# Patient Record
Sex: Male | Born: 1935 | Race: White | Hispanic: No | Marital: Married | Smoking: Former smoker
Health system: Southern US, Community
[De-identification: ages and names within clinical notes are randomized; demographics above are authoritative.]

## PROBLEM LIST (undated history)

## (undated) DIAGNOSIS — C4490 Unspecified malignant neoplasm of skin, unspecified: Secondary | ICD-10-CM

## (undated) DIAGNOSIS — L02419 Cutaneous abscess of limb, unspecified: Secondary | ICD-10-CM

## (undated) DIAGNOSIS — Z923 Personal history of irradiation: Secondary | ICD-10-CM

## (undated) DIAGNOSIS — E119 Type 2 diabetes mellitus without complications: Secondary | ICD-10-CM

## (undated) DIAGNOSIS — M109 Gout, unspecified: Secondary | ICD-10-CM

## (undated) DIAGNOSIS — R5383 Other fatigue: Secondary | ICD-10-CM

## (undated) DIAGNOSIS — K219 Gastro-esophageal reflux disease without esophagitis: Secondary | ICD-10-CM

## (undated) DIAGNOSIS — Z86010 Personal history of colonic polyps: Secondary | ICD-10-CM

## (undated) DIAGNOSIS — I872 Venous insufficiency (chronic) (peripheral): Secondary | ICD-10-CM

## (undated) DIAGNOSIS — I495 Sick sinus syndrome: Secondary | ICD-10-CM

## (undated) DIAGNOSIS — L03119 Cellulitis of unspecified part of limb: Secondary | ICD-10-CM

## (undated) DIAGNOSIS — I8000 Phlebitis and thrombophlebitis of superficial vessels of unspecified lower extremity: Secondary | ICD-10-CM

## (undated) DIAGNOSIS — E663 Overweight: Secondary | ICD-10-CM

## (undated) DIAGNOSIS — Z7901 Long term (current) use of anticoagulants: Secondary | ICD-10-CM

## (undated) DIAGNOSIS — M549 Dorsalgia, unspecified: Secondary | ICD-10-CM

## (undated) DIAGNOSIS — I4891 Unspecified atrial fibrillation: Secondary | ICD-10-CM

## (undated) DIAGNOSIS — I4510 Unspecified right bundle-branch block: Secondary | ICD-10-CM

## (undated) DIAGNOSIS — K5790 Diverticulosis of intestine, part unspecified, without perforation or abscess without bleeding: Secondary | ICD-10-CM

## (undated) DIAGNOSIS — Z950 Presence of cardiac pacemaker: Secondary | ICD-10-CM

## (undated) DIAGNOSIS — I509 Heart failure, unspecified: Secondary | ICD-10-CM

## (undated) DIAGNOSIS — I10 Essential (primary) hypertension: Secondary | ICD-10-CM

## (undated) DIAGNOSIS — R55 Syncope and collapse: Secondary | ICD-10-CM

## (undated) DIAGNOSIS — K222 Esophageal obstruction: Secondary | ICD-10-CM

## (undated) HISTORY — PX: LUMBAR FUSION: SHX111

## (undated) HISTORY — DX: Cellulitis of unspecified part of limb: L02.419

## (undated) HISTORY — DX: Gastro-esophageal reflux disease without esophagitis: K21.9

## (undated) HISTORY — PX: OTHER SURGICAL HISTORY: SHX169

## (undated) HISTORY — DX: Long term (current) use of anticoagulants: Z79.01

## (undated) HISTORY — PX: SKIN BIOPSY: SHX1

## (undated) HISTORY — DX: Type 2 diabetes mellitus without complications: E11.9

## (undated) HISTORY — DX: Sick sinus syndrome: I49.5

## (undated) HISTORY — DX: Dorsalgia, unspecified: M54.9

## (undated) HISTORY — PX: LAMINECTOMY: SHX219

## (undated) HISTORY — DX: Essential (primary) hypertension: I10

## (undated) HISTORY — DX: Other fatigue: R53.83

## (undated) HISTORY — DX: Syncope and collapse: R55

## (undated) HISTORY — DX: Personal history of irradiation: Z92.3

## (undated) HISTORY — DX: Esophageal obstruction: K22.2

## (undated) HISTORY — PX: ROTATOR CUFF REPAIR: SHX139

## (undated) HISTORY — DX: Unspecified malignant neoplasm of skin, unspecified: C44.90

## (undated) HISTORY — DX: Overweight: E66.3

## (undated) HISTORY — DX: Unspecified right bundle-branch block: I45.10

## (undated) HISTORY — DX: Cellulitis of unspecified part of limb: L03.119

## (undated) HISTORY — DX: Gout, unspecified: M10.9

## (undated) HISTORY — DX: Diverticulosis of intestine, part unspecified, without perforation or abscess without bleeding: K57.90

## (undated) HISTORY — DX: Presence of cardiac pacemaker: Z95.0

## (undated) HISTORY — DX: Personal history of colonic polyps: Z86.010

## (undated) HISTORY — DX: Venous insufficiency (chronic) (peripheral): I87.2

## (undated) HISTORY — DX: Phlebitis and thrombophlebitis of superficial vessels of unspecified lower extremity: I80.00

## (undated) HISTORY — PX: EYE SURGERY: SHX253

## (undated) HISTORY — DX: Unspecified atrial fibrillation: I48.91

---

## 2000-04-30 ENCOUNTER — Encounter: Payer: Self-pay | Admitting: Emergency Medicine

## 2000-04-30 ENCOUNTER — Emergency Department (HOSPITAL_COMMUNITY): Admission: EM | Admit: 2000-04-30 | Discharge: 2000-04-30 | Payer: Self-pay | Admitting: Emergency Medicine

## 2000-12-02 ENCOUNTER — Encounter: Payer: Self-pay | Admitting: *Deleted

## 2000-12-02 ENCOUNTER — Emergency Department (HOSPITAL_COMMUNITY): Admission: EM | Admit: 2000-12-02 | Discharge: 2000-12-03 | Payer: Self-pay | Admitting: *Deleted

## 2002-07-14 ENCOUNTER — Encounter: Payer: Self-pay | Admitting: Emergency Medicine

## 2002-07-14 ENCOUNTER — Emergency Department (HOSPITAL_COMMUNITY): Admission: EM | Admit: 2002-07-14 | Discharge: 2002-07-14 | Payer: Self-pay | Admitting: Emergency Medicine

## 2002-07-16 ENCOUNTER — Encounter: Payer: Self-pay | Admitting: Gastroenterology

## 2002-07-16 ENCOUNTER — Ambulatory Visit (HOSPITAL_COMMUNITY): Admission: RE | Admit: 2002-07-16 | Discharge: 2002-07-16 | Payer: Self-pay | Admitting: Gastroenterology

## 2003-06-25 ENCOUNTER — Encounter: Payer: Self-pay | Admitting: Emergency Medicine

## 2003-06-25 ENCOUNTER — Emergency Department (HOSPITAL_COMMUNITY): Admission: EM | Admit: 2003-06-25 | Discharge: 2003-06-26 | Payer: Self-pay | Admitting: Emergency Medicine

## 2004-03-01 ENCOUNTER — Ambulatory Visit (HOSPITAL_COMMUNITY): Admission: RE | Admit: 2004-03-01 | Discharge: 2004-03-01 | Payer: Self-pay | Admitting: Internal Medicine

## 2004-03-30 ENCOUNTER — Observation Stay (HOSPITAL_COMMUNITY): Admission: EM | Admit: 2004-03-30 | Discharge: 2004-03-31 | Payer: Self-pay | Admitting: Emergency Medicine

## 2004-06-24 ENCOUNTER — Inpatient Hospital Stay (HOSPITAL_COMMUNITY): Admission: RE | Admit: 2004-06-24 | Discharge: 2004-06-28 | Payer: Self-pay | Admitting: Neurosurgery

## 2004-07-05 ENCOUNTER — Inpatient Hospital Stay (HOSPITAL_COMMUNITY): Admission: AD | Admit: 2004-07-05 | Discharge: 2004-07-20 | Payer: Self-pay | Admitting: Neurosurgery

## 2004-07-13 ENCOUNTER — Ambulatory Visit: Payer: Self-pay | Admitting: Infectious Diseases

## 2004-07-24 ENCOUNTER — Inpatient Hospital Stay (HOSPITAL_COMMUNITY): Admission: EM | Admit: 2004-07-24 | Discharge: 2004-08-03 | Payer: Self-pay | Admitting: Emergency Medicine

## 2004-07-27 ENCOUNTER — Encounter: Payer: Self-pay | Admitting: Cardiology

## 2004-07-29 ENCOUNTER — Ambulatory Visit: Payer: Self-pay | Admitting: Physical Medicine & Rehabilitation

## 2004-10-01 ENCOUNTER — Ambulatory Visit: Payer: Self-pay | Admitting: Internal Medicine

## 2004-10-15 ENCOUNTER — Ambulatory Visit: Payer: Self-pay | Admitting: Internal Medicine

## 2004-10-20 ENCOUNTER — Ambulatory Visit: Payer: Self-pay | Admitting: Internal Medicine

## 2004-12-07 ENCOUNTER — Ambulatory Visit: Payer: Self-pay | Admitting: Internal Medicine

## 2004-12-21 ENCOUNTER — Ambulatory Visit: Payer: Self-pay | Admitting: Internal Medicine

## 2004-12-23 ENCOUNTER — Ambulatory Visit: Payer: Self-pay | Admitting: Internal Medicine

## 2005-01-04 ENCOUNTER — Ambulatory Visit: Payer: Self-pay | Admitting: Internal Medicine

## 2005-06-10 ENCOUNTER — Ambulatory Visit: Payer: Self-pay | Admitting: Internal Medicine

## 2005-06-16 ENCOUNTER — Encounter: Admission: RE | Admit: 2005-06-16 | Discharge: 2005-07-28 | Payer: Self-pay | Admitting: Internal Medicine

## 2005-07-02 ENCOUNTER — Ambulatory Visit: Payer: Self-pay | Admitting: Internal Medicine

## 2005-07-02 ENCOUNTER — Inpatient Hospital Stay (HOSPITAL_COMMUNITY): Admission: EM | Admit: 2005-07-02 | Discharge: 2005-07-06 | Payer: Self-pay | Admitting: Family Medicine

## 2005-07-07 ENCOUNTER — Ambulatory Visit: Payer: Self-pay | Admitting: Internal Medicine

## 2005-07-12 ENCOUNTER — Ambulatory Visit: Payer: Self-pay | Admitting: Internal Medicine

## 2005-07-20 ENCOUNTER — Ambulatory Visit: Payer: Self-pay | Admitting: Internal Medicine

## 2005-07-26 ENCOUNTER — Ambulatory Visit: Payer: Self-pay | Admitting: Internal Medicine

## 2005-07-28 ENCOUNTER — Ambulatory Visit: Payer: Self-pay | Admitting: Internal Medicine

## 2005-08-09 ENCOUNTER — Ambulatory Visit: Payer: Self-pay | Admitting: Internal Medicine

## 2005-08-24 ENCOUNTER — Ambulatory Visit: Payer: Self-pay | Admitting: Internal Medicine

## 2005-09-22 ENCOUNTER — Ambulatory Visit: Payer: Self-pay | Admitting: Internal Medicine

## 2005-09-30 ENCOUNTER — Ambulatory Visit (HOSPITAL_COMMUNITY): Admission: RE | Admit: 2005-09-30 | Discharge: 2005-09-30 | Payer: Self-pay | Admitting: Internal Medicine

## 2005-09-30 ENCOUNTER — Ambulatory Visit: Payer: Self-pay | Admitting: Internal Medicine

## 2005-10-03 ENCOUNTER — Ambulatory Visit: Payer: Self-pay | Admitting: Internal Medicine

## 2005-10-07 ENCOUNTER — Ambulatory Visit: Payer: Self-pay | Admitting: Internal Medicine

## 2005-10-10 ENCOUNTER — Ambulatory Visit: Payer: Self-pay | Admitting: Internal Medicine

## 2005-10-11 ENCOUNTER — Ambulatory Visit: Payer: Self-pay | Admitting: Internal Medicine

## 2005-10-12 ENCOUNTER — Ambulatory Visit: Payer: Self-pay | Admitting: Internal Medicine

## 2005-10-18 ENCOUNTER — Ambulatory Visit: Payer: Self-pay | Admitting: Internal Medicine

## 2005-10-26 ENCOUNTER — Ambulatory Visit: Payer: Self-pay | Admitting: Internal Medicine

## 2005-12-15 ENCOUNTER — Ambulatory Visit: Payer: Self-pay | Admitting: Internal Medicine

## 2006-01-05 ENCOUNTER — Ambulatory Visit: Payer: Self-pay | Admitting: Internal Medicine

## 2006-01-27 ENCOUNTER — Ambulatory Visit: Payer: Self-pay | Admitting: Internal Medicine

## 2006-03-22 ENCOUNTER — Encounter: Payer: Self-pay | Admitting: Emergency Medicine

## 2006-04-20 ENCOUNTER — Ambulatory Visit: Payer: Self-pay | Admitting: Internal Medicine

## 2006-04-27 ENCOUNTER — Ambulatory Visit: Payer: Self-pay | Admitting: Internal Medicine

## 2006-05-12 ENCOUNTER — Ambulatory Visit: Payer: Self-pay | Admitting: Internal Medicine

## 2006-05-16 ENCOUNTER — Ambulatory Visit: Payer: Self-pay | Admitting: Internal Medicine

## 2006-05-21 DIAGNOSIS — Z860100 Personal history of colon polyps, unspecified: Secondary | ICD-10-CM

## 2006-05-21 DIAGNOSIS — K5790 Diverticulosis of intestine, part unspecified, without perforation or abscess without bleeding: Secondary | ICD-10-CM

## 2006-05-21 HISTORY — DX: Personal history of colonic polyps: Z86.010

## 2006-05-21 HISTORY — DX: Personal history of colon polyps, unspecified: Z86.0100

## 2006-05-21 HISTORY — DX: Diverticulosis of intestine, part unspecified, without perforation or abscess without bleeding: K57.90

## 2006-05-25 ENCOUNTER — Ambulatory Visit: Payer: Self-pay | Admitting: Internal Medicine

## 2006-05-26 ENCOUNTER — Ambulatory Visit: Payer: Self-pay | Admitting: Gastroenterology

## 2006-06-01 ENCOUNTER — Ambulatory Visit: Payer: Self-pay | Admitting: Gastroenterology

## 2006-06-01 ENCOUNTER — Encounter (INDEPENDENT_AMBULATORY_CARE_PROVIDER_SITE_OTHER): Payer: Self-pay | Admitting: Specialist

## 2006-06-01 ENCOUNTER — Encounter (INDEPENDENT_AMBULATORY_CARE_PROVIDER_SITE_OTHER): Payer: Self-pay | Admitting: *Deleted

## 2006-06-23 ENCOUNTER — Ambulatory Visit: Payer: Self-pay | Admitting: Internal Medicine

## 2006-07-21 ENCOUNTER — Ambulatory Visit: Payer: Self-pay | Admitting: Internal Medicine

## 2006-07-27 ENCOUNTER — Ambulatory Visit: Payer: Self-pay | Admitting: Internal Medicine

## 2006-10-26 ENCOUNTER — Ambulatory Visit: Payer: Self-pay | Admitting: Internal Medicine

## 2006-10-26 LAB — CONVERTED CEMR LAB
ALT: 26 units/L (ref 0–40)
AST: 23 units/L (ref 0–37)
Albumin: 3.6 g/dL (ref 3.5–5.2)
Alkaline Phosphatase: 97 units/L (ref 39–117)
Basophils Absolute: 0 10*3/uL (ref 0.0–0.1)
CO2: 29 meq/L (ref 19–32)
Calcium: 9.1 mg/dL (ref 8.4–10.5)
Chloride: 93 meq/L — ABNORMAL LOW (ref 96–112)
GFR calc non Af Amer: 58 mL/min
Glucose, Bld: 358 mg/dL — ABNORMAL HIGH (ref 70–99)
Lymphocytes Relative: 18.9 % (ref 12.0–46.0)
MCHC: 34.5 g/dL (ref 30.0–36.0)
MCV: 98.3 fL (ref 78.0–100.0)
Monocytes Absolute: 0.6 10*3/uL (ref 0.2–0.7)
Monocytes Relative: 6.8 % (ref 3.0–11.0)
Neutro Abs: 5.8 10*3/uL (ref 1.4–7.7)
Platelets: 170 10*3/uL (ref 150–400)
RDW: 12.7 % (ref 11.5–14.6)
Total Bilirubin: 1.9 mg/dL — ABNORMAL HIGH (ref 0.3–1.2)

## 2006-10-30 ENCOUNTER — Ambulatory Visit: Payer: Self-pay | Admitting: Internal Medicine

## 2006-12-17 ENCOUNTER — Inpatient Hospital Stay (HOSPITAL_COMMUNITY): Admission: EM | Admit: 2006-12-17 | Discharge: 2006-12-21 | Payer: Self-pay | Admitting: Emergency Medicine

## 2006-12-17 ENCOUNTER — Ambulatory Visit: Payer: Self-pay | Admitting: Cardiology

## 2006-12-18 ENCOUNTER — Ambulatory Visit: Payer: Self-pay | Admitting: Internal Medicine

## 2006-12-18 ENCOUNTER — Encounter: Payer: Self-pay | Admitting: Cardiology

## 2006-12-26 ENCOUNTER — Ambulatory Visit: Payer: Self-pay | Admitting: Internal Medicine

## 2006-12-26 LAB — CONVERTED CEMR LAB
BUN: 16 mg/dL (ref 6–23)
CO2: 28 meq/L (ref 19–32)
Calcium: 9.6 mg/dL (ref 8.4–10.5)
Chloride: 95 meq/L — ABNORMAL LOW (ref 96–112)
Creatinine, Ser: 1 mg/dL (ref 0.4–1.5)
Glucose, Bld: 298 mg/dL — ABNORMAL HIGH (ref 70–99)

## 2006-12-29 ENCOUNTER — Ambulatory Visit: Payer: Self-pay | Admitting: *Deleted

## 2007-01-01 ENCOUNTER — Ambulatory Visit: Payer: Self-pay

## 2007-01-03 ENCOUNTER — Ambulatory Visit (HOSPITAL_COMMUNITY): Admission: RE | Admit: 2007-01-03 | Discharge: 2007-01-03 | Payer: Self-pay | Admitting: Otolaryngology

## 2007-01-24 ENCOUNTER — Ambulatory Visit: Payer: Self-pay | Admitting: Internal Medicine

## 2007-02-06 ENCOUNTER — Encounter: Admission: RE | Admit: 2007-02-06 | Discharge: 2007-02-06 | Payer: Self-pay | Admitting: Otolaryngology

## 2007-02-14 ENCOUNTER — Ambulatory Visit: Payer: Self-pay | Admitting: Internal Medicine

## 2007-03-19 ENCOUNTER — Ambulatory Visit: Payer: Self-pay | Admitting: Internal Medicine

## 2007-04-06 ENCOUNTER — Ambulatory Visit: Payer: Self-pay | Admitting: Internal Medicine

## 2007-04-10 ENCOUNTER — Ambulatory Visit: Payer: Self-pay | Admitting: Internal Medicine

## 2007-04-13 ENCOUNTER — Ambulatory Visit: Payer: Self-pay | Admitting: Internal Medicine

## 2007-04-18 ENCOUNTER — Ambulatory Visit: Payer: Self-pay | Admitting: Internal Medicine

## 2007-04-18 LAB — CONVERTED CEMR LAB
BUN: 9 mg/dL (ref 6–23)
Calcium: 9 mg/dL (ref 8.4–10.5)
Chloride: 94 meq/L — ABNORMAL LOW (ref 96–112)
GFR calc Af Amer: 70 mL/min
Glucose, Bld: 284 mg/dL — ABNORMAL HIGH (ref 70–99)
Sodium: 131 meq/L — ABNORMAL LOW (ref 135–145)

## 2007-04-19 ENCOUNTER — Ambulatory Visit: Payer: Self-pay | Admitting: Internal Medicine

## 2007-05-18 ENCOUNTER — Ambulatory Visit: Payer: Self-pay | Admitting: Internal Medicine

## 2007-05-23 ENCOUNTER — Ambulatory Visit: Payer: Self-pay | Admitting: Internal Medicine

## 2007-06-11 ENCOUNTER — Telehealth: Payer: Self-pay | Admitting: *Deleted

## 2007-06-26 DIAGNOSIS — I4891 Unspecified atrial fibrillation: Secondary | ICD-10-CM | POA: Insufficient documentation

## 2007-06-26 DIAGNOSIS — I10 Essential (primary) hypertension: Secondary | ICD-10-CM

## 2007-06-26 DIAGNOSIS — M109 Gout, unspecified: Secondary | ICD-10-CM

## 2007-06-26 DIAGNOSIS — I5032 Chronic diastolic (congestive) heart failure: Secondary | ICD-10-CM

## 2007-07-02 ENCOUNTER — Ambulatory Visit: Payer: Self-pay | Admitting: Internal Medicine

## 2007-07-02 DIAGNOSIS — I872 Venous insufficiency (chronic) (peripheral): Secondary | ICD-10-CM | POA: Insufficient documentation

## 2007-07-24 ENCOUNTER — Ambulatory Visit: Payer: Self-pay | Admitting: Internal Medicine

## 2007-07-24 DIAGNOSIS — E119 Type 2 diabetes mellitus without complications: Secondary | ICD-10-CM | POA: Insufficient documentation

## 2007-07-25 LAB — CONVERTED CEMR LAB
Calcium: 8.6 mg/dL (ref 8.4–10.5)
Chloride: 96 meq/L (ref 96–112)
Potassium: 4.6 meq/L (ref 3.5–5.1)

## 2007-07-31 ENCOUNTER — Telehealth: Payer: Self-pay | Admitting: *Deleted

## 2007-08-01 ENCOUNTER — Ambulatory Visit: Payer: Self-pay | Admitting: Internal Medicine

## 2007-08-01 LAB — CONVERTED CEMR LAB
Basophils Absolute: 0 10*3/uL (ref 0.0–0.1)
Chloride: 95 meq/L — ABNORMAL LOW (ref 96–112)
Glucose, Bld: 351 mg/dL — ABNORMAL HIGH (ref 70–99)
Hemoglobin: 14.5 g/dL (ref 13.0–17.0)
MCV: 98.3 fL (ref 78.0–100.0)
Neutro Abs: 4.9 10*3/uL (ref 1.4–7.7)
Potassium: 4.3 meq/L (ref 3.5–5.1)
RBC: 4.15 M/uL — ABNORMAL LOW (ref 4.22–5.81)
RDW: 13.4 % (ref 11.5–14.6)
Sodium: 136 meq/L (ref 135–145)

## 2007-08-07 ENCOUNTER — Encounter: Payer: Self-pay | Admitting: Internal Medicine

## 2007-09-03 ENCOUNTER — Ambulatory Visit: Payer: Self-pay | Admitting: Internal Medicine

## 2007-09-06 ENCOUNTER — Telehealth: Payer: Self-pay | Admitting: Internal Medicine

## 2007-09-10 ENCOUNTER — Ambulatory Visit: Payer: Self-pay | Admitting: Internal Medicine

## 2007-09-10 ENCOUNTER — Ambulatory Visit: Payer: Self-pay

## 2007-09-10 ENCOUNTER — Encounter: Payer: Self-pay | Admitting: Internal Medicine

## 2007-09-27 ENCOUNTER — Ambulatory Visit: Payer: Self-pay | Admitting: Cardiology

## 2007-10-03 ENCOUNTER — Ambulatory Visit: Payer: Self-pay | Admitting: Internal Medicine

## 2007-10-08 ENCOUNTER — Telehealth: Payer: Self-pay | Admitting: Internal Medicine

## 2007-10-11 ENCOUNTER — Ambulatory Visit: Payer: Self-pay | Admitting: Internal Medicine

## 2007-10-11 LAB — CONVERTED CEMR LAB: INR: 1.5

## 2007-10-31 ENCOUNTER — Ambulatory Visit: Payer: Self-pay | Admitting: Internal Medicine

## 2007-10-31 DIAGNOSIS — M549 Dorsalgia, unspecified: Secondary | ICD-10-CM | POA: Insufficient documentation

## 2007-11-26 ENCOUNTER — Telehealth: Payer: Self-pay | Admitting: Internal Medicine

## 2007-11-26 ENCOUNTER — Telehealth (INDEPENDENT_AMBULATORY_CARE_PROVIDER_SITE_OTHER): Payer: Self-pay | Admitting: *Deleted

## 2007-11-27 ENCOUNTER — Encounter: Payer: Self-pay | Admitting: Internal Medicine

## 2007-11-29 ENCOUNTER — Encounter: Payer: Self-pay | Admitting: Internal Medicine

## 2007-12-06 ENCOUNTER — Ambulatory Visit: Payer: Self-pay | Admitting: Internal Medicine

## 2007-12-11 LAB — CONVERTED CEMR LAB
Calcium: 9.3 mg/dL (ref 8.4–10.5)
Chloride: 92 meq/L — ABNORMAL LOW (ref 96–112)
GFR calc Af Amer: 85 mL/min
Glucose, Bld: 525 mg/dL (ref 70–99)
Potassium: 5.2 meq/L — ABNORMAL HIGH (ref 3.5–5.1)
Sodium: 132 meq/L — ABNORMAL LOW (ref 135–145)

## 2007-12-26 ENCOUNTER — Ambulatory Visit: Payer: Self-pay | Admitting: Internal Medicine

## 2007-12-26 ENCOUNTER — Telehealth: Payer: Self-pay | Admitting: Internal Medicine

## 2007-12-27 ENCOUNTER — Ambulatory Visit: Payer: Self-pay | Admitting: Internal Medicine

## 2007-12-27 LAB — CONVERTED CEMR LAB
GFR calc non Af Amer: 64 mL/min
Glucose, Bld: 455 mg/dL — ABNORMAL HIGH (ref 70–99)
Hgb A1c MFr Bld: 12.6 % — ABNORMAL HIGH (ref 4.6–6.0)
Potassium: 4.8 meq/L (ref 3.5–5.1)
Sodium: 133 meq/L — ABNORMAL LOW (ref 135–145)

## 2007-12-31 ENCOUNTER — Telehealth: Payer: Self-pay | Admitting: Internal Medicine

## 2008-01-04 ENCOUNTER — Telehealth: Payer: Self-pay | Admitting: Internal Medicine

## 2008-01-10 ENCOUNTER — Telehealth: Payer: Self-pay | Admitting: Internal Medicine

## 2008-01-16 ENCOUNTER — Telehealth: Payer: Self-pay | Admitting: Internal Medicine

## 2008-01-18 ENCOUNTER — Inpatient Hospital Stay (HOSPITAL_COMMUNITY): Admission: EM | Admit: 2008-01-18 | Discharge: 2008-01-22 | Payer: Self-pay | Admitting: Emergency Medicine

## 2008-01-18 ENCOUNTER — Ambulatory Visit: Payer: Self-pay | Admitting: Internal Medicine

## 2008-01-25 ENCOUNTER — Ambulatory Visit: Payer: Self-pay | Admitting: Internal Medicine

## 2008-02-01 ENCOUNTER — Telehealth: Payer: Self-pay | Admitting: Internal Medicine

## 2008-02-06 ENCOUNTER — Ambulatory Visit: Payer: Self-pay | Admitting: Cardiology

## 2008-02-08 ENCOUNTER — Telehealth: Payer: Self-pay | Admitting: Internal Medicine

## 2008-02-13 ENCOUNTER — Telehealth (INDEPENDENT_AMBULATORY_CARE_PROVIDER_SITE_OTHER): Payer: Self-pay | Admitting: *Deleted

## 2008-02-18 ENCOUNTER — Ambulatory Visit: Payer: Self-pay | Admitting: Internal Medicine

## 2008-02-18 LAB — CONVERTED CEMR LAB
BUN: 12 mg/dL (ref 6–23)
Calcium: 9.1 mg/dL (ref 8.4–10.5)
Creatinine, Ser: 0.8 mg/dL (ref 0.4–1.5)
GFR calc Af Amer: 123 mL/min
GFR calc non Af Amer: 102 mL/min
Glucose, Bld: 221 mg/dL — ABNORMAL HIGH (ref 70–99)
Potassium: 4.2 meq/L (ref 3.5–5.1)

## 2008-02-20 LAB — HM DIABETES EYE EXAM

## 2008-02-27 ENCOUNTER — Telehealth: Payer: Self-pay | Admitting: Internal Medicine

## 2008-02-28 ENCOUNTER — Encounter: Payer: Self-pay | Admitting: Internal Medicine

## 2008-02-28 ENCOUNTER — Ambulatory Visit: Payer: Self-pay | Admitting: Internal Medicine

## 2008-02-28 DIAGNOSIS — I8000 Phlebitis and thrombophlebitis of superficial vessels of unspecified lower extremity: Secondary | ICD-10-CM | POA: Insufficient documentation

## 2008-02-28 DIAGNOSIS — L02419 Cutaneous abscess of limb, unspecified: Secondary | ICD-10-CM

## 2008-02-28 DIAGNOSIS — L03119 Cellulitis of unspecified part of limb: Secondary | ICD-10-CM

## 2008-02-28 LAB — CONVERTED CEMR LAB
BUN: 16 mg/dL (ref 6–23)
CO2: 33 meq/L — ABNORMAL HIGH (ref 19–32)
Calcium: 8.9 mg/dL (ref 8.4–10.5)
Chloride: 97 meq/L (ref 96–112)
Creatinine, Ser: 0.8 mg/dL (ref 0.4–1.5)
Glucose, Bld: 201 mg/dL — ABNORMAL HIGH (ref 70–99)
Hgb A1c MFr Bld: 9.1 % — ABNORMAL HIGH (ref 4.6–6.0)

## 2008-03-14 ENCOUNTER — Ambulatory Visit: Payer: Self-pay | Admitting: Internal Medicine

## 2008-04-15 ENCOUNTER — Ambulatory Visit: Payer: Self-pay | Admitting: Internal Medicine

## 2008-04-15 LAB — CONVERTED CEMR LAB
BUN: 12 mg/dL (ref 6–23)
Calcium: 9.2 mg/dL (ref 8.4–10.5)
Eosinophils Absolute: 0.1 10*3/uL (ref 0.0–0.7)
GFR calc Af Amer: 95 mL/min
GFR calc non Af Amer: 79 mL/min
Glucose, Bld: 290 mg/dL — ABNORMAL HIGH (ref 70–99)
HCT: 40.8 % (ref 39.0–52.0)
Hemoglobin: 14.1 g/dL (ref 13.0–17.0)
MCV: 95.2 fL (ref 78.0–100.0)
Monocytes Absolute: 0.5 10*3/uL (ref 0.1–1.0)
Monocytes Relative: 5.8 % (ref 3.0–12.0)
Neutro Abs: 6.1 10*3/uL (ref 1.4–7.7)
Platelets: 164 10*3/uL (ref 150–400)
RDW: 13.4 % (ref 11.5–14.6)
Sodium: 138 meq/L (ref 135–145)

## 2008-05-02 ENCOUNTER — Ambulatory Visit: Payer: Self-pay | Admitting: Internal Medicine

## 2008-05-19 ENCOUNTER — Telehealth (INDEPENDENT_AMBULATORY_CARE_PROVIDER_SITE_OTHER): Payer: Self-pay | Admitting: *Deleted

## 2008-05-21 ENCOUNTER — Telehealth: Payer: Self-pay | Admitting: Internal Medicine

## 2008-05-22 ENCOUNTER — Ambulatory Visit: Payer: Self-pay | Admitting: Internal Medicine

## 2008-05-22 LAB — CONVERTED CEMR LAB
BUN: 9 mg/dL (ref 6–23)
Creatinine, Ser: 0.9 mg/dL (ref 0.4–1.5)
GFR calc Af Amer: 107 mL/min
GFR calc non Af Amer: 89 mL/min
Potassium: 3 meq/L — ABNORMAL LOW (ref 3.5–5.1)

## 2008-06-16 ENCOUNTER — Ambulatory Visit: Payer: Self-pay | Admitting: Internal Medicine

## 2008-08-04 ENCOUNTER — Telehealth: Payer: Self-pay | Admitting: Internal Medicine

## 2008-08-13 ENCOUNTER — Ambulatory Visit: Payer: Self-pay | Admitting: Internal Medicine

## 2008-08-13 LAB — CONVERTED CEMR LAB
BUN: 10 mg/dL (ref 6–23)
Calcium: 9.1 mg/dL (ref 8.4–10.5)
GFR calc Af Amer: 107 mL/min
Glucose, Bld: 233 mg/dL — ABNORMAL HIGH (ref 70–99)
Prothrombin Time: 17.7 s

## 2008-08-22 ENCOUNTER — Ambulatory Visit: Payer: Self-pay | Admitting: Internal Medicine

## 2008-10-20 ENCOUNTER — Telehealth: Payer: Self-pay | Admitting: Internal Medicine

## 2008-10-30 ENCOUNTER — Telehealth: Payer: Self-pay | Admitting: Internal Medicine

## 2008-11-11 ENCOUNTER — Ambulatory Visit: Payer: Self-pay | Admitting: Internal Medicine

## 2009-01-20 ENCOUNTER — Ambulatory Visit: Payer: Self-pay | Admitting: Internal Medicine

## 2009-01-21 ENCOUNTER — Encounter: Payer: Self-pay | Admitting: Internal Medicine

## 2009-01-22 ENCOUNTER — Telehealth: Payer: Self-pay | Admitting: Internal Medicine

## 2009-01-27 ENCOUNTER — Encounter: Admission: RE | Admit: 2009-01-27 | Discharge: 2009-03-04 | Payer: Self-pay | Admitting: Neurology

## 2009-01-30 ENCOUNTER — Ambulatory Visit: Payer: Self-pay | Admitting: Internal Medicine

## 2009-01-30 LAB — CONVERTED CEMR LAB
BUN: 10 mg/dL (ref 6–23)
Calcium: 9.2 mg/dL (ref 8.4–10.5)
Creatinine, Ser: 0.7 mg/dL (ref 0.4–1.5)
GFR calc Af Amer: 143 mL/min
Glucose, Bld: 214 mg/dL — ABNORMAL HIGH (ref 70–99)

## 2009-02-02 ENCOUNTER — Encounter: Admission: RE | Admit: 2009-02-02 | Discharge: 2009-02-02 | Payer: Self-pay | Admitting: Neurology

## 2009-02-11 ENCOUNTER — Telehealth: Payer: Self-pay | Admitting: Internal Medicine

## 2009-03-09 ENCOUNTER — Telehealth: Payer: Self-pay | Admitting: Internal Medicine

## 2009-03-19 ENCOUNTER — Ambulatory Visit: Payer: Self-pay | Admitting: Internal Medicine

## 2009-04-07 DIAGNOSIS — I4510 Unspecified right bundle-branch block: Secondary | ICD-10-CM

## 2009-04-07 DIAGNOSIS — K219 Gastro-esophageal reflux disease without esophagitis: Secondary | ICD-10-CM | POA: Insufficient documentation

## 2009-04-07 DIAGNOSIS — R55 Syncope and collapse: Secondary | ICD-10-CM

## 2009-04-07 DIAGNOSIS — R5381 Other malaise: Secondary | ICD-10-CM | POA: Insufficient documentation

## 2009-04-07 DIAGNOSIS — E663 Overweight: Secondary | ICD-10-CM | POA: Insufficient documentation

## 2009-04-07 DIAGNOSIS — R5383 Other fatigue: Secondary | ICD-10-CM

## 2009-04-10 ENCOUNTER — Ambulatory Visit: Payer: Self-pay | Admitting: Internal Medicine

## 2009-04-17 ENCOUNTER — Ambulatory Visit: Payer: Self-pay | Admitting: Internal Medicine

## 2009-04-30 ENCOUNTER — Ambulatory Visit: Payer: Self-pay | Admitting: Internal Medicine

## 2009-05-01 ENCOUNTER — Ambulatory Visit: Payer: Self-pay | Admitting: Internal Medicine

## 2009-05-01 LAB — CONVERTED CEMR LAB: INR: 2

## 2009-05-06 ENCOUNTER — Encounter: Payer: Self-pay | Admitting: Internal Medicine

## 2009-05-06 ENCOUNTER — Telehealth (INDEPENDENT_AMBULATORY_CARE_PROVIDER_SITE_OTHER): Payer: Self-pay | Admitting: *Deleted

## 2009-05-28 ENCOUNTER — Telehealth: Payer: Self-pay | Admitting: Internal Medicine

## 2009-06-03 ENCOUNTER — Ambulatory Visit: Payer: Self-pay | Admitting: Internal Medicine

## 2009-06-03 ENCOUNTER — Ambulatory Visit (HOSPITAL_COMMUNITY): Admission: RE | Admit: 2009-06-03 | Discharge: 2009-06-03 | Payer: Self-pay | Admitting: Cardiology

## 2009-06-08 ENCOUNTER — Telehealth (INDEPENDENT_AMBULATORY_CARE_PROVIDER_SITE_OTHER): Payer: Self-pay | Admitting: *Deleted

## 2009-06-17 ENCOUNTER — Ambulatory Visit: Payer: Self-pay

## 2009-07-20 ENCOUNTER — Ambulatory Visit: Payer: Self-pay | Admitting: Internal Medicine

## 2009-08-05 ENCOUNTER — Encounter: Payer: Self-pay | Admitting: Internal Medicine

## 2009-08-17 ENCOUNTER — Telehealth: Payer: Self-pay | Admitting: Internal Medicine

## 2009-08-27 ENCOUNTER — Ambulatory Visit: Payer: Self-pay | Admitting: Internal Medicine

## 2009-08-28 ENCOUNTER — Ambulatory Visit: Payer: Self-pay | Admitting: Internal Medicine

## 2009-08-28 DIAGNOSIS — S139XXA Sprain of joints and ligaments of unspecified parts of neck, initial encounter: Secondary | ICD-10-CM

## 2009-09-03 ENCOUNTER — Ambulatory Visit: Payer: Self-pay | Admitting: Internal Medicine

## 2009-09-03 LAB — CONVERTED CEMR LAB
INR: 1.9
Prothrombin Time: 17 s

## 2009-10-21 ENCOUNTER — Ambulatory Visit: Payer: Self-pay | Admitting: Internal Medicine

## 2009-10-21 LAB — CONVERTED CEMR LAB: Prothrombin Time: 16.7 s

## 2009-11-18 ENCOUNTER — Ambulatory Visit: Payer: Self-pay | Admitting: Internal Medicine

## 2009-11-18 LAB — CONVERTED CEMR LAB: Prothrombin Time: 15.6 s

## 2009-11-23 ENCOUNTER — Telehealth: Payer: Self-pay | Admitting: Internal Medicine

## 2009-12-21 ENCOUNTER — Ambulatory Visit: Payer: Self-pay | Admitting: Internal Medicine

## 2009-12-21 DIAGNOSIS — M720 Palmar fascial fibromatosis [Dupuytren]: Secondary | ICD-10-CM | POA: Insufficient documentation

## 2009-12-21 LAB — CONVERTED CEMR LAB: CRP, High Sensitivity: 10.1 — ABNORMAL HIGH (ref 0.00–5.00)

## 2009-12-22 ENCOUNTER — Telehealth: Payer: Self-pay | Admitting: Internal Medicine

## 2010-02-02 ENCOUNTER — Telehealth: Payer: Self-pay | Admitting: Internal Medicine

## 2010-03-03 ENCOUNTER — Ambulatory Visit: Payer: Self-pay | Admitting: Internal Medicine

## 2010-03-03 DIAGNOSIS — J069 Acute upper respiratory infection, unspecified: Secondary | ICD-10-CM | POA: Insufficient documentation

## 2010-03-03 LAB — CONVERTED CEMR LAB
BUN: 8 mg/dL (ref 6–23)
Basophils Relative: 0.3 % (ref 0.0–3.0)
CO2: 31 meq/L (ref 19–32)
Chloride: 95 meq/L — ABNORMAL LOW (ref 96–112)
Creatinine, Ser: 1.1 mg/dL (ref 0.4–1.5)
Eosinophils Absolute: 0.3 10*3/uL (ref 0.0–0.7)
HCT: 37.1 % — ABNORMAL LOW (ref 39.0–52.0)
Hemoglobin: 13.2 g/dL (ref 13.0–17.0)
Lymphocytes Relative: 20.9 % (ref 12.0–46.0)
Lymphs Abs: 1.3 10*3/uL (ref 0.7–4.0)
MCHC: 35.6 g/dL (ref 30.0–36.0)
Monocytes Relative: 7.6 % (ref 3.0–12.0)
Neutro Abs: 4.2 10*3/uL (ref 1.4–7.7)
RBC: 3.98 M/uL — ABNORMAL LOW (ref 4.22–5.81)

## 2010-03-10 ENCOUNTER — Telehealth: Payer: Self-pay | Admitting: Internal Medicine

## 2010-03-10 ENCOUNTER — Encounter (INDEPENDENT_AMBULATORY_CARE_PROVIDER_SITE_OTHER): Payer: Self-pay | Admitting: *Deleted

## 2010-04-08 ENCOUNTER — Telehealth: Payer: Self-pay | Admitting: Internal Medicine

## 2010-04-08 ENCOUNTER — Ambulatory Visit: Payer: Self-pay | Admitting: Gastroenterology

## 2010-04-08 DIAGNOSIS — R1310 Dysphagia, unspecified: Secondary | ICD-10-CM | POA: Insufficient documentation

## 2010-04-09 ENCOUNTER — Telehealth: Payer: Self-pay | Admitting: Internal Medicine

## 2010-05-07 ENCOUNTER — Telehealth: Payer: Self-pay | Admitting: Internal Medicine

## 2010-05-10 ENCOUNTER — Telehealth: Payer: Self-pay | Admitting: Internal Medicine

## 2010-05-10 ENCOUNTER — Ambulatory Visit: Payer: Self-pay | Admitting: Family Medicine

## 2010-05-11 ENCOUNTER — Telehealth (INDEPENDENT_AMBULATORY_CARE_PROVIDER_SITE_OTHER): Payer: Self-pay | Admitting: *Deleted

## 2010-05-11 LAB — CONVERTED CEMR LAB
BUN: 15 mg/dL (ref 6–23)
CO2: 32 meq/L (ref 19–32)
Calcium: 8.6 mg/dL (ref 8.4–10.5)
Creatinine, Ser: 1.3 mg/dL (ref 0.4–1.5)

## 2010-05-17 ENCOUNTER — Encounter: Payer: Self-pay | Admitting: Internal Medicine

## 2010-05-17 ENCOUNTER — Ambulatory Visit (HOSPITAL_COMMUNITY): Admission: RE | Admit: 2010-05-17 | Discharge: 2010-05-17 | Payer: Self-pay | Admitting: Gastroenterology

## 2010-05-17 ENCOUNTER — Ambulatory Visit: Payer: Self-pay | Admitting: Gastroenterology

## 2010-05-19 ENCOUNTER — Encounter (INDEPENDENT_AMBULATORY_CARE_PROVIDER_SITE_OTHER): Payer: Self-pay | Admitting: *Deleted

## 2010-05-19 ENCOUNTER — Telehealth: Payer: Self-pay | Admitting: Gastroenterology

## 2010-05-19 DIAGNOSIS — K222 Esophageal obstruction: Secondary | ICD-10-CM | POA: Insufficient documentation

## 2010-05-21 ENCOUNTER — Telehealth: Payer: Self-pay | Admitting: Internal Medicine

## 2010-05-21 ENCOUNTER — Encounter: Payer: Self-pay | Admitting: Internal Medicine

## 2010-06-01 ENCOUNTER — Telehealth: Payer: Self-pay | Admitting: Gastroenterology

## 2010-06-04 ENCOUNTER — Encounter: Payer: Self-pay | Admitting: Gastroenterology

## 2010-06-04 ENCOUNTER — Encounter (INDEPENDENT_AMBULATORY_CARE_PROVIDER_SITE_OTHER): Payer: Self-pay | Admitting: *Deleted

## 2010-06-04 ENCOUNTER — Encounter: Payer: Self-pay | Admitting: Internal Medicine

## 2010-06-04 ENCOUNTER — Ambulatory Visit (HOSPITAL_COMMUNITY): Admission: RE | Admit: 2010-06-04 | Discharge: 2010-06-04 | Payer: Self-pay | Admitting: Gastroenterology

## 2010-06-23 ENCOUNTER — Telehealth: Payer: Self-pay | Admitting: Internal Medicine

## 2010-06-25 ENCOUNTER — Ambulatory Visit (HOSPITAL_COMMUNITY): Admission: RE | Admit: 2010-06-25 | Discharge: 2010-06-25 | Payer: Self-pay | Admitting: Gastroenterology

## 2010-06-25 ENCOUNTER — Ambulatory Visit: Payer: Self-pay | Admitting: Gastroenterology

## 2010-06-28 ENCOUNTER — Ambulatory Visit: Payer: Self-pay | Admitting: Internal Medicine

## 2010-07-09 ENCOUNTER — Ambulatory Visit: Payer: Self-pay | Admitting: Internal Medicine

## 2010-08-12 ENCOUNTER — Ambulatory Visit: Payer: Self-pay | Admitting: Internal Medicine

## 2010-08-13 ENCOUNTER — Telehealth: Payer: Self-pay | Admitting: Internal Medicine

## 2010-08-30 ENCOUNTER — Ambulatory Visit: Payer: Self-pay | Admitting: Internal Medicine

## 2010-08-30 DIAGNOSIS — M7500 Adhesive capsulitis of unspecified shoulder: Secondary | ICD-10-CM

## 2010-08-30 LAB — CONVERTED CEMR LAB: INR: 1.9

## 2010-10-18 ENCOUNTER — Telehealth: Payer: Self-pay | Admitting: Internal Medicine

## 2010-10-19 ENCOUNTER — Ambulatory Visit: Payer: Self-pay | Admitting: Internal Medicine

## 2010-10-19 DIAGNOSIS — S81809A Unspecified open wound, unspecified lower leg, initial encounter: Secondary | ICD-10-CM | POA: Insufficient documentation

## 2010-10-19 LAB — CONVERTED CEMR LAB: INR: 1.7

## 2010-11-29 ENCOUNTER — Ambulatory Visit
Admission: RE | Admit: 2010-11-29 | Discharge: 2010-11-29 | Payer: Self-pay | Source: Home / Self Care | Attending: Internal Medicine | Admitting: Internal Medicine

## 2010-11-29 DIAGNOSIS — L570 Actinic keratosis: Secondary | ICD-10-CM | POA: Insufficient documentation

## 2010-12-04 DIAGNOSIS — I495 Sick sinus syndrome: Secondary | ICD-10-CM | POA: Insufficient documentation

## 2010-12-04 DIAGNOSIS — I4891 Unspecified atrial fibrillation: Secondary | ICD-10-CM

## 2010-12-21 NOTE — Letter (Signed)
Summary: Diabetic Instructions  [facility]  [address] [place], [place] [address]   Phone: [phone]  Fax: [phone]    [name] 1935-01-17 MRN: [id number]      ORAL DIABETIC MEDICATION INSTRUCTIONS  The day before your procedure:   Take your diabetic pill as you do normally  The day of your procedure:   Do not take your diabetic pill    We will check your blood sugar levels during the admission process and again in Recovery before discharging you home  ________________________________________________________________________     INSULIN (LONG ACTING) MEDICATION INSTRUCTIONS (LANTUS, NPH, 70/30, Humulin, Novolin-N)   The day before your procedure:   Take  your regular evening dose    The day of your procedure:   Do not take your morning dose       INSULIN (SHORT ACTING) MEDICATION INSTRUCTIONS (Regular, Humulog, NOVOLOG)   The day before your procedure:   Do not take your evening dose   The day of your procedure:   Do not take your morning dose   [name],   Please follow above instructions for your diabetic medications. If you have any questions please call me at [phone]. Thank You!   Laureen Ochs LPN  Appended Document: Diabetic Instructions Letter mailed to patient.

## 2010-12-21 NOTE — Procedures (Signed)
Summary: Upper Endoscopy  Patient: [name] Note: All result statuses are Final unless otherwise noted.  Tests: (1) Upper Endoscopy (EGD)   EGD Upper Endoscopy       DONE     [facility]     [address] [place], [place]  [address]           ENDOSCOPY PROCEDURE REPORT           PATIENT:  [name], [name]  MR#:  [id number]     BIRTHDATE:  07/20/1935, 74 yrs. old  GENDER:  male           ENDOSCOPIST:  Barbette Hair. Arlyce Dice, MD     Referred by:           PROCEDURE DATE:  06/04/2010     PROCEDURE:  EGD with balloon dilatation     ASA CLASS:  Class II     INDICATIONS:  dilation of esophageal stricture           MEDICATIONS:   Fentanyl 75 mcg, Versed 6 mg, glycopyrrolate     (Robinal) 0.2 mg     TOPICAL ANESTHETIC:  Cetacaine Spray           DESCRIPTION OF PROCEDURE:   After the risks benefits and     alternatives of the procedure were thoroughly explained, informed     consent was obtained.  The  endoscope was introduced through the     mouth and advanced to the third portion of the duodenum, without     limitations.  The instrument was slowly withdrawn as the mucosa     was fully examined.     <<PROCEDUREIMAGES>>           A stricture was found in the distal esophagus. Stricture at 38cm     from incisors balloon dilation 12.04-03-14 (see image3). Moderate     resistance; minimal heme  other findings in the bulb of the     duodenum (see image1). Pancreatic rest  Otherwise the examination     was normal.    Retroflexed views revealed no abnormalities.    The     scope was then withdrawn from the patient and the procedure     completed.           COMPLICATIONS:  None           ENDOSCOPIC IMPRESSION:     1) Stricture in the distal esophagus - s/p balloon dilitation     2) Other findings in the bulb of duodenum - pancreatic rest     3) Otherwise normal examination     RECOMMENDATIONS:     1) repeat dilitation 3 weeks           REPEAT EXAM:        ______________________________     Barbette Hair. Arlyce Dice, MD           CC:  Stacie Glaze, MD           n.     Rosalie Doctor:   Barbette Hair. Skarleth Delmonico at 06/04/2010 11:10 AM           [name], [id number]  Note: An exclamation mark (!) indicates a result that was not dispersed into the flowsheet. Document Creation Date: 06/04/2010 11:11 AM _______________________________________________________________________  (1) Order result status: Final Collection or observation date-time: 06/04/2010 11:05 Requested date-time:  Receipt date-time:  Reported date-time:  Referring Physician:   Ordering Physician: Melvia Heaps [phone])  Specimen Source:  Source: Launa Grill Order Number: [id number] Lab site:   Appended Document: Upper Endoscopy Repeat procedure scheduled at [facility] on 06-25-10 at 12:30pm. All instructions reviewed w/Mrs.Schorr by phone and I will mail a copy to his home. Pt. instructed to call back as needed.

## 2010-12-21 NOTE — Progress Notes (Signed)
Summary: refill  Phone Note Refill Request Call back at Home Phone [phone] Message from:  Patient---live call  Refills Requested: Medication #1:  OXYCODONE HCL 5 MG CAPS one by mouth three times a day please leave message on machine when ready.  Initial call taken by: Warnell Forester,  May 21, 2010 1:52 PM    Prescriptions: OXYCODONE HCL 5 MG CAPS (OXYCODONE HCL) one by mouth three times a day  #90 x 0   Entered by:   Willy Eddy, LPN   Authorized by:   Stacie Glaze MD   Signed by:   Willy Eddy, LPN on 09/81/1914   Method used:   Print then Give to Patient   RxID:   [id number]

## 2010-12-21 NOTE — Letter (Signed)
Summary: [facility]  [facility]   Imported By: Maryln Gottron 05/31/2010 11:19:19  _____________________________________________________________________  External Attachment:    Type:   Image     Comment:   External Document

## 2010-12-21 NOTE — Assessment & Plan Note (Signed)
Summary: possible gout in ankle/bmw   Vital Signs:  Patient profile:   76 year old male Height:      70 inches (177.80 cm) Weight:      198 pounds (90.00 kg) O2 Sat:      73 % on Room air Temp:     99.1 degrees F (37.28 degrees C) oral Pulse rate:   73 / minute BP sitting:   118 / 68  (left arm) Cuff size:   regular  Vitals Entered By: Josph Macho RMA (May 10, 2010 2:13 PM)  O2 Flow:  Room air CC: Possible gout in right wrist- wrist is swollen and fevered/ pt states he is no longer taking Celebrex/ CF Is Patient Diabetic? Yes   History of Present Illness: Patient in today for evaluation of right wrist pain/warmth/swelling. He denies any known trauma/falls. He reports a roughly 5 day history of R wrist pain. Initially it was only pain/stiffness. He called the office late last week and was started on Etodolac in replacement of his Celebrex. He took one dose Sat, 2 doses Sun and one dose today. He did not note any significant warmth or redness/swelling until yesterday (Sun) so last night he took some old Colchicine he had and he reports the redness and swelling persist but are improved today. He has had gout in the past but never in the wrists. He denies any recent illness/fevers/diarrhea/change in diet or hydration. He does not drink much alcohol but has completely avoided it for the past 4 days. He denies any URI signs/chills/CP/palp/SOB.  Current Medications (verified): 1)  Coumadin 5 Mg  Tabs (Warfarin Sodium) .... 2.86ml 2 Days A Week and 5 5 Days A Week 2)  Digoxin 0.125 Mg  Tabs (Digoxin) .... Take 1 Tablet By Mouth Once A Day 3)  Cardizem Cd 240 Mg  Cp24 (Diltiazem Hcl Coated Beads) .Marland Kitchen.. 1 Once Daily 4)  Lasix 40 Mg  Tabs (Furosemide) .... Take 1 Tablet By Mouth Once A Day 5)  Oxycontin 20 Mg Xr12h-Tab (Oxycodone Hcl) .... One By Mouth Two Times A Day Prn 6)  Glucophage 500 Mg  Tabs (Metformin Hcl) .... Two Times A Day 7)  Novolog 100 Unit/ml  Soln (Insulin Aspart) .... For  Sliding Scale Insulin If Our Cbg Is 300 of Greater Take 10 Units, 200-300 Use 5 Units and If Less Than 200 Do Not Use 8)  Lantus Solostar 100 Unit/ml  Soln (Insulin Glargine) .... 25-30 Units At Bedtime 9)  Doxazosin Mesylate 4 Mg  Tabs (Doxazosin Mesylate) .Marland Kitchen.. 1 Once Daily 10)  Colchicine 0.6 Mg Tabs (Colchicine) .... One By Mouth Daily As Needed 11)  Celebrex 200 Mg Caps (Celecoxib) .... One By Mouth Daily As Needed 12)  Oxycodone Hcl 5 Mg Caps (Oxycodone Hcl) .... One By Mouth Three Times A Day 13)  Prilosec 20 Mg Cpdr (Omeprazole) .Marland Kitchen.. 1 Two Times A Day 14)  Etodolac 400 Mg Tabs (Etodolac) .... One By Mouth Bid  Allergies (verified): 1)  * Morphine  Past History:  Past medical history reviewed for relevance to current acute and chronic problems. Social history (including risk factors) reviewed for relevance to current acute and chronic problems.  Past Medical History: Reviewed history from 04/05/2010 and no changes required. FATIGUE / MALAISE (ICD-780.79) OVERWEIGHT/OBESITY (ICD-278.02) SYNCOPE AND COLLAPSE (ICD-780.2) GERD (ICD-530.81) RBBB (ICD-426.4) VENOUS INSUFFICIENCY, CHRONIC (ICD-459.81) PHLEBITIS&THROMBOPHLEB SUP VESSELS LOWER EXTREM (ICD-451.0) CELLULITIS/ABSCESS, LEG (ICD-682.6) BACK PAIN, CHRONIC (ICD-724.5) COUMADIN THERAPY (ICD-V58.61) ENCOUNTER FOR THERAPEUTIC DRUG MONITORING (ICD-V58.83) DIABETES MELLITUS,  TYPE II (ICD-250.00) INSUFFICIENCY, VENOUS NOS (ICD-459.81) HYPERTENSION (ICD-401.9) GOUT (ICD-274.9) CONGESTIVE HEART FAILURE (ICD-428.0) ATRIAL FIBRILLATION (ICD-427.31) Esophageal Stricture colon polyps Diverticulosis  Social History: Reviewed history from 04/08/2010 and no changes required. Retired Married Former Smoker Alcohol Use - yes occasional Daily Caffeine Use: 2 daily  Illicit Drug Use - no  Review of Systems      See HPI  Physical Exam  General:  Well-developed,well-nourished,in no acute distress; alert,appropriate and  cooperative throughout examination Head:  Normocephalic and atraumatic without obvious abnormality  Mouth:  Oral mucosa and oropharynx without lesions Neck:  No deformities, masses, or tenderness noted. Lungs:  Normal respiratory effort, chest expands symmetrically. Lungs are clear to auscultation, no crackles or wheezes. Heart:  normal rate and irregular rhythm.   Abdomen:  Bowel sounds positive,abdomen soft and non-tender without masses, organomegaly or hernias noted. Msk:  Decreased ROM, joint tenderness, joint swelling, joint warmth, and redness over right wrist Psych:  Cognition and judgment appear intact. Alert and cooperative with normal attention span and concentration. No apparent delusions, illusions, hallucinations   Impression & Recommendations:  Problem # 1:  GOUT (ICD-274.9)  The following medications were removed from the medication list:    Colchicine 0.6 Mg Tabs (Colchicine) ..... One by mouth daily as needed    Celebrex 200 Mg Caps (Celecoxib) ..... One by mouth daily as needed His updated medication list for this problem includes:    Etodolac 400 Mg Tabs (Etodolac) ..... One by mouth bid    Colcrys 0.6 Mg Tabs (Colchicine) .Marland Kitchen... 1 tab by mouth two times a day as needed pain x 3 days as needed  Orders: TLB-Renal Function Panel (80069-RENAL) TLB-Uric Acid, Blood (84550-URIC) Depo- Medrol 40mg  (J1030) Admin of Therapeutic Inj  intramuscular or subcutaneous ([address]) Venipuncture ([address]) Maintain increased hydration and report worsening symptoms, consider xray of wrist if no symptom resolution  Problem # 2:  HYPERTENSION (ICD-401.9)  His updated medication list for this problem includes:    Cardizem Cd 240 Mg Cp24 (Diltiazem hcl coated beads) .Marland Kitchen... 1 once daily    Lasix 40 Mg Tabs (Furosemide) .Marland Kitchen... Take 1 tablet by mouth once a day    Doxazosin Mesylate 4 Mg Tabs (Doxazosin mesylate) .Marland Kitchen... 1 once daily Well controlled on current regimen, no changes at today's  visit  Complete Medication List: 1)  Coumadin 5 Mg Tabs (Warfarin sodium) .... 2.43ml 2 days a week and 5 5 days a week 2)  Digoxin 0.125 Mg Tabs (Digoxin) .... Take 1 tablet by mouth once a day 3)  Cardizem Cd 240 Mg Cp24 (Diltiazem hcl coated beads) .Marland Kitchen.. 1 once daily 4)  Lasix 40 Mg Tabs (Furosemide) .... Take 1 tablet by mouth once a day 5)  Oxycontin 20 Mg Xr12h-tab (Oxycodone hcl) .... One by mouth two times a day prn 6)  Glucophage 500 Mg Tabs (Metformin hcl) .... Two times a day 7)  Novolog 100 Unit/ml Soln (Insulin aspart) .... For sliding scale insulin if our cbg is 300 of greater take 10 units, 200-300 use 5 units and if less than 200 do not use 8)  Lantus Solostar 100 Unit/ml Soln (Insulin glargine) .... 25-30 units at bedtime 9)  Doxazosin Mesylate 4 Mg Tabs (Doxazosin mesylate) .Marland Kitchen.. 1 once daily 10)  Oxycodone Hcl 5 Mg Caps (Oxycodone hcl) .... One by mouth three times a day 11)  Prilosec 20 Mg Cpdr (Omeprazole) .Marland Kitchen.. 1 two times a day 12)  Etodolac 400 Mg Tabs (Etodolac) .... One by mouth bid 13)  Colcrys 0.6 Mg Tabs (Colchicine) .Marland Kitchen.. 1 tab by mouth two times a day as needed pain x 3 days as needed  Other Orders: Protime (42595GL)  Patient Instructions: 1)  May use Etodolac and/or Colchicine two times a day as needed over next 3 days, if symptoms not improving please notify the office so xrays of wrist can be scheduled. Maintain adequate hydration. 2)  Please schedule a follow-up appointment as needed if worse or no improvement Prescriptions: COLCRYS 0.6 MG TABS (COLCHICINE) 1 tab by mouth two times a day as needed pain x 3 days as needed  #10 x 1   Entered and Authorized by:   Danise Edge MD   Signed by:   Danise Edge MD on 05/10/2010   Method used:   Electronically to        [facility]* (retail)       [address]       [place], [place]  [id number]       Ph: [phone]       Fax: [phone]   RxID:   [id number]    Medication  Administration  Injection # 1:    Medication: Depo- Medrol 40mg     Diagnosis: GOUT (ICD-274.9)    Route: IM    Site: R deltoid    Exp Date: 10/2010    Lot #: [id number]    Mfr: Pharm[employer]    Patient tolerated injection without complications    Given by: Josph Macho RMA (May 10, 2010 2:52 PM)  Orders Added: 1)  TLB-Renal Function Panel [80069-RENAL] 2)  TLB-Uric Acid, Blood [84550-URIC] 3)  Depo- Medrol 40mg  [J1030] 4)  Admin of Therapeutic Inj  intramuscular or subcutaneous [96372] 5)  Venipuncture [36415] 6)  Protime [85610QW] 7)  Est. Patient Level III [37628]  Appended Document: Orders Update    Clinical Lists Changes  Observations: Added new observation of ABNORM BLEED: No (05/10/2010 15:35) Added new observation of COUMADIN CHG: No (05/10/2010 15:35) Added new observation of COUM TAB MG: 2.5mg  on sat. & thu 5mg  on other days (05/10/2010 15:35) Added new observation of NEXT PT: 4 weeks (05/10/2010 15:35) Added new observation of COMMENTS2: Wynona Canes, CMA  May 10, 2010 3:36 PM  (05/10/2010 15:35) Added new observation of INR: 2.8  (05/10/2010 15:35)      Laboratory Results   Blood Tests   Date/Time Recieved: May 10, 2010 3:36 PM  Date/Time Reported: May 10, 2010 3:36 PM    INR: 2.8   (Normal Range: 0.88-1.12   Therap INR: 2.0-3.5) Comments: Wynona Canes, CMA  May 10, 2010 3:36 PM       ANTICOAGULATION RECORD PREVIOUS REGIMEN & LAB RESULTS Anticoagulation Diagnosis:  v58.83,v58.61,427.31 on  10/11/2007 Previous INR Goal Range:  2.0-3.0 on  04/15/2008 Previous INR:  1.5 on  03/03/2010 Previous Coumadin Dose(mg):  5 mg qd on  06/16/2008 Previous Regimen:  5mg . QD this week stop for 1 week prior to surgery then the next week 5mg . QD then resume on  03/03/2010  NEW REGIMEN & LAB RESULTS Current INR: 2.8 Current Coumadin Dose(mg): 2.5mg  on sat. & thu 5mg  on other days Regimen: 5mg . QD this week stop for 1 week prior to surgery then the  next week 5mg . QD then resume  (no change)       Repeat testing in: 4 weeks MEDICATIONS COUMADIN 5 MG  TABS (WARFARIN SODIUM) 2.1ml 2 days a week and 5 5 days a week DIGOXIN 0.125 MG  TABS (DIGOXIN) Take  1 tablet by mouth once a day CARDIZEM CD 240 MG  CP24 (DILTIAZEM HCL COATED BEADS) 1 once daily LASIX 40 MG  TABS (FUROSEMIDE) Take 1 tablet by mouth once a day OXYCONTIN 20 MG XR12H-TAB (OXYCODONE HCL) one by mouth two times a day prn GLUCOPHAGE 500 MG  TABS (METFORMIN HCL) two times a day NOVOLOG 100 UNIT/ML  SOLN (INSULIN ASPART) for sliding scale insulin if our CBG is 300 of greater take 10 units, 200-300 use 5 units and if less than 200 do not use LANTUS SOLOSTAR 100 UNIT/ML  SOLN (INSULIN GLARGINE) 25-30 units at bedtime DOXAZOSIN MESYLATE 4 MG  TABS (DOXAZOSIN MESYLATE) 1 once daily OXYCODONE HCL 5 MG CAPS (OXYCODONE HCL) one by mouth three times a day PRILOSEC 20 MG CPDR (OMEPRAZOLE) 1 two times a day ETODOLAC 400 MG TABS (ETODOLAC) one by mouth bid COLCRYS 0.6 MG TABS (COLCHICINE) 1 tab by mouth two times a day as needed pain x 3 days as needed   Anticoagulation Visit Questionnaire      Coumadin dose missed/changed:  No      Abnormal Bleeding Symptoms:  No   Any diet changes including alcohol intake, vegetables or greens since the last visit:  No Any illnesses or hospitalizations since the last visit:  No Any signs of clotting since the last visit (including chest discomfort, dizziness, shortness of breath, arm tingling, slurred speech, swelling or redness in leg):  No   INR therapeutic, notify no concerns   Appended Document: possible gout in ankle/bmw Message left for pt to return call.

## 2010-12-21 NOTE — Progress Notes (Signed)
Summary: Celebrex not helping as much as he thinks it should.  Phone Note Call from Patient   Caller: Patient Call For: Stacie Glaze MD Summary of Call: Pt is stating the Celebrex is not helping very much... He rarely uses his Oxycodone and Oxycontin.  Has used Icy hot at times. [phone] Initial call taken by: Lynann Beaver CMA,  May 07, 2010 2:52 PM  Follow-up for Phone Call        change to lodine 400 two times a day  ( etodolac) Follow-up by: Stacie Glaze MD,  May 07, 2010 3:30 PM    New/Updated Medications: ETODOLAC 400 MG TABS (ETODOLAC) one by mouth bid Prescriptions: ETODOLAC 400 MG TABS (ETODOLAC) one by mouth bid  #60 x 5   Entered by:   Lynann Beaver CMA   Authorized by:   Stacie Glaze MD   Signed by:   Lynann Beaver CMA on 05/07/2010   Method used:   Electronically to        [facility]* (retail)       [address]       [place], [place]  [id number]       Ph: [phone]       Fax: [phone]   RxID:   [id number]  Wife notified.
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
## 2010-12-21 NOTE — Progress Notes (Signed)
Summary: REQ FOR REFILL ON MED  Phone Note Call from Patient   Caller: Patient @ [phone] Reason for Call: Refill Medication Summary of Call: Pt called to req a refill RX for med (OXYCODONE HCL 5 MG CAPS)..... Pt can be reached at [phone] when same is ready for p/u.  Initial call taken by: Debbra Riding,  November 23, 2009 2:38 PM    Prescriptions: OXYCODONE HCL 5 MG CAPS (OXYCODONE HCL) one by mouth three times a day  #90 x 0   Entered by:   Willy Eddy, LPN   Authorized by:   Stacie Glaze MD   Signed by:   Willy Eddy, LPN on 99/24/2683   Method used:   Print then Give to Patient   RxID:   [id number]

## 2010-12-21 NOTE — Procedures (Signed)
Summary: EGD with balloon dilatation/[facility]  EGD with balloon dilatation/[facility]   Imported By: Maryln Gottron 06/10/2010 14:54:13  _____________________________________________________________________  External Attachment:    Type:   Image     Comment:   External Document

## 2010-12-21 NOTE — Progress Notes (Signed)
Summary: bonnie please call  Phone Note Call from Patient Call back at Home Phone [phone]   Caller: Patient Call For: Stacie Glaze MD Summary of Call: Pt would like bonnie to return his call concerning med for arthritis Initial call taken by: Heron Sabins,  May 07, 2010 2:20 PM    Prescriptions: CELEBREX 200 MG CAPS (CELECOXIB) one by mouth daily as needed  #30 x 2   Entered by:   Willy Eddy, LPN   Authorized by:   Stacie Glaze MD   Signed by:   Willy Eddy, LPN on 56/38/7564   Method used:   Electronically to        [facility]* (retail)       [address]       [place], [place]  [id number]       Ph: [phone]       Fax: [phone]   RxID:   [id number]

## 2010-12-21 NOTE — Progress Notes (Signed)
Summary: REQ FOR MED REFILL  Phone Note From Pharmacy   Caller: [facility]* Summary of Call: Rep from [facility] called to adv that this pt was req that a Rx for med: (ROXYCODONE / OXYCODONE HCL 5 MG CAPS) be prepared.... Pt can be reached at [phone] when same is ready for p/u.  Initial call taken by: Debbra Riding,  February 02, 2010 10:04 AM    Prescriptions: OXYCODONE HCL 5 MG CAPS (OXYCODONE HCL) one by mouth three times a day  #90 x 0   Entered by:   Willy Eddy, LPN   Authorized by:   Stacie Glaze MD   Signed by:   Willy Eddy, LPN on 88/41/6606   Method used:   Print then Give to Patient   RxID:   [id number]

## 2010-12-21 NOTE — Assessment & Plan Note (Signed)
Summary: 1 YR F/U   Visit Type:  1 year follow up Referring Provider:  Stacie Glaze, MD Primary Provider:  Stacie Glaze, MD  CC:  No complains.  History of Present Illness: [name] is seen in followup for atrial fibrillation which is permanent and history of syncope for which he had  implantable loop recorderplaced whichwas  removed last year. The patient denies SOB, chest pain, edema or palpitations   Current Medications (verified): 1)  Coumadin 5 Mg  Tabs (Warfarin Sodium) .... 2.31ml 2 Days A Week and 5 5 Days A Week 2)  Digoxin 0.125 Mg  Tabs (Digoxin) .... Take 1 Tablet By Mouth Once A Day 3)  Cardizem Cd 240 Mg  Cp24 (Diltiazem Hcl Coated Beads) .Marland Kitchen.. 1 Once Daily 4)  Lasix 40 Mg  Tabs (Furosemide) .... Take 1 Tablet By Mouth Once A Day 5)  Glucophage 500 Mg  Tabs (Metformin Hcl) .... Two Times A Day 6)  Novolog 100 Unit/ml  Soln (Insulin Aspart) .... For Sliding Scale Insulin If Our Cbg Is 300 of Greater Take 10 Units, 200-300 Use 5 Units and If Less Than 200 Do Not Use 7)  Lantus Solostar 100 Unit/ml  Soln (Insulin Glargine) .... 25-30 Units At Bedtime 8)  Doxazosin Mesylate 4 Mg  Tabs (Doxazosin Mesylate) .Marland Kitchen.. 1 Once Daily 9)  Oxycodone Hcl 5 Mg Caps (Oxycodone Hcl) .... One By Mouth Three Times A Day 10)  Prilosec 20 Mg Cpdr (Omeprazole) .Marland Kitchen.. 1 Two Times A Day 11)  Colcrys 0.6 Mg Tabs (Colchicine) .... As Needed  Allergies: 1)  * Morphine  Past History:  Past Medical History: Last updated: 04/05/2010 FATIGUE / MALAISE (ICD-780.79) OVERWEIGHT/OBESITY (ICD-278.02) SYNCOPE AND COLLAPSE (ICD-780.2) GERD (ICD-530.81) RBBB (ICD-426.4) VENOUS INSUFFICIENCY, CHRONIC (ICD-459.81) PHLEBITIS&THROMBOPHLEB SUP VESSELS LOWER EXTREM (ICD-451.0) CELLULITIS/ABSCESS, LEG (ICD-682.6) BACK PAIN, CHRONIC (ICD-724.5) COUMADIN THERAPY (ICD-V58.61) ENCOUNTER FOR THERAPEUTIC DRUG MONITORING (ICD-V58.83) DIABETES MELLITUS, TYPE II (ICD-250.00) INSUFFICIENCY, VENOUS NOS  (ICD-459.81) HYPERTENSION (ICD-401.9) GOUT (ICD-274.9) CONGESTIVE HEART FAILURE (ICD-428.0) ATRIAL FIBRILLATION (ICD-427.31) Esophageal Stricture colon polyps Diverticulosis  Vital Signs:  Patient profile:   76 year old male Height:      70 inches Weight:      198 pounds BMI:     28.51 Pulse rate:   71 / minute Pulse rhythm:   irregular Resp:     18 per minute BP sitting:   100 / 54  (left arm) Cuff size:   large  Vitals Entered By: Vikki Ports (June 28, 2010 9:49 AM)  Physical Exam  General:  The patient was alert and oriented in no acute distress.Neck veins were flat, carotids were brisk. Lungs were clear. Heart sounds were irregular without murmurs or gallops. Abdomen was soft with active bowel sounds. There is no clubbing cyanosis or edema.    EKG  Procedure date:  06/28/2010  Findings:      atrial fibrillation at 71 Intervals-/0.13/39 RBBB  Impression & Recommendations:  Problem # 1:  SYNCOPE AND COLLAPSE (ICD-780.2) no recurrent syncope His updated medication list for this problem includes:    Coumadin 5 Mg Tabs (Warfarin sodium) .Marland Kitchen... 2.80ml 2 days a week and 5 5 days a week    Cardizem Cd 240 Mg Cp24 (Diltiazem hcl coated beads) .Marland Kitchen... 1 once daily  Problem # 2:  ATRIAL FIBRILLATION (ICD-427.31) atrial flutter ablation is permanent rate control appears adequate by ECG; he is asymptomatic His updated medication list for this problem includes:    Coumadin 5 Mg Tabs (Warfarin  sodium) .Marland Kitchen... 2.60ml 2 days a week and 5 5 days a week    Digoxin 0.125 Mg Tabs (Digoxin) .Marland Kitchen... Take 1 tablet by mouth once a day  Orders: EKG w/ Interpretation (93000)  Patient Instructions: 1)  Your physician recommends that you continue on your current medications as directed. Please refer to the Current Medication list given to you today. 2)  Your physician wants you to follow-up in:   1 YEAR. You will receive a reminder letter in the mail two months in advance. If you don't  receive a letter, please call our office to schedule the follow-up appointment.

## 2010-12-21 NOTE — Progress Notes (Signed)
Summary: Leg Wound  Phone Note Call from Patient Call back at Home Phone [phone]   Caller: Patient Summary of Call: Pt is diabetic and has left left wound that has been there for about a week.  It is still bleeding and appears to be getting worse.  Pt requests to be seen today, please advise. Initial call taken by: Trixie Dredge,  October 18, 2010 12:47 PM  Follow-up for Phone Call        may come in tomorrow at 4:15 Follow-up by: Willy Eddy, LPN,  October 18, 2010 12:51 PM  Additional Follow-up for Phone Call Additional follow up Details #1::        I called pt and sch ov for tomorrow at 4pm, as noted.  Additional Follow-up by: Lucy Antigua,  October 18, 2010 2:25 PM

## 2010-12-21 NOTE — Letter (Signed)
Summary: EGD Instructions  [facility]  [address] [place], [place] [address]   Phone: [phone]  Fax: [phone]       IRL BODIE    1935/02/17    MRN: [id number]       Procedure Day Dorna Bloom:  FRIDAY JULY 15TH, 2011     Arrival Time:  9AM     Procedure Time: 10AM     Location of Procedure:                     [facility] ( Outpatient Registration)    PREPARATION FOR ENDOSCOPY/BALLOON DIL.   ON THE DAY OF THE PROCEDURE:  FRIDAY JULY 15TH, 2011  1.   No solid foods, milk or milk products are allowed after midnight the night before your procedure.  2.   Do not drink anything colored red or purple.  Avoid juices with pulp.  No orange juice.  3.  You may drink clear liquids until 6AM, which is 4 hours before your procedure.                                                                                                CLEAR LIQUIDS INCLUDE: Water Jello Ice Popsicles Tea (sugar ok, no milk/cream) Powdered fruit flavored drinks Coffee (sugar ok, no milk/cream) Gatorade Juice: apple, white grape, white cranberry  Lemonade Clear bullion, consomm, broth Carbonated beverages (any kind) Strained chicken noodle soup Hard Candy   MEDICATION INSTRUCTIONS  Unless otherwise instructed, you should take regular prescription medications with a small sip of water as early as possible the morning of your procedure.  Diabetic patients - see separate instructions.    Stop taking Coumadin on: SUNDAY JULY 10TH, 2011               OTHER INSTRUCTIONS  You will need a responsible adult at least 74 years of age to accompany you and drive you home.   This person must remain in the waiting room during your procedure.  Wear loose fitting clothing that is easily removed.  Leave jewelry and other valuables at home.  However, you may wish to bring a book to read or an iPod/MP3 player to listen to music as you wait for your procedure to  start.  Remove all body piercing jewelry and leave at home.  Total time from sign-in until discharge is approximately 2-3 hours.  You should go home directly after your procedure and rest.  You can resume normal activities the day after your procedure.  The day of your procedure you should not:   Drive   Make legal decisions   Operate machinery   Drink alcohol   Return to work  You will receive specific instructions about eating, activities and medications before you leave.    The above instructions have been reviewed and explained to Mrs.Everetts by phone and mailed to Mr.Aburto. Laureen Ochs LPN  May 19, 2010 2:11 PM      Appended Document: EGD Instructions Letter mailed to patient.

## 2010-12-21 NOTE — Procedures (Signed)
Summary: Upper Endoscopy  Patient: [name] Note: All result statuses are Final unless otherwise noted.  Tests: (1) Upper Endoscopy (EGD)   EGD Upper Endoscopy       DONE     [facility]     [address] [place], [place]  [address]           ENDOSCOPY PROCEDURE REPORT           PATIENT:  [name], [name]  MR#:  [id number]     BIRTHDATE:  07/15/1935, 74 yrs. old  GENDER:  male           ENDOSCOPIST:  Barbette Hair. Arlyce Dice, MD     Referred by:           PROCEDURE DATE:  05/17/2010     PROCEDURE:  EGD with balloon dilatation     ASA CLASS:  Class II     INDICATIONS:  dysphagia           MEDICATIONS:   Fentanyl 50 mcg, Versed 4 mg, glycopyrrolate     (Robinal) 0.2 mg IV     TOPICAL ANESTHETIC:  Cetacaine Spray           DESCRIPTION OF PROCEDURE:   After the risks benefits and     alternatives of the procedure were thoroughly explained, informed     consent was obtained.  The  endoscope was introduced through the     mouth and advanced to the third portion of the duodenum, without     limitations.  The instrument was slowly withdrawn as the mucosa     was fully examined.     <<PROCEDUREIMAGES>>           A stricture was found at the gastroesophageal junction. Moderately     severe stricture. 10mm endoscope passed with moderate resistance     (see image1 and image4). balloon dilation 11-12; moderate     resistance and heme  other findings (see image3). 5mm submucosal     nodule with overlying umbilication in duodenal bulb c/w pancreatic     rest  Otherwise the examination was normal.    Retroflexed views     revealed no abnormalities.    The scope was then withdrawn from     the patient and the procedure completed.           COMPLICATIONS:  None           ENDOSCOPIC IMPRESSION:     1) Stricture at the gastroesophageal junction - s/p balloon     dilitation     2) Pancreatic rest     3) Otherwise normal examination     RECOMMENDATIONS:Resume coumadin in am           REPEAT EXAM:  In 3 weeks  for Dilatation (hold coumadin in advance     of the procedure)           ______________________________     Barbette Hair. Arlyce Dice, MD           CC:  Stacie Glaze, MD           n.     Rosalie Doctor:   Barbette Hair. Ayshia Gramlich at 05/17/2010 12:55 PM           [name], [id number]  Note: An exclamation mark (!) indicates a result that was not dispersed into the flowsheet. Document Creation Date: 05/17/2010 12:56 PM _______________________________________________________________________  (1) Order result status: Final Collection  or observation date-time: 05/17/2010 12:49 Requested date-time:  Receipt date-time:  Reported date-time:  Referring Physician:   Ordering Physician: Melvia Heaps [phone]) Specimen Source:  Source: Launa Grill Order Number: [id number] Lab site:

## 2010-12-21 NOTE — Progress Notes (Signed)
Summary: Repeat Endo/Ballo.Dil. Scheduled  Phone Note Outgoing Call Call back at [facility] Phone [phone]   Call placed by: Laureen Ochs LPN,  May 19, 2010 8:52 AM Call placed to: Patient Summary of Call: Per Endo. report from 05-17-10, pt. needs a repeat Endo/Balloon Dil., off coumadin, in 3 weeks.  Pt's wife asked me to call back later so I can schedule procedure with the patient. Initial call taken by: Laureen Ochs LPN,  May 19, 2010 8:54 AM  Follow-up for Phone Call        Pt. is still unavailable, so pt's wife scheduled procedure at [facility] on 06-04-10 at 10am. All instructions reviewed by phone and mailed to pt. Pt. instructed to call back as needed.  Follow-up by: Laureen Ochs LPN,  May 19, 2010 2:07 PM  New Problems: ESOPHAGEAL STRICTURE (ICD-530.3)   New Problems: ESOPHAGEAL STRICTURE (ICD-530.3)

## 2010-12-21 NOTE — Procedures (Signed)
Summary: Endoscopy Report/[facility]  Endoscopy Report/ [facility]   Imported By: Maryln Gottron 06/28/2010 10:10:25  _____________________________________________________________________  External Attachment:    Type:   Image     Comment:   External Document

## 2010-12-21 NOTE — Progress Notes (Signed)
Summary: gout?  Phone Note Call from Patient   Caller: Patient Call For: Stacie Glaze MD Reason for Call: Acute Illness Summary of Call: Pt is having VERY severe pain in his wrist and thinks it is gout. Is asking for appt or RX  [phone] Initial call taken by: Lynann Beaver CMA,  May 10, 2010 11:36 AM  Follow-up for Phone Call        per dr Lovell Sheehan- may see dr blyth Follow-up by: Willy Eddy, LPN,  May 10, 2010 11:37 AM  Additional Follow-up for Phone Call Additional follow up Details #1::        appt with dr blyth Additional Follow-up by: Willy Eddy, LPN,  May 10, 2010 11:51 AM

## 2010-12-21 NOTE — Letter (Signed)
Summary: EGD Instructions  [facility]  [address] [place], [place] [address]   Phone: [phone]  Fax: [phone]       [name]    22-Apr-1935    MRN: [id number]       Procedure Day /Date:MONDAY 05/17/2010     Arrival Time:11:30AM     Procedure Time:12:30PM     Location of Procedure:                     [facility] ( Outpatient Registration)    PREPARATION FOR ENDOSCOPY/BALLOON   On 05/17/2010  THE DAY OF THE PROCEDURE:  1.   No solid foods, milk or milk products are allowed after midnight the night before your procedure.  2.   Do not drink anything colored red or purple.  Avoid juices with pulp.  No orange juice.  3.  You may drink clear liquids until8:30AM, which is 4 hours before your procedure.                                                                                                CLEAR LIQUIDS INCLUDE: Water Jello Ice Popsicles Tea (sugar ok, no milk/cream) Powdered fruit flavored drinks Coffee (sugar ok, no milk/cream) Gatorade Juice: apple, white grape, white cranberry  Lemonade Clear bullion, consomm, broth Carbonated beverages (any kind) Strained chicken noodle soup Hard Candy   MEDICATION INSTRUCTIONS  Unless otherwise instructed, you should take regular prescription medications with a small sip of water as early as possible the morning of your procedure.  Diabetic patients - see separate instructions.    Stop taking Coumadin on  _  (5 days before procedure).05/12/2010 PER DR Arlyce Dice               OTHER INSTRUCTIONS  You will need a responsible adult at least 74 years of age to accompany you and drive you home.   This person must remain in the waiting room during your procedure.  Wear loose fitting clothing that is easily removed.  Leave jewelry and other valuables at home.  However, you may wish to bring a book to read or an iPod/MP3 player to listen to music as you wait for your procedure to  start.  Remove all body piercing jewelry and leave at home.  Total time from sign-in until discharge is approximately 2-3 hours.  You should go home directly after your procedure and rest.  You can resume normal activities the day after your procedure.  The day of your procedure you should not:   Drive   Make legal decisions   Operate machinery   Drink alcohol   Return to work  You will receive specific instructions about eating, activities and medications before you leave.    The above instructions have been reviewed and explained to me by   _______________________    I fully understand and can verbalize these instructions _____________________________ Date _________

## 2010-12-21 NOTE — Procedures (Signed)
Summary: Instruction for procedure/[facility]  Instruction for procedure/[facility] [place]   Imported By: Sherian Rein 04/13/2010 14:42:31  _____________________________________________________________________  External Attachment:    Type:   Image     Comment:   External Document
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
## 2010-12-21 NOTE — Miscellaneous (Signed)
Summary: Controlled Substances Contract  Controlled Substances Contract   Imported By: Maryln Gottron 05/27/2010 14:34:59  _____________________________________________________________________  External Attachment:    Type:   Image     Comment:   External Document

## 2010-12-21 NOTE — Letter (Signed)
Summary: EGD Instructions  [facility]  [address] [place], [place] [address]   Phone: [phone]  Fax: [phone]       [name]    04/20/1935    MRN: [id number]       Procedure Day /Date:  FRIDAY AUGUST 5TH, 2011     Arrival Time:  11:30AM     Procedure Time:  12:30PM     Location of Procedure:                     [facility] ( Outpatient Registration)   PREPARATION FOR ENDOSCOPY   ON THE DAY OF THE PROCEDURE: FRIDAY AUGUST 5TH, 2011  1.   No solid foods, milk or milk products are allowed after midnight the night before your procedure.  2.   Do not drink anything colored red or purple.  Avoid juices with pulp.  No orange juice.  3.  You may drink clear liquids until 8:30AM, which is 4 hours before your procedure.                                                                                                CLEAR LIQUIDS INCLUDE: Water Jello Ice Popsicles Tea (sugar ok, no milk/cream) Powdered fruit flavored drinks Coffee (sugar ok, no milk/cream) Gatorade Juice: apple, white grape, white cranberry  Lemonade Clear bullion, consomm, broth Carbonated beverages (any kind) Strained chicken noodle soup Hard Candy   MEDICATION INSTRUCTIONS  Unless otherwise instructed, you should take regular prescription medications with a small sip of water as early as possible the morning of your procedure.  Diabetic patients - see separate instructions.  ****Stop taking Coumadin on: SUNDAY JULY 31ST, 2011  (5 days before procedure).          OTHER INSTRUCTIONS  You will need a responsible adult at least 74 years of age to accompany you and drive you home.   This person must remain in the waiting room during your procedure.  Wear loose fitting clothing that is easily removed.  Leave jewelry and other valuables at home.  However, you may wish to bring a book to read or an iPod/MP3 player to listen to music as you wait for your procedure to  start.  Remove all body piercing jewelry and leave at home.  Total time from sign-in until discharge is approximately 2-3 hours.  You should go home directly after your procedure and rest.  You can resume normal activities the day after your procedure.  The day of your procedure you should not:   Drive   Make legal decisions   Operate machinery   Drink alcohol   Return to work  You will receive specific instructions about eating, activities and medications before you leave.    The above instructions have been reviewed and explained to [name] by phone and mailed to patient. Laureen Ochs LPN  June 04, 2010 11:31 AM      Appended Document: EGD Instructions Letter mailed to patient.

## 2010-12-21 NOTE — Progress Notes (Signed)
Summary: coumadin stop call on hold 5+min 2-1  Phone Note From Other Clinic   Caller: kyle,[facility] ws on behalf of Dr. Baldwin [place] Call For: [name] Summary of Call: Need to get preop clearance so he can stop his coumadin before the surgery.  Call back to [phone].   Initial call taken by: Rudy Jew, RN,  December 22, 2009 11:54 AM  Follow-up for Phone Call        per d r Aalliyah Kilker- may hold coumadin 5 days prior to surgery Follow-up by: Willy Eddy, LPN,  December 22, 2009 12:49 PM  Additional Follow-up for Phone Call Additional follow up Details #1::        Phone call completed Left message to inform.  Could never get a live person on 2 return calls. Additional Follow-up by: Rudy Jew, RN,  December 22, 2009 3:58 PM
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
## 2010-12-21 NOTE — Progress Notes (Signed)
Summary: refill  Phone Note Refill Request Call back at Home Phone [phone] Message from:  Patient----live call  Refills Requested: Medication #1:  OXYCODONE HCL 5 MG CAPS one by mouth three times a day Initial call taken by: Warnell Forester,  Apr 09, 2010 12:45 PM    Prescriptions: OXYCODONE HCL 5 MG CAPS (OXYCODONE HCL) one by mouth three times a day  #90 x 0   Entered by:   Willy Eddy, LPN   Authorized by:   Stacie Glaze MD   Signed by:   Willy Eddy, LPN on 09/81/1914   Method used:   Print then Give to Patient   RxID:   [id number]

## 2010-12-21 NOTE — Procedures (Signed)
Summary: Upper Endoscopy  Patient: [name] Note: All result statuses are Final unless otherwise noted.  Tests: (1) Upper Endoscopy (EGD)   EGD Upper Endoscopy       DONE     [facility]     [address] [place], [place]  [address]           ENDOSCOPY PROCEDURE REPORT           PATIENT:  [name], [name]  MR#:  [id number]     BIRTHDATE:  [birth year], 75 yrs. old  GENDER:  male           ENDOSCOPIST:  Barbette Hair. Arlyce Dice, MD     Referred by:           PROCEDURE DATE:  06/25/2010     PROCEDURE:  EGD with balloon dilatation     ASA CLASS:  Class II     INDICATIONS:  dilation of esophageal stricture           MEDICATIONS:   Fentanyl 75 mcg IV, Versed 5 mg IV, glycopyrrolate     (Robinal) 0.2 mg IV     TOPICAL ANESTHETIC:  Cetacaine Spray           DESCRIPTION OF PROCEDURE:   After the risks benefits and     alternatives of the procedure were thoroughly explained, informed     consent was obtained.  The EG-[id number] ([id number]) endoscope was     introduced through the mouth and advanced to the third portion of     the duodenum, without limitations.  The instrument was slowly     withdrawn as the mucosa was fully examined.     <<PROCEDUREIMAGES>>           A stricture was found in the distal esophagus. Previously     described stricture about 2cm proximal to GE junction balloon     dilation 15-16 (see image001 and image002).5-19mm; moderate     resistance; minimal heme  Otherwise the examination was normal.     Retroflexed views revealed no abnormalities.    The scope was then     withdrawn from the patient and the procedure completed.           COMPLICATIONS:  None           ENDOSCOPIC IMPRESSION:     1) Stricture in the distal esophagus - s/p balloon dilitation     2) Otherwise normal examination     RECOMMENDATIONS:     1) dilatations PRN     2) Call office next 2-3 days to schedule an office appointment     for 6 months     3) resume coumadin in am           REPEAT EXAM:  No           ______________________________     Barbette Hair. Arlyce Dice, MD           CC:  Stacie Glaze, MD           n.     Rosalie Doctor:   Barbette Hair. Miro Balderson at 06/25/2010 12:58 PM           [name], [phone]  Note: An exclamation mark (!) indicates a result that was not dispersed into the flowsheet. Document Creation Date: 06/25/2010 12:59 PM _______________________________________________________________________  (1) Order result status: Final Collection or observation date-time: 06/25/2010 12:55 Requested date-time:  Receipt date-time:  Reported date-time:  Referring Physician:   Ordering Physician: Melvia Heaps [phone]) Specimen Source:  Source: Launa Grill Order Number: [id number] Lab site:

## 2010-12-21 NOTE — Progress Notes (Signed)
Summary: Forgot to stop Coumadin  Phone Note Call from Patient Call back at Home Phone [phone]   Call For: Dr Arlyce Dice Reason for Call: Talk to Nurse Summary of Call: Forgot to stop his coumadin for his Endo 06-04-10 Should he reschedule? Initial call taken by: Leanor Kail [facility],  June 01, 2010 11:22 AM  Follow-up for Phone Call        DR.KAPLAN--If he stops it as of today can he still have Endo. on Friday? Follow-up by: Laureen Ochs LPN,  June 01, 2010 11:36 AM  Additional Follow-up for Phone Call Additional follow up Details #1::        ok for Friday check STAT INR prior to procedure on Friday Additional Follow-up by: Louis Meckel MD,  June 01, 2010 1:18 PM    Additional Follow-up for Phone Call Additional follow up Details #2::    Above MD orders reviewed with patient. Noreene Larsson at [facility] End[facility]. Dept. has the order for lab. Pt. instructed to call back as needed.  Follow-up by: Laureen Ochs LPN,  June 01, 2010 2:03 PM

## 2010-12-21 NOTE — Assessment & Plan Note (Signed)
Summary: gerd, trouble swallowing.em   History of Present Illness Visit Type: consult  Primary GI MD: Melvia Heaps MD [facility] Primary Provider: Stacie Glaze, MD Requesting Provider: Stacie Glaze, MD Chief Complaint: GERD and dysphagia with solids and liquids  History of Present Illness:   [name] is a pleasant 76 year old white male referred at the request of Dr. Lovell Sheehan for evaluation of dysphagia.  History of an esophageal stricture which was dilated in 2003.  IAn adenomas polyp was removed in 2007.  He omplained of intermittent dysphagia solids.  Since switching from Zantac to omeprazole symptoms have improved although they remain is without pyrosis.  Patient takes Coumadin for fibrillation.  He has insulin-dependent diabetes mellitus.   GI Review of Systems    Reports acid reflux, dysphagia with liquids, dysphagia with solids, heartburn, and  loss of appetite.      Denies abdominal pain, belching, bloating, chest pain, nausea, vomiting, vomiting blood, weight loss, and  weight gain.        Denies anal fissure, black tarry stools, change in bowel habit, constipation, diarrhea, diverticulosis, fecal incontinence, heme positive stool, hemorrhoids, irritable bowel syndrome, jaundice, light color stool, liver problems, rectal bleeding, and  rectal pain.    Current Medications (verified): 1)  Coumadin 5 Mg  Tabs (Warfarin Sodium) .... 2.36ml 2 Days A Week and 5 5 Days A Week 2)  Digoxin 0.125 Mg  Tabs (Digoxin) .... Take 1 Tablet By Mouth Once A Day 3)  Cardizem Cd 240 Mg  Cp24 (Diltiazem Hcl Coated Beads) .Marland Kitchen.. 1 Once Daily 4)  Lasix 40 Mg  Tabs (Furosemide) .... Take 1 Tablet By Mouth Once A Day 5)  Oxycontin 20 Mg Xr12h-Tab (Oxycodone Hcl) .... One By Mouth Two Times A Day Prn 6)  Glucophage 500 Mg  Tabs (Metformin Hcl) .... Two Times A Day 7)  Novolog 100 Unit/ml  Soln (Insulin Aspart) .... For Sliding Scale Insulin If Our Cbg Is 300 of Greater Take 10 Units, 200-300 Use 5 Units  and If Less Than 200 Do Not Use 8)  Lantus Solostar 100 Unit/ml  Soln (Insulin Glargine) .... 25-30 Units At Bedtime 9)  Doxazosin Mesylate 4 Mg  Tabs (Doxazosin Mesylate) .Marland Kitchen.. 1 Once Daily 10)  Colchicine 0.6 Mg Tabs (Colchicine) .... One By Mouth Daily As Needed 11)  Celebrex 200 Mg Caps (Celecoxib) .... One By Mouth Daily As Needed 12)  Oxycodone Hcl 5 Mg Caps (Oxycodone Hcl) .... One By Mouth Three Times A Day 13)  Prilosec 20 Mg Cpdr (Omeprazole) .Marland Kitchen.. 1 Two Times A Day  Allergies (verified): 1)  * Morphine  Past History:  Past Medical History: Reviewed history from 04/05/2010 and no changes required. FATIGUE / MALAISE (ICD-780.79) OVERWEIGHT/OBESITY (ICD-278.02) SYNCOPE AND COLLAPSE (ICD-780.2) GERD (ICD-530.81) RBBB (ICD-426.4) VENOUS INSUFFICIENCY, CHRONIC (ICD-459.81) PHLEBITIS&THROMBOPHLEB SUP VESSELS LOWER EXTREM (ICD-451.0) CELLULITIS/ABSCESS, LEG (ICD-682.6) BACK PAIN, CHRONIC (ICD-724.5) COUMADIN THERAPY (ICD-V58.61) ENCOUNTER FOR THERAPEUTIC DRUG MONITORING (ICD-V58.83) DIABETES MELLITUS, TYPE II (ICD-250.00) INSUFFICIENCY, VENOUS NOS (ICD-459.81) HYPERTENSION (ICD-401.9) GOUT (ICD-274.9) CONGESTIVE HEART FAILURE (ICD-428.0) ATRIAL FIBRILLATION (ICD-427.31) Esophageal Stricture colon polyps Diverticulosis  Past Surgical History: Lumbar laminectomy Lumbar fusion Rotator cuff repair ruptured rt rectus muscle bilateral eye surgery   Family History: father died 10 MI mother died of pnemonia 30 No FH of Colon Cancer:  Social History: Retired Married Former Smoker Alcohol Use - yes occasional Daily Caffeine Use: 2 daily  Illicit Drug Use - no Drug Use:  no  Review of Systems  The patient complains of arthritis/joint pain and swelling of feet/legs.  The patient denies allergy/sinus, anemia, anxiety-new, back pain, blood in urine, breast changes/lumps, change in vision, confusion, cough, coughing up blood, depression-new, fainting, fatigue, fever,  headaches-new, hearing problems, heart murmur, heart rhythm changes, itching, muscle pains/cramps, night sweats, nosebleeds, shortness of breath, skin rash, sleeping problems, sore throat, swollen lymph glands, thirst - excessive, urination - excessive, urination changes/pain, urine leakage, vision changes, and voice change.         All other systems were reviewed and were negative   Vital Signs:  Patient profile:   76 year old male Height:      70 inches Weight:      194 pounds BMI:     27.94 BSA:     2.06 Pulse rate:   76 / minute Pulse rhythm:   regular BP sitting:   132 / 64  (left arm) Cuff size:   regular  Vitals Entered By: Ok Anis CMA (Apr 08, 2010 9:03 AM)  Physical Exam  Additional Exam:  on physical exam he is a well-developed alert male  skin: anicteric HEENT: normocephalic; PEERLA; no nasal or pharyngeal abnormalities neck: supple nodes: no cervical lymphadenopathy chest: clear to ausculatation and percussion heart: no murmurs, gallops, or rubs abd: soft, nontender; BS normoactive; no abdominal masses, tenderness, organomegaly rectal: deferred ext: no cynanosis, clubbing, edema skeletal: no deformities neuro: oriented x 3; no focal abnormalities    Impression & Recommendations:  Problem # 1:  DYSPHAGIA UNSPECIFIED (ICD-787.20)  This is very likely due to recurrent esophageal stricture.  Recommendations #1 upper endoscopy with dilatation as indicated.  Coumadin will be held and advance of the procedure.  Risks, alternatives, and complications of the procedure, including bleeding, perforation, and possible need for surgery, were explained to the patient.  Patient's questions were answered.  Orders: ZEGD Balloon Dil (ZEGD Balloon)  Problem # 2:  GERD (ICD-530.81)  Plan to continue omeprazole  Orders: ZEGD Balloon Dil (ZEGD Balloon)  Problem # 3:  DIABETES MELLITUS, TYPE II (ICD-250.00) Assessment: Comment Only  Problem # 4:  COUMADIN THERAPY  (ICD-V58.61) Assessment: Comment Only  Patient Instructions: 1)  Copy sent to : Peri Jefferson 2)  Your EGD is scheduled for 05/17/2010 at 12:30pm at [facility] 3)  You have been instructed on your diabetic meds and also your coumadin 4)  The medication list was reviewed and reconciled.  All changed / newly prescribed medications were explained.  A complete medication list was provided to the patient / caregiver.

## 2010-12-21 NOTE — Progress Notes (Signed)
Summary: REFILL REQUEST (Oxycodone)  Phone Note Refill Request Message from:  Patient on Apr 08, 2010 10:44 AM  Refills Requested: Medication #1:  OXYCODONE HCL 5 MG CAPS one by mouth three times a day   Notes: #90.... Pt can be reached at [phone] when Rx is ready for p/u.    Initial call taken by: Debbra Riding,  Apr 08, 2010 10:44 AM    Prescriptions: OXYCONTIN 20 MG XR12H-TAB (OXYCODONE HCL) one by mouth two times a day prn  #60 x 0   Entered by:   Willy Eddy, LPN   Authorized by:   Stacie Glaze MD   Signed by:   Willy Eddy, LPN on 09/81/1914   Method used:   Print then Give to Patient   RxID:   [id number]   Appended Document: REFILL REQUEST (Oxycodone) pt informed -ready for pick up

## 2010-12-21 NOTE — Progress Notes (Signed)
Summary: RETURN CALL  Phone Note Call from Patient Call back at Home Phone [phone]   Caller: Patient Call For: Stacie Glaze MD Summary of Call: PT IS RETURN CHRISTY CALL Initial call taken by: Heron Sabins,  May 11, 2010 12:35 PM  Follow-up for Phone Call        Pt called to speak wtih Neysa Bonito, CMA (currently with a patient).... Pt adv that he would attempt to call back later today.  Follow-up by: Debbra Riding,  May 11, 2010 2:23 PM  Additional Follow-up for Phone Call Additional follow up Details #1::        Pt is aware of lab work and said he is feeling much better. Additional Follow-up by: Kathrynn Speed CMA,  May 11, 2010 3:39 PM

## 2010-12-21 NOTE — Progress Notes (Signed)
Summary: REQ FOR RETURN CALL  Phone Note Call from Patient   Caller: Patient Reason for Call: Acute Illness Summary of Call: Pt called in to speak with Dr Lovell Sheehan  or  Rushie Goltz, LPN.... Pt adv that he needs to speak with someone in ref to medication concerns.... Pt adv it didn't have to do with refills but would not elaborate as to the reason for the call just that it was concerning medication.... Pt can be reached at [phone].  Initial call taken by: Debbra Riding,  August 13, 2010 2:00 PM  Follow-up for Phone Call        Due to financial reasons, pt needs to go back on Hydrododone, please. Follow-up by: Lynann Beaver CMA,  August 13, 2010 2:20 PM  Additional Follow-up for Phone Call Additional follow up Details #1::        Wants to speak to [facility]. Additional Follow-up by: Warnell Forester,  August 16, 2010 4:09 PM    Additional Follow-up for Phone Call Additional follow up Details #2::    Pt called back again today and said that he wants a call back from [facility] today. Pt said that if call not rcvd, pt is calling the Engineer, manufacturing.   Follow-up by: Lucy Antigua,  August 17, 2010 1:35 PM  Additional Follow-up for Phone Call Additional follow up Details #3:: Details for Additional Follow-up Action Taken: per dr Lovell Sheehan -have have vicodan 5-500= #90 1 three times a day as needed with 3 refills pt informed Additional Follow-up by: Willy Eddy, LPN,  August 17, 2010 2:22 PM

## 2010-12-21 NOTE — Letter (Signed)
Summary: New Patient letter  [facility]  [address] [place], [place] [address]   Phone: [phone]  Fax: [phone]       03/10/2010 MRN: [id number]  [name] [address] [place], [place]  [address]  Dear [name],  Welcome to the Gastroenterology Division at [facility].    You are scheduled to see Dr.  Melvia Heaps on Apr 08, 2010 at 9:00am on the 3rd floor at [facility], [address]  We ask that you try to arrive at our office 15 minutes prior to your appointment time to allow for check-in.  We would like you to complete the enclosed self-administered evaluation form prior to your visit and bring it with you on the day of your appointment.  We will review it with you.  Also, please bring a complete list of all your medications or, if you prefer, bring the medication bottles and we will list them.  Please bring your insurance card so that we may make a copy of it.  If your insurance requires a referral to see a specialist, please bring your referral form from your primary care physician.  Co-payments are due at the time of your visit and may be paid by cash, check or credit card.     Your office visit will consist of a consult with your physician (includes a physical exam), any laboratory testing he/she may order, scheduling of any necessary diagnostic testing (e.g. x-ray, ultrasound, CT-scan), and scheduling of a procedure (e.g. Endoscopy, Colonoscopy) if required.  Please allow enough time on your schedule to allow for any/all of these possibilities.    If you cannot keep your appointment, please call [phone] to cancel or reschedule prior to your appointment date.  This allows [place] the opportunity to schedule an appointment for another patient in need of care.  If you do not cancel or reschedule by 5 p.m. the business day prior to your appointment date, you will be charged a $50.00 late cancellation/no-show fee.    Thank you  for choosing [facility] for your medical needs.  We appreciate the opportunity to care for you.  Please visit [place] at our website  to learn more about our practice.                     Sincerely,                                                             The Gastroenterology Division

## 2010-12-21 NOTE — Assessment & Plan Note (Signed)
Summary: pt///ccm flu shot /njr  Nurse Visit   Allergies: 1)  * Morphine Laboratory Results   Blood Tests   Date/Time Received: August 12, 2010 4:04 PM  Date/Time Reported: August 12, 2010 4:04 PM    INR: 1.5   (Normal Range: 0.88-1.12   Therap INR: 2.0-3.5) Comments: Wynona Canes, CMA  August 12, 2010 4:05 PM     Orders Added: 1)  Est. Patient Level I [99211] 2)  Protime [[address]UE] 3)  Flu Vaccine 62yrs + MEDICARE PATIENTS [Q2039] 4)  Administration Flu vaccine - MCR [G0008] 5)  Administration Flu vaccine - MCR [G0008]  Laboratory Results   Blood Tests      INR: 1.5   (Normal Range: 0.88-1.12   Therap INR: 2.0-3.5) Comments: Wynona Canes, CMA  August 12, 2010 4:05 PM       ANTICOAGULATION RECORD PREVIOUS REGIMEN & LAB RESULTS Anticoagulation Diagnosis:  v58.83,v58.61,427.31 on  10/11/2007 Previous INR Goal Range:  2.0-3.0 on  04/15/2008 Previous INR:  1.4 on  07/09/2010 Previous Coumadin Dose(mg):  2.5mg  on sat. & thu 5mg  on other days on  05/10/2010 Previous Regimen:  same dose on  07/09/2010 Previous Coagulation Comments:  Off for 5 days twice for a procedure on  07/09/2010  NEW REGIMEN & LAB RESULTS Current INR: 1.5 Regimen: 5mg  qd       Repeat testing in: 2 weeks MEDICATIONS COUMADIN 5 MG  TABS (WARFARIN SODIUM) 2.65ml 2 days a week and 5 5 days a week DIGOXIN 0.125 MG  TABS (DIGOXIN) Take 1 tablet by mouth once a day CARDIZEM CD 240 MG  CP24 (DILTIAZEM HCL COATED BEADS) 1 once daily LASIX 40 MG  TABS (FUROSEMIDE) Take 1 tablet by mouth once a day GLUCOPHAGE 500 MG  TABS (METFORMIN HCL) two times a day NOVOLOG 100 UNIT/ML  SOLN (INSULIN ASPART) for sliding scale insulin if our CBG is 300 of greater take 10 units, 200-300 use 5 units and if less than 200 do not use LANTUS SOLOSTAR 100 UNIT/ML  SOLN (INSULIN GLARGINE) 25-30 units at bedtime DOXAZOSIN MESYLATE 4 MG  TABS (DOXAZOSIN MESYLATE) 1 once daily OXYCONTIN 20 MG XR12H-TAB  (OXYCODONE HCL) one by mouth two times a day PRILOSEC 20 MG CPDR (OMEPRAZOLE) 1 two times a day COLCRYS 0.6 MG TABS (COLCHICINE) as needed ETODOLAC 300 MG CAPS (ETODOLAC) one by mouth two times a day   Anticoagulation Visit Questionnaire      Coumadin dose missed/changed:  Yes      Coumadin Dose Comments:  one or more missed dose(s)      Abnormal Bleeding Sympto     Flu Vaccine Consent Questions     Do you have a history of severe allergic reactions to this vaccine? no    Any prior history of allergic reactions to egg and/or gelatin? no    Do you have a sensitivity to the preservative Thimersol? no    Do you have a past history of Guillan-Barre Syndrome? no    Do you currently have an acute febrile illness? no    Have you ever had a severe reaction to latex? no    Vaccine information given and explained to patient? yes    Are you currently pregnant? no    Lot Number:[id number]   Exp Date:05/21/2011   Site Given  Left Deltoid IM Any diet changes including alcohol intake, vegetables or greens since the last visit:  No Any illnesses or hospitalizations since the last visit:  No Any signs of  clotting since the last visit (including chest discomfort, dizziness, shortness oFlu Vaccine Consent Questions

## 2010-12-21 NOTE — Progress Notes (Signed)
Summary: ? about medicine  Phone Note Call from Patient Call back at Home Phone [phone]   Caller: Patient---live call Summary of Call: pt having acid reflux. Current medicine is not helping enough. He would like a call back from Dr Lovell Sheehan or Rushie Goltz. Initial call taken by: Warnell Forester,  March 10, 2010 1:42 PM  Follow-up for Phone Call        had esophagus stretched yrs ago- having problem again- difficulty swallowing and not eating well. Has been on Zantac for long time and doesn't seen to be helping now. Follow-up by: Raechel Ache, RN,  March 10, 2010 2:48 PM  Additional Follow-up for Phone Call Additional follow up Details #1::        prilosec    20 1 two times a day and referral to gi Additional Follow-up by: Willy Eddy, LPN,  March 10, 2010 2:55 PM    Additional Follow-up for Phone Call Additional follow up Details #2::    called patient. ref to Terri. Follow-up by: Raechel Ache, RN,  March 10, 2010 2:58 PM  New/Updated Medications: PRILOSEC 20 MG CPDR (OMEPRAZOLE) 1 two times a day Prescriptions: PRILOSEC 20 MG CPDR (OMEPRAZOLE) 1 two times a day  #60 x 3   Entered by:   Raechel Ache, RN   Authorized by:   Stacie Glaze MD   Signed by:   Raechel Ache, RN on 03/10/2010   Method used:   Electronically to        [facility]* (retail)       [address]       [place], [place]  [id number]       Ph: [phone]       Fax: [phone]   RxID:   [id number]

## 2010-12-21 NOTE — Procedures (Signed)
Summary: Colonoscopy Report   Colonoscopy  Procedure date:  06/01/2006  Findings:      Results: Polyp.  Results: Diverticulosis.       Location:  [facility].    Procedures Next Due Date:    Colonoscopy: 05/2011  Patient Name: [name], [name] MRN: [id number] Procedure Procedures: Colonoscopy CPT: 14782.    with Hot Biopsy(s)CPT: [id number].  Personnel: Endoscopist: Barbette Hair. Arlyce Dice, MD.  Patient Consent: Procedure, Alternatives, Risks and Benefits discussed, consent obtained, from patient.  Indications  Average Risk Screening Routine.  History  Current Medications: Patient is not currently taking Coumadin.  Pre-Exam Physical: Performed Jun 01, 2006. Cardio-pulmonary exam, HEENT exam , Abdominal exam, Mental status exam WNL.  Comments: Patient history reviewed/updated, physical performed prior to initiation of sedation? Exam Exam: Extent of exam reached: Cecum, extent intended: Cecum.  The cecum was identified by IC valve. The Cecum was reached at 11:49 AM. ended at 11:56 AM. Time for Withdrawl: 00:07. Colon retroflexion performed. ASA Classification: II. Tolerance: good.  Monitoring: Pulse and BP monitoring, Oximetry used. Supplemental O2 given. at 2 Liters.  Colon Prep Used Miralax for colon prep. Prep results: fair, adequate exam.  Sedation Meds: Patient assessed and found to be appropriate for moderate (conscious) sedation. Sedation was managed by the Endoscopist. Fentanyl 75 mcg. given IV. Versed 6 mg. given IV.  Findings POLYP: Ascending Colon, Maximum size: 3 mm. Procedure:  hot biopsy, Polyp sent to pathology. ICD9: Colon Polyps: 211.3.  POLYP: Cecum, Maximum size: 3 mm. Procedure:  hot biopsy, sent to pathology. ICD9: Colon Polyps: 211.3.  NORMAL EXAM: Cecum.  - DIVERTICULOSIS: Sigmoid Colon. ICD9: Diverticulosis: 562.10. Comments: Scattered diverticula.  NORMAL EXAM: Rectum.   Assessment Abnormal examination, see findings above.    Diagnoses: 211.3: Colon Polyps.  562.10: Diverticulosis.   Events  Unplanned Interventions: No intervention was required.  Unplanned Events: There were no complications. Plans  Post Exam Instructions: Post sedation instructions given.  Patient Education: Patient given standard instructions for: Polyps. Diverticulosis.  Disposition: After procedure patient sent to recovery. After recovery patient sent home.  Scheduling/Referral: Colonoscopy, to Barbette Hair. Arlyce Dice, MD, around Jun 02, 2011.    cc: Stacie Glaze, MD  This report was created from the original endoscopy report, which was reviewed and signed by the above listed endoscopist.

## 2010-12-21 NOTE — Letter (Signed)
Summary: Diabetic Instructions  [facility]  [address] [place], [place] [address]   Phone: [phone]  Fax: [phone]    [name] [birth year] MRN: [id number]   X    ORAL DIABETIC MEDICATION INSTRUCTIONS  The day before your procedure:   Take your diabetic pill as you do normally  The day of your procedure:   Do not take your diabetic pill    We will check your blood sugar levels during the admission process and again in Recovery before discharging you home  ________________________________________________________________________  X    INSULIN (LONG ACTING) MEDICATION INSTRUCTIONS (Lantus, NPH, 70/30, Humulin, Novolin-N)   The day before your procedure:   Take  your regular evening dose    The day of your procedure:   Do not take your morning dose    X    INSULIN (SHORT ACTING) MEDICATION INSTRUCTIONS (Regular, Humulog, Novolog)   The day before your procedure:   Do not take your evening dose   The day of your procedure:   Do not take your morning dose   _  _   INSULIN PUMP MEDICATION INSTRUCTIONS  We will contact the physician managing your diabetic care for written dosage instructions for the day before your procedure and the day of your procedure.  Once we have received the instructions, we will contact you.  Appended Document: Diabetic Instructions Copy of these instructions mailed to pt. in prep. for his repeat procedure 06-04-10.

## 2010-12-21 NOTE — Procedures (Signed)
Summary: EGd Report   EGD  Procedure date:  07/16/2002  Findings:      Findings: Esophagitis  Findings: Stricture:  Location: [facility]    Patient Name: [name], [name] MRN: [id number] Procedure Procedures: Panendoscopy (EGD) CPT: 43235.    with biopsy(s)/brushing(s). CPT: D1846139.    with esophageal dilation. CPT: G9296129.  Personnel: Endoscopist: Barbette Hair. Arlyce Dice, MD.  Indications  Abnormal Exams, Studies: UGI, abnormal.  Symptoms: Dysphagia.  History  Pre-Exam Physical: Performed Jul 16, 2002  Entire physical exam was normal.  Exam Exam Info: Maximum depth of insertion Duodenum, intended Duodenum. Vocal cords visualized. Gastric retroflexion performed. ASA Classification: II. Tolerance: good.  Sedation Meds: Robinul 0.2 given IV. Fentanyl 60 given IV. Versed 7 mg. given IV. Cetacaine Spray 2 sprays given aerosolized.  Monitoring: BP and pulse monitoring done. Oximetry used. Supplemental O2 given at 2 Liters.  Findings - ESOPHAGEAL INFLAMMATION: suspected as a result of infectious esophagitis. Brushing/Esoph Inflamtn taken. ICD9: Esophagitis: 530. 10. Comments: Multiple white plaques.  STRICTURE / STENOSIS: Stenosis in Distal Esophagus.  Constriction: almost complete. 37 cm from mouth. ICD9: Esophageal Stricture: 530.3.  - Dilation: Distal Esophagus. Procedure was performed under Fluoroscopy. Savary dilator used, Diameter: 16-17-18 mm, Moderate Resistance, Minimal Heme present on extraction. Outcome: successful.    Comments: r/o achalasia Assessment Abnormal examination, see findings above.  Diagnoses: 530.10: Esophagitis.  530.3: Esophageal Stricture.   Events  Unplanned Intervention: No unplanned interventions were required.  Unplanned Events: There were no complications. Plans Medication(s): Await pathology. Continue current medications.  Scheduling: Office Visit, to [employer]. Arlyce Dice, MD, around Jul 23, 2002.    cc:  Darryll Capers,  MD  This report was created from the original endoscopy report, which was reviewed and signed by the above listed endoscopist.

## 2010-12-21 NOTE — Progress Notes (Signed)
Summary: refills  Phone Note Refill Request Call back at Home Phone [phone] Message from:  Patient--live call  Refills Requested: Medication #1:  OXYCODONE HCL 5 MG CAPS one by mouth three times a day also need also newrx for Zolpidem tartrate 10 mg. please call pt when both rxs are ready for pickup.  Initial call taken by: Warnell Forester,  June 23, 2010 12:56 PM Caller: Patient    Prescriptions: OXYCODONE HCL 5 MG CAPS (OXYCODONE HCL) one by mouth three times a day  #90 x 0   Entered by:   Willy Eddy, LPN   Authorized by:   Stacie Glaze MD   Signed by:   Willy Eddy, LPN on 91/47/8295   Method used:   Telephoned to ...       [facility]* (retail)       [address]       [place], [place]  [address]       Ph: [phone]       Fax: [phone]   RxID:   [id number]   Appended Document: refills put in as telephoned in in error- not done
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
## 2010-12-30 ENCOUNTER — Ambulatory Visit (INDEPENDENT_AMBULATORY_CARE_PROVIDER_SITE_OTHER): Payer: MEDICARE | Admitting: Internal Medicine

## 2010-12-30 ENCOUNTER — Telehealth: Payer: Self-pay | Admitting: *Deleted

## 2010-12-30 DIAGNOSIS — R42 Dizziness and giddiness: Secondary | ICD-10-CM

## 2010-12-30 NOTE — Telephone Encounter (Signed)
He is on a lot of medications that could contribute to dizziness but it would be worrisome for dizziness on Coumadin with having a possible GI bleed and his hemoglobin being low he should present to the laboratory for a complete blood count as soon as possible either this evening or tomorrow morning. If he is having bloody stools dark stools or greasy stools he should go to the emergency room for evaluation as soon as possible

## 2010-12-30 NOTE — Telephone Encounter (Signed)
Pt will come this afternoon for CBC with diff.

## 2010-12-30 NOTE — Telephone Encounter (Signed)
Pt wants Dr. Lovell Sheehan to look over his medication, and see if there is anything he could be taking that would make him dizzy.

## 2010-12-31 LAB — CBC WITH DIFFERENTIAL/PLATELET
Basophils Absolute: 0.1 10*3/uL (ref 0.0–0.1)
Basophils Relative: 1.3 % (ref 0.0–3.0)
HCT: 36.8 % — ABNORMAL LOW (ref 39.0–52.0)
Hemoglobin: 12.8 g/dL — ABNORMAL LOW (ref 13.0–17.0)
Lymphocytes Relative: 24.1 % (ref 12.0–46.0)
Lymphs Abs: 1.9 10*3/uL (ref 0.7–4.0)
Monocytes Relative: 3.5 % (ref 3.0–12.0)
Neutro Abs: 5.3 10*3/uL (ref 1.4–7.7)
RBC: 3.85 Mil/uL — ABNORMAL LOW (ref 4.22–5.81)
RDW: 12.9 % (ref 11.5–14.6)

## 2010-12-31 NOTE — Progress Notes (Signed)
  Subjective:    Patient ID: [name], male    DOB: [birth year], 76 y.o.   MRN: [id number]  HPI opened in error    Review of Systems Opened in error    Objective:   Physical Exam    opened in error    Assessment & Plan:

## 2011-01-17 ENCOUNTER — Other Ambulatory Visit: Payer: Self-pay | Admitting: *Deleted

## 2011-01-17 DIAGNOSIS — S161XXA Strain of muscle, fascia and tendon at neck level, initial encounter: Secondary | ICD-10-CM

## 2011-01-17 MED ORDER — OXYCODONE-ACETAMINOPHEN 5-500 MG PO CAPS: 1.0000 | ORAL_CAPSULE | Freq: Three times a day (TID) | ORAL | Status: AC

## 2011-01-18 ENCOUNTER — Other Ambulatory Visit: Payer: MEDICARE | Admitting: Internal Medicine

## 2011-01-18 DIAGNOSIS — D649 Anemia, unspecified: Secondary | ICD-10-CM

## 2011-01-18 LAB — HEMOCCULT GUIAC POC 1CARD (OFFICE)
Card #2 Fecal Occult Blod, POC: NEGATIVE
Card #3 Fecal Occult Blood, POC: NEGATIVE

## 2011-01-27 ENCOUNTER — Encounter: Payer: Self-pay | Admitting: Internal Medicine

## 2011-01-28 ENCOUNTER — Ambulatory Visit: Payer: Self-pay | Admitting: Internal Medicine

## 2011-02-02 ENCOUNTER — Encounter: Payer: Self-pay | Admitting: Internal Medicine

## 2011-02-02 ENCOUNTER — Ambulatory Visit (INDEPENDENT_AMBULATORY_CARE_PROVIDER_SITE_OTHER): Payer: MEDICARE | Admitting: Internal Medicine

## 2011-02-02 DIAGNOSIS — I4891 Unspecified atrial fibrillation: Secondary | ICD-10-CM

## 2011-02-02 DIAGNOSIS — M109 Gout, unspecified: Secondary | ICD-10-CM

## 2011-02-02 DIAGNOSIS — E119 Type 2 diabetes mellitus without complications: Secondary | ICD-10-CM

## 2011-02-02 DIAGNOSIS — I10 Essential (primary) hypertension: Secondary | ICD-10-CM

## 2011-02-02 LAB — CBC WITH DIFFERENTIAL/PLATELET
Basophils Relative: 0.6 % (ref 0.0–3.0)
Hemoglobin: 13.1 g/dL (ref 13.0–17.0)
Lymphocytes Relative: 18.8 % (ref 12.0–46.0)
MCHC: 34.6 g/dL (ref 30.0–36.0)
MCV: 93.5 fl (ref 78.0–100.0)
RBC: 4.05 Mil/uL — ABNORMAL LOW (ref 4.22–5.81)
RDW: 13.3 % (ref 11.5–14.6)

## 2011-02-02 LAB — URIC ACID: Uric Acid, Serum: 6.4 mg/dL (ref 4.0–7.8)

## 2011-02-02 LAB — HEMOGLOBIN A1C: Hgb A1c MFr Bld: 9 % — ABNORMAL HIGH (ref 4.6–6.5)

## 2011-02-02 MED ORDER — OXYCODONE-ACETAMINOPHEN 5-500 MG PO CAPS: 1.0000 | ORAL_CAPSULE | Freq: Three times a day (TID) | ORAL | Status: DC

## 2011-02-02 NOTE — Progress Notes (Signed)
Addended by: Rossie Muskrat on: 02/02/2011 04:20 PM   Modules accepted: Orders

## 2011-02-02 NOTE — Progress Notes (Signed)
Subjective:    Patient ID: [name], male    DOB: Nov 23, 1934, 76 y.o.   MRN: [id number]  HPI  patient is a 76 year old white male with a history of chronic atrial fibrillation on Coumadin therapy who presented for a pro time today and we will address adjusting his Coumadin as indicated. He also has adult-onset diabetes and chronic back pain.  back pain has been moderately well controlled and he has not asked for an early refill on his pain medications.  He has been walking a regular basis and this has helped his back pain.  His blood pressure is well controlled he states his atrial fibrillation appears to be controlled with the assistance of Pop patient's shortness of breath or any acute heart failure symptoms.  His weight is stable his blood sugars have ranged in the 120 to :30 range which should interpret into an A1c between 6 and 6.5    Review of Systems  Constitutional: Negative for fever and fatigue.  HENT: Negative for hearing loss, congestion, neck pain and postnasal drip.   Eyes: Negative for discharge, redness and visual disturbance.  Respiratory: Negative for cough, shortness of breath and wheezing.   Cardiovascular: Negative for leg swelling.  Gastrointestinal: Negative for abdominal pain, constipation and abdominal distention.  Genitourinary: Negative for urgency and frequency.  Musculoskeletal: Negative for joint swelling and arthralgias.  Skin: Negative for color change and rash.  Neurological: Negative for weakness and light-headedness.  Hematological: Negative for adenopathy.  Psychiatric/Behavioral: Negative for behavioral problems.       Past Medical History  Diagnosis Date  . Fatigue   . Overweight   . Syncope and collapse   . GERD (gastroesophageal reflux disease)   . Right bundle branch block   . Unspecified venous (peripheral) insufficiency   . Phlebitis and thrombophlebitis of superficial vessels of lower extremities   . Cellulitis and abscess  of leg, except foot   . Backache, unspecified   . Encounter for long-term (current) use of anticoagulants   . Type II or unspecified type diabetes mellitus without mention of complication, not stated as uncontrolled   . Unspecified venous (peripheral) insufficiency   . Hypertension   . Gout, unspecified   . Heart failure   . Atrial fibrillation   . Esophageal stricture   . Colon polyps   . Diverticulosis    Past Surgical History  Procedure Date  . Laminectomy   . Lumbar fusion   . Rotator cuff repair   . Ruptured rt rectus muscle   . Eye surgery     reports that he has never smoked. He does not have any smokeless tobacco history on file. He reports that he drinks alcohol. He reports that he does not use illicit drugs. family history includes Heart disease in his father and Pneumonia in his mother. Allergies  Allergen Reactions  . Morphine     REACTION: rash, irritability    Objective:   Physical Exam  Constitutional: He appears well-developed and well-nourished.  HENT:  Head: Normocephalic and atraumatic.  Eyes: Conjunctivae are normal. Pupils are equal, round, and reactive to light.  Neck: Normal range of motion. Neck supple.  Cardiovascular:        Irregular rate and rhythm no carotid bruits detected 1+ edema lower extremities  Pulmonary/Chest: Effort normal and breath sounds normal.  Abdominal: Soft. Bowel sounds are normal.          Assessment & Plan:   his diabetes appears to be in moderate  Will monitor her A1c today we emphasized need to be regular diabetic meals and to exercise on regular basis.   his gout has not flared recently however uric acid should be obtained 2C tonight he needs to be on chronic uric acid lowering medications.   his blood pressure is well controlled on the current medications.  His Coumadin dose will be adjusted based on protime drawn today his atrial fibrillation appears to be stable and rate he has no signs of acute congestive  heart failure do to rate out of control his weight is stable but we encouraged weight loss we discussed his diabetes and travel he has a trip overseas which we recommended that he prepare a diabetic pack for his trip including his insulin insulin needles and glucose tabs.

## 2011-02-03 LAB — DIGOXIN LEVEL: Digoxin Level: 1.1 ng/mL (ref 0.8–2.0)

## 2011-02-06 LAB — GLUCOSE, CAPILLARY: Glucose-Capillary: 226 mg/dL — ABNORMAL HIGH (ref 70–99)

## 2011-02-09 ENCOUNTER — Telehealth: Payer: Self-pay | Admitting: *Deleted

## 2011-02-09 NOTE — Telephone Encounter (Signed)
Wants to take 3 Etodolac and 3 Oxycodone daily.  Dr. Lovell Sheehan said absolutely NOT.  Pt notified.

## 2011-02-15 ENCOUNTER — Telehealth: Payer: Self-pay | Admitting: Internal Medicine

## 2011-02-15 NOTE — Telephone Encounter (Signed)
Pt requests a call back concerning results of lab work on 02/02/2011.

## 2011-02-16 NOTE — Telephone Encounter (Signed)
Please advise 

## 2011-02-16 NOTE — Telephone Encounter (Signed)
His blood count is improved his diabetes has worsened slightly so needs to watch his diet better and avoid sweets his motto should be limiting things that are white, wheat and things that are sweet. His digoxin level was good the main issue is his A1c elevation.

## 2011-02-16 NOTE — Telephone Encounter (Signed)
PT INFORMED

## 2011-02-27 LAB — PROTIME-INR
INR: 1.9 — ABNORMAL HIGH (ref 0.00–1.49)
Prothrombin Time: 22.9 seconds — ABNORMAL HIGH (ref 11.6–15.2)

## 2011-02-27 LAB — GLUCOSE, CAPILLARY: Glucose-Capillary: 133 mg/dL — ABNORMAL HIGH (ref 70–99)

## 2011-02-27 LAB — CBC
RBC: 3.84 MIL/uL — ABNORMAL LOW (ref 4.22–5.81)
WBC: 8 10*3/uL (ref 4.0–10.5)

## 2011-03-09 ENCOUNTER — Ambulatory Visit (INDEPENDENT_AMBULATORY_CARE_PROVIDER_SITE_OTHER): Payer: MEDICARE | Admitting: Internal Medicine

## 2011-03-09 DIAGNOSIS — I4891 Unspecified atrial fibrillation: Secondary | ICD-10-CM

## 2011-03-09 NOTE — Patient Instructions (Signed)
Same dose 

## 2011-03-12 ENCOUNTER — Other Ambulatory Visit: Payer: Self-pay | Admitting: Internal Medicine

## 2011-03-23 ENCOUNTER — Telehealth: Payer: Self-pay | Admitting: Internal Medicine

## 2011-03-23 NOTE — Telephone Encounter (Signed)
Per dr Lovell Sheehan- no ov before any increased pain medication

## 2011-03-23 NOTE — Telephone Encounter (Signed)
Pt called and is req a slight increase of dosage for Oxycodone. Pt says that the 5-500mg  is not strong enough. Pt only has 3 pills remaining.

## 2011-03-23 NOTE — Telephone Encounter (Signed)
i guess he will try again?

## 2011-03-24 ENCOUNTER — Other Ambulatory Visit: Payer: Self-pay | Admitting: *Deleted

## 2011-03-24 MED ORDER — OXYCODONE-ACETAMINOPHEN 5-500 MG PO CAPS: 1.0000 | ORAL_CAPSULE | Freq: Three times a day (TID) | ORAL | Status: DC

## 2011-03-25 NOTE — Telephone Encounter (Signed)
Pt states just wants script as usual- give to pt

## 2011-04-04 ENCOUNTER — Other Ambulatory Visit: Payer: Self-pay | Admitting: Internal Medicine

## 2011-04-05 NOTE — Assessment & Plan Note (Signed)
[facility]                         ELECTROPHYSIOLOGY OFFICE NOTE   NAME:Lograsso, KAYVION ARNESON                     MRN:          [id number]  DATE:10/03/2007                            DOB:          26-Aug-1935    The patient continues to complain of orthostatic dizziness.  This has  been progressive.   We have been trying to work on getting his INR therapeutic to undertake  cardioversion to see if it ameliorates his symptoms at all; we have not  achieved very much as his INR remains only 1.5 with just a recent up-  titration of his Coumadin.  He is to follow up at [facility] in  [place] in a couple of weeks' time.   His echocardiogram done intercurrently is not particularlysanguine in  this regard with a left atrial dimension of greater than 5.5 cm.   MEDICATIONS:  1. Diltiazem.  2. Lanoxin.  3. Atenolol for rate control.  4. Glyburide.  5. Metformin for his diabetes.  6. Benazepril.  7. Furosemide.  8. Coumadin.  9. Flomax has recently been added.   PHYSICAL EXAMINATION:  VITAL SIGNS:  Blood pressure today was 122/73,  pulse was 99 and irregular.  We did not have a chance to do orthostatics  as I was running late and he needed to leave.  LUNGS:  Clear.  HEART:  Sounds were irregular.  EXTREMITIES:  Trace edema.   Electrocardiogram dated today demonstrated atrial fibrillation at a rate  of 88 beats per minute with normal axis, right bundle branch block, and  normal QT interval.   IMPRESSION:  1. Atrial fibrillation - persistent.  2. Left atrial enlargement.  3. Thromboembolic risk factors notable for diabetes and hypertension.  4. Normal left ventricular function.  5. Orthostatic intolerance aggravated by the recent addition of      Flomax, but occurring in the context of diabetes and likely      autonomic neuropathy.   The patient's orthostatic symptoms are his biggest problem.  I do not  know whether it is the Flomax or his  autonomic neuropathy.  As I noted  above, he needed to leave so I did not have a chance to explore this  very far.   The other thought was whether the atrial fibrillation is contributing;  we will plan to undertaken cardioversion once his INR has been  therapeutic for 3-4 weeks.  In anticipation, I will see him again in 8  weeks' time and hopefully he will have been therapeutic at that  juncture.  He may also need consideration for the discontinuation of his  Flomax if his orthostatic symptoms persist.     [name], MD, [facility]  Electronically Signed    SCK/MedQ  DD: 10/03/2007  DT: 10/04/2007  Job #: [id number]   cc:   Stacie Glaze, MD

## 2011-04-05 NOTE — Discharge Summary (Signed)
[name], [name]              ACCOUNT NO.:  [id number]   MEDICAL RECORD NO.:  [id number]          PATIENT TYPE:  INP   LOCATION:  3709                         FACILITY:  [facility]   PHYSICIAN:  Valerie A. Felicity Coyer, MDDATE OF BIRTH:  [birth year]   DATE OF ADMISSION:  01/18/2008  DATE OF DISCHARGE:  01/22/2008                               DISCHARGE SUMMARY   DISCHARGE DIAGNOSES:  1. Diarrhea, likely secondary to viral gastroenteritis.  2. Uncontrolled type 2 diabetes.  3. Atrial fibrillation with rapid ventricular response.   HISTORY OF PRESENT ILLNESS:  [name] is a 76 year old white male  with history of chronic systolic heart failure, atrial fibrillation and  hypertension who presented to the emergency room on day of admission  with a 4-day history of watery stools.  The patient reported 1 episode  of vomiting and up to 20 watery stools per day until given Imodium in  the ER, which relieved his symptoms.  The patient reported dizziness on  standing.  Denied any headache, blurred vision, chest pain, shortness of  breath, abdominal pain or nausea.  The patient also reported  urinary  incontinence that began on the day prior to admission but denied any  pain with urination.  The patient also admitted to bilateral lower  extremity weakness and pain for several weeks to months that is  currently being evaluated by Dr. Ethelene Hal.  He states he has not taken any  of his medications in several days due to symptoms.  Upon evaluation in  the ER, the patient was found to have a sodium of 128 with a blood sugar  of 426,  decreased to 199 after IV insulin given in the emergency room.  The patient also in atrial fibrillation with RVR.   PAST MEDICAL HISTORY:  1. Atrial fibrillation, on chronic anticoagulation.  2. Chronic right bundle branch block.  3. Type 2 diabetes.  4. Hypertension.  5. GERD.  6. Right rotator cuff tear.  7. Status post L4-L5 fusion.  8. Gout.  9. Chronic systolic  heart failure with LVEF 60% to 65% in October      2008.  10.Chronic venous insufficiency.  11.BPH.  12.Chronic low back pain.  13.Syncopal episode in January 2009, status post loop recorder per EP.   COURSE OF HOSPITALIZATION:  1. Persistent diarrhea.  The patient was admitted to telemetry floor.      Empiric antibiotics were held at time of admission.  The patient      was afebrile with normal white cell count with no abdominal pain.      Diarrhea slowly improved throughout hospitalization with stool      cultures negative for C. diff toxin.  The patient's hyponatremia      likely secondary to volume loss in the setting of diarrhea,      resolved with IV fluids.  An acute abdominal series was negative      for any acute abnormality with nonobstructive bowel gas pattern and      no free air.  2. Uncontrolled diabetes.  The patient's blood sugars controlled with  sliding scale insulin and with Lantus.  The A1c during this      admission 11.0.  He was not acidotic on admission.  The patient      discharged home on increased dose of Lantus from prior to      admission.  Close followup was recommended with his primary care      physician as it is very likely the patient will need continuous      titration of medications.  3. Atrial fibrillation with rapid ventricular response.  As mentioned      in the HPI, the patient had not taken medications x4 days.  In      addition to volume loss in setting of problem number one the      patient placed on Cardizem drip in the ER and easily transitioned      to p.o. Cardizem with rate controlled at time of discharge.  The      patient continued on Coumadin per protocol.   MEDICATIONS AT THE TIME OF DISCHARGE:  1. Lantus 50 units subcu q.h.s.  2. NovoLog sliding scale with each meal and at bedtime as prior to      admission.  3. Coumadin 5 mg p.o. daily or as instructed.  4. Protonix 40 grams p.o. daily.  5. Digoxin 0.125 mg p.o. daily.  6.  Cardizem CD 120 mg p.o. daily.  7. Lasix 40 mg p.o. daily.  8. Lotensin 20 mg p.o. daily.  The patient asked to hold this      medication until followup with primary care physician.  9. Propranolol 60 mg p.o. daily.  10.Glucophage 500 mg p.o. b.i.d.  11.Flomax 0.4 mg p.o. daily.  12.Oxycodone 5 mg q.6 h p.r.n. for pain.   DISPOSITION:  The patient instructed to follow up with Dr. Darryll Capers  on Friday,  March 6 at 2:30 p.m., at which time he will need his  Coumadin levels checked.  His discharge INR is 2.2.  The patient also  scheduled for followup with Dr. Graciela Husbands on Friday, April 24, at 3:00 p.m.  He is instructed to stop taking his glyburide.      Cordelia Pen, NP      Raenette Rover. Felicity Coyer, MD  Electronically Signed    LE/MEDQ  D:  02/08/2008  T:  02/08/2008  Job:  [id number]   cc:   Stacie Glaze, MD  [name], MD, [facility]

## 2011-04-05 NOTE — Letter (Signed)
August 01, 2007     RE:  JAYMIN, WALN  MRN:  [id number]  /  DOB:  June 22, 1935   To Whom It May Concern:   The patient has atrial fibrillation.  He comes in now with a very rapid  rate, having had adequate rate control a couple of months ago.  We are  trying to modify his drugs to accomplish adequate rate control; however,  given the very rapid rates, we worry about deterioration of left  ventricular systolic function and the development of a tachycardia  induced cardiomyopathy.  To that end, I request the approval for one or  two further Holter monitors to make sure that we can document adequate  rate control.   Thank you for your consideration.    Sincerely,      [name], MD, [facility]  Electronically Signed    SCK/MedQ  DD: 08/01/2007  DT: 08/02/2007  Job #: [id number]

## 2011-04-05 NOTE — Discharge Summary (Signed)
[name], [name]              ACCOUNT NO.:  [id number]   MEDICAL RECORD NO.:  [id number]          PATIENT TYPE:  OIB   LOCATION:  2899                         FACILITY:  [facility]   PHYSICIAN:  [name], MD, FACCDATE OF BIRTH:  September 24, 1935   DATE OF ADMISSION:  06/03/2009  DATE OF DISCHARGE:  06/03/2009                               DISCHARGE SUMMARY   PRIMARY CARE Ananiah Maciolek:  Stacie Glaze, MD   NEUROLOGIST:  Genene Churn. Love, MD   This patient has intolerance to BETA-BLOCKERS including NADOLOL,  ATENOLOL, INDERAL, and PINDOLOL.   TIME FOR THIS DICTATION AND EXAMINATION:  Greater than 35 minutes.   FINAL DIAGNOSES:  1. Implantable loop recorder interrogated at office visit on January 20, 2009.  The battery is extinct.  2. The patient was discharged after explantation of loop recorder on      June 03, 2009.  3. Loop recorder originally implanted for syncope of unclear etiology      on December 21, 2006.  4. The patient had a syncope in January 2008 with trauma.      a.     Comminuted blowout fracture of the right orbital floor.      b.     Diplopia secondary to orbital floor disruption.      c.     Repair of blowout fracture on January 03, 2007.   SECONDARY DIAGNOSES:  1. Permanent atrial fibrillation/chronic Coumadin.  2. History of cerebrovascular accident with left-sided residual      weakness.  3. Right bundle branch block.  4. Diabetes.  5. Hypertension.  6. Gastroesophageal reflux disease.  7. Right rotator cuff repair.  8. Right rectus muscle tear.  9. History of L4-L5 fusion.   PROCEDURE:  June 03, 2009, explantation of loop recorder by Dr. Sherryl Manges.   BRIEF HISTORY:  [name] is a 76 year old male.  He has a history of  syncope with significant trauma in January 2008.  He also has a history  of atrial fibrillation, but telemetry and wearable monitors were unable  to establish either tachycardia or bradycardia etiology for syncope.  The patient  had a loop recorder implanted in December 21, 2006.  He had  no further syncope and at this time, the loop recorder during office  visit interrogation in March 2010 is extinct, the battery has run out.  Currently, the patient wishes to have the loop recorder extracted.  This  will be done at the earliest convenience.   HOSPITAL COURSE:  The patient presents electively on June 03, 2009.  The  device was extracted by Dr. Graciela Husbands, and the patient discharged on the  same day.  The patient is asked to keep his incision dry for the next 5  days and to sponge bathe until Tuesday June 09, 2009.  He is asked to  remove the bulky bandage, the morning of June 04, 2009, and leave the  incision open to the air.  He has a followup with [facility] to check the incision on Wednesday, June 17, 2009, at 10  o'clock.   Medications at discharge have not changed, and they are as follows:  1. Coumadin 5 mg daily.  2. Lantus 30 units subcu injection every evening.  3. Digoxin 0.125 mg daily.  4. Zantac 300 mg daily.  5. Diltiazem 240 mg daily.  6. Doxazosin 4 mg daily.  7. Furosemide 40 mg daily.  8. Colchicine 0.6 mg daily.  9. OxyContin 20 mg twice daily.  10.Celebrex 200 mg daily.  11.Glucophage 500 mg b.i.d.  12.Probenecid 500 mg daily.  13.NovoLog per sliding scale as directed.  14.Oxycodone 5 mg as needed.   LABORATORY STUDIES:  This admission, the PTT is 42, the INR is 1.9, and  protime is 22.9.  Complete blood count:  White cells are 8, hemoglobin  12.6, hematocrit 36.0, and platelets of 146.      Maple Mirza, [place]      [name], MD, [facility]  Electronically Signed    GM/MEDQ  D:  06/03/2009  T:  06/04/2009  Job:  [id number]   cc:   Stacie Glaze, MD  Genene Churn. Love, M.D.

## 2011-04-05 NOTE — Assessment & Plan Note (Signed)
[facility]                                 ON-CALL NOTE   NAME:Tousley, JORDON KRISTIANSEN                     MRN:          [id number]  DATE:05/16/2008                            DOB:          Apr 19, 1935    Phone numbers [phone] and the caller was [facility] pharmacy.  The patient has questions about dosing.  He takes insulin, has been  given samples at the office for approximately the last 6 months and  evidently misunderstood directions because he has been using NovoLog as  his primary regular dose where it was supposed to be a sliding scale and  is taking Lantus SoloStar as his sliding scale where he was supposed to  be on 15 units subcu.  His sliding scale was no insulin from 0 to 200, 5  units from 200-300, and over 300 it was supposed to be 15 units.  Pharmacist picked up that the patient has actually been taking the  insulin wrong, instead of using Lantus as a regular dose and NovoLog as  sliding scale, he has been doing the other way round.   ASSESSMENT:  Improper use of insulin.   PLAN:  I told the pharmacist to tell the patient to start taking Lantus  at 20 units a day and use his sliding scale NovoLog as it was prescribed  and then check his glucose twice a day as he has been and get an  appointment next week to be seen by Dr. Lovell Sheehan.   PRIMARY CARE Derya Dettmann:  Dr. Lovell Sheehan.     Arta Silence, MD  Electronically Signed    RNS/MedQ  DD: 05/16/2008  DT: 05/17/2008  Job #: [id number]

## 2011-04-05 NOTE — Assessment & Plan Note (Signed)
[facility]                            CARDIOLOGY OFFICE NOTE   NAME:[name], [name]                     MRN:          [id number]  DATE:02/06/2008                            DOB:          Jan 08, 1935    PRIMARY CARE PHYSICIAN:  Stacie Glaze, MD   PRIMARY CARDIOLOGIST:  [name], MD, [facility]   HISTORY:  Mr. Keep is seen today post hospital.  The patient was  admitted with viral illness, significant diarrhea requiring  hospitalization.  He was found to be in rapid atrial fibrillation and  had low blood pressure when he was admitted.  I have an ER note but I do  not have any discharge summary.  It has not been dictated.  The patient  was admitted to telemetry.  Medications were adjusted and he had a 5 day  hospital stay.  Overall he is feeling better.  He does not notice when  his heart when in atrial fibrillation is out of control.  He is pretty  asymptomatic with this.  He does have a loop recorder.   CURRENT MEDICATIONS:  1. Lanoxin 0.125 mg daily.  2. Pantoprazole 40 mg daily.  3. Flomax 0.4 mg daily.  4. Warfarin as directed.  5. Furosemide 40 mg daily.  6. Diltiazem 120 mg daily.  7. Propranolol 120 mg daily.  8. Metformin 500 mg b.i.d.   PHYSICAL EXAM:  GENERAL:  This is a pleasant 76 year old white male in  no acute distress.  VITAL SIGNS:  Blood pressure 114/71, pulse 89, weight 195.  NECK:  Neck is without JVD, HJR, bruit or thyroid enlargement.  LUNGS:  Clear anterior and posterolateral.  HEART:  Irregular rate and rhythm at 89 beats per minute.  Normal S1 and  S2 with 1/6 systolic ejection murmur at the left sternal border.  ABDOMEN:  Soft without organomegaly, masses, lesions or abnormal  tenderness.  EXTREMITIES:  Without cyanosis, clubbing or edema.  He has good distal  pulses.   EKG atrial fibrillation at 88 beats per minute, right bundle branch  block.  No acute change.   IMPRESSION:  1. Chronic atrial  fibrillation with rapid ventricular response in the      setting of diarrhea and viral illness, resolved.  2. Normal LV function.  3. Diabetes mellitus.  4. Hypertension.  5. Coumadin therapy.  6. Loop recorder.   PLAN:  At this time, the patient is stable from a cardiac standpoint.  His atrial fibrillation is controlled today.  He does have a followup  appointment with Dr. Graciela Husbands in April which I have asked him to keep and  he can discuss further treatment of his atrial fibrillation.      Jacolyn Reedy, PA-C  Electronically Signed      Luis Abed, MD, [facility]  Electronically Signed   ML/MedQ  DD: 02/06/2008  DT: 02/06/2008  Job #: [id number]

## 2011-04-05 NOTE — Assessment & Plan Note (Signed)
[facility]                         ELECTROPHYSIOLOGY OFFICE NOTE   NAME:Cross, [name] DEBORD                     MRN:          [id number]  DATE:04/19/2007                            DOB:          25-Feb-1935    [name] comes in.  He is doing okay.  Interrogation of his loop  recorder demonstrated no auto-detected event with no symptoms and was  actually notable for undersensing in the context of rapid atrial  fibrillation.   When I had seen [name] last we had given him a prescription for a  couple of different beta blockers.  He was to call us after we tried him  on Inderal LA and the atenolol.  He failed to do that.  The Holter  monitoring was also not obtained.   He did take the digoxin.  His other medications include:  1. Metformin 500 mg b.i.d.  2. Benazepril 20 mg.  3. Glyburide.  4. Diltiazem.  5. Coumadin.   PHYSICAL EXAMINATION:  VITAL SIGNS:  On examination today his blood  pressure was 122/78, his pulse was about 110 by auscultation and  irregular.  LUNGS:  Clear.  CARDIAC:  Heart sounds were irregular as noted.  EXTREMITIES:  Without edema.   His loop recorder was as noted.   IMPRESSION:  1. Atrial fibrillation, persistent, permanent.  2. Thromboembolic risk factors notable for diabetes and hypertension.  3. History of syncope.  4. Normal left ventricular function.   [name] has atrial fibrillation with a rapid ventricular response.  I have given him a prescription today for Inderal LA 120 mg.  He will  take it for about 4 weeks.  We will get a Holter monitor and we will see  what his heart rate control looks like and I will see him in 5 weeks'  time.     [name], MD, [facility]  Electronically Signed    SCK/MedQ  DD: 04/19/2007  DT: 04/19/2007  Job #: [id number]   cc:   Stacie Glaze, MD

## 2011-04-05 NOTE — Letter (Signed)
January 20, 2009     RE:  [name], Cross  MRN:  [id number]  /  DOB:  1935/01/12   Dear [name]:   [name] comes in today in followup for syncope in the setting of  atrial fibrillation, previously normal left ventricular function and  syncope on his loop-recorder implant.   He comes in with a complaint that about 3 weeks ago he developed  weakness that has persisted in his left hand and left face and some pain  in his left leg.  His Coumadin had been managed in our office for a long  time until he got frustrated with the co-pays.  At that point, he took  his Coumadin checks to your office and when I asked him the last time it  was done, he said it was about 6 months ago.  I do not have any  validation of that.  (See below).   He is also had some headache.  He has had no recurrent syncope.   His medications include warfarin 5, furosemide 40, metformin 500 b.i.d.,  NovoLog, doxazosin 4, ranitidine 300, digoxin 0.125, Lantus and  diltiazem 240.   PHYSICAL EXAMINATION:  VITAL SIGNS:  On examination, his blood pressure  was 133/69 with a modest orthostatic drop to 119/74.  His heart rate was  recorded between 68 and 50 but I suspect this is a pulsatile measurement  as opposed to a heart rate.  On auscultation, his heart rate was 104 and  irregular.  NECK:  His neck veins were 7.  LUNGS:  His lungs were clear.  HEART:  Sounds were irregular with a 2/6 murmur.  ABDOMEN:  Soft.  EXTREMITIES:  Without edema.  NEUROLOGIC:  He had clear weakness in the opening of his hand.  He had  weakness at his wrist extender.  He had weakness of his left facial  muscles.  I could not appreciate weakness at his left elbow or his left  hip flexor.   CT scan was undertaken which demonstrated no bleeds.  Electrocardiogram  demonstrated atrial fibrillation at 104 with intervals of - /0.13/0.37  with an axis of -66 and right bundle branch block.  There is low volts  in the limb leads.   INR was  1.7.   IMPRESSION:  1. Permanent atrial fibrillation with a modestly rapid ventricular      response.  2. Intercurrent stroke with residual left-sided weakness.  3. Negative computerized tomography scan.  4. Subtherapeutic international normalized ratio.  5. Syncope with a loop recorder interrogation demonstrating that the      battery was extinct.   [name], I took the privilege of calling Dr. Melbourne Cross to get direction for  [name] and he agreed to see [name].  I increased his  Coumadin from 5 every day to 5 Sunday, Tuesday, Thursday, Saturday and  7.5 Monday, Wednesday, Friday.  I have asked that he get his INR checked  in your office in 10 days' time.  We will plan to schedule explantation  of his loop recorder more electively.  I will also plan to increase his  Diltiazem from 240 to 480 mg a day for augmented rate control.   Thank you very much for allowing [place] to participate in his care.    Sincerely,      [name], MD, [facility]  Electronically Signed    SCK/MedQ  DD: 01/20/2009  DT: 01/20/2009  Job #: [id number]   CC:  Genene Churn. Love, M.D.

## 2011-04-05 NOTE — Op Note (Signed)
[name], [name]              ACCOUNT NO.:  [id number]   MEDICAL RECORD NO.:  [id number]          PATIENT TYPE:  OIB   LOCATION:  2899                         FACILITY:  [facility]   PHYSICIAN:  [name], MD, FACCDATE OF BIRTH:  10-18-1935   DATE OF PROCEDURE:  06/03/2009  DATE OF DISCHARGE:  06/03/2009                               OPERATIVE REPORT   PREOPERATIVE DIAGNOSIS:  Previously implanted loop recorder now at end  of life.   POSTOPERATIVE DIAGNOSIS:  Previously implanted loop recorder now at end  of life.   PROCEDURE:  Explantation of loop recorder.   Following obtaining informed consent, the patient was brought to the  catheterization laboratory and placed on the table in supine position.  After routine prep and drape, lidocaine was infiltrated along the line  of previous incision, and it was carried down to the layer of the device  pocket using sharp dissection.  The pocket was opened.  The retaining  sutures were cut and carefully removed in their entirety.  The device  was explanted.  The anterior and posterior aspect of the pocket was  apposed with a 3-0 Vicryl suture.  The wound was then closed in 3 layers  in normal fashion after irrigation and hemostasis.  A benzoin dressing  was applied.  Needle counts, sponge counts, and instrument counts were  correct at the end of the procedure according to the staff.  The patient  tolerated the procedure without apparent complication.      [name], MD, [facility]  Electronically Signed     [name], MD, [facility]  Electronically Signed    SCK/MEDQ  D:  06/03/2009  T:  06/04/2009  Job:  [id number]

## 2011-04-05 NOTE — Assessment & Plan Note (Signed)
[facility]                         ELECTROPHYSIOLOGY OFFICE NOTE   NAME:[name], [name]                     MRN:          [id number]  DATE:09/03/2007                            DOB:          1935-03-24    [name] comes in with atrial fibrillation.  He was supposed to have  had a antecedent Holter monitor which unfortunately did not get ordered.  He now describes exercise incapacity and as I reviewed with him the note  from September, I had said then that I was surprised that he was as  functionally able as he was but apparently he is now recognizing that he  has limited his activity significantly.  He is short of breath walking  up from the soccer field at [facility].   He also complains of significant daytime somnolence.  He denies snoring  and early a.m. headaches.  He is aware of sleep apnea as his wife is on  a CPAP machine.   His blood pressure is a little bit low today but he is not having  significant problems with lightheadedness.   MEDICATIONS:  Include:  1. Coumadin.  2. Diltiazem 120.  3. Glyburide.  4. Benazepril.  5. Metformin.  6. Digoxin 0.125.  7. Furosemide.  8. Atenolol 100.   We had talked about putting him back on Inderal at the last visit, but  apparently that did not happen.   PHYSICAL EXAMINATION:  VITAL SIGNS:  His blood pressure was 90/48.  His  pulse was 84 and irregular.  LUNGS:  Clear.  HEART:  Sounds were irregular without significant murmurs.  EXTREMITIES:  Had trace edema.  SKIN:  Warm and dry.   His loop recorder was not interrogated.   IMPRESSION:  1. Atrial fibrillation with a previously rapid ventricular response.  2. Normal left ventricular function.  3. Question of left atrial dimensions.  4. Atrial fibrillation - persistent.  5. Question of obstructive sleep apnea.  6. Modest hypotension.  7. Diabetes.   PLAN:  1. We will need first to check [name]'s INR to see if he is  therapeutic on his Coumadin before cardioversion.  I have reviewed      with him the potential benefits of this.  2. We will also check a 2D echo to assess his left atrial dimension      and to see whether he is likely to maintain a sinus rhythm at all      in the absence of an antiarrhythmic drug and if not what drug might      be most beneficial.  3. We will plan to undertake a sleep study and I will forward the      results to Dr. Darryll Capers.     [name], MD, [facility]  Electronically Signed    SCK/MedQ  DD: 09/03/2007  DT: 09/03/2007  Job #: [id number]   cc:   Stacie Glaze, MD

## 2011-04-05 NOTE — Letter (Signed)
March 14, 2008    Stacie Glaze, M.D.  [address] [place], [place] [address]   RE:  [name], [name]  MRN:  [id number]  /  DOB:  07-Dec-1934   Dear [name]:   It was a pleasure seeing you in the office today.  [name] came in  for follow-up for his syncope and his atrial fibrillation.  He also has  orthostatic intolerance but has been relatively quiescent of late.  He  mentioned that he understood you to say that you were going to stop his  Coumadin (see below).   His main complaint is fatigue.  He wonders whether this is related some  of his medications.  These include diltiazem 120, propranolol 120,  furosemide 40, warfarin, Flomax, Lanoxin, metformin, and insulin.   On examination today, his blood pressure was 100/70, pulse was 75.  His lungs were clear.  Her heart sounds were regular.  His skin was warm and dry.  The extremities had no edema.   Interrogation of loop recorder demonstrated no specific events.   IMPRESSION:  1. Syncope.  2. Atrial fibrillation.  3. Fatigue.  4. Thromboembolic risk factors notable for diabetes and hypertension.  5. Normal left ventricular function, nonischemic Myoview.   [name], [name] atrial fibrillation is stable.  With his [place] score  of 2+, I think it is important that he be maintained on Coumadin  indefinitely in the absence of strong contraindication or proven  alternative therapy.   As relates to his fatigue, I suggest that we pursue an exclusion trial.  Initially, we will stop his Cardizem for 2 weeks and see how he does.  In the event that he is better, he let us know, and we will not resume  it.  In the event that he is no better, he will resume his Cardizem and  then wean himself off of his propranolol, taking 120 every other day for  2 weeks and then stop it altogether, and I will see him at the end of  the six-week period.   Thank you very much for allowing [place] to participate in his care.     Sincerely,      [name], MD, [facility]  Electronically Signed    SCK/MedQ  DD: 03/14/2008  DT: 03/14/2008  Job #: [id number]

## 2011-04-05 NOTE — Assessment & Plan Note (Signed)
[facility]                         ELECTROPHYSIOLOGY OFFICE NOTE   NAME:[name], [name]                     MRN:          [id number]  DATE:05/02/2008                            DOB:          November 03, 1935    [name] was seen a couple of months ago with complaints of fatigue.  We undertook a drug exclusion trial.  Cardizem discontinuation had no  impact on his symptoms.  He resumed it without any worsen of his  symptoms.  He then weaned himself off of propranolol and notes a  significant albeit modest improvement in sleep and cognition and energy.   The consequence of this is that his heart rate is running a little bit  faster with recorded heart rates now in the 100s.   His medications include:  1. Digoxin 0.125.  2. Diltiazem 120.  3. Doxazosin.  4. Ranitidine.  5. NovoLog.  6. Metformin.  7. Coumadin.  8. As well as Lasix.   On examination, his blood pressure today was 131/78 with a pulse of 101.  His weight was 202 pounds which is up about 5 pounds in the last 4  months.  His lungs were clear.  HEART:  Sounds were irregular without murmurs.  The abdomen was soft.  EXTREMITIES:  Had trace edema.   Interrogation of loop recorder demonstrated no intercurrent episodes.   Electrocardiogram demonstrated right bundle branch block and atrial  fibrillation at a rate of 96.  The intervals were 0.13/0.39.   IMPRESSION:  1. Syncope.  2. Status post loop recorder.  3. Atrial fibrillation - permanent with an excessively rapid      ventricular response.  4. Intolerance of multiple beta blockers including nadolol, atenolol,      Inderal and pindolol.  5. Obesity.  6. Diabetes.   Mr. Jupin is doing okay.  From a symptom point of view, I am bothered  by his relatively rapid rate.  Will continue on Lanoxin.  I have asked  him to double his Cardizem and have sent a prescription over to [facility] for 240 mg daily.   We will plan to see him  again in 6 months' time.   We will interrogate his loop recorder at that time.     [name], MD, [facility]  Electronically Signed    SCK/MedQ  DD: 05/02/2008  DT: 05/02/2008  Job #: [id number]   cc:   Stacie Glaze, MD

## 2011-04-05 NOTE — Assessment & Plan Note (Signed)
[facility]                               LIPID CLINIC NOTE   NAME:Jett, BRYSTON COLOCHO                     MRN:          [id number]  DATE:09/27/2007                            DOB:          [birth year]    Mr. Shenberger is a gentleman patient who has been seen in anticoagulation  clinic here on 2 occasions.  He presented for his last followup earlier  this week, at which time he verbalized his frustration that he had a  specialist co-pay when he was evaluated in our Coumadin Clinic.  After  discussing with Mr. Gopaul his options, which included continuing to  come here with his specialist co-pay versus returning to the [facility] under the care of Dr. Lovell Sheehan, the  patient elected to return to [facility].  Mr. Kibbe expressed concern  that he did not have Coumadin followup between May and his enrollment in  the Anticoagulation Clinic at [facility].  I spent approximately 30  minutes with Mr. Bradsher outlining that oral anticoagulation is a very  significant responsibility that he shares with his care providers.  I  reviewed teaching points with Mr. Savant including specifically that,  if he does not obtain an appointment or instructions on how to modify  his Coumadin regimen based on at least monthly INR values, it is his  responsibility to contact his physician Keidan Aumiller.  Mr. Marchitto expressed  continued frustration, but understanding of these recommendations.  Mr.  Delage also stated that he had been bumped off of Dr. Lovell Sheehan'  schedule.  I called and spoke with the office, and set up his followup  Coumadin appointment in later November as well as the first available  appointment with Dr. Lovell Sheehan, which is scheduled at this time for mid  December.  I asked him to contact me if I could be of additional  assistance, and have deactivated him from my rosters as his followup  will be scheduled at [place].      Shelby Dubin, PharmD, BCPS, CPP  Electronically Signed      [name], MD, [facility]  Electronically Signed   MP/MedQ  DD: 10/02/2007  DT: 10/03/2007  Job #: [id number]   cc:   Stacie Glaze, MD  [name], MD, [facility]

## 2011-04-05 NOTE — Assessment & Plan Note (Signed)
[facility]                         ELECTROPHYSIOLOGY OFFICE NOTE   NAME:[name], [name]                     MRN:          [id number]  DATE:08/01/2007                            DOB:          07/27/1935    Mr. Andal is seen today after having missed an appointment this  summer.  We had tried him on atenolol and Inderal earlier this spring  and summer to see which would be best tolerated, which would effect  adequate rate control.  A Holter monitor obtained in July, and to the  patient's best recollection he was on Inderal at that time, demonstrated  adequate rate control.  He then tried the atenolol and having missed the  intercurrent appointment just refilled the atenolol.  He comes in today  with a heart rate of 130.  Notably, he has had some recent edema, which  he treated with furosemide; overall his exercise tolerance is, he  thinks, somewhat better.   His medications currently include Coumadin, Glyburide, diltiazem,  Protonix, benazepril, metformin, digoxin and atenolol 100 mg.   On examination today, his blood pressure is 108/60 with a pulse of 138.  LUNGS:  Clear.  Heart sounds were irregular.  EXTREMITIES:  Trace edema.   IMPRESSION:  1. Atrial fibrillation with a rapid ventricular response.  2. Syncope with a previously-implanted loop recorder, still in.  3. Normal left ventricular function February 2008.   We will plan to try him back on Inderal to see if that does not effect  adequate rate control.   I am also concerned about whether there are secondary causes of his  tachycardia given the abrupt change.  To that end, we will plan to check  his thyroid as well as his CBC.  I am also surprised at his relatively  unimpaired functional capacity and I wonder whether he is just very  limited.  I am going to go ahead and check a BNP and a BMET as well.  We  will plan to arrange for a Holter monitor in about 4 weeks' time and I  will  see him in about 6 weeks.     [name], MD, [facility]  Electronically Signed    SCK/MedQ  DD: 08/01/2007  DT: 08/02/2007  Job #: [id number]

## 2011-04-05 NOTE — Assessment & Plan Note (Signed)
[facility]                                 ON-CALL NOTE   NAME:[name], [name]                       MRN:          [id number]  DATE:04/06/2007                            DOB:          27-Aug-1935    PRIMARY CARE PHYSICIAN:  Dr. Lovell Sheehan   [name] is a patient of Dr. Lovell Sheehan' who was seen by Dr. Cato Mulligan  today and was diagnosed with a staph infection of his left leg. He was  started on Avelox. The patient reports that he is on multiple  medications. He was concerned whether this medication would interact  with any of his current medications. He did state that he talked to the  pharmacist who has all of his medications on file and he was told there  was no contraindications with any of the medications he is currently  taking. His concern was more with Lasix, which he noted on the patient  information that was provided with the prescription. I advised the  patient that there is no contraindication with Avelox and Lasix,  although there may be a interaction which may require monitoring, but  not changing the medication. He is scheduled to follow up with Dr.  Lovell Sheehan on Tuesday. The patient was advised to monitor his symptoms and  if he has any concerns, he can obviously call us back.     Leanne Chang, M.D.  Electronically Signed    LA/MedQ  DD: 04/06/2007  DT: 04/07/2007  Job #: [id number]

## 2011-04-08 NOTE — Assessment & Plan Note (Signed)
[facility]                            CARDIOLOGY OFFICE NOTE   NAME:Jacobs, DEVYON KEATOR                     MRN:          [id number]  DATE:12/29/2006                            DOB:          02-Apr-1935    Primary cardiologist is Dr. Sherryl Manges.  Primary care physician is Dr.  Darryll Capers.   HISTORY OF PRESENT ILLNESS:  Mr. Gruenewald is a 76 year old male patient,  followed by Dr. Graciela Husbands, with a history of atrial fibrillation on chronic  Coumadin therapy as well as diabetes and hypertension, who was admitted  to [facility] in late January with an episode of syncope.  This  occurred shortly after urinating, and was felt to be postmicturition  syncope.  The patient had an echocardiogram that revealed diastolic  dysfunction, EF 50%.  RV function was mildly to moderately reduced,  right atrium was dilated, possible small to moderate pericardial  effusion posterior to the heart was noted, and there was also decreased  motion of the posterior mitral valve leaflet.  He eventually underwent  implantation of a loop recorder for unexplained syncope by Dr. Ladona Ridgel  January 31.  He suffered a right orbital blowout fracture when he fell  and has resultant diplopia.  He is seeing Dr. Suszanne Conners today and would like  to have the surgical repair done as soon as possible.  He needs to come  off Coumadin for this.  He was sent up to our office for further  evaluation.  He denies any chest pain or shortness of breath.  He denies  any further syncope.  This was actually his first episode of syncope  ever.  He did fall a couple of years ago shortly after having back  surgery.  He denies syncope at that time.  He denies orthopnea or  paroxysmal nocturnal dyspnea.  He denies palpitations.   CURRENT MEDICATIONS:  1. Coumadin as directed.  2. Diltiazem CD 120 mg daily.  3. Glyburide 2.5 mg twice daily.  4. Protonix daily.  5. Lasix 40 mg daily.  6. Benazepril 20 mg  daily.  7. Metformin 500 mg b.i.d.  8. Nadolol 10 mg daily.   ALLERGIES:  MORPHINE.   SOCIAL HISTORY:  Denies any tobacco abuse.  He has a previous history of  alcohol abuse.  He has not done this for quite some time.   FAMILY HISTORY:  Significant for congestive heart failure in his father.   REVIEW OF SYSTEMS:  Please see HPI.  He denies any fevers, chills,  cough, no hematochezia, hematuria or dysuria.  His other review of  systems are negative.   PHYSICAL EXAMINATION:  He is a well-developed, well-nourished male in no  distress.  Blood pressure 120/70, pulse 86, weight 223 pounds.  HEENT:  Unremarkable.  NECK:  Without JVD.  CARDIAC:  Normal S1, S2, regular rate and rhythm.  LUNGS:  Clear to auscultation bilaterally.  ABDOMEN:  Soft, nontender.  EXTREMITIES:  Without edema, calves are soft, nontender.  SKIN:  Warm and dry.  NEUROLOGIC:  Alert and oriented x3.  Cranial nerves II-XII are grossly  intact.   Electrocardiogram reveals atrial fibrillation with a ventricular  response of 78, right bundle branch block, inferior T wave inversions.   IMPRESSION:  1. Unexplained syncope.  2. Status post right orbital blowout fracture with resultant diplopia.  3. Chronic atrial fibrillation, on rate control therapy.      a.     Coumadin therapy.  4. History of congestive heart failure.  5. Right bundle branch block.  6. Diabetes mellitus.  7. Hypertension.  8. Gastroesophageal reflux disease.   PLAN:  The patient was also seen and examined by Dr. Corinda Gubler today.  He  does not have a history of stroke.  He does not have a history of a  mechanical valve.  In that case, it is acceptable for him to come off  Coumadin without any bridging heparin or Lovenox.  He is eager to get  his surgery done as quickly as possible.  He is going to be in touch  with Dr. Suszanne Conners to have it scheduled some time next week.  It is okay for  him to go ahead and start holding his Coumadin today with plans  for  tentative surgery some time next Tuesday or Wednesday.  Before we can  clear him for surgery, however, he will need to undergo adenosine  Myoview testing to rule out the possibility of ischemia.  We will get  that set up for Monday of next week, February 11.  We will be in touch  with Dr. Suszanne Conners regarding the results of that and whether or not he can be  cleared for surgery.   Of note, the patient is due to have his loop recorder wound checked next  week.  That appointment can be canceled.  His wound site is healing  well, there is no erythema or edema  and there is no discharge.  The Steri-Strips are still intact, and he  will allow those to fall off on their own.      Tereso Newcomer, PA-C  Electronically Signed      Cecil Cranker, MD, [facility]  Electronically Signed   SW/MedQ  DD: 12/29/2006  DT: 12/30/2006  Job #: [id number]   cc:   Stacie Glaze, MD

## 2011-04-08 NOTE — Op Note (Signed)
[name], [name]                        ACCOUNT NO.:  [id number]   MEDICAL RECORD NO.:  [id number]                   PATIENT TYPE:  INP   LOCATION:  2101                                 FACILITY:  [facility]   PHYSICIAN:  Hilda Lias, M.D.                DATE OF BIRTH:  1935-06-03   DATE OF PROCEDURE:  07/07/2004  DATE OF DISCHARGE:                                 OPERATIVE REPORT   PREOPERATIVE DIAGNOSIS:  Lumbar wound infection, status post L4-L5 fusion.   POSTOPERATIVE DIAGNOSIS:  Lumbar wound infection, status post L4-L5 fusion.   PROCEDURE:  I&D of lumbar wound.  Debridement.   SURGEON:  Hilda Lias, M.D.   INDICATIONS FOR PROCEDURE:  The patient was admitted by me on July 05, 2004, because of drainage from the lumbar wound.  Previously this patient,  about 10 days ago, underwent fusion by Cristi Loron, M.D.  The patient  had been seen by infectious disease plus critical care medicine.  He is  being taken to surgery for debridement and drainage of the wound.  The  patient was taken to the OR and after intubation, he was positioned in a  prone manner. The back was prepped with Betadine.  The previous incision was  opened with removal of the Vicryl stitches.  Immediately quite a large  amount of purulent material was removed.  Specimen was sent to the  laboratory for aerobic and anaerobic culture.  There was some drainage  coming only from the fascia and the fascia was opened.  More drainage was  removed.  The dural matter was intact as well as the pedicle screws.  About  three liters of saline solution was used to clean the wound.  There was an  area of necrosis especially in the adipose tissue which was removed.  At the  end, we had good drainage of the area.  Two large packings were left in the  space to continue with drainage.  The wound was left open and the patient is  going back to the intensive care unit to continue the care.                         Hilda Lias, M.D.    EB/MEDQ  D:  07/07/2004  T:  07/08/2004  Job:  [id number]   cc:   Stacie Glaze, M.D. [facility]

## 2011-04-08 NOTE — Assessment & Plan Note (Signed)
[place] [facility]                         ELECTROPHYSIOLOGY OFFICE NOTE   NAME:[name]                     MRN:          [id number]  DATE:02/14/2007                            DOB:          29-Mar-1935    [name] is seen.  He has a history of syncope in the context of  bifascicular block.  He also has atrial fibrillation, which is  permanent.  He is status post IOR implantation.   He has had progressive loss of energy over the last couple of weeks.  I  suspect this is related to his atrial fibrillation with a rapid  ventricular response.   Interrogation of his loop recorder demonstrated multiple rates in the  last few hours of over 120.   CURRENT MEDICATIONS INCLUDE:  1. Diltiazem 120.  2. Nadolol 10.  3. Benazepril.  4. Metformin.  5. Lasix.  6. Protonix.  7. Glyburide.  8. Coumadin.   EXAMINATION:  His blood pressure is 123/80, his pulse is 120.  His lungs  were clear.  Heart sounds were irregular.  Extremities were without  edema.   His loop recorder was interrogated and reprogrammed to eliminate tachy  detections, as the real issue here is brady detections.  We will plan to  increase his Nadolol from 10 to 20.  I will see him again in 2-3 weeks'  time to reassess and see if we are having evidence of bradycardia.  If  not, we will further augment his Nadolol and we may need a Holter  monitor to survey for heart rates.   I suspect we are coming to pacemaker implantation for tachy-brady in the  not-distant future.   He will also be seen down at [facility] because of the diplopia  related to his blow-out fracture.     [name], MD, [facility]  Electronically Signed    SCK/MedQ  DD: 02/14/2007  DT: 02/14/2007  Job #: [id number]   cc:   Stacie Glaze, MD

## 2011-04-08 NOTE — Discharge Summary (Signed)
NAMELEVIE, WAGES NO.:  [id number]   MEDICAL RECORD NO.:  [id number]          PATIENT TYPE:  INP   LOCATION:  3743                         FACILITY:  [facility]   PHYSICIAN:  Cristi Loron, M.D.DATE OF BIRTH:  Sep 23, 1935   DATE OF ADMISSION:  07/24/2004  DATE OF DISCHARGE:  08/03/2004                                 DISCHARGE SUMMARY   HISTORY OF PRESENT ILLNESS:  The patient is a 76 year old white male well-  known to me, whose prior history can be obtained via the previous medical  records.  Briefly, he underwent a lumbar fusion, had a subsequent infection  and incision and drainage of that with a concurrent episode of atrial  fibrillation.  The patient had a syncopal episode on the morning of  July 24, 2004, and presented to the emergency department and was noted  to be in atrial fibrillation again.   For further details of this admission, please refer to history and physical.   HOSPITAL COURSE:  I admitted the patient to [facility] with  diagnosis of syncope, atrial fibrillation, and a scalp laceration.  The  scalp laceration was minor and did not require any further intervention.  It  healed on its own.  His wound infection continued to be treated with  intravenous antibiotics and dressing changes.  His atrial fibrillation was  worked up by [name], M.D. of [facility].  He converted to a  normal sinus rhythm and he underwent further workup including echocardiogram  and they thought he was stable.  They did not think he needed any further  intervention at this point.  By August 03, 2004, the patient was back in  normal sinus rhythm, he was afebrile, and vital signs were stable.  Arrangements were made for home health care dressing changes, IV  antibiotics, etc.  He was discharged to home.  (We did have the rehab team  see him and they thought he was functioning too well to go into inpatient  rehab).   DISCHARGE  INSTRUCTIONS:  The patient was instructed to follow up with me in  a week for dressing change, Dr. Graciela Husbands for cardiac follow-up, and Stacie Glaze, M.D. [facility] for medical follow-up.   DISCHARGE DIAGNOSES:  1.  Wound infection.  2.  Syncopal episode.  3.  Atrial fibrillation, resolved.   DISCHARGE MEDICATIONS:  1.  Valium 5 mg #50 one p.o. q.8h p.r.n. for muscle spasms.  2.  Percocet 5 #50 one to two p.o. q.4h p.r.n. for pain.  3.  Protonix 40 mg #30 one p.o. daily.  4.  Visken 5 mg #30 one p.o. daily.  5.  Coumadin 2 mg #30 one p.o. daily (he is to have his PT checked at      [facility] next week).  6.  Lasix 40 mg one p.o. daily.  7.  Cardizem CD 240 mg one p.o. daily.  8.  Avapro 150 mg one p.o. b.i.d.  9.  Glucophage 500 mg #60 one p.o. b.i.d.  10. DiaBeta 2.5 mg one p.o. b.i.d. #60.  11. K-Dur 20  mEq #30 one p.o. daily.   PROCEDURE:  None.       JDJ/MEDQ  D:  08/26/2004  T:  08/27/2004  Job:  [id number]   cc:   [name], M.D.

## 2011-04-08 NOTE — Discharge Summary (Signed)
[name], Cross              ACCOUNT NO.:  [id number]   MEDICAL RECORD NO.:  [id number]          PATIENT TYPE:  INP   LOCATION:  3712                         FACILITY:  [facility]   PHYSICIAN:  Cristi Loron, M.D.DATE OF BIRTH:  [birth year]   DATE OF ADMISSION:  07/05/2004  DATE OF DISCHARGE:  07/20/2004                                 DISCHARGE SUMMARY   BRIEF HISTORY:  The patient is a 76 year old white male whom I performed a  lumbar fusion on approximately seven days prior to this admission.  He  initially did great, with wonderful resolution of his symptoms, but then  developed discharge from the wound.  Dr. Allena Katz admitted him with a wound  infection.   For further details of this admission, please refer to the typed history and  physical.   HOSPITAL COURSE:  Dr. Allena Katz performed incision and drainage of the patient's  wound.  It grew sensitive to Staph.  Dr. Allena Katz requested an infectious  disease consult.  They helped [place] with the management of this infection.  The  patient also had some confusion and was hallucinations, and Dr. Allena Katz asked  for a pulmonary critical care consult.  It was felt that he was likely going  through delirium tremens from alcohol withdrawal as a cause of his  confusion.  The wound infection was managed by Dr. Roxan Hockey and he was  initially treated with IV Rocephin and vancomycin.  The patient's wound  cultures came back with a methicillin-susceptible Staphylococcus aureus, and  the ID team changed his antibiotics to IV Ancef.   Another problem during this admission was atrial fibrillation.  We had the  cardiology team see him.  He was seen by Dr. Graciela Husbands.  He underwent some  testing.  They treated him with medications, and his atrial fibrillation  cleared.  Because of his atrial fibrillation, the patient was started on  Coumadin at the recommendation of the cardiologist.   By 07/20/04, the patient had converted back to a normal sinus rhythm.  He  was  afebrile.  His vital signs were stable.  His wound was healing, granulating  well.  We made arrangements for home IV antibiotics and home health care  dressing changes.  At this point, the patient felt comfortable with  discharge home, and he was discharged home on 07/20/04.   DISCHARGE PRESCRIPTIONS:  1.  Percocet 5 (#100) one to two p.o. q.4h. p.r.n. for pain.  2.  Valium 5 mg (#50) one p.o. q.6h. p.r.n. for muscle spasms.  3.  Coumadin 2 mg p.o. daily (#30).   DISCHARGE INSTRUCTIONS:  The patient was instructed to follow up with me in  two weeks for a wound check.  He was instructed to follow up with Dr. Graciela Husbands  as per his instructions and follow up with Dr. Roxan Hockey of ID per his  instructions.  He was instructed to resume his outpatient medical regimen.   FINAL DIAGNOSES:  1.  Wound infection.  2.  Atrial fibrillation.  3.  Delirium tremens.   PROCEDURE PERFORMED:  1.  Incision and drainage of wound.  2.  Placement of PICC line.       JDJ/MEDQ  D:  08/19/2004  T:  08/20/2004  Job:  [id number]

## 2011-04-08 NOTE — Discharge Summary (Signed)
NAMESUTTON, [name]              ACCOUNT NO.:  [id number]   MEDICAL RECORD NO.:  [id number]          PATIENT TYPE:  INP   LOCATION:  2018                         FACILITY:  [facility]   PHYSICIAN:  Bruce Rexene Edison. Swords, MD    DATE OF BIRTH:  1935-08-03   DATE OF ADMISSION:  12/17/2006  DATE OF DISCHARGE:  12/21/2006                               DISCHARGE SUMMARY   DISCHARGE DIAGNOSES:  1. Syncope.  2. Right orbital blow out fracture with orbital Core-Flex fracture.  3. Right lower lip laceration.  4. Atrial fibrillation.  5. History of congestive heart failure.  6. Right bundle branch block.  7. Previous syncope in 2005.  8. Diabetes.  9. Hypertension.  10.Gastroesophageal Reflux Disease.  11.History of right rotator cuff tear.  12.History of right rectus muscle tear.  13.History L4/L5 fusion.   CONSULTATIONS:  Dr. Suszanne Conners for evaluation of orbital fracture and repair  of transverse right lower eyelid. Cardiology for evaluation of syncope  and implantable loop recorder performed on 12/21/06 (pending).   DISCHARGE MEDICATIONS:  1. Coumadin as directed.  2. Diltiazem CD 120 mg p.o. q. day.  3. Glyburide 2.5 mg b.i.d.  4. Protonix 45 g p.o. q. day.  5. Lasix 40 mg p.o. q. day.  6. Beta 20 mEq p.o. b.i.d.  7. Lisinopril 20 mg p.o. q. day.  8. Metformin 500 mg p.o. b.i.d.  9. Condition on discharge improved.  The patient ambulating without      difficulty.   FOLLOW-UP PLAN:  1. He is to follow-up with Dr. Suszanne Conners as instructed by Dr. Suszanne Conners.  2. He is to follow-up with Dr. Graciela Husbands as per cardiology instructions.  3. Follow-up with Dr. Lovell Sheehan early next week.  4. Follow-up Coumadin early next week.   HOSPITAL PROCEDURES:  Repair right eyelid laceration, see Dr. Avel Sensor  note.  CT of the head demonstrated right orbital blow out fracture with a  opacification of right maxillary sinus.  CT of the brain, there is  atrophy and mild small vessel disease.  No intracranial abnormality.   Loop  recorder (pending).   HOSPITAL COURSE:  1. The patient admitted to the hospital service on 12/17/06 after      suffering a syncopal episode, see Dr. Raphael Gibney admission notes for      details.  2. Syncope.  The patient evaluated by cardiology, implant a loop      recorder.  The patient remained in atrial fibrillation during his      hospitalization.  Rate seems to be controlled at the time of      discharge.  3. Blow out fracture.  The patient seen by Dr. Suszanne Conners, 4 cm laceration      was repaired.  I have asked the patient to follow-up with Dr. Suszanne Conners      as per Dr. Avel Sensor instructions.  I have asked the nurses to      determine when follow-up is needed with Dr. Suszanne Conners.   Other medical problems are stable as listed above and maintain his home  medications except does take his Cardizem once a day instead of  twice a  day.  His nadolol was discontinued while in the hospital and that will  be discontinued going forward.   DISCHARGE INSTRUCTIONS:  The patient should not drive.   The patient was ruled out for myocardial infarction by serial cardiac  enzymes.   HOSPITAL LABORATORIES:  Cholesterol 159, HDL 40, LDL 94.  TSH normal at  1.827.      Bruce Rexene Edison Swords, MD  Electronically Signed     BHS/MEDQ  D:  12/21/2006  T:  12/21/2006  Job:  [id number]   cc:   Stacie Glaze, MD

## 2011-04-08 NOTE — Op Note (Signed)
NAMENAVJOT, LOERA              ACCOUNT NO.:  [id number]   MEDICAL RECORD NO.:  [id number]          PATIENT TYPE:  INP   LOCATION:  2018                         FACILITY:  [facility]   PHYSICIAN:  Newman Pies, MD            DATE OF BIRTH:  Jan 31, 1935   DATE OF PROCEDURE:  12/18/2006  DATE OF DISCHARGE:                               OPERATIVE REPORT   SURGEON:  Newman Pies, M.D.   PREOPERATIVE DIAGNOSES:  1. Right lower lid laceration.  2. Right orbital blowout fracture with orbital floor fracture.   POSTOPERATIVE DIAGNOSES:  1. Right lower lid laceration.  2. Right orbital blowout fracture with orbital floor fracture.   PROCEDURE PERFORMED:  Repair of right lower eyelid laceration.   ANESTHESIA:  Local anesthesia with 2% lidocaine.   COMPLICATIONS:  None.   ESTIMATED BLOOD LOSS:  Minimal.   INDICATIONS FOR PROCEDURE:  Mr. Leelynn Whetsel is a 76 year old white  male with an episode of syncope and loss of consciousness on December 17, 2006.  This resulted in a fall with facial trauma.  The CT scan showed  right inferior orbital blowout fracture.  In addition, he also has a 4  cm laceration of the right lower eyelid.  Based on that finding, the  decision was made for the patient to undergo surgical closure of the  eyelid laceration.  The risks, benefits, alternatives, and details of  the procedure were discussed with the patient.   DESCRIPTION OF PROCEDURE:  The patient was laid supine in his hospital  bed.  The patient was prepped and draped in a standard fashion for  laceration repair, and 2% lidocaine was injected around the laceration  site.  The soft tissues on each side of the laceration were then  approximated and closed with interrupted 5-0 Prolene sutures.  The  patient tolerated the procedure well without difficulty.   SPECIMEN REMOVED:  None.   OPERATIVE FINDINGS:  A 4 cm transverse right lower eyelid laceration.  The laceration was closed using interrupted 5-0 Prolene  sutures under  local anesthesia.      Newman Pies, MD  Electronically Signed     ST/MEDQ  D:  12/18/2006  T:  12/19/2006  Job:  [id number]

## 2011-04-08 NOTE — Assessment & Plan Note (Signed)
[facility]                                 ON-CALL NOTE   NAME:[name], [name]                       MRN:          [id number]  DATE:02/27/2007                            DOB:          [birth year]    HISTORY:  [name] is a patient followed by Dr. Graciela Husbands who has a loop  recorder in and was recently seen by Dr. Graciela Husbands in followup.  Apparently  he had some rapid atrial fibrillation, and his medications were  adjusted.  According to the notes, he was on Nadolol 10 mg a day; and he  was increased to 20 mg a day; however, the patient notes that he was on  20 mg a day when he saw Dr. Graciela Husbands and was told to double up the  medication.  Therefore, since that time, he has been taking 40 mg a day.  He ran out of his prescription and tried to refill it at the pharmacy,  but they would not, because his old prescription had not been changed.  He called, today, to get a new prescription.  Apparently he has been in  touch with the office; and there is some confusion as to what he is to  be taking.   PLAN:  I called in a renewal for his prescription of Nadolol 20 mg two  tablets daily.  He received #60 with no refills.  I have left a message  at the office for someone to contact him tomorrow to straighten out what  medicine he is supposed to be on, and at what dosage.  He will continue  as he has been doing, up until this point, until he talks to someone in  the office tomorrow.   DISPOSITION:  As noted above.      Tereso Newcomer, PA-C  Electronically Signed      [name] Salvia, MD, [facility]  Electronically Signed   SW/MedQ  DD: 02/27/2007  DT: 02/28/2007  Job #: [id number]

## 2011-04-08 NOTE — H&P (Signed)
NAME:  [name], [name]                        ACCOUNT NO.:  [id number]   MEDICAL RECORD NO.:  [id number]                   PATIENT TYPE:  INP   LOCATION:  1824                                 FACILITY:  [facility]   PHYSICIAN:  Cristi Loron, M.D.            DATE OF BIRTH:  [birth year]   DATE OF ADMISSION:  07/24/2004  DATE OF DISCHARGE:                                HISTORY & PHYSICAL   CHIEF COMPLAINT:  Blacked out.   HISTORY OF PRESENT ILLNESS:  The patient is a 76 year old, white male who  suffered from lumbar spondylolisthesis and significant spinal stenosis with  a neurogenic claudication.  He decided to proceed with surgery and I  performed an L4-L5 fusion on him with instrumentation on June 24, 2004.  The surgery went well and he was discharged a few days later.  Unfortunately, the patient was readmitted on July 05, 2004, with a wound  infection.  Dr. Jeral Fruit took him back to the OR and did incision and drainage  of the wound and left the wound open.  It grew a sensitive Staphylococcus.  We had ID see the patient and he was seen by Dr. Rockey Situ. Roxan Hockey.   The patient, upon readmission on July 05, 2004, was noted to be in atrial  fibrillation.  Dr. Graciela Husbands saw the patient and was treated with p.o. Cardizem.  He eventually converted to a normal sinus rhythm and was in this rhythm for  several days.  The patient was subsequently discharged on July 20, 2004,  after he had been in sinus rhythm for several days.  He was afebrile and his  wound discharge was decreasing.  His arrangements were made for home health  care and IV antibiotics.  He was discharged on Ancef.   The patient's wife noted that the patient was having a lot of lower  extremity edema.  He was seen at [facility] yesterday and they did an  ultrasound which showed DVT.   The patient, this morning, blacked out suffering a syncopal event striking  the back of his head with a laceration.  There was a lot of  bleeding.  The  patient's wife called EMS and he was brought to the emergency department.   Presently, the patient denies headache or neck pain.  He admits to a little  of shortness of breath, but does not have any chest pain.   PAST MEDICAL HISTORY:  1.  L4-L5 spondylolisthesis with stenosis.  2.  Diabetes mellitus diagnosed about 10 years ago.  3.  Rotator cuff injury.  4.  Rupture of his rectus muscle.   PAST SURGICAL HISTORY:  1.  Rotator cuff repair.  2.  Repair of rectus muscle.  3.  Lumbar L4-L5 instrument fusion with subsequent ID of an infected wound      as above.   FAMILY HISTORY:  Mother died at age 48 of old age.  Father died at age  74  from cardiac arrest.   SOCIAL HISTORY:  The patient is married.  He has two children.  He lives in  [place].  He is employed in [employer].  He denies tobacco and drug use.  He  has had a history of heavy alcohol abuse in the past, but his wife states  that he has not been drinking since his last discharge.   REVIEW OF SYSTEMS:  Negative except as above.   PHYSICAL EXAMINATION:  GENERAL:  A pleasant, 76 year old, white male in no  apparent distress.  HEENT:  The patient has a laceration in his high occipital scalp and  currently not bleeding.  It looks relatively minor.  Pupils equal round and  reactive to light.  Extraocular movements intact.  NECK:  Supple.  No deformities.  Good range of motion with no tenderness.  HEART:  Irregular tachycardic rhythm ranging from 130-150.  ABDOMEN:  Soft.  EXTREMITIES:  The patient does have some moderate bilateral lower extremity.  BACK:  He continues to have purulent discharge from his back.   LABORATORY DATA AND X-RAY FINDINGS:  I reviewed the patient's cranial CT  scan without contrast demonstrating a scalp laceration, but is intracranial  normal, i.e. there is no subdural hematoma, contusion, etc.   ASSESSMENT/PLAN:  1.  Atrial fibrillation.  The patient is back in atrial fibrillation.   He      did have some periods of bradycardia and I suspect this is why he passed      out/had a syncopal event.  He is currently on Coumadin and his last      protime was therapeutic.  I am going to ask Dr. Graciela Husbands to see the patient      to evaluate him.  He may need a pacemaker, but of course, that is up to      them.  2.  Wound infection.  Continue with wet-to-dry normal saline dressing      changes q.8h. and as needed, intravenous Ancef.  3.  Scalp laceration.  It does not look like it needs to be sutured at this      point as it is superficial.  4.  Diabetes mellitus.  Will keep him on his oral agents and a sliding scale      insulin.                                                Cristi Loron, M.D.    JDJ/MEDQ  D:  07/24/2004  T:  07/24/2004  Job:  [id number]

## 2011-04-08 NOTE — Discharge Summary (Signed)
NAME:  [name], [name]                        ACCOUNT NO.:  [id number]   MEDICAL RECORD NO.:  [id number]                   PATIENT TYPE:  INP   LOCATION:  2023                                 FACILITY:  [facility]   PHYSICIAN:  Rene Paci, M.D. [facility]          DATE OF BIRTH:  02-25-1935   DATE OF ADMISSION:  03/30/2004  DATE OF DISCHARGE:  03/31/2004                                 DISCHARGE SUMMARY   DISCHARGE DIAGNOSES:  1. Atypical chest pain.  2. Myocardial infarction ruled out.  3. Gastroesophageal reflux disease.  4. Uncontrolled diabetes.  5. Mild hyponatremia.   BRIEF ADMISSION HISTORY:  Mr. Mortensen is 76 year old white male who was at  his orthopedic's office today for followup of his low back pain.  While he  was sitting, he developed onset of right shoulder blade pain that radiated  to his left shoulder, axilla, and back.  This was associated with shortness  of breath and diaphoresis.  This lasted approximately 10 minutes.  When he  did see his orthopedist, he told him about his pain.  Dr. Ethelene Hal sent him to  the emergency department for further evaluation.  He had no relief in his  symptoms after nitroglycerin.  He attributes this to increased reflux and  indigestion.   PAST MEDICAL HISTORY:  1. Adult-onset diabetes mellitus.  2. Hypertension.  3. Spinal stenosis at L4-5 by MRI in April 2005 with anterior listhesis at     multiple levels, status post epidural steroid injections a few weeks     prior to this admission.   HOSPITAL COURSE:  #1.  CARDIOVASCULAR:  The patient presented with atypical chest pain.  The  patient has multiple cardiac risk factors and was admitted to rule out  myocardial infarction.  Serial cardiac enzymes were negative.  EKG was  without any evidence of acute ischemia.  The patient did rule out for  myocardial infarction, and he is scheduled for an outpatient adenosine  Cardiolite.   #2.  GASTROESOPHAGEAL REFLUX DISEASE:  The patient  describes accelerated  reflux and indigestion symptoms.  Of note, the patient is on diclofenac  daily.  The patient has been started on a proton pump inhibitor and will  have him continue as an outpatient.   #3.  ADULT-ONSET DIABETES MELLITUS:  The patient's diabetes is uncontrolled.  His hemoglobin A1C was elevated at 7.9%.  He does not check his blood sugars  at home, and he also is not currently following a diabetic diet.  We will  have the patient increase Glucovance.  We will also instruct the patient on  checking his blood sugars and given him a prescription to obtain a home  Glucometer.   #4.  MILD HYPONATREMIA:  The patient's sodium was 128 on admission. This  improved to 131 with IV fluids.  We suspect this is secondary to  hydrochlorothiazide and Lotensin HCT.   DISCHARGE LABORATORY AND X-RAY DATA:  CBC  was normal.  Sodium 131, BUN 27,  creatinine 1.2.  TSH 1.204.  Hemoglobin A1C 7.9%.   DISCHARGE MEDICATIONS:  1. Glucovance 2.5/500 b.i.d.  2. Lexapro 10 mg daily.  3. Lotensin/HCT 20/25.  4. Diclofenac ER 100 mg daily.  5. Hydrocodone 10/650 p.r.n.  6. Protonix 20 mg daily.   FOLLOW UP:  Dr. Lovell Sheehan on Wednesday, May 18, at 10:15 a.m. to review stress  test and diabetes.  Adenosine Cardiolite scheduled for today, Mar 31, 2004,  at 11:30 a.m.      Cornell Barman, P.A. [facility]                  Rene Paci, M.D. [facility]    LC/MEDQ  D:  03/31/2004  T:  03/31/2004  Job:  [id number]   cc:   Stacie Glaze, M.D. [facility]

## 2011-04-08 NOTE — Op Note (Signed)
[name], [name]              ACCOUNT NO.:  [id number]   MEDICAL RECORD NO.:  [id number]          PATIENT TYPE:  AMB   LOCATION:  SDS                          FACILITY:  [facility]   PHYSICIAN:  Newman Pies, MD            DATE OF BIRTH:  [birth year]   DATE OF PROCEDURE:  01/03/2007  DATE OF DISCHARGE:                               OPERATIVE REPORT   STAFF SURGEON:  Su Philomena Doheny, MD   PREOPERATIVE DIAGNOSES:  1. Right orbital floor blowout fracture.  2. Diplopia.   POSTOPERATIVE DIAGNOSES:  1. Right orbital floor blowout fracture.  2. Diplopia.   PROCEDURE:  Repair of right orbital floor fracture with 1.5 mm inion CPS  baby biodegradable plate.   ANESTHESIA:  General endotracheal tube anesthesia.   COMPLICATIONS:  None.   ESTIMATED BLOOD LOSS:  10 mL.   INDICATIONS FOR PROCEDURE:  [name] is a 76 year old white  male white male who sustained facial trauma during a syncope episode  December 17, 2006.  On CT scan, he was noted to have a comminuted blowout  fracture of the right orbital floor.  After observation for  approximately two weeks, he continues to have diplopia secondary to the  orbital floor fracture.  His right pupil is noted to be higher than the  left pupil.  Based on that finding, the decision was made for the  patient to undergo operative repair of the right orbital floor blowout  fracture.  The risks, benefits, alternatives, and details of the  procedure were discussed with the patient.  He wished to proceed with  the above-stated procedure.  All questions were answered and informed  consent was obtained.   DESCRIPTION OF PROCEDURE:  The patient was taken to the operating room  and placed supine on the operating table.  General endotracheal tube  anesthesia was administered by the anesthesiologist.  Preop IV  antibiotic was given.   The patient was then positioned and prepped and draped in the standard  fashion for right orbital floor fracture  repair.  1% lidocaine with  1:100,000 epinephrine were injected around the right lateral canthus.  A  standard right lateral canthotomy and inferior cantholysis was  performed.  A transconjunctival incision was then made on the right side  continuous on the right inferior lid.  Careful blunt dissection was then  carried out through the orbital fat onto the anterior rim of the orbital  floor.  The periosteum of the orbital floor was carefully elevated.  A  large blowout fracture of the right orbital floor was noted.  No  significant entrapment of the soft tissue within the comminuted fracture  site.  The infraorbital nerve was noted through the orbital floor  fracture.  The nerve was preserved.  After the soft tissue was reduced  into the orbital content the size of the orbital floor fracture was  measured.  A measured approximately 3 cm in diameter.  The biodegradable  1.5 mm inion plate was then sized and shaped to conform to the fracture  site.  It was placed  onto the orbital floor with approximately 3 mm of  margin extent beyond the fracture site.  A small tab was made at the  anterior edge of the mini plate.  The tab was used to insert into the  maxillary sinus to position and hold the plate in place.  Transconjunctival incision was then closed using six-hole plain gut  sutures in interrupted fashion.  The tenotomy tendon was then  reapproximated using the 3-0 Vicryl sutures.  The skin incision was then  closed using 5-0 Prolene sutures.  The patient tolerated entire  procedure well without complications.  The care of the patient was  turned over to the anesthesiologist.  The patient was awakened from  anesthesia without difficulty.  He was extubated and transferred to the  recovery room in good condition.   OPERATIVE FINDINGS:  A large 3 cm orbital floor fracture with entrapment  of orbital soft tissue.  A 1.5 mm inion biodegradable plate was used to  repair the orbital floor  fracture.   SPECIMENS REMOVED:  None.   FOLLOW UP CARE:  The patient will be observed in the postanesthetic care  unit.  Once he is wake alert, tolerating p.o., and with stable vitals,  he will be discharged home.  He will follow-up in my office in  approximately 1 week.      Newman Pies, MD  Electronically Signed     ST/MEDQ  D:  01/03/2007  T:  01/03/2007  Job:  [id number]

## 2011-04-08 NOTE — H&P (Signed)
[name], DREES NO.:  [id number]   MEDICAL RECORD NO.:  [id number]          PATIENT TYPE:  INP   LOCATION:  2018                         FACILITY:  [facility]   PHYSICIAN:  Corwin Levins, MD      DATE OF BIRTH:  May 31, 1935   DATE OF ADMISSION:  12/17/2006  DATE OF DISCHARGE:                              HISTORY & PHYSICAL   CHIEF COMPLAINT:  Syncope today, noticed to be in rapid atrial  fibrillation in the ER.   HISTORY OF PRESENT ILLNESS:  [name] is a 76 year old white male  here with the above.  He apparently had just gotten up from the chair,  suffered some very brief weakness and sudden loss of consciousness and  fell to his face, not able to stop himself.  He came to the ER with a  bloody face.  CT shows nondisplaced right orbital fracture and opacified  right maxillary sinus as he is on Coumadin.  He has also had a  productive cough with pink eye symptoms for the past 4-5 days but denies  the cough associated with the syncope today.  The family has also  noticed warm left lower extremity below the knee with some swelling, but  he thinks essentially this has been no change for 1 year.  He was given  IV Cardizem in the ER and now feels better.  He is just back from  getting his chest x-ray at the moment, which is pending.   PAST MEDICAL HISTORY:  Illnesses:  1. Atrial fibrillation.  2. Chronic right bundle branch block.  3. History of CHF per computer record.  4. October of 2005, syncopal episode.  5. May of 2005, Cardiolite negative.  6. Diabetes mellitus.  7. Hypertension.  8. GERD.  9. History of delirium and questionable Dts; he says that he had one      drink last yesterday.  10.History of right rotator cuff tear.  11.History of ruptured right rectus muscle.  12.He is status post L4-5 fusion with wound infection and __________      secondary to spinal stenosis and neurogenic claudication.   ALLERGIES:  NO KNOWN DRUG ALLERGIES.   CURRENT  MEDICATIONS:  1. Oxycodone 5 mg p.r.n.  2. Coumadin 5 mg as directed.  3. Cardizem LA b.i.d. but unknown dose.  4. Glyburide 2.5 mg b.i.d.  5. Protonix 40 mg p.o. daily.  6. K-Dur 20 mEq p.o. daily.  7. Lasix 40 mg p.o. daily.  8. Lisinopril 20 mg p.o. daily.  9. Metformin 500 mg p.o. b.i.d.  10.Nadolol 20 mg p.o. daily.  11.Diazepam 5 mg p.r.n.  12.Zolpidem q.h.s. p.r.n.   SOCIAL HISTORY:  Lives with his wife; no tobacco.  Some alcohol recently  but heavy in the past.  Two children, retired Medical illustrator.   FAMILY HISTORY:  Otherwise noncontributory.   REVIEW OF SYSTEMS:  Otherwise noncontributory.   PHYSICAL EXAMINATION:  VITAL SIGNS: He is afebrile.  Blood pressure  110/65.  After Cardizem in the ER, heart rate 134, down from the 160s.  Respirations 22, O2 saturation 95%.  ENT EXAM:  Left sclera with conjunctival erythema consistent with  infectious etiology.  There is marked right periorbital bruising and  swelling and hematoma consistent with trauma, on Coumadin.  Otherwise  normocephalic, atraumatic.  Mouth:  Negative.  No bitten tongue.  NECK:  Without lymphadenopathy, JVD, or thyromegaly.  CHEST:  Decreased breath sounds at the bases.  CARDIAC:  Irregularly irregular and tachy.  ABDOMEN:  Soft, nontender, positive bowel sounds.  EXTREMITIES:  Essentially negative except for the left lower extremity  below the knee which is moderately warm, mild erythema, and 1+ swelling  but essentially nontender.  He thinks, again, this is chronic.   LABORATORY:  UA negative except positive glucose.  CPK-MB 1.3, troponin  I of less than 0.05.  Electrolytes:  Sodium 132, BUN 6, creatinine 0.8,  glucose 356.  Hemoglobin 14.3, INR of 1.7 on Coumadin.  Also of note is  that computer records indicate hemoglobin A1c 11.3 in December 2007.  ECG:  Atrial fibrillation, heart rate 153.  Head CT, maxillofacial CT  negative except for right orbital blow-out fracture, nondisplaced;  otherwise no  acute problem.  Chest x-ray just done and pending.   ASSESSMENT AND PLAN:  1. Syncope.  He is to be admitted.  Rule out myocardial infarction      with serial cardiac enzymes.  Likely needs cardiology consult.  2. Atrial fibrillation with rapid ventricular response; given 25 mg      Cardizem IV push in the ER, now on Cardizem drip, which he seems to      be tolerating well.  Will check TSH as well as 2D echocardiogram.  3. Chronic Coumadin __________ manage.  4. Hyponatremia.  Mild chronic __________  to follow.  5. Right orbital fracture.  Consider ENT evaluation.  Give Dilaudid      p.r.n.  6. Diabetes mellitus, severe, uncontrolled as an outpatient.  I wonder      about compliance.  He will start home medications, apply sliding      scale insulin, may need basal insulin added.  7. For other medical problems, he will continue home medications.  8. Prophylaxis.  Continue with proton pump inhibitors and Coumadin as      is.  9. CODE status.  FULL CODE for now.  10.Upper respiratory infection/bronchitis, questionable pneumonia.      Cover with IV azithromycin/Rocephin and followup chest x-ray.      Corwin Levins, MD  Electronically Signed     JWJ/MEDQ  D:  12/17/2006  T:  12/18/2006  Job:  [id number]   cc:   Stacie Glaze, MD  [name], MD, [facility]

## 2011-04-08 NOTE — Assessment & Plan Note (Signed)
[facility]                         ELECTROPHYSIOLOGY OFFICE NOTE   NAME:Cross, [name] VANWYK                     MRN:          [id number]  DATE:03/19/2007                            DOB:          1935-04-01    [name] comes in. He has had syncope. His loop recorder was  implanted in January, he has had no recurrent problems.   He has had significant problems with sleeping. After I saw him last  time, we had increased his nadolol from 10 to 40 because of tachycardia.  He feels like he is falling asleep all the time.   PHYSICAL EXAMINATION:  VITAL SIGNS:  His blood pressure is 110/70, pulse  is 85.  LUNGS:  Clear.  HEART:  Sounds were irregular.   Will plan to discontinue nadolol. I have given him prescriptions for  Inderal LA 60 and atenolol 50. Will add Digitek to his Cardizem already  and then will plan to see him again in 4 weeks' time to reevaluate a)  his sleepiness and b) his tachycardia. I should note that his loop  recorder is turned off for tachycardia and I am not quite sure why this  is, but we may need to get a Holter monitor in 4 weeks when he returns  to get assessment of tachy arrhythmias.     [name], MD, [facility]  Electronically Signed    SCK/MedQ  DD: 03/19/2007  DT: 03/19/2007  Job #: [id number]   cc:   Stacie Glaze, MD

## 2011-04-08 NOTE — Op Note (Signed)
NAMEABHISHEK, [name]              ACCOUNT NO.:  [id number]   MEDICAL RECORD NO.:  [id number]          PATIENT TYPE:  INP   LOCATION:  2018                         FACILITY:  [facility]   PHYSICIAN:  Doylene Canning. Ladona Ridgel, MD    DATE OF BIRTH:  02/08/1935   DATE OF PROCEDURE:  12/21/2006  DATE OF DISCHARGE:                               OPERATIVE REPORT   PROCEDURE PERFORMED:  Insertion of an implantable loop recorder.   INDICATIONS:  Unexplained syncope.   1. __________ .  The patient is a 76 year old man admitted to the      hospital with facial trauma after an unexplained syncopal episode.      He has had no explanation for syncope on cardiac monitoring.  He is      now referred for insertion of an implantable loop recorder.  The      patient had his last episode of syncope over a year ago.  He has      right bundle branch block on his EKG.   1. Procedure.  After informed consent was obtained, the patient was      taken to the diagnostic imaging lab in the fasting state.  After      the usual preparation, he was given 30 mL of lidocaine over the      left pectoral region.  A 3 cm incision was carried out over this      region approximately 3 cm below the clavicle, 3 cm lateral to the      midline.  In this location, electrocautery was utilized to dissect      down to the fascial plane.  A small subcutaneous pocket was then      made with electrocautery.  Electrocautery was utilized to assure      hemostasis.  The Medtronic Reveal Plus Model implantable loop      recorder, serial number [id number] was placed in the subcutaneous      pocket, secured with a silk suture.  The incision was closed with a      layer of 2-0 Vicryl followed by a layer of 4-0 Vicryl.  Benzoin was      painted on the skin and Steri-Strips were applied and a pressure      dressing was placed, and the patient was returned to his room in      satisfactory condition.   COMPLICATIONS:  There were no immediate procedure  complications.  Therefore, results demonstrate successful implantation of a Medtronic  implantable loop recorder in a patient with unexplained syncope.      Doylene Canning. Ladona Ridgel, MD  Electronically Signed     GWT/MEDQ  D:  12/21/2006  T:  12/21/2006  Job:  [id number]

## 2011-04-08 NOTE — Consult Note (Signed)
NAMEEDIN, KON              ACCOUNT NO.:  [id number]   MEDICAL RECORD NO.:  [id number]          PATIENT TYPE:  INP   LOCATION:  2018                         FACILITY:  [facility]   PHYSICIAN:  Veverly Fells. Excell Seltzer, MD  DATE OF BIRTH:  December 29, 1934   DATE OF CONSULTATION:  12/19/2006  DATE OF DISCHARGE:                                 CONSULTATION   REASON FOR CONSULTATION:  Syncope in the setting of atrial fibrillation  with rapid ventricular response.   HISTORY OF PRESENT ILLNESS:  Mr. Eddings is a very nice 76 year old  Caucasian gentleman who was hospitalized yesterday with syncope that  occurred in the setting of micturition.  The patient states that he was  standing up and urinating at the toilet when he suddenly lost  consciousness.  He had no prodrome.  His first recollection of any event  was his wife standing over him while he was on the floor.  He sustained  a right orbital fracture at the time of his fall and apparently was  found to be in atrial fibrillation with rapid ventricular response upon  the arrival of EMS.  Of note, Mr. Leeth has a history of chronic  atrial fibrillation and says that at his recent office visits with Dr.  Lovell Sheehan, he has been in atrial fibrillation and he is treated with rate  control and chronic anticoagulation.  He denies any recent episodes of  chest pain, dyspnea, or light-headedness.  He has not had syncope since  a similar syncopal event back in 2005 when he also sustained an injury.  He has not had any recent illnesses.  This morning he complains of  feeling fatigued with activity.  He has no other acute complaints at  this time.  He specifically denies orthopnea, PND, edema, or  palpitations.   CURRENT MEDICATIONS:  Include:  1. Zithromax 250 mg IV daily.  2. Rocephin 1 g IV daily.  3. Lasix 40 mg daily.  4. Glyburide 2.5 mg twice daily.  5. NovoLog sliding scale insulin.  6. Lantus 20 units at bedtime.  7. Lisinopril 20 mg daily.  8. Glucophage 500 mg twice daily.  9. Corgard 20 mg daily.  10.Protonix 40 mg daily.  11.K-Dur 20 mEq daily.  12.Coumadin.   ALLERGIES:  NKDA.   PAST MEDICAL HISTORY:  Pertinent for the following:  1. Atrial fibrillation.  The patient has been maintained at home on      nadolol as well as Cardizem.  Apparently his heart rate has been      under reasonable control.  2. Syncope in the setting of rapid atrial fibrillation back in 2005.  3. Essential hypertension.  4. Type 2 diabetes.  5. Chronic right bundle branch block.  6. Gastroesophageal reflux disease.  7. History of alcohol abuse.  8. Spinal stenosis, status post lumbar surgery.  9. Right rotator cuff tear.  10.History of ruptured right rectus muscle.  11.History of hyponatremia.   SOCIAL HISTORY:  The patient lives in [place] with his wife.  He has  2 children.  He is a retired Oceanographer.  He is a  former heavy smoker,  but now drinks only occasionally.  He quit smoking many years ago.   FAMILY HISTORY:  His mother died of old age and pneumonia at age 71.  Father died at age 71 of a myocardial infarction.  He has 1 brother who  is alive and well.   REVIEW OF SYSTEMS:  A complete 12-point review of systems was performed.  Pertinent positives included a productive cough for the past 4-5 days,  also generalized weakness and pain in the right eye.  All other systems  were reviewed and were negative, except as described above.   EXAMINATION:  GENERAL:  The patient is alert and oriented.  He is in no  acute distress.  VITALS:  Temperature is 98.8, heart rate is 85 and irregular,  respiratory rate is 20.  Blood pressure is 113/68.  His weight is 106  kg.  Oxygen saturation 96% on room air.  HEENT:  There is right periorbital ecchymosis present.  Pupils are  equal, round, and reactive to light.  Extraocular movements intact.  The  sclerae are injected.  Oropharynx is clear.  NECK:  Normal carotid upstrokes without bruits.   Jugular venous pressure  is normal.  There is no thyromegaly or thyroid nodules.  There is no  lymphadenopathy.  LUNGS:  Clear to auscultation bilaterally.  CARDIOVASCULAR:  Irregularly irregular without murmurs or gallops.  Pulses are 2+ and equal throughout.  ABDOMEN:  Soft, nontender, no organomegaly.  No abdominal bruits.  EXTREMITIES:  There is no clubbing, cyanosis, or edema.  There is  ecchymosis in the left forearm region.  SKIN:  There is a stasis dermatitis involving the lower legs  bilaterally.  MUSCULOSKELETAL:  There are no joint deformities or effusions.  There is  no spine or CVA tenderness.  NEUROLOGICAL:  Alert and oriented to all spheres.  Cranial nerves II-XII  are grossly intact.  Strength is 5/5 and equal in the arms and legs  bilaterally.   Chest x-ray from December 17, 2006, demonstrated cardiomegaly with no  edema.  Head CT showed atrophy with mild small vessel disease and no  acute process.   EKG demonstrates atrial fibrillation with rapid ventricular response and  a right bundle branch block pattern.   LABORATORY DATA:  Shows creatinine of 1.3, potassium 5.2.  H&H is 13.7  and 39.1.  Platelet count is 161,000.  Troponin is 0.05.  CK/MB is  101/2.1.  INR was 1.6.  TSH 1.83.   ASSESSMENT:  This is a 76 year old male with chronic atrial  fibrillation, presenting with syncope.  The patient had sudden syncope  with no prodrome.  Certainly is concerning for an arrhythmic event.  Differential diagnosis would include micturition, syncope versus heart  block, versus ventricular dysrhythmia.  An echocardiogram has been  performed and shows a left ventricular ejection fraction of 50%.  I  think ventricular dysrhythmia is unlikely.  It is possible that the  patient has a tachy/brady phenomenon with his atrial fibrillation,  although we have not seen any significant bradyarrhythmias with  telemetry.  RECOMMENDATIONS:  Would recommend continuing telemetry.  We will  add  oral diltiazem for better rate control.  If his rate is well controlled  on the combination of diltiazem and Corgard, would continue these  medications and plan on an outpatient Myoview, as well as an event  recorder.  If he has significant bradyarrhythmias or any other  significant arrhythmias while monitoring him in the hospital over the  next  24-48 hours, then we will need to obtain an electrophysiology  consult and assessment.   For his other problems of hypertension, type 2 diabetes, and other  chronic medical problems, continue care as per your management.   Thank you for the opportunity to see Mr. Lukasik.  Please call at any  time with questions regarding his care.      Veverly Fells. Excell Seltzer, MD  Electronically Signed     MDC/MEDQ  D:  12/19/2006  T:  12/19/2006  Job:  [id number]   cc:   Stacie Glaze, MD  [name], MD, [facility]

## 2011-04-08 NOTE — Op Note (Signed)
NAME:  [name], [name]                        ACCOUNT NO.:  [id number]   MEDICAL RECORD NO.:  [id number]                   PATIENT TYPE:  INP   LOCATION:  3039                                 FACILITY:  [facility]   PHYSICIAN:  Cristi Loron, M.D.            DATE OF BIRTH:  1935-08-12   DATE OF PROCEDURE:  06/24/2004  DATE OF DISCHARGE:                                 OPERATIVE REPORT   PREOPERATIVE DIAGNOSES:  L4-5 grade 1 acquired spondylolisthesis, L4-5  degenerative disk disease, spinal stenosis, lumbago, lumbar radiculopathy.   POSTOPERATIVE DIAGNOSES:  L4-5 grade 1 acquired spondylolisthesis, L4-5  degenerative disk disease, spinal stenosis, lumbago, lumbar radiculopathy.   PROCEDURES:  1. L4 Gill procedure.  2. L4-5 posterior lumbar interbody fusion.  3. Insertion of bilateral L4-5 interbody prostheses (Capstone Peek cages).  4. Posterior nonsegmental instrumentation with Legacy titanium pedicle     screws and rods.  5. Posterolateral arthrodesis with local morcellized autograft bone and     Vitoss bone scaffolding.  6. Right L3 laminotomy and foraminotomy to treat the spinal stenosis at L3-4     on the right.   SURGEON:  Cristi Loron, M.D.   ASSISTANT:  Hilda Lias, M.D.   ANESTHESIA:  General endotracheal.   ESTIMATED BLOOD LOSS:  300 mL.   SPECIMENS:  None.   DRAINS:  None.   COMPLICATIONS:  None.   BRIEF HISTORY:  The patient is a 76 year old white male who has suffered  from severe back and right leg pain.  He has failed medical management and  was worked up with a lumbar MRI and lumbar spine x-rays, which demonstrated  the patient to have an L4-5 spondylolisthesis as well as severe spinal  stenosis and degenerative changes at that level.  I discussed the various  treatment options with the patient and his wife, including surgery.  The  patient has weighed the risks, benefits, and alternatives of surgery and  decided to proceed with an L4-5  decompression and fusion.   DESCRIPTION OF PROCEDURE:  The patient was brought to the operating room by  the anesthesia team.  General endotracheal anesthesia was induced.  The  patient was then turned to the prone position on the Wilson frame.  His  lumbosacral region was then prepared with Betadine scrub and Betadine  solution.  Sterile drapes were applied.  I then injected the area to be  incised with Marcaine with epinephrine solution.  I used scalpel to make a  linear midline incision over the L4-5 interspace.  I used electrocautery to  perform a bilateral subperiosteal dissection, exposing the spinous processes  and lamina of L4 and L5.  I inserted the Versatrac retractor for exposure  and then obtained the intraoperative radiograph to confirm our location.  I  then used a scalpel to incise the L3-4 and L4-5 interspinous ligament.  We  then used the Leksell rongeur to remove the spinous process at  L4 as well as  part of the L4 lamina.  This bone was saved and later cleared of soft tissue  and used in the fusion process.   We then used the high-speed drill to perform bilateral L4 laminotomies.  We  then used a Kerrison punch to remove the ligamentum flavum at L4-5.  We  removed the remainder of the L4 lamina.  There was considerable lateral  recess stenosis and particularly foraminal stenosis around the L4 nerve  root, and additional decompression was required bilaterally.  We also used  the Kerrison punch to remove the L3-4 ligamentum flavum.  We used a Kerrison  punch to remove the ligamentum flavum at L3-4.  Because there was some  stenosis on the right at L3-4, we used the high-speed drill to perform a  right L3 laminotomy and performed a foraminotomy about the L4 nerve root in  the lateral recess, removing the redundant ligamentum flavum, further  decompressing the nerve root up at L3-4.  At this point we had a good  decompression of the bilateral L4 and L5 nerve roots, and this  completed the  decompression.   We now turned our attention to posterior lumbar interbody fusion.  We  carefully retracted the thecal sac and L5 nerve root medially with the  D'Errico retractor and incised the L4-5 intervertebral disk.  We performed  an aggressive bilateral diskectomy using pituitary forceps and Epstein and  Scoville curettes.  We then distracted the contralateral L4-5 disk space and  then prepared the ipsilateral disk space using the curettes to remove all  the soft tissue from the vertebral end plates in preparation for the fusion.  We then inserted a 12 x 26 mm Capstone Peek cage, which had been pre-filled  with local morcellized autograft bone and Vitoss bone scaffolding into the  distracted L4-5 interspace, of course, after retracting the neural  structures out of harm's way.  We then removed the distractor from the  contralateral side and repeated this procedure on the contralateral side,  filling in between the cages with a combination of local morcellized  autograft bone and Vitoss bone scaffolding, completing the posterior lumbar  interbody fusion.   We then turned our attention to posterior nonsegmental instrumentation.  We  used the electrocautery to expose the bilateral transverse process at L4 and  L5.  Then under fluoroscopic guidance we cannulated the bilateral L4 and L5  pedicles with the bone probes, tapped the pedicles, probed inside the tapped  pedicles to rule out cortical breaches, and then inserted 6.5 x 50 mm  pedicle screws bilaterally at L4 and the 6.5 x 45 mm pedicle screw at L5 on  the right and then a 7.5 x 45 mm pedicle screw at L5 on the left.  We then  probed along the medial aspect of the bilateral L4 and L5 pedicles and noted  that there were no cortical breaches and that the L4 and L5 nerve roots were  not harmed in any way.  We then connected unilateral pedicle screws with a lordotic rod.  We compressed the construct and then secured  the rod in place  using the caps, which were tightened appropriately, completing the  instrumentation.   We now turned our attention to the posterolateral arthrodesis.  We used the  high-speed drill to decorticate the remainder of the L4-5 facet joint, the  L4 pars region, the bilateral transverse process of L4 and L5, and then we  laid a combination of local  morcellized autograft bone and Vitoss bone  scaffolding over these decorticated posterolateral structures, completing  the posterolateral arthrodesis.   We then inspected the thecal sac and the bilateral L4 and L5 nerve roots and  noted that the neural structures were well-decompressed.  We obtained  stringent hemostasis using bipolar electrocautery.  We copiously irrigated  the wound out with bacitracin solution and removed the solution and then  removed the Versatrac retractor.  We reapproximated the patient's  thoracolumbar fascia with interrupted #1 Vicryl suture, the subcutaneous  tissue with interrupted 2-0 Vicryl suture, and the skin with Steri-Strips  and Benzoin.  The wound was then coated with  bacitracin ointment and a sterile dressings was applied, the drapes were  removed, and the patient was subsequently returned to a supine position,  where he was extubated by the anesthesia team and transported to the  postanesthesia care unit in stable condition.  All sponge, instrument, and  needle counts were correct at the end of this case.                                               Cristi Loron, M.D.    JDJ/MEDQ  D:  06/24/2004  T:  06/25/2004  Job:  [id number]

## 2011-04-08 NOTE — H&P (Signed)
[name], [name]              ACCOUNT NO.:  [id number]   MEDICAL RECORD NO.:  [id number]          PATIENT TYPE:  INP   LOCATION:  1823                         FACILITY:  [facility]   PHYSICIAN:  Gordy Savers, M.D. LHCDATE OF BIRTH:  11-23-1934   DATE OF ADMISSION:  07/02/2005  DATE OF DISCHARGE:                                HISTORY & PHYSICAL   CHIEF COMPLAINT:  Left leg pain.   HISTORY OF PRESENT ILLNESS:  The patient is a 76 year old white gentleman  who was stable until approximately five days ago when he noted pain  involving the left medial knee area.  He initially attributed this to a  strain.  Over the past two to three days, he has had increasing pain and  swelling.  Two days prior to admission, according to his wife, he developed  some confusion and flu-like symptoms.  Because of worsening pain, he  presented to a local urgent care where he was noted to have a left leg  cellulitis.  Also, he was noted to have atrial fibrillation with a rapid  ventricular response.  He is now admitted for further evaluation and  treatment.   PAST MEDICAL HISTORY:  1.  The patient was admitted to the hospital approximately one year ago for      a L4-5 fusion.  This was later complicated by a wound infection that      required I&D.  This was subsequently determined to be methicillin-      sensitive Staphylococcus aureus species.  He was initially treated with      vancomycin and Rocephin, but after sensitivities, was treated with      prolonged Ancef therapy.  2.  In October 2005, he was admitted briefly for syncope and paroxysmal      atrial fibrillation.  During this hospital period, he had some confusion      and delirium which was felt most likely related to alcohol withdrawal.  3.  Type 2 diabetes.  4.  Hypertension.  5.  Gastroesophageal reflux disease.   PAST SURGICAL HISTORY:  1.  Remote surgery for a right rotator cuff tear.  2.  Surgery approximately 35 years ago for  a ruptured right rectus muscle.   He is followed closely by cardiology due to his paroxysmal atrial  fibrillation, and did have a Cardiolite stress test in May 2005.  This  revealed no evidence of ischemia and ejection fraction 71%.   MEDICATIONS:  1.  Valium 5 mg daily p.r.n.  2.  Avapro 150 mg b.i.d.  3.  Furosemide 40 mg daily.  4.  Potassium chloride 20 mEq daily.  5.  Protonix 40 mg daily.  6.  Coumadin 4 mg alternating with 2 mg every other day.  7.  Metformin 500 mg b.i.d.  8.  Glyburide 2.5 mg b.i.d.  9.  Diltiazem 240 mg daily.  10. Oxycodone 5 mg q.6h. p.r.n. pain.  11. Viscan 5 mg daily.   SOCIAL HISTORY:  Lives with his wife.  No history of tobacco or drug use.  Has had some heavy ethanol use in the  past.  Two children.  Retired from  [employer].   FAMILY HISTORY:  Noncontributory.  Both parents died elderly.   PHYSICAL EXAMINATION:  GENERAL:  An elderly white male, moderately  overweight, in no acute distress.  VITAL SIGNS:  Blood pressure was 140/80, pulse rate 135 to 145 and  irregular.  HEENT:  Normal fundi.  Ears, nose, and throat clear.  NECK:  No bruits or adenopathy.  CHEST:  Clear.  CARDIOVASCULAR:  Irregular rhythm without murmur.  ABDOMEN:  Soft and nontender, no organomegaly.  GENITOURINARY:  External genitalia normal.  EXTREMITIES:  Full peripheral pulses.  There is considerable erythema,  excess of warmth, and some soft tissue swelling involving his left medial  leg.  This involved both the calf area to the mid position to just proximal  to the medial knee.  NEUROLOGIC:  Negative.   IMPRESSION:  1.  Left leg cellulitis.  2.  History of methicillin-sensitive Staphylococcus aureus.  3.  Atrial fibrillation with rapid ventricular response.   ADDITIONAL DIAGNOSES:  1.  Type 2 diabetes.  2.  Hypertension.  3.  History of alcohol use.   DISPOSITION:  The patient will be admitted to the hospital.  He will be  placed on parenteral antibiotic therapy.   Blood cultures will be obtained.  There are no areas of fluctuance or pustules for culture.  CBC, prothrombin  time will be reviewed.  He will be maintained on his Coumadin.  In view of  his uncontrolled atrial fibrillation, he will placed on a Cardizem infusion.  Heart rate will be monitored.  It is hopeful he will spontaneously convert  to a normal sinus rhythm soon.  Accu-Chek blood sugars will be monitored,  and he will be covered with sliding scale regimen as Humalog.     __    PFK/MEDQ  D:  07/02/2005  T:  07/02/2005  Job:  [id number]

## 2011-04-08 NOTE — Consult Note (Signed)
NAME:  [name], [name]                        ACCOUNT NO.:  [id number]   MEDICAL RECORD NO.:  [id number]                   PATIENT TYPE:  INP   LOCATION:  3743                                 FACILITY:  [facility]   PHYSICIAN:  Jonelle Sidle, M.D. [facility]        DATE OF BIRTH:  [birth year]   DATE OF CONSULTATION:  07/24/2004  DATE OF DISCHARGE:                                   CONSULTATION   REFERRING PHYSICIAN:  Cristi Loron, M.D.   REASON FOR CONSULTATION:  Atrial fibrillation and recent episode of syncope.   HISTORY OF PRESENT ILLNESS:  [name] is a pleasant 76 year old gentleman  evaluated by [place] as an inpatient consultation on August 15 with atrial  fibrillation with rapid ventricular response.  He has a history of  hypertension, type 2 diabetes mellitus, chronic right bundle branch block,  and spinal stenosis, status post lumbar surgery back on August 4.  He was  admitted on August 15 with evidence of a wound infection and taken to the  operating room at that time for incision and drainage.  It sounds by the  patient's description as if he has been managed with home health antibiotics  and has been doing reasonably well in this respect.  He was noted during  that hospital stay to be in rapid atrial fibrillation of uncertain direction  and initially was managed with a strategy of rate control.  I do not have a  discharge summary at my disposal at this time and the old chart is not yet  available; however, it sounds as if he was seen by Dr. Graciela Husbands during that  stay and continued on Cardizem CD with the eventual addition of Coumadin.  I  am not certain about follow-up plans based on available information.   In any event, [name] denies being bothered by any rapid palpitations,  chest pain, or shortness of breath, although over the last several days he  describes problems with significant weight gain and peripheral edema of at  least 20 pounds.  He has been evaluated by  Dr. Cato Mulligan most recently and is  taking some dose of Lasix at home as well as wearing compression hose.  He  apparently also had recent lower extremity Dopplers, apparently by Dr.  __________, and the patient tells me that he did not have evidence of deep  venous thrombosis, although there is apparently some discrepancy based on  information available in the history and physical.  [name] states that  he has had some reduction in some weight, only states at about two pounds,  but is still experiencing a significant amount of edema in his lower  extremities.  He states that he has been somewhat short of breath as well.   This morning he states that he was ambulating with a cane as per usual to  his room and suddenly became aware that he was falling.  He denies tripping,  and  it is not entirely certain as to whether he lost consciousness as he  remembers falling and hitting the floor on his side after hitting his head  on a filing cabinet.  He states that with the assistance of his wife he got  up, ambulated into the bathroom to sit down, and ultimately after contacting  emergency medical services presented to the emergency department.  He  sustained a laceration on the posterior aspect of the right side of his  skull, which is being followed at this time by Dr. Lovell Sheehan.  He apparently  had significant amount of bleeding here initially, although his hemoglobin  is stable compared to prior results.  He had a head CT scan, which by verbal  report showed no acute process although I do not have the formal report for  review as yet.  The patient was noted to be in rapid atrial fibrillation up  to 160 beats per minute on presentation to the emergency department,  received intravenous Cardizem 20 mg with decrease in heart rate down into  the 110 range, and we have been asked to evaluate his cardiac status.  Mr.  Cross is not certain about the doses of his current medications at this  time,  and I do not have the records of his recent outpatient evaluation for  review.  His electrocardiogram today initially showed rapid atrial  fibrillation, 137 beats per minute, with right bundle branch block pattern  and diffuse nonspecific ST-T wave changes.  At present he is comfortable and  denying any chest pain or shortness of breath.   ALLERGIES:  No known drug allergies.   Medications on admission are not entirely clear.  The patient is taking  Coumadin and sounds to have been taking this for at least a week.  He is  also taking Cardizem CD 120 mg p.o. daily.  Other medications apparently  include Glyburide, potassium supplements, and Lasix.  I do not have any  other specifics.   Past medical history, social history, and family history were reviewed in my  prior consultation note.  Recent hospital information is provided in the  hospital as well.   PHYSICAL EXAMINATION:  VITAL SIGNS:  The present heart rate is approximately  110 beats per minute in atrial fibrillation.  Temperature is 97.7 degrees,  blood pressure 143/81, respirations 24.  GENERAL:  This is an elderly male lying nearly supine in bed, in no acute  respiratory distress.  NECK:  No elevated jugular venous pressure or carotid bruits.  CHEST:  Lungs are clear with nonlabored breathing.  CARDIAC:  An irregularly irregular rhythm without pericardial rub or obvious  S3 gallop.  S1 is split.  There is a soft systolic murmur noted at the apex.  ABDOMEN:  Obese and nontender to palpation with normoactive bowel sounds.  EXTREMITIES:  Significant 3+ pitting edema bilaterally.  He is wearing  compression hose.  The peripheral pulses are difficult to palpate due to the  amount of edema.  There is no focal pain on palpation of the lower  extremities, although he states that they are uncomfortable in general. SKIN:  No ulcerative changes.  There is a laceration with some local  swelling on the head as described previously.  This  area is dressed  currently.   LABORATORY DATA:  Hemoglobin 10.2, previously noted to be 10.6.  INR is 1.9.  Sodium is 135, potassium is 4.6, glucose 137, BUN 14, creatinine 1.0.  Initial troponin I less than  0.05, CK-MB 3.8.  Myoglobin greater than 500.   Chest x-ray from the 24th showed mild CHF versus fluid overload with PICC  tip in the right innominate vein.  Follow-up film is pending.   IMPRESSION:  1.  Atrial fibrillation with rapid ventricular response.  I suspect that      this is paroxysmal based on available information.  The patient does not      feel rapid palpitations, although he has been short of breath recently,      has evidence of fluid overload.  Previous cardiac evaluation revealed no      ischemia by Cardiolite of this year, with an ejection fraction of 71% at      that time.  He has not had a recent echocardiogram.  Initial cardiac      markers are fairly reassuring.  The patient has not had any recent chest      pain.  2.  Episode of possible syncope in association with above.  The etiology is      not entirely clear but could have been arrhythmogenic.  Given      significant peripheral edema and the questions about deep venous      thrombus, a pulmonary embolus could also be considered although the      patient is currently on Coumadin with an INR of 1.9, previously 2.0 a      few days ago.  3.  History of spinal stenosis, status post surgery, complicated by wound      infection, with incision and drainage in August, currently on      antibiotics via a peripherally-inserted central catheter line at home.      This has apparently been stable recently.  4.  History of type 2 diabetes mellitus.  5.  History of hypertension.  6.  Chronic right bundle branch block, question underlying conduction system      disease and sick sinus syndrome, although objectively this is not yet      apparent based on available information.   RECOMMENDATIONS:  1.  At this point  would increase Cardizem CD to 240 mg p.o. daily and      continue telemetry, watching for potential bradycardia or pauses.  If      Coumadin can be continued with recent laceration, would try to do so as      this may simply care of his atrial fibrillation.  2.  Agree with cycling cardiac markers.  Would also suggest an      echocardiogram to re-evaluate left ventricular and right ventricular      performance, given significant weight gain and peripheral edema.  3.  Will check a D-dimer level and lower extremity Dopplers to exclude a      deep venous thrombosis, even though the patient is on Coumadin at      present.  We might even need to consider a CT scan of the chest if      information becomes more suggestive of pulmonary embolus.  4.  Need to obtain the old hospital chart to clarify the patient's hospital      course in late August. 5.  Further recommendations to follow.                                               Jonelle Sidle, M.D. [facility]  SGM/MEDQ  D:  07/24/2004  T:  07/24/2004  Job:  [id number]   cc:   Cristi Loron, M.D.  [address]  [place]  [place] [address]  Fax: [phone]

## 2011-04-08 NOTE — H&P (Signed)
NAME:  [name], [name]                        ACCOUNT NO.:  [id number]   MEDICAL RECORD NO.:  [id number]                   PATIENT TYPE:  INP   LOCATION:  3029                                 FACILITY:  [facility]   PHYSICIAN:  Hilda Lias, M.D.                DATE OF BIRTH:  December 10, 1934   DATE OF ADMISSION:  07/05/2004  DATE OF DISCHARGE:                                HISTORY & PHYSICAL   HISTORY OF PRESENT ILLNESS:  [name] is a patient of Dr. Delma Officer,  who underwent lumbar fusion at the level of L4-L5 secondary to  spondylolisthesis.  The patient did well, had no problems with swelling, and  today I was called by his wife because he had been draining some bloody  discharge from the wound.  Since Dr. Lovell Cross is out of town, I brought him  to my office.  He came by ambulance.  According to his wife, his temperature  went up to 101 and he had been draining quite a bit bloody drainage since  last night.  I took a look at the wound and because of that we decided to  bring him immediately to the hospital for further care.   PAST MEDICAL HISTORY:  1. Diabetes.  2. Rotator cuff injury.   MEDICATIONS:  He is taking medication for his diabetes.  He also is taking  Protonix.   PAST SURGICAL HISTORY:  1. Repair of his shoulder.  2. Fusion done by Dr. Lovell Cross about 10 days ago.   PHYSICAL EXAMINATION:  HEAD, EARS, NOSE, THROAT:  Normal.  NECK:  Normal.  LUNGS:  Clear.  HEART:  Heart sounds are normal.  ABDOMEN:  Normal.  EXTREMITIES:  Normal pulses.  NEURO:  Mental status normal.  Cranial nerves normal.  His strength is  normal.  Sensation is normal.  BACK:  The lumbar wound showed that there was some drainage and there is  quite a bit of opening in the upper part with quite a bit of bloody  discharge.   CLINICAL IMPRESSION:  Wound infection.   RECOMMENDATIONS:  The patient is being admitted for further workup.  We are  going to obtain cultures of the wound, and we are  going to start him on  antibiotics.  The patient is having quite a bit of tachycardia and although  in the past he was cleared by the [facility] cardiologists, we are going to  call them to help [place].  We will make a decision if, indeed, we will need to  open the wound.  We are going to obtain a CT scan of the lumbar spine early  in the morning.  Hilda Lias, M.D.   EB/MEDQ  D:  07/05/2004  T:  07/05/2004  Job:  [id number]

## 2011-04-08 NOTE — Consult Note (Signed)
[name], [name]                        ACCOUNT NO.:  [id number]   MEDICAL RECORD NO.:  [id number]                   PATIENT TYPE:  INP   LOCATION:  2101                                 FACILITY:  [facility]   PHYSICIAN:  Jonelle Sidle, M.D. [facility]        DATE OF BIRTH:  [birth year]   DATE OF CONSULTATION:  07/05/2004  DATE OF DISCHARGE:                                   CONSULTATION   REASON FOR CONSULTATION:  Tachycardia.   HISTORY OF PRESENT ILLNESS:  [name] is a 76 year old male with a  history of type 2 diabetes mellitus, hypertension, and spinal stenosis who  is status post recent lumbar surgery on the 4th of this month. He has no  clearly documented history of obstructive coronary artery disease and in  fact, underwent an outpatient Adenosine Cardiolite on Mar 31, 2004, which  showed an ejection fraction of 71% with no clear evidence of ischemia. He  denies any previous history of cardiac dysrhythmias. He is now admitted to  the hospital with a possible wound infection. The patient's wife states that  he has been confused over the last 48 hours and today, was noted to have  bleeding around his lumbar spine site as well as a temperature of greater  than 101 degrees. He was subsequently admitted to the hospital and is now  being treated with Vancomycin. He remains febrile with the most recent  temperature being 102 degrees. He was noted to be tachycardiac with an  irregular heart rate on his initial assessment and electrocardiogram  revealed rapid atrial fibrillation with heart rates in the 160 to 170 range.  We have been asked to evaluate him further. At present, he denies having any  palpitations or chest pain. His blood pressure has been with systolics in  the 80 to 100 range.   ALLERGIES:  No known drug allergies.   ADMISSION MEDICATIONS:  Include Lotensin 20 mg p.o. q.d., Glucovance 2.5/500  mg p.o. b.i.d., Lexapro 10 mg p.o. q.d., Benicar 25/40 mg p.o. q.d.,  and  Protonix 40 mg p.o. q.d.   PAST MEDICAL HISTORY:  1. No clear history of coronary artery disease or myocardial infarction. The     patient had a normal Adenosine Cardiolite back in May of this year     showing an ejection fraction of 71% with no clear ischemia.  2. Type 2 diabetes mellitus.  3. Hypertension.  4. History of right bundle branch block on electrocardiogram just prior to     surgery.  5. History of hyponatremia.  6. History of gastroesophageal reflux disease and esophageal stricture.  7. History of spinal stenosis status post recent lumbar surgery.   SOCIAL HISTORY:  The patient lives in [place] with his wife. He has a  history of alcohol use although none in the last 3 weeks. He has no history  of tobacco use.   FAMILY HISTORY:  Noncontributory.   REVIEW OF SYSTEMS:  As described in the history of present illness.   PHYSICAL EXAMINATION:  VITAL SIGNS:  Most recent temperature 102 degrees.  Heart rate now in the 140 to 150 range in rapid atrial fibrillation. Blood  pressure 102/74. Oxygen saturation 94% on room air.  GENERAL:  This is an elderly male lying nearly supine. In no acute distress.  NECK:  Examination reveals no jugular venous pressure, carotid bruits, and  no thyromegaly is noted.  LUNGS:  Exhibit diminished breath sounds with no active rales or wheezing.  CARDIAC:  Examination reveals an irregularly irregular rhythm without  discernable S3 gallop or pericardial rub.  ABDOMEN:  Soft with diminished bowel sounds but no tenderness to palpation.  EXTREMITIES:  No edema. Peripheral pulses are diminished at 1+. There is  some ecchymosis on the right hip and thigh area.   LABORATORY DATA:  Chest x-ray is not yet available.   White blood cell count is 15.8, hemoglobin 11.1, platelets 255,000.  Erythrocyte sedimentation rate 118. Sodium 126, potassium 5.0, BUN 32,  creatinine 1.6, glucose 191.   IMPRESSION:  1. Rapid atrial fibrillation of uncertain  duration in a 76 year old male     with no clear history of coronary artery disease status post recent     lumbar surgery with apparent wound infection. He is denying any active     chest pain or shortness of breath.  2. History of type 2 diabetes mellitus.  3. History of hypertension.  4. Known right bundle branch block on electrocardiogram from preoperative     state. This persists on the present electrocardiogram.   RECOMMENDATIONS:  1. Will try to attain rate control with either beta blocker or calcium     channel block therapy, bearing in mind the patient's blood pressure.     Would like to avoid cardioversion and anticoagulation at this point,     given the fact that we are not certain about the duration of the     arrhythmia and that he has had a recent surgery with some evidence of     bleeding and infection today.  2. Transfer to step-down for better hemodynamic monitoring and rhythm     monitoring.  3. Will ultimately followup with electrocardiogram and echocardiogram once     rate is better controlled.  4. Further plans to followup.                                               Jonelle Sidle, M.D. [facility]    SGM/MEDQ  D:  07/05/2004  T:  07/05/2004  Job:  [id number]

## 2011-04-08 NOTE — Discharge Summary (Signed)
NAMEANTONIO, [name]              ACCOUNT NO.:  [id number]   MEDICAL RECORD NO.:  [id number]          PATIENT TYPE:  INP   LOCATION:  2010                         FACILITY:  [facility]   PHYSICIAN:  Rene Paci, M.D. LHCDATE OF BIRTH:  [birth year]   DATE OF ADMISSION:  07/02/2005  DATE OF DISCHARGE:  07/06/2005                                 DISCHARGE SUMMARY   DISCHARGE DIAGNOSES:  1.  Left lower extremity cellulitis.  2.  Rapid atrial fibrillation.  3.  Diabetes type 2.   HISTORY OF PRESENT ILLNESS:  The patient is a 76 year old male of Dr. Darryll Capers who was admitted for evaluation and treatment of a left lower  extremity cellulitis as well as rapid afib.   PAST MEDICAL HISTORY:  1.  Status post L4-5 fusion July, 2005, with subsequent wound infection      which grew MSSA.  2.  October, 2005, episode of syncope.  3.  History of delirium, question secondary to DTs.  4.  History of rotator cuff tear.  5.  Diabetes type 2.  6.  Hypertension.  7.  GERD.   COURSE OF HOSPITALIZATION:  1.  Left lower extremity cellulitis.  The patient was placed on Ancef and      redness and pain and swelling improved in the left lower extremity      cellulitis.  A left lower extremity Doppler was performed, to rule out      possibility of DVT, which was negative for DVT.  Patient remained      afebrile and a surgical evaluation was obtained.  Per Surgery, there are      no surgical indications at this time relative to cellulitis.  Our      concern was originally that there might be an abscess, underlying      cellulitis; however, surgery does not feel that surgical intervention is      necessary at this time.  Plan to discharge patient to home with Septra      DS 1 tab p.o. twice daily to complete a 10 day course.  Patient will      need close outpatient monitoring of cellulitis.  2.  Rapid afib.  Patient was admitted with rapid afib and initially was      placed on a Cardizem drip.  When  Cardizem drip was discontinued,      patient's heart rate increased, p.o. Cardizem was resumed and heart rate      is currently stable, remaining between 50 and 100 beats per minute.   MEDICATIONS AT DISCHARGE:  1.  Glucophage 500 mg p.o. twice daily.  2.  Glyburide 2.5 mg p.o. twice daily.  3.  Avapro 150 mg p.o. twice daily.  4.  Lasix 40 mg p.o. daily.  5.  K-Dur 20 mEq p.o. daily.  6.  Protonix 40 mg p.o. daily.  7.  Coumadin 7.5 mg p.o. q.h.s., to be adjusted by Dr. Lovell Sheehan.  8.  Pindolol 5 mg p.o. daily.  9.  Cardizem LA 240 mg p.o. daily.  10. Septra DS one tab twice daily for 5  additional days.  11. Prescription has been provided for Coumadin 2.5 mg, the patient is to      take 3 tablets daily at bedtime, a total of 30 times, to be dispensed      with 0 refills.  12. Cardizem LA 240, prescription provided for 30 tablets.  13. Septra DS, prescription provided for 10 tablets.   LABORATORIES AT DISCHARGE:  INR on the date of discharge is 1.2.  Patient  will be discharged home on 7.5 mg of Coumadin; however, this will likely  need to be decreased as patient reaches goal.  TSH was performed which was  1.770.  Hemoglobin 12.7, hematocrit 36.6, platelets 147, white blood cell  count 6.7.   FOLLOWUP:  A followup appointment has been scheduled for patient to see Dr.  Lovell Sheehan on Monday, July 11, 2005, at 9:45 a.m.  In addition, patient is to  call for an appointment in the lab to have a PT, INR drawn on Friday, July 08, 2005.      Melissa S. Peggyann Juba, NP      Rene Paci, M.D. [facility]  Electronically Signed    MSO/MEDQ  D:  07/06/2005  T:  07/06/2005  Job:  [id number]   cc:   Stacie Glaze, M.D. [facility]  [address] [place]  [place] [address]

## 2011-04-08 NOTE — Discharge Summary (Signed)
NAME:  [name], [name]                        ACCOUNT NO.:  [id number]   MEDICAL RECORD NO.:  [id number]                   PATIENT TYPE:  INP   LOCATION:  3039                                 FACILITY:  [facility]   PHYSICIAN:  Cristi Loron, M.D.            DATE OF BIRTH:  [birth year]   DATE OF ADMISSION:  06/24/2004  DATE OF DISCHARGE:  06/28/2004                                 DISCHARGE SUMMARY   BRIEF HISTORY:  The patient is a 76 year old white male who suffers from  severe back and leg pain consistent with neurogenic claudication who has  been worked up with a lumbar MRI which demonstrated L4-5 spondylolisthesis  and spinal stenosis. I discussed the various treatments with him including  surgery. The patient has weighed the risks, benefits, and alternatives to  surgery and decided to proceed with a L3-4 decompression and fusion.   HOSPITAL COURSE:  Admitted the patient to [facility] hospital on June 24, 2004 with diagnosis of L4-5 spondylolisthesis and stenosis. On the day of  admission, I performed a L4-5 fusion. The surgery went well without  complications (for full details of this operation, please refer to the typed  operative note).   POSTOPERATIVE COURSE:  The patient's postoperative course was unremarkable.  By postoperative day #4, the patient was afebrile with vital signs stable.  He was eating well and ambulating well, and his wound was healing well. He  was requesting discharge to home. He was therefore discharged to home on  June 28, 2004.   DISCHARGE PRESCRIPTIONS:  1. Percocet 5 #100 one to two p.r.n. q.4h. p.r.n. for pain.  2. Valium 5 mg #50 one p.o. q.6h. p.r.n. for muscle spasms.   He was instructed to instructed to resume his outpatient medical regimen.   DISCHARGE INSTRUCTIONS:  The patient was instructed to follow up with me in  4 weeks, to wear his lumbosacral corset when out of bed.   FINAL DIAGNOSES:  L4-5 Grade 1 acquired spondylolisthesis,  L4-5 degenerative  disease, spinal stenosis, lumbago, lumbar radiculopathy.   PROCEDURE PERFORMED:  L4 Gill procedure; L4-5 posterior lumbar interbody  fusion; insertion of bilateral L4-5 interbody prosthesis (__________ cages);  posterior nonsegment instrumentation with Legacy titanium pedicle screws and  rods; posterolateral arthrodesis with local morselized autograft bone and  __________ bone scaffolding; right L3 laminotomy foraminotomy to treat  spinal stenosis L3 on the right.                                                Cristi Loron, M.D.    JDJ/MEDQ  D:  07/15/2004  T:  07/16/2004  Job:  [id number]

## 2011-04-19 ENCOUNTER — Other Ambulatory Visit: Payer: Self-pay | Admitting: Internal Medicine

## 2011-05-04 ENCOUNTER — Telehealth: Payer: Self-pay | Admitting: *Deleted

## 2011-05-04 ENCOUNTER — Ambulatory Visit (INDEPENDENT_AMBULATORY_CARE_PROVIDER_SITE_OTHER): Payer: Medicare Other | Admitting: Internal Medicine

## 2011-05-04 ENCOUNTER — Encounter: Payer: Self-pay | Admitting: Internal Medicine

## 2011-05-04 VITALS — BP 130/80 | HR 76 | Temp 98.2°F | Resp 16 | Ht 70.0 in | Wt 202.0 lb

## 2011-05-04 DIAGNOSIS — M549 Dorsalgia, unspecified: Secondary | ICD-10-CM

## 2011-05-04 DIAGNOSIS — M109 Gout, unspecified: Secondary | ICD-10-CM

## 2011-05-04 DIAGNOSIS — I509 Heart failure, unspecified: Secondary | ICD-10-CM

## 2011-05-04 DIAGNOSIS — E119 Type 2 diabetes mellitus without complications: Secondary | ICD-10-CM

## 2011-05-04 DIAGNOSIS — I10 Essential (primary) hypertension: Secondary | ICD-10-CM

## 2011-05-04 DIAGNOSIS — I4891 Unspecified atrial fibrillation: Secondary | ICD-10-CM

## 2011-05-04 MED ORDER — OXYCODONE HCL 10 MG PO TB12: 10.0000 mg | ORAL_TABLET | Freq: Three times a day (TID) | ORAL | Status: DC

## 2011-05-04 MED ORDER — OXYCODONE-ACETAMINOPHEN 10-325 MG PO TABS: 1.0000 | ORAL_TABLET | Freq: Four times a day (QID) | ORAL | Status: DC

## 2011-05-04 NOTE — Progress Notes (Signed)
Addended by: Rita Ohara R on: 05/04/2011 02:25 PM   Modules accepted: Orders

## 2011-05-04 NOTE — Patient Instructions (Signed)
Same coumadin dose 

## 2011-05-04 NOTE — Progress Notes (Signed)
  Subjective:    Patient ID: [name], male    DOB: 1935-07-24, 76 y.o.   MRN: [id number]  HPI The pts blood sugars have been in the 120 fasting range His blood pressure has been well controlled He has not had any gout He has not noted any rate increases He needs a pro time His pain control has not been adequately with current medications    Review of Systems  Constitutional: Negative for fever and fatigue.  HENT: Negative for hearing loss, congestion, neck pain and postnasal drip.   Eyes: Negative for discharge, redness and visual disturbance.  Respiratory: Negative for cough, shortness of breath and wheezing.   Cardiovascular: Negative for leg swelling.  Gastrointestinal: Negative for abdominal pain, constipation and abdominal distention.  Genitourinary: Negative for urgency and frequency.  Musculoskeletal: Negative for joint swelling and arthralgias.  Skin: Negative for color change and rash.  Neurological: Negative for weakness and light-headedness.  Hematological: Negative for adenopathy.  Psychiatric/Behavioral: Negative for behavioral problems.   Past Medical History  Diagnosis Date  . Fatigue   . Overweight   . Syncope and collapse   . GERD (gastroesophageal reflux disease)   . Right bundle branch block   . Unspecified venous (peripheral) insufficiency   . Phlebitis and thrombophlebitis of superficial vessels of lower extremities   . Cellulitis and abscess of leg, except foot   . Backache, unspecified   . Encounter for long-term (current) use of anticoagulants   . Type II or unspecified type diabetes mellitus without mention of complication, not stated as uncontrolled   . Unspecified venous (peripheral) insufficiency   . Hypertension   . Gout, unspecified   . Heart failure   . Atrial fibrillation   . Esophageal stricture   . Colon polyps   . Diverticulosis    Past Surgical History  Procedure Date  . Laminectomy   . Lumbar fusion   . Rotator cuff  repair   . Ruptured rt rectus muscle   . Eye surgery     reports that he has never smoked. He does not have any smokeless tobacco history on file. He reports that he drinks alcohol. He reports that he does not use illicit drugs. family history includes Heart disease in his father and Pneumonia in his mother. Allergies  Allergen Reactions  . Morphine     REACTION: rash, irritability       Objective:   Physical Exam  Constitutional: He appears well-developed and well-nourished.  HENT:  Head: Normocephalic and atraumatic.  Eyes: Conjunctivae are normal. Pupils are equal, round, and reactive to light.  Neck: Normal range of motion. Neck supple.  Cardiovascular: Normal rate and regular rhythm.   Pulmonary/Chest: Effort normal and breath sounds normal.  Abdominal: Soft. Bowel sounds are normal.          Assessment & Plan:  Back pain control Is the main issue He is followed by podiatry for diabetic exams Blood pressure is stable Change to ox contin 10 TID

## 2011-05-04 NOTE — Telephone Encounter (Signed)
[facility] CALLED AND STATES MR [name] WANTS PERCOCET NOT OXYCONTIN DUE TO COST-TALKED WITH DR Lovell Sheehan AND HE SAID THAT WOULD BE FINE-0 M,AY GIVE HIM 3 SCRIPTS OF PERCOCET 10-325 #120 AND MAY HAVE 4TIMES A DAY AND 3 SCRIPTS

## 2011-05-11 ENCOUNTER — Other Ambulatory Visit: Payer: Self-pay | Admitting: Internal Medicine

## 2011-05-22 ENCOUNTER — Other Ambulatory Visit: Payer: Self-pay | Admitting: Internal Medicine

## 2011-05-23 ENCOUNTER — Other Ambulatory Visit: Payer: Self-pay | Admitting: Internal Medicine

## 2011-06-22 HISTORY — PX: PACEMAKER INSERTION: SHX728

## 2011-07-04 ENCOUNTER — Ambulatory Visit (INDEPENDENT_AMBULATORY_CARE_PROVIDER_SITE_OTHER): Payer: Medicare Other | Admitting: Internal Medicine

## 2011-07-04 ENCOUNTER — Emergency Department (HOSPITAL_COMMUNITY): Payer: Medicare Other

## 2011-07-04 ENCOUNTER — Encounter: Payer: Self-pay | Admitting: Internal Medicine

## 2011-07-04 ENCOUNTER — Ambulatory Visit: Payer: Medicare Other | Admitting: Internal Medicine

## 2011-07-04 ENCOUNTER — Inpatient Hospital Stay (HOSPITAL_COMMUNITY)
Admission: EM | Admit: 2011-07-04 | Discharge: 2011-07-12 | DRG: 243 | Disposition: A | Discharge status: Skilled Nursing Facility | Payer: Medicare Other | Attending: Family Medicine | Admitting: Family Medicine

## 2011-07-04 VITALS — BP 140/70 | HR 76 | Temp 98.1°F | Resp 16 | Ht 70.0 in | Wt 202.0 lb

## 2011-07-04 DIAGNOSIS — Z8673 Personal history of transient ischemic attack (TIA), and cerebral infarction without residual deficits: Secondary | ICD-10-CM

## 2011-07-04 DIAGNOSIS — Z8614 Personal history of Methicillin resistant Staphylococcus aureus infection: Secondary | ICD-10-CM

## 2011-07-04 DIAGNOSIS — I4510 Unspecified right bundle-branch block: Secondary | ICD-10-CM | POA: Diagnosis present

## 2011-07-04 DIAGNOSIS — IMO0001 Reserved for inherently not codable concepts without codable children: Secondary | ICD-10-CM | POA: Diagnosis present

## 2011-07-04 DIAGNOSIS — M48062 Spinal stenosis, lumbar region with neurogenic claudication: Secondary | ICD-10-CM

## 2011-07-04 DIAGNOSIS — E669 Obesity, unspecified: Secondary | ICD-10-CM | POA: Diagnosis present

## 2011-07-04 DIAGNOSIS — I4891 Unspecified atrial fibrillation: Secondary | ICD-10-CM

## 2011-07-04 DIAGNOSIS — W1809XA Striking against other object with subsequent fall, initial encounter: Secondary | ICD-10-CM | POA: Diagnosis present

## 2011-07-04 DIAGNOSIS — K219 Gastro-esophageal reflux disease without esophagitis: Secondary | ICD-10-CM | POA: Diagnosis present

## 2011-07-04 DIAGNOSIS — F1010 Alcohol abuse, uncomplicated: Secondary | ICD-10-CM | POA: Diagnosis present

## 2011-07-04 DIAGNOSIS — Y92009 Unspecified place in unspecified non-institutional (private) residence as the place of occurrence of the external cause: Secondary | ICD-10-CM

## 2011-07-04 DIAGNOSIS — I10 Essential (primary) hypertension: Secondary | ICD-10-CM

## 2011-07-04 DIAGNOSIS — M5416 Radiculopathy, lumbar region: Secondary | ICD-10-CM

## 2011-07-04 DIAGNOSIS — IMO0002 Reserved for concepts with insufficient information to code with codable children: Secondary | ICD-10-CM

## 2011-07-04 DIAGNOSIS — S62319A Displaced fracture of base of unspecified metacarpal bone, initial encounter for closed fracture: Secondary | ICD-10-CM | POA: Diagnosis present

## 2011-07-04 DIAGNOSIS — Z7901 Long term (current) use of anticoagulants: Secondary | ICD-10-CM

## 2011-07-04 DIAGNOSIS — R5381 Other malaise: Secondary | ICD-10-CM | POA: Diagnosis not present

## 2011-07-04 DIAGNOSIS — Z981 Arthrodesis status: Secondary | ICD-10-CM

## 2011-07-04 DIAGNOSIS — E871 Hypo-osmolality and hyponatremia: Secondary | ICD-10-CM | POA: Diagnosis not present

## 2011-07-04 DIAGNOSIS — Z794 Long term (current) use of insulin: Secondary | ICD-10-CM

## 2011-07-04 DIAGNOSIS — Z87891 Personal history of nicotine dependence: Secondary | ICD-10-CM

## 2011-07-04 DIAGNOSIS — K5730 Diverticulosis of large intestine without perforation or abscess without bleeding: Secondary | ICD-10-CM | POA: Diagnosis present

## 2011-07-04 DIAGNOSIS — I495 Sick sinus syndrome: Principal | ICD-10-CM | POA: Diagnosis present

## 2011-07-04 LAB — DIFFERENTIAL
Eosinophils Absolute: 0.1 10*3/uL (ref 0.0–0.7)
Eosinophils Relative: 2 % (ref 0–5)
Lymphs Abs: 1.8 10*3/uL (ref 0.7–4.0)
Monocytes Absolute: 0.5 10*3/uL (ref 0.1–1.0)

## 2011-07-04 LAB — CBC
MCHC: 35.4 g/dL (ref 30.0–36.0)
MCV: 91.4 fL (ref 78.0–100.0)
Platelets: 158 10*3/uL (ref 150–400)
RDW: 13.5 % (ref 11.5–15.5)
WBC: 7.7 10*3/uL (ref 4.0–10.5)

## 2011-07-04 LAB — POCT I-STAT, CHEM 8
Calcium, Ion: 1.03 mmol/L — ABNORMAL LOW (ref 1.12–1.32)
Calcium, Ion: 1.05 mmol/L — ABNORMAL LOW (ref 1.12–1.32)
Chloride: 93 mEq/L — ABNORMAL LOW (ref 96–112)
Creatinine, Ser: 1.1 mg/dL (ref 0.50–1.35)
Glucose, Bld: 180 mg/dL — ABNORMAL HIGH (ref 70–99)
Glucose, Bld: 215 mg/dL — ABNORMAL HIGH (ref 70–99)
HCT: 40 % (ref 39.0–52.0)
Hemoglobin: 13.6 g/dL (ref 13.0–17.0)
Hemoglobin: 13.9 g/dL (ref 13.0–17.0)
Sodium: 134 mEq/L — ABNORMAL LOW (ref 135–145)
TCO2: 28 mmol/L (ref 0–100)

## 2011-07-04 LAB — BASIC METABOLIC PANEL
BUN: 13 mg/dL (ref 6–23)
Calcium: 8.7 mg/dL (ref 8.4–10.5)
GFR: 62.29 mL/min (ref >60.00)
Glucose, Bld: 270 mg/dL — ABNORMAL HIGH (ref 70–99)

## 2011-07-04 MED ORDER — OXYCODONE-ACETAMINOPHEN 10-325 MG PO TABS: 1.0000 | ORAL_TABLET | Freq: Four times a day (QID) | ORAL | Status: DC

## 2011-07-04 NOTE — Progress Notes (Signed)
Addended by: Bonnye Fava on: 07/04/2011 11:48 AM   Modules accepted: Orders

## 2011-07-04 NOTE — Patient Instructions (Signed)
Same dose,7.5 mg on wednesdays and saturdays 5 mg on other days,check in 4 weeks

## 2011-07-04 NOTE — Progress Notes (Signed)
Subjective:    Patient ID: [name], male    DOB: [birth year], 76 y.o.   MRN: [id number]  HPI The back pain has increased. He is concerned over the percocet use. The pt has noted some gate issues and spasms near the surgical scar. The pt had a hx of infection post op. The surgery was in 2005. The surgery was done by Delma Officer  The patient had pedicle screws and cages used in surgery  Complications and had debridement of infection in the area by Dr. Jeral Fruit after surgery.  He had a very complicated healing course. Reading the operative report is apparent that he had both degenerative disc disease with pigment of the nerves as well as spinal stenosis. Since his pain is primarily with standing now I think spinal stenosis probably plays a role. I believe that we should get an imaging study and specific localized pain treatment.     Review of Systems  Unable to perform ROS Constitutional: Negative for fever and fatigue.  HENT: Negative for hearing loss, congestion, neck pain and postnasal drip.   Eyes: Negative for discharge, redness and visual disturbance.  Respiratory: Negative for cough, shortness of breath and wheezing.   Cardiovascular: Negative for leg swelling.  Gastrointestinal: Negative for abdominal pain, constipation and abdominal distention.  Genitourinary: Negative for urgency and frequency.  Musculoskeletal: Negative for joint swelling and arthralgias.  Skin: Negative for color change and rash.  Neurological: Negative for weakness and light-headedness.  Hematological: Negative for adenopathy.  Psychiatric/Behavioral: Negative for behavioral problems.   Past Medical History  Diagnosis Date  . Fatigue   . Overweight   . Syncope and collapse   . GERD (gastroesophageal reflux disease)   . Right bundle branch block   . Unspecified venous (peripheral) insufficiency   . Phlebitis and thrombophlebitis of superficial vessels of lower extremities   . Cellulitis and  abscess of leg, except foot   . Backache, unspecified   . Encounter for long-term (current) use of anticoagulants   . Type II or unspecified type diabetes mellitus without mention of complication, not stated as uncontrolled   . Unspecified venous (peripheral) insufficiency   . Hypertension   . Gout, unspecified   . Heart failure   . Atrial fibrillation   . Esophageal stricture   . Colon polyps   . Diverticulosis    Past Surgical History  Procedure Date  . Laminectomy   . Lumbar fusion   . Rotator cuff repair   . Ruptured rt rectus muscle   . Eye surgery     reports that he has never smoked. He does not have any smokeless tobacco history on file. He reports that he drinks alcohol. He reports that he does not use illicit drugs. family history includes Heart disease in his father and Pneumonia in his mother. Allergies  Allergen Reactions  . Morphine     REACTION: rash, irritability       Objective:   Physical Exam  Constitutional: He is oriented to person, place, and time. He appears well-developed and well-nourished.  HENT:  Head: Normocephalic and atraumatic.  Eyes: Conjunctivae are normal. Pupils are equal, round, and reactive to light.  Cardiovascular: Regular rhythm.   Pulmonary/Chest: Effort normal and breath sounds normal.  Abdominal: Soft. Bowel sounds are normal.  Genitourinary: Rectum normal.  Musculoskeletal: He exhibits no edema and no tenderness.  Neurological: He is alert and oriented to person, place, and time.  Skin: Skin is warm and dry.   Worsening  radicular pain no weakness or lateralization       Assessment & Plan:  The patient had a prolonged course after his back surgery now he has intermittent pain that is severe enough to require Percocet. He is concerned about recurrence or progression of the disease he did have screws placed as well as cages placed at the time of surgery 2005 I believe we need to delineate his physiology at this point and  consider pain therapy with appropriate epidural blocks.  Discussed the case with radiology. We'll obtain an MRI with and without contrast per the suggestion of the radiologist and it disease he is amenable to medical management we'll pursue epidurals done by interventional radiology.

## 2011-07-05 ENCOUNTER — Telehealth: Payer: Self-pay | Admitting: Internal Medicine

## 2011-07-05 ENCOUNTER — Inpatient Hospital Stay (HOSPITAL_COMMUNITY): Payer: Medicare Other

## 2011-07-05 DIAGNOSIS — R55 Syncope and collapse: Secondary | ICD-10-CM

## 2011-07-05 LAB — ETHANOL: Alcohol, Ethyl (B): <11 mg/dL (ref 0–11)

## 2011-07-05 LAB — DRUGS OF ABUSE SCREEN W/O ALC, ROUTINE URINE
Amphetamine Screen, Ur: NEGATIVE
Barbiturate Quant, Ur: NEGATIVE
Creatinine,U: 70.3 mg/dL
Marijuana Metabolite: NEGATIVE
Propoxyphene: NEGATIVE

## 2011-07-05 LAB — POCT I-STAT TROPONIN I

## 2011-07-05 LAB — CARDIAC PANEL(CRET KIN+CKTOT+MB+TROPI)
CK, MB: 3 ng/mL (ref 0.3–4.0)
CK, MB: 2.5 ng/mL (ref 0.3–4.0)
Relative Index: INVALID (ref 0.0–2.5)
Relative Index: INVALID (ref 0.0–2.5)
Total CK: 62 U/L (ref 7–232)
Troponin I: <0.3 ng/mL (ref <0.30)

## 2011-07-05 LAB — HEMOGLOBIN A1C
Hgb A1c MFr Bld: 8.4 % — ABNORMAL HIGH (ref <5.7)
Mean Plasma Glucose: 194 mg/dL — ABNORMAL HIGH (ref <117)

## 2011-07-05 LAB — GLUCOSE, CAPILLARY: Glucose-Capillary: 294 mg/dL — ABNORMAL HIGH (ref 70–99)

## 2011-07-05 LAB — PRO B NATRIURETIC PEPTIDE: Pro B Natriuretic peptide (BNP): 544.5 pg/mL — ABNORMAL HIGH (ref 0–125)

## 2011-07-05 LAB — LIPID PANEL
HDL: 37 mg/dL — ABNORMAL LOW (ref >39)
LDL Cholesterol: 100 mg/dL — ABNORMAL HIGH (ref 0–99)

## 2011-07-05 LAB — TSH: TSH: 3.628 u[IU]/mL (ref 0.350–4.500)

## 2011-07-05 NOTE — Telephone Encounter (Signed)
Dr jenkins is aware 

## 2011-07-05 NOTE — Telephone Encounter (Signed)
Pt was in yeasterday to see doctor. Yesterday afternoon pt fell on concrete he believes his heart stopped again.pt is pretty banged up just wanted to inform you of situation if you want to contact he is in [id number] at [facility].

## 2011-07-06 ENCOUNTER — Inpatient Hospital Stay (HOSPITAL_COMMUNITY): Payer: Medicare Other

## 2011-07-06 ENCOUNTER — Telehealth: Payer: Self-pay | Admitting: Internal Medicine

## 2011-07-06 DIAGNOSIS — R55 Syncope and collapse: Secondary | ICD-10-CM

## 2011-07-06 LAB — GLUCOSE, CAPILLARY: Glucose-Capillary: 250 mg/dL — ABNORMAL HIGH (ref 70–99)

## 2011-07-06 LAB — COMPREHENSIVE METABOLIC PANEL
ALT: 14 U/L (ref 0–53)
AST: 18 U/L (ref 0–37)
Albumin: 3.2 g/dL — ABNORMAL LOW (ref 3.5–5.2)
Alkaline Phosphatase: 75 U/L (ref 39–117)
Chloride: 98 mEq/L (ref 96–112)
Potassium: 4.1 mEq/L (ref 3.5–5.1)
Sodium: 133 mEq/L — ABNORMAL LOW (ref 135–145)
Total Bilirubin: 1.1 mg/dL (ref 0.3–1.2)

## 2011-07-06 LAB — PROTIME-INR
INR: 1.58 — ABNORMAL HIGH (ref 0.00–1.49)
Prothrombin Time: 19.2 seconds — ABNORMAL HIGH (ref 11.6–15.2)

## 2011-07-06 LAB — CBC
Hemoglobin: 12.2 g/dL — ABNORMAL LOW (ref 13.0–17.0)
RBC: 3.86 MIL/uL — ABNORMAL LOW (ref 4.22–5.81)

## 2011-07-06 NOTE — Telephone Encounter (Signed)
Pt called and said that he is in hopital and  is req a call back from Dr Lovell Sheehan today asap. Pt in hosptital re: fall he had. Pt feels that he needs a pace maker because pts heart had stopped and that's why he fell. Pt is wanting something to be done right away.

## 2011-07-06 NOTE — Telephone Encounter (Signed)
Pt informed that he needs to tell his hospital md have cardiology come in for a referral. Called pt and told him and he states he called cardiology and he has requested dr Myrtis Ser come in to see him.  I impressed upon him that the best way to make that happen is to tell his hospitalist have cardiology come in

## 2011-07-07 LAB — CBC
Hemoglobin: 12.4 g/dL — ABNORMAL LOW (ref 13.0–17.0)
MCH: 31.5 pg (ref 26.0–34.0)
Platelets: 147 10*3/uL — ABNORMAL LOW (ref 150–400)
RBC: 3.94 MIL/uL — ABNORMAL LOW (ref 4.22–5.81)
WBC: 10.3 10*3/uL (ref 4.0–10.5)

## 2011-07-07 LAB — BASIC METABOLIC PANEL
CO2: 26 mEq/L (ref 19–32)
Calcium: 9.2 mg/dL (ref 8.4–10.5)
Chloride: 95 mEq/L — ABNORMAL LOW (ref 96–112)
Potassium: 3.9 mEq/L (ref 3.5–5.1)
Sodium: 129 mEq/L — ABNORMAL LOW (ref 135–145)

## 2011-07-07 LAB — GLUCOSE, CAPILLARY
Glucose-Capillary: 222 mg/dL — ABNORMAL HIGH (ref 70–99)
Glucose-Capillary: 264 mg/dL — ABNORMAL HIGH (ref 70–99)

## 2011-07-07 LAB — MAGNESIUM: Magnesium: 1.3 mg/dL — ABNORMAL LOW (ref 1.5–2.5)

## 2011-07-07 LAB — DIGOXIN LEVEL: Digoxin Level: 0.5 ng/mL — ABNORMAL LOW (ref 0.8–2.0)

## 2011-07-08 DIAGNOSIS — I495 Sick sinus syndrome: Secondary | ICD-10-CM

## 2011-07-08 LAB — GLUCOSE, CAPILLARY
Glucose-Capillary: 187 mg/dL — ABNORMAL HIGH (ref 70–99)
Glucose-Capillary: 199 mg/dL — ABNORMAL HIGH (ref 70–99)

## 2011-07-08 LAB — PROTIME-INR: Prothrombin Time: 19.2 seconds — ABNORMAL HIGH (ref 11.6–15.2)

## 2011-07-09 ENCOUNTER — Inpatient Hospital Stay (HOSPITAL_COMMUNITY): Payer: Medicare Other

## 2011-07-09 LAB — GLUCOSE, CAPILLARY
Glucose-Capillary: 213 mg/dL — ABNORMAL HIGH (ref 70–99)
Glucose-Capillary: 237 mg/dL — ABNORMAL HIGH (ref 70–99)

## 2011-07-09 LAB — MAGNESIUM: Magnesium: 1.7 mg/dL (ref 1.5–2.5)

## 2011-07-09 LAB — PROTIME-INR: Prothrombin Time: 21.6 seconds — ABNORMAL HIGH (ref 11.6–15.2)

## 2011-07-10 LAB — PROTIME-INR
INR: 2.11 — ABNORMAL HIGH (ref 0.00–1.49)
Prothrombin Time: 24 seconds — ABNORMAL HIGH (ref 11.6–15.2)

## 2011-07-10 LAB — GLUCOSE, CAPILLARY
Glucose-Capillary: 183 mg/dL — ABNORMAL HIGH (ref 70–99)
Glucose-Capillary: 227 mg/dL — ABNORMAL HIGH (ref 70–99)

## 2011-07-11 DIAGNOSIS — S62109A Fracture of unspecified carpal bone, unspecified wrist, initial encounter for closed fracture: Secondary | ICD-10-CM

## 2011-07-11 DIAGNOSIS — S62309A Unspecified fracture of unspecified metacarpal bone, initial encounter for closed fracture: Secondary | ICD-10-CM

## 2011-07-11 DIAGNOSIS — M1710 Unilateral primary osteoarthritis, unspecified knee: Secondary | ICD-10-CM

## 2011-07-11 LAB — MAGNESIUM: Magnesium: 1.9 mg/dL (ref 1.5–2.5)

## 2011-07-11 LAB — PROTIME-INR: Prothrombin Time: 24.6 seconds — ABNORMAL HIGH (ref 11.6–15.2)

## 2011-07-11 LAB — BASIC METABOLIC PANEL
CO2: 25 mEq/L (ref 19–32)
Chloride: 96 mEq/L (ref 96–112)
Creatinine, Ser: 0.88 mg/dL (ref 0.50–1.35)
Glucose, Bld: 178 mg/dL — ABNORMAL HIGH (ref 70–99)

## 2011-07-11 LAB — GLUCOSE, CAPILLARY: Glucose-Capillary: 159 mg/dL — ABNORMAL HIGH (ref 70–99)

## 2011-07-11 NOTE — Consult Note (Signed)
[name], [name]              ACCOUNT NO.:  [id number]  MEDICAL RECORD NO.:  [id number]  LOCATION:  6531                         FACILITY:  [facility]  PHYSICIAN:  Dionne Ano. Merrilee Ancona, M.D.DATE OF BIRTH:  1935/01/17  DATE OF CONSULTATION: DATE OF DISCHARGE:                                CONSULTATION   HISTORY:  [name] is a 76 year old gentleman with known history of atrial fibrillation on chronic Coumadin as well as history of congestive heart failure who was standing at home suddenly lost balance and fell. He sustained injury to his left hand and left knee.  He is being worked up for multiple medical issues as his chart shows.  The patient I have reviewed these issues at length.  Present time, he complains of left knee pain and mild left hand pain, left hand is in a splint, left knee has an abrasion but is not to mobilize.  PAST MEDICAL HISTORY: 1. Congestive heart failure. 2. Diabetes. 3. Hypertension. 4. History of MRSA. 5. Atrial fibrillation. 6. Gastroesophageal reflux disease. 7. History of syncope. 8. Type 2 diabetes. 9. Hypertension. 10.CVA. 11.L4-5 fusion. 12.History of shoulder surgery. 13.Esophageal stricture. 14.UTI. 15.Gout.  ALLERGIES:  MORPHINE.  MEDICATIONS:  Reviewed in the chart.  SOCIAL HISTORY:  He is retired and married.  Former smoker. Occasionally enjoys an alcoholic beverage.  Does not smoke.  FAMILY HISTORY:  Notable for heart disease in his father.  PAST SURGICAL HISTORY:  Includes spinal fusion in his lumbar spine, rotator cuff surgery, bilateral eye surgery.  PHYSICAL EXAMINATION:  GENERAL:  Pleasant male, alert and oriented, in no acute distress. VITAL SIGNS:  Stable. EXTREMITIES:  The patient has lower extremity examination shows no signs of infection, DVT, or neurovascular compromise.  He has an abrasion over the left knee.  Mild swelling.  He has intact quadriceps and patella tendon function.  On exam ACL, PCL, lateral,  medial collateral ligaments appear to be intact abnormality.  I have reviewed this at length and the findings.  The patient has no significant hip or ankle pain in the left side. Right side is benign.  His right upper extremity has IV access.  No evidence of infection or dystrophy.  Left upper extremity is neurovascular intact.  Has a splint in place and is without signs of compartment syndrome, dystrophy or obvious compromise.  X-rays of the hand and wrist show a fifth CMC fracture.  This is an avulsion fracture relatively well maintained and should be suitable for closed treatment my estimation.  I do not have x-rays of the knee but we will order them.  IMPRESSION: 1. Status post fall with multiple medical problems and notable     orthopedic issues of a fifth CMC fracture minimally displaced     except for closed treatment. 2. Left knee strain rule out fracture.  X-rays will be obtained.  RECOMMENDATIONS:  We will get the x-rays of the left knee following this I will review this and make additional suggestions.  We are going to place him in a knee immobilizer.  We are going to make sure we have apply Neosporin and gauze sponge to the area each day.  He should use the knee  immobilizer, only when out of bed we will simply fit him for a 3-D hinged brace.  I have discussed this with him at length and the findings.  In regards to the hand, we are going to treat this with conservative care.  We will advance him to a removable splint in the near future.  I would recommend PT consult which was already on board to make sure that he has full stability capabilities.  These notes have been discussed. All questions encouraged and answered.     Dionne Ano. Amanda Pea, M.D.     [name]  D:  07/06/2011  T:  07/06/2011  Job:  [id number]  cc:   Lonia Blood, M.D. Stacie Glaze, MD  Electronically Signed by [name] M.D. on 07/11/2011 04:54:09 AM

## 2011-07-12 LAB — BASIC METABOLIC PANEL
CO2: 26 mEq/L (ref 19–32)
Calcium: 9.2 mg/dL (ref 8.4–10.5)
Glucose, Bld: 202 mg/dL — ABNORMAL HIGH (ref 70–99)
Potassium: 4.4 mEq/L (ref 3.5–5.1)
Sodium: 131 mEq/L — ABNORMAL LOW (ref 135–145)

## 2011-07-12 LAB — GLUCOSE, CAPILLARY: Glucose-Capillary: 185 mg/dL — ABNORMAL HIGH (ref 70–99)

## 2011-07-14 NOTE — Op Note (Signed)
[name], [name] NO.:  [id number]  MEDICAL RECORD NO.:  [id number]  LOCATION:  2035                         FACILITY:  [facility]  PHYSICIAN:  Hillis Range, MD       DATE OF BIRTH:  24-Apr-1935  DATE OF PROCEDURE: DATE OF DISCHARGE:                              OPERATIVE REPORT   SURGEON:  Hillis Range, MD  PREPROCEDURE DIAGNOSES: 1. Permanent atrial fibrillation. 2. Tachycardia bradycardia syndrome. 3. Syncope felt likely due to bradycardia. 4. Right bundle-branch block.  POSTPROCEDURE DIAGNOSES: 1. Permanent atrial fibrillation. 2. Tachycardia bradycardia syndrome. 3. Syncope felt likely due to bradycardia. 4. Right bundle-branch block.  PROCEDURE:  Pacemaker implantation.  INTRODUCTION:  [name] is a pleasant 76 year old gentleman with a history of permanent atrial fibrillation and right bundle-branch block. He has had multiple episodes of recurrent syncope of an unclear etiology.  He has had an aggressive workup by Dr. Graciela Husbands, which included previously an implantable loop recorder.  Unfortunately, he did not have syncope while wearing the monitor and once it was extracted he subsequently has had recurrent syncope.  He has difficulties with atrial fibrillation with rapid ventricular rates but has also been observed to have a right bundle-branch block as well as bradycardia on telemetry. Aggressive heart rate control has been limited due to concerns for a worsening bradycardia and possible recurrent syncope.  He therefore presents today for pacemaker implantation.  DESCRIPTION OF PROCEDURE:  Informed written consent was obtained and the patient was brought to the electrophysiology lab in the fasting state. He was adequately sedated with intravenous Versed as outlined in the nursing report.  The patient's left chest was prepped and draped in the usual sterile fashion by the EP lab staff.  The skin overlying the left deltopectoral region was  infiltrated with lidocaine for local analgesia. A 4-cm incision was made over the left deltopectoral region.  A left subcutaneous pacemaker pocket was fashioned using a combination of sharp and blunt dissection.  Electrocautery was required to assure hemostasis. With fluoroscopic visualization, the left axillary vein was cannulated. No contrast was required for this endeavor.  Through the left axillary vein, a [employer] Jude Medical IsoFlex model 1948 - 58 (serial number [id number]) right ventricular lead was advanced into the right ventricular apex position.  In this location, R-waves measured 9.6 mV with an impedance of 723 ohms and a threshold of 0.6 volts at 0.4 milliseconds. The lead was secured to the pectoralis fascia with #2 silk suture.  The lead was then connected to a Architectural technologist SR RF, model PM 1210 (serial number [id number]) pacemaker.  The pocket was irrigated with copious gentamicin solution.  The pacemaker was then placed into the pocket and secured to the pectoralis fascia with a single #2 silk suture.  The pocket was then closed in two layers with 2.0 Vicryl suture for the subcutaneous and subcuticular layers.  Steri-Strips and a sterile dressing were then applied.  There were no early apparent complications.  CONCLUSIONS: 1. Successful implantation of a Human resources officer RF     pacemaker. 2. No early apparent complications.     Hillis Range, MD  JA/MEDQ  D:  07/08/2011  T:  07/09/2011  Job:  [id number]  cc:   Stacie Glaze, MD [name], MD, [facility]  Electronically Signed by Hillis Range MD on 07/14/2011 09:45:12 AM

## 2011-07-18 ENCOUNTER — Encounter: Payer: Self-pay | Admitting: *Deleted

## 2011-07-20 ENCOUNTER — Encounter: Payer: Self-pay | Admitting: Gastroenterology

## 2011-07-20 ENCOUNTER — Ambulatory Visit: Payer: Medicare Other | Admitting: *Deleted

## 2011-07-20 NOTE — Discharge Summary (Signed)
  NAMEKAISER, BELLUOMINI              ACCOUNT NO.:  [id number]  MEDICAL RECORD NO.:  [id number]  LOCATION:  2035                         FACILITY:  [facility]  PHYSICIAN:  Mauro Kaufmann, MD         DATE OF BIRTH:  [birth year]  DATE OF ADMISSION:  07/04/2011 DATE OF DISCHARGE:                              DISCHARGE SUMMARY   ADDENDUM: Please see details of discharge summary as of August done with Dr. Sharl Ma.  FINAL DIAGNOSES: 1. Syncope, resolved status post pacemaker. 2. Atrial fibrillation with tachy-brady syndrome, now status post     pacemaker. 3. History of gastroesophageal reflux disease. 4. Type 2 diabetes mellitus. 5. Hypertension. 6. Fifth CMC fracture of the left hand, minimally displaced except for     the close treatment. 7. Left knee strain. 8. Hypomagnesemia, resolved. 9. Deconditioning, the patient to go to rehab.  MEDICATIONS ON DISCHARGE: 1. Cephalexin 500 mg p.o. four times a day for next 3 days. 2. Neosporin ointment with dressing to the left hand daily for 10     days. 3. Furosemide 40 mg p.o. daily. 4. Digoxin 0.125 mg p.o. daily. 5. Cardizem CD 240 mg 1 capsule daily. 6. Doxazosin 4 mg 1 tablet daily at bedtime. 7. Etodolac 300 mg 1 tablet p.o. b.i.d. 8. Lantus 25-30 units subcutaneously daily at bedtime. 9. Metformin 500 mg p.o. twice a day. 10.NovoLog 5-10 units subcutaneously three times a day with meals as     needed. 11.Omeprazole 20 mg 1 capsule twice a day. 12.Percocet 1 tablet p.o. four times daily as needed. 13.Warfarin 5 mg p.o. daily.     Mauro Kaufmann, MD     GL/MEDQ  D:  07/12/2011  T:  07/12/2011  Job:  [id number]  cc:   Stacie Glaze, MD  Electronically Signed by Mauro Kaufmann  on 07/20/2011 07:52:12 AM

## 2011-07-20 NOTE — Discharge Summary (Signed)
[name], [name]              ACCOUNT NO.:  [id number]  MEDICAL RECORD NO.:  [id number]  LOCATION:                                 FACILITY:  PHYSICIAN:  Mauro Kaufmann, MD         DATE OF BIRTH:  1935/01/31  DATE OF ADMISSION:  07/05/2011 DATE OF DISCHARGE:  07/09/2011                              DISCHARGE SUMMARY   ADMISSION DIAGNOSES: 1. Syncope. 2. Atrial fibrillation. 3. Gastroesophageal reflux disease. 4. Type 2 diabetes mellitus. 5. Hypertension. 6. Alcohol abuse.  DISCHARGE DIAGNOSES: 1. Syncope, resolved, status post pacemaker. 2. Atrial fibrillation with tachy-brady syndrome, now status post     pacemaker. 3. History of gastroesophageal reflux disease. 4. Diabetes mellitus. 5. Hypertension. 6. Fifth CMC fracture, minimally displaced except for close treatment. 7. Left knee strain. 8. Hypomagnesemia, resolved.  TESTS PERFORMED DURING THE HOSPITAL STAY: 1. CT head showed small remote lacunar infarct, chronic microvascular     white matter disease without acute intracranial findings. 2. CT of the cervical spine showed cervical spondylosis and     degenerative disk disease without discrete fracture identified. 3. Chest x-ray on August 14 showed borderline cardiomegaly, otherwise     no significant abnormality identified. 4. Hand x-ray on August 14 showed mildly displaced fracture along the     radial base of the fifth metacarpal. 5. Wrist Chest x-ray on August 14 showed mildly displaced fracture     along the radial base of the fifth metacarpal with degenerative     changes. 6. Knee x-ray showed no acute fracture or subluxation, mild     chondrocalcinosis, prepatellar soft tissue swelling. 7. Chest x-ray on August 18 showed dual pacemaker placement without     demonstrated complication.  PERTINENT LABS:  The patient had low magnesium, which was replaced.  The patient's INR on the day of discharge is 1.84.  CONSULTS OBTAINED DURING THE HOSPITAL STAY: 1.  Cardiology consultation. 2. Orthopedist consultation. 3. Intervention Cardiology consultation.  BRIEF HISTORY AND PHYSICAL:  This is a 76 year old male with a history of atrial fibrillation, on chronic Coumadin, as well as CHF, came to the hospital after the patient lost balance and fell.  The patient fell 5 years ago, at that time, he had a syncopal episode in 2008.  At that time, he had the loop recorder was placed, but did not show any abnormality.  At this time, the patient was admitted with a diagnosis of syncope and for further evaluation.  BRIEF HOSPITAL COURSE: 1. Syncope.  The patient's syncope was thought to be secondary to the     tachy-brady syndrome, Cardiology saw the patient.  The patient did     have atrial fibrillation with right bundle-branch block and had     episodes of bradycardia on telemetry, and there was concern for     worsening bradycardia and possible recurrent syncope, so he had a     pacemaker implantation done by Cardiology.  At this time, the     patient is stable and follow up with Dr. Graciela Husbands as an outpatient. 2. Status post fall with fifth CMC fracture.  The patient had carpal-     metacarpal fracture  of the fifth finger, left finger, and had that     closed.  The patient was seen by orthopedist, Dr. Amanda Pea and he     will follow the patient as an outpatient.  At this time, I was     recommended to put Neosporin ointment on the left hand with daily     dressing change and continue the patient on Keflex for 5 more days. 3. Hypomagnesemia.  The patient's magnesium was replaced.  On the day     of discharge, magnesium is 1.7. 4. Chronic AFib.  The patient takes Coumadin and his INR is 1.84.  I     have recommended the patient to continue taking his Coumadin at     home, which he checks daily.  To check his INR levels at home which     he checks daily and then let Dr. Lovell Sheehan know about the levels.  If     the levels are still low, then he needs to  increase the dose of     Coumadin as per Dr. Lovell Sheehan.  The patient will call Dr. Lovell Sheehan to     make an appointment.  Dr. Lovell Sheehan checks the INR level once a     month.  MEDICATIONS ON DISCHARGE: 1. Cephalexin 500 mg p.o. four times a day for five days. 2. Neosporin ointment one application topically daily as needed for 10     days. 3. Digoxin 0.125 mg p.o. daily. 4. Diltiazem 240 mg p.o. daily. 5. Doxazosin 4 mg tablet daily at bedtime. 6. Etodolac one tablet p.o. twice a day. 7. Furosemide 40 mg one tablet p.o. daily. 8. Lantus 25-30 units subcutaneously daily at bedtime. 9. Metformin 500 mg one tablet p.o. b.i.d. 10.NovoLog 5-10 units subcutaneously three times a day with meals as     needed. 11.Omeprazole 20 mg one capsule twice a day. 12.Percocet 10/25 one tablet p.o. four times daily as needed. 13.Warfarin 5 mg one tablet p.o. daily.     Mauro Kaufmann, MD     GL/MEDQ  D:  07/09/2011  T:  07/09/2011  Job:  [id number]  cc:   [name], MD, [facility] Dionne Ano. Amanda Pea, M.D. Dr. Lovell Sheehan  Electronically Signed by Sibyl Parr LAMA  on 07/20/2011 07:52:04 AM

## 2011-07-22 NOTE — Consult Note (Signed)
[name], [name] NO.:  [id number]  MEDICAL RECORD NO.:  [id number]  LOCATION:  6531                         FACILITY:  [facility]  PHYSICIAN:  Luis Abed, MD, FACCDATE OF BIRTH:  10/20/1935  DATE OF CONSULTATION:  07/06/2011 DATE OF DISCHARGE:                                CONSULTATION   PRIMARY CARDIOLOGIST:  [name], MD, [facility]  PRIMARY CARE Yolinda Duerr:  Stacie Glaze, MD  PATIENT PROFILE:  This is a 76 year old male with history of chronic atrial fibrillation as well as prior history of syncope in 2008, status post loop recorder which was apparently unrevealing who presented with presyncope and fall.  We have been asked to evaluate.  PROBLEM LIST: 1. Presyncope/syncope.     a.     Status post syncopal episode in 2008 resulting in right      orbital blowout fracture with resultant diplopia. 2. Chronic atrial fibrillation, on Coumadin therapy.     a.     2-D echocardiogram performed on July 05, 2011, ejection      fraction 65-70% with no wall motion.  Suggestion of significantly      elevated left ventricular filling pressures and likely restrictive      diastolic filling parameters.  Trivial aortic insufficiency, mild      mitral regurgitation.  Severely dilated left atrium.  Mild      pulmonic regurgitation. 3. Right bundle-branch block. 4. Obesity. 5. Gastroesophageal reflux disease. 6. Type 2 diabetes mellitus. 7. Hypertension. 8. Gout. 9. Esophageal stricture. 10.History of colon polyps. 11.Diverticulosis. 12.History of thrombophlebitis and cellulitis. 13.Status post laminectomy. 14.Status post lumbar fusion. 15.Status post right eye/orbit surgery. 16.History of ruptured right rectus muscle surgical repair.  ALLERGIES:  MORPHINE causes rash.  HISTORY OF PRESENT ILLNESS:  This is a 76 year old male with the above complex problem list.  The patient was in his usual state of health until few days ago when he began to experience  intermittent dizziness. On July 04, 2011, the patient was standing and felt dizzy and lost his balance.  He reached to stop himself from falling and did not lose consciousness.  He did fall and struck the ground.  He did strike the left side of his face and also noted left knee and wrist pain.  He presented to the [facility] ED where x-rays of his hand and wrist showed mildly displaced fracture along the radial base of the fifth metacarpal. Knee films showed no acute fracture or subluxation.  The patient was admitted by Internal Medicine and also seen by Orthopedics who recommended supportive care.  The patient's orthostatic vital signs were within normal limits and a 2-D echocardiogram was undertaken showing normal LV function.  On the monitor, he has remained in atrial fibrillation with rates in the 70s-80s and no significant pauses.  He does occasionally have PVCs and couplets which are apparently asymptomatic.  At he and his family's request, we have been asked to evaluate for evaluation of presyncope.  The patient would like to speak with Dr. Graciela Husbands and is worried that he needs a pacemaker.  There is no evidence of bradycardia while admitted.  CURRENT MEDICATIONS: 1. Diltiazem 240 mg  daily. 2. Folic acid 1 mg daily. 3. NovoLog sliding scale insulin. 4. Lantus 25 units at bedtime. 5. Glucophage 500 mg b.i.d. 6. Protonix 40 mg daily. 7. Vitamin B1 100 mg daily. 8. Coumadin per Pharmacy.  FAMILY HISTORY:  Father died at 23 from an MI.  Mother died of pneumonia at 60.  SOCIAL HISTORY:  The patient lives in [place] with his wife.  He has a history of remote tobacco abuse, quitting in January 2012.  He does remain active at home.  REVIEW OF SYSTEMS:  Positive for dizziness and presyncope prior to admission.  He does have some left wrist pain and knee pain.  Otherwise, all systems reviewed are negative.  PHYSICAL EXAMINATION:  VITAL SIGNS:  Temperature 98.3, heart rate  78, respirations 18, blood pressure 146/64, and pulse ox 97% on room air. ORTHOSTATIC VITAL SIGNS:  Blood pressure lying 157/73 with heart rate of 82, sitting 164/75 with heart rate of 80, standing 165/81 with heart rate of 88, weight is 91.66 kg. GENERAL:  Pleasant white male in no acute distress, awake, alert, and oriented x3.  He has a normal affect. HEENT:  Normal. NEURO:  Grossly intact, nonfocal. SKIN:  Warm, dry with abrasions under the left chain, left orbit.  He has a brace on the left knee and his left wrist is wrapped. NECK:  Supple without bruits or JVD. LUNGS:  Respirations are unlabored.  Clear to auscultation. CARDIAC:  Regular S1-S2.  No S3, S4, or murmurs. ABDOMEN:  Round, soft, nontender, and nondistended.  Bowel sounds present x4. EXTREMITIES:  Warm, dry, and pink.  No clubbing, cyanosis, or edema. Distal pulses are 1+ and equal bilaterally.  LABORATORY DATA:  Hemoglobin 12.2, hematocrit 35.6, WBC 6.8, and platelets 140.  INR 1.58.  Sodium 132, potassium 4.1, chloride 98, CO2 27, BUN 11, creatinine 0.88, and glucose 234.  Total bilirubin 1.1, alkaline phosphatase 75, AST 18, ALT 14, total protein 6.2, albumin 3.2, and calcium 8.9.  Hemoglobin A1c 8.4.  CK 53, MB 2.5, and troponin-I less than 0.30.  BNP 544.5.  Total cholesterol 164, triglycerides 134, HDL 37, and LDL 100.  TSH 3.628.  Urine drug screen was negative.  EtOH less than 11.  MRSA screen negative.  ASSESSMENT AND PLAN:  Presyncope.  The patient had dizziness for several days prior to admission which culminated in a fall on the day of admission.  He did not have any frank syncope.  We have been asked to evaluate secondary to concerns over need for a pacemaker.  The patient has not had any pauses while here.  He has had occasional premature ventricular contractions and couplets but does have normal left ventricular function.  The patient would like to speak with Dr. Graciela Husbands.  We will arrange to have  electrophysiology round on the patient in the morning.  No specific recommendations at this time.     Nicolasa Ducking, ANP   ______________________________ Luis Abed, MD, [facility]    CB/MEDQ  D:  07/06/2011  T:  07/07/2011  Job:  [id number]  Electronically Signed by Nicolasa Ducking ANP on 07/14/2011 03:03:36 PM Electronically Signed by Willa Rough MD FACC on 07/22/2011 11:35:49 AM

## 2011-07-27 ENCOUNTER — Ambulatory Visit (INDEPENDENT_AMBULATORY_CARE_PROVIDER_SITE_OTHER): Payer: Medicare Other | Admitting: *Deleted

## 2011-07-27 ENCOUNTER — Encounter: Payer: Self-pay | Admitting: Internal Medicine

## 2011-07-27 DIAGNOSIS — I495 Sick sinus syndrome: Secondary | ICD-10-CM

## 2011-07-27 LAB — PACEMAKER DEVICE OBSERVATION
BRDY-0002RV: 60 {beats}/min
DEVICE MODEL PM: 7244744

## 2011-07-27 NOTE — Progress Notes (Signed)
Wound check-PPM 

## 2011-07-28 ENCOUNTER — Other Ambulatory Visit: Payer: Self-pay | Admitting: Internal Medicine

## 2011-07-29 ENCOUNTER — Other Ambulatory Visit: Payer: Self-pay | Admitting: Internal Medicine

## 2011-08-01 ENCOUNTER — Telehealth: Payer: Self-pay | Admitting: *Deleted

## 2011-08-01 NOTE — Telephone Encounter (Signed)
Pt had pacemaker last week, and now has severe heartburn.  Does he need to call Cardiology or get advice from Dr. Lovell Sheehan?

## 2011-08-01 NOTE — Telephone Encounter (Signed)
Per. Dr. Lovell Sheehan...try Mylanta II and if this does not relieve the heartburn, let us know and also so on his double dose of Prilosec also.  May need to call Cardiologist .  Pt notified.

## 2011-08-08 ENCOUNTER — Telehealth: Payer: Self-pay | Admitting: Internal Medicine

## 2011-08-08 NOTE — Telephone Encounter (Signed)
Pt requesting a phone call refused to give a reason for the call. Please contact

## 2011-08-08 NOTE — Telephone Encounter (Signed)
Pt called back again req call back from [place]. Pt refused to give reason for the call. Pt req call back today asap.

## 2011-08-09 ENCOUNTER — Telehealth: Payer: Self-pay | Admitting: Internal Medicine

## 2011-08-09 NOTE — Telephone Encounter (Signed)
Pt wants a call back from [place] today, please.

## 2011-08-09 NOTE — Telephone Encounter (Signed)
Pt;s father very  inappropriate to staff,very demanding-per dr Lovell Sheehan- script for seroquel up front for pt and father will obtain. Per dr Lovell Sheehan have # 30 with 0 refill until she has ov with an md in facitility.

## 2011-08-09 NOTE — Telephone Encounter (Signed)
close

## 2011-08-12 ENCOUNTER — Other Ambulatory Visit: Payer: Self-pay | Admitting: Dermatology

## 2011-08-12 LAB — CBC
MCV: 95.8
Platelets: 134 — ABNORMAL LOW
Platelets: 146 — ABNORMAL LOW
RBC: 4.7
WBC: 5.1
WBC: 6.2
WBC: 8.6

## 2011-08-12 LAB — POCT CARDIAC MARKERS
Myoglobin, poc: 83.8
Troponin i, poc: <0.05

## 2011-08-12 LAB — HEMOGLOBIN A1C: Hgb A1c MFr Bld: 11.1 — ABNORMAL HIGH

## 2011-08-12 LAB — STOOL CULTURE

## 2011-08-12 LAB — I-STAT 8, (EC8 V) (CONVERTED LAB)
BUN: 30 — ABNORMAL HIGH
Bicarbonate: 17.1 — ABNORMAL LOW
HCT: 48
Operator id: 294501
pCO2, Ven: 26.1 — ABNORMAL LOW
pH, Ven: 7.425 — ABNORMAL HIGH

## 2011-08-12 LAB — BASIC METABOLIC PANEL
BUN: 10
Chloride: 103
Creatinine, Ser: 0.78
GFR calc non Af Amer: >60

## 2011-08-12 LAB — APTT: aPTT: 28

## 2011-08-12 LAB — PROTIME-INR
INR: 1.2
INR: 1.5

## 2011-08-12 LAB — CLOSTRIDIUM DIFFICILE EIA: C difficile Toxins A+B, EIA: NEGATIVE

## 2011-08-12 LAB — DIFFERENTIAL
Lymphs Abs: 1.8
Monocytes Relative: 8
Neutro Abs: 6
Neutrophils Relative %: 70

## 2011-08-12 LAB — POCT I-STAT CREATININE: Creatinine, Ser: 1.2

## 2011-08-12 LAB — GIARDIA/CRYPTOSPORIDIUM SCREEN(EIA)
Cryptosporidium Screen (EIA): NEGATIVE
Giardia Screen - EIA: NEGATIVE

## 2011-08-15 LAB — CBC
Hemoglobin: 13.2
MCHC: 35
RBC: 3.98 — ABNORMAL LOW

## 2011-08-15 LAB — PROTIME-INR
INR: 2.2 — ABNORMAL HIGH
Prothrombin Time: 19.4 — ABNORMAL HIGH

## 2011-08-22 ENCOUNTER — Ambulatory Visit: Payer: Medicare Other | Admitting: Internal Medicine

## 2011-08-22 ENCOUNTER — Ambulatory Visit (INDEPENDENT_AMBULATORY_CARE_PROVIDER_SITE_OTHER): Payer: Medicare Other | Admitting: Internal Medicine

## 2011-08-22 VITALS — BP 132/80 | HR 80 | Temp 98.2°F | Resp 18 | Ht 70.0 in | Wt 198.0 lb

## 2011-08-22 DIAGNOSIS — Z23 Encounter for immunization: Secondary | ICD-10-CM

## 2011-08-22 DIAGNOSIS — Z7901 Long term (current) use of anticoagulants: Secondary | ICD-10-CM

## 2011-08-22 DIAGNOSIS — M549 Dorsalgia, unspecified: Secondary | ICD-10-CM

## 2011-08-22 DIAGNOSIS — I4891 Unspecified atrial fibrillation: Secondary | ICD-10-CM

## 2011-08-22 LAB — POCT INR: INR: 2

## 2011-08-22 MED ORDER — OXYCODONE-ACETAMINOPHEN 10-325 MG PO TABS: 1.0000 | ORAL_TABLET | Freq: Four times a day (QID) | ORAL | Status: DC

## 2011-08-22 NOTE — Patient Instructions (Signed)
  Latest dosing instructions   Total Sun Mon Tue Wed Thu Fri Sat   35 5 mg 5 mg 5 mg 5 mg 5 mg 5 mg 5 mg    (5 mg1) (5 mg1) (5 mg1) (5 mg1) (5 mg1) (5 mg1) (5 mg1)        

## 2011-08-22 NOTE — Progress Notes (Signed)
  Subjective:    Patient ID: [name], male    DOB: [birth year], 76 y.o.   MRN: [id number]  HPI  Visit is for followup of atrial fibrillation and for chronic back pain.  We've refilled his back pain medicines per the protocol for 90 days he had a protime today of 2.5 on Coumadin 5 mg by mouth daily and will continue the Coumadin as per his current dosing regimen. AF better with the pacer Back pain intermittent and controlled with medications  Review of Systems  Constitutional: Negative for fever and fatigue.  HENT: Negative for hearing loss, congestion, neck pain and postnasal drip.   Eyes: Negative for discharge, redness and visual disturbance.  Respiratory: Negative for cough, shortness of breath and wheezing.   Cardiovascular: Negative for leg swelling.  Gastrointestinal: Negative for abdominal pain, constipation and abdominal distention.  Genitourinary: Negative for urgency and frequency.  Musculoskeletal: Negative for joint swelling and arthralgias.  Skin: Negative for color change and rash.  Neurological: Negative for weakness and light-headedness.  Hematological: Negative for adenopathy.  Psychiatric/Behavioral: Negative for behavioral problems.   Past Medical History  Diagnosis Date  . Fatigue   . Overweight   . Syncope and collapse   . GERD (gastroesophageal reflux disease)   . Right bundle branch block   . Unspecified venous (peripheral) insufficiency   . Phlebitis and thrombophlebitis of superficial vessels of lower extremities   . Cellulitis and abscess of leg, except foot   . Backache, unspecified   . Encounter for long-term (current) use of anticoagulants   . Type II or unspecified type diabetes mellitus without mention of complication, not stated as uncontrolled   . Unspecified venous (peripheral) insufficiency   . Hypertension   . Gout, unspecified   . Heart failure   . Atrial fibrillation   . Esophageal stricture   . Colon polyps   . Diverticulosis     Past Surgical History  Procedure Date  . Laminectomy   . Lumbar fusion   . Rotator cuff repair   . Ruptured rt rectus muscle   . Eye surgery     reports that he has never smoked. He does not have any smokeless tobacco history on file. He reports that he drinks alcohol. He reports that he does not use illicit drugs. family history includes Heart disease in his father and Pneumonia in his mother. Allergies  Allergen Reactions  . Morphine     REACTION: rash, irritability       Objective:   Physical Exam  Nursing note and vitals reviewed. Constitutional: He appears well-developed and well-nourished.  HENT:  Head: Normocephalic and atraumatic.  Eyes: Conjunctivae are normal. Pupils are equal, round, and reactive to light.  Neck: Normal range of motion. Neck supple.  Cardiovascular: Normal rate and regular rhythm.   Pulmonary/Chest: Effort normal and breath sounds normal.  Abdominal: Soft. Bowel sounds are normal.          Assessment & Plan:  Chronic pack pain The pt has chronic back pain and cannot have an MRI He has a pacer for AF He has an appointment with cardiology in Dec Had skin cancer removed by dermatology ( basal cell)  Will research need for CT myelogram?

## 2011-08-29 ENCOUNTER — Telehealth: Payer: Self-pay | Admitting: *Deleted

## 2011-08-29 DIAGNOSIS — M5410 Radiculopathy, site unspecified: Secondary | ICD-10-CM

## 2011-08-29 NOTE — Telephone Encounter (Signed)
Called [facility] and they suggest at ct without contrast of the ls spine--per dr Lovell Sheehan order this

## 2011-08-29 NOTE — Telephone Encounter (Signed)
Pt is calling back to see if Dr Lovell Sheehan did the research on pt's back pain and imaging ????

## 2011-09-01 ENCOUNTER — Ambulatory Visit (INDEPENDENT_AMBULATORY_CARE_PROVIDER_SITE_OTHER)
Admission: RE | Admit: 2011-09-01 | Discharge: 2011-09-01 | Disposition: A | Discharge status: Home / Self Care | Payer: Medicare Other | Source: Ambulatory Visit | Attending: Internal Medicine | Admitting: Internal Medicine

## 2011-09-01 DIAGNOSIS — IMO0002 Reserved for concepts with insufficient information to code with codable children: Secondary | ICD-10-CM

## 2011-09-01 DIAGNOSIS — M5410 Radiculopathy, site unspecified: Secondary | ICD-10-CM

## 2011-09-01 NOTE — Consult Note (Signed)
[name], [name]              ACCOUNT NO.:  [id number]  MEDICAL RECORD NO.:  [id number]  LOCATION:  6531                         FACILITY:  [facility]  PHYSICIAN:  Doylene Canning. Ladona Ridgel, MD    DATE OF BIRTH:  03/16/1935  DATE OF CONSULTATION:  07/07/2011 DATE OF DISCHARGE:                                CONSULTATION   CONSULTATION REQUESTED BY:  Luis Abed, MD, [facility]  INDICATION FOR CONSULTATION:  Evaluation of syncope.  HISTORY OF PRESENT ILLNESS:  The patient is a 76 year old man with a history of recurrent syncope in 2008.  At that time, the etiology was unclear and he fractured his eye.  He underwent insertion of implantable loop recorder.  It was removed after 2 years with no diagnosis.  The patient was in his usual state of health when he presented several days ago after having a sudden syncopal episode where he fractured his hand. He has had no recurrent spells.  He has been on cardiac monitoring, has chronic right bundle-branch block, but no slow ventricular rates and no particular fast ventricular rates.  The patient does not feel palpitations.  He did not have any warning prior to the current episode. He denies anginal symptoms.  PAST MEDICAL HISTORY:  Notable for hypertension, remote stroke, diabetes, history of MRSA x2, also history of shoulder surgery and history of gout.  SOCIAL HISTORY:  The patient is married and retired.  He is a former tobacco user.  He drinks alcohol rarely.  FAMILY HISTORY:  He has no premature coronary disease, though his father did have heart disease later in life.  PAST SURGICAL HISTORY:  Notable for spinal fusion, rotator cuff surgery, and eye surgery.  REVIEW OF SYSTEMS:  All systems reviewed and negative except as noted in the HPI.  Of note, the patient did fracture his hand with his current syncopal episode.  PHYSICAL EXAMINATION:  GENERAL:  He is a pleasant, well-appearing 76- year-old man in no acute distress. VITAL SIGNS:   Blood pressure was 126/74, the pulse was 80 and irregular, respirations 18, temperature 98. HEENT:  Normocephalic and atraumatic.  Pupils equal and round. Oropharynx moist.  Sclerae anicteric. NECK:  Revealed no jugular venous distention.  There is no thyromegaly. LUNGS:  Clear bilaterally to auscultation.  No wheezes, rales, or rhonchi are present. CARDIOVASCULAR:  Irregularly irregular rhythm, normal S1 and S2. ABDOMINAL:  Soft, nontender, nondistended.  There is no organomegaly. EXTREMITIES:  Demonstrated cast in his left arm with a bandage present. NEUROLOGIC:  Alert and oriented x3.  His cranial nerves intact. Strength is 5/5 and symmetric.  The EKG demonstrates atrial fibrillation with a controlled ventricular response and right bundle-branch block QRS duration 130 milliseconds.  IMPRESSION: 1. Unexplained recurrent syncope. 2. Chronic atrial fibrillation. 3. Right bundle-branch block. 4. Hypertension discussion.  There multiple unexplained issues with     [name].  I discussed treatment options with the patient.  One     option will be to proceed with pacemaker insertion.  Second option     would be period of watchful waiting.  Third option would be to     repeat his loop recorder.  At this point, both  watchful waiting and     insertion of the pacemaker were discussed in detail.  He is     considering his options.  My inclination would be that watchful     waiting would be certainly a very reasonable that he is gone 4     years between syncopal episodes.  He would like to discuss all of     these issues with his primary cardiologist, Dr. Graciela Husbands as well.     Doylene Canning. Ladona Ridgel, MD     GWT/MEDQ  D:  07/07/2011  T:  07/07/2011  Job:  [id number]  cc:   Stacie Glaze, MD  Electronically Signed by Lewayne Bunting MD on 09/01/2011 06:39:21 PM

## 2011-09-02 ENCOUNTER — Telehealth: Payer: Self-pay | Admitting: Internal Medicine

## 2011-09-02 DIAGNOSIS — M48061 Spinal stenosis, lumbar region without neurogenic claudication: Secondary | ICD-10-CM

## 2011-09-02 NOTE — Telephone Encounter (Signed)
Referral sent to terri 

## 2011-09-02 NOTE — Telephone Encounter (Signed)
Stenosis and worsening disc disease- to neurosurgeon and pt has been to dr Tinnie Gens and wants a different nuerosurgeon.will send referral

## 2011-09-02 NOTE — Telephone Encounter (Signed)
Requesting ct scan results from yesterday. Thanks.
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
## 2011-09-14 ENCOUNTER — Telehealth: Payer: Self-pay | Admitting: *Deleted

## 2011-09-14 NOTE — Telephone Encounter (Signed)
Pt needs back injections. Will have to discontinue coumadin before. Please advise

## 2011-09-15 ENCOUNTER — Other Ambulatory Visit: Payer: Self-pay | Admitting: *Deleted

## 2011-09-15 NOTE — Telephone Encounter (Signed)
Per dr Kalman Shan coumadin 5 days prior to injection and 2 days after- pt to take lovenox 80 on those days without coumadin. Pt is having 2 injection 1 week apart.  Therefor pt will need to stay on lovenox during that week also and 2 days after 2nd injection.  Will need #15 injections.,5 prior, 1 the day of, 7  during the week and 2 day after

## 2011-09-28 ENCOUNTER — Other Ambulatory Visit: Payer: Self-pay | Admitting: *Deleted

## 2011-09-28 MED ORDER — ENOXAPARIN SODIUM 80 MG/0.8ML ~~LOC~~ SOLN: SUBCUTANEOUS | Status: DC

## 2011-09-29 ENCOUNTER — Telehealth: Payer: Self-pay | Admitting: Internal Medicine

## 2011-09-29 NOTE — Telephone Encounter (Signed)
Pt would like bonnie to return his call concerning med

## 2011-09-29 NOTE — Telephone Encounter (Signed)
Pt called- states lovenox would cost $117.oo and his back has felt better for several weeks now and he has decided to cancel everything and not have it done. He states he will call md an d cancel.Marland KitchenFYI

## 2011-09-30 NOTE — Telephone Encounter (Signed)
Dr Stephanie Coup is aware

## 2011-10-04 ENCOUNTER — Telehealth: Payer: Self-pay | Admitting: Internal Medicine

## 2011-10-04 NOTE — Telephone Encounter (Signed)
Pt would like bonnie to return his call concerning back brace.

## 2011-10-04 NOTE — Telephone Encounter (Signed)
Armed forces technical officer for back brae- pt informed if he needs back brace dr Gerlene Fee will order

## 2011-10-14 ENCOUNTER — Other Ambulatory Visit: Payer: Self-pay | Admitting: Internal Medicine

## 2011-10-31 ENCOUNTER — Ambulatory Visit (INDEPENDENT_AMBULATORY_CARE_PROVIDER_SITE_OTHER): Payer: Medicare Other | Admitting: Internal Medicine

## 2011-10-31 ENCOUNTER — Encounter: Payer: Self-pay | Admitting: Internal Medicine

## 2011-10-31 DIAGNOSIS — I4891 Unspecified atrial fibrillation: Secondary | ICD-10-CM

## 2011-10-31 DIAGNOSIS — I495 Sick sinus syndrome: Secondary | ICD-10-CM

## 2011-10-31 LAB — PACEMAKER DEVICE OBSERVATION
BRDY-0002RV: 60 {beats}/min
BRDY-0005RV: 50 {beats}/min
DEVICE MODEL PM: 7244744

## 2011-10-31 NOTE — Progress Notes (Signed)
PCP:  Carrie Mew, MD  The patient presents today for routine electrophysiology followup.  Since recently having his pacemaker implanted, the patient reports doing very well.  He remains active despite his age.  His PPM has healed nicely.  Today, he denies symptoms of palpitations, chest pain, shortness of breath, orthopnea, PND, lower extremity edema, dizziness, presyncope, syncope, or neurologic sequela.  The patient feels that he is tolerating medications without difficulties and is otherwise without complaint today.   Past Medical History  Diagnosis Date  . Fatigue   . Overweight   . Syncope and collapse   . GERD (gastroesophageal reflux disease)   . Right bundle branch block   . Unspecified venous (peripheral) insufficiency   . Phlebitis and thrombophlebitis of superficial vessels of lower extremities   . Cellulitis and abscess of leg, except foot   . Backache, unspecified   . Encounter for long-term (current) use of anticoagulants   . Type II or unspecified type diabetes mellitus without mention of complication, not stated as uncontrolled   . Unspecified venous (peripheral) insufficiency   . Hypertension   . Gout, unspecified   . Heart failure   . Atrial fibrillation     permanent  . Esophageal stricture   . Colon polyps   . Diverticulosis   . Tachycardia-bradycardia     s/p PPM   Past Surgical History  Procedure Date  . Laminectomy   . Lumbar fusion   . Rotator cuff repair   . Ruptured rt rectus muscle   . Eye surgery   . Pacemaker insertion 8/12    SJM by Dr Johney Frame for tachy/brady syndrome    Current Outpatient Prescriptions  Medication Sig Dispense Refill  . CARDIZEM CD 240 MG 24 hr capsule TAKE (1) CAPSULE DAILY.  30 each  6  . colchicine 0.6 MG tablet Take 0.6 mg by mouth as needed.        . enoxaparin (LOVENOX) 80 MG/0.8ML SOLN injection Give as instructed  7 Syringe  0  . GLUCOPHAGE 500 MG tablet TAKE 1 TABLET TWICE DAILY.  180 each  0  . Insulin  Syringe-Needle U-100 (BD INSULIN SYRINGE ULTRAFINE) 31G X 5/16" 0.3 ML MISC by Does not apply route. As directed       . Lancets MISC As directed      . LANOXIN 0.125 MG tablet TAKE 1 TABLET ONCE DAILY.  90 each  3  . LANTUS 100 UNIT/ML injection INJECT 25-30 UNITS AT    BEDTIME AS DIRECTED.  10 mL  11  . LASIX 40 MG tablet TAKE 1 OR 2 TABLETS      DAILY.  60 each  6  . LODINE 300 MG capsule TAKE (1) CAPSULE TWICE DAILY.  180 each  0  . NOVOLOG 100 UNIT/ML injection SLIDING SCALE: >300:10U, 200-300: 5U, <200:0U.  10 mL  5  . omeprazole (PRILOSEC) 20 MG capsule TAKE (1) CAPSULE TWICE   DAILY.  60 capsule  11  . oxyCODONE-acetaminophen (PERCOCET) 10-325 MG per tablet Take 1 tablet by mouth 4 (four) times daily.  120 tablet  0  . warfarin (COUMADIN) 5 MG tablet Take 5 mg by mouth daily.        Marland Kitchen DISCONTD: Insulin Glargine (LANTUS SOLOSTAR Megargel) Inject 25-30 Units into the skin at bedtime.          Allergies  Allergen Reactions  . Morphine     REACTION: rash, irritability    History   Social History  .  Marital Status: Married    Spouse Name: [name]    Number of Children: [name]  . Years of Education: [name]   Occupational History  . retired    Social History Main Topics  . Smoking status: Never Smoker   . Smokeless tobacco: Not on file  . Alcohol Use: Yes  . Drug Use: No  . Sexually Active: Yes   Other Topics Concern  . Not on file   Social History Narrative  . No narrative on file    Family History  Problem Relation Age of Onset  . Pneumonia Mother   . Heart disease Father     Physical Exam: Filed Vitals:   10/31/11 1242  BP: 128/62  Pulse: 88  Height: 5\' 10"  (1.778 m)  Weight: 191 lb (86.637 kg)    GEN- The patient is well appearing, alert and oriented x 3 today.   Head- normocephalic, atraumatic Eyes-  Sclera clear, conjunctiva pink Ears- hearing intact Oropharynx- clear Neck- supple, no JVP Lymph- no cervical lymphadenopathy Lungs- Clear to ausculation  bilaterally, normal work of breathing Chest- pacemaker pocket is well healed Heart- irregular rate and rhythm,   GI- soft, NT, ND, + BS Extremities- no clubbing, cyanosis, trace edema   Pacemaker interrogation- reviewed in detail today,  See PACEART report  Assessment and Plan:

## 2011-10-31 NOTE — Assessment & Plan Note (Signed)
Stable No change required today  

## 2011-10-31 NOTE — Patient Instructions (Signed)
Your physician wants you to follow-up in: August, 2013 with Dr. Graciela Husbands.   You will receive a reminder letter in the mail two months in advance. If you don't receive a letter, please call our office to schedule the follow-up appointment.   Your physician recommends that you continue on your current medications as directed. Please refer to the Current Medication list given to you today.

## 2011-10-31 NOTE — Assessment & Plan Note (Signed)
Normal pacemaker function See Arita Miss Art report No changes today  Return to see Dr Graciela Husbands 8/13

## 2011-11-21 ENCOUNTER — Encounter: Payer: Self-pay | Admitting: Internal Medicine

## 2011-11-23 ENCOUNTER — Ambulatory Visit: Payer: Medicare Other | Admitting: Internal Medicine

## 2011-12-01 ENCOUNTER — Telehealth: Payer: Self-pay | Admitting: Internal Medicine

## 2011-12-01 NOTE — Telephone Encounter (Signed)
Pt has an absess and would need some dental work done soon. Was told that he would need to go off coumadin. Please advise and return his call. He sees Dr Lovell Sheehan on 12-09-2011. Thanks.

## 2011-12-02 NOTE — Telephone Encounter (Signed)
Per dr Lovell Sheehan- may stop coumadin 3 days prior to dental work

## 2011-12-02 NOTE — Telephone Encounter (Signed)
Left detailed message on pt's voice mail

## 2011-12-09 ENCOUNTER — Ambulatory Visit: Payer: Medicare Other | Admitting: Internal Medicine

## 2011-12-13 ENCOUNTER — Telehealth: Payer: Self-pay | Admitting: Family Medicine

## 2011-12-13 NOTE — Telephone Encounter (Signed)
Called pt and Left message on machine

## 2011-12-13 NOTE — Telephone Encounter (Signed)
Office Message from Date: 12/12/2011 12:00:00 AM Time of Call: [phone] Faxed To: [facility] CallerElric Tirado Number: [phone] Facility: home Patient: [name], [name] DOB: 1935-01-23 Phone: [phone] Provider: Darryll Capers Message: Pt would like Kendal Hymen to please call him in the morning. Regarding Appointment: Yes Appt Date: Appt Time: Unknown Provider: Reason: Details: Outcome: Message Taken by: Catheryn Bacon, CSR [facility]  [address] [place], [place] [address]  P: [phone]  F: [phone]-

## 2011-12-28 ENCOUNTER — Telehealth: Payer: Self-pay | Admitting: Internal Medicine

## 2011-12-28 NOTE — Telephone Encounter (Signed)
Can you call him please

## 2011-12-28 NOTE — Telephone Encounter (Signed)
Per dr Lovell Sheehan- the only thins cheaper is vicodan 10 4 times a day and he has tried that in the past and didn't like it. what does he want to do?

## 2011-12-28 NOTE — Telephone Encounter (Signed)
Patient requesting a call back

## 2011-12-28 NOTE — Telephone Encounter (Addendum)
Pt said he called [facility] to have Dr. Lovell Sheehan change his Oxycodone to something less expensive.  He is going to be giving out of meds in a day or two, and has been trying to get in touch with [place] for a while.

## 2011-12-28 NOTE — Telephone Encounter (Signed)
Pt states he will continue with the Oxycodone.

## 2012-01-06 ENCOUNTER — Other Ambulatory Visit: Payer: Self-pay | Admitting: Internal Medicine

## 2012-01-13 ENCOUNTER — Ambulatory Visit: Payer: Medicare Other | Admitting: Internal Medicine

## 2012-01-20 ENCOUNTER — Ambulatory Visit (INDEPENDENT_AMBULATORY_CARE_PROVIDER_SITE_OTHER): Payer: Medicare Other | Admitting: Internal Medicine

## 2012-01-20 VITALS — BP 146/84 | HR 76 | Temp 98.2°F | Resp 16 | Ht 70.0 in | Wt 194.0 lb

## 2012-01-20 DIAGNOSIS — G894 Chronic pain syndrome: Secondary | ICD-10-CM

## 2012-01-20 DIAGNOSIS — E785 Hyperlipidemia, unspecified: Secondary | ICD-10-CM

## 2012-01-20 DIAGNOSIS — I4891 Unspecified atrial fibrillation: Secondary | ICD-10-CM

## 2012-01-20 DIAGNOSIS — E669 Obesity, unspecified: Secondary | ICD-10-CM

## 2012-01-20 DIAGNOSIS — T887XXA Unspecified adverse effect of drug or medicament, initial encounter: Secondary | ICD-10-CM

## 2012-01-20 LAB — HEPATIC FUNCTION PANEL
ALT: 19 U/L (ref 0–53)
Albumin: 3.8 g/dL (ref 3.5–5.2)
Alkaline Phosphatase: 79 U/L (ref 39–117)
Total Protein: 6.8 g/dL (ref 6.0–8.3)

## 2012-01-20 LAB — LIPID PANEL
Cholesterol: 197 mg/dL (ref 0–200)
Total CHOL/HDL Ratio: 5
Triglycerides: 251 mg/dL — ABNORMAL HIGH (ref 0.0–149.0)
VLDL: 50.2 mg/dL — ABNORMAL HIGH (ref 0.0–40.0)

## 2012-01-20 LAB — CBC WITH DIFFERENTIAL/PLATELET
Basophils Absolute: 0 10*3/uL (ref 0.0–0.1)
Eosinophils Relative: 1.5 % (ref 0.0–5.0)
Monocytes Relative: 5.7 % (ref 3.0–12.0)
Neutrophils Relative %: 67.3 % (ref 43.0–77.0)
Platelets: 161 10*3/uL (ref 150.0–400.0)
WBC: 7.8 10*3/uL (ref 4.5–10.5)

## 2012-01-20 LAB — HEMOGLOBIN A1C: Hgb A1c MFr Bld: 8.7 % — ABNORMAL HIGH (ref 4.6–6.5)

## 2012-01-20 NOTE — Progress Notes (Signed)
Subjective:    Patient ID: [name], male    DOB: September 07, 1935, 77 y.o.   MRN: [id number]  HPI  The patient is a 77 year old white male who presents for followup of April fibrillation hypertension diabetes and chronic pain.  We first addressed the atrial fibrillation, his rates have been stable he denies any chest pain palpitations or syncope.  His blood pressure is well controlled on his current medications. He states that his blood sugars have been within range but he is not a good historian. Last hemoglobin A1c was in August  He has chronic pain complicated by a history of alcohol abuse and a danger of addictive behavior  Review of Systems  Constitutional: Negative for fever and fatigue.  HENT: Negative for hearing loss, congestion, neck pain and postnasal drip.   Eyes: Negative for discharge, redness and visual disturbance.  Respiratory: Negative for cough, shortness of breath and wheezing.   Cardiovascular: Negative for leg swelling.  Gastrointestinal: Negative for abdominal pain, constipation and abdominal distention.  Genitourinary: Negative for urgency and frequency.  Musculoskeletal: Negative for joint swelling and arthralgias.  Skin: Negative for color change and rash.  Neurological: Negative for weakness and light-headedness.  Hematological: Negative for adenopathy.  Psychiatric/Behavioral: Negative for behavioral problems.   Past Medical History  Diagnosis Date  . Fatigue   . Overweight   . Syncope and collapse   . GERD (gastroesophageal reflux disease)   . Right bundle branch block   . Unspecified venous (peripheral) insufficiency   . Phlebitis and thrombophlebitis of superficial vessels of lower extremities   . Cellulitis and abscess of leg, except foot   . Backache, unspecified   . Encounter for long-term (current) use of anticoagulants   . Type II or unspecified type diabetes mellitus without mention of complication, not stated as uncontrolled   .  Unspecified venous (peripheral) insufficiency   . Hypertension   . Gout, unspecified   . Heart failure   . Atrial fibrillation     permanent  . Esophageal stricture   . Colon polyps   . Diverticulosis   . Tachycardia-bradycardia     s/p PPM    History   Social History  . Marital Status: Married    Spouse Name: [name]    Number of Children: [name]  . Years of Education: [name]   Occupational History  . retired    Social History Main Topics  . Smoking status: Never Smoker   . Smokeless tobacco: Not on file  . Alcohol Use: Yes  . Drug Use: No  . Sexually Active: Yes   Other Topics Concern  . Not on file   Social History Narrative  . No narrative on file    Past Surgical History  Procedure Date  . Laminectomy   . Lumbar fusion   . Rotator cuff repair   . Ruptured rt rectus muscle   . Eye surgery   . Pacemaker insertion 8/12    SJM by Dr Johney Frame for tachy/brady syndrome    Family History  Problem Relation Age of Onset  . Pneumonia Mother   . Heart disease Father     Allergies  Allergen Reactions  . Morphine     REACTION: rash, irritability    Current Outpatient Prescriptions on File Prior to Visit  Medication Sig Dispense Refill  . CARDIZEM CD 240 MG 24 hr capsule TAKE (1) CAPSULE DAILY.  30 each  5  . colchicine 0.6 MG tablet Take 0.6 mg by mouth as  needed.        . enoxaparin (LOVENOX) 80 MG/0.8ML SOLN injection Give as instructed  7 Syringe  0  . GLUCOPHAGE 500 MG tablet TAKE 1 TABLET TWICE DAILY.  180 each  0  . Insulin Syringe-Needle U-100 (BD INSULIN SYRINGE ULTRAFINE) 31G X 5/16" 0.3 ML MISC by Does not apply route. As directed       . Lancets MISC As directed      . LANOXIN 0.125 MG tablet TAKE 1 TABLET ONCE DAILY.  90 each  3  . LANTUS 100 UNIT/ML injection INJECT 25-30 UNITS AT    BEDTIME AS DIRECTED.  10 mL  11  . LASIX 40 MG tablet TAKE 1 OR 2 TABLETS      DAILY.  60 each  6  . LODINE 300 MG capsule TAKE (1) CAPSULE TWICE DAILY.  180 each  0  .  NOVOLOG 100 UNIT/ML injection SLIDING SCALE: >300:10U, 200-300: 5U, <200:0U.  10 mL  5  . omeprazole (PRILOSEC) 20 MG capsule TAKE (1) CAPSULE TWICE   DAILY.  60 capsule  11  . COUMADIN 5 MG tablet TAKE 1 TABLET ONCE DAILY.  90 each  3    BP 146/84  Pulse 76  Temp 98.2 F (36.8 C)  Resp 16  Ht 5\' 10"  (1.778 m)  Wt 194 lb (87.998 kg)  BMI 27.84 kg/m2       Objective:   Physical Exam  Nursing note and vitals reviewed. Constitutional: He appears well-developed.       Obese white male in no apparent distress  HENT:  Head: Normocephalic and atraumatic.  Eyes: Conjunctivae are normal. Pupils are equal, round, and reactive to light.  Neck: Normal range of motion. Neck supple.  Cardiovascular:  Murmur heard.      Atrial fibrillation with controlled rate  Pulmonary/Chest: Effort normal and breath sounds normal.  Abdominal: Soft. Bowel sounds are normal.          Assessment & Plan:  Chronic pain: We have discussed his pain medications and have agreed that we will keep them exactly where they are without adjustment. He is using Percocet because states that he cannot afford long-acting preparations we have tried in the past. We have agreed to continue the Percocet at the current level without titration and have warned him of the possibility of dependence and developing toleration to the medication. If this occurs she will need to be referred to a pain center for more complete monitoring of his medication use.  His diabetes is in unknown state of control and an A1c will be drawn today the patient states that his CBGs are normal but he is a poor historian.  He is currently compliant with his Coumadin therapy and full monitor a pro time today however he has been intermittently noncompliant with his pro time checks and we urged him to take a more active role in monitoring his Coumadin.  Blood pressure and atrial fib appear stable

## 2012-01-20 NOTE — Patient Instructions (Addendum)
Foot soaks with  One cup of white vinegar and one gallon of warm water    Latest dosing instructions   Total Sun Mon Tue Wed Thu Fri Sat   35 5 mg 5 mg 5 mg 5 mg 5 mg 5 mg 5 mg    (5 mg1) (5 mg1) (5 mg1) (5 mg1) (5 mg1) (5 mg1) (5 mg1)

## 2012-01-26 ENCOUNTER — Other Ambulatory Visit: Payer: Self-pay | Admitting: Internal Medicine

## 2012-01-31 ENCOUNTER — Telehealth: Payer: Self-pay | Admitting: Internal Medicine

## 2012-01-31 NOTE — Telephone Encounter (Signed)
Pt called and is requesting to get lab results. Pt would also like to get a full copy of the labs.  Also pt would like to discuss Aricept with Dr Lovell Sheehan and get his opinion on this med.

## 2012-01-31 NOTE — Telephone Encounter (Signed)
Pt informed and labs out front

## 2012-02-06 ENCOUNTER — Telehealth: Payer: Self-pay | Admitting: Internal Medicine

## 2012-02-06 MED ORDER — OXYCODONE-ACETAMINOPHEN 10-325 MG PO TABS: 1.0000 | ORAL_TABLET | Freq: Four times a day (QID) | ORAL | Status: DC

## 2012-02-06 NOTE — Telephone Encounter (Signed)
PT REQUESTING REFILL  oxyCODONE-acetaminophen (PERCOCET) 10-325 MG per tablet    PT REQUESTING PHONE CALL HE HAS QUESTIONS REGARDING MEDICATIONS ALSO REQUESTING TO BE CONTACTED WHEN SCRIPT IS READY

## 2012-02-06 NOTE — Telephone Encounter (Signed)
Will be ready for pick up at 10am tom orrow-pt informed

## 2012-02-15 ENCOUNTER — Encounter: Payer: Self-pay | Admitting: Internal Medicine

## 2012-03-11 ENCOUNTER — Other Ambulatory Visit: Payer: Self-pay | Admitting: Internal Medicine

## 2012-03-13 ENCOUNTER — Other Ambulatory Visit: Payer: Self-pay | Admitting: Internal Medicine

## 2012-03-15 ENCOUNTER — Other Ambulatory Visit: Payer: Self-pay | Admitting: Internal Medicine

## 2012-03-21 ENCOUNTER — Encounter: Payer: Medicare Other | Admitting: Internal Medicine

## 2012-03-22 ENCOUNTER — Other Ambulatory Visit: Payer: Self-pay | Admitting: Dermatology

## 2012-04-02 ENCOUNTER — Encounter: Payer: Self-pay | Admitting: Internal Medicine

## 2012-04-02 ENCOUNTER — Ambulatory Visit (INDEPENDENT_AMBULATORY_CARE_PROVIDER_SITE_OTHER): Payer: Medicare Other | Admitting: Internal Medicine

## 2012-04-02 VITALS — BP 140/80 | HR 72 | Temp 98.2°F | Resp 16 | Ht 70.5 in | Wt 195.0 lb

## 2012-04-02 DIAGNOSIS — T50901A Poisoning by unspecified drugs, medicaments and biological substances, accidental (unintentional), initial encounter: Secondary | ICD-10-CM

## 2012-04-02 DIAGNOSIS — G8929 Other chronic pain: Secondary | ICD-10-CM

## 2012-04-02 DIAGNOSIS — I4891 Unspecified atrial fibrillation: Secondary | ICD-10-CM

## 2012-04-02 DIAGNOSIS — T50904A Poisoning by unspecified drugs, medicaments and biological substances, undetermined, initial encounter: Secondary | ICD-10-CM

## 2012-04-02 DIAGNOSIS — F1010 Alcohol abuse, uncomplicated: Secondary | ICD-10-CM | POA: Insufficient documentation

## 2012-04-02 DIAGNOSIS — E119 Type 2 diabetes mellitus without complications: Secondary | ICD-10-CM

## 2012-04-02 DIAGNOSIS — I10 Essential (primary) hypertension: Secondary | ICD-10-CM

## 2012-04-02 LAB — POCT INR: INR: 1.5

## 2012-04-02 MED ORDER — OXYCODONE-ACETAMINOPHEN 10-325 MG PO TABS: 1.0000 | ORAL_TABLET | Freq: Four times a day (QID) | ORAL | Status: DC

## 2012-04-02 NOTE — Patient Instructions (Addendum)
The use of the alcohol has endangered the coumadin control and the cholesterol As well as prevented good diabetic control   "look up a "paleo" diet  "raw milk  Cheeses"  Grass feed beef Avoid gluten     Latest dosing instructions   Total Sun Mon Tue Wed Thu Fri Sat   35 5 mg 5 mg 5 mg 5 mg 5 mg 5 mg 5 mg    (5 mg1) (5 mg1) (5 mg1) (5 mg1) (5 mg1) (5 mg1) (5 mg1)

## 2012-04-23 ENCOUNTER — Other Ambulatory Visit: Payer: Self-pay | Admitting: *Deleted

## 2012-04-23 ENCOUNTER — Other Ambulatory Visit: Payer: Self-pay | Admitting: Internal Medicine

## 2012-04-23 MED ORDER — ETODOLAC 300 MG PO CAPS: 300.0000 mg | ORAL_CAPSULE | Freq: Two times a day (BID) | ORAL | Status: DC

## 2012-05-16 ENCOUNTER — Other Ambulatory Visit: Payer: Self-pay | Admitting: Dermatology

## 2012-05-17 ENCOUNTER — Other Ambulatory Visit: Payer: Self-pay | Admitting: Internal Medicine

## 2012-05-17 ENCOUNTER — Telehealth: Payer: Self-pay | Admitting: Family

## 2012-05-17 NOTE — Telephone Encounter (Signed)
Patient needs to have INR checked asap. Overdue.

## 2012-05-21 NOTE — Telephone Encounter (Signed)
Please schedule patient. Thanks.

## 2012-05-31 ENCOUNTER — Emergency Department (HOSPITAL_COMMUNITY)
Admission: EM | Admit: 2012-05-31 | Discharge: 2012-06-01 | Disposition: A | Discharge status: Home / Self Care | Payer: Medicare Other | Attending: Emergency Medicine | Admitting: Emergency Medicine

## 2012-05-31 ENCOUNTER — Encounter (HOSPITAL_COMMUNITY): Payer: Self-pay | Admitting: Emergency Medicine

## 2012-05-31 DIAGNOSIS — Z789 Other specified health status: Secondary | ICD-10-CM

## 2012-05-31 DIAGNOSIS — K219 Gastro-esophageal reflux disease without esophagitis: Secondary | ICD-10-CM | POA: Insufficient documentation

## 2012-05-31 DIAGNOSIS — Z794 Long term (current) use of insulin: Secondary | ICD-10-CM | POA: Insufficient documentation

## 2012-05-31 DIAGNOSIS — R739 Hyperglycemia, unspecified: Secondary | ICD-10-CM

## 2012-05-31 DIAGNOSIS — Z79899 Other long term (current) drug therapy: Secondary | ICD-10-CM | POA: Insufficient documentation

## 2012-05-31 DIAGNOSIS — E119 Type 2 diabetes mellitus without complications: Secondary | ICD-10-CM | POA: Insufficient documentation

## 2012-05-31 DIAGNOSIS — I10 Essential (primary) hypertension: Secondary | ICD-10-CM | POA: Insufficient documentation

## 2012-05-31 LAB — GLUCOSE, CAPILLARY

## 2012-05-31 MED ORDER — INSULIN ASPART 100 UNIT/ML ~~LOC~~ SOLN
10.0000 [IU] | Freq: Once | SUBCUTANEOUS | Status: AC
  Administered 2012-06-01: 10 [IU] via INTRAVENOUS

## 2012-05-31 MED ORDER — INSULIN ASPART 100 UNIT/ML ~~LOC~~ SOLN
15.0000 [IU] | Freq: Once | SUBCUTANEOUS | Status: DC
  Filled 2012-05-31: qty 1

## 2012-05-31 MED ORDER — INSULIN GLARGINE 100 UNIT/ML ~~LOC~~ SOLN
30.0000 [IU] | Freq: Once | SUBCUTANEOUS | Status: AC
  Administered 2012-06-01: 30 [IU] via SUBCUTANEOUS
  Filled 2012-05-31: qty 1

## 2012-05-31 NOTE — ED Notes (Signed)
Pt denies any complaints states he needs his insulin. Lantus 30u and Novolog 15u

## 2012-05-31 NOTE — ED Notes (Signed)
Patient here for insulin for his hyperglycemia.  Patient states he had company this evening and didn't get to the drug store in time.

## 2012-06-01 ENCOUNTER — Other Ambulatory Visit: Payer: Self-pay | Admitting: Dermatology

## 2012-06-01 ENCOUNTER — Other Ambulatory Visit: Payer: Self-pay | Admitting: Internal Medicine

## 2012-06-01 NOTE — ED Provider Notes (Signed)
Medical screening examination/treatment/procedure(s) were performed by non-physician practitioner and as supervising physician I was immediately available for consultation/collaboration.   Doninique Lwin, MD 06/01/12 0654 

## 2012-06-01 NOTE — ED Provider Notes (Signed)
History     CSN: [id number]  Arrival date & time 05/31/12  2253   First MD Initiated Contact with Patient 05/31/12 2332      Chief Complaint  Patient presents with  . Hyperglycemia    (Consider location/radiation/quality/duration/timing/severity/associated sxs/prior treatment) HPI Presents emergency department because he ran out of insulin and his company stably tonight and was unable to get his medication filled.  Patient, states that he normally takes NovoLog and Lantus in the evening.  Patient, states he is having no difficulties, but does need insulin and that is what brought him to the hospital.  Patient denies, weakness, nausea, vomiting, blurred vision or gait disturbance Past Medical History  Diagnosis Date  . Fatigue   . Overweight   . Syncope and collapse   . GERD (gastroesophageal reflux disease)   . Right bundle branch block   . Unspecified venous (peripheral) insufficiency   . Phlebitis and thrombophlebitis of superficial vessels of lower extremities   . Cellulitis and abscess of leg, except foot   . Backache, unspecified   . Encounter for long-term (current) use of anticoagulants   . Type II or unspecified type diabetes mellitus without mention of complication, not stated as uncontrolled   . Unspecified venous (peripheral) insufficiency   . Hypertension   . Gout, unspecified   . Heart failure   . Atrial fibrillation     permanent  . Esophageal stricture   . Colon polyps   . Diverticulosis   . Tachycardia-bradycardia     s/p PPM    Past Surgical History  Procedure Date  . Laminectomy   . Lumbar fusion   . Rotator cuff repair   . Ruptured rt rectus muscle   . Eye surgery   . Pacemaker insertion 8/12    SJM by Dr Johney Frame for tachy/brady syndrome    Family History  Problem Relation Age of Onset  . Pneumonia Mother   . Heart disease Father     History  Substance Use Topics  . Smoking status: Never Smoker   . Smokeless tobacco: Not on file  .  Alcohol Use: Yes      Review of Systems All other systems negative except as documented in the HPI. All pertinent positives and negatives as reviewed in the HPI.  Allergies  Morphine  Home Medications   Current Outpatient Rx  Name Route Sig Dispense Refill  . BD INSULIN SYRINGE ULTRAFINE 31G X 5/16" 0.3 ML MISC  USE AS DIRECTED. 100 each PRN  . DIGOXIN 0.125 MG PO TABS Oral Take 125 mcg by mouth daily.    Marland Kitchen DILTIAZEM HCL ER COATED BEADS 240 MG PO CP24 Oral Take 240 mg by mouth daily.    . ETODOLAC 300 MG PO CAPS Oral Take 300 mg by mouth 2 (two) times daily.    . INSULIN ASPART 100 UNIT/ML [place] SOLN Subcutaneous Inject 0-10 Units into the skin 3 (three) times daily before meals.    . INSULIN GLARGINE 100 UNIT/ML [place] SOLN Subcutaneous Inject 25-30 Units into the skin at bedtime. Based on blood sugar    . LANCETS MISC  As directed    . METFORMIN HCL 500 MG PO TABS Oral Take 500 mg by mouth 2 (two) times daily with a meal.    . OMEPRAZOLE 20 MG PO CPDR Oral Take 20 mg by mouth 2 (two) times daily.    . OXYCODONE-ACETAMINOPHEN 10-325 MG PO TABS Oral Take 1 tablet by mouth every 4 (four) hours as needed.  For pain    . WARFARIN SODIUM 5 MG PO TABS Oral Take 5 mg by mouth daily.    Marland Kitchen LASIX 40 MG PO TABS  TAKE 1 OR 2 TABLETS      DAILY. 60 each 6    BP 150/69  Temp 98.3 F (36.8 C) (Oral)  Resp 18  SpO2 98%  Physical Exam  Constitutional: He is oriented to person, place, and time. He appears well-developed and well-nourished. No distress.  Eyes: Pupils are equal, round, and reactive to light.  Cardiovascular: Normal rate and regular rhythm.   Pulmonary/Chest: Effort normal and breath sounds normal.  Neurological: He is alert and oriented to person, place, and time.  Skin: Skin is warm and dry.    ED Course  Procedures (including critical care time)  Labs Reviewed  GLUCOSE, CAPILLARY - Abnormal; Notable for the following:    Glucose-Capillary 395 (*)     All other components  within normal limits     Patient is, advised to get his insulin filled tomorrow and to return here as needed MDM         Carlyle Dolly, PA-C 06/01/12 0011

## 2012-06-07 ENCOUNTER — Ambulatory Visit: Payer: Medicare Other | Admitting: Internal Medicine

## 2012-06-11 ENCOUNTER — Other Ambulatory Visit: Payer: Self-pay | Admitting: *Deleted

## 2012-06-11 MED ORDER — FUROSEMIDE 40 MG PO TABS: 40.0000 mg | ORAL_TABLET | Freq: Every day | ORAL | Status: DC

## 2012-06-11 NOTE — Progress Notes (Signed)
  Subjective:    Patient ID: [name], male    DOB: 06/06/1935, 77 y.o.   MRN: [id number]  HPI  error  Review of Systems     Objective:   Physical Exam        Assessment & Plan:

## 2012-06-26 ENCOUNTER — Other Ambulatory Visit: Payer: Self-pay | Admitting: Dermatology

## 2012-06-29 ENCOUNTER — Other Ambulatory Visit: Payer: Self-pay | Admitting: *Deleted

## 2012-06-29 MED ORDER — OXYCODONE-ACETAMINOPHEN 10-325 MG PO TABS: 1.0000 | ORAL_TABLET | ORAL | Status: DC | PRN

## 2012-07-01 IMAGING — CR DG CHEST 2V
1 series · 1 of 1 positions shown · non-contrast
Comparison: 07/04/2011.

CLINICAL DATA: Follow-up implantable device placement.

CHEST - 2 VIEW

[view not recorded]
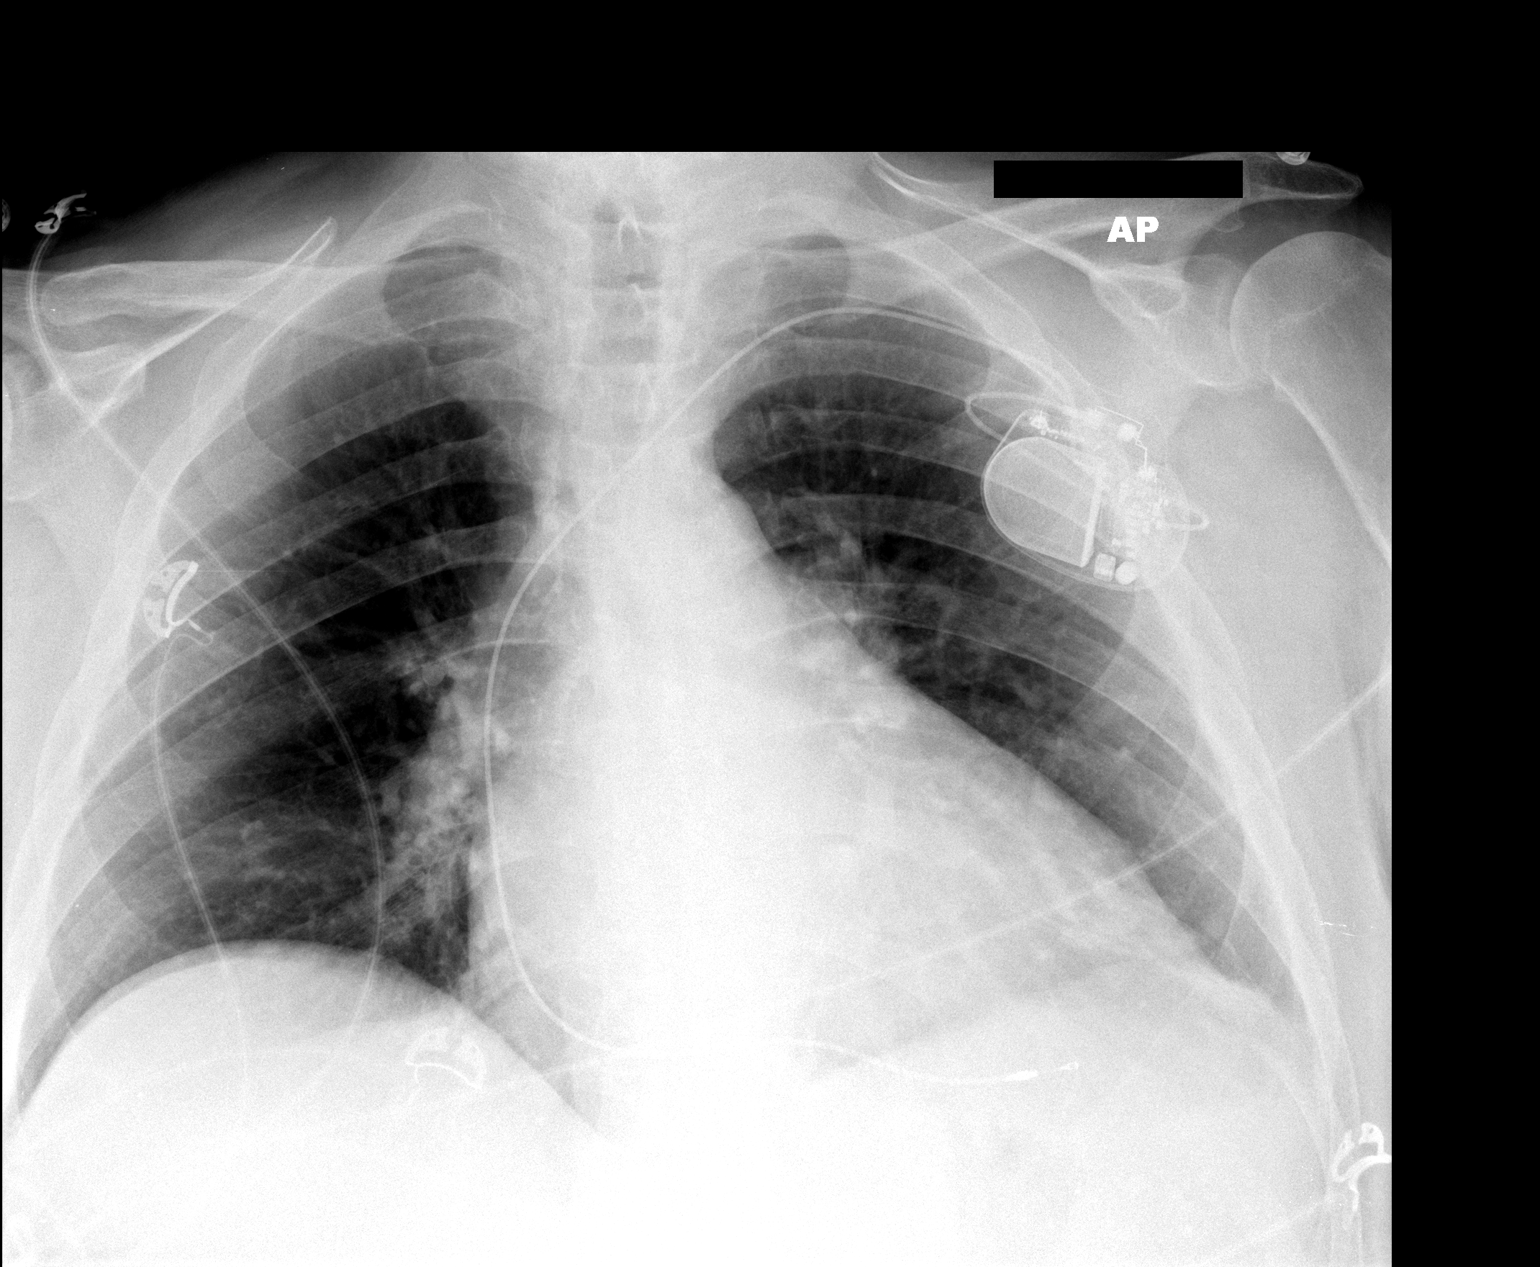

[1 of 1 positions shown; findings below may reference images not displayed]

FINDINGS: The patient was unable to raise his left arm for the
lateral views due to recent procedure.  There is a left subclavian
pacemaker with its lead at the right ventricular apex.  Heart size
and mediastinal contours are stable.  There is no pneumothorax or
edema.  There is no significant pleural effusion.
IMPRESSION: Interval pacemaker placement without demonstrated complication.

## 2012-07-03 ENCOUNTER — Telehealth: Payer: Self-pay | Admitting: Internal Medicine

## 2012-07-03 MED ORDER — OXYCODONE-ACETAMINOPHEN 10-325 MG PO TABS: 1.0000 | ORAL_TABLET | ORAL | Status: DC | PRN

## 2012-07-03 NOTE — Telephone Encounter (Signed)
Printed and will call pt to pick up after dr jenkins signs 

## 2012-07-03 NOTE — Telephone Encounter (Signed)
Patient called stating that his oxycodone was written for 30 pills and he usually gets 120 and he will run out as he only has 15 left. Please assist.

## 2012-07-10 ENCOUNTER — Encounter: Payer: Self-pay | Admitting: Internal Medicine

## 2012-07-10 ENCOUNTER — Ambulatory Visit (INDEPENDENT_AMBULATORY_CARE_PROVIDER_SITE_OTHER): Payer: Medicare Other | Admitting: Internal Medicine

## 2012-07-10 VITALS — BP 130/64 | HR 72 | Temp 98.3°F | Resp 16 | Ht 70.5 in | Wt 192.0 lb

## 2012-07-10 DIAGNOSIS — Z7901 Long term (current) use of anticoagulants: Secondary | ICD-10-CM

## 2012-07-10 DIAGNOSIS — E1169 Type 2 diabetes mellitus with other specified complication: Secondary | ICD-10-CM

## 2012-07-10 DIAGNOSIS — I4891 Unspecified atrial fibrillation: Secondary | ICD-10-CM

## 2012-07-10 DIAGNOSIS — E1165 Type 2 diabetes mellitus with hyperglycemia: Secondary | ICD-10-CM

## 2012-07-10 DIAGNOSIS — IMO0002 Reserved for concepts with insufficient information to code with codable children: Secondary | ICD-10-CM

## 2012-07-10 DIAGNOSIS — E785 Hyperlipidemia, unspecified: Secondary | ICD-10-CM

## 2012-07-10 DIAGNOSIS — I495 Sick sinus syndrome: Secondary | ICD-10-CM

## 2012-07-10 LAB — BASIC METABOLIC PANEL
CO2: 29 mEq/L (ref 19–32)
Chloride: 95 mEq/L — ABNORMAL LOW (ref 96–112)
Potassium: 4.2 mEq/L (ref 3.5–5.1)

## 2012-07-10 LAB — CBC WITH DIFFERENTIAL/PLATELET
Basophils Absolute: 0 10*3/uL (ref 0.0–0.1)
Eosinophils Relative: 3 % (ref 0.0–5.0)
HCT: 37.9 % — ABNORMAL LOW (ref 39.0–52.0)
Hemoglobin: 12.6 g/dL — ABNORMAL LOW (ref 13.0–17.0)
Lymphocytes Relative: 23.6 % (ref 12.0–46.0)
Lymphs Abs: 1.9 10*3/uL (ref 0.7–4.0)
Monocytes Relative: 6.4 % (ref 3.0–12.0)
Neutro Abs: 5.4 10*3/uL (ref 1.4–7.7)
Platelets: 179 10*3/uL (ref 150.0–400.0)
RDW: 13.8 % (ref 11.5–14.6)
WBC: 8.1 10*3/uL (ref 4.5–10.5)

## 2012-07-10 LAB — TSH: TSH: 3.43 u[IU]/mL (ref 0.35–5.50)

## 2012-07-10 LAB — POCT INR: INR: 3.8

## 2012-07-10 LAB — HEMOGLOBIN A1C: Hgb A1c MFr Bld: 8.4 % — ABNORMAL HIGH (ref 4.6–6.5)

## 2012-07-10 NOTE — Progress Notes (Signed)
Subjective:    Patient ID: [name], male    DOB: [birth year], 77 y.o.   MRN: [id number]  HPI Patient salt hypertension diabetes gastroesophageal reflux and chronic atrial fibrillation.  He is treated with Coumadin for anticoagulation for his atrial fibrillation he is on Cardizem as well as controls his rate today he appears to be in sinus rhythm   Review of Systems  Constitutional: Negative for fever and fatigue.  HENT: Negative for hearing loss, congestion, neck pain and postnasal drip.   Eyes: Negative for discharge, redness and visual disturbance.  Respiratory: Negative for cough, shortness of breath and wheezing.   Cardiovascular: Negative for leg swelling.  Gastrointestinal: Negative for abdominal pain, constipation and abdominal distention.  Genitourinary: Negative for urgency and frequency.  Musculoskeletal: Negative for joint swelling and arthralgias.  Skin: Negative for color change and rash.  Neurological: Negative for weakness and light-headedness.  Hematological: Negative for adenopathy.  Psychiatric/Behavioral: Negative for behavioral problems.   Past Medical History  Diagnosis Date  . Fatigue   . Overweight   . Syncope and collapse   . GERD (gastroesophageal reflux disease)   . Right bundle branch block   . Unspecified venous (peripheral) insufficiency   . Phlebitis and thrombophlebitis of superficial vessels of lower extremities   . Cellulitis and abscess of leg, except foot   . Backache, unspecified   . Encounter for long-term (current) use of anticoagulants   . Type II or unspecified type diabetes mellitus without mention of complication, not stated as uncontrolled   . Unspecified venous (peripheral) insufficiency   . Hypertension   . Gout, unspecified   . Heart failure   . Atrial fibrillation     permanent  . Esophageal stricture   . Colon polyps   . Diverticulosis   . Tachycardia-bradycardia     s/p PPM    History   Social History  .  Marital Status: Married    Spouse Name: [name]    Number of Children: [name]  . Years of Education: [name]   Occupational History  . retired    Social History Main Topics  . Smoking status: Never Smoker   . Smokeless tobacco: Not on file  . Alcohol Use: Yes  . Drug Use: No  . Sexually Active: Yes   Other Topics Concern  . Not on file   Social History Narrative  . No narrative on file    Past Surgical History  Procedure Date  . Laminectomy   . Lumbar fusion   . Rotator cuff repair   . Ruptured rt rectus muscle   . Eye surgery   . Pacemaker insertion 8/12    SJM by Dr Johney Frame for tachy/brady syndrome    Family History  Problem Relation Age of Onset  . Pneumonia Mother   . Heart disease Father     Allergies  Allergen Reactions  . Morphine     REACTION: rash, irritability    Current Outpatient Prescriptions on File Prior to Visit  Medication Sig Dispense Refill  . BD INSULIN SYRINGE ULTRAFINE 31G X 5/16" 0.3 ML MISC USE AS DIRECTED.  100 each  PRN  . digoxin (LANOXIN) 0.125 MG tablet Take 125 mcg by mouth daily.      Marland Kitchen diltiazem (CARDIZEM CD) 240 MG 24 hr capsule Take 240 mg by mouth daily.      Marland Kitchen etodolac (LODINE) 300 MG capsule Take 300 mg by mouth 2 (two) times daily.      . furosemide (LASIX)  40 MG tablet Take 1 tablet (40 mg total) by mouth daily.  60 tablet  6  . Lancets MISC As directed      . LANTUS 100 UNIT/ML injection INJECT 25-30 UNITS AT BEDTIME AS DIRECTED.  10 mL  6  . metFORMIN (GLUCOPHAGE) 500 MG tablet Take 500 mg by mouth 2 (two) times daily with a meal.      . NOVOLOG 100 UNIT/ML injection SLIDING SCALE: >300:10U, 200-300: 5U, <200:0U.  10 mL  6  . omeprazole (PRILOSEC) 20 MG capsule Take 20 mg by mouth 2 (two) times daily.      Marland Kitchen oxyCODONE-acetaminophen (PERCOCET) 10-325 MG per tablet Take 1 tablet by mouth every 4 (four) hours as needed. For pain  120 tablet  0  . warfarin (COUMADIN) 5 MG tablet Take 5 mg by mouth daily.        BP 130/64  Pulse 72   Temp 98.3 F (36.8 C)  Resp 16  Ht 5' 10.5" (1.791 m)  Wt 192 lb (87.091 kg)  BMI 27.16 kg/m2       Objective:   Physical Exam  Constitutional: He appears well-developed and well-nourished.  HENT:  Head: Normocephalic and atraumatic.  Eyes: Conjunctivae are normal. Pupils are equal, round, and reactive to light.  Neck: Normal range of motion. Neck supple.  Cardiovascular: Normal rate and regular rhythm.   Pulmonary/Chest: Effort normal and breath sounds normal.  Abdominal: Soft. Bowel sounds are normal.          Assessment & Plan:  Today we will do a pro time we will measure a digoxin level and we will look at a hemoglobin A1c we will also get a basic metabolic look at his potassium on Lasix  Stable HTN Stable lipids Urged to stop [address]

## 2012-07-10 NOTE — Patient Instructions (Addendum)
The patient is instructed to continue all medications as prescribed. Schedule followup with check out clerk upon leaving the clinic    Latest dosing instructions   Total Sun Mon Tue Wed Thu Fri Sat   35 5 mg 5 mg 5 mg 5 mg 5 mg 5 mg 5 mg    (5 mg1) (5 mg1) (5 mg1) (5 mg1) (5 mg1) (5 mg1) (5 mg1)       Called [name] and these are her instructions for coumadin dose.

## 2012-07-20 ENCOUNTER — Telehealth: Payer: Self-pay | Admitting: Internal Medicine

## 2012-07-20 MED ORDER — OXYCODONE HCL 10 MG PO TABS: 10.0000 mg | ORAL_TABLET | ORAL | Status: DC | PRN

## 2012-07-20 NOTE — Telephone Encounter (Signed)
Patient is calling for a change in his Oxycodone.  The current prescription costs $45 a month.  If it is written for Oxycodone 10 the cost would only be $6.  Okay to fill?

## 2012-07-20 NOTE — Telephone Encounter (Signed)
Pt requesting to talk to [facility] or Dr. Lovell Sheehan about his oxyCODONE-acetaminophen (PERCOCET) 10-325 MG per tablet

## 2012-07-20 NOTE — Telephone Encounter (Signed)
rx ready for pick up and patient is aware  

## 2012-07-27 ENCOUNTER — Ambulatory Visit (INDEPENDENT_AMBULATORY_CARE_PROVIDER_SITE_OTHER): Payer: Medicare Other | Admitting: Family

## 2012-07-27 DIAGNOSIS — I495 Sick sinus syndrome: Secondary | ICD-10-CM

## 2012-07-27 NOTE — Patient Instructions (Signed)
Hold Coumadin Saturday. Sunday take 1/2 tablet. Then 1 tab (5mg ) a day except 1/2 tab (2.5mg ) on Tuesday and Thursday    Latest dosing instructions   Total Sun Mon Tue Wed Thu Fri Sat   30 5 mg 5 mg 2.5 mg 5 mg 2.5 mg 5 mg 5 mg    (5 mg1) (5 mg1) (5 mg0.5) (5 mg1) (5 mg0.5) (5 mg1) (5 mg1)

## 2012-08-01 ENCOUNTER — Other Ambulatory Visit: Payer: Self-pay

## 2012-08-01 MED ORDER — DILTIAZEM HCL ER COATED BEADS 240 MG PO CP24: 240.0000 mg | ORAL_CAPSULE | Freq: Every day | ORAL | Status: DC

## 2012-08-09 ENCOUNTER — Encounter: Payer: Self-pay | Admitting: Gastroenterology

## 2012-08-10 ENCOUNTER — Encounter: Payer: Medicare Other | Admitting: Family

## 2012-08-10 ENCOUNTER — Telehealth: Payer: Self-pay | Admitting: Family Medicine

## 2012-08-10 NOTE — Telephone Encounter (Signed)
Pt canceled pt/INR visit:  Caro Laroche Facility: not collected Patient: [name], [name] DOB: 26-Sep-1935 Phone: [phone] Reason for Call: See info below Regarding Appointment: Yes Appt Date: 08/10/2012 Appt Time: 11:00:00 AM Provider: Darryll Capers (Adults only) Reason: Cancel Appointment Details: Pt can't go out due to recent procedure and needs to cancel this appt. Please call pt so appt can be rescheduled. Instructed pt to call back in AM to verify message was received. Outcome: Instructed patient to call back on the next business day.

## 2012-08-14 ENCOUNTER — Telehealth: Payer: Self-pay | Admitting: Family

## 2012-08-14 NOTE — Telephone Encounter (Signed)
Appointment scheduled.

## 2012-08-14 NOTE — Telephone Encounter (Signed)
Left detailed message to  Notify pt of the importance of rescheduling his pt/inr appointment asap

## 2012-08-14 NOTE — Telephone Encounter (Signed)
Please schedule INR recheck. Patient was due back in 2 weeks because he was high.

## 2012-08-15 ENCOUNTER — Encounter: Payer: Medicare Other | Admitting: Family

## 2012-08-15 NOTE — Progress Notes (Signed)
  Subjective:    Patient ID: [name], male    DOB: 1935/06/21, 77 y.o.   MRN: [id number]  HPI  Patient is a 77 year old male who presents for followup of hypertension atrial fibrillation and adult-onset diabetes. He also requires an INR for measurement of anticoagulation for atrial fibrillation.  Review of Systems  Constitutional: Positive for fatigue. Negative for activity change and appetite change.  Respiratory: Positive for shortness of breath. Negative for chest tightness.   Cardiovascular: Positive for palpitations and leg swelling.  Gastrointestinal: Negative for abdominal pain and abdominal distention.  Musculoskeletal: Positive for myalgias, back pain and arthralgias.  Neurological: Negative for weakness.       Objective:   Physical Exam  Nursing note and vitals reviewed. Constitutional: He is oriented to person, place, and time. He appears well-developed and well-nourished.  HENT:  Head: Normocephalic and atraumatic.  Cardiovascular:  Murmur heard.      Regular rate and rhythm  Pulmonary/Chest: Effort normal and breath sounds normal.  Abdominal: Soft. Bowel sounds are normal.  Neurological: He is alert and oriented to person, place, and time.          Assessment & Plan:  Monitor Coumadin therapy with an INR today adjust INR as necessary.  Discussed diabetic control patient states CBGs have been within normal range.  Hypertension has been stable  discussed pain management refill pain medicines

## 2012-08-22 ENCOUNTER — Encounter: Payer: Self-pay | Admitting: Physician Assistant

## 2012-08-24 ENCOUNTER — Encounter: Payer: Self-pay | Admitting: *Deleted

## 2012-08-27 ENCOUNTER — Ambulatory Visit (INDEPENDENT_AMBULATORY_CARE_PROVIDER_SITE_OTHER): Payer: Medicare Other | Admitting: Physician Assistant

## 2012-08-27 ENCOUNTER — Encounter: Payer: Self-pay | Admitting: Physician Assistant

## 2012-08-27 ENCOUNTER — Telehealth: Payer: Self-pay | Admitting: *Deleted

## 2012-08-27 VITALS — BP 132/60 | HR 80 | Ht 70.5 in | Wt 189.0 lb

## 2012-08-27 DIAGNOSIS — K222 Esophageal obstruction: Secondary | ICD-10-CM

## 2012-08-27 DIAGNOSIS — R1310 Dysphagia, unspecified: Secondary | ICD-10-CM

## 2012-08-27 DIAGNOSIS — Z860100 Personal history of colon polyps, unspecified: Secondary | ICD-10-CM

## 2012-08-27 MED ORDER — NA SULFATE-K SULFATE-MG SULF 17.5-3.13-1.6 GM/177ML PO SOLN: ORAL | Status: DC

## 2012-08-27 NOTE — Patient Instructions (Addendum)
You have been scheduled for an endoscopy and colonoscopy. Please follow the written instructions given to you at your visit today. Please pick up your prep at the pharmacy within the next 1-3 days. If you use inhalers (even only as needed), please bring them with you on the day of your procedure.  We have sent the following medications to your pharmacy for you to pick up at your convenience: Suprep

## 2012-08-27 NOTE — Progress Notes (Signed)
Subjective:    Patient ID: [name], male    DOB: 1935-11-18, 77 y.o.   MRN: [id number]  HPI [name] is a very nice 77 year old white male known to [name] who has history of adenomatous colon polyps, diverticulosis, and an esophageal stricture. He is on chronic Coumadin for atrial for ablation and also is status post pacemaker placement for tachybradycardia syndrome. Other medical problems include hypertension diabetes and congestive heart failure. He comes in today regarding followup colonoscopy, and also has complaints of recurrent dysphagia . His last colonoscopy was done in 2007 and he did have 2 adenomatous polyps removed. Last EGD was done in 2011 with balloon dilation of an esophageal stricture centimeters proximal to the GE junction . Patient has no complaints regarding changes in bowel habits abdominal pain melena or hematochezia. He does say that he started having recurrent dysphagia over the past month or so. He has had a couple of episodes where he has  gone 2-3 days in a row with  inability to eat. He is unaware of definite food impaction but it sounds like he is having transient impactions and when the past he's able to eat again until it recurs. He does admit these had to make himself vomit on occasion. He denies any heartburn or indigestion. He has been  off of his Coumadin for multiple different procedures and has not had any problems in the past. Seen by [name] for cardiology.    Review of Systems  Constitutional: Negative.   HENT: Positive for trouble swallowing.   Eyes: Negative.   Respiratory: Negative.   Gastrointestinal: Negative.   Genitourinary: Negative.   Musculoskeletal: Negative.   Skin: Negative.   Neurological: Negative.   Hematological: Negative.   Psychiatric/Behavioral: Negative.    Outpatient Prescriptions Prior to Visit  Medication Sig Dispense Refill  . BD INSULIN SYRINGE ULTRAFINE 31G X 5/16" 0.3 ML MISC USE AS DIRECTED.  100 each  PRN    . digoxin (LANOXIN) 0.125 MG tablet Take 125 mcg by mouth daily.      Marland Kitchen diltiazem (CARDIZEM CD) 240 MG 24 hr capsule Take 1 capsule (240 mg total) by mouth daily.  30 capsule  6  . etodolac (LODINE) 300 MG capsule Take 300 mg by mouth 2 (two) times daily.      . furosemide (LASIX) 40 MG tablet Take 1 tablet (40 mg total) by mouth daily.  60 tablet  6  . Lancets MISC As directed      . LANTUS 100 UNIT/ML injection INJECT 25-30 UNITS AT BEDTIME AS DIRECTED.  10 mL  6  . metFORMIN (GLUCOPHAGE) 500 MG tablet Take 500 mg by mouth 2 (two) times daily with a meal.      . NOVOLOG 100 UNIT/ML injection SLIDING SCALE: >300:10U, 200-300: 5U, <200:0U.  10 mL  6  . omeprazole (PRILOSEC) 20 MG capsule Take 20 mg by mouth 2 (two) times daily.      . Oxycodone HCl 10 MG TABS Take 1 tablet (10 mg total) by mouth every 4 (four) hours as needed.  120 tablet  0  . oxyCODONE-acetaminophen (PERCOCET) 10-325 MG per tablet Take 1 tablet by mouth every 4 (four) hours as needed. For pain  120 tablet  0  . warfarin (COUMADIN) 5 MG tablet Take 5 mg by mouth daily.       Allergies  Allergen Reactions  . Morphine     REACTION: rash, irritability   Patient Active Problem List  Diagnosis  .  DIABETES MELLITUS, TYPE II  . GOUT  . OVERWEIGHT/OBESITY  . HYPERTENSION  . RBBB  . ATRIAL FIBRILLATION  . CONGESTIVE HEART FAILURE  . PHLEBITIS&THROMBOPHLEB SUP VESSELS LOWER EXTREM  . Unspecified Venous (Peripheral) Insufficiency  . URI  . ESOPHAGEAL STRICTURE  . GERD  . CELLULITIS/ABSCESS, LEG  . BACK PAIN, CHRONIC  . ADHESIVE CAPSULITIS OF SHOULDER  . DUPUYTREN'S CONTRACTURE, LEFT  . SYNCOPE AND COLLAPSE  . FATIGUE / MALAISE  . DYSPHAGIA UNSPECIFIED  . CERVICAL STRAIN, ACUTE  . MULTIPLE&UNSPEC OPEN WOUND LOWER LIMB COMP  . Tachycardia-bradycardia syndrome  . ACTINIC KERATOSIS  . ETOH abuse   History   Social History  . Marital Status: Married    Spouse Name: [name]    Number of Children: 2  . Years of  Education: [name]   Occupational History  . Retired     Social History Main Topics  . Smoking status: Never Smoker   . Smokeless tobacco: Never Used  . Alcohol Use: Yes     two drinks daily   . Drug Use: No  . Sexually Active: Yes   Other Topics Concern  . Not on file   Social History Narrative   2 caffeine drinks daily        Objective:   Physical Exam well-developed elderly white male in no acute distress, pleasant blood pressure 132/60 pulse 80 height 5 foot 10 weight 189. HEENT; nontraumatic normocephalic EOMI PERRLA,neck; Supple no JVD, Cardiovascular; regular rate and rhythm with S1-S2 occasional ectopic no significant murmur rub or gallop, pacemaker in the chest wall, Pulmonary; clear bilaterally, Abdomen; soft nontender nondistended bowel sounds are active there is no palpable mass or hepatosplenomegaly, Rectal; exam not done        Assessment & Plan:  #4 77 year old male on chronic Coumadin for atrial fibrillation due for followup colonoscopy with history of adenomatous colon polyps. He is an appropriate candidate for followup colonoscopy #2 history of esophageal stricture status post dilation 2011, now with recurrent dysphagia x1 month. #3 diabetes mellitus #4 that is post pacemaker tachybradycardia syndrome #5 hypertension #6 CHF  Plan; Will schedule for upper endoscopy with balloon dilation  And colonoscopy with [name] . Procedures discussed in detail with the patient and he is agreeable to proceed  He will come off of his Coumadin for 5 days prior to the procedures , and we will send a consent letter to his cardiologist  For " okay" to hold his anticoagulation .

## 2012-08-27 NOTE — Progress Notes (Signed)
Reviewed and agree with management. Tasheika Kitzmiller D. Jedd Schulenburg, M.D., FACG  

## 2012-08-27 NOTE — Telephone Encounter (Signed)
08/27/2012    RE: JW VELASCO DOB: 06/16/1935 MRN: [id number]   Dear Dr. Hillery Jacks,   We have scheduled the above patient for an endoscopic procedure. Our records show that he is on anticoagulation therapy.   Please advise as to how long the patient may come off his therapy of Coumadin prior to the procedure, which is scheduled for 09-10-2012.  Please fax back/ or route the completed form to Ok Anis, CMA at [phone].   Sincerely,  Ok Anis

## 2012-08-27 NOTE — Telephone Encounter (Signed)
Stop coumadin for 5 days and restart immediately following the procedure

## 2012-08-28 NOTE — Telephone Encounter (Signed)
Called patient and advised him that per Dr. Johney Frame he can stop his Coumadin 5 days before his procedure.  Patient verbalized his understanding to stop his Coumadin 5 days before procedure.

## 2012-09-05 ENCOUNTER — Telehealth: Payer: Self-pay | Admitting: Gastroenterology

## 2012-09-05 NOTE — Telephone Encounter (Signed)
Pt states he has been doing some reading and does not think he needs an EGD right now. Appt cancelled and pt aware. Appt was scheduled for 09/10/12 @[facility] .

## 2012-09-05 NOTE — Telephone Encounter (Signed)
This patient still scheduled for colonoscopy? Is he having dysphagia? If he is having dysphagia then I think he should undergo upper endoscopy. Keep in mind that he has to hold Coumadin so I probably would do both procedures at the same setting. He can have endoscopy done at the [facility] instead of the hospital.

## 2012-09-10 ENCOUNTER — Telehealth: Payer: Self-pay | Admitting: Family Medicine

## 2012-09-10 ENCOUNTER — Ambulatory Visit (HOSPITAL_COMMUNITY): Admit: 2012-09-10 | Payer: Medicare Other | Admitting: Gastroenterology

## 2012-09-10 ENCOUNTER — Encounter (HOSPITAL_COMMUNITY): Payer: Self-pay

## 2012-09-10 SURGERY — EGD (ESOPHAGOGASTRODUODENOSCOPY)
Anesthesia: Moderate Sedation

## 2012-09-10 MED ORDER — OXYCODONE HCL 10 MG PO TABS: 10.0000 mg | ORAL_TABLET | ORAL | Status: DC | PRN

## 2012-09-10 NOTE — Telephone Encounter (Signed)
Pt requesting refill on his Oxycodone HCl 10 MG TABS. Please call when ready for pick up.

## 2012-09-10 NOTE — Telephone Encounter (Signed)
Printed - will call pt to pick up when dr Lovell Sheehan

## 2012-10-11 ENCOUNTER — Telehealth: Payer: Self-pay | Admitting: Internal Medicine

## 2012-10-11 ENCOUNTER — Emergency Department (HOSPITAL_COMMUNITY)
Admission: EM | Admit: 2012-10-11 | Discharge: 2012-10-11 | Disposition: A | Discharge status: Home / Self Care | Payer: Medicare Other | Attending: Emergency Medicine | Admitting: Emergency Medicine

## 2012-10-11 ENCOUNTER — Encounter (HOSPITAL_COMMUNITY): Payer: Self-pay | Admitting: Emergency Medicine

## 2012-10-11 DIAGNOSIS — K219 Gastro-esophageal reflux disease without esophagitis: Secondary | ICD-10-CM | POA: Insufficient documentation

## 2012-10-11 DIAGNOSIS — I4510 Unspecified right bundle-branch block: Secondary | ICD-10-CM | POA: Insufficient documentation

## 2012-10-11 DIAGNOSIS — Z79899 Other long term (current) drug therapy: Secondary | ICD-10-CM | POA: Insufficient documentation

## 2012-10-11 DIAGNOSIS — I872 Venous insufficiency (chronic) (peripheral): Secondary | ICD-10-CM | POA: Insufficient documentation

## 2012-10-11 DIAGNOSIS — Z794 Long term (current) use of insulin: Secondary | ICD-10-CM | POA: Insufficient documentation

## 2012-10-11 DIAGNOSIS — I4891 Unspecified atrial fibrillation: Secondary | ICD-10-CM | POA: Insufficient documentation

## 2012-10-11 DIAGNOSIS — Z860100 Personal history of colon polyps, unspecified: Secondary | ICD-10-CM | POA: Insufficient documentation

## 2012-10-11 DIAGNOSIS — I8000 Phlebitis and thrombophlebitis of superficial vessels of unspecified lower extremity: Secondary | ICD-10-CM | POA: Insufficient documentation

## 2012-10-11 DIAGNOSIS — M7989 Other specified soft tissue disorders: Secondary | ICD-10-CM | POA: Insufficient documentation

## 2012-10-11 DIAGNOSIS — Z8719 Personal history of other diseases of the digestive system: Secondary | ICD-10-CM | POA: Insufficient documentation

## 2012-10-11 DIAGNOSIS — Z7901 Long term (current) use of anticoagulants: Secondary | ICD-10-CM | POA: Insufficient documentation

## 2012-10-11 DIAGNOSIS — I509 Heart failure, unspecified: Secondary | ICD-10-CM | POA: Insufficient documentation

## 2012-10-11 DIAGNOSIS — E119 Type 2 diabetes mellitus without complications: Secondary | ICD-10-CM | POA: Insufficient documentation

## 2012-10-11 DIAGNOSIS — I10 Essential (primary) hypertension: Secondary | ICD-10-CM | POA: Insufficient documentation

## 2012-10-11 DIAGNOSIS — Z8679 Personal history of other diseases of the circulatory system: Secondary | ICD-10-CM | POA: Insufficient documentation

## 2012-10-11 LAB — CBC WITH DIFFERENTIAL/PLATELET
Hemoglobin: 12.4 g/dL — ABNORMAL LOW (ref 13.0–17.0)
Lymphocytes Relative: 28 % (ref 12–46)
Lymphs Abs: 2.1 10*3/uL (ref 0.7–4.0)
Monocytes Relative: 5 % (ref 3–12)
Neutro Abs: 4.8 10*3/uL (ref 1.7–7.7)
Neutrophils Relative %: 64 % (ref 43–77)
Platelets: 229 10*3/uL (ref 150–400)
RBC: 4.04 MIL/uL — ABNORMAL LOW (ref 4.22–5.81)
WBC: 7.5 10*3/uL (ref 4.0–10.5)

## 2012-10-11 LAB — BASIC METABOLIC PANEL
CO2: 25 mEq/L (ref 19–32)
Chloride: 92 mEq/L — ABNORMAL LOW (ref 96–112)
GFR calc non Af Amer: 71 mL/min — ABNORMAL LOW (ref >90)
Glucose, Bld: 237 mg/dL — ABNORMAL HIGH (ref 70–99)
Potassium: 3.7 mEq/L (ref 3.5–5.1)
Sodium: 132 mEq/L — ABNORMAL LOW (ref 135–145)

## 2012-10-11 LAB — PROTIME-INR: INR: 2.99 — ABNORMAL HIGH (ref 0.00–1.49)

## 2012-10-11 NOTE — ED Notes (Signed)
Pt c/o left leg pain and swelling x 3 days with redness and swelling with some weeping

## 2012-10-11 NOTE — ED Notes (Signed)
Pt states triage nurse at [facility] was concerned about possible blot clot since swelling in one leg and not the other. Pt states he has been dizzy the past few weeks--states one of his "pills" says may cause dizziness. Pt is mentating appropriately.

## 2012-10-11 NOTE — Telephone Encounter (Signed)
Pt has spot on his leg, about 4 inches from his ankle. It is swollen and is "weeping". Pt has appt on Mon. 10/15/12, but would like to come in sooner. Offered another provider, pt refused.  Connected pt to triage nurse, but he still wants to try to get in.

## 2012-10-11 NOTE — ED Notes (Signed)
Pt aware of follow up appointment at 8am for vascular study. Pt has been given instructions and directions for appointment. Pt does not have any other questions upon d/c. Pt does not appear to be in any acute distress upon d/c.

## 2012-10-11 NOTE — ED Provider Notes (Signed)
History     CSN: [id number]  Arrival date & time 10/11/12  1718   First MD Initiated Contact with Patient 10/11/12 1935      Chief Complaint  Patient presents with  . Leg Swelling     The history is provided by the patient.   the patient reports worsening swelling of his lower extremities bilaterally over the past 3 days.  He does report a small amount of redness and weeping from his left leg.  He denies fevers or chills.  He reports no tenderness to palpation.  He is on Coumadin for history of A. fib.  He denies recent surgery or trauma.  He denies any recent long travel.  He has no prior history of DVT or pulmonary embolism.  He reports he called his primary care office today and I recommended he come to the ER for evaluation for possible DVT.  He has no chest pain or shortness of breath.  He has no new orthopnea.  He has no dyspnea on exertion.  He has no other complaints.  Past Medical History  Diagnosis Date  . Fatigue   . Overweight   . Syncope and collapse   . GERD (gastroesophageal reflux disease)   . Right bundle branch block   . Unspecified venous (peripheral) insufficiency   . Phlebitis and thrombophlebitis of superficial vessels of lower extremities   . Cellulitis and abscess of leg, except foot   . Backache, unspecified   . Long term (current) use of anticoagulants   . Type II or unspecified type diabetes mellitus without mention of complication, not stated as uncontrolled   . Unspecified venous (peripheral) insufficiency   . Hypertension   . Gout, unspecified   . Heart failure   . Atrial fibrillation     permanent  . Esophageal stricture   . Hx of colonic polyps 05-2006    (Adenomatous)Dr. Arlyce Dice  . Diverticulosis 05-2006    Dr. Arlyce Dice   . Tachycardia-bradycardia     s/p PPM    Past Surgical History  Procedure Date  . Laminectomy   . Lumbar fusion   . Rotator cuff repair   . Ruptured rt rectus muscle   . Eye surgery   . Pacemaker insertion 8/12   SJM by Dr Johney Frame for tachy/brady syndrome    Family History  Problem Relation Age of Onset  . Pneumonia Mother   . Heart disease Father   . Diabetes Father   . Colon cancer Neg Hx     History  Substance Use Topics  . Smoking status: Never Smoker   . Smokeless tobacco: Never Used  . Alcohol Use: Yes     Comment: two drinks daily       Review of Systems  All other systems reviewed and are negative.    Allergies  Morphine  Home Medications   Current Outpatient Rx  Name  Route  Sig  Dispense  Refill  . DIGOXIN 0.125 MG PO TABS   Oral   Take 125 mcg by mouth daily.         Marland Kitchen DILTIAZEM HCL ER COATED BEADS 240 MG PO CP24   Oral   Take 240 mg by mouth daily.         . ETODOLAC 300 MG PO CAPS   Oral   Take 300 mg by mouth 2 (two) times daily.         . FUROSEMIDE 40 MG PO TABS   Oral   Take 1  tablet (40 mg total) by mouth daily.   60 tablet   6   . INSULIN ASPART 100 UNIT/ML [place] SOLN   Subcutaneous   Inject 5-10 Units into the skin 3 (three) times daily before meals. SLIDING SCALE: >300:10U, 200-300: 5U, <200:no insulin         . INSULIN GLARGINE 100 UNIT/ML [place] SOLN   Subcutaneous   Inject 25 Units into the skin at bedtime.         Marland Kitchen LANCETS MISC      As directed         . METFORMIN HCL 500 MG PO TABS   Oral   Take 500 mg by mouth 2 (two) times daily with a meal.         . OMEPRAZOLE 20 MG PO CPDR   Oral   Take 20 mg by mouth 2 (two) times daily.         . OXYCODONE HCL 10 MG PO TABS   Oral   Take 10 mg by mouth every 4 (four) hours as needed. For pain         . WARFARIN SODIUM 5 MG PO TABS   Oral   Take 5 mg by mouth daily.           BP 112/98  Pulse 96  Temp 98.3 F (36.8 C) (Oral)  Resp 18  SpO2 98%  Physical Exam  Nursing note and vitals reviewed. Constitutional: He is oriented to person, place, and time. He appears well-developed and well-nourished.  HENT:  Head: Normocephalic and atraumatic.  Eyes: EOM are  normal.  Neck: Normal range of motion.  Cardiovascular: Normal rate, regular rhythm, normal heart sounds and intact distal pulses.   Pulmonary/Chest: Effort normal and breath sounds normal. No respiratory distress.  Abdominal: Soft. He exhibits no distension. There is no tenderness.  Musculoskeletal: Normal range of motion.       1+ edema of his bilateral lower extremities without erythema.  Normal pulses in his bilateral PT and DP arteries.  Mild drainage from the left lateral aspect of his mid lower leg.  No tenderness.  No warmth  Neurological: He is alert and oriented to person, place, and time.  Skin: Skin is warm and dry.  Psychiatric: He has a normal mood and affect. Judgment normal.    ED Course  Procedures (including critical care time)  Labs Reviewed  CBC WITH DIFFERENTIAL - Abnormal; Notable for the following:    RBC 4.04 (*)     Hemoglobin 12.4 (*)     HCT 36.1 (*)     All other components within normal limits  BASIC METABOLIC PANEL - Abnormal; Notable for the following:    Sodium 132 (*)     Chloride 92 (*)     Glucose, Bld 237 (*)     GFR calc non Af Amer 71 (*)     GFR calc Af Amer 82 (*)     All other components within normal limits  PROTIME-INR - Abnormal; Notable for the following:    Prothrombin Time 29.5 (*)     INR 2.99 (*)     All other components within normal limits   No results found.   No diagnosis found.    MDM  The patient appears to have venous insufficiency.  He was sent to the ER for evaluation for possible DVT.  He is therapeutic on his Coumadin with an INR of 2.99.  He will return in the morning to  have bilateral lower [place] duplexes done.  My suspicion is low but because this is a noninvasive procedure I think this is reasonable.  I don't get any sense that this is cellulitis.  I recommended elevation and compression stockings.  He has no chest pain or shortness of breath.  Has no dyspnea on exertion orthopnea.  This is not a presentation  of congestive heart failure.        Lyanne Co, MD 10/11/12 2042

## 2012-10-11 NOTE — Telephone Encounter (Signed)
Patient Information:  Caller Name: [name]  Phone: [phone]  Patient: [name]  Gender: Male  DOB: 04-13-1935  Age: 77 Years  PCP: Darryll Capers (Adults only)   Symptoms  Reason For Call & Symptoms: Swollen weeping area above left ankle  Reviewed Health History In EMR: Yes  Reviewed Medications In EMR: Yes  Reviewed Allergies In EMR: Yes  Date of Onset of Symptoms: 10/09/2012  Treatments Tried: Washed with peroxide and covered with sterile bandage  Treatments Tried Worked: No  Guideline(s) Used:  Leg Swelling and Edema  Disposition Per Guideline:   Go to ED Now (or to Office with PCP Approval)  Reason For Disposition Reached:   Thigh, calf, or ankle swelling in only one leg  Advice Given:  Call Back If:  Swelling becomes worse  Swelling becomes red or painful to the touch  You become worse.  Office Follow Up:  Does the office need to follow up with this patient?: No  Instructions For The Office: [name]  RN Note:  Patient has new onset swollen, tender, weeping area above left ankle that is approximately 5 inches in diameter.  Patient has appointment for evaluation on 10/15/12 and would like to get in sooner.   Due to disposition of see in Emergency Department patient and office is now closed.  Patient sent to Emergency Department for evaluation.

## 2012-10-11 NOTE — ED Notes (Signed)
Dr. Campos at bedside   

## 2012-10-12 ENCOUNTER — Ambulatory Visit (HOSPITAL_COMMUNITY)
Admission: RE | Admit: 2012-10-12 | Discharge: 2012-10-12 | Disposition: A | Discharge status: Home / Self Care | Payer: Medicare Other | Source: Ambulatory Visit | Attending: Emergency Medicine | Admitting: Emergency Medicine

## 2012-10-12 DIAGNOSIS — E663 Overweight: Secondary | ICD-10-CM | POA: Insufficient documentation

## 2012-10-12 DIAGNOSIS — I872 Venous insufficiency (chronic) (peripheral): Secondary | ICD-10-CM | POA: Insufficient documentation

## 2012-10-12 DIAGNOSIS — M7989 Other specified soft tissue disorders: Secondary | ICD-10-CM | POA: Insufficient documentation

## 2012-10-12 DIAGNOSIS — I4891 Unspecified atrial fibrillation: Secondary | ICD-10-CM | POA: Insufficient documentation

## 2012-10-12 DIAGNOSIS — M79609 Pain in unspecified limb: Secondary | ICD-10-CM | POA: Insufficient documentation

## 2012-10-12 DIAGNOSIS — I509 Heart failure, unspecified: Secondary | ICD-10-CM | POA: Insufficient documentation

## 2012-10-12 NOTE — Progress Notes (Signed)
Bilateral:  No evidence of DVT, superficial thrombosis, or Baker's Cyst.   

## 2012-10-12 NOTE — Telephone Encounter (Signed)
Called pt to discuss call and wife states pt is at ed to have leg checked

## 2012-10-12 NOTE — Telephone Encounter (Signed)
Called pt and wife states he has gone to ed to have leg looked at

## 2012-10-12 NOTE — Telephone Encounter (Signed)
[facility] sent pt to ED. Encounter closed.  

## 2012-10-15 ENCOUNTER — Ambulatory Visit (INDEPENDENT_AMBULATORY_CARE_PROVIDER_SITE_OTHER): Payer: Medicare Other | Admitting: Internal Medicine

## 2012-10-15 ENCOUNTER — Encounter: Payer: Self-pay | Admitting: Internal Medicine

## 2012-10-15 VITALS — BP 146/72 | HR 76 | Temp 98.2°F | Resp 16 | Ht 70.5 in | Wt 186.0 lb

## 2012-10-15 DIAGNOSIS — M549 Dorsalgia, unspecified: Secondary | ICD-10-CM

## 2012-10-15 DIAGNOSIS — I4891 Unspecified atrial fibrillation: Secondary | ICD-10-CM

## 2012-10-15 DIAGNOSIS — M7989 Other specified soft tissue disorders: Secondary | ICD-10-CM

## 2012-10-15 DIAGNOSIS — E1169 Type 2 diabetes mellitus with other specified complication: Secondary | ICD-10-CM

## 2012-10-15 LAB — BASIC METABOLIC PANEL
CO2: 30 mEq/L (ref 19–32)
Chloride: 94 mEq/L — ABNORMAL LOW (ref 96–112)
GFR: 62.08 mL/min (ref >60.00)
Glucose, Bld: 229 mg/dL — ABNORMAL HIGH (ref 70–99)
Potassium: 4.3 mEq/L (ref 3.5–5.1)
Sodium: 133 mEq/L — ABNORMAL LOW (ref 135–145)

## 2012-10-15 MED ORDER — OXYCODONE HCL 15 MG PO TABS: 15.0000 mg | ORAL_TABLET | Freq: Four times a day (QID) | ORAL | Status: DC | PRN

## 2012-10-15 NOTE — Patient Instructions (Addendum)
The patient is instructed to continue all medications as prescribed. Schedule followup with check out clerk upon leaving the clinic schedule protime in 4 weeks

## 2012-10-15 NOTE — Progress Notes (Signed)
Subjective:    Patient ID: [name], male    DOB: 1935-01-28, 77 y.o.   MRN: [id number]  HPI Swollen and weeping leg ultrasound was done to rule out DVT no DVT was seen patient is on anticoagulation for chronic atrial fibrillation.  Prior to this incident the patient had a fall with a bruise and left elbow and left shin area.  I suspect that the weeping and erythema that he felt at that site was due to blood collection underneath the skin resolving.  Increased pain and breakthrough pain noted     Review of Systems  Constitutional: Positive for fatigue. Negative for fever.  HENT: Positive for congestion and rhinorrhea. Negative for hearing loss, neck pain and postnasal drip.   Eyes: Negative for discharge, redness and visual disturbance.  Respiratory: Negative for cough, shortness of breath and wheezing.   Cardiovascular: Positive for palpitations and leg swelling.  Gastrointestinal: Negative for abdominal pain, constipation and abdominal distention.  Genitourinary: Negative for urgency and frequency.  Musculoskeletal: Negative for joint swelling and arthralgias.  Skin: Negative for color change and rash.  Neurological: Negative for weakness and light-headedness.  Hematological: Negative for adenopathy.  Psychiatric/Behavioral: Negative for behavioral problems.   Past Medical History  Diagnosis Date  . Fatigue   . Overweight   . Syncope and collapse   . GERD (gastroesophageal reflux disease)   . Right bundle branch block   . Unspecified venous (peripheral) insufficiency   . Phlebitis and thrombophlebitis of superficial vessels of lower extremities   . Cellulitis and abscess of leg, except foot   . Backache, unspecified   . Long term (current) use of anticoagulants   . Type II or unspecified type diabetes mellitus without mention of complication, not stated as uncontrolled   . Unspecified venous (peripheral) insufficiency   . Hypertension   . Gout, unspecified   .  Heart failure   . Atrial fibrillation     permanent  . Esophageal stricture   . Hx of colonic polyps 05-2006    (Adenomatous)Dr. Arlyce Dice  . Diverticulosis 05-2006    Dr. Arlyce Dice   . Tachycardia-bradycardia     s/p PPM    History   Social History  . Marital Status: Married    Spouse Name: [name]    Number of Children: 2  . Years of Education: [name]   Occupational History  . Retired     Social History Main Topics  . Smoking status: Never Smoker   . Smokeless tobacco: Never Used  . Alcohol Use: Yes     Comment: two drinks daily   . Drug Use: No  . Sexually Active: Yes   Other Topics Concern  . Not on file   Social History Narrative   2 caffeine drinks daily     Past Surgical History  Procedure Date  . Laminectomy   . Lumbar fusion   . Rotator cuff repair   . Ruptured rt rectus muscle   . Eye surgery   . Pacemaker insertion 8/12    SJM by Dr Johney Frame for tachy/brady syndrome    Family History  Problem Relation Age of Onset  . Pneumonia Mother   . Heart disease Father   . Diabetes Father   . Colon cancer Neg Hx     Allergies  Allergen Reactions  . Morphine Rash    REACTION:irritability    Current Outpatient Prescriptions on File Prior to Visit  Medication Sig Dispense Refill  . digoxin (LANOXIN) 0.125 MG tablet  Take 125 mcg by mouth daily.      Marland Kitchen diltiazem (CARDIZEM CD) 240 MG 24 hr capsule Take 240 mg by mouth daily.      Marland Kitchen etodolac (LODINE) 300 MG capsule Take 300 mg by mouth 2 (two) times daily.      . furosemide (LASIX) 40 MG tablet Take 1 tablet (40 mg total) by mouth daily.  60 tablet  6  . insulin aspart (NOVOLOG) 100 UNIT/ML injection Inject 5-10 Units into the skin 3 (three) times daily before meals. SLIDING SCALE: >300:10U, 200-300: 5U, <200:no insulin      . insulin glargine (LANTUS) 100 UNIT/ML injection Inject 25 Units into the skin at bedtime.      . Lancets MISC As directed      . metFORMIN (GLUCOPHAGE) 500 MG tablet Take 500 mg by mouth 2  (two) times daily with a meal.      . omeprazole (PRILOSEC) 20 MG capsule Take 20 mg by mouth 2 (two) times daily.      . Oxycodone HCl 10 MG TABS Take 10 mg by mouth every 4 (four) hours as needed. For pain      . warfarin (COUMADIN) 5 MG tablet Take 5 mg by mouth daily.        BP 146/72  Pulse 76  Temp 98.2 F (36.8 C)  Resp 16  Ht 5' 10.5" (1.791 m)  Wt 186 lb (84.369 kg)  BMI 26.31 kg/m2        Objective:   Physical Exam  Nursing note and vitals reviewed. Constitutional: He appears well-developed and well-nourished.  HENT:  Head: Normocephalic and atraumatic.  Eyes: Conjunctivae normal are normal. Pupils are equal, round, and reactive to light.  Neck: Normal range of motion. Neck supple.  Cardiovascular: Normal rate and regular rhythm.   Pulmonary/Chest: Effort normal and breath sounds normal.  Abdominal: Soft. Bowel sounds are normal.          Assessment & Plan:  Patient's leg appears to be from a fall bruising and excoriation no evidence of DVT per ultrasound no evidence of cellulitis.  I agree that conservative therapy is what is warranted at this point there is no significant edema present at this time blood pressure recheck showed a normal blood pressure patient is in chronic atrial fibrillation.  Monitoring for pain control for back with break through will increased the oxycodone to 15 monitoring A1c

## 2012-10-24 ENCOUNTER — Other Ambulatory Visit: Payer: Self-pay | Admitting: Internal Medicine

## 2012-10-31 ENCOUNTER — Telehealth: Payer: Self-pay | Admitting: Internal Medicine

## 2012-10-31 ENCOUNTER — Ambulatory Visit: Payer: Self-pay | Admitting: Family Medicine

## 2012-10-31 NOTE — Telephone Encounter (Signed)
Bonnye, please advise.

## 2012-10-31 NOTE — Telephone Encounter (Signed)
Patient calling about bilateral edema and "angry red rash" on both legs.  Onset 10/31/12 PM.  States has been travelling and has had flights and has been on his feet most of the day for several days in a row.  States ran out of his furosemide but restarted it 10/31/12.  Per protocol, advised appt within 4 hours; refuses offered appt.  States has commitment at 1630 10/31/12 which he cannot miss.  Patient insists on earlier workin than offered appt at 1600 10/31/12 krs/can

## 2012-10-31 NOTE — Telephone Encounter (Signed)
Talked with pt and he had not taken lasix for 2 days and been continually on feet for 2 days.  C/o bilasteral leg swelling and dermatitis on knee that is itching. Pt offered an appointment this pm and tomorrow am,but refused stating he has a church obligation he could not miss, but pt promised if conditions worsened he would go to urgent care or ed, pt states he would and would keep off legs and keep elevated.  Talked with padonda and she states to take 1 extra lasix today and in am. Pt states he would. Office visit with dr Amador Cunas tomorrow at 2:30

## 2012-11-01 ENCOUNTER — Ambulatory Visit (INDEPENDENT_AMBULATORY_CARE_PROVIDER_SITE_OTHER): Payer: Medicare Other | Admitting: Internal Medicine

## 2012-11-01 ENCOUNTER — Encounter: Payer: Self-pay | Admitting: Internal Medicine

## 2012-11-01 VITALS — BP 130/76 | HR 76 | Temp 98.2°F | Resp 20 | Wt 190.0 lb

## 2012-11-01 DIAGNOSIS — I509 Heart failure, unspecified: Secondary | ICD-10-CM

## 2012-11-01 DIAGNOSIS — I872 Venous insufficiency (chronic) (peripheral): Secondary | ICD-10-CM

## 2012-11-01 DIAGNOSIS — I4891 Unspecified atrial fibrillation: Secondary | ICD-10-CM

## 2012-11-01 DIAGNOSIS — I8000 Phlebitis and thrombophlebitis of superficial vessels of unspecified lower extremity: Secondary | ICD-10-CM

## 2012-11-01 NOTE — Patient Instructions (Signed)
Limit your sodium (Salt) intake  Keep legs elevated as much as possible until the swelling has improved  Increase Lasix to twice a day until swelling and improved been resumed to once daily dosing  Call if you develop any worsening swelling weight gain or shortness of breath

## 2012-11-01 NOTE — Progress Notes (Signed)
Subjective:    Patient ID: [name], male    DOB: 1935-01-21, 77 y.o.   MRN: [id number]  HPI  77 year old patient who has a history of permanent atrial for ablation. He is on chronic Coumadin anticoagulation. He has a history of heart failure peripheral edema and venous insufficiency. He also has remote history of DVT. He presents today with a chief complaint of worsening lower extremity edema over the past 2 days. He had been adequate his furosemide prior to the onset of the edema. Also one day prior to the acute swelling he was at airport throughout the day and spent much of the day standing with his legs in a dependent position. He denies any shortness of breath or other symptoms of heart failure. No chest pain.  Past Medical History  Diagnosis Date  . Fatigue   . Overweight   . Syncope and collapse   . GERD (gastroesophageal reflux disease)   . Right bundle branch block   . Unspecified venous (peripheral) insufficiency   . Phlebitis and thrombophlebitis of superficial vessels of lower extremities   . Cellulitis and abscess of leg, except foot   . Backache, unspecified   . Long term (current) use of anticoagulants   . Type II or unspecified type diabetes mellitus without mention of complication, not stated as uncontrolled   . Unspecified venous (peripheral) insufficiency   . Hypertension   . Gout, unspecified   . Heart failure   . Atrial fibrillation     permanent  . Esophageal stricture   . Hx of colonic polyps 05-2006    (Adenomatous)Dr. Arlyce Dice  . Diverticulosis 05-2006    Dr. Arlyce Dice   . Tachycardia-bradycardia     s/p PPM    History   Social History  . Marital Status: Married    Spouse Name: [name]    Number of Children: 2  . Years of Education: [name]   Occupational History  . Retired     Social History Main Topics  . Smoking status: Never Smoker   . Smokeless tobacco: Never Used  . Alcohol Use: Yes     Comment: two drinks daily   . Drug Use: No  . Sexually  Active: Yes   Other Topics Concern  . Not on file   Social History Narrative   2 caffeine drinks daily     Past Surgical History  Procedure Date  . Laminectomy   . Lumbar fusion   . Rotator cuff repair   . Ruptured rt rectus muscle   . Eye surgery   . Pacemaker insertion 8/12    SJM by Dr Johney Frame for tachy/brady syndrome    Family History  Problem Relation Age of Onset  . Pneumonia Mother   . Heart disease Father   . Diabetes Father   . Colon cancer Neg Hx     Allergies  Allergen Reactions  . Morphine Rash    REACTION:irritability    Current Outpatient Prescriptions on File Prior to Visit  Medication Sig Dispense Refill  . digoxin (LANOXIN) 0.125 MG tablet Take 125 mcg by mouth daily.      Marland Kitchen diltiazem (CARDIZEM CD) 240 MG 24 hr capsule Take 240 mg by mouth daily.      Marland Kitchen etodolac (LODINE) 300 MG capsule Take 300 mg by mouth 2 (two) times daily.      . furosemide (LASIX) 40 MG tablet Take 1 tablet (40 mg total) by mouth daily.  60 tablet  6  . insulin  aspart (NOVOLOG) 100 UNIT/ML injection Inject 5-10 Units into the skin 3 (three) times daily before meals. SLIDING SCALE: >300:10U, 200-300: 5U, <200:no insulin      . insulin glargine (LANTUS) 100 UNIT/ML injection Inject 25 Units into the skin at bedtime.      . Lancets MISC As directed      . metFORMIN (GLUCOPHAGE) 500 MG tablet Take 500 mg by mouth 2 (two) times daily with a meal.      . omeprazole (PRILOSEC) 20 MG capsule Take 20 mg by mouth 2 (two) times daily.      Marland Kitchen oxyCODONE (ROXICODONE) 15 MG immediate release tablet Take 1 tablet (15 mg total) by mouth every 6 (six) hours as needed for pain.  120 tablet  0  . warfarin (COUMADIN) 5 MG tablet Take 5 mg by mouth daily.        BP 130/76  Pulse 76  Temp 98.2 F (36.8 C) (Oral)  Resp 20  Wt 190 lb (86.183 kg)  SpO2 97%       Review of Systems  Constitutional: Negative for fever, chills, appetite change and fatigue.  HENT: Negative for hearing loss, ear  pain, congestion, sore throat, trouble swallowing, neck stiffness, dental problem, voice change and tinnitus.   Eyes: Negative for pain, discharge and visual disturbance.  Respiratory: Negative for cough, chest tightness, wheezing and stridor.   Cardiovascular: Positive for leg swelling. Negative for chest pain and palpitations.  Gastrointestinal: Negative for nausea, vomiting, abdominal pain, diarrhea, constipation, blood in stool and abdominal distention.  Genitourinary: Negative for urgency, hematuria, flank pain, discharge, difficulty urinating and genital sores.  Musculoskeletal: Negative for myalgias, back pain, joint swelling, arthralgias and gait problem.  Skin: Positive for rash and wound.  Neurological: Negative for dizziness, syncope, speech difficulty, weakness, numbness and headaches.  Hematological: Negative for adenopathy. Does not bruise/bleed easily.  Psychiatric/Behavioral: Negative for behavioral problems and dysphoric mood. The patient is not nervous/anxious.        Objective:   Physical Exam  Constitutional: He is oriented to person, place, and time. He appears well-developed.  HENT:  Head: Normocephalic.  Right Ear: External ear normal.  Left Ear: External ear normal.  Eyes: Conjunctivae normal and EOM are normal.  Neck: Normal range of motion.  Cardiovascular: Normal rate and normal heart sounds.        Irregular with a controlled ventricular response  Pulmonary/Chest: Breath sounds normal.       O2 saturation 97  Abdominal: Bowel sounds are normal.  Musculoskeletal: Normal range of motion. He exhibits edema. He exhibits no tenderness.       Bilateral lower extremity edema distal to the knees patient had a bilateral petechial rash involving the upper inner calf areas A small wound involving the left posterior calf noted that was slightly oozing  Neurological: He is alert and oriented to person, place, and time.  Psychiatric: He has a normal mood and affect. His  behavior is normal.          Assessment & Plan:   Lower extremity edema-  multifactorial. The patient has resumed the Lasix will place on a twice a day regimen until the edema has improved and continue once a day dosing low-salt diet recommended elevation of the lower extremities encouraged Chronic atrial fibrillation Venous insufficiency Hypertension stable

## 2012-11-05 ENCOUNTER — Ambulatory Visit: Payer: Medicare Other | Admitting: Internal Medicine

## 2012-11-05 ENCOUNTER — Other Ambulatory Visit: Payer: Self-pay | Admitting: Internal Medicine

## 2012-11-05 DIAGNOSIS — M549 Dorsalgia, unspecified: Secondary | ICD-10-CM

## 2012-11-05 MED ORDER — OXYCODONE HCL 15 MG PO TABS: 15.0000 mg | ORAL_TABLET | Freq: Four times a day (QID) | ORAL | Status: DC | PRN

## 2012-11-05 NOTE — Telephone Encounter (Signed)
Script is ready for pick up on 12-23

## 2012-11-05 NOTE — Telephone Encounter (Signed)
Pt needs new rx oxycodone. Pt will be here on 11-12-12

## 2012-11-12 ENCOUNTER — Ambulatory Visit (INDEPENDENT_AMBULATORY_CARE_PROVIDER_SITE_OTHER): Payer: Medicare Other | Admitting: Family

## 2012-11-12 DIAGNOSIS — I495 Sick sinus syndrome: Secondary | ICD-10-CM

## 2012-11-12 NOTE — Patient Instructions (Addendum)
1 tab (5mg ) a day except 1/2 tab (2.5mg ) on Tuesday and Thursday. Recheck in 3 weeks.    Latest dosing instructions   Total Sun Mon Tue Wed Thu Fri Sat   35 5 mg 5 mg 5 mg 5 mg 5 mg 5 mg 5 mg    (5 mg1) (5 mg1) (5 mg1) (5 mg1) (5 mg1) (5 mg1) (5 mg1)

## 2012-12-03 ENCOUNTER — Ambulatory Visit (INDEPENDENT_AMBULATORY_CARE_PROVIDER_SITE_OTHER): Payer: Medicare Other | Admitting: Family

## 2012-12-03 DIAGNOSIS — I495 Sick sinus syndrome: Secondary | ICD-10-CM

## 2012-12-03 LAB — POCT INR: INR: 2.8

## 2012-12-03 NOTE — Patient Instructions (Addendum)
1 tab (5mg ) a day. Recheck in 4 weeks.    Latest dosing instructions   Total Sun Mon Tue Wed Thu Fri Sat   35 5 mg 5 mg 5 mg 5 mg 5 mg 5 mg 5 mg    (5 mg1) (5 mg1) (5 mg1) (5 mg1) (5 mg1) (5 mg1) (5 mg1)

## 2012-12-11 ENCOUNTER — Emergency Department (HOSPITAL_COMMUNITY): Payer: Medicare Other

## 2012-12-11 ENCOUNTER — Observation Stay (HOSPITAL_COMMUNITY)
Admission: EM | Admit: 2012-12-11 | Discharge: 2012-12-12 | Disposition: A | Discharge status: Home / Self Care | Payer: Medicare Other | Attending: Internal Medicine | Admitting: Internal Medicine

## 2012-12-11 ENCOUNTER — Encounter (HOSPITAL_COMMUNITY): Payer: Self-pay | Admitting: Nurse Practitioner

## 2012-12-11 DIAGNOSIS — L03119 Cellulitis of unspecified part of limb: Secondary | ICD-10-CM

## 2012-12-11 DIAGNOSIS — R1310 Dysphagia, unspecified: Secondary | ICD-10-CM

## 2012-12-11 DIAGNOSIS — K222 Esophageal obstruction: Secondary | ICD-10-CM

## 2012-12-11 DIAGNOSIS — G459 Transient cerebral ischemic attack, unspecified: Principal | ICD-10-CM | POA: Insufficient documentation

## 2012-12-11 DIAGNOSIS — E663 Overweight: Secondary | ICD-10-CM

## 2012-12-11 DIAGNOSIS — I509 Heart failure, unspecified: Secondary | ICD-10-CM

## 2012-12-11 DIAGNOSIS — M109 Gout, unspecified: Secondary | ICD-10-CM

## 2012-12-11 DIAGNOSIS — I4510 Unspecified right bundle-branch block: Secondary | ICD-10-CM

## 2012-12-11 DIAGNOSIS — E785 Hyperlipidemia, unspecified: Secondary | ICD-10-CM | POA: Insufficient documentation

## 2012-12-11 DIAGNOSIS — I495 Sick sinus syndrome: Secondary | ICD-10-CM | POA: Insufficient documentation

## 2012-12-11 DIAGNOSIS — M720 Palmar fascial fibromatosis [Dupuytren]: Secondary | ICD-10-CM

## 2012-12-11 DIAGNOSIS — I4891 Unspecified atrial fibrillation: Secondary | ICD-10-CM | POA: Insufficient documentation

## 2012-12-11 DIAGNOSIS — L570 Actinic keratosis: Secondary | ICD-10-CM

## 2012-12-11 DIAGNOSIS — S139XXA Sprain of joints and ligaments of unspecified parts of neck, initial encounter: Secondary | ICD-10-CM

## 2012-12-11 DIAGNOSIS — F1020 Alcohol dependence, uncomplicated: Secondary | ICD-10-CM | POA: Insufficient documentation

## 2012-12-11 DIAGNOSIS — I739 Peripheral vascular disease, unspecified: Secondary | ICD-10-CM | POA: Insufficient documentation

## 2012-12-11 DIAGNOSIS — E119 Type 2 diabetes mellitus without complications: Secondary | ICD-10-CM | POA: Insufficient documentation

## 2012-12-11 DIAGNOSIS — R4701 Aphasia: Secondary | ICD-10-CM | POA: Insufficient documentation

## 2012-12-11 DIAGNOSIS — R5383 Other fatigue: Secondary | ICD-10-CM

## 2012-12-11 DIAGNOSIS — R55 Syncope and collapse: Secondary | ICD-10-CM

## 2012-12-11 DIAGNOSIS — Z23 Encounter for immunization: Secondary | ICD-10-CM | POA: Insufficient documentation

## 2012-12-11 DIAGNOSIS — M549 Dorsalgia, unspecified: Secondary | ICD-10-CM

## 2012-12-11 DIAGNOSIS — S81809A Unspecified open wound, unspecified lower leg, initial encounter: Secondary | ICD-10-CM

## 2012-12-11 DIAGNOSIS — Z7901 Long term (current) use of anticoagulants: Secondary | ICD-10-CM | POA: Insufficient documentation

## 2012-12-11 DIAGNOSIS — Z950 Presence of cardiac pacemaker: Secondary | ICD-10-CM | POA: Insufficient documentation

## 2012-12-11 DIAGNOSIS — K219 Gastro-esophageal reflux disease without esophagitis: Secondary | ICD-10-CM | POA: Insufficient documentation

## 2012-12-11 DIAGNOSIS — I10 Essential (primary) hypertension: Secondary | ICD-10-CM | POA: Insufficient documentation

## 2012-12-11 DIAGNOSIS — I872 Venous insufficiency (chronic) (peripheral): Secondary | ICD-10-CM | POA: Insufficient documentation

## 2012-12-11 DIAGNOSIS — I679 Cerebrovascular disease, unspecified: Secondary | ICD-10-CM | POA: Insufficient documentation

## 2012-12-11 DIAGNOSIS — F1010 Alcohol abuse, uncomplicated: Secondary | ICD-10-CM

## 2012-12-11 DIAGNOSIS — I8000 Phlebitis and thrombophlebitis of superficial vessels of unspecified lower extremity: Secondary | ICD-10-CM

## 2012-12-11 DIAGNOSIS — Z794 Long term (current) use of insulin: Secondary | ICD-10-CM | POA: Insufficient documentation

## 2012-12-11 DIAGNOSIS — M7500 Adhesive capsulitis of unspecified shoulder: Secondary | ICD-10-CM

## 2012-12-11 LAB — COMPREHENSIVE METABOLIC PANEL
ALT: 12 U/L (ref 0–53)
AST: 16 U/L (ref 0–37)
CO2: 28 mEq/L (ref 19–32)
Calcium: 9.4 mg/dL (ref 8.4–10.5)
Sodium: 132 mEq/L — ABNORMAL LOW (ref 135–145)
Total Protein: 7 g/dL (ref 6.0–8.3)

## 2012-12-11 LAB — APTT: aPTT: 38 seconds — ABNORMAL HIGH (ref 24–37)

## 2012-12-11 LAB — DIFFERENTIAL
Basophils Absolute: 0 10*3/uL (ref 0.0–0.1)
Eosinophils Absolute: 0.3 10*3/uL (ref 0.0–0.7)
Eosinophils Relative: 4 % (ref 0–5)
Lymphocytes Relative: 25 % (ref 12–46)

## 2012-12-11 LAB — CBC
MCV: 88.2 fL (ref 78.0–100.0)
Platelets: 183 10*3/uL (ref 150–400)
RDW: 13.7 % (ref 11.5–15.5)
WBC: 7.6 10*3/uL (ref 4.0–10.5)

## 2012-12-11 LAB — POCT I-STAT, CHEM 8
BUN: 14 mg/dL (ref 6–23)
Chloride: 95 mEq/L — ABNORMAL LOW (ref 96–112)
Creatinine, Ser: 1.1 mg/dL (ref 0.50–1.35)
Potassium: 4.6 mEq/L (ref 3.5–5.1)
Sodium: 134 mEq/L — ABNORMAL LOW (ref 135–145)

## 2012-12-11 LAB — PROTIME-INR
INR: 1.61 — ABNORMAL HIGH (ref 0.00–1.49)
Prothrombin Time: 18.6 seconds — ABNORMAL HIGH (ref 11.6–15.2)

## 2012-12-11 MED ORDER — WARFARIN - PHARMACIST DOSING INPATIENT: Freq: Every day | Status: DC

## 2012-12-11 MED ORDER — WARFARIN SODIUM 2.5 MG PO TABS
2.5000 mg | ORAL_TABLET | ORAL | Status: AC
  Administered 2012-12-11: 2.5 mg via ORAL
  Filled 2012-12-11: qty 1

## 2012-12-11 MED ORDER — INSULIN ASPART 100 UNIT/ML ~~LOC~~ SOLN
0.0000 [IU] | Freq: Three times a day (TID) | SUBCUTANEOUS | Status: DC
  Administered 2012-12-12: 2 [IU] via SUBCUTANEOUS
  Administered 2012-12-12: 5 [IU] via SUBCUTANEOUS

## 2012-12-11 MED ORDER — ADULT MULTIVITAMIN W/MINERALS CH
1.0000 | ORAL_TABLET | Freq: Every day | ORAL | Status: DC
  Administered 2012-12-11 – 2012-12-12 (×2): 1 via ORAL
  Filled 2012-12-11 (×2): qty 1

## 2012-12-11 MED ORDER — LORAZEPAM 2 MG/ML IJ SOLN: 1.0000 mg | Freq: Four times a day (QID) | INTRAMUSCULAR | Status: DC | PRN

## 2012-12-11 MED ORDER — LORAZEPAM 1 MG PO TABS
1.0000 mg | ORAL_TABLET | Freq: Four times a day (QID) | ORAL | Status: DC | PRN
  Administered 2012-12-11: 1 mg via ORAL
  Filled 2012-12-11: qty 1

## 2012-12-11 MED ORDER — OXYCODONE HCL 5 MG PO TABS
15.0000 mg | ORAL_TABLET | Freq: Four times a day (QID) | ORAL | Status: DC | PRN
  Administered 2012-12-11: 15 mg via ORAL
  Filled 2012-12-11: qty 3

## 2012-12-11 MED ORDER — DILTIAZEM HCL ER COATED BEADS 240 MG PO CP24
240.0000 mg | ORAL_CAPSULE | Freq: Every day | ORAL | Status: DC
  Administered 2012-12-11 – 2012-12-12 (×2): 240 mg via ORAL
  Filled 2012-12-11 (×2): qty 1

## 2012-12-11 MED ORDER — THIAMINE HCL 100 MG/ML IJ SOLN
100.0000 mg | Freq: Every day | INTRAMUSCULAR | Status: DC
  Filled 2012-12-11 (×2): qty 1

## 2012-12-11 MED ORDER — FOLIC ACID 1 MG PO TABS
1.0000 mg | ORAL_TABLET | Freq: Every day | ORAL | Status: DC
  Administered 2012-12-11 – 2012-12-12 (×2): 1 mg via ORAL
  Filled 2012-12-11 (×2): qty 1

## 2012-12-11 MED ORDER — PNEUMOCOCCAL VAC POLYVALENT 25 MCG/0.5ML IJ INJ
0.5000 mL | INJECTION | INTRAMUSCULAR | Status: AC
  Administered 2012-12-12: 0.5 mL via INTRAMUSCULAR
  Filled 2012-12-11: qty 0.5

## 2012-12-11 MED ORDER — DIGOXIN 125 MCG PO TABS
125.0000 ug | ORAL_TABLET | Freq: Every day | ORAL | Status: DC
  Administered 2012-12-11 – 2012-12-12 (×2): 125 ug via ORAL
  Filled 2012-12-11 (×2): qty 1

## 2012-12-11 MED ORDER — VITAMIN B-1 100 MG PO TABS
100.0000 mg | ORAL_TABLET | Freq: Every day | ORAL | Status: DC
  Administered 2012-12-11 – 2012-12-12 (×2): 100 mg via ORAL
  Filled 2012-12-11 (×2): qty 1

## 2012-12-11 MED ORDER — ASPIRIN 325 MG PO TABS
325.0000 mg | ORAL_TABLET | Freq: Every day | ORAL | Status: DC
  Administered 2012-12-11 – 2012-12-12 (×2): 325 mg via ORAL
  Filled 2012-12-11 (×2): qty 1

## 2012-12-11 MED ORDER — PANTOPRAZOLE SODIUM 40 MG PO TBEC
40.0000 mg | DELAYED_RELEASE_TABLET | Freq: Every day | ORAL | Status: DC
  Administered 2012-12-11 – 2012-12-12 (×2): 40 mg via ORAL
  Filled 2012-12-11 (×2): qty 1

## 2012-12-11 MED ORDER — INSULIN GLARGINE 100 UNIT/ML ~~LOC~~ SOLN
20.0000 [IU] | Freq: Every day | SUBCUTANEOUS | Status: DC
  Administered 2012-12-11: 20 [IU] via SUBCUTANEOUS

## 2012-12-11 MED ORDER — SODIUM CHLORIDE 0.9 % IV SOLN
INTRAVENOUS | Status: DC
  Administered 2012-12-11: 20 mL/h via INTRAVENOUS

## 2012-12-11 NOTE — Consult Note (Signed)
Referring Physician: Toniann Fail    Chief Complaint: Episode of difficulty with speech  HPI: [name] is an 78 y.o. male with a history of atrial fibrillation on Coumadin who reports that today while having brunch with his wife he experienced an episode of difficulty with speech.  He reports that he acutely became unable to get his words out.  His wife reports what was coming out was jiberish.  After about 30 minutes he was able to get some words out but was unable to remember things like his daughters' names, friends' names, etc.  Patient feels he was back to baseline after about an hour. Patient reports that he is compliant with his Coumadin but may have missed taking it yesterday.    LSN: [id number] tPA Given: No: Resolution of symptoms  Past Medical History  Diagnosis Date  . Fatigue   . Overweight   . Syncope and collapse   . GERD (gastroesophageal reflux disease)   . Right bundle branch block   . Unspecified venous (peripheral) insufficiency   . Phlebitis and thrombophlebitis of superficial vessels of lower extremities   . Cellulitis and abscess of leg, except foot   . Backache, unspecified   . Long term (current) use of anticoagulants   . Type II or unspecified type diabetes mellitus without mention of complication, not stated as uncontrolled   . Unspecified venous (peripheral) insufficiency   . Hypertension   . Gout, unspecified   . Heart failure   . Atrial fibrillation     permanent  . Esophageal stricture   . Hx of colonic polyps 05-2006    (Adenomatous)Dr. Arlyce Dice  . Diverticulosis 05-2006    Dr. Arlyce Dice   . Tachycardia-bradycardia     s/p PPM    Past Surgical History  Procedure Date  . Laminectomy   . Lumbar fusion   . Rotator cuff repair   . Ruptured rt rectus muscle   . Eye surgery   . Pacemaker insertion 8/12    SJM by Dr Johney Frame for tachy/brady syndrome    Family History  Problem Relation Age of Onset  . Pneumonia Mother   . Heart disease Father   .  Diabetes Father   . Colon cancer Neg Hx    Social History:  reports that he has never smoked. He has never used smokeless tobacco. He reports that he drinks alcohol. He reports that he does not use illicit drugs.  Allergies:  Allergies  Allergen Reactions  . Morphine Rash    REACTION:irritability    Medications:  I have reviewed the patient's current medications. Prior to Admission:  Prescriptions prior to admission  Medication Sig Dispense Refill  . digoxin (LANOXIN) 0.125 MG tablet Take 125 mcg by mouth daily.      Marland Kitchen diltiazem (CARDIZEM CD) 240 MG 24 hr capsule Take 240 mg by mouth daily.      Marland Kitchen etodolac (LODINE) 300 MG capsule Take 300 mg by mouth 2 (two) times daily.      . furosemide (LASIX) 40 MG tablet Take 1 tablet (40 mg total) by mouth daily.  60 tablet  6  . insulin aspart (NOVOLOG) 100 UNIT/ML injection Inject 5-10 Units into the skin 3 (three) times daily before meals. SLIDING SCALE: >300:10U, 200-300: 5U, <200:no insulin      . insulin glargine (LANTUS) 100 UNIT/ML injection Inject 20 Units into the skin at bedtime.       . metFORMIN (GLUCOPHAGE) 500 MG tablet Take 500 mg by mouth 2 (  two) times daily with a meal.      . omeprazole (PRILOSEC) 20 MG capsule Take 20 mg by mouth 2 (two) times daily.      Marland Kitchen oxyCODONE (ROXICODONE) 15 MG immediate release tablet Take 1 tablet (15 mg total) by mouth every 6 (six) hours as needed for pain.  120 tablet  0  . warfarin (COUMADIN) 5 MG tablet Take 5 mg by mouth daily.       Scheduled:   . aspirin  325 mg Oral Daily  . digoxin  125 mcg Oral Daily  . diltiazem  240 mg Oral Daily  . folic acid  1 mg Oral Daily  . insulin aspart  0-9 Units Subcutaneous TID WC  . insulin glargine  20 Units Subcutaneous QHS  . multivitamin with minerals  1 tablet Oral Daily  . pantoprazole  40 mg Oral Daily  . thiamine  100 mg Oral Daily   Or  . thiamine  100 mg Intravenous Daily    ROS: History obtained from the patient  General ROS:  negative for - chills, fatigue, fever, night sweats, weight gain or weight loss Psychological ROS: negative for - behavioral disorder, hallucinations, memory difficulties, mood swings or suicidal ideation Ophthalmic ROS: negative for - blurry vision, double vision, eye pain or loss of vision ENT ROS: negative for - epistaxis, nasal discharge, oral lesions, sore throat, tinnitus or vertigo Allergy and Immunology ROS: negative for - hives or itchy/watery eyes Hematological and Lymphatic ROS: negative for - bleeding problems, bruising or swollen lymph nodes Endocrine ROS: negative for - galactorrhea, hair pattern changes, polydipsia/polyuria or temperature intolerance Respiratory ROS: negative for - cough, hemoptysis, shortness of breath or wheezing Cardiovascular ROS: negative for - chest pain, dyspnea on exertion, edema or irregular heartbeat Gastrointestinal ROS: negative for - abdominal pain, diarrhea, hematemesis, nausea/vomiting or stool incontinence Genito-Urinary ROS: negative for - dysuria, hematuria, incontinence or urinary frequency/urgency Musculoskeletal ROS: shoulder pain Neurological ROS: as noted in HPI Dermatological ROS: negative for rash and skin lesion changes  Physical Examination: Blood pressure 158/71, pulse 83, temperature 98.5 F (36.9 C), temperature source Oral, resp. rate 20, SpO2 100.00%.  Neurologic Examination: Mental Status: Alert, oriented, thought content appropriate.  Speech fluent without evidence of aphasia.  Able to follow 3 step commands without difficulty. Cranial Nerves: II: Discs flat bilaterally; Visual fields grossly normal, pupils equal, round, reactive to light and accommodation III,IV, VI: ptosis not present, extra-ocular motions intact bilaterally V,VII: smile symmetric, facial light touch sensation normal bilaterally VIII: hearing normal bilaterally IX,X: gag reflex present XI: bilateral shoulder shrug XII: midline tongue  extension Motor: Right : Upper extremity   5/5 distally with 4-/5 weakness at the shoulder 2/2 pain  Left:     Upper extremity   5/5  Lower extremity   5/5          Lower extremity   5/5 Tone and bulk:normal tone throughout; no atrophy noted Sensory: Pinprick and light touch intact throughout, bilaterally Deep Tendon Reflexes: 2+ in the upper extremities, trace at the knees and absent at the ankles Plantars: Right: mute   Left: mute Cerebellar: normal finger-to-nose and normal heel-to-shin test Gait: not tested CV: pulses palpable throughout   Laboratory Studies:  Basic Metabolic Panel:  Lab 12/11/12 1610 12/11/12 1648  NA 134* 132*  K 4.6 4.5  CL 95* 92*  CO2 -- 28  GLUCOSE 287* 305*  BUN 14 14  CREATININE 1.10 0.99  CALCIUM -- 9.4  MG -- --  PHOS -- --    Liver Function Tests:  Lab 12/11/12 1648  AST 16  ALT 12  ALKPHOS 104  BILITOT 0.8  PROT 7.0  ALBUMIN 3.4*   No results found for this basename: LIPASE:5,AMYLASE:5 in the last 168 hours No results found for this basename: AMMONIA:3 in the last 168 hours  CBC:  Lab 12/11/12 1721 12/11/12 1648  WBC -- 7.6  NEUTROABS -- 5.0  HGB 12.9* 12.5*  HCT 38.0* 36.6*  MCV -- 88.2  PLT -- 183    Cardiac Enzymes:  Lab 12/11/12 1649  CKTOTAL --  CKMB --  CKMBINDEX --  TROPONINI <0.30    BNP: No components found with this basename: POCBNP:5  CBG:  Lab 12/11/12 1651  GLUCAP 301*    Microbiology: Results for orders placed during the hospital encounter of 07/04/11  MRSA PCR SCREENING     Status: Normal   Collection Time   07/05/11  4:35 AM      Component Value Range Status Comment   MRSA by PCR NEGATIVE  NEGATIVE Final     Coagulation Studies:  Basename 12/11/12 1648  LABPROT 18.6*  INR 1.61*    Urinalysis: No results found for this basename: COLORURINE:2,APPERANCEUR:2,LABSPEC:2,PHURINE:2,GLUCOSEU:2,HGBUR:2,BILIRUBINUR:2,KETONESUR:2,PROTEINUR:2,UROBILINOGEN:2,NITRITE:2,LEUKOCYTESUR:2 in the last  168 hours  Lipid Panel:    Component Value Date/Time   CHOL 197 01/20/2012 1310   TRIG 251.0* 01/20/2012 1310   HDL 38.80* 01/20/2012 1310   CHOLHDL 5 01/20/2012 1310   VLDL 50.2* 01/20/2012 1310   LDLCALC 100* 07/05/2011 0725    HgbA1C:  Lab Results  Component Value Date   HGBA1C 8.0* 10/15/2012    Urine Drug Screen:     Component Value Date/Time   LABOPIA NEGATIVE 07/05/2011 1009   COCAINSCRNUR NEGATIVE 07/05/2011 1009   LABBENZ NEGATIVE 07/05/2011 1009   AMPHETMU NEGATIVE 07/05/2011 1009    Alcohol Level: No results found for this basename: ETH:2 in the last 168 hours  Imaging: Ct Head (brain) Wo Contrast  12/11/2012  *RADIOLOGY REPORT*  Clinical Data: 78 year old male with slurred speech.  CT HEAD WITHOUT CONTRAST  Technique:  Contiguous axial images were obtained from the base of the skull through the vertex without contrast.  Comparison: 07/04/2011 and prior head CTs  Findings: Atrophy and chronic small vessel white matter ischemic changes are again noted.  No acute intracranial abnormalities are identified, including mass lesion or mass effect, hydrocephalus, extra-axial fluid collection, midline shift, hemorrhage, or acute infarction.  The visualized bony calvarium is unremarkable.  IMPRESSION: No evidence of acute intracranial abnormality.  Atrophy and chronic small vessel white matter ischemic changes.   Original Report Authenticated By: Harmon Pier, M.D.     Assessment: 78 y.o. male presenting with an episode of expressive aphasia that has resolved.  Patient with multiple stroke risk factors.  On Coumadin for atrial fibrillation.  INR subtherapeutic.  Embolic cause likely.    Stroke Risk Factors - atrial fibrillation, diabetes mellitus and hypertension  Plan: 1. HgbA1c, fasting lipid panel 2. MRI, MRA  of the brain unable to be performed secondary to pacemaker.  Would repeat head CT in 48 hours 3. Echocardiogram 4. Carotid dopplers 5. Prophylactic therapy- Continue Coumadin with  target INR between 2 and 3 6. Risk factor modification 7. Telemetry monitoring 8. Frequent neuro checks  Thana Farr, MD [facility] [phone] 12/11/2012, 9:30 PM

## 2012-12-11 NOTE — Progress Notes (Signed)
ANTICOAGULATION CONSULT NOTE - Initial Consult  Pharmacy Consult for Coumadin Indication: atrial fibrillation  Allergies  Allergen Reactions  . Morphine Rash    REACTION:irritability   Vital Signs: Temp: 98.5 F (36.9 C) (01/21 1744) Temp src: Oral (01/21 1620) BP: 158/71 mmHg (01/21 2045) Pulse Rate: 83  (01/21 2045)  Labs:  Basename 12/11/12 1721 12/11/12 1649 12/11/12 1648  HGB 12.9* -- 12.5*  HCT 38.0* -- 36.6*  PLT -- -- 183  APTT -- -- 38*  LABPROT -- -- 18.6*  INR -- -- 1.61*  HEPARINUNFRC -- -- --  CREATININE 1.10 -- 0.99  CKTOTAL -- -- --  CKMB -- -- --  TROPONINI -- <0.30 --    The CrCl is unknown because both a height and weight (above a minimum accepted value) are required for this calculation.   Medical History: Past Medical History  Diagnosis Date  . Fatigue   . Overweight   . Syncope and collapse   . GERD (gastroesophageal reflux disease)   . Right bundle branch block   . Unspecified venous (peripheral) insufficiency   . Phlebitis and thrombophlebitis of superficial vessels of lower extremities   . Cellulitis and abscess of leg, except foot   . Backache, unspecified   . Long term (current) use of anticoagulants   . Type II or unspecified type diabetes mellitus without mention of complication, not stated as uncontrolled   . Unspecified venous (peripheral) insufficiency   . Hypertension   . Gout, unspecified   . Heart failure   . Atrial fibrillation     permanent  . Esophageal stricture   . Hx of colonic polyps 05-2006    (Adenomatous)Dr. Arlyce Dice  . Diverticulosis 05-2006    Dr. Arlyce Dice   . Tachycardia-bradycardia     s/p PPM    Medications:  Prescriptions prior to admission  Medication Sig Dispense Refill  . digoxin (LANOXIN) 0.125 MG tablet Take 125 mcg by mouth daily.      Marland Kitchen diltiazem (CARDIZEM CD) 240 MG 24 hr capsule Take 240 mg by mouth daily.      Marland Kitchen etodolac (LODINE) 300 MG capsule Take 300 mg by mouth 2 (two) times daily.        . furosemide (LASIX) 40 MG tablet Take 1 tablet (40 mg total) by mouth daily.  60 tablet  6  . insulin aspart (NOVOLOG) 100 UNIT/ML injection Inject 5-10 Units into the skin 3 (three) times daily before meals. SLIDING SCALE: >300:10U, 200-300: 5U, <200:no insulin      . insulin glargine (LANTUS) 100 UNIT/ML injection Inject 20 Units into the skin at bedtime.       . metFORMIN (GLUCOPHAGE) 500 MG tablet Take 500 mg by mouth 2 (two) times daily with a meal.      . omeprazole (PRILOSEC) 20 MG capsule Take 20 mg by mouth 2 (two) times daily.      Marland Kitchen oxyCODONE (ROXICODONE) 15 MG immediate release tablet Take 1 tablet (15 mg total) by mouth every 6 (six) hours as needed for pain.  120 tablet  0  . warfarin (COUMADIN) 5 MG tablet Take 5 mg by mouth daily.        Assessment: 78 y/o male who presented to the ED with confusion and garbled speech admitted with a TIA. He is on Coumadin at home for Afib. INR is subtherapeutic at 1.61.   Home dose is 5 mg daily. He has already taken dose for today per PTA med list.  Goal of Therapy:  INR 2-3 Monitor platelets by anticoagulation protocol: Yes   Plan:  -Coumadin 2.5 mg po extra tonight (7.5 mg total today) -INR daily -Monitor for s/sx of bleeding  [facility], [address].D., BCPS Clinical Pharmacist Pager: [phone] 12/11/2012 9:27 PM

## 2012-12-11 NOTE — ED Notes (Signed)
Patient transported to CT 

## 2012-12-11 NOTE — ED Provider Notes (Signed)
History     CSN: [id number]  Arrival date & time 12/11/12  1615   First MD Initiated Contact with Patient 12/11/12 1719      Chief Complaint  Patient presents with  . Aphasia    (Consider location/radiation/quality/duration/timing/severity/associated sxs/prior treatment) HPI.... confusion and garbled speech from 2:30 to 3:30 PM this afternoon. Symptoms have now cleared.  This has never happened before.  Patient has multiple vascular risk factors including diabetes, hypertension, atrial fibrillation.  Nothing makes symptoms better or worse. Severity is moderate. Wife reports normal behavior now. No arm or leg weakness, drooping face.    Past Medical History  Diagnosis Date  . Fatigue   . Overweight   . Syncope and collapse   . GERD (gastroesophageal reflux disease)   . Right bundle branch block   . Unspecified venous (peripheral) insufficiency   . Phlebitis and thrombophlebitis of superficial vessels of lower extremities   . Cellulitis and abscess of leg, except foot   . Backache, unspecified   . Long term (current) use of anticoagulants   . Type II or unspecified type diabetes mellitus without mention of complication, not stated as uncontrolled   . Unspecified venous (peripheral) insufficiency   . Hypertension   . Gout, unspecified   . Heart failure   . Atrial fibrillation     permanent  . Esophageal stricture   . Hx of colonic polyps 05-2006    (Adenomatous)Dr. Arlyce Dice  . Diverticulosis 05-2006    Dr. Arlyce Dice   . Tachycardia-bradycardia     s/p PPM    Past Surgical History  Procedure Date  . Laminectomy   . Lumbar fusion   . Rotator cuff repair   . Ruptured rt rectus muscle   . Eye surgery   . Pacemaker insertion 8/12    SJM by Dr Johney Frame for tachy/brady syndrome    Family History  Problem Relation Age of Onset  . Pneumonia Mother   . Heart disease Father   . Diabetes Father   . Colon cancer Neg Hx     History  Substance Use Topics  . Smoking status:  Never Smoker   . Smokeless tobacco: Never Used  . Alcohol Use: Yes     Comment: two drinks daily       Review of Systems  All other systems reviewed and are negative.    Allergies  Morphine  Home Medications   Current Outpatient Rx  Name  Route  Sig  Dispense  Refill  . DIGOXIN 0.125 MG PO TABS   Oral   Take 125 mcg by mouth daily.         Marland Kitchen DILTIAZEM HCL ER COATED BEADS 240 MG PO CP24   Oral   Take 240 mg by mouth daily.         . ETODOLAC 300 MG PO CAPS   Oral   Take 300 mg by mouth 2 (two) times daily.         . FUROSEMIDE 40 MG PO TABS   Oral   Take 1 tablet (40 mg total) by mouth daily.   60 tablet   6   . INSULIN ASPART 100 UNIT/ML [place] SOLN   Subcutaneous   Inject 5-10 Units into the skin 3 (three) times daily before meals. SLIDING SCALE: >300:10U, 200-300: 5U, <200:no insulin         . INSULIN GLARGINE 100 UNIT/ML [place] SOLN   Subcutaneous   Inject 20 Units into the skin at bedtime.          Marland Kitchen  METFORMIN HCL 500 MG PO TABS   Oral   Take 500 mg by mouth 2 (two) times daily with a meal.         . OMEPRAZOLE 20 MG PO CPDR   Oral   Take 20 mg by mouth 2 (two) times daily.         . OXYCODONE HCL 15 MG PO TABS   Oral   Take 1 tablet (15 mg total) by mouth every 6 (six) hours as needed for pain.   120 tablet   0   . WARFARIN SODIUM 5 MG PO TABS   Oral   Take 5 mg by mouth daily.           BP 167/66  Pulse 74  Temp 98.5 F (36.9 C) (Oral)  Resp 17  SpO2 99%  Physical Exam  Nursing note and vitals reviewed. Constitutional: He is oriented to person, place, and time. He appears well-developed and well-nourished.  HENT:  Head: Normocephalic and atraumatic.  Eyes: Conjunctivae normal and EOM are normal. Pupils are equal, round, and reactive to light.  Neck: Normal range of motion. Neck supple.  Cardiovascular: Normal rate and normal heart sounds.        Irregularly irregular  Pulmonary/Chest: Effort normal and breath sounds  normal.  Abdominal: Soft. Bowel sounds are normal.  Musculoskeletal: Normal range of motion.  Neurological: He is alert and oriented to person, place, and time.  Skin: Skin is warm and dry.  Psychiatric: He has a normal mood and affect.    ED Course  Procedures (including critical care time)  Labs Reviewed  PROTIME-INR - Abnormal; Notable for the following:    Prothrombin Time 18.6 (*)     INR 1.61 (*)     All other components within normal limits  APTT - Abnormal; Notable for the following:    aPTT 38 (*)     All other components within normal limits  CBC - Abnormal; Notable for the following:    RBC 4.15 (*)     Hemoglobin 12.5 (*)     HCT 36.6 (*)     All other components within normal limits  COMPREHENSIVE METABOLIC PANEL - Abnormal; Notable for the following:    Sodium 132 (*)     Chloride 92 (*)     Glucose, Bld 305 (*)     Albumin 3.4 (*)     GFR calc non Af Amer 78 (*)     All other components within normal limits  GLUCOSE, CAPILLARY - Abnormal; Notable for the following:    Glucose-Capillary 301 (*)     All other components within normal limits  POCT I-STAT, CHEM 8 - Abnormal; Notable for the following:    Sodium 134 (*)     Chloride 95 (*)     Glucose, Bld 287 (*)     Hemoglobin 12.9 (*)     HCT 38.0 (*)     All other components within normal limits  DIFFERENTIAL  TROPONIN I  POCT I-STAT TROPONIN I   Ct Head (brain) Wo Contrast  12/11/2012  *RADIOLOGY REPORT*  Clinical Data: 78 year old male with slurred speech.  CT HEAD WITHOUT CONTRAST  Technique:  Contiguous axial images were obtained from the base of the skull through the vertex without contrast.  Comparison: 07/04/2011 and prior head CTs  Findings: Atrophy and chronic small vessel white matter ischemic changes are again noted.  No acute intracranial abnormalities are identified, including mass lesion or mass effect, hydrocephalus,  extra-axial fluid collection, midline shift, hemorrhage, or acute  infarction.  The visualized bony calvarium is unremarkable.  IMPRESSION: No evidence of acute intracranial abnormality.  Atrophy and chronic small vessel white matter ischemic changes.   Original Report Authenticated By: Harmon Pier, M.D.      No diagnosis found.   Date: 12/11/2012  Rate: 93  Rhythm: atrial fibrillation  QRS Axis: normal  Intervals: normal  ST/T Wave abnormalities: inverted T wave laterally  Conduction Disutrbances:right bundle branch block  Narrative Interpretation:   Old EKG Reviewed: changes noted   MDM  History and physical consistent with TIA.   INR pending.   discussed with neurologist and hospitalist. Admit to observation        Donnetta Hutching, MD 12/11/12 2004

## 2012-12-11 NOTE — ED Notes (Addendum)
At 1530 pt reports not being able to make sense of words. Per wife he was making sounds that were nonsense. He has resolved since. Denies weakness, left arm shaky. No facial droop, perrla, no drift. Answers questions appropriately, no slurred speech. Pt currently takes Warfarin

## 2012-12-11 NOTE — H&P (Signed)
[name] is an 78 y.o. male.  Patient was seen and examined on December 11, 2012. PCP - Dr. Darryll Capers. Chief Complaint: Difficulty speaking. HPI: 78 year old male with history of atrial fibrillation/tachybradycardia syndrome status post pacemaker placement on Coumadin, hypertension and diabetes mellitus type 2 started experiencing difficulty speaking around 3 PM today. Patient was not able to bring out words. Denies any associated headache visual changes focal deficits loss of consciousness. Episode lasted for one hour and resolved spontaneously. By the time patient reached ER he did not have any focal deficits. CT head was negative for any acute. Patient has been admitted for further management.  Past Medical History  Diagnosis Date  . Fatigue   . Overweight   . Syncope and collapse   . GERD (gastroesophageal reflux disease)   . Right bundle branch block   . Unspecified venous (peripheral) insufficiency   . Phlebitis and thrombophlebitis of superficial vessels of lower extremities   . Cellulitis and abscess of leg, except foot   . Backache, unspecified   . Long term (current) use of anticoagulants   . Type II or unspecified type diabetes mellitus without mention of complication, not stated as uncontrolled   . Unspecified venous (peripheral) insufficiency   . Hypertension   . Gout, unspecified   . Heart failure   . Atrial fibrillation     permanent  . Esophageal stricture   . Hx of colonic polyps 05-2006    (Adenomatous)Dr. Arlyce Dice  . Diverticulosis 05-2006    Dr. Arlyce Dice   . Tachycardia-bradycardia     s/p PPM    Past Surgical History  Procedure Date  . Laminectomy   . Lumbar fusion   . Rotator cuff repair   . Ruptured rt rectus muscle   . Eye surgery   . Pacemaker insertion 8/12    SJM by Dr Johney Frame for tachy/brady syndrome    Family History  Problem Relation Age of Onset  . Pneumonia Mother   . Heart disease Father   . Diabetes Father   . Colon cancer Neg  Hx    Social History:  reports that he has never smoked. He has never used smokeless tobacco. He reports that he drinks alcohol. He reports that he does not use illicit drugs.  Allergies:  Allergies  Allergen Reactions  . Morphine Rash    REACTION:irritability     (Not in a hospital admission)  Results for orders placed during the hospital encounter of 12/11/12 (from the past 48 hour(s))  PROTIME-INR     Status: Abnormal   Collection Time   12/11/12  4:48 PM      Component Value Range Comment   Prothrombin Time 18.6 (*) 11.6 - 15.2 seconds    INR 1.61 (*) 0.00 - 1.49   APTT     Status: Abnormal   Collection Time   12/11/12  4:48 PM      Component Value Range Comment   aPTT 38 (*) 24 - 37 seconds   CBC     Status: Abnormal   Collection Time   12/11/12  4:48 PM      Component Value Range Comment   WBC 7.6  4.0 - 10.5 K/uL    RBC 4.15 (*) 4.22 - 5.81 MIL/uL    Hemoglobin 12.5 (*) 13.0 - 17.0 g/dL    HCT 40.9 (*) 81.1 - 52.0 %    MCV 88.2  78.0 - 100.0 fL    MCH 30.1  26.0 - 34.0 pg  MCHC 34.2  30.0 - 36.0 g/dL    RDW 98.1  19.1 - 47.8 %    Platelets 183  150 - 400 K/uL   DIFFERENTIAL     Status: Normal   Collection Time   12/11/12  4:48 PM      Component Value Range Comment   Neutrophils Relative 66  43 - 77 %    Neutro Abs 5.0  1.7 - 7.7 K/uL    Lymphocytes Relative 25  12 - 46 %    Lymphs Abs 1.9  0.7 - 4.0 K/uL    Monocytes Relative 5  3 - 12 %    Monocytes Absolute 0.4  0.1 - 1.0 K/uL    Eosinophils Relative 4  0 - 5 %    Eosinophils Absolute 0.3  0.0 - 0.7 K/uL    Basophils Relative 1  0 - 1 %    Basophils Absolute 0.0  0.0 - 0.1 K/uL   COMPREHENSIVE METABOLIC PANEL     Status: Abnormal   Collection Time   12/11/12  4:48 PM      Component Value Range Comment   Sodium 132 (*) 135 - 145 mEq/L    Potassium 4.5  3.5 - 5.1 mEq/L    Chloride 92 (*) 96 - 112 mEq/L    CO2 28  19 - 32 mEq/L    Glucose, Bld 305 (*) 70 - 99 mg/dL    BUN 14  6 - 23 mg/dL     Creatinine, Ser 2.95  0.50 - 1.35 mg/dL    Calcium 9.4  8.4 - 62.1 mg/dL    Total Protein 7.0  6.0 - 8.3 g/dL    Albumin 3.4 (*) 3.5 - 5.2 g/dL    AST 16  0 - 37 U/L    ALT 12  0 - 53 U/L    Alkaline Phosphatase 104  39 - 117 U/L    Total Bilirubin 0.8  0.3 - 1.2 mg/dL    GFR calc non Af Amer 78 (*) >90 mL/min    GFR calc Af Amer >90  >90 mL/min   TROPONIN I     Status: Normal   Collection Time   12/11/12  4:49 PM      Component Value Range Comment   Troponin I <0.30  <0.30 ng/mL   GLUCOSE, CAPILLARY     Status: Abnormal   Collection Time   12/11/12  4:51 PM      Component Value Range Comment   Glucose-Capillary 301 (*) 70 - 99 mg/dL   POCT I-STAT, CHEM 8     Status: Abnormal   Collection Time   12/11/12  5:21 PM      Component Value Range Comment   Sodium 134 (*) 135 - 145 mEq/L    Potassium 4.6  3.5 - 5.1 mEq/L    Chloride 95 (*) 96 - 112 mEq/L    BUN 14  6 - 23 mg/dL    Creatinine, Ser 3.08  0.50 - 1.35 mg/dL    Glucose, Bld 657 (*) 70 - 99 mg/dL    Calcium, Ion 8.46  9.62 - 1.30 mmol/L    TCO2 29  0 - 100 mmol/L    Hemoglobin 12.9 (*) 13.0 - 17.0 g/dL    HCT 95.2 (*) 84.1 - 52.0 %   POCT I-STAT TROPONIN I     Status: Normal   Collection Time   12/11/12  5:22 PM      Component Value Range  Comment   Troponin i, poc 0.01  0.00 - 0.08 ng/mL    Comment 3             Ct Head (brain) Wo Contrast  12/11/2012  *RADIOLOGY REPORT*  Clinical Data: 78 year old male with slurred speech.  CT HEAD WITHOUT CONTRAST  Technique:  Contiguous axial images were obtained from the base of the skull through the vertex without contrast.  Comparison: 07/04/2011 and prior head CTs  Findings: Atrophy and chronic small vessel white matter ischemic changes are again noted.  No acute intracranial abnormalities are identified, including mass lesion or mass effect, hydrocephalus, extra-axial fluid collection, midline shift, hemorrhage, or acute infarction.  The visualized bony calvarium is unremarkable.   IMPRESSION: No evidence of acute intracranial abnormality.  Atrophy and chronic small vessel white matter ischemic changes.   Original Report Authenticated By: Harmon Pier, M.D.     Review of Systems  Constitutional: Negative.   HENT: Negative.   Eyes: Negative.   Respiratory: Negative.   Cardiovascular: Negative.   Gastrointestinal: Negative.   Genitourinary: Negative.   Musculoskeletal: Negative.   Skin: Negative.   Neurological: Positive for speech change.  Endo/Heme/Allergies: Negative.   Psychiatric/Behavioral: Negative.     Blood pressure 167/66, pulse 74, temperature 98.5 F (36.9 C), temperature source Oral, resp. rate 17, SpO2 99.00%. Physical Exam  Constitutional: He is oriented to person, place, and time. He appears well-developed and well-nourished. No distress.  HENT:  Head: Normocephalic and atraumatic.  Right Ear: External ear normal.  Left Ear: External ear normal.  Nose: Nose normal.  Mouth/Throat: Oropharynx is clear and moist. No oropharyngeal exudate.  Eyes: Conjunctivae normal are normal. Pupils are equal, round, and reactive to light. Right eye exhibits no discharge. Left eye exhibits no discharge. No scleral icterus.  Neck: Normal range of motion. Neck supple.  Cardiovascular: Normal rate and regular rhythm.   Respiratory: Effort normal and breath sounds normal. No respiratory distress. He has no wheezes. He has no rales.  GI: Soft. Bowel sounds are normal. He exhibits no distension. There is no tenderness. There is no rebound.  Musculoskeletal: He exhibits no edema and no tenderness.  Neurological: He is alert and oriented to person, place, and time.       Moves all extremities 5/5. No facial asymmetry. Tongue is midline.  Skin: Skin is warm and dry. He is not diaphoretic.  Psychiatric: His behavior is normal.     Assessment/Plan #1. TIA - patient will be placed on neurochecks. Carotid Doppler and 2-D echo. Swallow evaluation. Since patient has  pacemaker MRI cannot be done. Further recommendations per neurologist. #2. Atrial fibrillation and tachybradycardia syndrome status post pacemaker placement on Coumadin - Coumadin per pharmacy. #3. Hypertension - continue present medications. #4. Diabetes mellitus type 2 - sliding scale coverage. #5. Alcoholism - patient drinks vodka every day. Patient will be placed on when necessary Ativan.  CODE STATUS - full code.  Threasa Kinch N. 12/11/2012, 8:25 PM

## 2012-12-12 DIAGNOSIS — I059 Rheumatic mitral valve disease, unspecified: Secondary | ICD-10-CM

## 2012-12-12 DIAGNOSIS — E785 Hyperlipidemia, unspecified: Secondary | ICD-10-CM | POA: Diagnosis present

## 2012-12-12 DIAGNOSIS — I10 Essential (primary) hypertension: Secondary | ICD-10-CM

## 2012-12-12 LAB — LIPID PANEL
HDL: 39 mg/dL — ABNORMAL LOW (ref >39)
LDL Cholesterol: 113 mg/dL — ABNORMAL HIGH (ref 0–99)
Triglycerides: 171 mg/dL — ABNORMAL HIGH (ref <150)
VLDL: 34 mg/dL (ref 0–40)

## 2012-12-12 LAB — GLUCOSE, CAPILLARY: Glucose-Capillary: 284 mg/dL — ABNORMAL HIGH (ref 70–99)

## 2012-12-12 LAB — PROTIME-INR
INR: 1.75 — ABNORMAL HIGH (ref 0.00–1.49)
Prothrombin Time: 19.8 seconds — ABNORMAL HIGH (ref 11.6–15.2)

## 2012-12-12 MED ORDER — DABIGATRAN ETEXILATE MESYLATE 150 MG PO CAPS
150.0000 mg | ORAL_CAPSULE | Freq: Two times a day (BID) | ORAL | Status: DC
  Administered 2012-12-12: 150 mg via ORAL
  Filled 2012-12-12 (×2): qty 1

## 2012-12-12 MED ORDER — ATORVASTATIN CALCIUM 10 MG PO TABS
10.0000 mg | ORAL_TABLET | Freq: Every day | ORAL | Status: DC
  Filled 2012-12-12: qty 1

## 2012-12-12 MED ORDER — UNABLE TO FIND: Status: DC

## 2012-12-12 MED ORDER — FOLIC ACID 1 MG PO TABS: 1.0000 mg | ORAL_TABLET | Freq: Every day | ORAL | Status: DC

## 2012-12-12 MED ORDER — ATORVASTATIN CALCIUM 10 MG PO TABS: 10.0000 mg | ORAL_TABLET | Freq: Every day | ORAL | Status: DC

## 2012-12-12 MED ORDER — THIAMINE HCL 100 MG PO TABS: 100.0000 mg | ORAL_TABLET | Freq: Every day | ORAL | Status: DC

## 2012-12-12 MED ORDER — DABIGATRAN ETEXILATE MESYLATE 150 MG PO CAPS: 150.0000 mg | ORAL_CAPSULE | Freq: Two times a day (BID) | ORAL | Status: DC

## 2012-12-12 NOTE — Progress Notes (Signed)
Inpatient Diabetes Program Recommendations  AACE/ADA: New Consensus Statement on Inpatient Glycemic Control (2013)  Target Ranges:  Prepandial:   less than 140 mg/dL      Peak postprandial:   less than 180 mg/dL (1-2 hours)      Critically ill patients:  140 - 180 mg/dL   Reason for Visit: Hyperglycemia and Elevated HGBA53C  78 year old male with history of atrial fibrillation/tachybradycardia syndrome status post pacemaker placement on Coumadin, hypertension and diabetes mellitus type 2 started experiencing difficulty speaking yesterday.    Results for MAGIC, MOHLER (MRN [id number]) as of 12/12/2012 09:55  Ref. Range 12/11/2012 16:51 12/11/2012 21:20 12/12/2012 07:40  Glucose-Capillary Latest Range: 70-99 mg/dL 956 (H) 213 (H) 086 (H)  Results for KAYNEN, MINNER (MRN [id number]) as of 12/12/2012 09:55  Ref. Range 12/11/2012 17:21  TCO2 Latest Range: 0-100 mmol/L 29  Sodium Latest Range: 135-145 mEq/L 134 (L)  Potassium Latest Range: 3.5-5.1 mEq/L 4.6  Chloride Latest Range: 96-112 mEq/L 95 (L)  BUN Latest Range: 6-23 mg/dL 14  Creatinine Latest Range: 0.50-1.35 mg/dL 5.28  Glucose Latest Range: 70-99 mg/dL 413 (H)  Calcium Ionized Latest Range: 1.13-1.30 mmol/L 1.16  Results for JAROLD, MACOMBER (MRN [id number]) as of 12/12/2012 09:55  Ref. Range 12/11/2012 21:57  Hemoglobin A1C Latest Range: <5.7 % 8.5 (H)   Inpatient Diabetes Program Recommendations Insulin - Meal Coverage: Add meal coverage insulin - Novolog 3 units [id number]  Note: Will follow.  Thank you. Ailene Ards, RD, LDN, CDE Inpatient Diabetes Coordinator [phone]

## 2012-12-12 NOTE — Progress Notes (Signed)
[facility] PROGRESS NOTE  [name] JXB:[id number] DOB: 1935/04/08 DOA: 12/11/2012 PCP: Carrie Mew, MD  Assessment/Plan: TIA Patient presented with difficulty speaking, symptoms resolved. Head CT did not show any evidence of acute intracranial abnormality.  Carotid Dopplers done with results as indicated above.  2-D echo done with results pending. PT/OT pending. Since patient has pacemaker MRI cannot be done. Discussed with Dr. Pearlean Brownie, changed warfarin to dabigatran 150 mg po BID as patient was subtherapeutic while complaint at home. Aspirin discontinue as patient is no chronic anticoagulation.  Atrial fibrillation and tachybradycardia syndrome status post pacemaker placement Rate controlled. Coumadin changed to dibigatran.   Hypertension  Stable. Continue present medications.   Diabetes mellitus type 2 Hemoglobin A1C of 8.5. Further titration of diabetic medications as outpatient. SSI.  Hyperlipidemia Started the patient on statin. LDL 113. Initially was reluctant about statin but was agreeable after discussion.  Alcoholism  Patient drinks vodka every day. On CIWA protocol, no signs of withdrawal. Continue thiamine and folic acid.   Code Status: Full code. Family Communication: Discussed with wife on the telephone.  Disposition Plan: DC today after ECHO results.   Consultants:  Dr. Pearlean Brownie, Neurology  Procedures: Carotid duplex on 12/12/2012 No evidence of hemodynamically significant internal carotid artery stenosis. Vertebral artery flow is antegrade.   2D Echo on 12/12/2012 Results pending.  Antibiotics:  None.  HPI/Subjective: No specific concerns. No problems with speech.  Objective: Filed Vitals:   12/11/12 2303 12/12/12 0345 12/12/12 0638 12/12/12 1000  BP: 175/72 135/58 152/62 141/56  Pulse:  74 72 64  Temp:  98.2 F (36.8 C) 98 F (36.7 C) 98.4 F (36.9 C)  TempSrc:  Oral Oral Oral  Resp:  18 20 18   Height:      Weight:      SpO2:   97% 96% 97%    Intake/Output Summary (Last 24 hours) at 12/12/12 1051 Last data filed at 12/12/12 0700  Gross per 24 hour  Intake 1070.67 ml  Output   1400 ml  Net -329.33 ml   Filed Weights   12/11/12 2116  Weight: 80.2 kg (176 lb 12.9 oz)    Exam: Physical Exam: General: Awake, Oriented, No acute distress. HEENT: EOMI. Neck: Supple CV: S1 and S2 Lungs: Clear to ascultation bilaterally Abdomen: Soft, Nontender, Nondistended, +bowel sounds. Ext: Good pulses. Trace edema, with chronic venous stasis changes.  Data Reviewed: Basic Metabolic Panel:  Lab 12/11/12 1308 12/11/12 1648  NA 134* 132*  K 4.6 4.5  CL 95* 92*  CO2 -- 28  GLUCOSE 287* 305*  BUN 14 14  CREATININE 1.10 0.99  CALCIUM -- 9.4  MG -- --  PHOS -- --   Liver Function Tests:  Lab 12/11/12 1648  AST 16  ALT 12  ALKPHOS 104  BILITOT 0.8  PROT 7.0  ALBUMIN 3.4*   No results found for this basename: LIPASE:5,AMYLASE:5 in the last 168 hours No results found for this basename: AMMONIA:5 in the last 168 hours CBC:  Lab 12/11/12 1721 12/11/12 1648  WBC -- 7.6  NEUTROABS -- 5.0  HGB 12.9* 12.5*  HCT 38.0* 36.6*  MCV -- 88.2  PLT -- 183   Cardiac Enzymes:  Lab 12/11/12 1649  CKTOTAL --  CKMB --  CKMBINDEX --  TROPONINI <0.30   BNP (last 3 results) No results found for this basename: PROBNP:3 in the last 8760 hours CBG:  Lab 12/12/12 0740 12/11/12 2120 12/11/12 1651  GLUCAP 199* 214* 301*    No  results found for this or any previous visit (from the past 240 hour(s)).   Studies: Ct Head (brain) Wo Contrast  12/11/2012  *RADIOLOGY REPORT*  Clinical Data: 78 year old male with slurred speech.  CT HEAD WITHOUT CONTRAST  Technique:  Contiguous axial images were obtained from the base of the skull through the vertex without contrast.  Comparison: 07/04/2011 and prior head CTs  Findings: Atrophy and chronic small vessel white matter ischemic changes are again noted.  No acute intracranial  abnormalities are identified, including mass lesion or mass effect, hydrocephalus, extra-axial fluid collection, midline shift, hemorrhage, or acute infarction.  The visualized bony calvarium is unremarkable.  IMPRESSION: No evidence of acute intracranial abnormality.  Atrophy and chronic small vessel white matter ischemic changes.   Original Report Authenticated By: Harmon Pier, M.D.     Scheduled Meds:   . atorvastatin  10 mg Oral q1800  . dabigatran  150 mg Oral Q12H  . digoxin  125 mcg Oral Daily  . diltiazem  240 mg Oral Daily  . folic acid  1 mg Oral Daily  . insulin aspart  0-9 Units Subcutaneous TID WC  . insulin glargine  20 Units Subcutaneous QHS  . multivitamin with minerals  1 tablet Oral Daily  . pantoprazole  40 mg Oral Daily  . thiamine  100 mg Oral Daily   Continuous Infusions:   . sodium chloride 20 mL/hr (12/11/12 2339)    Principal Problem:  *TIA (transient ischemic attack) Active Problems:  DIABETES MELLITUS, TYPE II  HYPERTENSION  Atrial fibrillation    Joselito Fieldhouse A, MD  [facility] Pager [phone]. If 7PM-7AM, please contact night-coverage at [email or web address], password [facility] 12/12/2012, 10:51 AM  LOS: 1 day

## 2012-12-12 NOTE — Progress Notes (Signed)
VASCULAR LAB PRELIMINARY  PRELIMINARY  PRELIMINARY  PRELIMINARY  Carotid duplex  completed.    Preliminary report:  Bilateral:  No evidence of hemodynamically significant internal carotid artery stenosis.   Vertebral artery flow is antegrade.      Jennika Ringgold, RVT 12/12/2012, 9:20 AM

## 2012-12-12 NOTE — Progress Notes (Signed)
Echocardiogram 2D Echocardiogram has been performed.  [name] 12/12/2012, 9:29 AM

## 2012-12-12 NOTE — Evaluation (Signed)
Physical Therapy Evaluation Patient Details Name: [name] MRN: [id number] DOB: 12-06-1934 Today's Date: 12/12/2012 Time: 4098-1191 PT Time Calculation (min): 34 min  PT Assessment / Plan / Recommendation Clinical Impression  Pt 78 yo male with h/o 3 falls in month of decemember and episodes of dizziness and stroke like symptoms. Pt can not have MRI due to having a pacemaker. Pt functioning currently at baseline and admitting symptoms have resolved. Recommend outpt PT to address balance impairments and possible vestibular issues.    PT Assessment  Patient needs continued PT services    Follow Up Recommendations  Outpatient PT;Supervision/Assistance - 24 hour    Does the patient have the potential to tolerate intense rehabilitation      Barriers to Discharge        Equipment Recommendations  None recommended by PT    Recommendations for Other Services     Frequency Min 4X/week    Precautions / Restrictions Precautions Precautions: Fall Restrictions Weight Bearing Restrictions: No   Pertinent Vitals/Pain Denies pain at this time      Mobility  Bed Mobility Bed Mobility: Supine to Sit Supine to Sit: 7: Independent;HOB flat Transfers Transfers: Sit to Stand;Stand to Sit Sit to Stand: 6: Modified independent (Device/Increase time);From bed;From chair/3-in-1;With upper extremity assist Stand to Sit: 6: Modified independent (Device/Increase time);To bed;To chair/3-in-1;With upper extremity assist Details for Transfer Assistance: Pt with uncontrolled descent when moving stand to sit Ambulation/Gait Ambulation/Gait Assistance: 4: Min guard Ambulation Distance (Feet): 200 Feet Assistive device: Rolling walker;None;Straight cane Ambulation/Gait Assistance Details: pt with mild unsteadiness with straight cane and occ episodes of dizziness when amb with straight cane. Trialed RW however pt became very frustrated with RW and was more unsteady and unsafe with RW than cane  or no AD. Pt with only minor swaying L/R but not obvious episodes of LOB. Gait Pattern: Step-to pattern Gait velocity: WFL for age Stairs: No    Shoulder Instructions     Exercises     PT Diagnosis: Difficulty walking  PT Problem List: Decreased balance;Decreased mobility;Decreased coordination;Decreased knowledge of use of DME;Decreased safety awareness PT Treatment Interventions: Gait training;Stair training;Functional mobility training;Therapeutic activities;Therapeutic exercise   PT Goals Acute Rehab PT Goals PT Goal Formulation: With patient Time For Goal Achievement: 12/19/12 Potential to Achieve Goals: Good Pt will go Sit to Stand: with modified independence;with upper extremity assist PT Goal: Sit to Stand - Progress: Goal set today Pt will Ambulate: >150 feet;with modified independence;with least restrictive assistive device PT Goal: Ambulate - Progress: Goal set today Additional Goals Additional Goal #1: Pt to score > 19 on DGI to indicate decreased fall risk. PT Goal: Additional Goal #1 - Progress: Goal set today  Visit Information  Last PT Received On: 12/12/12 Assistance Needed: +1    Subjective Data  Subjective: Pt received sitting EOB with OT Patient Stated Goal: home   Prior Functioning  Home Living Lives With: Spouse Available Help at Discharge: Family;Available PRN/intermittently Type of Home: Apartment Home Access: Elevator Home Layout: One level Bathroom Shower/Tub: Health visitor: Standard Prior Function Level of Independence: Independent with assistive device(s) Able to Take Stairs?: Yes Driving: Yes Vocation: Retired Comments: Pt reports fall 3x in December.  Pt vague re: occurance of falls, but seems to occur soon after standing or with head turns.  Unsure if EOTH involved Communication Communication: No difficulties Dominant Hand: Right    Cognition  Overall Cognitive Status: Appears within functional limits for tasks  assessed/performed Arousal/Alertness: Awake/alert Orientation Level:  Appears intact for tasks assessed Behavior During Session: [facility] for tasks performed Cognition - Other Comments: Pt very particular about things, and will self distract.  Pt very vague with h/o of falls and onset of dizziness    Extremity/Trunk Assessment Right Upper Extremity Assessment RUE ROM/Strength/Tone: Deficits RUE ROM/Strength/Tone Deficits: 4-/5 shoulder; grip 4/5.  Pt reports weakness Rt. shoulder has been progressive over 6 mos or greater, and that he participates in exericise program  RUE Sensation: WFL - Light Touch RUE Coordination: WFL - gross/fine motor Left Upper Extremity Assessment LUE ROM/Strength/Tone: Within functional levels LUE Sensation: WFL - Light Touch LUE Coordination: WFL - gross/fine motor Right Lower Extremity Assessment RLE ROM/Strength/Tone: WFL for tasks assessed (but grossly 4-/5) RLE Sensation: WFL - Light Touch Left Lower Extremity Assessment LLE ROM/Strength/Tone: WFL for tasks assessed LLE Sensation: WFL - Light Touch Trunk Assessment Trunk Assessment: Normal   Balance    End of Session PT - End of Session Equipment Utilized During Treatment: Gait belt Activity Tolerance: Patient tolerated treatment well Patient left: in chair;with call bell/phone within reach;with family/visitor present Nurse Communication: Mobility status  GP Functional Assessment Tool Used: clinical judgement Functional Limitation: Mobility: Walking and moving around Mobility: Walking and Moving Around Current Status ([id number]): At least 1 percent but less than 20 percent impaired, limited or restricted Mobility: Walking and Moving Around Goal Status [phone]): 0 percent impaired, limited or restricted Mobility: Walking and Moving Around Discharge Status [phone]): At least 1 percent but less than 20 percent impaired, limited or restricted   Marcene Brawn 12/12/2012, 3:49 PM   Lewis Shock, PT,  DPT Pager #: [phone] Office #: [phone]

## 2012-12-12 NOTE — Progress Notes (Signed)
NCM spoke to pt and he necessarily did not want therapy at [facility]. Wanted information about other location. NCM placed additional info on dc instructions about other facilities. Pt will take outpt PT Rx with him to his appt. Isidoro Donning RN CCM Case Mgmt phone [phone]

## 2012-12-12 NOTE — Evaluation (Signed)
Occupational Therapy Evaluation Patient Details Name: LEOPOLDO MAZZIE MRN: [id number] DOB: 21-Sep-1935 Today's Date: 12/12/2012 Time: 4098-1191 OT Time Calculation (min): 54 min  OT Assessment / Plan / Recommendation Clinical Impression  This 78 y.o. male admitted with garbled speech.  Work up for CVA completed.  CT negative, unable to undergo MRI due to pacemaker.  Pt with mild visual disturbances that appear to be possibly vestibularly related as he reports associated dizziness.  PT in at end of OT session to asses.  No further OT needs identified; however, recommend OPPT for balance/vestibular issues    OT Assessment  Patient does not need any further OT services    Follow Up Recommendations  No OT follow up;Other (comment) (OPPT)    Barriers to Discharge      Equipment Recommendations  None recommended by OT    Recommendations for Other Services    Frequency       Precautions / Restrictions Precautions Precautions: Fall Restrictions Weight Bearing Restrictions: No       ADL  Eating/Feeding: Independent Where Assessed - Eating/Feeding: Edge of bed Grooming: Wash/dry hands;Wash/dry face;Teeth care;Supervision/safety Where Assessed - Grooming: Supported standing Upper Body Bathing: Set up Where Assessed - Upper Body Bathing: Unsupported sitting Lower Body Bathing: Supervision/safety Where Assessed - Lower Body Bathing: Supported sit to stand Upper Body Dressing: Set up Where Assessed - Upper Body Dressing: Unsupported sitting Lower Body Dressing: Supervision/safety Where Assessed - Lower Body Dressing: Unsupported sit to stand Toilet Transfer: Supervision/safety Toilet Transfer Method: Sit to Barista: Comfort height toilet Toileting - Architect and Hygiene: Modified independent Where Assessed - Engineer, mining and Hygiene: Standing Tub/Shower Transfer: Supervision/safety Tub/Shower Transfer Method:  Science writer: Walk in Scientist, research (physical sciences) Used: Cane Transfers/Ambulation Related to ADLs: Pt ambulates with supervision with SPC. Pt with c/o "dizziness" when ambulating in hallway and reading room numbers ADL Comments: Pt with difficulty donning Rt. shoe, but is able to do so without assistance.   (Pt doesn't feel he needs a shower seat)    OT Diagnosis:    OT Problem List:   OT Treatment Interventions:     OT Goals    Visit Information  Last OT Received On: 12/12/12 Assistance Needed: +1    Subjective Data  Subjective: "I fell three times in December" Patient Stated Goal: To go home   Prior Functioning     Home Living Lives With: Spouse Available Help at Discharge: Family;Available PRN/intermittently Type of Home: Apartment Home Access: Elevator Home Layout: One level Bathroom Shower/Tub: Health visitor: Standard Prior Function Level of Independence: Independent with assistive device(s) Able to Take Stairs?: Yes Driving: Yes Vocation: Retired Comments: Pt reports fall 3x in December.  Pt vague re: occurance of falls, but seems to occur soon after standing or with head turns.  Unsure if EOTH involved Communication Communication: No difficulties Dominant Hand: Right         Vision/Perception Vision - Assessment Eye Alignment: Within Functional Limits Vision Assessment: Vision tested Ocular Range of Motion: Within Functional Limits Tracking/Visual Pursuits: Able to track stimulus in all quads without difficulty Saccades: Undershoots (more obvious on Lt) Additional Comments: Pt with c/o dizziness during pursuits when moving from superior to inferior quadrants on Lt.; pt undershoots object in Lt inferior quadrant with moving saccades and c/o dizziness Perception Perception: Within Functional Limits Praxis Praxis: Intact   Cognition  Overall Cognitive Status: Appears within functional limits for tasks  assessed/performed Arousal/Alertness: Awake/alert Orientation Level: Appears  intact for tasks assessed Behavior During Session: [facility] for tasks performed Cognition - Other Comments: Pt very particular about things, and will self distract.  Pt very vague with h/o of falls and onset of dizziness    Extremity/Trunk Assessment Right Upper Extremity Assessment RUE ROM/Strength/Tone: Deficits RUE ROM/Strength/Tone Deficits: 4-/5 shoulder; grip 4/5.  Pt reports weakness Rt. shoulder has been progressive over 6 mos or greater, and that he participates in exericise program  RUE Sensation: WFL - Light Touch RUE Coordination: WFL - gross/fine motor Left Upper Extremity Assessment LUE ROM/Strength/Tone: Within functional levels LUE Sensation: WFL - Light Touch LUE Coordination: WFL - gross/fine motor     Mobility Bed Mobility Bed Mobility: Supine to Sit Supine to Sit: 7: Independent;HOB flat Transfers Transfers: Sit to Stand;Stand to Sit Sit to Stand: 6: Modified independent (Device/Increase time);From bed;From chair/3-in-1;With upper extremity assist Stand to Sit: 6: Modified independent (Device/Increase time);To bed;To chair/3-in-1;With upper extremity assist Details for Transfer Assistance: Pt with uncontrolled descent when moving stand to sit     Shoulder Instructions     Exercise     Balance     End of Session OT - End of Session Activity Tolerance: Patient tolerated treatment well Patient left: with call bell/phone within reach;in bed Nurse Communication: Other (comment) (Need for OP f/u)  GO     Lache Dagher, Ursula Alert M 12/12/2012, 2:22 PM

## 2012-12-12 NOTE — Discharge Summary (Addendum)
Physician Discharge Summary  [name] ZOX:[id number] DOB: 1935-07-21 DOA: 12/11/2012  PCP: Carrie Mew, MD  Admit date: 12/11/2012 Discharge date: 12/12/2012  Time spent: 25 minutes  Recommendations for Outpatient Follow-up:  Please followup with Carrie Mew, MD (PCP) in 1 week.  Please followup with Dr. Pearlean Brownie (neurology) in 2 months.  Discharge Diagnoses:  Principal Problem:  *TIA (transient ischemic attack) Active Problems:  DIABETES MELLITUS, TYPE II  HYPERTENSION  Atrial fibrillation   Discharge Condition: Stable  Diet recommendation: Diabetic diet  Filed Weights   12/11/12 2116  Weight: 80.2 kg (176 lb 12.9 oz)    History of present illness:  On admission: "78 year old male with history of atrial fibrillation/tachybradycardia syndrome status post pacemaker placement on Coumadin, hypertension and diabetes mellitus type 2 started experiencing difficulty speaking around 3 PM today. Patient was not able to bring out words. Denies any associated headache visual changes focal deficits loss of consciousness. Episode lasted for one hour and resolved spontaneously. By the time patient reached ER he did not have any focal deficits."  Hospital Course:  TIA  Patient presented with difficulty speaking, symptoms resolved. Head CT did not show any evidence of acute intracranial abnormality. Carotid Dopplers done with results as indicated above. 2-D echo done with results pending. PT/OT pending. Since patient has pacemaker MRI cannot be done. Discussed with Dr. Pearlean Brownie, changed warfarin to dabigatran 150 mg po BID as patient was subtherapeutic while complaint at home. Aspirin discontinued as patient is on chronic anticoagulation. Patient was evaluated by PT who recommended outpatient PT, arranged prior discharge.  Atrial fibrillation and tachybradycardia syndrome status post pacemaker placement  Rate controlled. Coumadin changed to dibigatran.   Hypertension  Stable.  Continue present medications.   Diabetes mellitus type 2  Hemoglobin A1C of 8.5. Further titration of diabetic medications as outpatient. SSI.   Hyperlipidemia  Started the patient on statin. LDL 113. Initially was reluctant about statin but was agreeable after discussion, if patient has further concerns about statin, asked him to discuss with his primary care physician.   Alcoholism  Patient drinks vodka every day. On CIWA protocol, no signs of withdrawal. Continue thiamine and folic acid.   Consultants:  Dr. Pearlean Brownie, Neurology  Procedures:  Carotid duplex on 12/12/2012  No evidence of hemodynamically significant internal carotid artery stenosis. Vertebral artery flow is antegrade.   2D Echo on 12/12/2012  Study Conclusions - Left ventricle: The cavity size was normal. Wall thickness was increased in a pattern of mild LVH. Systolic function was normal. The estimated ejection fraction was in the range of 55% to 60%. Wall motion was normal; there were no regional wall motion abnormalities. - Mitral valve: Posterior leaflet is restricted with subvalvular calcification Mild regurgitation. Valve area by continuity equation (using LVOT flow): 2.12cm^2. - Left atrium: The atrium was moderately dilated. - Atrial septum: No defect or patent foramen ovale was identified. - Pulmonary arteries: PA peak pressure: 37mm Hg (S).  Antibiotics:  None.  Discharge Exam: Filed Vitals:   12/11/12 2303 12/12/12 0345 12/12/12 0638 12/12/12 1000  BP: 175/72 135/58 152/62 141/56  Pulse:  74 72 64  Temp:  98.2 F (36.8 C) 98 F (36.7 C) 98.4 F (36.9 C)  TempSrc:  Oral Oral Oral  Resp:  18 20 18   Height:      Weight:      SpO2:  97% 96% 97%   Discharge Instructions  Discharge Orders    Future Appointments: Provider: Department: Dept Phone: Center:   12/31/2012  1:30 PM Baker Pierini, FNP [facility] [place] [phone] [facility]     Future Orders Please Complete By Expires    Diet Carb Modified      Increase activity slowly      Discharge instructions      Comments:   Please followup with Carrie Mew, MD (PCP) in 1 week.  Please followup with Dr. Pearlean Brownie (neurology) in 2 months.       Medication List     As of 12/12/2012 12:49 PM    STOP taking these medications         insulin aspart 100 UNIT/ML injection   Commonly known as: novoLOG      warfarin 5 MG tablet   Commonly known as: COUMADIN      TAKE these medications         atorvastatin 10 MG tablet   Commonly known as: LIPITOR   Take 1 tablet (10 mg total) by mouth daily at 6 PM.      dabigatran 150 MG Caps   Commonly known as: PRADAXA   Take 1 capsule (150 mg total) by mouth every 12 (twelve) hours.      digoxin 0.125 MG tablet   Commonly known as: LANOXIN   Take 125 mcg by mouth daily.      diltiazem 240 MG 24 hr capsule   Commonly known as: CARDIZEM CD   Take 240 mg by mouth daily.      etodolac 300 MG capsule   Commonly known as: LODINE   Take 300 mg by mouth 2 (two) times daily.      folic acid 1 MG tablet   Commonly known as: FOLVITE   Take 1 tablet (1 mg total) by mouth daily.      furosemide 40 MG tablet   Commonly known as: LASIX   Take 1 tablet (40 mg total) by mouth daily.      insulin glargine 100 UNIT/ML injection   Commonly known as: LANTUS   Inject 20 Units into the skin at bedtime.      metFORMIN 500 MG tablet   Commonly known as: GLUCOPHAGE   Take 500 mg by mouth 2 (two) times daily with a meal.      omeprazole 20 MG capsule   Commonly known as: PRILOSEC   Take 20 mg by mouth 2 (two) times daily.      oxyCODONE 15 MG immediate release tablet   Commonly known as: ROXICODONE   Take 1 tablet (15 mg total) by mouth every 6 (six) hours as needed for pain.      thiamine 100 MG tablet   Take 1 tablet (100 mg total) by mouth daily.           Follow-up Information    Follow up with Gates Rigg, MD. Schedule an appointment as soon as  possible for a visit in 2 months. (stroke clinic)    Contact information:   [address] [facility] [place] [address] [phone]           The results of significant diagnostics from this hospitalization (including imaging, microbiology, ancillary and laboratory) are listed below for reference.    Significant Diagnostic Studies: Ct Head (brain) Wo Contrast  12/11/2012  *RADIOLOGY REPORT*  Clinical Data: 78 year old male with slurred speech.  CT HEAD WITHOUT CONTRAST  Technique:  Contiguous axial images were obtained from the base of the skull through the vertex without contrast.  Comparison: 07/04/2011 and prior  head CTs  Findings: Atrophy and chronic small vessel white matter ischemic changes are again noted.  No acute intracranial abnormalities are identified, including mass lesion or mass effect, hydrocephalus, extra-axial fluid collection, midline shift, hemorrhage, or acute infarction.  The visualized bony calvarium is unremarkable.  IMPRESSION: No evidence of acute intracranial abnormality.  Atrophy and chronic small vessel white matter ischemic changes.   Original Report Authenticated By: Harmon Pier, M.D.     Microbiology: No results found for this or any previous visit (from the past 240 hour(s)).   Labs: Basic Metabolic Panel:  Lab 12/11/12 4098 12/11/12 1648  NA 134* 132*  K 4.6 4.5  CL 95* 92*  CO2 -- 28  GLUCOSE 287* 305*  BUN 14 14  CREATININE 1.10 0.99  CALCIUM -- 9.4  MG -- --  PHOS -- --   Liver Function Tests:  Lab 12/11/12 1648  AST 16  ALT 12  ALKPHOS 104  BILITOT 0.8  PROT 7.0  ALBUMIN 3.4*   No results found for this basename: LIPASE:5,AMYLASE:5 in the last 168 hours No results found for this basename: AMMONIA:5 in the last 168 hours CBC:  Lab 12/11/12 1721 12/11/12 1648  WBC -- 7.6  NEUTROABS -- 5.0  HGB 12.9* 12.5*  HCT 38.0* 36.6*  MCV -- 88.2  PLT -- 183   Cardiac Enzymes:  Lab 12/11/12 1649    CKTOTAL --  CKMB --  CKMBINDEX --  TROPONINI <0.30   BNP: BNP (last 3 results) No results found for this basename: PROBNP:3 in the last 8760 hours CBG:  Lab 12/12/12 0740 12/11/12 2120 12/11/12 1651  GLUCAP 199* 214* 301*       Signed:  Angelique Chevalier A  [facility] 12/12/2012, 12:49 PM

## 2012-12-12 NOTE — Progress Notes (Addendum)
Stroke Team Progress Note  HISTORY [name] is an 78 y.o. male with a history of atrial fibrillation on Coumadin who reports that today 12/11/2012 while having brunch with his wife he experienced an episode of difficulty with speech. He reports that he acutely became unable to get his words out. His wife reports what was coming out was jiberish. After about 30 minutes he was able to get some words out but was unable to remember things like his daughters' names, friends' names, etc. Patient feels he was back to baseline after about an hour. Patient reports that he is compliant with his Coumadin but may have missed taking it yesterday. Patient was not a TPA candidate secondary to resolution of symptoms. He was admitted for further evaluation and treatment.  SUBJECTIVE No family is at the bedside.  Overall he feels his condition is completely resolved. Symptoms yesterday. He wanted to complement his wife on the cakes she made him, but he could not say it. He knew what he wanted to say. He could understand his wife. He reports his coumadin has been stable in the past.  OBJECTIVE Most recent Vital Signs: Filed Vitals:   12/11/12 2116 12/11/12 2303 12/12/12 0345 12/12/12 0638  BP: 189/91 175/72 135/58 152/62  Pulse: 85  74 72  Temp: 98.3 F (36.8 C)  98.2 F (36.8 C) 98 F (36.7 C)  TempSrc: Oral  Oral Oral  Resp: 20  18 20   Height: 5\' 10"  (1.778 m)     Weight: 80.2 kg (176 lb 12.9 oz)     SpO2: 100%  97% 96%   CBG (last 3)   Basename 12/12/12 0740 12/11/12 2120 12/11/12 1651  GLUCAP 199* 214* 301*    IV Fluid Intake:     . sodium chloride 20 mL/hr (12/11/12 2339)    MEDICATIONS    . aspirin  325 mg Oral Daily  . digoxin  125 mcg Oral Daily  . diltiazem  240 mg Oral Daily  . folic acid  1 mg Oral Daily  . insulin aspart  0-9 Units Subcutaneous TID WC  . insulin glargine  20 Units Subcutaneous QHS  . multivitamin with minerals  1 tablet Oral Daily  . pantoprazole  40 mg  Oral Daily  . pneumococcal 23 valent vaccine  0.5 mL Intramuscular Tomorrow-1000  . thiamine  100 mg Oral Daily   Or  . thiamine  100 mg Intravenous Daily  . Warfarin - Pharmacist Dosing Inpatient   Does not apply q1800   PRN:  LORazepam, LORazepam, oxyCODONE  Diet:  Carb Control thin liquids Activity:   Bathroom privileges with assistance DVT Prophylaxis:  coumadin  CLINICALLY SIGNIFICANT STUDIES Basic Metabolic Panel:  Lab 12/11/12 2130 12/11/12 1648  NA 134* 132*  K 4.6 4.5  CL 95* 92*  CO2 -- 28  GLUCOSE 287* 305*  BUN 14 14  CREATININE 1.10 0.99  CALCIUM -- 9.4  MG -- --  PHOS -- --   Liver Function Tests:  Lab 12/11/12 1648  AST 16  ALT 12  ALKPHOS 104  BILITOT 0.8  PROT 7.0  ALBUMIN 3.4*   CBC:  Lab 12/11/12 1721 12/11/12 1648  WBC -- 7.6  NEUTROABS -- 5.0  HGB 12.9* 12.5*  HCT 38.0* 36.6*  MCV -- 88.2  PLT -- 183   Coagulation:  Lab 12/12/12 0644 12/11/12 1648  LABPROT 19.8* 18.6*  INR 1.75* 1.61*   Cardiac Enzymes:  Lab 12/11/12 1649  CKTOTAL --  CKMB --  CKMBINDEX --  TROPONINI <0.30   Urinalysis: No results found for this basename: COLORURINE:2,APPERANCEUR:2,LABSPEC:2,PHURINE:2,GLUCOSEU:2,HGBUR:2,BILIRUBINUR:2,KETONESUR:2,PROTEINUR:2,UROBILINOGEN:2,NITRITE:2,LEUKOCYTESUR:2 in the last 168 hours Lipid Panel    Component Value Date/Time   CHOL 186 12/12/2012 0644   TRIG 171* 12/12/2012 0644   HDL 39* 12/12/2012 0644   CHOLHDL 4.8 12/12/2012 0644   VLDL 34 12/12/2012 0644   LDLCALC 113* 12/12/2012 0644   HgbA1C  Lab Results  Component Value Date   HGBA1C 8.5* 12/11/2012    Urine Drug Screen:     Component Value Date/Time   LABOPIA NONE DETECTED 12/11/2012 2350   LABOPIA NEGATIVE 07/05/2011 1009   COCAINSCRNUR NONE DETECTED 12/11/2012 2350   COCAINSCRNUR NEGATIVE 07/05/2011 1009   LABBENZ NONE DETECTED 12/11/2012 2350   LABBENZ NEGATIVE 07/05/2011 1009   AMPHETMU NONE DETECTED 12/11/2012 2350   AMPHETMU NEGATIVE 07/05/2011 1009   THCU NONE  DETECTED 12/11/2012 2350   LABBARB NONE DETECTED 12/11/2012 2350    Alcohol Level: No results found for this basename: ETH:2 in the last 168 hours  CT of the brain  12/11/2012  No evidence of acute intracranial abnormality.  Atrophy and chronic small vessel white matter ischemic changes.  MRI of the brain  pacemaker  MRA of the brain  pacemaker  2D Echocardiogram    Carotid Doppler  No evidence of hemodynamically significant internal carotid artery stenosis. Vertebral artery flow is antegrade.   CXR    EKG  atrial fibrillation, rate 93.   Therapy Recommendations   Physical Exam  Pleasant middle-aged Caucasian male currently not in distress.Awake alert. Afebrile. Head is nontraumatic. Neck is supple without bruit. Hearing is normal. Cardiac exam no murmur or gallop. Lungs are clear to auscultation. Distal pulses are well felt.  Neurological Exam ; Awake  Alert oriented x 3. Normal speech and language.eye movements full without nystagmus. fundi were not visualized. Vision acuity and fields appear normal.. Face symmetric. Tongue midline. Normal strength, tone, reflexes and coordination. Normal sensation. Gait deferred.  ASSESSMENT [name] is a 79 y.o. male presenting with expressive aphasia. Initial CT unrevealing. Work up underway. On warfarin prior to admission. INR subtherapeutic at 1.61 on arrival. Now on aspirin 325 mg orally every day and warfarin for secondary stroke prevention. INR remains subtherapeutic at 1.75. Patient with no resultant stroke symptoms.  atrial fibrillation. Followed by Dr. Graciela Husbands Diabetes, uncontrolled, HgbA1c 8.5 Hyperlipidemia, LDL 113, not on statin PTA, refuses to be on statin as his wife has had problems with statin, goal LDL < 100 Hypertension pacemaker  Hospital day # 1  TREATMENT/PLAN  Stop aspirin. Continue warfarin for secondary stroke prevention for now. Recommend change to one of the new oral anticoagulants for consistent therapeutic  levels - pradaxa 150 mg bid  Add statin. Patient agreeable after discussion  Improved diabetes control. Discussed with pt.  No need to repeat CT to confirm stroke. It will not change management  F/u 2D results  Therapy evals Stroke Service will sign off. Follow up with Dr. Pearlean Brownie, Stroke Clinic, in 2 months.  Annie Main, MSN, RN, ANVP-BC, ANP-BC, [facility] Pager: [phone] 12/12/2012 9:59 AM  I have personally obtained a history, examined the patient, evaluated imaging results, and formulated the assessment and plan of care. I agree with the above.  Delia Heady, MD Medical Director [facility] Pager: [phone] 12/12/2012 5:50 PM

## 2012-12-12 NOTE — Progress Notes (Signed)
Dc instructions given to pt at this time re: f/u appts, diet, activity, new meds, meds to continue, s/s of problems to report to physician, and community resources.  Pt verbalized understanding of all instructions.  No s/s of any acute distress at DC.  NIH score is 0.

## 2012-12-13 NOTE — Progress Notes (Signed)
Late Entry for 12-31-2012  Occupational Therapy Evaluation Addendum  12-31-2012 1400  OT G-codes **NOT FOR INPATIENT CLASS**  Functional Limitation Self care  Self Care Current Status [phone]) CI  Self Care Goal Status ([id number]) CI  Self Care Discharge Status [phone]) CI    Jeani Hawking, OTR/L [phone]

## 2012-12-14 ENCOUNTER — Telehealth: Payer: Self-pay | Admitting: Internal Medicine

## 2012-12-14 NOTE — Telephone Encounter (Signed)
Pt states he was d/c from hospital this week after having a TIA.  He is addiment he must be seen on Monday for post hosp f/u with Dr. Lovell Sheehan.  I notified pt that Dr. Lovell Sheehan was currently out of the office and did not have availability on Monday.  Pt requesting to be worked in.  Please advise.

## 2012-12-19 ENCOUNTER — Ambulatory Visit: Payer: Medicare Other | Admitting: Internal Medicine

## 2012-12-20 ENCOUNTER — Telehealth: Payer: Self-pay | Admitting: Internal Medicine

## 2012-12-20 NOTE — Telephone Encounter (Signed)
Dr Lovell Sheehan will address in the morning

## 2012-12-20 NOTE — Telephone Encounter (Signed)
Pt call back and requesting appt time between 10 am and 12 noon

## 2012-12-20 NOTE — Telephone Encounter (Signed)
Pt needs post hospital follow up dx Tia. Pt decline to see another provider. Pt decline appt in April.

## 2012-12-21 NOTE — Telephone Encounter (Signed)
Pt sch for 12-26-12 830am

## 2012-12-21 NOTE — Telephone Encounter (Signed)
Ask him to come Wednesday at 830

## 2012-12-26 ENCOUNTER — Ambulatory Visit (INDEPENDENT_AMBULATORY_CARE_PROVIDER_SITE_OTHER): Payer: Medicare Other | Admitting: Internal Medicine

## 2012-12-26 ENCOUNTER — Telehealth: Payer: Self-pay | Admitting: Internal Medicine

## 2012-12-26 ENCOUNTER — Encounter: Payer: Self-pay | Admitting: Internal Medicine

## 2012-12-26 VITALS — BP 140/72 | HR 72 | Temp 98.2°F | Resp 16 | Ht 70.0 in | Wt 175.0 lb

## 2012-12-26 DIAGNOSIS — I4891 Unspecified atrial fibrillation: Secondary | ICD-10-CM

## 2012-12-26 DIAGNOSIS — M549 Dorsalgia, unspecified: Secondary | ICD-10-CM

## 2012-12-26 DIAGNOSIS — N419 Inflammatory disease of prostate, unspecified: Secondary | ICD-10-CM

## 2012-12-26 DIAGNOSIS — E785 Hyperlipidemia, unspecified: Secondary | ICD-10-CM

## 2012-12-26 DIAGNOSIS — G459 Transient cerebral ischemic attack, unspecified: Secondary | ICD-10-CM

## 2012-12-26 MED ORDER — DOXYCYCLINE HYCLATE 100 MG PO TABS: 100.0000 mg | ORAL_TABLET | Freq: Two times a day (BID) | ORAL | Status: DC

## 2012-12-26 MED ORDER — ATORVASTATIN CALCIUM 10 MG PO TABS: 10.0000 mg | ORAL_TABLET | ORAL | Status: DC

## 2012-12-26 MED ORDER — TRAMADOL HCL 50 MG PO TABS: 50.0000 mg | ORAL_TABLET | Freq: Three times a day (TID) | ORAL | Status: DC | PRN

## 2012-12-26 MED ORDER — OXYCODONE HCL 10 MG PO TABS: 10.0000 mg | ORAL_TABLET | Freq: Four times a day (QID) | ORAL | Status: DC | PRN

## 2012-12-26 MED ORDER — OXYCODONE HCL 15 MG PO TABS: 15.0000 mg | ORAL_TABLET | Freq: Four times a day (QID) | ORAL | Status: DC | PRN

## 2012-12-26 NOTE — Telephone Encounter (Signed)
Ok to change to [employer] and is ready for dr Lovell Sheehan to sign,  Will call pt when completed

## 2012-12-26 NOTE — Telephone Encounter (Signed)
Pt states that he was here this morning. States he needs to change a prescription. Would not elaborate. Asked that you call him.

## 2012-12-26 NOTE — Patient Instructions (Addendum)
Dabigatran oral capsules What is this medicine? DABIGATRAN is an anticoagulant (blood thinner). It is used to lower the chance of stroke in people with a medical condition called atrial fibrillation. This medicine may be used for other purposes; ask your health care provider or pharmacist if you have questions. What should I tell my health care provider before I take this medicine? They need to know if you have any of these conditions: -bleeding disorders -history of stomach bleeding -mechanical heart valve -kidney disease -an allergic reaction to dabigatran, other medicines, foods, dyes, or preservatives -pregnant or trying to get pregnant -breast-feeding How should I use this medicine? Take this medicine by mouth with a glass of water. Follow the directions on the prescription label. Swallow the capsules whole. Do not break, chew, or empty the contents from the capsule. You can take it with or without food. If it upsets your stomach, take it with food. Take your medicine at regular intervals. Do not take it more often than directed. Do not stop taking except on your doctor's advice. A special MedGuide will be given to you by the pharmacist with each prescription and refill. Be sure to read this information carefully each time. Talk to your pediatrician regarding the use of this medicine in children. Special care may be needed. Overdosage: If you think you've taken too much of this medicine contact a poison control center or emergency room at once. Overdosage: If you think you have taken too much of this medicine contact a poison control center or emergency room at once. NOTE: This medicine is only for you. Do not share this medicine with others. What if I miss a dose? If you miss a dose, take it as soon as you can. If your next dose is less than 6 hours away, skip the missed dose. Do not take double or extra doses. What may interact with this medicine? Do not take this medicine with any of  the following medications: -certain medicines that treat or prevent blood clots like warfarin, enoxaparin, and dalteparin -mifepristone, RU-486This medicine may also interact with the following: -carbamazepine -certain medicines for depression -dronedarone -ketoconazole -NSAIDs, medicines for pain and inflammation, like ibuprofen or naproxen -quinidine -rifampin -tipranavir This list may not describe all possible interactions. Give your health care provider a list of all the medicines, herbs, non-prescription drugs, or dietary supplements you use. Also tell them if you smoke, drink alcohol, or use illegal drugs. Some items may interact with your medicine. What should I watch for while using this medicine? Visit your doctor or health care professional for regular check ups. Do not stop taking your medicine unless your doctor tells you to. If you are going to have surgery, tell your doctor or health care professional that you are taking this medicine. What side effects may I notice from receiving this medicine? Side effects that you should report to your doctor or health care professional as soon as possible: -allergic reactions like skin rash, itching or hives, swelling of the face, lips, or tongue -breathing problems -chest pain or chest tightness -feeling faint or lightheaded, falls -headache -signs and symptoms of bleeding such as bloody or black, tarry stools, red or dark-brown urine, spitting up blood or brown material that looks like coffee grounds, red spots on the skin, unusual bruising or bleeding from the eye, gums, or nose  Side effects that usually do not require medical attention (Report these to your doctor or health care professional if they continue or are bothersome.): -stomach  pain -upset stomach This list may not describe all possible side effects. Call your doctor for medical advice about side effects. You may report side effects to FDA at 1-800-FDA-1088. Where should I  keep my medicine? Keep out of the reach of children. Store at room temperature between 15 and 30 degrees C (59 and 86 degrees F). Keep capsules in the original container. Do not store or place capsules in any other container, such as pill boxes or pill organizers. After opening the bottle, use within 4 months. Throw away any unused medicine after 4 months. NOTE: This sheet is a summary. It may not cover all possible information. If you have questions about this medicine, talk to your doctor, pharmacist, or health care provider.  2013, [employer]/Gold Standard. (11/10/2011 10:11:45 AM)    Stop the etodolac

## 2012-12-26 NOTE — Telephone Encounter (Signed)
The antibiotic doxycycline Bid for 14 days  May change to 10 qid

## 2012-12-26 NOTE — Progress Notes (Signed)
Subjective:    Patient ID: [name], male    DOB: December 27, 1934, 78 y.o.   MRN: [id number]  HPI  He had a TIA vs small CVA with dysarthria  ( expressive) with recognition. Code CVA at hospital and discharged with new anticoagulation The patient accepted the risks of a novel agent at his age after explanation of risk He was discharged on 21st on etodolac. Increased episodes of urgency frequency, nocturia ( 1) and leakage No reported dysuria  Review of Systems  Constitutional: Negative for fever and fatigue.  HENT: Negative for hearing loss, congestion, neck pain and postnasal drip.   Eyes: Negative for discharge, redness and visual disturbance.  Respiratory: Negative for cough, shortness of breath and wheezing.   Cardiovascular: Positive for palpitations. Negative for leg swelling.  Gastrointestinal: Negative for abdominal pain, constipation and abdominal distention.  Genitourinary: Positive for urgency, frequency and enuresis. Negative for dysuria and decreased urine volume.  Musculoskeletal: Positive for myalgias, joint swelling and gait problem. Negative for arthralgias.  Skin: Negative for color change and rash.  Neurological: Negative for weakness and light-headedness.  Hematological: Negative for adenopathy.  Psychiatric/Behavioral: Negative for behavioral problems.       Past Medical History  Diagnosis Date  . Fatigue   . Overweight   . Syncope and collapse   . GERD (gastroesophageal reflux disease)   . Right bundle branch block   . Unspecified venous (peripheral) insufficiency   . Phlebitis and thrombophlebitis of superficial vessels of lower extremities   . Cellulitis and abscess of leg, except foot   . Backache, unspecified   . Long term (current) use of anticoagulants   . Type II or unspecified type diabetes mellitus without mention of complication, not stated as uncontrolled   . Unspecified venous (peripheral) insufficiency   . Hypertension   . Gout,  unspecified   . Heart failure   . Atrial fibrillation     permanent  . Esophageal stricture   . Hx of colonic polyps 05-2006    (Adenomatous)Dr. Arlyce Dice  . Diverticulosis 05-2006    Dr. Arlyce Dice   . Tachycardia-bradycardia     s/p PPM    History   Social History  . Marital Status: Married    Spouse Name: [name]    Number of Children: 2  . Years of Education: [name]   Occupational History  . Retired     Social History Main Topics  . Smoking status: Never Smoker   . Smokeless tobacco: Never Used  . Alcohol Use: Yes     Comment: two drinks daily   . Drug Use: No  . Sexually Active: Yes   Other Topics Concern  . Not on file   Social History Narrative   2 caffeine drinks daily     Past Surgical History  Procedure Date  . Laminectomy   . Lumbar fusion   . Rotator cuff repair   . Ruptured rt rectus muscle   . Eye surgery   . Pacemaker insertion 8/12    SJM by Dr Johney Frame for tachy/brady syndrome    Family History  Problem Relation Age of Onset  . Pneumonia Mother   . Heart disease Father   . Diabetes Father   . Colon cancer Neg Hx     Allergies  Allergen Reactions  . Morphine Rash    REACTION:irritability    Current Outpatient Prescriptions on File Prior to Visit  Medication Sig Dispense Refill  . atorvastatin (LIPITOR) 10 MG tablet Take 1 tablet (  10 mg total) by mouth daily at 6 PM.  30 tablet  0  . dabigatran (PRADAXA) 150 MG CAPS Take 1 capsule (150 mg total) by mouth every 12 (twelve) hours.  60 capsule  0  . digoxin (LANOXIN) 0.125 MG tablet Take 125 mcg by mouth daily.      Marland Kitchen diltiazem (CARDIZEM CD) 240 MG 24 hr capsule Take 240 mg by mouth daily.      . folic acid (FOLVITE) 1 MG tablet Take 1 tablet (1 mg total) by mouth daily.  30 tablet  0  . furosemide (LASIX) 40 MG tablet Take 1 tablet (40 mg total) by mouth daily.  60 tablet  6  . insulin glargine (LANTUS) 100 UNIT/ML injection Inject 20 Units into the skin at bedtime.       . metFORMIN (GLUCOPHAGE)  500 MG tablet Take 500 mg by mouth 2 (two) times daily with a meal.      . omeprazole (PRILOSEC) 20 MG capsule Take 20 mg by mouth 2 (two) times daily.      Marland Kitchen oxyCODONE (ROXICODONE) 15 MG immediate release tablet Take 1 tablet (15 mg total) by mouth every 6 (six) hours as needed for pain.  120 tablet  0  . thiamine 100 MG tablet Take 1 tablet (100 mg total) by mouth daily.  30 tablet  0  . UNABLE TO FIND Outpatient physical therapy for TIA.  1 Units  0    BP 140/72  Pulse 72  Temp 98.2 F (36.8 C)  Resp 16  Ht 5\' 10"  (1.778 m)  Wt 175 lb (79.379 kg)  BMI 25.11 kg/m2    Objective:   Physical Exam  Nursing note and vitals reviewed. Constitutional: He appears well-developed and well-nourished.  HENT:  Head: Normocephalic and atraumatic.  Eyes: Conjunctivae normal are normal. Pupils are equal, round, and reactive to light.  Neck: Normal range of motion. Neck supple.  Cardiovascular: Normal rate.   Murmur heard. Pulmonary/Chest: Effort normal and breath sounds normal.  Abdominal: Soft. Bowel sounds are normal.  Musculoskeletal: He exhibits edema and tenderness.          Assessment & Plan:  Because of the use of a novel anticoagulant we will have to discontinue the nonsteroidal and replace that with generic Ultram with continued use of the oxycodone as needed.  The patient has self discontinued alcohol as a safety factor along with ultram which we believe is prudent at this time.  The patient's diabetes in the hospital appeared with A1c of 8.5. We will increase the Lantus to 28 units

## 2012-12-26 NOTE — Telephone Encounter (Signed)
Pt states the oxycodone 15 mg you sent in is 45.00,but if you will send in 10 mg or 20 mg itis on ly 8.00--can you change and he also asked what med you sent to [facility] that was free?

## 2012-12-27 ENCOUNTER — Encounter: Payer: Self-pay | Admitting: *Deleted

## 2012-12-31 ENCOUNTER — Encounter: Payer: Medicare Other | Admitting: Family

## 2013-01-01 ENCOUNTER — Telehealth: Payer: Self-pay | Admitting: Internal Medicine

## 2013-01-02 ENCOUNTER — Other Ambulatory Visit: Payer: Self-pay | Admitting: Internal Medicine

## 2013-01-02 NOTE — Telephone Encounter (Signed)
PATIENT NEEDS APPT BEFORE REFILLS CAN BE GIVEN.  PATIENT WOULD LIKE SOONER APPOINTMENT BEFORE 02/19/13

## 2013-01-08 ENCOUNTER — Other Ambulatory Visit: Payer: Self-pay | Admitting: Internal Medicine

## 2013-01-08 ENCOUNTER — Ambulatory Visit: Payer: Self-pay | Admitting: Family

## 2013-01-08 ENCOUNTER — Telehealth: Payer: Self-pay | Admitting: Family

## 2013-01-08 DIAGNOSIS — I495 Sick sinus syndrome: Secondary | ICD-10-CM

## 2013-01-08 NOTE — Telephone Encounter (Signed)
Needs an INR visit asap!! 

## 2013-01-08 NOTE — Telephone Encounter (Signed)
Pt states coumadin was d/c by Dr. Lovell Sheehan

## 2013-01-16 ENCOUNTER — Other Ambulatory Visit: Payer: Self-pay | Admitting: Internal Medicine

## 2013-01-16 ENCOUNTER — Encounter: Payer: Self-pay | Admitting: Internal Medicine

## 2013-01-16 ENCOUNTER — Ambulatory Visit (INDEPENDENT_AMBULATORY_CARE_PROVIDER_SITE_OTHER): Payer: Medicare Other | Admitting: *Deleted

## 2013-01-16 DIAGNOSIS — I4891 Unspecified atrial fibrillation: Secondary | ICD-10-CM

## 2013-01-16 LAB — PACEMAKER DEVICE OBSERVATION
BRDY-0002RV: 60 {beats}/min
BRDY-0005RV: 50 {beats}/min
RV LEAD AMPLITUDE: 8.3 mv
RV LEAD IMPEDENCE PM: 637.5 Ohm

## 2013-01-16 NOTE — Progress Notes (Signed)
PPM check 

## 2013-01-25 ENCOUNTER — Other Ambulatory Visit: Payer: Self-pay | Admitting: Internal Medicine

## 2013-01-28 ENCOUNTER — Other Ambulatory Visit: Payer: Self-pay | Admitting: Internal Medicine

## 2013-02-08 ENCOUNTER — Other Ambulatory Visit: Payer: Self-pay | Admitting: *Deleted

## 2013-02-08 DIAGNOSIS — E785 Hyperlipidemia, unspecified: Secondary | ICD-10-CM

## 2013-02-08 MED ORDER — OXYCODONE HCL 10 MG PO TABS: 10.0000 mg | ORAL_TABLET | Freq: Four times a day (QID) | ORAL | Status: DC | PRN

## 2013-02-08 MED ORDER — ATORVASTATIN CALCIUM 10 MG PO TABS: 10.0000 mg | ORAL_TABLET | ORAL | Status: DC

## 2013-02-18 ENCOUNTER — Other Ambulatory Visit: Payer: Self-pay | Admitting: Internal Medicine

## 2013-02-20 ENCOUNTER — Encounter: Payer: Self-pay | Admitting: Internal Medicine

## 2013-02-20 ENCOUNTER — Ambulatory Visit (INDEPENDENT_AMBULATORY_CARE_PROVIDER_SITE_OTHER): Payer: Medicare Other | Admitting: Internal Medicine

## 2013-02-20 VITALS — BP 140/56 | HR 75 | Ht 70.5 in | Wt 177.8 lb

## 2013-02-20 DIAGNOSIS — Z950 Presence of cardiac pacemaker: Secondary | ICD-10-CM

## 2013-02-20 DIAGNOSIS — I4891 Unspecified atrial fibrillation: Secondary | ICD-10-CM

## 2013-02-20 HISTORY — DX: Presence of cardiac pacemaker: Z95.0

## 2013-02-20 LAB — PACEMAKER DEVICE OBSERVATION
AL IMPEDENCE PM: 468 Ohm
AL THRESHOLD: 1.6 V
DEVICE MODEL PM: 7244744
RV LEAD IMPEDENCE PM: 448 Ohm
RV LEAD THRESHOLD: 0.8 V
VENTRICULAR PACING PM: 100

## 2013-02-20 NOTE — Assessment & Plan Note (Signed)
Stable on coumadinI

## 2013-02-20 NOTE — Progress Notes (Signed)
Patient Care Team: Stacie Glaze, MD as PCP - General   HPI  [name] is a 78 y.o. male Seen in followup for pacemaker implanted 2012   He has atrial fibrillation and had a stroke/TIA 22,014  He is doing relatively well without complaints of significant SOB  Or chest pain    Past Medical History  Diagnosis Date  . Fatigue   . Overweight   . Syncope and collapse   . GERD (gastroesophageal reflux disease)   . Right bundle branch block   . Unspecified venous (peripheral) insufficiency   . Phlebitis and thrombophlebitis of superficial vessels of lower extremities   . Cellulitis and abscess of leg, except foot   . Backache, unspecified   . Long term (current) use of anticoagulants   . Type II or unspecified type diabetes mellitus without mention of complication, not stated as uncontrolled   . Unspecified venous (peripheral) insufficiency   . Hypertension   . Gout, unspecified   . Heart failure   . Atrial fibrillation     permanent  . Esophageal stricture   . Hx of colonic polyps 05-2006    (Adenomatous)Dr. Arlyce Dice  . Diverticulosis 05-2006    Dr. Arlyce Dice   . Tachycardia-bradycardia     s/p PPM    Past Surgical History  Procedure Laterality Date  . Laminectomy    . Lumbar fusion    . Rotator cuff repair    . Ruptured rt rectus muscle    . Eye surgery    . Pacemaker insertion  8/12    SJM by Dr Johney Frame for tachy/brady syndrome    Current Outpatient Prescriptions  Medication Sig Dispense Refill  . atorvastatin (LIPITOR) 10 MG tablet Take 1 tablet (10 mg total) by mouth every other day.  30 tablet  11  . B-D ULTRAFINE III SHORT PEN 31G X 8 MM MISC USE AS DIRECTED.  100 each  6  . DIGOX 0.125 MG tablet TAKE 1 TABLET ONCE DAILY.  60 tablet  1  . diltiazem (CARDIZEM CD) 240 MG 24 hr capsule Take 240 mg by mouth daily.      Marland Kitchen FREESTYLE LITE test strip CHECK BLOOD SUGAR 1 TO 2 TIMES A DAY.  100 each  11  . furosemide (LASIX) 40 MG tablet Take 1 tablet (40 mg  total) by mouth daily.  60 tablet  6  . insulin aspart (NOVOLOG) 100 UNIT/ML injection Inject into the skin 3 (three) times daily before meals. Sliding scale before meals-- if cbg is > 300 take 10 units:if 200-300 use 5 units- if < than 200 no insulin      . insulin glargine (LANTUS) 100 UNIT/ML injection Inject 25 Units into the skin at bedtime.       . metFORMIN (GLUCOPHAGE) 500 MG tablet Take 500 mg by mouth 2 (two) times daily with a meal.      . omeprazole (PRILOSEC) 20 MG capsule Take 20 mg by mouth 2 (two) times daily.      . Oxycodone HCl 10 MG TABS Take 1 tablet (10 mg total) by mouth every 6 (six) hours as needed.  120 tablet  0  . PRADAXA 150 MG CAPS TAKE 1 CAPSULE EVERY TWELVE HOURS.  60 capsule  11  . thiamine 100 MG tablet Take 1 tablet (100 mg total) by mouth daily.  30 tablet  0  . traMADol (ULTRAM) 50 MG tablet Take 1 tablet (50 mg total) by mouth every 8 (eight) hours  as needed for pain.  90 tablet  3  . UNABLE TO FIND Outpatient physical therapy for TIA.  1 Units  0   No current facility-administered medications for this visit.    Allergies  Allergen Reactions  . Morphine Rash    REACTION:irritability    Review of Systems negative except from HPI and PMH  Physical Exam BP 140/56  Pulse 75  Ht 5' 10.5" (1.791 m)  Wt 177 lb 12.8 oz (80.65 kg)  BMI 25.14 kg/m2 Well developed and nourished in no acute distress HENT normal Neck supple with JVP-flat Clear Regular rate and rhythm, no murmurs or gallops Abd-soft with active BS No Clubbing cyanosis edema Skin-warm and dry A & Oriented  Grossly normal sensory and motor function    Assessment and  Plan

## 2013-02-20 NOTE — Patient Instructions (Addendum)
Your physician wants you to follow-up in: October.   You will receive a reminder letter in the mail two months in advance. If you don't receive a letter, please call our office to schedule the follow-up appointment.

## 2013-02-21 ENCOUNTER — Other Ambulatory Visit: Payer: Self-pay | Admitting: Internal Medicine

## 2013-02-22 ENCOUNTER — Ambulatory Visit: Payer: Medicare Other | Admitting: Internal Medicine

## 2013-03-01 ENCOUNTER — Ambulatory Visit (INDEPENDENT_AMBULATORY_CARE_PROVIDER_SITE_OTHER): Payer: Medicare Other | Admitting: Family Medicine

## 2013-03-01 ENCOUNTER — Encounter: Payer: Self-pay | Admitting: Family Medicine

## 2013-03-01 VITALS — BP 150/80 | Temp 98.8°F | Wt 184.0 lb

## 2013-03-01 DIAGNOSIS — R600 Localized edema: Secondary | ICD-10-CM

## 2013-03-01 DIAGNOSIS — I872 Venous insufficiency (chronic) (peripheral): Secondary | ICD-10-CM

## 2013-03-01 DIAGNOSIS — R609 Edema, unspecified: Secondary | ICD-10-CM

## 2013-03-01 LAB — HEPATIC FUNCTION PANEL
Albumin: 3.5 g/dL (ref 3.5–5.2)
Total Protein: 6.6 g/dL (ref 6.0–8.3)

## 2013-03-01 LAB — BASIC METABOLIC PANEL
BUN: 13 mg/dL (ref 6–23)
CO2: 25 mEq/L (ref 19–32)
Calcium: 8.5 mg/dL (ref 8.4–10.5)
Creatinine, Ser: 1.1 mg/dL (ref 0.4–1.5)
GFR: 69.23 mL/min (ref >60.00)
Glucose, Bld: 282 mg/dL — ABNORMAL HIGH (ref 70–99)
Sodium: 128 mEq/L — ABNORMAL LOW (ref 135–145)

## 2013-03-01 LAB — POCT URINALYSIS DIPSTICK
Bilirubin, UA: NEGATIVE
Leukocytes, UA: NEGATIVE
Nitrite, UA: NEGATIVE
Urobilinogen, UA: 0.2

## 2013-03-01 MED ORDER — FUROSEMIDE 40 MG PO TABS: 40.0000 mg | ORAL_TABLET | Freq: Every day | ORAL | Status: DC

## 2013-03-01 NOTE — Progress Notes (Signed)
Subjective:    Patient ID: [name], male    DOB: [birth year], 78 y.o.   MRN: [id number]  HPI Acute visit. Patient has multiple chronic problems including history of type 2 diabetes, hypertension, atrial fibrillation, venous insufficiency, GERD, chronic back pain, history of TIAs. He is seen with increased bilateral leg edema over the past couple of weeks.  His weight is up 9 pounds since last office visit 12/26/2012.  He states his edema has especially worsened past couple days. He denies any dyspnea. No recent nonsteroidals. History of normal renal function. No recent dietary changes. No new medications. Albumin 3.4 back in January. Normal TSH last August. He tried doubling up his Lasix yesterday without much improvement  Echocardiogram in January of this year no systolic dysfunction  Past Medical History  Diagnosis Date  . Fatigue   . Overweight   . Syncope and collapse   . GERD (gastroesophageal reflux disease)   . Right bundle branch block   . Unspecified venous (peripheral) insufficiency   . Phlebitis and thrombophlebitis of superficial vessels of lower extremities   . Cellulitis and abscess of leg, except foot   . Backache, unspecified   . Long term (current) use of anticoagulants   . Type II or unspecified type diabetes mellitus without mention of complication, not stated as uncontrolled   . Unspecified venous (peripheral) insufficiency   . Hypertension   . Gout, unspecified   . Heart failure   . Atrial fibrillation     permanent  . Esophageal stricture   . Hx of colonic polyps 05-2006    (Adenomatous)Dr. Arlyce Dice  . Diverticulosis 05-2006    Dr. Arlyce Dice   . Tachycardia-bradycardia     s/p PPM  . Pacemaker- [name] 02/20/2013   Past Surgical History  Procedure Laterality Date  . Laminectomy    . Lumbar fusion    . Rotator cuff repair    . Ruptured rt rectus muscle    . Eye surgery    . Pacemaker insertion  8/12    SJM by Dr Johney Frame for tachy/brady syndrome     reports that he has never smoked. He has never used smokeless tobacco. He reports that  drinks alcohol. He reports that he does not use illicit drugs. family history includes Diabetes in his father; Heart disease in his father; and Pneumonia in his mother.  There is no history of Colon cancer. Allergies  Allergen Reactions  . Morphine Rash    REACTION:irritability      Review of Systems  Constitutional: Negative for fever, chills, appetite change and unexpected weight change.  Respiratory: Negative for cough, shortness of breath and wheezing.   Cardiovascular: Positive for leg swelling. Negative for chest pain and palpitations.  Gastrointestinal: Negative for abdominal pain.  Endocrine: Negative for polyuria.  Genitourinary: Negative for dysuria.  Neurological: Negative for dizziness and syncope.  Psychiatric/Behavioral: Negative for confusion.       Objective:   Physical Exam  Constitutional: He appears well-developed and well-nourished.  Neck: Neck supple.  Cardiovascular:  Irregular rhythm rate controlled-known history of atrial fibrillation  Pulmonary/Chest: Effort normal and breath sounds normal. No respiratory distress. He has no wheezes. He has no rales.  Musculoskeletal: He exhibits edema.  Patient has 1+ pitting edema legs and ankles bilaterally. No ulcerations.          Assessment & Plan:  Bilateral leg edema and weight gain. No history of systolic dysfunction. Reported history of venous stasis. Check labs with basic metabolic  panel, BNP, hepatic panel, urine dipstick. Increase furosemide to 40 mg twice daily. Reassess one week. Elevate legs frequently

## 2013-03-01 NOTE — Patient Instructions (Signed)
Increase furosemide to one twice daily. Elevate legs frequently.

## 2013-03-04 ENCOUNTER — Ambulatory Visit (INDEPENDENT_AMBULATORY_CARE_PROVIDER_SITE_OTHER): Payer: Medicare Other | Admitting: Family Medicine

## 2013-03-04 ENCOUNTER — Encounter: Payer: Self-pay | Admitting: Family Medicine

## 2013-03-04 VITALS — BP 130/60 | Temp 98.8°F | Wt 180.0 lb

## 2013-03-04 DIAGNOSIS — R600 Localized edema: Secondary | ICD-10-CM

## 2013-03-04 DIAGNOSIS — R609 Edema, unspecified: Secondary | ICD-10-CM

## 2013-03-04 NOTE — Progress Notes (Signed)
  Subjective:    Patient ID: [name], male    DOB: 03/19/1935, 78 y.o.   MRN: [id number]  HPI Patient seen for followup peripheral edema. Refer to last note BNP levels normal. Sodium 128 with potassium 3.5. We increased his furosemide to 40 mg twice daily. Weight down 4 pounds since then and ear his baseline. No orthostasis. Generally watches his sodium intake very carefully. Overall feels better  Past Medical History  Diagnosis Date  . Fatigue   . Overweight   . Syncope and collapse   . GERD (gastroesophageal reflux disease)   . Right bundle branch block   . Unspecified venous (peripheral) insufficiency   . Phlebitis and thrombophlebitis of superficial vessels of lower extremities   . Cellulitis and abscess of leg, except foot   . Backache, unspecified   . Long term (current) use of anticoagulants   . Type II or unspecified type diabetes mellitus without mention of complication, not stated as uncontrolled   . Unspecified venous (peripheral) insufficiency   . Hypertension   . Gout, unspecified   . Heart failure   . Atrial fibrillation     permanent  . Esophageal stricture   . Hx of colonic polyps 05-2006    (Adenomatous)Dr. Arlyce Dice  . Diverticulosis 05-2006    Dr. Arlyce Dice   . Tachycardia-bradycardia     s/p PPM  . Pacemaker- [name] 02/20/2013   Past Surgical History  Procedure Laterality Date  . Laminectomy    . Lumbar fusion    . Rotator cuff repair    . Ruptured rt rectus muscle    . Eye surgery    . Pacemaker insertion  8/12    SJM by Dr Johney Frame for tachy/brady syndrome    reports that he has never smoked. He has never used smokeless tobacco. He reports that  drinks alcohol. He reports that he does not use illicit drugs. family history includes Diabetes in his father; Heart disease in his father; and Pneumonia in his mother.  There is no history of Colon cancer. Allergies  Allergen Reactions  . Morphine Rash    REACTION:irritability       Review of  Systems  Respiratory: Negative for cough and shortness of breath.   Cardiovascular: Positive for leg swelling. Negative for chest pain and palpitations.       Objective:   Physical Exam  Constitutional: He appears well-developed and well-nourished.  Cardiovascular: Normal rate and regular rhythm.   Pulmonary/Chest: Effort normal and breath sounds normal. No respiratory distress. He has no wheezes. He has no rales.  Musculoskeletal: He exhibits edema.  Patient has trace edema lower legs bilaterally. He has mild asymmetry left leg greater than right this has been present in the past. Venous Dopplers were negative last November          Assessment & Plan:     Bilateral leg edema. No evidence for CHF. Suspect largely venous stasis related. Has been reluctant to use compression hose. Drop furosemide back to 40 mg once daily. Reassess one week. Check basic metabolic panel then. Elevate legs frequently

## 2013-03-04 NOTE — Patient Instructions (Signed)
Venous Stasis and Chronic Venous Insufficiency As people age, the veins located in their legs may weaken and stretch. When veins weaken and lose the ability to pump blood effectively, the condition is called chronic venous insufficiency (CVI) or venous stasis. Almost all veins return blood back to the heart. This happens by:  The force of the heart pumping fresh blood pushes blood back to the heart.  Blood flowing to the heart from the force of gravity. In the deep veins of the legs, blood has to fight gravity and flow upstream back to the heart. Here, the leg muscles contract to pump blood back toward the heart. Vein walls are elastic, and many veins have small valves that only allow blood to flow in one direction. When leg muscles contract, they push inward against the elastic vein walls. This squeezes blood upward, opens the valves, and moves blood toward the heart. When leg muscles relax, the vein wall also relaxes and the valves inside the vein close to prevent blood from flowing backward. This method of pumping blood out of the legs is called the venous pump. CAUSES  The venous pump works best while walking and leg muscles are contracting. But when a person sits or stands, blood pressure in leg veins can build. Deep veins are usually able to withstand short periods of inactivity, but long periods of inactivity (and increased pressure) can stretch, weaken, and damage vein walls. High blood pressure can also stretch and damage vein walls. The veins may no longer be able to pump blood back to the heart. Venous hypertension (high blood pressure inside veins) that lasts over time is a primary cause of CVI. CVI can also be caused by:   Deep vein thrombosis, a condition where a thrombus (blood clot) blocks blood flow in a vein.  Phlebitis, an inflammation of a superficial vein that causes a blood clot to form. Other risk factors for CVI may include:   Heredity.  Obesity.  Pregnancy.  Sedentary  lifestyle.  Smoking.  Jobs requiring long periods of standing or sitting in one place.  Age and gender:  Women in their 40's and 50's and men in their 70's are more prone to developing CVI. SYMPTOMS  Symptoms of CVI may include:   Varicose veins.  Ulceration or skin breakdown.  Lipodermatosclerosis, a condition that affects the skin just above the ankle, usually on the inside surface. Over time the skin becomes brown, smooth, tight and often painful. Those with this condition have a high risk of developing skin ulcers.  Reddened or discolored skin on the leg.  Swelling. DIAGNOSIS  Your caregiver can diagnose CVI after performing a careful medical history and physical examination. To confirm the diagnosis, the following tests may also be ordered:   Duplex ultrasound.  Plethysmography (tests blood flow).  Venograms (x-ray using a special dye). TREATMENT The goals of treatment for CVI are to restore a person to an active life and to minimize pain or disability. Typically, CVI does not pose a serious threat to life or limb, and with proper treatment most people with this condition can continue to lead active lives. In most cases, mild CVI can be treated on an outpatient basis with simple procedures. Treatment methods include:   Elastic compression socks.  Sclerotherapy, a procedure involving an injection of a material that "dissolves" the damaged veins. Other veins in the network of blood vessels take over the function of the damaged veins.  Vein stripping (an older procedure less commonly used).    Laser Ablation surgery.  Valve repair. HOME CARE INSTRUCTIONS   Elastic compression socks must be worn every day. They can help with symptoms and lower the chances of the problem getting worse, but they do not cure the problem.  Only take over-the-counter or prescription medicines for pain, discomfort, or fever as directed by your caregiver.  Your caregiver will review your other  medications with you. SEEK MEDICAL CARE IF:   You are confused about how to take your medications.  There is redness, swelling, or increasing pain in the affected area.  There is a red streak or line that extends up or down from the affected area.  There is a breakdown or loss of skin in the affected area, even if the breakdown is small.  You develop an unexplained oral temperature above 102 F (38.9 C).  There is an injury to the affected area. SEEK IMMEDIATE MEDICAL CARE IF:   There is an injury and open wound to the affected area.  Pain is not adequately relieved with pain medication prescribed or becomes severe.  An oral temperature above 102 F (38.9 C) develops.  The foot/ankle below the affected area becomes suddenly numb or the area feels weak and hard to move. MAKE SURE YOU:   Understand these instructions.  Will watch your condition.  Will get help right away if you are not doing well or get worse. Document Released: 03/13/2007 Document Revised: 01/30/2012 Document Reviewed: 05/21/2007 [employer] Patient Information 2013 [employer].  

## 2013-03-11 ENCOUNTER — Encounter: Payer: Self-pay | Admitting: Family Medicine

## 2013-03-11 ENCOUNTER — Ambulatory Visit (INDEPENDENT_AMBULATORY_CARE_PROVIDER_SITE_OTHER): Payer: Medicare Other | Admitting: Family Medicine

## 2013-03-11 VITALS — BP 110/70 | Temp 98.8°F | Wt 184.0 lb

## 2013-03-11 DIAGNOSIS — R609 Edema, unspecified: Secondary | ICD-10-CM

## 2013-03-11 DIAGNOSIS — R600 Localized edema: Secondary | ICD-10-CM

## 2013-03-11 DIAGNOSIS — E871 Hypo-osmolality and hyponatremia: Secondary | ICD-10-CM

## 2013-03-11 LAB — BASIC METABOLIC PANEL
BUN: 15 mg/dL (ref 6–23)
Calcium: 9.1 mg/dL (ref 8.4–10.5)
GFR: 59.73 mL/min — ABNORMAL LOW (ref >60.00)
Glucose, Bld: 312 mg/dL — ABNORMAL HIGH (ref 70–99)
Potassium: 4.2 mEq/L (ref 3.5–5.1)
Sodium: 133 mEq/L — ABNORMAL LOW (ref 135–145)

## 2013-03-11 NOTE — Progress Notes (Signed)
  Subjective:    Patient ID: [name], male    DOB: 11-Dec-1934, 78 y.o.   MRN: [id number]  HPI Patient seen for followup peripheral edema issues. Refer to prior note. We increased his furosemide to 40 mg twice daily and he promptly diuresed about 3-4 pounds and felt symptomatically better. Since resuming furosemide 40 mg once daily his weight has gone back up. No dyspnea. Recent BNP level 100.  Overall feels well. Recent sodium 128 with potassium 3.5. He's not taking a thiazide diuretic.  Past Medical History  Diagnosis Date  . Fatigue   . Overweight   . Syncope and collapse   . GERD (gastroesophageal reflux disease)   . Right bundle branch block   . Unspecified venous (peripheral) insufficiency   . Phlebitis and thrombophlebitis of superficial vessels of lower extremities   . Cellulitis and abscess of leg, except foot   . Backache, unspecified   . Long term (current) use of anticoagulants   . Type II or unspecified type diabetes mellitus without mention of complication, not stated as uncontrolled   . Unspecified venous (peripheral) insufficiency   . Hypertension   . Gout, unspecified   . Heart failure   . Atrial fibrillation     permanent  . Esophageal stricture   . Hx of colonic polyps 05-2006    (Adenomatous)Dr. Arlyce Dice  . Diverticulosis 05-2006    Dr. Arlyce Dice   . Tachycardia-bradycardia     s/p PPM  . Pacemaker- [name] 02/20/2013   Past Surgical History  Procedure Laterality Date  . Laminectomy    . Lumbar fusion    . Rotator cuff repair    . Ruptured rt rectus muscle    . Eye surgery    . Pacemaker insertion  8/12    SJM by Dr Johney Frame for tachy/brady syndrome    reports that he has never smoked. He has never used smokeless tobacco. He reports that  drinks alcohol. He reports that he does not use illicit drugs. family history includes Diabetes in his father; Heart disease in his father; and Pneumonia in his mother.  There is no history of Colon  cancer. Allergies  Allergen Reactions  . Morphine Rash    REACTION:irritability      Review of Systems  Constitutional: Negative for fatigue.  Eyes: Negative for visual disturbance.  Respiratory: Negative for cough, chest tightness and shortness of breath.   Cardiovascular: Positive for leg swelling. Negative for chest pain and palpitations.  Neurological: Negative for dizziness, syncope, weakness, light-headedness and headaches.       Objective:   Physical Exam  Constitutional: He appears well-developed and well-nourished.  Neck: Neck supple.  Cardiovascular: Normal rate and regular rhythm.   Pulmonary/Chest: Effort normal and breath sounds normal. No respiratory distress. He has no wheezes. He has no rales.  Musculoskeletal: He exhibits edema.          Assessment & Plan:  Peripheral edema. Hyponatremia. Recheck basic metabolic panel. Increased furosemide back to 40 mg morning and 40 mg afternoon. He is encouraged to gets close followup with primary but is having difficulty getting in at this time

## 2013-03-12 ENCOUNTER — Telehealth: Payer: Self-pay | Admitting: Internal Medicine

## 2013-03-12 NOTE — Telephone Encounter (Signed)
Nancy,dr burchette's nurse called with results

## 2013-03-12 NOTE — Telephone Encounter (Signed)
Pt would like the results of his labs from 03/11/13. Please assist.

## 2013-03-12 NOTE — Progress Notes (Signed)
Quick Note:  Pt informed and he voiced understanding ______

## 2013-03-12 NOTE — Telephone Encounter (Signed)
PT stated that he needed to fu with Dr. Lovell Sheehan within 2 weeks, please assist.

## 2013-03-13 NOTE — Telephone Encounter (Signed)
Left message on machine Appointment for 03/21/13/at 12;30

## 2013-03-21 ENCOUNTER — Encounter: Payer: Self-pay | Admitting: Internal Medicine

## 2013-03-21 ENCOUNTER — Ambulatory Visit (INDEPENDENT_AMBULATORY_CARE_PROVIDER_SITE_OTHER): Payer: Medicare Other | Admitting: Internal Medicine

## 2013-03-21 VITALS — BP 140/80 | HR 76 | Temp 98.6°F | Resp 16 | Ht 70.5 in | Wt 182.0 lb

## 2013-03-21 DIAGNOSIS — I8310 Varicose veins of unspecified lower extremity with inflammation: Secondary | ICD-10-CM

## 2013-03-21 DIAGNOSIS — M159 Polyosteoarthritis, unspecified: Secondary | ICD-10-CM

## 2013-03-21 DIAGNOSIS — I872 Venous insufficiency (chronic) (peripheral): Secondary | ICD-10-CM

## 2013-03-21 DIAGNOSIS — I509 Heart failure, unspecified: Secondary | ICD-10-CM

## 2013-03-21 DIAGNOSIS — I4891 Unspecified atrial fibrillation: Secondary | ICD-10-CM

## 2013-03-21 MED ORDER — OXYCODONE HCL 10 MG PO TABS: 10.0000 mg | ORAL_TABLET | Freq: Four times a day (QID) | ORAL | Status: DC | PRN

## 2013-03-21 MED ORDER — APIXABAN 5 MG PO TABS: 5.0000 mg | ORAL_TABLET | Freq: Two times a day (BID) | ORAL | Status: DC

## 2013-03-21 MED ORDER — DICLOFENAC SODIUM 1 % TD GEL: 2.0000 g | Freq: Four times a day (QID) | TRANSDERMAL | Status: DC

## 2013-03-21 NOTE — Patient Instructions (Signed)
Change to a new oral drug

## 2013-03-21 NOTE — Progress Notes (Signed)
Subjective:    Patient ID: [name], male    DOB: 31-Dec-1934, 78 y.o.   MRN: [id number]  HPI Significant improvement in leg edema with increased furosemide Change in anticoagulation from coumadin to pradaxa Cost is prohibitive    Review of Systems  Constitutional: Negative for fever and fatigue.  HENT: Negative for hearing loss, congestion, neck pain and postnasal drip.   Eyes: Negative for discharge, redness and visual disturbance.  Respiratory: Negative for cough, shortness of breath and wheezing.   Cardiovascular: Negative for leg swelling.  Gastrointestinal: Negative for abdominal pain, constipation and abdominal distention.  Genitourinary: Negative for urgency and frequency.  Musculoskeletal: Negative for joint swelling and arthralgias.  Skin: Negative for color change and rash.  Neurological: Negative for weakness and light-headedness.  Hematological: Negative for adenopathy.  Psychiatric/Behavioral: Negative for behavioral problems.   Past Medical History  Diagnosis Date  . Fatigue   . Overweight   . Syncope and collapse   . GERD (gastroesophageal reflux disease)   . Right bundle branch block   . Unspecified venous (peripheral) insufficiency   . Phlebitis and thrombophlebitis of superficial vessels of lower extremities   . Cellulitis and abscess of leg, except foot   . Backache, unspecified   . Long term (current) use of anticoagulants   . Type II or unspecified type diabetes mellitus without mention of complication, not stated as uncontrolled   . Unspecified venous (peripheral) insufficiency   . Hypertension   . Gout, unspecified   . Heart failure   . Atrial fibrillation     permanent  . Esophageal stricture   . Hx of colonic polyps 05-2006    (Adenomatous)Dr. Arlyce Dice  . Diverticulosis 05-2006    Dr. Arlyce Dice   . Tachycardia-bradycardia     s/p PPM  . Pacemaker- [name] 02/20/2013    History   Social History  . Marital Status: Married    Spouse  Name: [name]    Number of Children: 2  . Years of Education: [name]   Occupational History  . Retired     Social History Main Topics  . Smoking status: Never Smoker   . Smokeless tobacco: Never Used  . Alcohol Use: Yes     Comment: two drinks daily   . Drug Use: No  . Sexually Active: Yes   Other Topics Concern  . Not on file   Social History Narrative   2 caffeine drinks daily     Past Surgical History  Procedure Laterality Date  . Laminectomy    . Lumbar fusion    . Rotator cuff repair    . Ruptured rt rectus muscle    . Eye surgery    . Pacemaker insertion  8/12    SJM by Dr Johney Frame for tachy/brady syndrome    Family History  Problem Relation Age of Onset  . Pneumonia Mother   . Heart disease Father   . Diabetes Father   . Colon cancer Neg Hx     Allergies  Allergen Reactions  . Morphine Rash    REACTION:irritability    Current Outpatient Prescriptions on File Prior to Visit  Medication Sig Dispense Refill  . atorvastatin (LIPITOR) 10 MG tablet Take 1 tablet (10 mg total) by mouth every other day.  30 tablet  11  . B-D ULTRAFINE III SHORT PEN 31G X 8 MM MISC USE AS DIRECTED.  100 each  6  . DIGOX 0.125 MG tablet TAKE 1 TABLET ONCE DAILY.  60 tablet  1  . diltiazem (CARDIZEM CD) 240 MG 24 hr capsule Take 240 mg by mouth daily.      Marland Kitchen FREESTYLE LITE test strip CHECK BLOOD SUGAR 1 TO 2 TIMES A DAY.  100 each  11  . furosemide (LASIX) 40 MG tablet Take 1 tablet (40 mg total) by mouth daily.  60 tablet  6  . insulin aspart (NOVOLOG) 100 UNIT/ML injection Inject into the skin 3 (three) times daily before meals. Sliding scale before meals-- if cbg is > 300 take 10 units:if 200-300 use 5 units- if < than 200 no insulin      . insulin glargine (LANTUS) 100 UNIT/ML injection Inject 25 Units into the skin at bedtime.       . metFORMIN (GLUCOPHAGE) 500 MG tablet Take 500 mg by mouth 2 (two) times daily with a meal.      . omeprazole (PRILOSEC) 20 MG capsule TAKE (1) CAPSULE  TWICE DAILY.  60 capsule  3  . thiamine 100 MG tablet Take 1 tablet (100 mg total) by mouth daily.  30 tablet  0  . traMADol (ULTRAM) 50 MG tablet Take 1 tablet (50 mg total) by mouth every 8 (eight) hours as needed for pain.  90 tablet  3  . UNABLE TO FIND Outpatient physical therapy for TIA.  1 Units  0   No current facility-administered medications on file prior to visit.    BP 140/80  Pulse 76  Temp(Src) 98.6 F (37 C)  Resp 16  Ht 5' 10.5" (1.791 m)  Wt 182 lb (82.555 kg)  BMI 25.74 kg/m2        Objective:   Physical Exam  Nursing note and vitals reviewed. Constitutional: He appears well-developed and well-nourished.  HENT:  Head: Normocephalic and atraumatic.  Eyes: Conjunctivae are normal. Pupils are equal, round, and reactive to light.  Neck: Normal range of motion. Neck supple.  Cardiovascular: Normal rate and regular rhythm.   Pulmonary/Chest: Effort normal and breath sounds normal.  Abdominal: Soft. Bowel sounds are normal.  Musculoskeletal: He exhibits edema and tenderness.  Extremity edema much improved with venous stasis dermatitis resolving.    Skin: Skin is warm and dry. Rash noted. There is erythema.          Assessment & Plan:  Resolving venous stasis dermatitis with increased furosemide.  Etiology of lower extremity edema venous incompetence in atrial fibrillation with ejection fraction  Patient has a history of atrial fibrillation and was on Coumadin for anticoagulation was changed to an oral anticoagulant pradaxa the cost is prohibitive

## 2013-03-27 ENCOUNTER — Other Ambulatory Visit: Payer: Self-pay | Admitting: *Deleted

## 2013-03-27 MED ORDER — DILTIAZEM HCL ER COATED BEADS 240 MG PO CP24: 240.0000 mg | ORAL_CAPSULE | Freq: Every day | ORAL | Status: DC

## 2013-04-04 ENCOUNTER — Other Ambulatory Visit: Payer: Self-pay | Admitting: *Deleted

## 2013-04-04 DIAGNOSIS — I4891 Unspecified atrial fibrillation: Secondary | ICD-10-CM

## 2013-04-04 MED ORDER — APIXABAN 5 MG PO TABS: 5.0000 mg | ORAL_TABLET | Freq: Two times a day (BID) | ORAL | Status: DC

## 2013-04-05 ENCOUNTER — Other Ambulatory Visit: Payer: Self-pay | Admitting: *Deleted

## 2013-04-05 MED ORDER — FUROSEMIDE 40 MG PO TABS: 40.0000 mg | ORAL_TABLET | Freq: Two times a day (BID) | ORAL | Status: DC

## 2013-04-09 ENCOUNTER — Encounter: Payer: Self-pay | Admitting: Internal Medicine

## 2013-04-09 ENCOUNTER — Other Ambulatory Visit: Payer: Self-pay | Admitting: *Deleted

## 2013-04-09 ENCOUNTER — Ambulatory Visit (INDEPENDENT_AMBULATORY_CARE_PROVIDER_SITE_OTHER): Payer: Medicare Other | Admitting: Internal Medicine

## 2013-04-09 VITALS — BP 120/60 | HR 75 | Temp 98.6°F | Resp 20 | Wt 187.0 lb

## 2013-04-09 DIAGNOSIS — I4891 Unspecified atrial fibrillation: Secondary | ICD-10-CM

## 2013-04-09 DIAGNOSIS — I872 Venous insufficiency (chronic) (peripheral): Secondary | ICD-10-CM

## 2013-04-09 MED ORDER — FUROSEMIDE 40 MG PO TABS: ORAL_TABLET | ORAL | Status: DC

## 2013-04-09 MED ORDER — POTASSIUM CHLORIDE CRYS ER 20 MEQ PO TBCR
20.0000 meq | EXTENDED_RELEASE_TABLET | Freq: Every day | ORAL | Status: DC
Start: 2013-04-09 — End: 2014-01-16

## 2013-04-09 NOTE — Patient Instructions (Addendum)
Limit your sodium (Salt) intake  Keep her legs elevated as much as possible  Consider compression hose if unimprovedVenous Stasis and Chronic Venous Insufficiency As people age, the veins located in their legs may weaken and stretch. When veins weaken and lose the ability to pump blood effectively, the condition is called chronic venous insufficiency (CVI) or venous stasis. Almost all veins return blood back to the heart. This happens by:  The force of the heart pumping fresh blood pushes blood back to the heart.  Blood flowing to the heart from the force of gravity. In the deep veins of the legs, blood has to fight gravity and flow upstream back to the heart. Here, the leg muscles contract to pump blood back toward the heart. Vein walls are elastic, and many veins have small valves that only allow blood to flow in one direction. When leg muscles contract, they push inward against the elastic vein walls. This squeezes blood upward, opens the valves, and moves blood toward the heart. When leg muscles relax, the vein wall also relaxes and the valves inside the vein close to prevent blood from flowing backward. This method of pumping blood out of the legs is called the venous pump. CAUSES  The venous pump works best while walking and leg muscles are contracting. But when a person sits or stands, blood pressure in leg veins can build. Deep veins are usually able to withstand short periods of inactivity, but long periods of inactivity (and increased pressure) can stretch, weaken, and damage vein walls. High blood pressure can also stretch and damage vein walls. The veins may no longer be able to pump blood back to the heart. Venous hypertension (high blood pressure inside veins) that lasts over time is a primary cause of CVI. CVI can also be caused by:   Deep vein thrombosis, a condition where a thrombus (blood clot) blocks blood flow in a vein.  Phlebitis, an inflammation of a superficial vein that  causes a blood clot to form. Other risk factors for CVI may include:   Heredity.  Obesity.  Pregnancy.  Sedentary lifestyle.  Smoking.  Jobs requiring long periods of standing or sitting in one place.  Age and gender:  Women in their 67's and 33's and men in their 31's are more prone to developing CVI. SYMPTOMS  Symptoms of CVI may include:   Varicose veins.  Ulceration or skin breakdown.  Lipodermatosclerosis, a condition that affects the skin just above the ankle, usually on the inside surface. Over time the skin becomes brown, smooth, tight and often painful. Those with this condition have a high risk of developing skin ulcers.  Reddened or discolored skin on the leg.  Swelling. DIAGNOSIS  Your caregiver can diagnose CVI after performing a careful medical history and physical examination. To confirm the diagnosis, the following tests may also be ordered:   Duplex ultrasound.  Plethysmography (tests blood flow).  Venograms (x-ray using a special dye). TREATMENT The goals of treatment for CVI are to restore a person to an active life and to minimize pain or disability. Typically, CVI does not pose a serious threat to life or limb, and with proper treatment most people with this condition can continue to lead active lives. In most cases, mild CVI can be treated on an outpatient basis with simple procedures. Treatment methods include:   Elastic compression socks.  Sclerotherapy, a procedure involving an injection of a material that "dissolves" the damaged veins. Other veins in the network of blood  vessels take over the function of the damaged veins.  Vein stripping (an older procedure less commonly used).  Laser Ablation surgery.  Valve repair. HOME CARE INSTRUCTIONS   Elastic compression socks must be worn every day. They can help with symptoms and lower the chances of the problem getting worse, but they do not cure the problem.  Only take over-the-counter or  prescription medicines for pain, discomfort, or fever as directed by your caregiver.  Your caregiver will review your other medications with you. SEEK MEDICAL CARE IF:   You are confused about how to take your medications.  There is redness, swelling, or increasing pain in the affected area.  There is a red streak or line that extends up or down from the affected area.  There is a breakdown or loss of skin in the affected area, even if the breakdown is small.  You develop an unexplained oral temperature above 102 F (38.9 C).  There is an injury to the affected area. SEEK IMMEDIATE MEDICAL CARE IF:   There is an injury and open wound to the affected area.  Pain is not adequately relieved with pain medication prescribed or becomes severe.  An oral temperature above 102 F (38.9 C) develops.  The foot/ankle below the affected area becomes suddenly numb or the area feels weak and hard to move. MAKE SURE YOU:   Understand these instructions.  Will watch your condition.  Will get help right away if you are not doing well or get worse. Document Released: 03/13/2007 Document Revised: 01/30/2012 Document Reviewed: 05/21/2007 [facility] Patient Information 2013 [place], [place].

## 2013-04-09 NOTE — Progress Notes (Signed)
Subjective:    Patient ID: [name], male    DOB: 1934-12-27, 78 y.o.   MRN: [id number]  HPI   78 year old patient who has a history of chronic atrial fibrillation on chronic anticoagulation. He presently is on pradaxa but will be switching to Eliquis in a few days. He has a history of heart failure as well as chronic venous insufficiency. He has had worsening lower extremity edema for several weeks and presently his furosemide dose has been up titrated to 40 mg twice a day. He has been on this dose for 3 months. When last seen here 3 weeks ago his peripheral edema had improved. He complains some increasing swelling and some weeping from his lower extremities mainly on the right. He did have laboratory studies done 3 weeks ago that revealed a normal potassium and a slightly low serum sodium level. Denies any symptoms of heart failure other than the peripheral edema  Past Medical History  Diagnosis Date  . Fatigue   . Overweight   . Syncope and collapse   . GERD (gastroesophageal reflux disease)   . Right bundle branch block   . Unspecified venous (peripheral) insufficiency   . Phlebitis and thrombophlebitis of superficial vessels of lower extremities   . Cellulitis and abscess of leg, except foot   . Backache, unspecified   . Long term (current) use of anticoagulants   . Type II or unspecified type diabetes mellitus without mention of complication, not stated as uncontrolled   . Unspecified venous (peripheral) insufficiency   . Hypertension   . Gout, unspecified   . Heart failure   . Atrial fibrillation     permanent  . Esophageal stricture   . Hx of colonic polyps 05-2006    (Adenomatous)Dr. Arlyce Dice  . Diverticulosis 05-2006    Dr. Arlyce Dice   . Tachycardia-bradycardia     s/p PPM  . Pacemaker- [name] 02/20/2013    History   Social History  . Marital Status: Married    Spouse Name: [name]    Number of Children: 2  . Years of Education: [name]   Occupational History  .  Retired     Social History Main Topics  . Smoking status: Never Smoker   . Smokeless tobacco: Never Used  . Alcohol Use: Yes     Comment: two drinks daily   . Drug Use: No  . Sexually Active: Yes   Other Topics Concern  . Not on file   Social History Narrative   2 caffeine drinks daily     Past Surgical History  Procedure Laterality Date  . Laminectomy    . Lumbar fusion    . Rotator cuff repair    . Ruptured rt rectus muscle    . Eye surgery    . Pacemaker insertion  8/12    SJM by Dr Johney Frame for tachy/brady syndrome    Family History  Problem Relation Age of Onset  . Pneumonia Mother   . Heart disease Father   . Diabetes Father   . Colon cancer Neg Hx     Allergies  Allergen Reactions  . Morphine Rash    REACTION:irritability    Current Outpatient Prescriptions on File Prior to Visit  Medication Sig Dispense Refill  . apixaban (ELIQUIS) 5 MG TABS tablet Take 1 tablet (5 mg total) by mouth 2 (two) times daily.  60 tablet  11  . atorvastatin (LIPITOR) 10 MG tablet Take 1 tablet (10 mg total) by mouth every other  day.  30 tablet  11  . B-D ULTRAFINE III SHORT PEN 31G X 8 MM MISC USE AS DIRECTED.  100 each  6  . DIGOX 0.125 MG tablet TAKE 1 TABLET ONCE DAILY.  60 tablet  1  . diltiazem (CARDIZEM CD) 240 MG 24 hr capsule Take 1 capsule (240 mg total) by mouth daily.  30 capsule  5  . FREESTYLE LITE test strip CHECK BLOOD SUGAR 1 TO 2 TIMES A DAY.  100 each  11  . furosemide (LASIX) 40 MG tablet Take 1 tablet (40 mg total) by mouth 2 (two) times daily.  60 tablet  6  . insulin aspart (NOVOLOG) 100 UNIT/ML injection Inject into the skin 3 (three) times daily before meals. Sliding scale before meals-- if cbg is > 300 take 10 units:if 200-300 use 5 units- if < than 200 no insulin      . insulin glargine (LANTUS) 100 UNIT/ML injection Inject 25 Units into the skin at bedtime.       . metFORMIN (GLUCOPHAGE) 500 MG tablet Take 500 mg by mouth 2 (two) times daily with a meal.       . omeprazole (PRILOSEC) 20 MG capsule TAKE (1) CAPSULE TWICE DAILY.  60 capsule  3  . Oxycodone HCl 10 MG TABS Take 1 tablet (10 mg total) by mouth every 6 (six) hours as needed.  120 tablet  0  . traMADol (ULTRAM) 50 MG tablet Take 1 tablet (50 mg total) by mouth every 8 (eight) hours as needed for pain.  90 tablet  3  . UNABLE TO FIND Outpatient physical therapy for TIA.  1 Units  0   No current facility-administered medications on file prior to visit.    BP 120/60  Pulse 75  Temp(Src) 98.6 F (37 C) (Oral)  Resp 20  Wt 187 lb (84.823 kg)  BMI 26.44 kg/m2  SpO2 98%       Review of Systems  Constitutional: Negative for fever, chills, appetite change and fatigue.  HENT: Negative for hearing loss, ear pain, congestion, sore throat, trouble swallowing, neck stiffness, dental problem, voice change and tinnitus.   Eyes: Negative for pain, discharge and visual disturbance.  Respiratory: Negative for cough, chest tightness, wheezing and stridor.   Cardiovascular: Positive for leg swelling. Negative for chest pain and palpitations.  Gastrointestinal: Negative for nausea, vomiting, abdominal pain, diarrhea, constipation, blood in stool and abdominal distention.  Genitourinary: Negative for urgency, hematuria, flank pain, discharge, difficulty urinating and genital sores.  Musculoskeletal: Negative for myalgias, back pain, joint swelling, arthralgias and gait problem.  Skin: Negative for rash.  Neurological: Negative for dizziness, syncope, speech difficulty, weakness, numbness and headaches.  Hematological: Negative for adenopathy. Does not bruise/bleed easily.  Psychiatric/Behavioral: Negative for behavioral problems and dysphoric mood. The patient is not nervous/anxious.        Objective:   Physical Exam  Constitutional: He is oriented to person, place, and time. He appears well-developed.  HENT:  Head: Normocephalic.  Right Ear: External ear normal.  Left Ear: External  ear normal.  Eyes: Conjunctivae and EOM are normal.  Neck: Normal range of motion.  Cardiovascular: Normal rate and normal heart sounds.   Controlled ventricular response  Pulmonary/Chest: Breath sounds normal.  No rales O2 saturation 98  Abdominal: Bowel sounds are normal.  Musculoskeletal: Normal range of motion. He exhibits edema. He exhibits no tenderness.  Prominent lower extremity edema with both calves  tight and tense. Slight weeping from the anterior distal  lower leg but no active ulceration. No active cellulitis  Neurological: He is alert and oriented to person, place, and time.  Psychiatric: He has a normal mood and affect. His behavior is normal.          Assessment & Plan:   Chronic venous insufficiency. We'll increase diuretics to 120 twice a day short-term. We'll reassess in 3 weeks. Will place on potassium supplementation and check electrolytes in 3 weeks. Compression hose discussed which  the patient does not wish to consider at this time

## 2013-04-30 ENCOUNTER — Encounter: Payer: Self-pay | Admitting: Internal Medicine

## 2013-04-30 ENCOUNTER — Ambulatory Visit (INDEPENDENT_AMBULATORY_CARE_PROVIDER_SITE_OTHER): Payer: Medicare Other | Admitting: Internal Medicine

## 2013-04-30 VITALS — BP 150/70 | HR 78 | Temp 98.4°F | Resp 20 | Wt 182.0 lb

## 2013-04-30 DIAGNOSIS — I509 Heart failure, unspecified: Secondary | ICD-10-CM

## 2013-04-30 DIAGNOSIS — I872 Venous insufficiency (chronic) (peripheral): Secondary | ICD-10-CM

## 2013-04-30 DIAGNOSIS — I10 Essential (primary) hypertension: Secondary | ICD-10-CM

## 2013-04-30 DIAGNOSIS — I4891 Unspecified atrial fibrillation: Secondary | ICD-10-CM

## 2013-04-30 DIAGNOSIS — E785 Hyperlipidemia, unspecified: Secondary | ICD-10-CM

## 2013-04-30 MED ORDER — INSULIN ASPART 100 UNIT/ML ~~LOC~~ SOLN: SUBCUTANEOUS | Status: DC

## 2013-04-30 MED ORDER — FREESTYLE LANCETS MISC: 1.0000 | Freq: Every day | Status: DC | PRN

## 2013-04-30 MED ORDER — OXYCODONE HCL 10 MG PO TABS: 10.0000 mg | ORAL_TABLET | Freq: Four times a day (QID) | ORAL | Status: DC | PRN

## 2013-04-30 NOTE — Progress Notes (Signed)
Subjective:    Patient ID: [name], male    DOB: [birth year], 78 y.o.   MRN: [id number]  HPI  78 year old patient who is seen today in followup.  He was seen a few weeks ago and his furosemide dose was increased short-term do to worsening lower extremity edema. This was associated with the weekend from a superficial wound in the right anterolateral lower leg. For the past few weeks his weight is down 5 pounds in his lower extremity edema has improved. No other symptoms of heart failure. He does have a history of chronic venous insufficiency. He is now on Eliquis for chronic atrial fibrillation. He has diabetes mellitus which has been well-controlled He requests a refill of oxycodone which was provided today  Past Medical History  Diagnosis Date  . Fatigue   . Overweight(278.02)   . Syncope and collapse   . GERD (gastroesophageal reflux disease)   . Right bundle branch block   . Unspecified venous (peripheral) insufficiency   . Phlebitis and thrombophlebitis of superficial vessels of lower extremities   . Cellulitis and abscess of leg, except foot   . Backache, unspecified   . Long term (current) use of anticoagulants   . Type II or unspecified type diabetes mellitus without mention of complication, not stated as uncontrolled   . Unspecified venous (peripheral) insufficiency   . Hypertension   . Gout, unspecified   . Heart failure   . Atrial fibrillation     permanent  . Esophageal stricture   . Hx of colonic polyps 05-2006    (Adenomatous)Dr. Arlyce Dice  . Diverticulosis 05-2006    Dr. Arlyce Dice   . Tachycardia-bradycardia     s/p PPM  . Pacemaker- [name] 02/20/2013    History   Social History  . Marital Status: Married    Spouse Name: [name]    Number of Children: 2  . Years of Education: [name]   Occupational History  . Retired     Social History Main Topics  . Smoking status: Never Smoker   . Smokeless tobacco: Never Used  . Alcohol Use: Yes     Comment: two drinks  daily   . Drug Use: No  . Sexually Active: Yes   Other Topics Concern  . Not on file   Social History Narrative   2 caffeine drinks daily     Past Surgical History  Procedure Laterality Date  . Laminectomy    . Lumbar fusion    . Rotator cuff repair    . Ruptured rt rectus muscle    . Eye surgery    . Pacemaker insertion  8/12    SJM by Dr Johney Frame for tachy/brady syndrome    Family History  Problem Relation Age of Onset  . Pneumonia Mother   . Heart disease Father   . Diabetes Father   . Colon cancer Neg Hx     Allergies  Allergen Reactions  . Morphine Rash    REACTION:irritability    Current Outpatient Prescriptions on File Prior to Visit  Medication Sig Dispense Refill  . apixaban (ELIQUIS) 5 MG TABS tablet Take 1 tablet (5 mg total) by mouth 2 (two) times daily.  60 tablet  11  . atorvastatin (LIPITOR) 10 MG tablet Take 1 tablet (10 mg total) by mouth every other day.  30 tablet  11  . B-D ULTRAFINE III SHORT PEN 31G X 8 MM MISC USE AS DIRECTED.  100 each  6  . DIGOX 0.125 MG  tablet TAKE 1 TABLET ONCE DAILY.  60 tablet  1  . diltiazem (CARDIZEM CD) 240 MG 24 hr capsule Take 1 capsule (240 mg total) by mouth daily.  30 capsule  5  . FREESTYLE LITE test strip CHECK BLOOD SUGAR 1 TO 2 TIMES A DAY.  100 each  11  . furosemide (LASIX) 40 MG tablet 80 mg in the morning 40 mg early afternoon  60 tablet  6  . insulin glargine (LANTUS) 100 UNIT/ML injection Inject 25 Units into the skin at bedtime.       . metFORMIN (GLUCOPHAGE) 500 MG tablet Take 500 mg by mouth 2 (two) times daily with a meal.      . omeprazole (PRILOSEC) 20 MG capsule TAKE (1) CAPSULE TWICE DAILY.  60 capsule  3  . potassium chloride SA (K-DUR,KLOR-CON) 20 MEQ tablet Take 1 tablet (20 mEq total) by mouth daily.  30 tablet  3  . traMADol (ULTRAM) 50 MG tablet Take 1 tablet (50 mg total) by mouth every 8 (eight) hours as needed for pain.  90 tablet  3  . UNABLE TO FIND Outpatient physical therapy for TIA.   1 Units  0   No current facility-administered medications on file prior to visit.    BP 150/70  Pulse 78  Temp(Src) 98.4 F (36.9 C) (Oral)  Resp 20  Wt 182 lb (82.555 kg)  BMI 25.74 kg/m2  SpO2 97%        Review of Systems  Constitutional: Negative for fever, chills, appetite change and fatigue.  HENT: Negative for hearing loss, ear pain, congestion, sore throat, trouble swallowing, neck stiffness, dental problem, voice change and tinnitus.   Eyes: Negative for pain, discharge and visual disturbance.  Respiratory: Negative for cough, chest tightness, wheezing and stridor.   Cardiovascular: Positive for leg swelling. Negative for chest pain and palpitations.  Gastrointestinal: Negative for nausea, vomiting, abdominal pain, diarrhea, constipation, blood in stool and abdominal distention.  Genitourinary: Negative for urgency, hematuria, flank pain, discharge, difficulty urinating and genital sores.  Musculoskeletal: Positive for back pain and arthralgias. Negative for myalgias, joint swelling and gait problem.  Skin: Negative for rash.  Neurological: Negative for dizziness, syncope, speech difficulty, weakness, numbness and headaches.  Hematological: Negative for adenopathy. Does not bruise/bleed easily.  Psychiatric/Behavioral: Negative for behavioral problems and dysphoric mood. The patient is not nervous/anxious.        Objective:   Physical Exam  Constitutional: He is oriented to person, place, and time. He appears well-developed.  HENT:  Head: Normocephalic.  Right Ear: External ear normal.  Left Ear: External ear normal.  Eyes: Conjunctivae and EOM are normal.  Neck: Normal range of motion.  Cardiovascular: Normal rate and normal heart sounds.   Pulmonary/Chest: Breath sounds normal.  Abdominal: Bowel sounds are normal.  Musculoskeletal: Normal range of motion. He exhibits edema. He exhibits no tenderness.  Prominent lower extremity edema the left greater than the  right; a very superficial erosion noted involving his right anterolateral lower leg just proximal to the ankle;  it was weeping slightly. No signs of active cellulitis  Neurological: He is alert and oriented to person, place, and time.  Psychiatric: He has a normal mood and affect. His behavior is normal.          Assessment & Plan:    Lower extremity edema improved. We'll continue off furosemide at a dose of 80 mg every morning. He was asked to watch his salt intake and keep his legs  elevated as much as possible. Daily weights were recommended with an afternoon dose of Lasix if swelling worsens or there is any significant weight gain. Compression hose again discussed Diabetes mellitus. Samples of insulin dispensed Chronic pain. Oxycodone refilled  Followup PCP next month as scheduled

## 2013-04-30 NOTE — Progress Notes (Signed)
  Subjective:    Patient ID: [name], male    DOB: May 04, 1935, 78 y.o.   MRN: [id number]  HPI    Wt Readings from Last 3 Encounters:  04/30/13 182 lb (82.555 kg)  04/09/13 187 lb (84.823 kg)  03/21/13 182 lb (82.555 kg)    Review of Systems     Objective:   Physical Exam        Assessment & Plan:

## 2013-04-30 NOTE — Patient Instructions (Signed)
Limit your sodium (Salt) intake  Keep her legs elevated as much as possible  Take an additional furosemide in the afternoon if there is any significant weight gain or worsening of lower extremity edema  Return in 3 months for follow-up

## 2013-05-22 ENCOUNTER — Other Ambulatory Visit: Payer: Self-pay | Admitting: *Deleted

## 2013-05-22 MED ORDER — METFORMIN HCL 500 MG PO TABS: 500.0000 mg | ORAL_TABLET | Freq: Two times a day (BID) | ORAL | Status: DC

## 2013-05-25 ENCOUNTER — Other Ambulatory Visit: Payer: Self-pay | Admitting: Internal Medicine

## 2013-06-05 ENCOUNTER — Telehealth: Payer: Self-pay | Admitting: Internal Medicine

## 2013-06-05 MED ORDER — OXYCODONE HCL 10 MG PO TABS: 10.0000 mg | ORAL_TABLET | Freq: Four times a day (QID) | ORAL | Status: DC | PRN

## 2013-06-05 NOTE — Telephone Encounter (Signed)
Pt would like a refill of Oxycodone HCl 10 MG TABS  Dr Amador Cunas  filled this the last time, pt has his fup w/ Dr Lovell Sheehan on 7/28.  Pt states he takes 4/day.

## 2013-06-05 NOTE — Telephone Encounter (Signed)
Printed and will call pt to pick up when dr Lovell Sheehan signs

## 2013-06-14 ENCOUNTER — Other Ambulatory Visit: Payer: Self-pay | Admitting: Internal Medicine

## 2013-06-17 ENCOUNTER — Ambulatory Visit (INDEPENDENT_AMBULATORY_CARE_PROVIDER_SITE_OTHER): Payer: Medicare Other | Admitting: Internal Medicine

## 2013-06-17 ENCOUNTER — Encounter: Payer: Self-pay | Admitting: Internal Medicine

## 2013-06-17 VITALS — BP 144/80 | HR 72 | Temp 98.2°F | Resp 16 | Ht 70.5 in | Wt 177.0 lb

## 2013-06-17 DIAGNOSIS — I10 Essential (primary) hypertension: Secondary | ICD-10-CM

## 2013-06-17 DIAGNOSIS — I4891 Unspecified atrial fibrillation: Secondary | ICD-10-CM

## 2013-06-17 DIAGNOSIS — I8000 Phlebitis and thrombophlebitis of superficial vessels of unspecified lower extremity: Secondary | ICD-10-CM

## 2013-06-17 DIAGNOSIS — I872 Venous insufficiency (chronic) (peripheral): Secondary | ICD-10-CM

## 2013-06-17 DIAGNOSIS — I8003 Phlebitis and thrombophlebitis of superficial vessels of lower extremities, bilateral: Secondary | ICD-10-CM

## 2013-06-17 LAB — BASIC METABOLIC PANEL
CO2: 31 mEq/L (ref 19–32)
Calcium: 9 mg/dL (ref 8.4–10.5)
Chloride: 95 mEq/L — ABNORMAL LOW (ref 96–112)
Creatinine, Ser: 1.1 mg/dL (ref 0.4–1.5)
Sodium: 134 mEq/L — ABNORMAL LOW (ref 135–145)

## 2013-06-17 LAB — CBC WITH DIFFERENTIAL/PLATELET
Basophils Relative: 0.3 % (ref 0.0–3.0)
Eosinophils Relative: 1.1 % (ref 0.0–5.0)
Hemoglobin: 11.1 g/dL — ABNORMAL LOW (ref 13.0–17.0)
Lymphocytes Relative: 15.6 % (ref 12.0–46.0)
MCHC: 33.4 g/dL (ref 30.0–36.0)
Neutro Abs: 6.8 10*3/uL (ref 1.4–7.7)
Neutrophils Relative %: 76.7 % (ref 43.0–77.0)
RBC: 3.78 Mil/uL — ABNORMAL LOW (ref 4.22–5.81)
WBC: 8.8 10*3/uL (ref 4.5–10.5)

## 2013-06-17 MED ORDER — FUROSEMIDE 80 MG PO TABS: 80.0000 mg | ORAL_TABLET | Freq: Every day | ORAL | Status: DC

## 2013-06-17 NOTE — Progress Notes (Signed)
Subjective:    Patient ID: [name], male    DOB: 12/17/1934, 78 y.o.   MRN: [id number]  HPI One 80 of lasix in the AM and has noted presyncope and significant urination. Wife concern is for falls but also for the incontinence of travel and resturaunts HTn stable Poor DM control  Review of Systems  Constitutional: Negative for fever and fatigue.  HENT: Negative for hearing loss, congestion, neck pain and postnasal drip.   Eyes: Negative for discharge, redness and visual disturbance.  Respiratory: Negative for cough, shortness of breath and wheezing.   Cardiovascular: Negative for leg swelling.  Gastrointestinal: Negative for abdominal pain, constipation and abdominal distention.  Genitourinary: Negative for urgency and frequency.  Musculoskeletal: Negative for joint swelling and arthralgias.  Skin: Negative for color change and rash.  Neurological: Negative for weakness and light-headedness.  Hematological: Negative for adenopathy.  Psychiatric/Behavioral: Negative for behavioral problems.   Past Medical History  Diagnosis Date  . Fatigue   . Overweight(278.02)   . Syncope and collapse   . GERD (gastroesophageal reflux disease)   . Right bundle branch block   . Unspecified venous (peripheral) insufficiency   . Phlebitis and thrombophlebitis of superficial vessels of lower extremities   . Cellulitis and abscess of leg, except foot   . Backache, unspecified   . Long term (current) use of anticoagulants   . Type II or unspecified type diabetes mellitus without mention of complication, not stated as uncontrolled   . Unspecified venous (peripheral) insufficiency   . Hypertension   . Gout, unspecified   . Heart failure   . Atrial fibrillation     permanent  . Esophageal stricture   . Hx of colonic polyps 05-2006    (Adenomatous)Dr. Arlyce Dice  . Diverticulosis 05-2006    Dr. Arlyce Dice   . Tachycardia-bradycardia     s/p PPM  . Pacemaker- [name] 02/20/2013    History    Social History  . Marital Status: Married    Spouse Name: [name]    Number of Children: 2  . Years of Education: [name]   Occupational History  . Retired     Social History Main Topics  . Smoking status: Never Smoker   . Smokeless tobacco: Never Used  . Alcohol Use: Yes     Comment: two drinks daily   . Drug Use: No  . Sexually Active: Yes   Other Topics Concern  . Not on file   Social History Narrative   2 caffeine drinks daily     Past Surgical History  Procedure Laterality Date  . Laminectomy    . Lumbar fusion    . Rotator cuff repair    . Ruptured rt rectus muscle    . Eye surgery    . Pacemaker insertion  8/12    SJM by Dr Johney Frame for tachy/brady syndrome    Family History  Problem Relation Age of Onset  . Pneumonia Mother   . Heart disease Father   . Diabetes Father   . Colon cancer Neg Hx     Allergies  Allergen Reactions  . Morphine Rash    REACTION:irritability    Current Outpatient Prescriptions on File Prior to Visit  Medication Sig Dispense Refill  . apixaban (ELIQUIS) 5 MG TABS tablet Take 1 tablet (5 mg total) by mouth 2 (two) times daily.  60 tablet  11  . atorvastatin (LIPITOR) 10 MG tablet Take 1 tablet (10 mg total) by mouth every other day.  30 tablet  11  . B-D ULTRAFINE III SHORT PEN 31G X 8 MM MISC USE AS DIRECTED.  100 each  6  . DIGOX 0.125 MG tablet TAKE 1 TABLET ONCE DAILY.  60 tablet  1  . diltiazem (CARDIZEM CD) 240 MG 24 hr capsule Take 1 capsule (240 mg total) by mouth daily.  30 capsule  5  . FREESTYLE LITE test strip CHECK BLOOD SUGAR 1 TO 2 TIMES A DAY.  100 each  11  . furosemide (LASIX) 40 MG tablet 80 mg in the morning 40 mg early afternoon  60 tablet  6  . insulin aspart (NOVOLOG) 100 UNIT/ML injection Sliding scale before meals-- if cbg is > 300 take 10 units:if 200-300 use 5 units- if < than 200 no insulin  1 vial  5  . insulin glargine (LANTUS) 100 UNIT/ML injection Inject 25 Units into the skin at bedtime.       .  Lancets (FREESTYLE) lancets CHECK BLOOD SUGAR TWICE A DAY OR AS DIRECTED  100 each  PRN  . metFORMIN (GLUCOPHAGE) 500 MG tablet Take 1 tablet (500 mg total) by mouth 2 (two) times daily with a meal.  60 tablet  5  . methocarbamol (ROBAXIN) 500 MG tablet TAKE 1 TABLET TWICE DAILY.  30 tablet  3  . omeprazole (PRILOSEC) 20 MG capsule TAKE (1) CAPSULE TWICE DAILY.  60 capsule  3  . Oxycodone HCl 10 MG TABS Take 1 tablet (10 mg total) by mouth every 6 (six) hours as needed.  120 tablet  0  . potassium chloride SA (K-DUR,KLOR-CON) 20 MEQ tablet Take 1 tablet (20 mEq total) by mouth daily.  30 tablet  3  . UNABLE TO FIND Outpatient physical therapy for TIA.  1 Units  0   No current facility-administered medications on file prior to visit.    BP 144/80  Pulse 72  Temp(Src) 98.2 F (36.8 C)  Resp 16  Ht 5' 10.5" (1.791 m)  Wt 177 lb (80.287 kg)  BMI 25.03 kg/m2       Objective:   Physical Exam  Nursing note and vitals reviewed. Constitutional: He appears well-developed and well-nourished.  HENT:  Head: Normocephalic and atraumatic.  Eyes: Conjunctivae are normal. Pupils are equal, round, and reactive to light.  Neck: Normal range of motion. Neck supple.  Cardiovascular: Normal rate and regular rhythm.   Pulmonary/Chest: Effort normal and breath sounds normal.  Abdominal: Soft. Bowel sounds are normal.          Assessment & Plan:  DM control poor Redoubling diet and stop sweets stop alcohol Reviewed pain reviewed edema and need to use  Hose Memory drugs? Memory loss?

## 2013-06-17 NOTE — Patient Instructions (Signed)
Thiamin 100mg   Twice a day ginsing

## 2013-06-25 ENCOUNTER — Other Ambulatory Visit: Payer: Self-pay | Admitting: Dermatology

## 2013-07-01 ENCOUNTER — Telehealth: Payer: Self-pay | Admitting: Internal Medicine

## 2013-07-01 MED ORDER — OXYCODONE HCL 10 MG PO TABS
10.0000 mg | ORAL_TABLET | Freq: Four times a day (QID) | ORAL | Status: DC | PRN
Start: 2013-07-01 — End: 2013-08-05

## 2013-07-01 NOTE — Telephone Encounter (Signed)
Printed and will call pt to pick up whne signed

## 2013-07-01 NOTE — Telephone Encounter (Signed)
PT is calling to request a refill of his Oxycodone HCl 10 MG TABS. Please assist.

## 2013-07-03 ENCOUNTER — Other Ambulatory Visit: Payer: Self-pay | Admitting: Internal Medicine

## 2013-07-12 ENCOUNTER — Telehealth: Payer: Self-pay | Admitting: Internal Medicine

## 2013-07-12 NOTE — Telephone Encounter (Signed)
Per dr Perry Mount hold for 3 days prior-pt informed

## 2013-07-12 NOTE — Telephone Encounter (Signed)
Pt is calling to ask a medication question.  Pt states that he is having skin surgery on 07/30/13.  Pt wants to know if he needs to hold his Eliquis that he takes BID.  Office please follow up with the patient regarding medication

## 2013-07-17 ENCOUNTER — Telehealth: Payer: Self-pay | Admitting: Internal Medicine

## 2013-07-17 NOTE — Telephone Encounter (Signed)
No samples available and patient is aware

## 2013-07-17 NOTE — Telephone Encounter (Signed)
Pt is out of insulin glargine (LANTUS) 100 UNIT/ML injection And would like to know if we have samples. Pt has no money for refill.

## 2013-07-30 ENCOUNTER — Other Ambulatory Visit: Payer: Self-pay | Admitting: Internal Medicine

## 2013-08-05 ENCOUNTER — Telehealth: Payer: Self-pay | Admitting: Internal Medicine

## 2013-08-05 MED ORDER — OXYCODONE HCL 10 MG PO TABS: 10.0000 mg | ORAL_TABLET | Freq: Four times a day (QID) | ORAL | Status: DC | PRN

## 2013-08-05 NOTE — Telephone Encounter (Signed)
Pt needs new rx oxycodone °

## 2013-08-05 NOTE — Telephone Encounter (Signed)
Printed and will call pt to pick up after dr Lovell Sheehan signs on wednesday

## 2013-08-26 ENCOUNTER — Telehealth: Payer: Self-pay | Admitting: Internal Medicine

## 2013-08-26 ENCOUNTER — Ambulatory Visit (INDEPENDENT_AMBULATORY_CARE_PROVIDER_SITE_OTHER): Payer: Medicare Other | Admitting: Family Medicine

## 2013-08-26 ENCOUNTER — Encounter: Payer: Self-pay | Admitting: Family Medicine

## 2013-08-26 VITALS — BP 142/72 | HR 96 | Temp 98.7°F | Wt 182.0 lb

## 2013-08-26 DIAGNOSIS — S91301A Unspecified open wound, right foot, initial encounter: Secondary | ICD-10-CM

## 2013-08-26 DIAGNOSIS — S81009A Unspecified open wound, unspecified knee, initial encounter: Secondary | ICD-10-CM

## 2013-08-26 DIAGNOSIS — S91309A Unspecified open wound, unspecified foot, initial encounter: Secondary | ICD-10-CM

## 2013-08-26 DIAGNOSIS — S81801A Unspecified open wound, right lower leg, initial encounter: Secondary | ICD-10-CM

## 2013-08-26 MED ORDER — CEPHALEXIN 500 MG PO CAPS: 500.0000 mg | ORAL_CAPSULE | Freq: Three times a day (TID) | ORAL | Status: DC

## 2013-08-26 NOTE — Patient Instructions (Signed)
Cellulitis Cellulitis is an infection of the skin and the tissue beneath it. The infected area is usually red and tender. Cellulitis occurs most often in the arms and lower legs.  CAUSES  Cellulitis is caused by bacteria that enter the skin through cracks or cuts in the skin. The most common types of bacteria that cause cellulitis are Staphylococcus and Streptococcus. SYMPTOMS   Redness and warmth.  Swelling.  Tenderness or pain.  Fever. DIAGNOSIS  Your caregiver can usually determine what is wrong based on a physical exam. Blood tests may also be done. TREATMENT  Treatment usually involves taking an antibiotic medicine. HOME CARE INSTRUCTIONS   Take your antibiotics as directed. Finish them even if you start to feel better.  Keep the infected arm or leg elevated to reduce swelling.  Apply a warm cloth to the affected area up to 4 times per day to relieve pain.  Only take over-the-counter or prescription medicines for pain, discomfort, or fever as directed by your caregiver.  Keep all follow-up appointments as directed by your caregiver. SEEK MEDICAL CARE IF:   You notice red streaks coming from the infected area.  Your red area gets larger or turns dark in color.  Your bone or joint underneath the infected area becomes painful after the skin has healed.  Your infection returns in the same area or another area.  You notice a swollen bump in the infected area.  You develop new symptoms. SEEK IMMEDIATE MEDICAL CARE IF:   You have a fever.  You feel very sleepy.  You develop vomiting or diarrhea.  You have a general ill feeling (malaise) with muscle aches and pains. MAKE SURE YOU:   Understand these instructions.  Will watch your condition.  Will get help right away if you are not doing well or get worse. Document Released: 08/17/2005 Document Revised: 05/08/2012 Document Reviewed: 01/23/2012 [employer] Patient Information 2014 [employer].  

## 2013-08-26 NOTE — Progress Notes (Signed)
  Subjective:    Patient ID: [name], male    DOB: 12-Jul-1935, 78 y.o.   MRN: [id number]  HPI Patient seen with "sores" right anterior leg and right lateral foot He has type 2 diabetes and does have history of peripheral neuropathy. He recently noticed some weeping of the skin from his leg and superficial ulceration right lateral heel. Denies any injury. He had some type of occlusive bandage over his right leg and noticed some open areas following that. He has some mild tenderness. No fevers or chills.  Diabetes has been reasonably well controlled. Patient's been using some topical Vaseline right lateral foot.  Past Medical History  Diagnosis Date  . Fatigue   . Overweight(278.02)   . Syncope and collapse   . GERD (gastroesophageal reflux disease)   . Right bundle branch block   . Unspecified venous (peripheral) insufficiency   . Phlebitis and thrombophlebitis of superficial vessels of lower extremities   . Cellulitis and abscess of leg, except foot   . Backache, unspecified   . Long term (current) use of anticoagulants   . Type II or unspecified type diabetes mellitus without mention of complication, not stated as uncontrolled   . Unspecified venous (peripheral) insufficiency   . Hypertension   . Gout, unspecified   . Heart failure   . Atrial fibrillation     permanent  . Esophageal stricture   . Hx of colonic polyps 05-2006    (Adenomatous)Dr. Arlyce Dice  . Diverticulosis 05-2006    Dr. Arlyce Dice   . Tachycardia-bradycardia     s/p PPM  . Pacemaker- [name] 02/20/2013   Past Surgical History  Procedure Laterality Date  . Laminectomy    . Lumbar fusion    . Rotator cuff repair    . Ruptured rt rectus muscle    . Eye surgery    . Pacemaker insertion  8/12    SJM by Dr Johney Frame for tachy/brady syndrome    reports that he has never smoked. He has never used smokeless tobacco. He reports that  drinks alcohol. He reports that he does not use illicit drugs. family history  includes Diabetes in his father; Heart disease in his father; Pneumonia in his mother. There is no history of Colon cancer. Allergies  Allergen Reactions  . Morphine Rash    REACTION:irritability      Review of Systems  Constitutional: Negative for fever and chills.       Objective:   Physical Exam  Constitutional: He appears well-developed and well-nourished.  Cardiovascular: Normal rate.   Pulmonary/Chest: Effort normal and breath sounds normal. No respiratory distress. He has no wheezes. He has no rales.  Skin:  Right lateral foot and heel reveals 12 x 15 mm superficial ulcer. No drainage. No surrounding erythema.  right anterior leg reveals 5 separate superficial open areas somewhat in shape of a rectangle -?from recent bandage. He has some surrounding erythema and mild tenderness to palpation.          Assessment & Plan:  Open wounds involving right lateral heel and right anterior leg in Type 2 diabetic with hx of neuropathy. Wounds are all very superficial.  Possible early cellulitis changes involving the leg. Keflex 500 mg 3 times a day for 10 days. Elevation. Topical wound care discussed. Reassess one week

## 2013-08-26 NOTE — Telephone Encounter (Signed)
Patient Information:  Caller Name: Tab  Phone: [phone]  Patient: [name] A  Gender: Male  DOB: 1934-12-18  Age: 78 Years  PCP: [name]  Office Follow Up:  Does the office need to follow up with this patient?: No  Instructions For The Office: [name]  RN Note:  Sore w/ drainage, pain and swelling on side of Right Foot. x1 week.  Wound 1/2 inch in diameter w/ yellow discharge,  Pt has Hx DM. Pt denies injury.   Symptoms  Reason For Call & Symptoms: Sore w/ drainage, pain and swelling on side of Right Foot.  Wound 1/2 inch in diameter w/ yellow discharge,  Pt has Hx DM.  Reviewed Health History In EMR: Yes  Reviewed Medications In EMR: Yes  Reviewed Allergies In EMR: Yes  Reviewed Surgeries / Procedures: Yes  Date of Onset of Symptoms: 08/19/2013  Guideline(s) Used:  Wound Infection  Disposition Per Guideline:   See Today or Tomorrow in Office  Reason For Disposition Reached:   Wound becomes more painful or swollen, 3 or more days after injury  Advice Given:  [name]  Patient Will Follow Care Advice:  YES  Appointment Scheduled:  08/26/2013 14:45:00 Appointment Scheduled Provider:  Evelena Peat (Family Practice)

## 2013-08-29 ENCOUNTER — Other Ambulatory Visit: Payer: Self-pay | Admitting: Internal Medicine

## 2013-09-02 ENCOUNTER — Encounter: Payer: Self-pay | Admitting: Family Medicine

## 2013-09-02 ENCOUNTER — Ambulatory Visit (INDEPENDENT_AMBULATORY_CARE_PROVIDER_SITE_OTHER): Payer: Medicare Other | Admitting: Family Medicine

## 2013-09-02 VITALS — BP 140/70 | HR 76 | Temp 98.0°F | Wt 185.0 lb

## 2013-09-02 DIAGNOSIS — Z5189 Encounter for other specified aftercare: Secondary | ICD-10-CM

## 2013-09-02 DIAGNOSIS — Z23 Encounter for immunization: Secondary | ICD-10-CM

## 2013-09-02 DIAGNOSIS — S91301D Unspecified open wound, right foot, subsequent encounter: Secondary | ICD-10-CM

## 2013-09-02 NOTE — Progress Notes (Signed)
  Subjective:    Patient ID: [name], male    DOB: December 06, 1934, 78 y.o.   MRN: [id number]  HPI Followup regarding right leg wounds with early cellulitis and right lateral foot wound. We placed him on Keflex anything less redness. No fevers or chills. 5 superficial wounds on the right anterior leg have fully healed. Ambulating without difficulty. He is using some Vaseline right lateral foot to avoid frictional this region.  Requesting samples of Lantus. He is currently in the doughnut hole. Requesting flu vaccine.   Past Medical History  Diagnosis Date  . Fatigue   . Overweight(278.02)   . Syncope and collapse   . GERD (gastroesophageal reflux disease)   . Right bundle branch block   . Unspecified venous (peripheral) insufficiency   . Phlebitis and thrombophlebitis of superficial vessels of lower extremities   . Cellulitis and abscess of leg, except foot   . Backache, unspecified   . Long term (current) use of anticoagulants   . Type II or unspecified type diabetes mellitus without mention of complication, not stated as uncontrolled   . Unspecified venous (peripheral) insufficiency   . Hypertension   . Gout, unspecified   . Heart failure   . Atrial fibrillation     permanent  . Esophageal stricture   . Hx of colonic polyps 05-2006    (Adenomatous)Dr. Arlyce Dice  . Diverticulosis 05-2006    Dr. Arlyce Dice   . Tachycardia-bradycardia     s/p PPM  . Pacemaker- [name] 02/20/2013   Past Surgical History  Procedure Laterality Date  . Laminectomy    . Lumbar fusion    . Rotator cuff repair    . Ruptured rt rectus muscle    . Eye surgery    . Pacemaker insertion  8/12    SJM by Dr Johney Frame for tachy/brady syndrome    reports that he has never smoked. He has never used smokeless tobacco. He reports that he drinks alcohol. He reports that he does not use illicit drugs. family history includes Diabetes in his father; Heart disease in his father; Pneumonia in his mother. There is  no history of Colon cancer. Allergies  Allergen Reactions  . Morphine Rash    REACTION:irritability      Review of Systems  Constitutional: Negative for fever and chills.       Objective:   Physical Exam  Constitutional: He appears well-developed and well-nourished.  Cardiovascular: Normal rate.   Pulmonary/Chest: Effort normal and breath sounds normal. No respiratory distress. He has no wheezes. He has no rales.  Skin:  Right anterior leg reveals that previous wounds have fully healed. He has some diffuse scaling. Minimal erythema Right lateral foot one is about 12 x 14 mm and very superficial and no signs of secondary infection          Assessment & Plan:  Wounds involving right leg have healed. Right lateral foot wound appears more superficial and healing without signs of secondary infection. Continue good local wound care. Influenza vaccine given. Followup with his primary in 2 weeks if right foot wound is not fully healing

## 2013-09-02 NOTE — Addendum Note (Signed)
Addended by: Shelby Dubin E on: 09/02/2013 01:52 PM   Modules accepted: Orders

## 2013-09-02 NOTE — Patient Instructions (Signed)
Follow up with Dr Lovell Sheehan in 2 weeks if right foot ulcer not healing Continue with daily cleaning with soap and water and continue with vaseline to avoid friction to area.

## 2013-09-10 ENCOUNTER — Other Ambulatory Visit: Payer: Self-pay | Admitting: *Deleted

## 2013-09-10 MED ORDER — OXYCODONE HCL 10 MG PO TABS: 10.0000 mg | ORAL_TABLET | Freq: Four times a day (QID) | ORAL | Status: DC | PRN

## 2013-09-12 ENCOUNTER — Telehealth: Payer: Self-pay | Admitting: Internal Medicine

## 2013-09-12 NOTE — Telephone Encounter (Signed)
Pt would like samples of lantus. Pt in donut hole

## 2013-09-13 NOTE — Telephone Encounter (Signed)
Given

## 2013-09-25 ENCOUNTER — Telehealth: Payer: Self-pay | Admitting: Internal Medicine

## 2013-09-25 NOTE — Telephone Encounter (Signed)
New problem     Pt has been getting dizziness since taking DILTIAZEM 24 hr cd 240 for the pass 6 months.  Pt would like to know if there is an alternate med he can take, it is also time for him to refill.

## 2013-09-25 NOTE — Telephone Encounter (Signed)
lmtcb

## 2013-09-26 NOTE — Telephone Encounter (Signed)
Patient is returning your call. Please call  °

## 2013-09-26 NOTE — Telephone Encounter (Signed)
Left message with patient's wife to call back.

## 2013-09-27 NOTE — Telephone Encounter (Signed)
lmtcb

## 2013-09-30 ENCOUNTER — Encounter: Payer: Self-pay | Admitting: Internal Medicine

## 2013-09-30 ENCOUNTER — Ambulatory Visit (INDEPENDENT_AMBULATORY_CARE_PROVIDER_SITE_OTHER): Payer: Medicare Other | Admitting: Internal Medicine

## 2013-09-30 ENCOUNTER — Other Ambulatory Visit: Payer: Self-pay | Admitting: *Deleted

## 2013-09-30 VITALS — BP 160/82 | HR 88 | Temp 98.2°F | Resp 16 | Ht 70.5 in | Wt 187.0 lb

## 2013-09-30 DIAGNOSIS — IMO0002 Reserved for concepts with insufficient information to code with codable children: Secondary | ICD-10-CM

## 2013-09-30 DIAGNOSIS — I83013 Varicose veins of right lower extremity with ulcer of ankle: Secondary | ICD-10-CM

## 2013-09-30 DIAGNOSIS — Z91199 Patient's noncompliance with other medical treatment and regimen due to unspecified reason: Secondary | ICD-10-CM

## 2013-09-30 DIAGNOSIS — L97309 Non-pressure chronic ulcer of unspecified ankle with unspecified severity: Secondary | ICD-10-CM

## 2013-09-30 DIAGNOSIS — H5460 Unqualified visual loss, one eye, unspecified: Secondary | ICD-10-CM

## 2013-09-30 DIAGNOSIS — Z9119 Patient's noncompliance with other medical treatment and regimen: Secondary | ICD-10-CM

## 2013-09-30 DIAGNOSIS — H5462 Unqualified visual loss, left eye, normal vision right eye: Secondary | ICD-10-CM

## 2013-09-30 DIAGNOSIS — E1165 Type 2 diabetes mellitus with hyperglycemia: Secondary | ICD-10-CM

## 2013-09-30 DIAGNOSIS — I872 Venous insufficiency (chronic) (peripheral): Secondary | ICD-10-CM

## 2013-09-30 LAB — COMPREHENSIVE METABOLIC PANEL
Alkaline Phosphatase: 79 U/L (ref 39–117)
BUN: 12 mg/dL (ref 6–23)
CO2: 31 mEq/L (ref 19–32)
Creatinine, Ser: 1.2 mg/dL (ref 0.4–1.5)
GFR: 63.74 mL/min (ref >60.00)
Glucose, Bld: 140 mg/dL — ABNORMAL HIGH (ref 70–99)
Sodium: 133 mEq/L — ABNORMAL LOW (ref 135–145)
Total Bilirubin: 0.8 mg/dL (ref 0.3–1.2)
Total Protein: 7.1 g/dL (ref 6.0–8.3)

## 2013-09-30 MED ORDER — DILTIAZEM HCL ER COATED BEADS 240 MG PO CP24: 240.0000 mg | ORAL_CAPSULE | Freq: Every day | ORAL | Status: DC

## 2013-09-30 NOTE — Telephone Encounter (Signed)
Follow UP:  Pt is returning Sherri's call.

## 2013-09-30 NOTE — Progress Notes (Signed)
Pre-visit discussion using our clinic review tool. No additional management support is needed unless otherwise documented below in the visit note.  

## 2013-09-30 NOTE — Progress Notes (Signed)
  Subjective:    Patient ID: [name], male    DOB: Aug 25, 1935, 78 y.o.   MRN: [id number]  HPI Not adherent to diet. Still drinks to much and eats two much sugar Elevated blood pressure but did not take medications this AM Increased sores in legs due to venous stasis and needs to  Blurred vision in left eye with risk of macular degenration   Review of Systems  Constitutional: Negative for fever and fatigue.  HENT: Negative for congestion, hearing loss and postnasal drip.   Eyes: Negative for discharge, redness and visual disturbance.  Respiratory: Negative for cough, shortness of breath and wheezing.   Cardiovascular: Negative for leg swelling.  Gastrointestinal: Negative for abdominal pain, constipation and abdominal distention.  Genitourinary: Negative for urgency and frequency.  Musculoskeletal: Negative for arthralgias, joint swelling and neck pain.  Skin: Negative for color change and rash.       bbasis dermatitis venous stasis ulceration and significant ulceration on right heel but could be a diabetic ulcer versus a stasis ulcer  Neurological: Negative for weakness and light-headedness.  Hematological: Negative for adenopathy.  Psychiatric/Behavioral: Negative for behavioral problems.       Objective:   Physical Exam  Constitutional: He appears well-developed and well-nourished.  HENT:  Head: Normocephalic and atraumatic.  Eyes: Conjunctivae are normal. Pupils are equal, round, and reactive to light.  Neck: Normal range of motion. Neck supple.  Cardiovascular: Normal rate and regular rhythm.   Murmur heard. Pulmonary/Chest: Effort normal and breath sounds normal.  Abdominal: Soft. Bowel sounds are normal.  Skin:  Stasis ulceration on right heel and stasis dermatitison both legs          Assessment & Plan:  Benefit application today and have home health care applied and no bruits twice weekly for 3 weeks Followup with Jennefer Bravo in one month to make  sure that stasis ulcers are healed then resume wearing compression hose Hemoglobin A1c today basic metabolic panel and liver panel today  Bilateral Unna boots were applied to the patient from his mid foot to hisupper calf. And coban 5 inch dressing was placed over the Unna boot Bilateral application today

## 2013-09-30 NOTE — Telephone Encounter (Signed)
After discussing with Dr. Graciela Husbands. explained to patient that he has been on this medication for a long time, several years, and that this would not be the cause of new onset of dizziness. Patient tells me there has been no new medication changes. He will follow up with his PCP about dizziness problems.

## 2013-09-30 NOTE — Patient Instructions (Signed)
We will place an Unna boots today and health to come out to change the Unna boot twice a week for the next 3 weeks

## 2013-10-01 ENCOUNTER — Other Ambulatory Visit: Payer: Self-pay | Admitting: *Deleted

## 2013-10-01 MED ORDER — DILTIAZEM HCL ER COATED BEADS 180 MG PO CP24: 180.0000 mg | ORAL_CAPSULE | Freq: Every day | ORAL | Status: DC

## 2013-10-14 ENCOUNTER — Telehealth: Payer: Self-pay | Admitting: Internal Medicine

## 2013-10-14 ENCOUNTER — Other Ambulatory Visit: Payer: Self-pay | Admitting: *Deleted

## 2013-10-14 MED ORDER — OXYCODONE HCL 10 MG PO TABS: 10.0000 mg | ORAL_TABLET | Freq: Four times a day (QID) | ORAL | Status: DC | PRN

## 2013-10-14 NOTE — Telephone Encounter (Signed)
Printed and will call pt to pick up asfter dr Lovell Sheehan signs

## 2013-10-14 NOTE — Telephone Encounter (Signed)
Pt needs new rx oxycodone °

## 2013-10-21 ENCOUNTER — Telehealth: Payer: Self-pay | Admitting: Internal Medicine

## 2013-10-21 MED ORDER — TRIAMCINOLONE ACETONIDE 0.025 % EX OINT: 1.0000 "application " | TOPICAL_OINTMENT | Freq: Two times a day (BID) | CUTANEOUS | Status: DC

## 2013-10-21 NOTE — Telephone Encounter (Signed)
May apply steroid cream requested

## 2013-10-21 NOTE — Telephone Encounter (Signed)
Rx sent to pharmacy and Arline Asp is aware

## 2013-10-21 NOTE — Telephone Encounter (Signed)
Arline Asp, nurse with [facility] has noticed a rash on pt's right calf area while applying uniwraps. Nurse would like to know how Dr. Lovell Sheehan would feel if she applied triamcinolone ointment NOT cream to the rash, nurse states that she has known it to work well with clearing rashes.

## 2013-10-23 ENCOUNTER — Encounter: Payer: Self-pay | Admitting: Internal Medicine

## 2013-10-30 ENCOUNTER — Encounter: Payer: Self-pay | Admitting: Family

## 2013-10-30 ENCOUNTER — Ambulatory Visit (INDEPENDENT_AMBULATORY_CARE_PROVIDER_SITE_OTHER): Payer: Medicare Other | Admitting: Family

## 2013-10-30 VITALS — BP 140/70 | HR 98 | Temp 98.6°F | Wt 180.0 lb

## 2013-10-30 DIAGNOSIS — L0390 Cellulitis, unspecified: Secondary | ICD-10-CM

## 2013-10-30 DIAGNOSIS — L0291 Cutaneous abscess, unspecified: Secondary | ICD-10-CM

## 2013-10-30 DIAGNOSIS — L97509 Non-pressure chronic ulcer of other part of unspecified foot with unspecified severity: Secondary | ICD-10-CM

## 2013-10-30 DIAGNOSIS — E119 Type 2 diabetes mellitus without complications: Secondary | ICD-10-CM

## 2013-10-30 DIAGNOSIS — L97519 Non-pressure chronic ulcer of other part of right foot with unspecified severity: Secondary | ICD-10-CM

## 2013-10-30 NOTE — Progress Notes (Signed)
Pre visit review using our clinic review tool, if applicable. No additional management support is needed unless otherwise documented below in the visit note. 

## 2013-10-30 NOTE — Patient Instructions (Signed)
Skin Ulcer  A skin ulcer is an open sore that can be shallow or deep. Skin ulcers sometimes become infected and are difficult to treat. It may be 1 month or longer before real healing progress is made.  CAUSES    Injury.   Problems with the veins or arteries.   Diabetes.   Insect bites.   Bedsores.   Inflammatory conditions.  SYMPTOMS    Pain, redness, swelling, and tenderness around the ulcer.   Fever.   Bleeding from the ulcer.   Yellow or clear fluid coming from the ulcer.  DIAGNOSIS   There are many types of skin ulcers. Any open sores will be examined. Certain tests will be done to determine the kind of ulcer you have. The right treatment depends on the type of ulcer you have.  TREATMENT   Treatment is a long-term challenge. It may include:   Wearing an elastic wrap, compression stockings, or gel cast over the ulcer area.   Taking antibiotic medicines or putting antibiotic creams on the affected area if there is an infection.  HOME CARE INSTRUCTIONS   Put on your bandages (dressings), wraps, or casts over the ulcer as directed by your caregiver.   Change all dressings as directed by your caregiver.   Take all medicines as directed by your caregiver.   Keep the affected area clean and dry.   Avoid injuries to the affected area.   Eat a well-balanced, healthy diet that includes plenty of fruit and vegetables.   If you smoke, consider quitting or decreasing the amount of cigarettes you smoke.   Once the ulcer heals, get regular exercise as directed by your caregiver.   Work with your caregiver to make sure your blood pressure, cholesterol, and diabetes are well-controlled.   Keep your skin moisturized. Dry skin can crack and lead to skin ulcers.  SEEK IMMEDIATE MEDICAL CARE IF:    Your pain gets worse.   You have swelling, redness, or fluids around the ulcer.   You have chills.   You have a fever.  MAKE SURE YOU:    Understand these instructions.   Will watch your condition.   Will get  help right away if you are not doing well or get worse.  Document Released: 12/15/2004 Document Revised: 01/30/2012 Document Reviewed: 06/24/2011  [employer] Patient Information 2014 [employer]

## 2013-10-30 NOTE — Progress Notes (Signed)
Subjective:    Patient ID: [name], male    DOB: 03-Feb-1935, 78 y.o.   MRN: [id number]  HPI  78 year old caucasian male, nonsmoker, presenting for follow-up of cellulitis and ulcer of the right heel. He was seen by Dr. Lovell Sheehan last month who has prescribed home health care and wraps that he is currently getting done twice weekly. He feels like he is progressing well. Denies pain. The ulceration to his right heel appears to be healing and the vesicles are nearly resolved.  He is scheduled to see a foot specialist on 11/07/2013. Denies any other concerns today.   Review of Systems  Constitutional: Negative.   HENT: Negative.   Eyes: Negative.   Respiratory: Negative.   Cardiovascular: Negative.   Gastrointestinal: Negative.   Endocrine: Negative.   Genitourinary: Negative.   Musculoskeletal: Negative.   Skin: Positive for wound.       Patient states, " My legs are getting better. Ulcer right foot. Cellulitis improving  Allergic/Immunologic: Negative.   Neurological: Negative.   Hematological: Negative.   Psychiatric/Behavioral: Negative.    Past Medical History  Diagnosis Date  . Fatigue   . Overweight(278.02)   . Syncope and collapse   . GERD (gastroesophageal reflux disease)   . Right bundle branch block   . Unspecified venous (peripheral) insufficiency   . Phlebitis and thrombophlebitis of superficial vessels of lower extremities   . Cellulitis and abscess of leg, except foot   . Backache, unspecified   . Long term (current) use of anticoagulants   . Type II or unspecified type diabetes mellitus without mention of complication, not stated as uncontrolled   . Unspecified venous (peripheral) insufficiency   . Hypertension   . Gout, unspecified   . Heart failure   . Atrial fibrillation     permanent  . Esophageal stricture   . Hx of colonic polyps 05-2006    (Adenomatous)Dr. Arlyce Dice  . Diverticulosis 05-2006    Dr. Arlyce Dice   . Tachycardia-bradycardia     s/p  PPM  . Pacemaker- [name] 02/20/2013    History   Social History  . Marital Status: Married    Spouse Name: [name]    Number of Children: 2  . Years of Education: [name]   Occupational History  . Retired     Social History Main Topics  . Smoking status: Never Smoker   . Smokeless tobacco: Never Used  . Alcohol Use: Yes     Comment: two drinks daily   . Drug Use: No  . Sexual Activity: Yes   Other Topics Concern  . Not on file   Social History Narrative   2 caffeine drinks daily     Past Surgical History  Procedure Laterality Date  . Laminectomy    . Lumbar fusion    . Rotator cuff repair    . Ruptured rt rectus muscle    . Eye surgery    . Pacemaker insertion  8/12    SJM by Dr Johney Frame for tachy/brady syndrome    Family History  Problem Relation Age of Onset  . Pneumonia Mother   . Heart disease Father   . Diabetes Father   . Colon cancer Neg Hx     Allergies  Allergen Reactions  . Morphine Rash    REACTION:irritability    Current Outpatient Prescriptions on File Prior to Visit  Medication Sig Dispense Refill  . apixaban (ELIQUIS) 5 MG TABS tablet Take 1 tablet (5 mg  total) by mouth 2 (two) times daily.  60 tablet  11  . B-D ULTRAFINE III SHORT PEN 31G X 8 MM MISC USE AS DIRECTED.  100 each  6  . DIGOX 125 MCG tablet TAKE 1 TABLET ONCE DAILY.  60 tablet  0  . diltiazem (CARDIZEM CD) 180 MG 24 hr capsule Take 1 capsule (180 mg total) by mouth daily.  30 capsule  6  . FREESTYLE LITE test strip CHECK BLOOD SUGAR 1 TO 2 TIMES A DAY.  100 each  11  . furosemide (LASIX) 80 MG tablet Take 1 tablet (80 mg total) by mouth daily.  30 tablet  3  . insulin aspart (NOVOLOG) 100 UNIT/ML injection Sliding scale before meals-- if cbg is > 300 take 10 units:if 200-300 use 5 units- if < than 200 no insulin  1 vial  5  . insulin glargine (LANTUS) 100 UNIT/ML injection Inject 25 Units into the skin at bedtime.       . Lancets (FREESTYLE) lancets CHECK BLOOD SUGAR TWICE A DAY OR  AS DIRECTED  100 each  PRN  . metFORMIN (GLUCOPHAGE) 500 MG tablet Take 1 tablet (500 mg total) by mouth 2 (two) times daily with a meal.  60 tablet  5  . methocarbamol (ROBAXIN) 500 MG tablet TAKE 1 TABLET TWICE DAILY.  30 tablet  3  . omeprazole (PRILOSEC) 20 MG capsule TAKE (1) CAPSULE TWICE DAILY.  60 capsule  6  . Oxycodone HCl 10 MG TABS Take 1 tablet (10 mg total) by mouth every 6 (six) hours as needed.  120 tablet  0  . triamcinolone (KENALOG) 0.025 % ointment Apply 1 application topically 2 (two) times daily.  30 g  2  . UNABLE TO FIND Outpatient physical therapy for TIA.  1 Units  0  . potassium chloride SA (K-DUR,KLOR-CON) 20 MEQ tablet Take 1 tablet (20 mEq total) by mouth daily.  30 tablet  3   No current facility-administered medications on file prior to visit.    BP 140/70  Pulse 98  Temp(Src) 98.6 F (37 C) (Oral)  Wt 180 lb (81.647 kg)  SpO2 98%chart     Objective:   Physical Exam  Constitutional: He is oriented to person, place, and time. He appears well-developed and well-nourished.  Neck: Normal range of motion.  Cardiovascular: Normal rate, regular rhythm and normal heart sounds.   Pulmonary/Chest: Effort normal and breath sounds normal.  Neurological: He is alert and oriented to person, place, and time.  Skin: Skin is warm and dry.  Unna boots to both legs. Unna boot removed bilaterally. Right leg has small heal superficial ulceration  on  Right shin.  siz 0.5 cm healing ulcer noted to the right heel.  Serosanguineous fluid noted. No pustular drainage. No signs of infection. Left leg: Healing superficial ulcerations noted to the anterior aspect of the left lower leg. No signs or symptoms of infection. No redness or pustular discharge. Normal skin temperature.  Psychiatric: He has a normal mood and affect.   Unna boot replaced on right side from shin to ankle.  Left leg open to air.       Assessment & Plan:  Assessment 1. Ulcer right heel 2. Healing  cellulitis 3. Healing vesicles   Plan Continue with [facility]. Continue current medications.  Call office with any concerns or questions. Followup with foot specialist as scheduled.

## 2013-11-04 ENCOUNTER — Other Ambulatory Visit: Payer: Self-pay

## 2013-11-04 MED ORDER — DIGOXIN 125 MCG PO TABS: ORAL_TABLET | ORAL | Status: DC

## 2013-11-05 ENCOUNTER — Encounter: Payer: Self-pay | Admitting: Gastroenterology

## 2013-11-07 ENCOUNTER — Ambulatory Visit (INDEPENDENT_AMBULATORY_CARE_PROVIDER_SITE_OTHER): Payer: Medicare Other | Admitting: Podiatry

## 2013-11-07 ENCOUNTER — Encounter: Payer: Self-pay | Admitting: Podiatry

## 2013-11-07 VITALS — BP 181/87 | HR 82 | Resp 19 | Ht 70.0 in | Wt 170.0 lb

## 2013-11-07 DIAGNOSIS — M79609 Pain in unspecified limb: Secondary | ICD-10-CM

## 2013-11-07 DIAGNOSIS — L97409 Non-pressure chronic ulcer of unspecified heel and midfoot with unspecified severity: Secondary | ICD-10-CM

## 2013-11-07 DIAGNOSIS — L97411 Non-pressure chronic ulcer of right heel and midfoot limited to breakdown of skin: Secondary | ICD-10-CM

## 2013-11-07 DIAGNOSIS — B351 Tinea unguium: Secondary | ICD-10-CM

## 2013-11-07 NOTE — Progress Notes (Signed)
Subjective:     Patient ID: [name], male   DOB: 04/08/1935, 78 y.o.   MRN: [id number]  HPI patient presents with nail disease 1-5 both feet and a superficial ulceration right lateral posterior heel been treated with Unna boots   Review of Systems     Objective:   Physical Exam Neurovascular status unchanged with mild edema in the right foot secondary to venous disease and breakdown tissue right posterior heel along with thick nailbeds 1-5 both feet    Assessment:     Explained continued usage of Unna boots and elevation as appropriate but healing appears to be occurring right lesser and discussed thick toenails both feet    Plan:     Debridement nailbeds and continued treatment for ulcer as previous

## 2013-11-07 NOTE — Progress Notes (Signed)
   Subjective:    Patient ID: [name], male    DOB: Jun 05, 1935, 78 y.o.   MRN: [id number]  HPI N heel sore         L Right lateral heel        C hard skin        A diabetes, enclosed shoes        T Dr Lovell Sheehan prescribed unna boot    Review of Systems     Objective:   Physical Exam        Assessment & Plan:

## 2013-11-07 NOTE — Progress Notes (Signed)
Pt presents for debridement of B/L 1 - 5 toenails. 

## 2013-11-08 ENCOUNTER — Other Ambulatory Visit: Payer: Self-pay | Admitting: Internal Medicine

## 2013-11-08 ENCOUNTER — Encounter: Payer: Self-pay | Admitting: Internal Medicine

## 2013-11-11 ENCOUNTER — Telehealth: Payer: Self-pay | Admitting: Internal Medicine

## 2013-11-11 ENCOUNTER — Other Ambulatory Visit: Payer: Self-pay | Admitting: *Deleted

## 2013-11-11 NOTE — Telephone Encounter (Signed)
[facility] reports patients states preferred brand for testing supplies will now be Accucheck, They are requesting a new script for test strips, lancets and meter.

## 2013-11-11 NOTE — Telephone Encounter (Signed)
Wrote out script for accu check and faxed to [facility]- I then called [facility] and asked which accu check it was and they didn't know-

## 2013-11-18 ENCOUNTER — Telehealth: Payer: Self-pay | Admitting: Internal Medicine

## 2013-11-18 ENCOUNTER — Other Ambulatory Visit: Payer: Self-pay | Admitting: *Deleted

## 2013-11-18 MED ORDER — OXYCODONE HCL 10 MG PO TABS: 10.0000 mg | ORAL_TABLET | Freq: Four times a day (QID) | ORAL | Status: DC | PRN

## 2013-11-18 NOTE — Telephone Encounter (Signed)
Pt is requesting a refill of his Oxycodone HCl 10 MG TABS. Please advise.

## 2013-12-02 ENCOUNTER — Telehealth: Payer: Self-pay | Admitting: Internal Medicine

## 2013-12-02 NOTE — Telephone Encounter (Signed)
eliquis samples up front but no lantus is  Available-- continue to m onitor bp and if it goes up much more,let dr Arnoldo Morale know

## 2013-12-02 NOTE — Telephone Encounter (Signed)
Pt requesting Eliquist and Lantus samples if available.  Also, pt needs to know if diltiazem (CARDIZEM CD) 180 MG 24 hr capsule should be increased back to 220 since his bp has increased to 140/80.  The dosage was previously lowered because pt complained of dizziness.

## 2013-12-06 ENCOUNTER — Ambulatory Visit (INDEPENDENT_AMBULATORY_CARE_PROVIDER_SITE_OTHER): Payer: Medicare Other | Admitting: Internal Medicine

## 2013-12-06 ENCOUNTER — Encounter: Payer: Self-pay | Admitting: Internal Medicine

## 2013-12-06 VITALS — BP 140/60 | HR 76 | Temp 98.0°F | Resp 18 | Ht 70.0 in | Wt 176.0 lb

## 2013-12-06 DIAGNOSIS — L97909 Non-pressure chronic ulcer of unspecified part of unspecified lower leg with unspecified severity: Secondary | ICD-10-CM

## 2013-12-06 DIAGNOSIS — I83009 Varicose veins of unspecified lower extremity with ulcer of unspecified site: Secondary | ICD-10-CM

## 2013-12-06 DIAGNOSIS — E663 Overweight: Secondary | ICD-10-CM

## 2013-12-06 DIAGNOSIS — I83013 Varicose veins of right lower extremity with ulcer of ankle: Secondary | ICD-10-CM | POA: Insufficient documentation

## 2013-12-06 DIAGNOSIS — I872 Venous insufficiency (chronic) (peripheral): Secondary | ICD-10-CM

## 2013-12-06 DIAGNOSIS — L97319 Non-pressure chronic ulcer of right ankle with unspecified severity: Principal | ICD-10-CM

## 2013-12-06 NOTE — Progress Notes (Signed)
   Subjective:    Patient ID: [name], male    DOB: [birth year], 79 y.o.   MRN: [id number]  HPIsignificant peripheral edema and venous stasis dermatitis treated with Unna boots begin therapy for approximately 3 months with significant improvement in edema of legs and stasis dermatitis however ulceration on the lateral aspects of the right heel has persisted and has eschar formation  Is no evidence of superficial infection at the site however there is poor healing it is a stage III ulcer.  We'll refer him to the wound center he may require debridement and I would most probably recommend gel treatment to encourage regrowth of tissue.      Review of Systems  Constitutional: Negative for fever and fatigue.  HENT: Negative for congestion, hearing loss and postnasal drip.   Eyes: Negative for discharge, redness and visual disturbance.  Respiratory: Negative for cough, shortness of breath and wheezing.   Cardiovascular: Negative for leg swelling.  Gastrointestinal: Negative for abdominal pain, constipation and abdominal distention.  Genitourinary: Negative for urgency and frequency.  Musculoskeletal: Negative for arthralgias, joint swelling and neck pain.  Skin: Positive for color change. Negative for rash.  Neurological: Negative for weakness and light-headedness.  Hematological: Negative for adenopathy.  Psychiatric/Behavioral: Negative for behavioral problems.       Objective:   Physical Exam  Nursing note and vitals reviewed. Constitutional: He appears well-developed and well-nourished.  HENT:  Head: Normocephalic and atraumatic.  Eyes: Conjunctivae are normal. Pupils are equal, round, and reactive to light.  Neck: Normal range of motion. Neck supple.  Cardiovascular: Normal rate and regular rhythm.   Pulmonary/Chest: Effort normal and breath sounds normal.  Abdominal: Soft. Bowel sounds are normal.   There is a greenish color to the discharge from the stage III  ulceration on the lateral aspect of the right foot.         Assessment & Plan:  Continue the application of gel to the wound while awaiting referral to the wound center for further investigation and treatment plan.  There is no current evidence of cellulitis however if the redness starts to increase up to heal he'll contact our office and we would initiate antibiotic therapy prior to the wound center visit I recommend he go ahead and get the boot placed today as planned since a referral to the wound clinic we'll probably be

## 2013-12-06 NOTE — Patient Instructions (Signed)
The patient is instructed to continue all medications as prescribed. Schedule followup with check out clerk upon leaving the clinic  

## 2013-12-06 NOTE — Progress Notes (Signed)
Pre visit review using our clinic review tool, if applicable. No additional management support is needed unless otherwise documented below in the visit note. 

## 2013-12-12 ENCOUNTER — Ambulatory Visit (HOSPITAL_COMMUNITY)
Admission: RE | Admit: 2013-12-12 | Discharge: 2013-12-12 | Disposition: A | Discharge status: Home / Self Care | Payer: Medicare Other | Source: Ambulatory Visit | Attending: Internal Medicine | Admitting: Internal Medicine

## 2013-12-12 ENCOUNTER — Encounter (HOSPITAL_BASED_OUTPATIENT_CLINIC_OR_DEPARTMENT_OTHER): Payer: Medicare Other | Attending: Internal Medicine

## 2013-12-12 ENCOUNTER — Other Ambulatory Visit (HOSPITAL_BASED_OUTPATIENT_CLINIC_OR_DEPARTMENT_OTHER): Payer: Self-pay | Admitting: Internal Medicine

## 2013-12-12 DIAGNOSIS — I739 Peripheral vascular disease, unspecified: Secondary | ICD-10-CM | POA: Insufficient documentation

## 2013-12-12 DIAGNOSIS — X58XXXA Exposure to other specified factors, initial encounter: Secondary | ICD-10-CM | POA: Insufficient documentation

## 2013-12-12 DIAGNOSIS — I87319 Chronic venous hypertension (idiopathic) with ulcer of unspecified lower extremity: Secondary | ICD-10-CM | POA: Insufficient documentation

## 2013-12-12 DIAGNOSIS — E119 Type 2 diabetes mellitus without complications: Secondary | ICD-10-CM | POA: Insufficient documentation

## 2013-12-12 DIAGNOSIS — Z950 Presence of cardiac pacemaker: Secondary | ICD-10-CM | POA: Insufficient documentation

## 2013-12-12 DIAGNOSIS — E1169 Type 2 diabetes mellitus with other specified complication: Secondary | ICD-10-CM | POA: Insufficient documentation

## 2013-12-12 DIAGNOSIS — I10 Essential (primary) hypertension: Secondary | ICD-10-CM | POA: Insufficient documentation

## 2013-12-12 DIAGNOSIS — Z79899 Other long term (current) drug therapy: Secondary | ICD-10-CM | POA: Insufficient documentation

## 2013-12-12 DIAGNOSIS — L97909 Non-pressure chronic ulcer of unspecified part of unspecified lower leg with unspecified severity: Secondary | ICD-10-CM | POA: Insufficient documentation

## 2013-12-12 DIAGNOSIS — S91309A Unspecified open wound, unspecified foot, initial encounter: Secondary | ICD-10-CM | POA: Insufficient documentation

## 2013-12-12 DIAGNOSIS — Z794 Long term (current) use of insulin: Secondary | ICD-10-CM | POA: Insufficient documentation

## 2013-12-12 DIAGNOSIS — M949 Disorder of cartilage, unspecified: Secondary | ICD-10-CM

## 2013-12-12 DIAGNOSIS — M899 Disorder of bone, unspecified: Secondary | ICD-10-CM | POA: Insufficient documentation

## 2013-12-12 DIAGNOSIS — R609 Edema, unspecified: Secondary | ICD-10-CM | POA: Insufficient documentation

## 2013-12-12 DIAGNOSIS — I872 Venous insufficiency (chronic) (peripheral): Secondary | ICD-10-CM | POA: Insufficient documentation

## 2013-12-12 DIAGNOSIS — I517 Cardiomegaly: Secondary | ICD-10-CM | POA: Insufficient documentation

## 2013-12-12 DIAGNOSIS — L97409 Non-pressure chronic ulcer of unspecified heel and midfoot with unspecified severity: Secondary | ICD-10-CM | POA: Insufficient documentation

## 2013-12-13 NOTE — Progress Notes (Signed)
[facility]  NAME:  [name], [name]              ACCOUNT NO.:  [id number]  MEDICAL RECORD NO.:  [id number]      DATE OF BIRTH:  02-15-1935  PHYSICIAN:  Ricard Dillon, M.D. VISIT DATE:  12/12/2013                                  OFFICE VISIT   HISTORY OF PRESENT ILLNESS:  This is a 79 year old man with a history of type 2 diabetes, significant venous stasis and peripheral edema.  He has been treated for wounds on his legs which I think are mostly venous stasis ulceration with Unna boots for 3 months.  He also had home health working on a wound on his right lateral heel.  He is here for our review of the right lateral heel wound, was seen by Dr. Arnoldo Morale who was his primary physician earlier in the week.  The patient reports that since then the pain in the heel has increased.  I am not completely certain what he has been using on the wound topically at this point.  Home health has finished with her care of his wound and he has been discharged from home health services  MEDICAL HISTORY:  Includes diabetes, atrial fibrillation, permanent pacemaker, gout, osteoarthritis, peripheral vascular disease, peripheral arterial disease, hypertension, neuropathy, cataract surgery, remote history of staph infection in his back with question endocarditis 20 years ago.  CURRENT MEDICATIONS: 1. Eliquis 5 mg twice a day. 2. Digoxin 0.125 daily. 3. Cardizem 180 daily. 4. Furosemide 80 mg daily. 5. Lantus 25 units subcu at bedtime. 6. NovoLog sliding scale. 7. Glucophage 500 b.i.d. 8. Robaxin 500 b.i.d. p.r.n. 9. Prilosec 20 b.i.d. 10.Oxycodone 10 mg q.6 p.r.n.  PHYSICAL EXAMINATION:  VITAL SIGNS:  Temperature is 99.3, pulse 88, respirations 16, blood pressure 156/75, blood glucose was 194.  Vascular, his ABIs on the right were calculated in this clinic at 0.97. He has bilateral venous stasis with superficial changes in the right leg.  The major wound is he was  actually on the lateral aspect of his right heel.  The area in question measured 2.5 x 3 x 0.7.  This required a extensive surgical debridement of necrotic tissue.  Hemostasis required silver nitrate and direct pressure.  The degree of necrotic tissue removed was substantial and even after this, the base of this does not look particularly viable.  Also of concern surrounding the wound was a considerable degree of erythema and tenderness.  This was raised the possibility of cellulitis.  The patient also has venous hypertension with changes of venous stasis. Several superficial lesions on predominantly his right anterior leg. His legs are edematous Rt. > left  IMPRESSION: 1. Wegener's grade 3 diabetic foot ulcer as described.  This was     debrided as much as the patient could tolerate.  The wound will be     dressed with Santyl, hydrogel with a Kerlix Coban wrap.  I did a     empiric culture post debridement.  Thus, I provided him with empiric doxycycline and ciprofloxacin.  A plain x-ray of the heel was ordered as well as     arterial and venous Dopplers. 2. Significant chronic venous hypertension with venous stasis     ulceration.  At this point, I thought the Kerlix Coban wrap would  probably be satisfactory here.  The patient had questions about the     advanced treatment options he had read about online.  However, this wound      is not yet ready     for anything, but further debridement both enzymatically and     mechanically.  Control of the infection and excluding underlying     deep tissue or osteomyelitis is also going to be necessary.          ______________________________ Ricard Dillon, M.D.     MGR/MEDQ  D:  12/12/2013  T:  12/13/2013  Job:  [id number]

## 2013-12-16 ENCOUNTER — Ambulatory Visit (HOSPITAL_COMMUNITY)
Admission: RE | Admit: 2013-12-16 | Discharge: 2013-12-16 | Disposition: A | Discharge status: Home / Self Care | Payer: Medicare Other | Source: Ambulatory Visit | Attending: Surgery | Admitting: Surgery

## 2013-12-16 ENCOUNTER — Other Ambulatory Visit: Payer: Self-pay | Admitting: *Deleted

## 2013-12-16 ENCOUNTER — Other Ambulatory Visit (HOSPITAL_BASED_OUTPATIENT_CLINIC_OR_DEPARTMENT_OTHER): Payer: Self-pay | Admitting: Internal Medicine

## 2013-12-16 ENCOUNTER — Telehealth: Payer: Self-pay | Admitting: Internal Medicine

## 2013-12-16 ENCOUNTER — Ambulatory Visit (INDEPENDENT_AMBULATORY_CARE_PROVIDER_SITE_OTHER)
Admission: RE | Admit: 2013-12-16 | Discharge: 2013-12-16 | Disposition: A | Discharge status: Home / Self Care | Payer: Medicare Other | Source: Ambulatory Visit | Attending: Surgery | Admitting: Surgery

## 2013-12-16 DIAGNOSIS — R609 Edema, unspecified: Secondary | ICD-10-CM

## 2013-12-16 DIAGNOSIS — L97409 Non-pressure chronic ulcer of unspecified heel and midfoot with unspecified severity: Principal | ICD-10-CM

## 2013-12-16 DIAGNOSIS — E119 Type 2 diabetes mellitus without complications: Secondary | ICD-10-CM | POA: Insufficient documentation

## 2013-12-16 DIAGNOSIS — E1169 Type 2 diabetes mellitus with other specified complication: Secondary | ICD-10-CM

## 2013-12-16 DIAGNOSIS — R600 Localized edema: Secondary | ICD-10-CM

## 2013-12-16 DIAGNOSIS — E785 Hyperlipidemia, unspecified: Secondary | ICD-10-CM | POA: Insufficient documentation

## 2013-12-16 DIAGNOSIS — E11621 Type 2 diabetes mellitus with foot ulcer: Secondary | ICD-10-CM

## 2013-12-16 DIAGNOSIS — I10 Essential (primary) hypertension: Secondary | ICD-10-CM | POA: Insufficient documentation

## 2013-12-16 DIAGNOSIS — Z87891 Personal history of nicotine dependence: Secondary | ICD-10-CM | POA: Insufficient documentation

## 2013-12-16 MED ORDER — OXYCODONE HCL 10 MG PO TABS: 10.0000 mg | ORAL_TABLET | Freq: Four times a day (QID) | ORAL | Status: DC | PRN

## 2013-12-16 NOTE — Telephone Encounter (Signed)
Pt is requesting a refill of his Oxycodone HCl 10 MG TABS. Please call when ready for pick up. Pt also wants to update you on what progress is being made at the wound center.

## 2013-12-16 NOTE — Telephone Encounter (Signed)
presription is ready for pick up

## 2013-12-20 ENCOUNTER — Other Ambulatory Visit (HOSPITAL_BASED_OUTPATIENT_CLINIC_OR_DEPARTMENT_OTHER): Payer: Self-pay | Admitting: Internal Medicine

## 2013-12-20 DIAGNOSIS — L97509 Non-pressure chronic ulcer of other part of unspecified foot with unspecified severity: Principal | ICD-10-CM

## 2013-12-20 DIAGNOSIS — E11621 Type 2 diabetes mellitus with foot ulcer: Secondary | ICD-10-CM

## 2013-12-23 ENCOUNTER — Encounter (HOSPITAL_BASED_OUTPATIENT_CLINIC_OR_DEPARTMENT_OTHER): Payer: Medicare Other | Attending: Internal Medicine

## 2013-12-23 DIAGNOSIS — E1169 Type 2 diabetes mellitus with other specified complication: Secondary | ICD-10-CM | POA: Insufficient documentation

## 2013-12-23 DIAGNOSIS — B965 Pseudomonas (aeruginosa) (mallei) (pseudomallei) as the cause of diseases classified elsewhere: Secondary | ICD-10-CM | POA: Insufficient documentation

## 2013-12-23 DIAGNOSIS — L97409 Non-pressure chronic ulcer of unspecified heel and midfoot with unspecified severity: Secondary | ICD-10-CM | POA: Insufficient documentation

## 2013-12-23 DIAGNOSIS — M86679 Other chronic osteomyelitis, unspecified ankle and foot: Secondary | ICD-10-CM | POA: Insufficient documentation

## 2013-12-24 ENCOUNTER — Ambulatory Visit (HOSPITAL_COMMUNITY): Payer: Medicare Other

## 2013-12-25 ENCOUNTER — Ambulatory Visit (HOSPITAL_COMMUNITY)
Admission: RE | Admit: 2013-12-25 | Discharge: 2013-12-25 | Disposition: A | Discharge status: Home / Self Care | Payer: Medicare Other | Source: Ambulatory Visit | Attending: Internal Medicine | Admitting: Internal Medicine

## 2013-12-25 ENCOUNTER — Encounter (HOSPITAL_COMMUNITY): Payer: Self-pay

## 2013-12-25 DIAGNOSIS — M19079 Primary osteoarthritis, unspecified ankle and foot: Secondary | ICD-10-CM | POA: Insufficient documentation

## 2013-12-25 DIAGNOSIS — L97509 Non-pressure chronic ulcer of other part of unspecified foot with unspecified severity: Secondary | ICD-10-CM | POA: Insufficient documentation

## 2013-12-25 DIAGNOSIS — M869 Osteomyelitis, unspecified: Secondary | ICD-10-CM | POA: Insufficient documentation

## 2013-12-25 DIAGNOSIS — E11621 Type 2 diabetes mellitus with foot ulcer: Secondary | ICD-10-CM

## 2013-12-25 DIAGNOSIS — M109 Gout, unspecified: Secondary | ICD-10-CM | POA: Insufficient documentation

## 2013-12-25 DIAGNOSIS — M2420 Disorder of ligament, unspecified site: Secondary | ICD-10-CM | POA: Insufficient documentation

## 2013-12-25 DIAGNOSIS — M629 Disorder of muscle, unspecified: Secondary | ICD-10-CM | POA: Insufficient documentation

## 2013-12-25 MED ORDER — IOHEXOL 300 MG/ML  SOLN
100.0000 mL | Freq: Once | INTRAMUSCULAR | Status: AC | PRN
  Administered 2013-12-25: 100 mL via INTRAVENOUS

## 2014-01-01 LAB — GLUCOSE, CAPILLARY
Glucose-Capillary: 159 mg/dL — ABNORMAL HIGH (ref 70–99)
Glucose-Capillary: 172 mg/dL — ABNORMAL HIGH (ref 70–99)
Glucose-Capillary: 165 mg/dL — ABNORMAL HIGH (ref 70–99)

## 2014-01-02 LAB — GLUCOSE, CAPILLARY
GLUCOSE-CAPILLARY: 169 mg/dL — AB (ref 70–99)
GLUCOSE-CAPILLARY: 160 mg/dL — AB (ref 70–99)

## 2014-01-03 LAB — GLUCOSE, CAPILLARY
GLUCOSE-CAPILLARY: 190 mg/dL — AB (ref 70–99)
Glucose-Capillary: 162 mg/dL — ABNORMAL HIGH (ref 70–99)

## 2014-01-06 LAB — GLUCOSE, CAPILLARY
GLUCOSE-CAPILLARY: 238 mg/dL — AB (ref 70–99)
Glucose-Capillary: 205 mg/dL — ABNORMAL HIGH (ref 70–99)

## 2014-01-08 LAB — GLUCOSE, CAPILLARY
Glucose-Capillary: 172 mg/dL — ABNORMAL HIGH (ref 70–99)
Glucose-Capillary: 177 mg/dL — ABNORMAL HIGH (ref 70–99)

## 2014-01-09 ENCOUNTER — Other Ambulatory Visit: Payer: Self-pay | Admitting: Internal Medicine

## 2014-01-09 ENCOUNTER — Ambulatory Visit (INDEPENDENT_AMBULATORY_CARE_PROVIDER_SITE_OTHER): Payer: Medicare Other | Admitting: Infectious Disease

## 2014-01-09 ENCOUNTER — Encounter: Payer: Self-pay | Admitting: Infectious Disease

## 2014-01-09 VITALS — BP 151/77 | HR 125 | Temp 98.6°F | Wt 169.0 lb

## 2014-01-09 DIAGNOSIS — I739 Peripheral vascular disease, unspecified: Secondary | ICD-10-CM

## 2014-01-09 DIAGNOSIS — M9080 Osteopathy in diseases classified elsewhere, unspecified site: Secondary | ICD-10-CM

## 2014-01-09 DIAGNOSIS — I495 Sick sinus syndrome: Secondary | ICD-10-CM

## 2014-01-09 DIAGNOSIS — E1169 Type 2 diabetes mellitus with other specified complication: Secondary | ICD-10-CM | POA: Insufficient documentation

## 2014-01-09 DIAGNOSIS — A498 Other bacterial infections of unspecified site: Secondary | ICD-10-CM

## 2014-01-09 DIAGNOSIS — Z950 Presence of cardiac pacemaker: Secondary | ICD-10-CM

## 2014-01-09 DIAGNOSIS — M869 Osteomyelitis, unspecified: Secondary | ICD-10-CM

## 2014-01-09 DIAGNOSIS — B965 Pseudomonas (aeruginosa) (mallei) (pseudomallei) as the cause of diseases classified elsewhere: Secondary | ICD-10-CM

## 2014-01-09 DIAGNOSIS — J151 Pneumonia due to Pseudomonas: Secondary | ICD-10-CM

## 2014-01-09 LAB — GLUCOSE, CAPILLARY
Glucose-Capillary: 207 mg/dL — ABNORMAL HIGH (ref 70–99)
Glucose-Capillary: 154 mg/dL — ABNORMAL HIGH (ref 70–99)

## 2014-01-09 MED ORDER — CIPROFLOXACIN HCL 750 MG PO TABS: 750.0000 mg | ORAL_TABLET | Freq: Two times a day (BID) | ORAL | Status: DC

## 2014-01-09 NOTE — Progress Notes (Signed)
Subjective:    Patient ID: [name], male    DOB: Jan 12, 1935, 79 y.o.   MRN: [id number]  HPI  56 six-year-old patient with past medical history significant for diabetes mellitus, CHF, atrial fibrillation on chronic anticoagulation, syncope with tachybradycardia syndrome  status post pacemaker placement who is referred to [place] by Dr. Arnoldo Morale and Dr. 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  He states he's had a wound on his heel for approximately half a year but is worsened in the last 6-8 weeks. He's been followed closely by Dr. Dellia Nims in the wound care clinic his prescribed him doxycycline and more recently ciprofloxacin after recovering Pseudomonas from a deep tissue culture.  Pseudomonas was sensitive to all antibiotics tested including ciprofloxacin and levofloxacin with MIC the left less than equal 0.25 and 0.5 also sensitive to Zosyn imipenem Ceftaz and cefepime gentamicin and tobramycin.  Patient had undergone a CT scan of the heel which showed:  Osteolysis and active osteomyelitis of the calcaneus.  He states that ciprofloxacin is improved his wound but the wound is now getting bigger after debridement from wound care and treatment with the hyperbaric chamber.  He is without fevers chills nausea or other systemic symptoms at present.  He was under the impression that I was going to need to place him on intravenous antibiotics but I told him I saw no need to urgently place a monitor intravenous antibiotics. We had an exhaustive conversation with regards the nature of osteomyelitis of the foot in diabetics in particular calcaneal osteomyelitis and methods of treatment and cure versus suppressive therapy.  Review of Systems  Constitutional: Negative for fever, chills, diaphoresis, appetite change and fatigue.  Eyes: Negative for photophobia and visual disturbance.  Respiratory: Negative for cough, chest tightness, shortness of breath and wheezing.    Cardiovascular: Negative for chest pain and palpitations.  Gastrointestinal: Negative for nausea, vomiting and diarrhea.  Musculoskeletal: Positive for arthralgias and gait problem. Negative for back pain and myalgias.  Skin: Positive for wound. Negative for color change, pallor and rash.  Neurological: Negative for dizziness, tremors, weakness and light-headedness.  Hematological: Bruises/bleeds easily.  Psychiatric/Behavioral: Negative for behavioral problems, confusion, sleep disturbance, dysphoric mood, decreased concentration and agitation.       Objective:   Physical Exam  Constitutional: He is oriented to person, place, and time. He appears well-developed and well-nourished. No distress.  HENT:  Head: Normocephalic and atraumatic.  Mouth/Throat: Oropharynx is clear and moist. No oropharyngeal exudate.  Eyes: Conjunctivae and EOM are normal. No scleral icterus.  Neck: Normal range of motion. Neck supple.  Cardiovascular: Normal rate and normal heart sounds.  An irregularly irregular rhythm present. Exam reveals no friction rub.   No murmur heard. Pulmonary/Chest: Effort normal and breath sounds normal. No respiratory distress. He has no wheezes. He has no rales. He exhibits no tenderness.    Abdominal: Soft. Bowel sounds are normal. He exhibits no distension. There is no tenderness. There is no rebound.  Musculoskeletal: He exhibits no edema and no tenderness.       Arms:      Feet:  Neurological: He is alert and oriented to person, place, and time. He exhibits normal muscle tone. Coordination normal.  Skin: Skin is warm and dry. He is not diaphoretic. There is erythema. No pallor.  Psychiatric: He has a normal mood and affect. His behavior is normal. Judgment and thought content normal.   Right open wound of heel with surrounding erythema and venous  stasis changes of the right lower extremity. There is dark exudate that is foul smelling coming from the wound.            Assessment & Plan:   Diabetic osteomyelitis of the calcaneus: Pseudomonas aeruginosa was isolated from a tissue culture and certainly as a possible culprit. There certainly also other possible culprits as well and the patient had been on doxycycline recently. He also describes a history of past staph aureus infection involving his spine that required lumbar fusion so he could certainly be colonized with MSSA or MRSA. The wound also has an "anaerobic smell" to it as well.  I told him that in my opinion the only way to effect cure here would be for him to have an aggressive surgery with a below the knee amputation.  The patient himself was very much against the idea of amputation and wished "at all costs" to avoid an amputation.  I did tell him that we could also pursue a deep bone biopsy followed by targeted intravenous antibiotics (if we can isolate an organism) followed by oral antibiotics in the hopes of improving his foot and attenuating this infection with long term suppressive oral antibiotics.  I also informed him that I was concerned about the risk of this infection spreading to his bloodstream and potentially infecting his pacemaker which would require removal of his pacemaker. The latter seems like a potentially dangerous undertaking given the fact patient previously was suffering from recurrent episodes of syncope with his tachybradycardia syndrome.  Regardless though I consider he understands his hesitancy to pursue curative surgery given his age and multiple comorbidities such surgery will not be without any risk.; Undesired he has to preserve his foot.  I've recommended that he be referred to orthopedic foot surgeon for consideration of deep biopsy of the calcaneal bone off of antibiotics.  I contacted Dr. Wylene Simmer regarding this patient and will try to arrange that appointment with him hopefully next week.  In the interim I asked the patient to continue his ciprofloxacin until we  have a clear-cut plan for possible surgery for deep biopsy. If such surgery is pursued would like him to stop his antibiotics for least a week to 10 days  prior to obtaining the deep bone cultures.  Of fluid have been obtained and a culprit organism to target with intravenous antibiotics. Should we fail to isolate a specific organism or should deep biopsy not be pursued we would need to treat him broadly with anti-pseudomonal antibiotics, antibiotics that are highly effective against streptococcal species anaerobes and also MRSA and MSSA.  I would plan on giving him a course of therapy between 6 and 8 weeks followed by long-term oral suppressive therapy.  I also explained to him that his current therapy with ciprofloxacin was actually similar to an IV antibiotic and that ciprofloxacin is highly bioavailable and he is on a very aggressive dose at 750 mg twice daily.  Pacemaker with tachy brady syndrome and atrial fibrillation: I certainly worry about the risk of his pacemaker becoming infected from a bloodstream infection with this large portal of entry in his heel and a known osteomyelitis.  I spent greater than 45 minutes with the patient including greater than 50% of time in face to face counsel of the patient and in coordination of their care.   Atrial fibrillation on chronic anticoagulation arrangements will need to be made to have him taken off and coagulation should we have surgery pursued.  Prior Staph Aureus  lumbar infection: Will be interested in what organism grew him how he was treated is not available in the records in Epic but perhaps in each chart.

## 2014-01-10 ENCOUNTER — Telehealth: Payer: Self-pay

## 2014-01-10 NOTE — Telephone Encounter (Signed)
[employer] mailed to pt  

## 2014-01-13 ENCOUNTER — Telehealth: Payer: Self-pay | Admitting: Internal Medicine

## 2014-01-13 ENCOUNTER — Other Ambulatory Visit: Payer: Self-pay | Admitting: *Deleted

## 2014-01-13 LAB — GLUCOSE, CAPILLARY
GLUCOSE-CAPILLARY: 237 mg/dL — AB (ref 70–99)
GLUCOSE-CAPILLARY: 183 mg/dL — AB (ref 70–99)
Glucose-Capillary: 275 mg/dL — ABNORMAL HIGH (ref 70–99)
Glucose-Capillary: 204 mg/dL — ABNORMAL HIGH (ref 70–99)

## 2014-01-13 NOTE — Telephone Encounter (Signed)
News problem   Pt would like to speak to nurse concerning serious diabetic ulcer on rt foot he was told about amputation it. Please call pt.

## 2014-01-13 NOTE — Telephone Encounter (Signed)
Pt is having upcoming surgery and Shawn from Dr. Leafy Ro office needs to talk to you about pt stopping coumadin.

## 2014-01-13 NOTE — Telephone Encounter (Signed)
Per dr Arnoldo Morale- hold for 5 days prior and start back the day after surgery- they were informed

## 2014-01-13 NOTE — Telephone Encounter (Signed)
Pt stating right heel wound 1"x1"x1", infection to bone. Pt currently seeing Dr. Drucilla Schmidt and prescribed Ciprofloxacin 750 mg BID. Concern for infection possibly spreading to PPM and would like to know risks. Will review with device clinic and Dr Caryl Comes to determine recommendations, if any.  Pt agreeable to plan.

## 2014-01-14 NOTE — Telephone Encounter (Signed)
Advised pt that nothing to worry with as long as bone infection stays localized and doesn't become systemic/blood stream. Pt relieved to hear news, and thankful for calling back so quickly.

## 2014-01-16 ENCOUNTER — Encounter (HOSPITAL_COMMUNITY): Payer: Self-pay | Admitting: Emergency Medicine

## 2014-01-16 ENCOUNTER — Telehealth: Payer: Self-pay | Admitting: Geriatric Medicine

## 2014-01-16 ENCOUNTER — Inpatient Hospital Stay (HOSPITAL_COMMUNITY)
Admission: EM | Admit: 2014-01-16 | Discharge: 2014-01-24 | DRG: 617 | Disposition: A | Discharge status: Skilled Nursing Facility | Payer: Medicare Other | Attending: Internal Medicine | Admitting: Internal Medicine

## 2014-01-16 ENCOUNTER — Emergency Department (HOSPITAL_COMMUNITY): Payer: Medicare Other

## 2014-01-16 DIAGNOSIS — E1159 Type 2 diabetes mellitus with other circulatory complications: Secondary | ICD-10-CM | POA: Diagnosis present

## 2014-01-16 DIAGNOSIS — M9080 Osteopathy in diseases classified elsewhere, unspecified site: Secondary | ICD-10-CM

## 2014-01-16 DIAGNOSIS — I5032 Chronic diastolic (congestive) heart failure: Secondary | ICD-10-CM | POA: Diagnosis present

## 2014-01-16 DIAGNOSIS — I10 Essential (primary) hypertension: Secondary | ICD-10-CM | POA: Diagnosis present

## 2014-01-16 DIAGNOSIS — I509 Heart failure, unspecified: Secondary | ICD-10-CM

## 2014-01-16 DIAGNOSIS — R000 Tachycardia, unspecified: Secondary | ICD-10-CM | POA: Diagnosis present

## 2014-01-16 DIAGNOSIS — E1169 Type 2 diabetes mellitus with other specified complication: Principal | ICD-10-CM | POA: Diagnosis present

## 2014-01-16 DIAGNOSIS — I4891 Unspecified atrial fibrillation: Secondary | ICD-10-CM | POA: Diagnosis present

## 2014-01-16 DIAGNOSIS — A491 Streptococcal infection, unspecified site: Secondary | ICD-10-CM

## 2014-01-16 DIAGNOSIS — L02619 Cutaneous abscess of unspecified foot: Secondary | ICD-10-CM | POA: Diagnosis present

## 2014-01-16 DIAGNOSIS — Z7901 Long term (current) use of anticoagulants: Secondary | ICD-10-CM

## 2014-01-16 DIAGNOSIS — M7500 Adhesive capsulitis of unspecified shoulder: Secondary | ICD-10-CM

## 2014-01-16 DIAGNOSIS — M549 Dorsalgia, unspecified: Secondary | ICD-10-CM

## 2014-01-16 DIAGNOSIS — Z981 Arthrodesis status: Secondary | ICD-10-CM

## 2014-01-16 DIAGNOSIS — E1142 Type 2 diabetes mellitus with diabetic polyneuropathy: Secondary | ICD-10-CM | POA: Diagnosis present

## 2014-01-16 DIAGNOSIS — Z794 Long term (current) use of insulin: Secondary | ICD-10-CM

## 2014-01-16 DIAGNOSIS — B951 Streptococcus, group B, as the cause of diseases classified elsewhere: Secondary | ICD-10-CM | POA: Diagnosis present

## 2014-01-16 DIAGNOSIS — E1149 Type 2 diabetes mellitus with other diabetic neurological complication: Secondary | ICD-10-CM | POA: Diagnosis present

## 2014-01-16 DIAGNOSIS — Z8249 Family history of ischemic heart disease and other diseases of the circulatory system: Secondary | ICD-10-CM

## 2014-01-16 DIAGNOSIS — K219 Gastro-esophageal reflux disease without esophagitis: Secondary | ICD-10-CM | POA: Diagnosis present

## 2014-01-16 DIAGNOSIS — D638 Anemia in other chronic diseases classified elsewhere: Secondary | ICD-10-CM | POA: Diagnosis present

## 2014-01-16 DIAGNOSIS — L570 Actinic keratosis: Secondary | ICD-10-CM

## 2014-01-16 DIAGNOSIS — E119 Type 2 diabetes mellitus without complications: Secondary | ICD-10-CM

## 2014-01-16 DIAGNOSIS — L02419 Cutaneous abscess of limb, unspecified: Secondary | ICD-10-CM

## 2014-01-16 DIAGNOSIS — L03119 Cellulitis of unspecified part of limb: Secondary | ICD-10-CM

## 2014-01-16 DIAGNOSIS — L98499 Non-pressure chronic ulcer of skin of other sites with unspecified severity: Secondary | ICD-10-CM | POA: Diagnosis present

## 2014-01-16 DIAGNOSIS — I739 Peripheral vascular disease, unspecified: Secondary | ICD-10-CM | POA: Diagnosis present

## 2014-01-16 DIAGNOSIS — Z885 Allergy status to narcotic agent status: Secondary | ICD-10-CM

## 2014-01-16 DIAGNOSIS — Z950 Presence of cardiac pacemaker: Secondary | ICD-10-CM

## 2014-01-16 DIAGNOSIS — M869 Osteomyelitis, unspecified: Secondary | ICD-10-CM | POA: Diagnosis present

## 2014-01-16 DIAGNOSIS — L0390 Cellulitis, unspecified: Secondary | ICD-10-CM

## 2014-01-16 DIAGNOSIS — R1310 Dysphagia, unspecified: Secondary | ICD-10-CM

## 2014-01-16 DIAGNOSIS — Z833 Family history of diabetes mellitus: Secondary | ICD-10-CM

## 2014-01-16 DIAGNOSIS — L97409 Non-pressure chronic ulcer of unspecified heel and midfoot with unspecified severity: Secondary | ICD-10-CM | POA: Diagnosis present

## 2014-01-16 LAB — BASIC METABOLIC PANEL
BUN: 17 mg/dL (ref 6–23)
CO2: 26 mEq/L (ref 19–32)
CREATININE: 1.19 mg/dL (ref 0.50–1.35)
Calcium: 9.4 mg/dL (ref 8.4–10.5)
Chloride: 93 mEq/L — ABNORMAL LOW (ref 96–112)
GFR, EST AFRICAN AMERICAN: 67 mL/min — AB (ref >90)
GFR, EST NON AFRICAN AMERICAN: 58 mL/min — AB (ref >90)
Glucose, Bld: 191 mg/dL — ABNORMAL HIGH (ref 70–99)
POTASSIUM: 4.7 meq/L (ref 3.7–5.3)
Sodium: 131 mEq/L — ABNORMAL LOW (ref 137–147)

## 2014-01-16 LAB — CBC WITH DIFFERENTIAL/PLATELET
Basophils Absolute: 0 10*3/uL (ref 0.0–0.1)
Basophils Relative: 0 % (ref 0–1)
Eosinophils Absolute: 0.1 10*3/uL (ref 0.0–0.7)
Eosinophils Relative: 1 % (ref 0–5)
HCT: 27.4 % — ABNORMAL LOW (ref 39.0–52.0)
Hemoglobin: 9.2 g/dL — ABNORMAL LOW (ref 13.0–17.0)
LYMPHS PCT: 13 % (ref 12–46)
Lymphs Abs: 1.2 10*3/uL (ref 0.7–4.0)
MCH: 27.3 pg (ref 26.0–34.0)
MCHC: 33.6 g/dL (ref 30.0–36.0)
MCV: 81.3 fL (ref 78.0–100.0)
Monocytes Absolute: 0.6 10*3/uL (ref 0.1–1.0)
Monocytes Relative: 7 % (ref 3–12)
NEUTROS ABS: 7.1 10*3/uL (ref 1.7–7.7)
Neutrophils Relative %: 79 % — ABNORMAL HIGH (ref 43–77)
PLATELETS: 239 10*3/uL (ref 150–400)
RBC: 3.37 MIL/uL — ABNORMAL LOW (ref 4.22–5.81)
RDW: 13.8 % (ref 11.5–15.5)
WBC: 9 10*3/uL (ref 4.0–10.5)

## 2014-01-16 LAB — GLUCOSE, CAPILLARY
GLUCOSE-CAPILLARY: 224 mg/dL — AB (ref 70–99)
Glucose-Capillary: 186 mg/dL — ABNORMAL HIGH (ref 70–99)
Glucose-Capillary: 158 mg/dL — ABNORMAL HIGH (ref 70–99)

## 2014-01-16 LAB — I-STAT CG4 LACTIC ACID, ED: LACTIC ACID, VENOUS: 1.62 mmol/L (ref 0.5–2.2)

## 2014-01-16 LAB — URINALYSIS, ROUTINE W REFLEX MICROSCOPIC
Bilirubin Urine: NEGATIVE
GLUCOSE, UA: 100 mg/dL — AB
KETONES UR: NEGATIVE mg/dL
Leukocytes, UA: NEGATIVE
NITRITE: NEGATIVE
Specific Gravity, Urine: 1.018 (ref 1.005–1.030)
Urobilinogen, UA: 0.2 mg/dL (ref 0.0–1.0)
pH: 7 (ref 5.0–8.0)

## 2014-01-16 LAB — CBC
HCT: 30 % — ABNORMAL LOW (ref 39.0–52.0)
Hemoglobin: 10.2 g/dL — ABNORMAL LOW (ref 13.0–17.0)
MCH: 27.6 pg (ref 26.0–34.0)
MCHC: 34 g/dL (ref 30.0–36.0)
MCV: 81.3 fL (ref 78.0–100.0)
PLATELETS: 296 10*3/uL (ref 150–400)
RBC: 3.69 MIL/uL — ABNORMAL LOW (ref 4.22–5.81)
RDW: 13.7 % (ref 11.5–15.5)
WBC: 11.1 10*3/uL — ABNORMAL HIGH (ref 4.0–10.5)

## 2014-01-16 LAB — URINE MICROSCOPIC-ADD ON

## 2014-01-16 LAB — PRO B NATRIURETIC PEPTIDE: PRO B NATRI PEPTIDE: 3148 pg/mL — AB (ref 0–450)

## 2014-01-16 MED ORDER — ZOLPIDEM TARTRATE 5 MG PO TABS
5.0000 mg | ORAL_TABLET | Freq: Once | ORAL | Status: AC
  Administered 2014-01-16: 5 mg via ORAL
  Filled 2014-01-16: qty 1

## 2014-01-16 MED ORDER — DILTIAZEM HCL ER COATED BEADS 180 MG PO CP24
180.0000 mg | ORAL_CAPSULE | Freq: Every day | ORAL | Status: DC
  Administered 2014-01-16 – 2014-01-24 (×9): 180 mg via ORAL
  Filled 2014-01-16 (×9): qty 1

## 2014-01-16 MED ORDER — HYDROMORPHONE HCL PF 1 MG/ML IJ SOLN
0.5000 mg | INTRAMUSCULAR | Status: DC | PRN
  Administered 2014-01-17 – 2014-01-23 (×7): 0.5 mg via INTRAVENOUS
  Filled 2014-01-16 (×6): qty 1

## 2014-01-16 MED ORDER — SODIUM CHLORIDE 0.9 % IV SOLN
INTRAVENOUS | Status: AC
  Administered 2014-01-16: 21:00:00 via INTRAVENOUS

## 2014-01-16 MED ORDER — OXYCODONE HCL 5 MG PO TABS
5.0000 mg | ORAL_TABLET | ORAL | Status: AC
  Administered 2014-01-16: 5 mg via ORAL
  Filled 2014-01-16: qty 1

## 2014-01-16 MED ORDER — VANCOMYCIN HCL IN DEXTROSE 750-5 MG/150ML-% IV SOLN
750.0000 mg | Freq: Two times a day (BID) | INTRAVENOUS | Status: DC
  Filled 2014-01-16 (×2): qty 150

## 2014-01-16 MED ORDER — PIPERACILLIN-TAZOBACTAM 3.375 G IVPB 30 MIN
3.3750 g | Freq: Once | INTRAVENOUS | Status: AC
  Administered 2014-01-16: 3.375 g via INTRAVENOUS
  Filled 2014-01-16: qty 50

## 2014-01-16 MED ORDER — ONDANSETRON HCL 4 MG PO TABS: 4.0000 mg | ORAL_TABLET | Freq: Four times a day (QID) | ORAL | Status: DC | PRN

## 2014-01-16 MED ORDER — SODIUM CHLORIDE 0.9 % IV SOLN
1000.0000 mL | INTRAVENOUS | Status: DC
  Administered 2014-01-16: 1000 mL via INTRAVENOUS

## 2014-01-16 MED ORDER — INSULIN ASPART 100 UNIT/ML ~~LOC~~ SOLN: 0.0000 [IU] | Freq: Every day | SUBCUTANEOUS | Status: DC

## 2014-01-16 MED ORDER — SODIUM CHLORIDE 0.9 % IV SOLN
1000.0000 mL | Freq: Once | INTRAVENOUS | Status: AC
  Administered 2014-01-16: 1000 mL via INTRAVENOUS

## 2014-01-16 MED ORDER — DIGOXIN 125 MCG PO TABS
0.1250 mg | ORAL_TABLET | Freq: Every day | ORAL | Status: DC
  Administered 2014-01-16 – 2014-01-24 (×9): 0.125 mg via ORAL
  Filled 2014-01-16 (×9): qty 1

## 2014-01-16 MED ORDER — OXYCODONE HCL 5 MG PO TABS
10.0000 mg | ORAL_TABLET | Freq: Four times a day (QID) | ORAL | Status: DC | PRN
  Administered 2014-01-16 – 2014-01-24 (×21): 10 mg via ORAL
  Filled 2014-01-16 (×21): qty 2

## 2014-01-16 MED ORDER — ONDANSETRON HCL 4 MG/2ML IJ SOLN: 4.0000 mg | Freq: Four times a day (QID) | INTRAMUSCULAR | Status: DC | PRN

## 2014-01-16 MED ORDER — INSULIN ASPART 100 UNIT/ML ~~LOC~~ SOLN
0.0000 [IU] | Freq: Three times a day (TID) | SUBCUTANEOUS | Status: DC
  Administered 2014-01-17: 2 [IU] via SUBCUTANEOUS

## 2014-01-16 MED ORDER — INSULIN GLARGINE 100 UNIT/ML ~~LOC~~ SOLN
10.0000 [IU] | Freq: Every day | SUBCUTANEOUS | Status: DC
  Administered 2014-01-16 – 2014-01-23 (×8): 10 [IU] via SUBCUTANEOUS
  Filled 2014-01-16 (×10): qty 0.1

## 2014-01-16 MED ORDER — PANTOPRAZOLE SODIUM 40 MG PO TBEC
40.0000 mg | DELAYED_RELEASE_TABLET | Freq: Every day | ORAL | Status: DC
  Administered 2014-01-16 – 2014-01-24 (×9): 40 mg via ORAL
  Filled 2014-01-16 (×9): qty 1

## 2014-01-16 MED ORDER — PIPERACILLIN-TAZOBACTAM 3.375 G IVPB
3.3750 g | Freq: Three times a day (TID) | INTRAVENOUS | Status: DC
  Administered 2014-01-16: 3.375 g via INTRAVENOUS
  Filled 2014-01-16 (×3): qty 50

## 2014-01-16 MED ORDER — VANCOMYCIN HCL IN DEXTROSE 1-5 GM/200ML-% IV SOLN
1000.0000 mg | Freq: Once | INTRAVENOUS | Status: AC
  Administered 2014-01-16: 1000 mg via INTRAVENOUS
  Filled 2014-01-16: qty 200

## 2014-01-16 MED ORDER — APIXABAN 5 MG PO TABS
5.0000 mg | ORAL_TABLET | Freq: Two times a day (BID) | ORAL | Status: DC
  Administered 2014-01-16: 5 mg via ORAL
  Filled 2014-01-16 (×3): qty 1

## 2014-01-16 NOTE — Telephone Encounter (Signed)
Phone call from patient today. He had an appointment scheduled with Dr.Robson at the [facility] today re: wound on his right foot. Due to inclement weather the wound center is closed. He reports this foot is much more painful and now has a very bad odor. The patient tells me he is scheduled for surgery next week with Dr. Migdalia Dk  "to save my foot". Recommend evaluation today in the ED as it sounds like the infection may be getting worse.  Mardene Celeste, NP-C [facility] [phone]

## 2014-01-16 NOTE — ED Provider Notes (Signed)
CSN: [id number]     Arrival date & time 01/16/14  1613 History   First MD Initiated Contact with Patient 01/16/14 1624     Chief Complaint  Patient presents with  . Wound Infection     HPI  79 six-year-old patient with past medical history significant for diabetes mellitus, CHF, atrial fibrillation on chronic anticoagulation, syncope with tachybradycardia syndrome status post pacemaker placement who is followed by Dr. Tommy Medal from (infectious disease) and Dr. Dellia Nims (wound care)   Patient has been dealing with a wound on his heel for approximately a year.  He's been followed closely by Dr. Dellia Nims in the wound care clinic who prescribed ciprofloxacin after recovering Pseudomonas from a deep tissue culture. Pseudomonas was sensitive to all antibiotics tested including ciprofloxacin and levofloxacin with MIC the left less than equal 0.25 and 0.5 also sensitive to Zosyn imipenem Ceftaz and cefepime gentamicin and tobramycin.   Patient had undergone a CT scan of the heel which showed:  Osteolysis and active osteomyelitis of the calcaneus.   He was seen earlier this week for wound check at clinic but over the last 24 hours has developed a foul smell and worsening pain to his RLE. He denies any fevers, chills, nausea or emesis. He called wound clinic today who asked him to present to the ED. On arrival the patient reports 6/10 pain to his RLE which worsens with movement and palpation.    Past Medical History  Diagnosis Date  . Fatigue   . Overweight   . Syncope and collapse   . GERD (gastroesophageal reflux disease)   . Right bundle branch block   . Unspecified venous (peripheral) insufficiency   . Phlebitis and thrombophlebitis of superficial vessels of lower extremities   . Cellulitis and abscess of leg, except foot   . Backache, unspecified   . Long term (current) use of anticoagulants   . Type II or unspecified type diabetes mellitus without mention of complication, not stated as  uncontrolled   . Unspecified venous (peripheral) insufficiency   . Hypertension   . Gout, unspecified   . Heart failure   . Atrial fibrillation     permanent  . Esophageal stricture   . Hx of colonic polyps 05-2006    (Adenomatous)Dr. [name]  . Diverticulosis 05-2006    Dr. [name]   . Tachycardia-bradycardia     s/p PPM  . Pacemaker- [name] 02/20/2013   Past Surgical History  Procedure Laterality Date  . Laminectomy    . Lumbar fusion    . Rotator cuff repair    . Ruptured rt rectus muscle    . Eye surgery    . Pacemaker insertion  8/12    SJM by [name] for tachy/brady syndrome   Family History  Problem Relation Age of Onset  . Pneumonia Mother   . Heart disease Father   . Diabetes Father   . Colon cancer Neg Hx    History  Substance Use Topics  . Smoking status: Never Smoker   . Smokeless tobacco: Never Used  . Alcohol Use: Yes     Comment: two drinks daily     Review of Systems  Constitutional: Negative for fever, activity change and appetite change.  HENT: Negative for congestion, ear pain, rhinorrhea, sinus pressure and sore throat.   Eyes: Negative for pain and redness.  Respiratory: Negative for cough, chest tightness and shortness of breath.   Cardiovascular: Negative for chest pain and palpitations.  Gastrointestinal: Negative for  nausea, vomiting, abdominal pain, diarrhea and abdominal distention.  Genitourinary: Negative for dysuria, flank pain and difficulty urinating.  Musculoskeletal: Negative for back pain, neck pain and neck stiffness.  Skin: Positive for rash and wound.  Neurological: Negative for dizziness, light-headedness, numbness and headaches.  Hematological: Negative for adenopathy.  Psychiatric/Behavioral: Negative for behavioral problems, confusion and agitation.      Allergies  Morphine  Home Medications   Current Outpatient Rx  Name  Route  Sig  Dispense  Refill  . apixaban (ELIQUIS) 5 MG TABS tablet   Oral   Take 1  tablet (5 mg total) by mouth 2 (two) times daily.   60 tablet   11   . ciprofloxacin (CIPRO) 750 MG tablet   Oral   Take 1 tablet (750 mg total) by mouth 2 (two) times daily.   60 tablet   4   . digoxin (LANOXIN) 0.125 MG tablet   Oral   Take 0.125 mg by mouth daily.         Marland Kitchen diltiazem (CARDIZEM CD) 180 MG 24 hr capsule   Oral   Take 1 capsule (180 mg total) by mouth daily.   30 capsule   6   . furosemide (LASIX) 80 MG tablet   Oral   Take 1 tablet (80 mg total) by mouth daily.   30 tablet   3   . insulin aspart (NOVOLOG) 100 UNIT/ML injection      Sliding scale before meals-- if cbg is > 300 take 10 units:if 200-300 use 5 units- if < than 200 no insulin   1 vial   5     Dx: 250.00   . insulin glargine (LANTUS) 100 UNIT/ML injection   Subcutaneous   Inject 25 Units into the skin at bedtime.          . metFORMIN (GLUCOPHAGE) 500 MG tablet   Oral   Take 500 mg by mouth 2 (two) times daily with a meal.         . omeprazole (PRILOSEC) 20 MG capsule   Oral   Take 20 mg by mouth 2 (two) times daily before a meal.         . Oxycodone HCl 10 MG TABS   Oral   Take 1 tablet (10 mg total) by mouth every 6 (six) hours as needed.   120 tablet   0   . Zinc 50 MG TABS   Oral   Take 1 tablet by mouth 2 (two) times daily.          BP 155/75  Pulse 102  Temp(Src) 98.3 F (36.8 C) (Oral)  Resp 18  SpO2 99% Physical Exam  Constitutional: He is oriented to person, place, and time. He appears well-developed and well-nourished. No distress.  HENT:  Head: Normocephalic and atraumatic.  Nose: Nose normal.  Mouth/Throat: Oropharynx is clear and moist.  Eyes: Conjunctivae and EOM are normal. Pupils are equal, round, and reactive to light.  Neck: Normal range of motion. Neck supple. No tracheal deviation present.  Cardiovascular: Regular rhythm, normal heart sounds and intact distal pulses.   Tachycardia   Pulmonary/Chest: Effort normal and breath sounds  normal. No respiratory distress. He has no rales.  Abdominal: Soft. Bowel sounds are normal. He exhibits no distension. There is no tenderness. There is no rebound and no guarding.  Musculoskeletal: Normal range of motion. He exhibits no edema and no tenderness.  Neurological: He is alert and oriented to person, place, and time.  Skin: Skin is warm and dry.  See pictures below   Psychiatric: He has a normal mood and affect. His behavior is normal.    ED Course  Procedures (including critical care time) Labs Review Labs Reviewed  CBC - Abnormal; Notable for the following:    WBC 11.1 (*)    RBC 3.69 (*)    Hemoglobin 10.2 (*)    HCT 30.0 (*)    All other components within normal limits  BASIC METABOLIC PANEL - Abnormal; Notable for the following:    Sodium 131 (*)    Chloride 93 (*)    Glucose, Bld 191 (*)    GFR calc non Af Amer 58 (*)    GFR calc Af Amer 67 (*)    All other components within normal limits  CBC WITH DIFFERENTIAL - Abnormal; Notable for the following:    RBC 3.37 (*)    Hemoglobin 9.2 (*)    HCT 27.4 (*)    Neutrophils Relative % 79 (*)    All other components within normal limits  CULTURE, BLOOD (ROUTINE X 2)  CULTURE, BLOOD (ROUTINE X 2)  URINE CULTURE  URINALYSIS, ROUTINE W REFLEX MICROSCOPIC  I-STAT CG4 LACTIC ACID, ED   Imaging Review Dg Os Calcis Right  01/16/2014   CLINICAL DATA:  Large open wound over the calcaneus.  EXAM: RIGHT OS CALCIS - 2+ VIEW  COMPARISON:  None.  FINDINGS: There is a large soft tissue wound along the lateral margin calcaneus. Underlying bony destructive change in the posterior and lateral calcaneus is consistent with osteomyelitis. No radiopaque foreign body is identified. Vascular calcifications are noted.  IMPRESSION: Large skin ulceration over the lateral calcaneus with underlying osteomyelitis.   Electronically Signed   By: Inge Rise M.D.   On: 01/16/2014 17:42        MDM   Final diagnoses:  Cellulitis    Osteomyelitis    79 yo M with osteomyelitis with concern for superimposed cellulitis. Patient started on vanc/zosyn after blood cultures obtained. NS bolus given for mild tachycardia.   6:23 PM Spoke with Dr. Baxter Flattery (infectious disease) who agreed with current management and plans to consult on patient tomorrow morning. Recommended in patient admission to hospitalist team.   6:58 PM Spoke with Dr. Percell Miller (orthopeadics) who will consult on patient.   Case co managed with my attending Dr. Aline Brochure.   Thorough discussion with patient on plan and findings. Patient admitted to [facility] team for further care.     Ruthell Rummage, MD 01/16/14 [phone]

## 2014-01-16 NOTE — H&P (Signed)
[facility] History and Physical  [name] AVW:[id number] DOB: 04/22/1935 DOA: 01/16/2014  Referring physician: ED physician PCP: Georgetta Haber, MD   Chief Complaint: right foot pain   HPI:  79 yo male with diabetes, CHF, atrial fibrillation on chronic anticoagulation, s/p pacemaker placement, who now presented to [facility] ED with main concern of progressively worsening right foot wound (followed by Dr. Tommy Medal - ID, Dr. Dellia Nims - wound care). Recently treated with Ciprofloxacin after found to be growing Pseudomonas in the wound. He explains that right lower extremity has been more painful over the past week, throbbing and constant, 5/10 in severity, non radiating, no specific alleviating factors, worse with movement and palpation. Pt denies fevers, chills, no specific abdominal or urinary concerns. Pt also denies chest pain or shortness of breath.   In ED, pt is hemodynamically stable. XRAY of the right foot with large skin ulceration over the lateral calcaneus with underlying osteomyelitis. Pt started on Vancomycin and Zosyn as per ID (Dr. Baxter Flattery) recommendations. Ortho team consulted for further recommendations.   Assessment and Plan: Active Problems: Osteomyelitis - appreciate ortho assistance - will follow up on recommendations - for now will admit to medical bed, continue Vancomycin and Zosyn as already started in ED  - provide supportive care with analgesia, antiemetics as needed Anemia of chronic disease  - Hg at baseline 10 -11 since 2014 - no signs of active bleeding - CBC in AM Diabetes - check A1C, continue home Lantus but will lower the dose from 25U to 10U for now  - readjust the dose as indicated - will also place on SSI  - hold metformin  Atrial fibrillation  continue Cardizem, Eliquis, Digoxin - no current cardiac concerns but close monitoring as pt may need to be monitored on telemetry if he becomes symptomatic  Chronic diastolic CHF - based on last 2  D ECHO 11/2012 - hold Lasix temporarily, place on IVF gentle hydration with NS 50 cc/hr for 8 hours - monitor daily weights and strict I's and O's - weight on admission 169 lbs  Radiological Exams on Admission: Dg Os Calcis Right   01/16/2014   Large skin ulceration over the lateral calcaneus with underlying osteomyelitis.   Code Status: Full Family Communication: Pt at bedside Disposition Plan: PT evaluation    Review of Systems:  Constitutional: Negative for fever, chills and malaise/fatigue. Negative for diaphoresis.  HENT: Negative for hearing loss, ear pain, nosebleeds, congestion, sore throat, neck pain, tinnitus and ear discharge.   Eyes: Negative for blurred vision, double vision, photophobia, pain, discharge and redness.  Respiratory: Negative for cough, hemoptysis, sputum production, shortness of breath, wheezing and stridor.   Cardiovascular: Negative for chest pain, palpitations, orthopnea, claudication and leg swelling.  Gastrointestinal: Negative for nausea, vomiting and abdominal pain. Negative for heartburn, constipation, blood in stool and melena.  Genitourinary: Negative for dysuria, urgency, frequency, hematuria and flank pain.  Musculoskeletal: Negative for myalgias, back pain, joint pain and falls.  Skin: Negative for itching and rash.  Neurological: Negative for dizziness and weakness. Negative for tingling, tremors, sensory change, speech change, focal weakness, loss of consciousness and headaches.  Endo/Heme/Allergies: Negative for environmental allergies and polydipsia. Does not bruise/bleed easily.  Psychiatric/Behavioral: Negative for suicidal ideas. The patient is not nervous/anxious.      Past Medical History  Diagnosis Date  . Fatigue   . Overweight   . Syncope and collapse   . GERD (gastroesophageal reflux disease)   . Right bundle branch block   .  Unspecified venous (peripheral) insufficiency   . Phlebitis and thrombophlebitis of superficial vessels  of lower extremities   . Cellulitis and abscess of leg, except foot   . Backache, unspecified   . Long term (current) use of anticoagulants   . Type II or unspecified type diabetes mellitus without mention of complication, not stated as uncontrolled   . Unspecified venous (peripheral) insufficiency   . Hypertension   . Gout, unspecified   . Heart failure   . Atrial fibrillation     permanent  . Esophageal stricture   . Hx of colonic polyps 05-2006    (Adenomatous)Dr. [name]  . Diverticulosis 05-2006    Dr. [name]   . Tachycardia-bradycardia     s/p PPM  . Pacemaker- [name] 02/20/2013    Past Surgical History  Procedure Laterality Date  . Laminectomy    . Lumbar fusion    . Rotator cuff repair    . Ruptured rt rectus muscle    . Eye surgery    . Pacemaker insertion  8/12    SJM by [name] for tachy/brady syndrome    Social History:  reports that he has never smoked. He has never used smokeless tobacco. He reports that he drinks alcohol. He reports that he does not use illicit drugs.  Allergies  Allergen Reactions  . Morphine Rash and Other (See Comments)    Irritability also    Family History  Problem Relation Age of Onset  . Pneumonia Mother   . Heart disease Father   . Diabetes Father   . Colon cancer Neg Hx     Prior to Admission medications   Medication Sig Start Date End Date Taking? Authorizing Provider  apixaban (ELIQUIS) 5 MG TABS tablet Take 1 tablet (5 mg total) by mouth 2 (two) times daily. 04/04/13  Yes Ricard Dillon, MD  ciprofloxacin (CIPRO) 750 MG tablet Take 1 tablet (750 mg total) by mouth 2 (two) times daily. 01/09/14  Yes Truman Hayward, MD  digoxin (LANOXIN) 0.125 MG tablet Take 0.125 mg by mouth daily.   Yes Historical Provider, MD  diltiazem (CARDIZEM CD) 180 MG 24 hr capsule Take 1 capsule (180 mg total) by mouth daily. 10/01/13  Yes Ricard Dillon, MD  furosemide (LASIX) 80 MG tablet Take 1 tablet (80 mg total) by mouth daily. 06/17/13   Yes Ricard Dillon, MD  insulin aspart (NOVOLOG) 100 UNIT/ML injection Sliding scale before meals-- if cbg is > 300 take 10 units:if 200-300 use 5 units- if < than 200 no insulin 04/30/13  Yes Marletta Lor, MD  insulin glargine (LANTUS) 100 UNIT/ML injection Inject 25 Units into the skin at bedtime.    Yes Historical Provider, MD  metFORMIN (GLUCOPHAGE) 500 MG tablet Take 500 mg by mouth 2 (two) times daily with a meal.   Yes Historical Provider, MD  omeprazole (PRILOSEC) 20 MG capsule Take 20 mg by mouth 2 (two) times daily before a meal.   Yes Historical Provider, MD  Oxycodone HCl 10 MG TABS Take 1 tablet (10 mg total) by mouth every 6 (six) hours as needed. 12/16/13  Yes Ricard Dillon, MD  Zinc 50 MG TABS Take 1 tablet by mouth 2 (two) times daily.   Yes Historical Provider, MD    Physical Exam: Filed Vitals:   01/16/14 1624 01/16/14 1630 01/16/14 1700  BP: 142/75 140/78 155/75  Pulse: 117 117 102  Temp: 98.3 F (36.8 C)    TempSrc: Oral  Resp: 18    SpO2: 98% 100% 99%    Physical Exam  Constitutional: Appears well-developed and well-nourished. No distress.  HENT: Normocephalic. External right and left ear normal. Oropharynx is clear and moist.  Eyes: Conjunctivae and EOM are normal. PERRLA, no scleral icterus.  Neck: Normal ROM. Neck supple. No JVD. No tracheal deviation. No thyromegaly.  CVS: Irregular rate and rhythm,  S1/S2 +, no murmurs, no gallops, no carotid bruit.  Pulmonary: Effort and breath sounds normal, no stridor, rhonchi, wheezes, rales.  Abdominal: Soft. BS +,  no distension, tenderness, rebound or guarding.  Musculoskeletal: Normal range of motion. Right heal wound with drainage and with associated erythema from ankle to mid shin area, TTP, slightly warm to touch Lymphadenopathy: No lymphadenopathy noted, cervical, inguinal. Neuro: Alert. Normal reflexes, muscle tone coordination. No cranial nerve deficit. Skin: Skin is warm and dry. No rash noted. Not  diaphoretic. No erythema. No pallor.  Psychiatric: Normal mood and affect. Behavior, judgment, thought content normal.   Labs on Admission:  Basic Metabolic Panel:  Recent Labs Lab 01/16/14 1623  NA 131*  K 4.7  CL 93*  CO2 26  GLUCOSE 191*  BUN 17  CREATININE 1.19  CALCIUM 9.4   CBC:  Recent Labs Lab 01/16/14 1623 01/16/14 1818  WBC 11.1* 9.0  NEUTROABS  --  7.1  HGB 10.2* 9.2*  HCT 30.0* 27.4*  MCV 81.3 81.3  PLT 296 239   CBG:  Recent Labs Lab 01/10/14 1714 01/13/14 1303 01/13/14 1541 01/15/14 1559 01/15/14 1745  GLUCAP 204* 237* 183* 224* 186*      EKG: Normal sinus rhythm, no ST/T wave changes  Faye Ramsay, MD  [facility] Pager [phone]  If 7PM-7AM, please contact night-coverage [email or web address] Password [facility] 01/16/2014, 6:56 PM

## 2014-01-16 NOTE — ED Notes (Signed)
NOTIFIED DR. HARRISON OF PATIENTS LAB RESULTS OF CG4+ LACTIC ACID  ,01/16/2014.

## 2014-01-16 NOTE — Progress Notes (Signed)
ANTIBIOTIC CONSULT NOTE - INITIAL  Pharmacy Consult for vanc/zosyn Indication: Osteo  Allergies  Allergen Reactions  . Morphine Rash    REACTION:irritability    Patient Measurements: Wt  = 77kg Adjusted Body Weight:   Vital Signs: Temp: 98.3 F (36.8 C) (02/26 1624) Temp src: Oral (02/26 1624) BP: 142/75 mmHg (02/26 1624) Pulse Rate: 117 (02/26 1624) Intake/Output from previous day:   Intake/Output from this shift:    Labs: No results found for this basename: WBC, HGB, PLT, LABCREA, CREATININE,  in the last 72 hours The CrCl is unknown because both a height and weight (above a minimum accepted value) are required for this calculation. No results found for this basename: VANCOTROUGH, VANCOPEAK, VANCORANDOM, GENTTROUGH, GENTPEAK, GENTRANDOM, TOBRATROUGH, TOBRAPEAK, TOBRARND, AMIKACINPEAK, AMIKACINTROU, AMIKACIN,  in the last 72 hours   Microbiology: No results found for this or any previous visit (from the past 720 hour(s)).  Medical History: Past Medical History  Diagnosis Date  . Fatigue   . Overweight   . Syncope and collapse   . GERD (gastroesophageal reflux disease)   . Right bundle branch block   . Unspecified venous (peripheral) insufficiency   . Phlebitis and thrombophlebitis of superficial vessels of lower extremities   . Cellulitis and abscess of leg, except foot   . Backache, unspecified   . Long term (current) use of anticoagulants   . Type II or unspecified type diabetes mellitus without mention of complication, not stated as uncontrolled   . Unspecified venous (peripheral) insufficiency   . Hypertension   . Gout, unspecified   . Heart failure   . Atrial fibrillation     permanent  . Esophageal stricture   . Hx of colonic polyps 05-2006    (Adenomatous)Dr. [name]  . Diverticulosis 05-2006    Dr. [name]   . Tachycardia-bradycardia     s/p PPM  . Pacemaker- [name] 02/20/2013    Medications:   (Not in a hospital admission) Scheduled:    Infusions:  . sodium chloride     Followed by  . sodium chloride    . piperacillin-tazobactam    . 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 Looks like he had pseudomonas in his wound culture and it was pan sens according to ID's note. Pt presented to ED with same wound but more severe. Empiric vanc and zosyn have been ordered for his osteo  Goal of Therapy:  Vancomycin trough level 15-20 mcg/ml  Plan:   Vanc 750mg  IV q12 Level at steady state Zosyn 3.375g IV q8

## 2014-01-16 NOTE — Progress Notes (Signed)
Pt refused to be inserted with foley catheter. Explained pt the importance of putting foley for strict I&Os, pt able to tell RN and tech when to use the bathroom or use the urinal to measure every output. Pt denies incontinence. Urinal placed within reached. Informed MD floor cover. Will continue to monitor.

## 2014-01-16 NOTE — Progress Notes (Signed)
[name] is an 79 y.o. male.   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  Past Medical History  Diagnosis Date  . Fatigue   . Overweight   . Syncope and collapse   . GERD (gastroesophageal reflux disease)   . Right bundle branch block   . Unspecified venous (peripheral) insufficiency   . Phlebitis and thrombophlebitis of superficial vessels of lower extremities   . Cellulitis and abscess of leg, except foot   . Backache, unspecified   . Long term (current) use of anticoagulants   . Type II or unspecified type diabetes mellitus without mention of complication, not stated as uncontrolled   . Unspecified venous (peripheral) insufficiency   . Hypertension   . Gout, unspecified   . Heart failure   . Atrial fibrillation     permanent  . Esophageal stricture   . Hx of colonic polyps 05-2006    (Adenomatous)Dr. [name]  . Diverticulosis 05-2006    Dr. [name]   . Tachycardia-bradycardia     s/p PPM  . Pacemaker- [name] 02/20/2013    Past Surgical History  Procedure Laterality Date  . Laminectomy    . Lumbar fusion    . Rotator cuff repair    . Ruptured rt rectus muscle    . Eye surgery    . Pacemaker insertion  8/12    SJM by [name] for tachy/brady syndrome    Family History  Problem Relation Age of Onset  . Pneumonia Mother   . Heart disease Father   . Diabetes Father   . Colon cancer Neg Hx    Social History:  reports that he has never smoked. He has never used smokeless tobacco. He reports that he drinks alcohol. He reports that he does not use illicit drugs.  Allergies:  Allergies  Allergen Reactions  . Morphine Rash and Other (See Comments)    Irritability also    Medications Prior to Admission  Medication Sig Dispense Refill  . apixaban (ELIQUIS) 5 MG TABS tablet Take  1 tablet (5 mg total) by mouth 2 (two) times daily.  60 tablet  11  . ciprofloxacin (CIPRO) 750 MG tablet Take 1 tablet (750 mg total) by mouth 2 (two) times daily.  60 tablet  4  . digoxin (LANOXIN) 0.125 MG tablet Take 0.125 mg by mouth daily.      Marland Kitchen diltiazem (CARDIZEM CD) 180 MG 24 hr capsule Take 1 capsule (180 mg total) by mouth daily.  30 capsule  6  . furosemide (LASIX) 80 MG tablet Take 1 tablet (80 mg total) by mouth daily.  30 tablet  3  . insulin aspart (NOVOLOG) 100 UNIT/ML injection Sliding scale before meals-- if cbg is > 300 take 10 units:if 200-300 use 5 units- if < than 200 no insulin  1 vial  5  . insulin glargine (LANTUS) 100 UNIT/ML injection Inject 25 Units into the skin at bedtime.       . metFORMIN (GLUCOPHAGE) 500 MG tablet Take 500 mg by mouth 2 (two) times daily with a meal.      . omeprazole (PRILOSEC) 20 MG capsule Take 20 mg by mouth 2 (two) times daily before a meal.      . Oxycodone HCl 10 MG TABS Take 1 tablet (10 mg total) by mouth every 6 (six) hours as  needed.  120 tablet  0  . Zinc 50 MG TABS Take 1 tablet by mouth 2 (two) times daily.        Results for orders placed during the hospital encounter of 01/16/14 (from the past 48 hour(s))  CBC     Status: Abnormal   Collection Time    01/16/14  4:23 PM      Result Value Ref Range   WBC 11.1 (*) 4.0 - 10.5 K/uL   RBC 3.69 (*) 4.22 - 5.81 MIL/uL   Hemoglobin 10.2 (*) 13.0 - 17.0 g/dL   HCT 30.0 (*) 39.0 - 52.0 %   MCV 81.3  78.0 - 100.0 fL   MCH 27.6  26.0 - 34.0 pg   MCHC 34.0  30.0 - 36.0 g/dL   RDW 13.7  11.5 - 15.5 %   Platelets 296  150 - 400 K/uL  BASIC METABOLIC PANEL     Status: Abnormal   Collection Time    01/16/14  4:23 PM      Result Value Ref Range   Sodium 131 (*) 137 - 147 mEq/L   Potassium 4.7  3.7 - 5.3 mEq/L   Chloride 93 (*) 96 - 112 mEq/L   CO2 26  19 - 32 mEq/L   Glucose, Bld 191 (*) 70 - 99 mg/dL   BUN 17  6 - 23 mg/dL   Creatinine, Ser 1.19  0.50 - 1.35 mg/dL   Calcium 9.4   8.4 - 10.5 mg/dL   GFR calc non Af Amer 58 (*) >90 mL/min   GFR calc Af Amer 67 (*) >90 mL/min   Comment: (NOTE)     The eGFR has been calculated using the CKD EPI equation.     This calculation has not been validated in all clinical situations.     eGFR's persistently <90 mL/min signify possible Chronic Kidney     Disease.  I-STAT CG4 LACTIC ACID, ED     Status: None   Collection Time    01/16/14  5:07 PM      Result Value Ref Range   Lactic Acid, Venous 1.62  0.5 - 2.2 mmol/L  CBC WITH DIFFERENTIAL     Status: Abnormal   Collection Time    01/16/14  6:18 PM      Result Value Ref Range   WBC 9.0  4.0 - 10.5 K/uL   RBC 3.37 (*) 4.22 - 5.81 MIL/uL   Hemoglobin 9.2 (*) 13.0 - 17.0 g/dL   HCT 27.4 (*) 39.0 - 52.0 %   MCV 81.3  78.0 - 100.0 fL   MCH 27.3  26.0 - 34.0 pg   MCHC 33.6  30.0 - 36.0 g/dL   RDW 13.8  11.5 - 15.5 %   Platelets 239  150 - 400 K/uL   Neutrophils Relative % 79 (*) 43 - 77 %   Neutro Abs 7.1  1.7 - 7.7 K/uL   Lymphocytes Relative 13  12 - 46 %   Lymphs Abs 1.2  0.7 - 4.0 K/uL   Monocytes Relative 7  3 - 12 %   Monocytes Absolute 0.6  0.1 - 1.0 K/uL   Eosinophils Relative 1  0 - 5 %   Eosinophils Absolute 0.1  0.0 - 0.7 K/uL   Basophils Relative 0  0 - 1 %   Basophils Absolute 0.0  0.0 - 0.1 K/uL  GLUCOSE, CAPILLARY     Status: Abnormal   Collection Time    01/16/14  8:16 PM      Result Value Ref Range   Glucose-Capillary 158 (*) 70 - 99 mg/dL  URINALYSIS, ROUTINE W REFLEX MICROSCOPIC     Status: Abnormal   Collection Time    01/16/14  8:42 PM      Result Value Ref Range   Color, Urine YELLOW  YELLOW   APPearance CLEAR  CLEAR   Specific Gravity, Urine 1.018  1.005 - 1.030   pH 7.0  5.0 - 8.0   Glucose, UA 100 (*) NEGATIVE mg/dL   Hgb urine dipstick SMALL (*) NEGATIVE   Bilirubin Urine NEGATIVE  NEGATIVE   Ketones, ur NEGATIVE  NEGATIVE mg/dL   Protein, ur >300 (*) NEGATIVE mg/dL   Urobilinogen, UA 0.2  0.0 - 1.0 mg/dL   Nitrite NEGATIVE   NEGATIVE   Leukocytes, UA NEGATIVE  NEGATIVE  URINE MICROSCOPIC-ADD ON     Status: None   Collection Time    01/16/14  8:42 PM      Result Value Ref Range   WBC, UA 0-2  <3 WBC/hpf   RBC / HPF 3-6  <3 RBC/hpf   Bacteria, UA RARE  RARE  PRO B NATRIURETIC PEPTIDE     Status: Abnormal   Collection Time    01/16/14  8:43 PM      Result Value Ref Range   Pro B Natriuretic peptide (BNP) 3148.0 (*) 0 - 450 pg/mL   Dg Os Calcis Right  01/16/2014   CLINICAL DATA:  Large open wound over the calcaneus.  EXAM: RIGHT OS CALCIS - 2+ VIEW  COMPARISON:  None.  FINDINGS: There is a large soft tissue wound along the lateral margin calcaneus. Underlying bony destructive change in the posterior and lateral calcaneus is consistent with osteomyelitis. No radiopaque foreign body is identified. Vascular calcifications are noted.  IMPRESSION: Large skin ulceration over the lateral calcaneus with underlying osteomyelitis.   Electronically Signed   By: Inge Rise M.D.   On: 01/16/2014 17:42    ROS  Blood pressure 188/97, pulse 117, temperature 98.9 F (37.2 C), temperature source Oral, resp. rate 20, weight 79.6 kg (175 lb 7.8 oz), SpO2 99.00%. Physical Exam   Assessment/Plan Pt arrived on floor. Alert and oriented. Able to make needs known. Wife at bedside. Oriented to room and staff. Call light place within reached. Will continue to monitor.   Mina Marble 01/16/2014, 10:16 PM

## 2014-01-16 NOTE — Progress Notes (Signed)
Pt report given by Jacqlyn Larsen, RN. Awaiting pt arrival to floor.

## 2014-01-16 NOTE — ED Notes (Signed)
PCP has been treating a wound to patients R outer ankle for past several months. He states this am the pain in the wound was more severe and he had a foul smell to the wound which he had not noticed before today. His pcp recommended him to come here today for further management of the wound.

## 2014-01-17 ENCOUNTER — Encounter (HOSPITAL_COMMUNITY): Payer: Self-pay | Admitting: *Deleted

## 2014-01-17 ENCOUNTER — Encounter (HOSPITAL_COMMUNITY)
Admission: EM | Disposition: A | Discharge status: Skilled Nursing Facility | Payer: Self-pay | Source: Home / Self Care | Attending: Internal Medicine

## 2014-01-17 ENCOUNTER — Encounter (HOSPITAL_COMMUNITY): Payer: Medicare Other | Admitting: Anesthesiology

## 2014-01-17 ENCOUNTER — Inpatient Hospital Stay (HOSPITAL_COMMUNITY): Payer: Medicare Other | Admitting: Anesthesiology

## 2014-01-17 DIAGNOSIS — L97409 Non-pressure chronic ulcer of unspecified heel and midfoot with unspecified severity: Secondary | ICD-10-CM | POA: Diagnosis present

## 2014-01-17 DIAGNOSIS — I4891 Unspecified atrial fibrillation: Secondary | ICD-10-CM

## 2014-01-17 DIAGNOSIS — I509 Heart failure, unspecified: Secondary | ICD-10-CM

## 2014-01-17 DIAGNOSIS — E119 Type 2 diabetes mellitus without complications: Secondary | ICD-10-CM

## 2014-01-17 HISTORY — PX: I&D EXTREMITY: SHX5045

## 2014-01-17 LAB — BASIC METABOLIC PANEL
BUN: 16 mg/dL (ref 6–23)
CO2: 23 mEq/L (ref 19–32)
CREATININE: 1.12 mg/dL (ref 0.50–1.35)
Calcium: 8.6 mg/dL (ref 8.4–10.5)
Chloride: 97 mEq/L (ref 96–112)
GFR calc non Af Amer: 62 mL/min — ABNORMAL LOW (ref >90)
GFR, EST AFRICAN AMERICAN: 72 mL/min — AB (ref >90)
Glucose, Bld: 142 mg/dL — ABNORMAL HIGH (ref 70–99)
Potassium: 4.2 mEq/L (ref 3.7–5.3)
Sodium: 132 mEq/L — ABNORMAL LOW (ref 137–147)

## 2014-01-17 LAB — GLUCOSE, CAPILLARY
GLUCOSE-CAPILLARY: 109 mg/dL — AB (ref 70–99)
GLUCOSE-CAPILLARY: 293 mg/dL — AB (ref 70–99)
Glucose-Capillary: 119 mg/dL — ABNORMAL HIGH (ref 70–99)
Glucose-Capillary: 102 mg/dL — ABNORMAL HIGH (ref 70–99)
Glucose-Capillary: 187 mg/dL — ABNORMAL HIGH (ref 70–99)

## 2014-01-17 LAB — SURGICAL PCR SCREEN
MRSA, PCR: NEGATIVE
STAPHYLOCOCCUS AUREUS: NEGATIVE

## 2014-01-17 LAB — URINE CULTURE: Colony Count: 65000

## 2014-01-17 LAB — CBC
HCT: 25.5 % — ABNORMAL LOW (ref 39.0–52.0)
Hemoglobin: 8.6 g/dL — ABNORMAL LOW (ref 13.0–17.0)
MCH: 27.4 pg (ref 26.0–34.0)
MCHC: 33.7 g/dL (ref 30.0–36.0)
MCV: 81.2 fL (ref 78.0–100.0)
PLATELETS: 218 10*3/uL (ref 150–400)
RBC: 3.14 MIL/uL — ABNORMAL LOW (ref 4.22–5.81)
RDW: 13.9 % (ref 11.5–15.5)
WBC: 7.9 10*3/uL (ref 4.0–10.5)

## 2014-01-17 LAB — HEMOGLOBIN A1C
HEMOGLOBIN A1C: 7.9 % — AB (ref <5.7)
Mean Plasma Glucose: 180 mg/dL — ABNORMAL HIGH (ref <117)

## 2014-01-17 LAB — MRSA PCR SCREENING: MRSA by PCR: NEGATIVE

## 2014-01-17 SURGERY — IRRIGATION AND DEBRIDEMENT EXTREMITY
Anesthesia: General | Site: Foot | Laterality: Right

## 2014-01-17 MED ORDER — OXYCODONE HCL 5 MG PO TABS
5.0000 mg | ORAL_TABLET | Freq: Once | ORAL | Status: AC | PRN
  Administered 2014-01-17: 5 mg via ORAL

## 2014-01-17 MED ORDER — VANCOMYCIN HCL IN DEXTROSE 750-5 MG/150ML-% IV SOLN
750.0000 mg | Freq: Two times a day (BID) | INTRAVENOUS | Status: DC
  Administered 2014-01-17 – 2014-01-19 (×4): 750 mg via INTRAVENOUS
  Filled 2014-01-17 (×5): qty 150

## 2014-01-17 MED ORDER — LACTATED RINGERS IV SOLN
INTRAVENOUS | Status: DC | PRN
  Administered 2014-01-17: 11:00:00 via INTRAVENOUS

## 2014-01-17 MED ORDER — FUROSEMIDE 40 MG PO TABS
40.0000 mg | ORAL_TABLET | Freq: Every day | ORAL | Status: DC
  Administered 2014-01-20 – 2014-01-24 (×5): 40 mg via ORAL
  Filled 2014-01-17 (×8): qty 1

## 2014-01-17 MED ORDER — ONDANSETRON HCL 4 MG/2ML IJ SOLN
INTRAMUSCULAR | Status: AC
  Filled 2014-01-17: qty 2

## 2014-01-17 MED ORDER — ONDANSETRON HCL 4 MG/2ML IJ SOLN
INTRAMUSCULAR | Status: DC | PRN
  Administered 2014-01-17: 4 mg via INTRAVENOUS

## 2014-01-17 MED ORDER — FENTANYL CITRATE 0.05 MG/ML IJ SOLN
INTRAMUSCULAR | Status: DC | PRN
  Administered 2014-01-17: 25 ug via INTRAVENOUS
  Administered 2014-01-17: 50 ug via INTRAVENOUS

## 2014-01-17 MED ORDER — PROPOFOL 10 MG/ML IV BOLUS
INTRAVENOUS | Status: DC | PRN
  Administered 2014-01-17: 130 mg via INTRAVENOUS

## 2014-01-17 MED ORDER — PIPERACILLIN-TAZOBACTAM 3.375 G IVPB
3.3750 g | Freq: Three times a day (TID) | INTRAVENOUS | Status: DC
Start: 2014-01-17 — End: 2014-01-20
  Administered 2014-01-17 – 2014-01-20 (×9): 3.375 g via INTRAVENOUS
  Filled 2014-01-17 (×12): qty 50

## 2014-01-17 MED ORDER — SODIUM CHLORIDE 0.9 % IR SOLN
Status: DC | PRN
  Administered 2014-01-17: 12:00:00

## 2014-01-17 MED ORDER — PIPERACILLIN-TAZOBACTAM 3.375 G IVPB 30 MIN
3.3750 g | Freq: Once | INTRAVENOUS | Status: DC
  Filled 2014-01-17: qty 50

## 2014-01-17 MED ORDER — PIPERACILLIN-TAZOBACTAM 3.375 G IVPB 30 MIN
INTRAVENOUS | Status: DC | PRN
  Administered 2014-01-17: 3.375 g via INTRAVENOUS

## 2014-01-17 MED ORDER — LIDOCAINE HCL (CARDIAC) 20 MG/ML IV SOLN
INTRAVENOUS | Status: AC
  Filled 2014-01-17: qty 5

## 2014-01-17 MED ORDER — FENTANYL CITRATE 0.05 MG/ML IJ SOLN
25.0000 ug | INTRAMUSCULAR | Status: DC | PRN
  Administered 2014-01-17 (×2): 50 ug via INTRAVENOUS

## 2014-01-17 MED ORDER — FENTANYL CITRATE 0.05 MG/ML IJ SOLN
INTRAMUSCULAR | Status: AC
  Filled 2014-01-17: qty 5

## 2014-01-17 MED ORDER — ARTIFICIAL TEARS OP OINT
TOPICAL_OINTMENT | OPHTHALMIC | Status: DC | PRN
  Administered 2014-01-17: 1 via OPHTHALMIC

## 2014-01-17 MED ORDER — APIXABAN 5 MG PO TABS
5.0000 mg | ORAL_TABLET | Freq: Two times a day (BID) | ORAL | Status: DC
  Administered 2014-01-17 – 2014-01-18 (×3): 5 mg via ORAL
  Filled 2014-01-17 (×5): qty 1

## 2014-01-17 MED ORDER — INSULIN ASPART 100 UNIT/ML ~~LOC~~ SOLN
0.0000 [IU] | Freq: Every day | SUBCUTANEOUS | Status: DC
  Administered 2014-01-17: 3 [IU] via SUBCUTANEOUS
  Administered 2014-01-19 – 2014-01-22 (×2): 2 [IU] via SUBCUTANEOUS

## 2014-01-17 MED ORDER — OXYCODONE HCL 5 MG/5ML PO SOLN: 5.0000 mg | Freq: Once | ORAL | Status: AC | PRN

## 2014-01-17 MED ORDER — SODIUM CHLORIDE 0.9 % IR SOLN
Status: DC | PRN
  Administered 2014-01-17: 1000 mL

## 2014-01-17 MED ORDER — SODIUM CHLORIDE 0.9 % IV SOLN
INTRAVENOUS | Status: AC
  Administered 2014-01-17: 22:00:00 via INTRAVENOUS

## 2014-01-17 MED ORDER — OXYCODONE HCL 5 MG PO TABS
ORAL_TABLET | ORAL | Status: AC
  Filled 2014-01-17: qty 1

## 2014-01-17 MED ORDER — ONDANSETRON HCL 4 MG/2ML IJ SOLN: 4.0000 mg | Freq: Four times a day (QID) | INTRAMUSCULAR | Status: DC | PRN

## 2014-01-17 MED ORDER — LACTATED RINGERS IV SOLN: INTRAVENOUS | Status: DC

## 2014-01-17 MED ORDER — LIDOCAINE HCL (CARDIAC) 20 MG/ML IV SOLN
INTRAVENOUS | Status: DC | PRN
  Administered 2014-01-17: 50 mg via INTRAVENOUS

## 2014-01-17 MED ORDER — FENTANYL CITRATE 0.05 MG/ML IJ SOLN
INTRAMUSCULAR | Status: AC
  Filled 2014-01-17: qty 2

## 2014-01-17 MED ORDER — ROCURONIUM BROMIDE 50 MG/5ML IV SOLN
INTRAVENOUS | Status: AC
  Filled 2014-01-17: qty 1

## 2014-01-17 MED ORDER — ZOLPIDEM TARTRATE 5 MG PO TABS
5.0000 mg | ORAL_TABLET | Freq: Once | ORAL | Status: AC
  Administered 2014-01-17: 5 mg via ORAL
  Filled 2014-01-17: qty 1

## 2014-01-17 MED ORDER — INSULIN ASPART 100 UNIT/ML ~~LOC~~ SOLN
0.0000 [IU] | Freq: Three times a day (TID) | SUBCUTANEOUS | Status: DC
  Administered 2014-01-18: 3 [IU] via SUBCUTANEOUS
  Administered 2014-01-18: 2 [IU] via SUBCUTANEOUS
  Administered 2014-01-18 – 2014-01-19 (×2): 3 [IU] via SUBCUTANEOUS
  Administered 2014-01-19 – 2014-01-20 (×4): 2 [IU] via SUBCUTANEOUS
  Administered 2014-01-21 (×2): 3 [IU] via SUBCUTANEOUS
  Administered 2014-01-22: 1 [IU] via SUBCUTANEOUS
  Administered 2014-01-22 – 2014-01-23 (×4): 3 [IU] via SUBCUTANEOUS
  Administered 2014-01-23: 1 [IU] via SUBCUTANEOUS
  Administered 2014-01-24 (×2): 2 [IU] via SUBCUTANEOUS

## 2014-01-17 MED ORDER — PROPOFOL 10 MG/ML IV BOLUS
INTRAVENOUS | Status: AC
  Filled 2014-01-17: qty 20

## 2014-01-17 SURGICAL SUPPLY — 47 items
BAG DECANTER FOR FLEXI CONT (MISCELLANEOUS) ×3 IMPLANT
BANDAGE ELASTIC 4 VELCRO ST LF (GAUZE/BANDAGES/DRESSINGS) IMPLANT
BANDAGE GAUZE ELAST BULKY 4 IN (GAUZE/BANDAGES/DRESSINGS) ×3 IMPLANT
BLADE SURG ROTATE 9660 (MISCELLANEOUS) IMPLANT
CANISTER SUCTION 2500CC (MISCELLANEOUS) IMPLANT
CANISTER WOUND CARE 500ML ATS (WOUND CARE) ×3 IMPLANT
CHLORAPREP W/TINT 26ML (MISCELLANEOUS) IMPLANT
CLOTH BEACON ORANGE TIMEOUT ST (SAFETY) IMPLANT
CONT SPEC STER OR (MISCELLANEOUS) ×3 IMPLANT
COVER SURGICAL LIGHT HANDLE (MISCELLANEOUS) ×3 IMPLANT
DRAPE EXTREMITY T 121X128X90 (DRAPE) IMPLANT
DRAPE INCISE IOBAN 66X45 STRL (DRAPES) ×3 IMPLANT
DRAPE ORTHO SPLIT 77X108 STRL (DRAPES)
DRAPE SURG ORHT 6 SPLT 77X108 (DRAPES) IMPLANT
DRSG ADAPTIC 3X8 NADH LF (GAUZE/BANDAGES/DRESSINGS) ×3 IMPLANT
DRSG PAD ABDOMINAL 8X10 ST (GAUZE/BANDAGES/DRESSINGS) ×3 IMPLANT
DRSG VAC ATS SM SENSATRAC (GAUZE/BANDAGES/DRESSINGS) ×3 IMPLANT
ELECT REM PT RETURN 9FT ADLT (ELECTROSURGICAL) ×3
ELECTRODE REM PT RTRN 9FT ADLT (ELECTROSURGICAL) ×1 IMPLANT
GLOVE BIO SURGEON STRL SZ 6.5 (GLOVE) ×2 IMPLANT
GLOVE BIO SURGEONS STRL SZ 6.5 (GLOVE) ×1
GLOVE BIOGEL PI IND STRL 7.0 (GLOVE) ×1 IMPLANT
GLOVE BIOGEL PI INDICATOR 7.0 (GLOVE) ×2
GLOVE ORTHOPEDIC STR SZ6.5 (GLOVE) ×3 IMPLANT
GLOVE SURG ORTHO 8.0 STRL STRW (GLOVE) ×3 IMPLANT
GLOVE SURG SS PI 6.5 STRL IVOR (GLOVE) ×3 IMPLANT
GOWN STRL NON-REIN LRG LVL3 (GOWN DISPOSABLE) ×6 IMPLANT
HANDPIECE INTERPULSE COAX TIP (DISPOSABLE)
KIT BASIN OR (CUSTOM PROCEDURE TRAY) ×3 IMPLANT
KIT ROOM TURNOVER OR (KITS) ×3 IMPLANT
MANIFOLD NEPTUNE II (INSTRUMENTS) ×3 IMPLANT
MATRIX SURGICAL PSM 7X10CM (Tissue) ×3 IMPLANT
MICROMATRIX 1000MG (Tissue) ×3 IMPLANT
NS IRRIG 1000ML POUR BTL (IV SOLUTION) ×3 IMPLANT
PACK GENERAL/GYN (CUSTOM PROCEDURE TRAY) IMPLANT
PACK ORTHO EXTREMITY (CUSTOM PROCEDURE TRAY) ×3 IMPLANT
PAD ARMBOARD 7.5X6 YLW CONV (MISCELLANEOUS) ×3 IMPLANT
SET HNDPC FAN SPRY TIP SCT (DISPOSABLE) IMPLANT
SOLUTION PARTIC MCRMTRX 1000MG (Tissue) ×1 IMPLANT
SPONGE GAUZE 4X4 12PLY (GAUZE/BANDAGES/DRESSINGS) IMPLANT
TOWEL OR 17X24 6PK STRL BLUE (TOWEL DISPOSABLE) ×3 IMPLANT
TOWEL OR 17X26 10 PK STRL BLUE (TOWEL DISPOSABLE) ×3 IMPLANT
TUBE CONNECTING 12'X1/4 (SUCTIONS) ×1
TUBE CONNECTING 12X1/4 (SUCTIONS) ×2 IMPLANT
UNDERPAD 30X30 INCONTINENT (UNDERPADS AND DIAPERS) ×3 IMPLANT
WATER STERILE IRR 1000ML POUR (IV SOLUTION) IMPLANT
YANKAUER SUCT BULB TIP NO VENT (SUCTIONS) ×3 IMPLANT

## 2014-01-17 NOTE — Progress Notes (Signed)
Utilization review completed. Lariah Fleer, RN, BSN. 

## 2014-01-17 NOTE — ED Provider Notes (Signed)
Medical screening examination/treatment/procedure(s) were conducted as a shared visit with resident physician and myself.  I personally evaluated the patient during the encounter.  I interviewed and examined the patient. Lungs are CTAB. Cardiac exam wnl. Abdomen soft.  Pt being treated as outpt for osteomyelitis here w/ tachycardia as well as worsening pain, redness, and new foul odor. Plan for admissions. Resident discussed case w/ ID, Ortho, and triad hospitalist.   Blanchard Kelch, MD 01/17/14 [phone]

## 2014-01-17 NOTE — Care Management Note (Signed)
    Page 1 of 2   01/24/2014     3:30:29 PM   CARE MANAGEMENT NOTE 01/24/2014  Patient:  [name], [name]   Account Number:  [id number]  Date Initiated:  01/17/2014  Documentation initiated by:  Tomi Bamberger  Subjective/Objective Assessment:   dx osteomyuelitis r ankle  admit- lives with spouse     Action/Plan:   3/2- Amputation on Monday   Anticipated DC Date:  01/24/2014   Anticipated DC Plan:  SKILLED NURSING FACILITY  In-house referral  Clinical Social Worker      DC Planning Services  CM consult      Choice offered to / List presented to:  C-1 Patient           Status of service:  Completed, signed off Medicare Important Message given?   (If response is "NO", the following Medicare IM given date fields will be blank) Date Medicare IM given:   Date Additional Medicare IM given:    Discharge Disposition:  [place]  Per UR Regulation:  Reviewed for med. necessity/level of care/duration of stay  If discussed at [place] of Stay Meetings, dates discussed:   01/21/2014    Comments:  01/22/14 [place], BSN [phone] patient is having episodes of bleeding from site, was transfused one unit prbc and eliquis dc'd.  Plan is for snf 3/5 to make sure bleeding is under control.  01/21/14 [place], BSN [phone] patient s/p R BKA, patient dressing with large amt of blood, MD notified by RN, eliques dc'd.  Plan is for SNF when medically stable.  01/17/14 [place], BSN [phone] patient lives with spouse, patient will need Cross wound vac per MD, NCM contacted Rickie with [employer], faxed information over to [place] , op note , demographics, h and p , confirmed she recieved it.  NCM called Dr. Migdalia Dk, faxed script over for her to sign and date and fax back to me, have not received yet.  When I receive the script, I will fax this to [place] as well.  Patient chose [facility] for [facility], referral made to [facility], Butch Penny notified.  Soc will begin 24-48 hrs  post discharge.  Patient will need to receive the vac before he is discharged, it will have to go through process of getting approved which [employer] is working on.  1630 patient was speaking with MD and has just decided to go with amputation on Monday with Dr. Sharol Given, NCM was speaking with Dr. Migdalia Dk and informed her of this information and left message for Rickie with [employer].  Canceled referral for [facility] with [place].

## 2014-01-17 NOTE — Op Note (Signed)
Operative Note   DATE OF OPERATION: 01/17/2014  LOCATION: [facility]  SURGICAL DIVISION: Plastic Surgery  PREOPERATIVE DIAGNOSES: Right heel ulcer  POSTOPERATIVE DIAGNOSES:  same  PROCEDURE:  Preparation of right heel ulcer with irrigation and debridement for placement of Acell (powder 1 gm and sheet 5 x 7 cm) and the VAC (4 x 4 x 3 cm)  SURGEON: Claire Sanger, DO  ASSISTANT: none  ANESTHESIA:  General.   COMPLICATIONS: None.   INDICATIONS FOR PROCEDURE:  The patient, [name], is a 79 y.o. male born on 1935/10/24, is here for treatment of a right heel ulcer.   CONSENT:  Informed consent was obtained directly from the patient. Risks, benefits and alternatives were fully discussed. Specific risks including but not limited to bleeding, infection, hematoma, seroma, scarring, pain, infection, asymmetry, wound healing problems, and need for further surgery were all discussed. The patient did have an ample opportunity to have questions answered to satisfaction.   DESCRIPTION OF PROCEDURE:  The patient was taken to the operating room. General anesthesia was administered. A SCD was placed.  The operative site was prepped and draped in a sterile fashion.  A time out was performed and all information was confirmed to be correct.  The right heel was debrided sharply with a #10 blade and tenotomies.  The area was then irrigated with antibiotic solution.  The bovie was used to obtain hemostasis.  The bone was loose and removed from the calceneous.  This was sent for cultures. The Acell powder was placed in the wound followed by a sheet.  The VAC was then cut to size and placed in the wound.  [name] was applied and an excellent seal was obtained at 125 mmHg pressure.  The patient tolerated the procedure well.  There were no complications.   The patient was allowed to wake from anesthesia and taken to the recovery room in satisfactory condition.

## 2014-01-17 NOTE — Progress Notes (Signed)
Report given to robin roberts rn as caregiver 

## 2014-01-17 NOTE — Anesthesia Procedure Notes (Signed)
Procedure Name: LMA Insertion Date/Time: 01/17/2014 12:00 PM Performed by: Erik Obey Pre-anesthesia Checklist: Patient identified, Patient being monitored, Emergency Drugs available, Timeout performed and Suction available Patient Re-evaluated:Patient Re-evaluated prior to inductionOxygen Delivery Method: Circle system utilized Preoxygenation: Pre-oxygenation with 100% oxygen Intubation Type: IV induction LMA: LMA inserted LMA Size: 5.0 Number of attempts: 1 Placement Confirmation: breath sounds checked- equal and bilateral and positive ETCO2 Tube secured with: Tape Dental Injury: Teeth and Oropharynx as per pre-operative assessment

## 2014-01-17 NOTE — Anesthesia Postprocedure Evaluation (Signed)
Anesthesia Post Note  Patient: [name]  Procedure(s) Performed: Procedure(s) (LRB): IRRIGATION AND DEBRIDEMENT RIGHT HEEL  WITH CULTURES AND BONE BIOPSY, placement of wound vac (Right)  Anesthesia type: General  Patient location: PACU  Post pain: Pain level controlled and Adequate analgesia  Post assessment: Post-op Vital signs reviewed, Patient's Cardiovascular Status Stable, Respiratory Function Stable, Patent Airway and Pain level controlled  Last Vitals:  Filed Vitals:   01/17/14 1300  BP: 122/58  Pulse: 70  Temp:   Resp: 16    Post vital signs: Reviewed and stable  Level of consciousness: awake, alert  and oriented  Complications: No apparent anesthesia complications

## 2014-01-17 NOTE — H&P (Signed)
[name] is an 79 y.o. male.   Chief Complaint: right heel ulcer HPI: The patient is a 79 yrs old wm here for treatment of a right heel ulcer.  He has been seen by several physicians who have recommended that he undergo an amputation.  This is the safest course of action considering his other medical issues and heart issues.  He has not wanted an amputation and has requested that one more try be made at salvaging his foot.  The majority of the heel is involved and it tracks to the bone.  This has been going on for months and not getting better.  Films have indicated osteo as well.   Past Medical History  Diagnosis Date  . Fatigue   . Overweight   . Syncope and collapse   . GERD (gastroesophageal reflux disease)   . Right bundle branch block   . Unspecified venous (peripheral) insufficiency   . Phlebitis and thrombophlebitis of superficial vessels of lower extremities   . Cellulitis and abscess of leg, except foot   . Backache, unspecified   . Long term (current) use of anticoagulants   . Type II or unspecified type diabetes mellitus without mention of complication, not stated as uncontrolled   . Unspecified venous (peripheral) insufficiency   . Hypertension   . Gout, unspecified   . Heart failure   . Atrial fibrillation     permanent  . Esophageal stricture   . Hx of colonic polyps 05-2006    (Adenomatous)Dr. [name]  . Diverticulosis 05-2006    Dr. [name]   . Tachycardia-bradycardia     s/p PPM  . Pacemaker- [name] 02/20/2013    Past Surgical History  Procedure Laterality Date  . Laminectomy    . Lumbar fusion    . Rotator cuff repair    . Ruptured rt rectus muscle    . Eye surgery    . Pacemaker insertion  8/12    SJM by [name] for tachy/brady syndrome    Family History  Problem Relation Age of Onset  . Pneumonia Mother   . Heart disease Father   . Diabetes Father   . Colon cancer Neg Hx    Social History:  reports that he has never smoked. He has never  used smokeless tobacco. He reports that he drinks alcohol. He reports that he does not use illicit drugs.  Allergies:  Allergies  Allergen Reactions  . Morphine Rash and Other (See Comments)    Irritability also    Medications Prior to Admission  Medication Sig Dispense Refill  . apixaban (ELIQUIS) 5 MG TABS tablet Take 1 tablet (5 mg total) by mouth 2 (two) times daily.  60 tablet  11  . ciprofloxacin (CIPRO) 750 MG tablet Take 1 tablet (750 mg total) by mouth 2 (two) times daily.  60 tablet  4  . digoxin (LANOXIN) 0.125 MG tablet Take 0.125 mg by mouth daily.      Marland Kitchen diltiazem (CARDIZEM CD) 180 MG 24 hr capsule Take 1 capsule (180 mg total) by mouth daily.  30 capsule  6  . furosemide (LASIX) 80 MG tablet Take 1 tablet (80 mg total) by mouth daily.  30 tablet  3  . insulin aspart (NOVOLOG) 100 UNIT/ML injection Sliding scale before meals-- if cbg is > 300 take 10 units:if 200-300 use 5 units- if < than 200 no insulin  1 vial  5  . insulin glargine (LANTUS) 100 UNIT/ML injection Inject 25 Units  into the skin at bedtime.       . metFORMIN (GLUCOPHAGE) 500 MG tablet Take 500 mg by mouth 2 (two) times daily with a meal.      . omeprazole (PRILOSEC) 20 MG capsule Take 20 mg by mouth 2 (two) times daily before a meal.      . Oxycodone HCl 10 MG TABS Take 1 tablet (10 mg total) by mouth every 6 (six) hours as needed.  120 tablet  0  . Zinc 50 MG TABS Take 1 tablet by mouth 2 (two) times daily.        Results for orders placed during the hospital encounter of 01/16/14 (from the past 48 hour(s))  CBC     Status: Abnormal   Collection Time    01/16/14  4:23 PM      Result Value Ref Range   WBC 11.1 (*) 4.0 - 10.5 K/uL   RBC 3.69 (*) 4.22 - 5.81 MIL/uL   Hemoglobin 10.2 (*) 13.0 - 17.0 g/dL   HCT 30.0 (*) 39.0 - 52.0 %   MCV 81.3  78.0 - 100.0 fL   MCH 27.6  26.0 - 34.0 pg   MCHC 34.0  30.0 - 36.0 g/dL   RDW 13.7  11.5 - 15.5 %   Platelets 296  150 - 400 K/uL  BASIC METABOLIC PANEL      Status: Abnormal   Collection Time    01/16/14  4:23 PM      Result Value Ref Range   Sodium 131 (*) 137 - 147 mEq/L   Potassium 4.7  3.7 - 5.3 mEq/L   Chloride 93 (*) 96 - 112 mEq/L   CO2 26  19 - 32 mEq/L   Glucose, Bld 191 (*) 70 - 99 mg/dL   BUN 17  6 - 23 mg/dL   Creatinine, Ser 1.19  0.50 - 1.35 mg/dL   Calcium 9.4  8.4 - 10.5 mg/dL   GFR calc non Af Amer 58 (*) >90 mL/min   GFR calc Af Amer 67 (*) >90 mL/min   Comment: (NOTE)     The eGFR has been calculated using the CKD EPI equation.     This calculation has not been validated in all clinical situations.     eGFR's persistently <90 mL/min signify possible Chronic Kidney     Disease.  CULTURE, BLOOD (ROUTINE X 2)     Status: None   Collection Time    01/16/14  4:44 PM      Result Value Ref Range   Specimen Description BLOOD ARM LEFT     Special Requests BOTTLES DRAWN AEROBIC AND ANAEROBIC 5CC     Culture  Setup Time       Value: 01/16/2014 22:48     Performed at [facility]   Culture       Value:        BLOOD CULTURE RECEIVED NO GROWTH TO DATE CULTURE WILL BE HELD FOR 5 DAYS BEFORE ISSUING A FINAL NEGATIVE REPORT     Performed at [facility]   Report Status PENDING    CULTURE, BLOOD (ROUTINE X 2)     Status: None   Collection Time    01/16/14  4:45 PM      Result Value Ref Range   Specimen Description BLOOD ARM RIGHT     Special Requests BOTTLES DRAWN AEROBIC AND ANAEROBIC 10CC     Culture  Setup Time       Value: 01/16/2014  22:48     Performed at [facility]   Culture       Value:        BLOOD CULTURE RECEIVED NO GROWTH TO DATE CULTURE WILL BE HELD FOR 5 DAYS BEFORE ISSUING A FINAL NEGATIVE REPORT     Performed at [facility]   Report Status PENDING    I-STAT CG4 LACTIC ACID, ED     Status: None   Collection Time    01/16/14  5:07 PM      Result Value Ref Range   Lactic Acid, Venous 1.62  0.5 - 2.2 mmol/L  CBC WITH DIFFERENTIAL     Status: Abnormal   Collection Time     01/16/14  6:18 PM      Result Value Ref Range   WBC 9.0  4.0 - 10.5 K/uL   RBC 3.37 (*) 4.22 - 5.81 MIL/uL   Hemoglobin 9.2 (*) 13.0 - 17.0 g/dL   HCT 27.4 (*) 39.0 - 52.0 %   MCV 81.3  78.0 - 100.0 fL   MCH 27.3  26.0 - 34.0 pg   MCHC 33.6  30.0 - 36.0 g/dL   RDW 13.8  11.5 - 15.5 %   Platelets 239  150 - 400 K/uL   Neutrophils Relative % 79 (*) 43 - 77 %   Neutro Abs 7.1  1.7 - 7.7 K/uL   Lymphocytes Relative 13  12 - 46 %   Lymphs Abs 1.2  0.7 - 4.0 K/uL   Monocytes Relative 7  3 - 12 %   Monocytes Absolute 0.6  0.1 - 1.0 K/uL   Eosinophils Relative 1  0 - 5 %   Eosinophils Absolute 0.1  0.0 - 0.7 K/uL   Basophils Relative 0  0 - 1 %   Basophils Absolute 0.0  0.0 - 0.1 K/uL  GLUCOSE, CAPILLARY     Status: Abnormal   Collection Time    01/16/14  8:16 PM      Result Value Ref Range   Glucose-Capillary 158 (*) 70 - 99 mg/dL  URINALYSIS, ROUTINE W REFLEX MICROSCOPIC     Status: Abnormal   Collection Time    01/16/14  8:42 PM      Result Value Ref Range   Color, Urine YELLOW  YELLOW   APPearance CLEAR  CLEAR   Specific Gravity, Urine 1.018  1.005 - 1.030   pH 7.0  5.0 - 8.0   Glucose, UA 100 (*) NEGATIVE mg/dL   Hgb urine dipstick SMALL (*) NEGATIVE   Bilirubin Urine NEGATIVE  NEGATIVE   Ketones, ur NEGATIVE  NEGATIVE mg/dL   Protein, ur >300 (*) NEGATIVE mg/dL   Urobilinogen, UA 0.2  0.0 - 1.0 mg/dL   Nitrite NEGATIVE  NEGATIVE   Leukocytes, UA NEGATIVE  NEGATIVE  URINE MICROSCOPIC-ADD ON     Status: None   Collection Time    01/16/14  8:42 PM      Result Value Ref Range   WBC, UA 0-2  <3 WBC/hpf   RBC / HPF 3-6  <3 RBC/hpf   Bacteria, UA RARE  RARE  PRO B NATRIURETIC PEPTIDE     Status: Abnormal   Collection Time    01/16/14  8:43 PM      Result Value Ref Range   Pro B Natriuretic peptide (BNP) 3148.0 (*) 0 - 450 pg/mL  MRSA PCR SCREENING     Status: None   Collection Time    01/16/14 10:26 PM  Result Value Ref Range   MRSA by PCR NEGATIVE  NEGATIVE    Comment:            The GeneXpert MRSA Assay (FDA     approved for NASAL specimens     only), is one component of a     comprehensive MRSA colonization     surveillance program. It is not     intended to diagnose MRSA     infection nor to guide or     monitor treatment for     MRSA infections.  BASIC METABOLIC PANEL     Status: Abnormal   Collection Time    01/17/14  2:53 AM      Result Value Ref Range   Sodium 132 (*) 137 - 147 mEq/L   Potassium 4.2  3.7 - 5.3 mEq/L   Chloride 97  96 - 112 mEq/L   CO2 23  19 - 32 mEq/L   Glucose, Bld 142 (*) 70 - 99 mg/dL   BUN 16  6 - 23 mg/dL   Creatinine, Ser 1.12  0.50 - 1.35 mg/dL   Calcium 8.6  8.4 - 10.5 mg/dL   GFR calc non Af Amer 62 (*) >90 mL/min   GFR calc Af Amer 72 (*) >90 mL/min   Comment: (NOTE)     The eGFR has been calculated using the CKD EPI equation.     This calculation has not been validated in all clinical situations.     eGFR's persistently <90 mL/min signify possible Chronic Kidney     Disease.  CBC     Status: Abnormal   Collection Time    01/17/14  2:53 AM      Result Value Ref Range   WBC 7.9  4.0 - 10.5 K/uL   RBC 3.14 (*) 4.22 - 5.81 MIL/uL   Hemoglobin 8.6 (*) 13.0 - 17.0 g/dL   HCT 25.5 (*) 39.0 - 52.0 %   MCV 81.2  78.0 - 100.0 fL   MCH 27.4  26.0 - 34.0 pg   MCHC 33.7  30.0 - 36.0 g/dL   RDW 13.9  11.5 - 15.5 %   Platelets 218  150 - 400 K/uL  GLUCOSE, CAPILLARY     Status: Abnormal   Collection Time    01/17/14  8:14 AM      Result Value Ref Range   Glucose-Capillary 119 (*) 70 - 99 mg/dL  SURGICAL PCR SCREEN     Status: None   Collection Time    01/17/14  9:40 AM      Result Value Ref Range   MRSA, PCR NEGATIVE  NEGATIVE   Staphylococcus aureus NEGATIVE  NEGATIVE   Comment:            The Xpert SA Assay (FDA     approved for NASAL specimens     in patients over 39 years of age),     is one component of     a comprehensive surveillance     program.  Test performance has     been  validated by [employer] for patients greater     than or equal to 79 year old.     It is not intended     to diagnose infection nor to     guide or monitor treatment.  GLUCOSE, CAPILLARY     Status: Abnormal   Collection Time    01/17/14  9:54 AM  Result Value Ref Range   Glucose-Capillary 109 (*) 70 - 99 mg/dL   Dg Os Calcis Right  01/16/2014   CLINICAL DATA:  Large open wound over the calcaneus.  EXAM: RIGHT OS CALCIS - 2+ VIEW  COMPARISON:  None.  FINDINGS: There is a large soft tissue wound along the lateral margin calcaneus. Underlying bony destructive change in the posterior and lateral calcaneus is consistent with osteomyelitis. No radiopaque foreign body is identified. Vascular calcifications are noted.  IMPRESSION: Large skin ulceration over the lateral calcaneus with underlying osteomyelitis.   Electronically Signed   By: Inge Rise M.D.   On: 01/16/2014 17:42    Review of Systems  Constitutional: Positive for malaise/fatigue.  HENT: Negative.   Eyes: Negative.   Respiratory: Negative.   Cardiovascular: Negative.   Genitourinary: Negative.   Musculoskeletal: Negative.   Skin: Negative.   Psychiatric/Behavioral: Negative.     Blood pressure 136/62, pulse 79, temperature 99.2 F (37.3 C), temperature source Oral, resp. rate 18, height _0  (1.778 m), weight 78.5 kg (173 lb 1 oz), SpO2 97.00%. Physical Exam  Constitutional: He appears well-developed and well-nourished.  HENT:  Head: Normocephalic and atraumatic.  Eyes: Conjunctivae and EOM are normal. Pupils are equal, round, and reactive to light.  Respiratory: Effort normal.  GI: Soft.  Musculoskeletal:       Feet:  Neurological: He is alert.  Skin: Skin is warm.  Psychiatric: He has a normal mood and affect. His behavior is normal.     Assessment/Plan Irrigation and debridement of the heel with possible Acell and VAC placement.  [place] 01/17/2014, 11:41 AM

## 2014-01-17 NOTE — Anesthesia Preprocedure Evaluation (Signed)
Anesthesia Evaluation  Patient identified by MRN, date of birth, ID band Patient awake    Reviewed: Allergy & Precautions, H&P , NPO status , Patient's Chart, lab work & pertinent test results  Airway Mallampati: II  Neck ROM: full    Dental   Pulmonary neg pulmonary ROS,          Cardiovascular hypertension, + Peripheral Vascular Disease and +CHF + dysrhythmias Atrial Fibrillation + pacemaker     Neuro/Psych    GI/Hepatic GERD-  ,H/o esophageal stricture.   Endo/Other  diabetes, Type 2  Renal/GU      Musculoskeletal   Abdominal   Peds  Hematology   Anesthesia Other Findings   Reproductive/Obstetrics                           Anesthesia Physical Anesthesia Plan  ASA: III  Anesthesia Plan: General   Post-op Pain Management:    Induction: Intravenous  Airway Management Planned: LMA  Additional Equipment:   Intra-op Plan:   Post-operative Plan:   Informed Consent: I have reviewed the patients History and Physical, chart, labs and discussed the procedure including the risks, benefits and alternatives for the proposed anesthesia with the patient or authorized representative who has indicated his/her understanding and acceptance.     Plan Discussed with: CRNA, Anesthesiologist and Surgeon  Anesthesia Plan Comments:         Anesthesia Quick Evaluation

## 2014-01-17 NOTE — Progress Notes (Signed)
Patient back from surgery. Alert and oriented; no complaints. New orders acknowledged

## 2014-01-17 NOTE — Preoperative (Signed)
Beta Blockers   Reason not to administer Beta Blockers:Not Applicable 

## 2014-01-17 NOTE — Transfer of Care (Signed)
Immediate Anesthesia Transfer of Care Note  Patient: [name]  Procedure(s) Performed: Procedure(s): IRRIGATION AND DEBRIDEMENT RIGHT HEEL  WITH CULTURES AND BONE BIOPSY, placement of wound vac (Right)  Patient Location: PACU  Anesthesia Type:General  Level of Consciousness: awake, alert  and oriented  Airway & Oxygen Therapy: Patient Spontanous Breathing and Patient connected to nasal cannula oxygen  Post-op Assessment: Report given to PACU RN and Post -op Vital signs reviewed and stable  Post vital signs: Reviewed and stable  Complications: No apparent anesthesia complications

## 2014-01-17 NOTE — Progress Notes (Addendum)
Patient ID: [name], male   DOB: [birth year], 79 y.o.   MRN: [id number]         [facility]    Date of Admission:  01/16/2014   Total days of antibiotics 36         Active Problems:   Osteomyelitis   . [facility] HOLD] digoxin  0.125 mg Oral Daily  . [facility] HOLD] diltiazem  180 mg Oral Daily  . [MAR HOLD] insulin aspart  0-5 Units Subcutaneous QHS  . [MAR HOLD] insulin aspart  0-9 Units Subcutaneous TID WC  . [MAR HOLD] insulin glargine  10 Units Subcutaneous QHS  . [facility] HOLD] pantoprazole  40 mg Oral Daily    Subjective: [name] is a 79 year old gentleman with diabetes, diabetic neuropathy and a chronic open wound of his right heel. He has calcaneal osteomyelitis and has been followed at the wound or sooner. A wound culture obtained on January 22 showed abundant gram-negative rods and gram-positive cocci in pairs on Gram stain. Pseudomonas was isolated and culture and was sensitive to all antibiotics tested. He was started on ciprofloxacin 5 weeks ago. He saw my partner, Dr. Tommy Medal on February 19. Dr. Tommy Medal felt like cure of the infection and salvage of his foot would be extremely difficult and recommended below the knee amputation. He says he is not ready to accept that recommendation yet. He was admitted yesterday because of severe, increasing pain in his right lower leg associated with increasingly foul odor coming from the wound. He was seen by Dr. Meridee Score who also recommended below the knee amputation. He states that he would like to explore all other options before agreeing with amputation. He will be going to the operating room today and Dr. Lyndee Leo Sanger little debrid the wound. He denies any recent fever, chills or sweats.  Review of Systems: Pertinent items are noted in HPI.  Past Medical History  Diagnosis Date  . Fatigue   . Overweight   . Syncope and collapse   . GERD (gastroesophageal reflux disease)   . Right bundle branch block     . Unspecified venous (peripheral) insufficiency   . Phlebitis and thrombophlebitis of superficial vessels of lower extremities   . Cellulitis and abscess of leg, except foot   . Backache, unspecified   . Long term (current) use of anticoagulants   . Type II or unspecified type diabetes mellitus without mention of complication, not stated as uncontrolled   . Unspecified venous (peripheral) insufficiency   . Hypertension   . Gout, unspecified   . Heart failure   . Atrial fibrillation     permanent  . Esophageal stricture   . Hx of colonic polyps 05-2006    (Adenomatous)Dr. [name]  . Diverticulosis 05-2006    Dr. [name]   . Tachycardia-bradycardia     s/p PPM  . Pacemaker- [name] 02/20/2013    History  Substance Use Topics  . Smoking status: Never Smoker   . Smokeless tobacco: Never Used  . Alcohol Use: Yes     Comment: two drinks daily     Family History  Problem Relation Age of Onset  . Pneumonia Mother   . Heart disease Father   . Diabetes Father   . Colon cancer Neg Hx     Allergies  Allergen Reactions  . Morphine Rash and Other (See Comments)    Irritability also    Objective: Temp:  [98.3 F (36.8 C)-99.2 F (37.3  C)] 99.2 F (37.3 C) (02/27 1004) Pulse Rate:  [79-117] 79 (02/27 1004) Resp:  [18-20] 18 (02/27 1004) BP: (136-188)/(62-97) 136/62 mmHg (02/27 1004) SpO2:  [97 %-100 %] 97 % (02/27 1004) Weight:  [78.5 kg (173 lb 1 oz)-79.6 kg (175 lb 7.8 oz)] 78.5 kg (173 lb 1 oz) (02/27 0500)  General: He is in good spirits, joking with his nurses Skin: No acute rash Lungs: Clear Cor: Irregularly irregular S1 and S2 with no murmurs. Left anterior chest pacemaker site is normal Abdomen: Soft and nontender Joints and extremities: He has a deep ulcer on his right lateral heel with exposed bone.  Lab Results Lab Results  Component Value Date   WBC 7.9 01/17/2014   HGB 8.6* 01/17/2014   HCT 25.5* 01/17/2014   MCV 81.2 01/17/2014   PLT 218 01/17/2014     Lab Results  Component Value Date   CREATININE 1.12 01/17/2014   BUN 16 01/17/2014   NA 132* 01/17/2014   K 4.2 01/17/2014   CL 97 01/17/2014   CO2 23 01/17/2014    Lab Results  Component Value Date   ALT 12 09/30/2013   AST 20 09/30/2013   ALKPHOS 79 09/30/2013   BILITOT 0.8 09/30/2013      Microbiology: Recent Results (from the past 240 hour(s))  MRSA PCR SCREENING     Status: None   Collection Time    01/16/14 10:26 PM      Result Value Ref Range Status   MRSA by PCR NEGATIVE  NEGATIVE Final   Comment:            The GeneXpert MRSA Assay (FDA     approved for NASAL specimens     only), is one component of a     comprehensive MRSA colonization     surveillance program. It is not     intended to diagnose MRSA     infection nor to guide or     monitor treatment for     MRSA infections.    Studies/Results: Dg Os Calcis Right  01/16/2014   CLINICAL DATA:  Large open wound over the calcaneus.  EXAM: RIGHT OS CALCIS - 2+ VIEW  COMPARISON:  None.  FINDINGS: There is a large soft tissue wound along the lateral margin calcaneus. Underlying bony destructive change in the posterior and lateral calcaneus is consistent with osteomyelitis. No radiopaque foreign body is identified. Vascular calcifications are noted.  IMPRESSION: Large skin ulceration over the lateral calcaneus with underlying osteomyelitis.   Electronically Signed   By: Inge Rise M.D.   On: 01/16/2014 17:42   CT OF THE RIGHT FOOT WITH CONTRAST 12/25/2013   IMPRESSION:  Lateral hindfoot ulceration with osteomyelitis of the lateral  calcaneal tuberosity (image 28 series 3). No soft tissue abscess.    By: Dereck Ligas M.D.  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 Urine limb salvage will be difficult and will require a lengthy, comprehensive approach. His lower extremity arterial Dopplers were  within normal limits last month. It would be ideal to have deep bone and tissue submitted for Gram stain, aerobic and anaerobic cultures and then restart empiric vancomycin and piperacillin tazobactam. He says that he would like to hear Dr. Leafy Ro report before making any decision about amputation.  Plan: 1. Deep tissue for Gram stain, aerobic and anaerobic cultures 2. Restart vancomycin and piperacillin tazobactam postop  Michel Bickers, MD [facility] [phone] pager   [phone] cell 01/17/2014, 10:27 AM

## 2014-01-17 NOTE — Progress Notes (Signed)
Pt blood pressure is still elevated. Notified K. Schoor and no further orders given. Pt appears to be in no distress, no c/o headache. Will continue to monitor.

## 2014-01-17 NOTE — Progress Notes (Signed)
Spoke at length with patient and family,they now want to pursue a right BKA. Have, notified Dr Duda-tentatively scheduled for Monday am.

## 2014-01-17 NOTE — Progress Notes (Signed)
PATIENT DETAILS Name: [name] Age: 79 y.o. Sex: male Date of Birth: 01-17-1935 Admit Date: 01/16/2014 Admitting Physician Theodis Blaze, MD VC:[id number] EDWARD, MD  Subjective: Admitted for worsening drainage and ulceration in his right heel.  Assessment/Plan: Osteomyelitis of the right calcaneus - Difficult and complicated situation. I have spoken with Dr. Sharol Given and Dr. Migdalia Dk, who both feel that the best option here is to proceed with a amputation. However the patient is very reluctant, he had already seen Dr. Migdalia Dk in the outpatient setting and was scheduled for a wound debridement and bone biopsy. Patient does realize, that there is potential for him to have bacteremia that could potentially seed his pacemaker. Have spoken with Dr. Migdalia Dk, who asked that the patient be kept n.p.o., hold Eliquis, she will try and get him on the schedule for a debridement and bone biopsy today. Since at this time we are going to be pursuing a bone biopsy, I will stop antibiotics to see if we can get better culture results so that we can appropriately target with antibiotics. - Have asked infectious disease-Dr. Michel Bickers to see the patient and advise as well. If patient continues to insist on limb salvage and wound debridement with bone biopsy, he will likely need intravenous antibiotics reinitiated at some point.  Atrial fibrillation - Hold Eliquis for pending wound debridement. - Continue digoxin, Cardizem  Diabetes - CBGs stable - Continue with SSI and Lantus - Hold metformin while inpatient - Watch closely while n.p.o.  Chronic diastolic failure - Clinically compensated - Lasix currently on hold, would resume.  Disposition: Remain inpatient  DVT Prophylaxis: Not needed as an Eliquis  Code Status: Full code   Family Communication None at bedside  Procedures:  None  CONSULTS:  ID, orthopedic surgery and Plastics  Time spent 40 minutes-which includes 50%  of the time with face-to-face with patient/ family and coordinating care related to the above assessment and plan.    MEDICATIONS: Scheduled Meds: . digoxin  0.125 mg Oral Daily  . diltiazem  180 mg Oral Daily  . insulin aspart  0-5 Units Subcutaneous QHS  . insulin aspart  0-9 Units Subcutaneous TID WC  . insulin glargine  10 Units Subcutaneous QHS  . pantoprazole  40 mg Oral Daily   Continuous Infusions:  PRN Meds:.HYDROmorphone (DILAUDID) injection, ondansetron (ZOFRAN) IV, ondansetron, oxyCODONE  Antibiotics: Anti-infectives   Start     Dose/Rate Route Frequency Ordered Stop   01/17/14 0800  vancomycin (VANCOCIN) IVPB 750 mg/150 ml premix  Status:  Discontinued     750 mg 150 mL/hr over 60 Minutes Intravenous Every 12 hours 01/16/14 1657 01/17/14 0820   01/17/14 0000  piperacillin-tazobactam (ZOSYN) IVPB 3.375 g  Status:  Discontinued     3.375 g 12.5 mL/hr over 240 Minutes Intravenous Every 8 hours 01/16/14 1657 01/17/14 0820   01/16/14 1645  piperacillin-tazobactam (ZOSYN) IVPB 3.375 g     3.375 g 100 mL/hr over 30 Minutes Intravenous  Once 01/16/14 1642 01/16/14 1926   01/16/14 1645  vancomycin (VANCOCIN) IVPB 1000 mg/200 mL premix     1,000 mg 200 mL/hr over 60 Minutes Intravenous  Once 01/16/14 1642 01/16/14 1926       PHYSICAL EXAM: Vital signs in last 24 hours: Filed Vitals:   01/16/14 2311 01/17/14 0500 01/17/14 0522 01/17/14 1004  BP: 170/79  153/75 136/62  Pulse: 108  91 79  Temp:   98.6 F (37 C) 99.2 F (  37.3 C)  TempSrc:   Oral Oral  Resp:   18 18  Height:      Weight:  78.5 kg (173 lb 1 oz)    SpO2:   98% 97%    Weight change:  Filed Weights   01/16/14 1958 01/17/14 0500  Weight: 79.6 kg (175 lb 7.8 oz) 78.5 kg (173 lb 1 oz)   Body mass index is 24.83 kg/(m^2).   Gen Exam: Awake and alert with clear speech.   Neck: Supple, No JVD.   Chest: B/L Clear.   CVS: S1 S2 Regular Abdomen: soft, BS +, non tender, non distended.  Extremities: no  edema, lower extremities warm to touch. Approximately 2 cm diameter necrotic ulcers seen on the lateral aspect of the right calcaneus with purulent material. Neurologic: Non Focal.   Skin: No Rash.   Wounds: [name].   Intake/Output from previous day:  Intake/Output Summary (Last 24 hours) at 01/17/14 1013 Last data filed at 01/17/14 0900  Gross per 24 hour  Intake      0 ml  Output   1100 ml  Net  -1100 ml     LAB RESULTS: CBC  Recent Labs Lab 01/16/14 1623 01/16/14 1818 01/17/14 0253  WBC 11.1* 9.0 7.9  HGB 10.2* 9.2* 8.6*  HCT 30.0* 27.4* 25.5*  PLT 296 239 218  MCV 81.3 81.3 81.2  MCH 27.6 27.3 27.4  MCHC 34.0 33.6 33.7  RDW 13.7 13.8 13.9  LYMPHSABS  --  1.2  --   MONOABS  --  0.6  --   EOSABS  --  0.1  --   BASOSABS  --  0.0  --     Chemistries   Recent Labs Lab 01/16/14 1623 01/17/14 0253  NA 131* 132*  K 4.7 4.2  CL 93* 97  CO2 26 23  GLUCOSE 191* 142*  BUN 17 16  CREATININE 1.19 1.12  CALCIUM 9.4 8.6    CBG:  Recent Labs Lab 01/15/14 1559 01/15/14 1745 01/16/14 2016 01/17/14 0814 01/17/14 0954  GLUCAP 224* 186* 158* 119* 109*    GFR Estimated Creatinine Clearance: 57.9 ml/min (by C-G formula based on Cr of 1.12).  Coagulation profile No results found for this basename: INR, PROTIME,  in the last 168 hours  Cardiac Enzymes No results found for this basename: CK, CKMB, TROPONINI, MYOGLOBIN,  in the last 168 hours  No components found with this basename: POCBNP,  No results found for this basename: DDIMER,  in the last 72 hours No results found for this basename: HGBA1C,  in the last 72 hours No results found for this basename: CHOL, HDL, LDLCALC, TRIG, CHOLHDL, LDLDIRECT,  in the last 72 hours No results found for this basename: TSH, T4TOTAL, FREET3, T3FREE, THYROIDAB,  in the last 72 hours No results found for this basename: VITAMINB12, FOLATE, FERRITIN, TIBC, IRON, RETICCTPCT,  in the last 72 hours No results found for this  basename: LIPASE, AMYLASE,  in the last 72 hours  Urine Studies No results found for this basename: UACOL, UAPR, USPG, UPH, UTP, UGL, UKET, UBIL, UHGB, UNIT, UROB, ULEU, UEPI, UWBC, URBC, UBAC, CAST, CRYS, UCOM, BILUA,  in the last 72 hours  MICROBIOLOGY: Recent Results (from the past 240 hour(s))  MRSA PCR SCREENING     Status: None   Collection Time    01/16/14 10:26 PM      Result Value Ref Range Status   MRSA by PCR NEGATIVE  NEGATIVE Final   Comment:  The GeneXpert MRSA Assay (FDA     approved for NASAL specimens     only), is one component of a     comprehensive MRSA colonization     surveillance program. It is not     intended to diagnose MRSA     infection nor to guide or     monitor treatment for     MRSA infections.    RADIOLOGY STUDIES/RESULTS: Dg Os Calcis Right  01/16/2014   CLINICAL DATA:  Large open wound over the calcaneus.  EXAM: RIGHT OS CALCIS - 2+ VIEW  COMPARISON:  None.  FINDINGS: There is a large soft tissue wound along the lateral margin calcaneus. Underlying bony destructive change in the posterior and lateral calcaneus is consistent with osteomyelitis. No radiopaque foreign body is identified. Vascular calcifications are noted.  IMPRESSION: Large skin ulceration over the lateral calcaneus with underlying osteomyelitis.   Electronically Signed   By: Inge Rise M.D.   On: 01/16/2014 17:42   Ct Foot Right W Contrast  12/25/2013   CLINICAL DATA:  Osteomyelitis.  EXAM: CT OF THE RIGHT FOOT WITH CONTRAST  TECHNIQUE: Multidetector CT imaging was performed following the standard protocol during bolus administration of intravenous contrast.  CONTRAST:  138mL OMNIPAQUE IOHEXOL 300 MG/ML  SOLN  COMPARISON:  DG OS CALCIS*R* dated 12/12/2013  FINDINGS: There is an ulcer along the lateral aspect of the calcaneus, extending to the cortical surface. There is rare fraction of bone and cortical osteolysis compatible with active osteomyelitis in a lateral calcaneus  deep to the ulcer. Gas extends through the ulcer tract to the bone surface. There midfoot erosions in the medial cuneiform bone which may be associated with the patient's history of gout arthritis or ordinary degenerative disease. First MTP joint osteoarthritis is mild. Old avulsion fragments are present adjacent to the medial malleolus in the deltoid ligament. Calcification of the posterior talofibular ligament is incidentally noted. No soft tissue abscess is identified. Grossly, the Achilles tendon and plantar fascia appear within normal limits.  IMPRESSION: Lateral hindfoot ulceration with osteomyelitis of the lateral calcaneal tuberosity (image 28 series 3). No soft tissue abscess.   Electronically Signed   By: Dereck Ligas M.D.   On: 12/25/2013 15:46    Oren Binet, MD  [facility] Pager:[phone]  If 7PM-7AM, please contact night-coverage [email or web address] Password [email or web address] 01/17/2014, 10:13 AM   LOS: 1 day

## 2014-01-17 NOTE — Brief Op Note (Signed)
01/16/2014 - 01/17/2014  12:32 PM  PATIENT:  [name]  79 y.o. male  PRE-OPERATIVE DIAGNOSIS:  Right  HEEL INFECTION  POST-OPERATIVE DIAGNOSIS:  Right  HEEL INFECTION  PROCEDURE:  Procedure(s): IRRIGATION AND DEBRIDEMENT RIGHT HEEL  WITH CULTURES AND BONE BIOPSY, placement of wound vac (Right) ACELL and VAC placement  SURGEON:  Surgeon(s) and Role:    * Claire Sanger, DO - Primary  PHYSICIAN ASSISTANT: None  ASSISTANTS: none   ANESTHESIA:   general  EBL:  Total I/O In: 800 [I.V.:800] Out: -   BLOOD ADMINISTERED:none  DRAINS: none   LOCAL MEDICATIONS USED:  NONE  SPECIMEN:  Source of Specimen:  right calcaneous bone  DISPOSITION OF SPECIMEN:  micro  COUNTS:  YES  TOURNIQUET:  * No tourniquets in log *  DICTATION: .Dragon Dictation  PLAN OF CARE: Admit to inpatient   PATIENT DISPOSITION:  PACU - hemodynamically stable.   Delay start of Pharmacological VTE agent (>24hrs) due to surgical blood loss or risk of bleeding: no

## 2014-01-17 NOTE — Consult Note (Signed)
Reason for Consult: Osteomyelitis abscess right calcaneus. Referring Physician: Dr. Tommy Medal is an 79 y.o. male.  HPI: Patient is a 79 year old gentleman with multiple medical problems including diabetes peripheral vascular disease cardiac pacemaker with a history of a spinal infection status post spinal instrumentation. Patient presents with a draining ulcer lateral border right heel. Patient has been treated at the wound center with hyperbaric oxygen treatments x10. Patient states that he is scheduled for surgical intervention with Dr. Migdalia Dk on Wednesday.  Past Medical History  Diagnosis Date  . Fatigue   . Overweight   . Syncope and collapse   . GERD (gastroesophageal reflux disease)   . Right bundle branch block   . Unspecified venous (peripheral) insufficiency   . Phlebitis and thrombophlebitis of superficial vessels of lower extremities   . Cellulitis and abscess of leg, except foot   . Backache, unspecified   . Long term (current) use of anticoagulants   . Type II or unspecified type diabetes mellitus without mention of complication, not stated as uncontrolled   . Unspecified venous (peripheral) insufficiency   . Hypertension   . Gout, unspecified   . Heart failure   . Atrial fibrillation     permanent  . Esophageal stricture   . Hx of colonic polyps 05-2006    (Adenomatous)Dr. [name]  . Diverticulosis 05-2006    Dr. [name]   . Tachycardia-bradycardia     s/p PPM  . Pacemaker- [name] 02/20/2013    Past Surgical History  Procedure Laterality Date  . Laminectomy    . Lumbar fusion    . Rotator cuff repair    . Ruptured rt rectus muscle    . Eye surgery    . Pacemaker insertion  8/12    SJM by [name] for tachy/brady syndrome    Family History  Problem Relation Age of Onset  . Pneumonia Mother   . Heart disease Father   . Diabetes Father   . Colon cancer Neg Hx     Social History:  reports that he has never smoked. He has never used  smokeless tobacco. He reports that he drinks alcohol. He reports that he does not use illicit drugs.  Allergies:  Allergies  Allergen Reactions  . Morphine Rash and Other (See Comments)    Irritability also    Medications: I have reviewed the patient's current medications.  Results for orders placed during the hospital encounter of 01/16/14 (from the past 48 hour(s))  CBC     Status: Abnormal   Collection Time    01/16/14  4:23 PM      Result Value Ref Range   WBC 11.1 (*) 4.0 - 10.5 K/uL   RBC 3.69 (*) 4.22 - 5.81 MIL/uL   Hemoglobin 10.2 (*) 13.0 - 17.0 g/dL   HCT 30.0 (*) 39.0 - 52.0 %   MCV 81.3  78.0 - 100.0 fL   MCH 27.6  26.0 - 34.0 pg   MCHC 34.0  30.0 - 36.0 g/dL   RDW 13.7  11.5 - 15.5 %   Platelets 296  150 - 400 K/uL  BASIC METABOLIC PANEL     Status: Abnormal   Collection Time    01/16/14  4:23 PM      Result Value Ref Range   Sodium 131 (*) 137 - 147 mEq/L   Potassium 4.7  3.7 - 5.3 mEq/L   Chloride 93 (*) 96 - 112 mEq/L   CO2 26  19 -  32 mEq/L   Glucose, Bld 191 (*) 70 - 99 mg/dL   BUN 17  6 - 23 mg/dL   Creatinine, Ser 1.19  0.50 - 1.35 mg/dL   Calcium 9.4  8.4 - 10.5 mg/dL   GFR calc non Af Amer 58 (*) >90 mL/min   GFR calc Af Amer 67 (*) >90 mL/min   Comment: (NOTE)     The eGFR has been calculated using the CKD EPI equation.     This calculation has not been validated in all clinical situations.     eGFR's persistently <90 mL/min signify possible Chronic Kidney     Disease.  I-STAT CG4 LACTIC ACID, ED     Status: None   Collection Time    01/16/14  5:07 PM      Result Value Ref Range   Lactic Acid, Venous 1.62  0.5 - 2.2 mmol/L  CBC WITH DIFFERENTIAL     Status: Abnormal   Collection Time    01/16/14  6:18 PM      Result Value Ref Range   WBC 9.0  4.0 - 10.5 K/uL   RBC 3.37 (*) 4.22 - 5.81 MIL/uL   Hemoglobin 9.2 (*) 13.0 - 17.0 g/dL   HCT 27.4 (*) 39.0 - 52.0 %   MCV 81.3  78.0 - 100.0 fL   MCH 27.3  26.0 - 34.0 pg   MCHC 33.6  30.0 -  36.0 g/dL   RDW 13.8  11.5 - 15.5 %   Platelets 239  150 - 400 K/uL   Neutrophils Relative % 79 (*) 43 - 77 %   Neutro Abs 7.1  1.7 - 7.7 K/uL   Lymphocytes Relative 13  12 - 46 %   Lymphs Abs 1.2  0.7 - 4.0 K/uL   Monocytes Relative 7  3 - 12 %   Monocytes Absolute 0.6  0.1 - 1.0 K/uL   Eosinophils Relative 1  0 - 5 %   Eosinophils Absolute 0.1  0.0 - 0.7 K/uL   Basophils Relative 0  0 - 1 %   Basophils Absolute 0.0  0.0 - 0.1 K/uL  GLUCOSE, CAPILLARY     Status: Abnormal   Collection Time    01/16/14  8:16 PM      Result Value Ref Range   Glucose-Capillary 158 (*) 70 - 99 mg/dL  URINALYSIS, ROUTINE W REFLEX MICROSCOPIC     Status: Abnormal   Collection Time    01/16/14  8:42 PM      Result Value Ref Range   Color, Urine YELLOW  YELLOW   APPearance CLEAR  CLEAR   Specific Gravity, Urine 1.018  1.005 - 1.030   pH 7.0  5.0 - 8.0   Glucose, UA 100 (*) NEGATIVE mg/dL   Hgb urine dipstick SMALL (*) NEGATIVE   Bilirubin Urine NEGATIVE  NEGATIVE   Ketones, ur NEGATIVE  NEGATIVE mg/dL   Protein, ur >300 (*) NEGATIVE mg/dL   Urobilinogen, UA 0.2  0.0 - 1.0 mg/dL   Nitrite NEGATIVE  NEGATIVE   Leukocytes, UA NEGATIVE  NEGATIVE  URINE MICROSCOPIC-ADD ON     Status: None   Collection Time    01/16/14  8:42 PM      Result Value Ref Range   WBC, UA 0-2  <3 WBC/hpf   RBC / HPF 3-6  <3 RBC/hpf   Bacteria, UA RARE  RARE  PRO B NATRIURETIC PEPTIDE     Status: Abnormal   Collection Time  01/16/14  8:43 PM      Result Value Ref Range   Pro B Natriuretic peptide (BNP) 3148.0 (*) 0 - 450 pg/mL  MRSA PCR SCREENING     Status: None   Collection Time    01/16/14 10:26 PM      Result Value Ref Range   MRSA by PCR NEGATIVE  NEGATIVE   Comment:            The GeneXpert MRSA Assay (FDA     approved for NASAL specimens     only), is one component of a     comprehensive MRSA colonization     surveillance program. It is not     intended to diagnose MRSA     infection nor to guide or      monitor treatment for     MRSA infections.  BASIC METABOLIC PANEL     Status: Abnormal   Collection Time    01/17/14  2:53 AM      Result Value Ref Range   Sodium 132 (*) 137 - 147 mEq/L   Potassium 4.2  3.7 - 5.3 mEq/L   Chloride 97  96 - 112 mEq/L   CO2 23  19 - 32 mEq/L   Glucose, Bld 142 (*) 70 - 99 mg/dL   BUN 16  6 - 23 mg/dL   Creatinine, Ser 1.12  0.50 - 1.35 mg/dL   Calcium 8.6  8.4 - 10.5 mg/dL   GFR calc non Af Amer 62 (*) >90 mL/min   GFR calc Af Amer 72 (*) >90 mL/min   Comment: (NOTE)     The eGFR has been calculated using the CKD EPI equation.     This calculation has not been validated in all clinical situations.     eGFR's persistently <90 mL/min signify possible Chronic Kidney     Disease.  CBC     Status: Abnormal   Collection Time    01/17/14  2:53 AM      Result Value Ref Range   WBC 7.9  4.0 - 10.5 K/uL   RBC 3.14 (*) 4.22 - 5.81 MIL/uL   Hemoglobin 8.6 (*) 13.0 - 17.0 g/dL   HCT 25.5 (*) 39.0 - 52.0 %   MCV 81.2  78.0 - 100.0 fL   MCH 27.4  26.0 - 34.0 pg   MCHC 33.7  30.0 - 36.0 g/dL   RDW 13.9  11.5 - 15.5 %   Platelets 218  150 - 400 K/uL  GLUCOSE, CAPILLARY     Status: Abnormal   Collection Time    01/17/14  8:14 AM      Result Value Ref Range   Glucose-Capillary 119 (*) 70 - 99 mg/dL    Dg Os Calcis Right  01/16/2014   CLINICAL DATA:  Large open wound over the calcaneus.  EXAM: RIGHT OS CALCIS - 2+ VIEW  COMPARISON:  None.  FINDINGS: There is a large soft tissue wound along the lateral margin calcaneus. Underlying bony destructive change in the posterior and lateral calcaneus is consistent with osteomyelitis. No radiopaque foreign body is identified. Vascular calcifications are noted.  IMPRESSION: Large skin ulceration over the lateral calcaneus with underlying osteomyelitis.   Electronically Signed   By: Inge Rise M.D.   On: 01/16/2014 17:42    Review of Systems  All other systems reviewed and are negative.   Blood pressure 153/75,  pulse 91, temperature 98.6 F (37 C), temperature source Oral, resp. rate 18, height  _0  (1.778 m), weight 78.5 kg (173 lb 1 oz), SpO2 98.00%. Physical Exam On examination I cannot palpate a dorsalis pedis pulse his ankle brachial indices obtained in January showed falsely elevated ankle-brachial indices did show triphasic flow but his clinical vascular status does not reflect these numbers. On examination he has a 2 cm in diameter 1 cm deep necrotic ulcer over the lateral aspect of the calcaneus. This probes all the way to bone with exposed calcaneus. Review of the radiographs of the calcaneus shows significant destructive changes consistent with chronic osteomyelitis of the calcaneus with extensive destructive changes. Assessment/Plan: Assessment: Diabetic insensate neuropathy peripheral vascular disease with a large necrotic ulcer over the lateral calcaneus on the right with extensive destructive changes of the calcaneus with osteomyelitis.  Plan: I feel with the patient's pacemaker and multiple medical problems his safest option would be to proceed with a transtibial amputation. I feel that limb salvage is an option but I feel like his chance of limb salvage success is probably less than 50%. For attempted limb salvage patient would require a large calcaneal ostectomy with placement of antibiotic beads wound closure and placement of a wound VAC. In my opinion I do not feel that continued hyperbaric therapy with wound debridement and tissue grafting would be beneficial. I had a long discussion with the patient at bedside. Discussed that if he has any further questions to call me on my phone. I would be available for surgical intervention on Monday. With the patient's chronic infection and his cardiac pacemaker he is at risk of infection of the pacemaker.  Liborio Saccente V 01/17/2014, 8:19 AM

## 2014-01-18 ENCOUNTER — Inpatient Hospital Stay (HOSPITAL_COMMUNITY): Payer: Medicare Other

## 2014-01-18 DIAGNOSIS — M869 Osteomyelitis, unspecified: Secondary | ICD-10-CM

## 2014-01-18 LAB — CBC
HEMATOCRIT: 25.8 % — AB (ref 39.0–52.0)
Hemoglobin: 8.4 g/dL — ABNORMAL LOW (ref 13.0–17.0)
MCH: 26.7 pg (ref 26.0–34.0)
MCHC: 32.6 g/dL (ref 30.0–36.0)
MCV: 81.9 fL (ref 78.0–100.0)
PLATELETS: 244 10*3/uL (ref 150–400)
RBC: 3.15 MIL/uL — ABNORMAL LOW (ref 4.22–5.81)
RDW: 14.1 % (ref 11.5–15.5)
WBC: 8 10*3/uL (ref 4.0–10.5)

## 2014-01-18 LAB — BASIC METABOLIC PANEL
BUN: 17 mg/dL (ref 6–23)
CHLORIDE: 96 meq/L (ref 96–112)
CO2: 25 mEq/L (ref 19–32)
CREATININE: 1.27 mg/dL (ref 0.50–1.35)
Calcium: 8.3 mg/dL — ABNORMAL LOW (ref 8.4–10.5)
GFR calc Af Amer: 62 mL/min — ABNORMAL LOW (ref >90)
GFR calc non Af Amer: 53 mL/min — ABNORMAL LOW (ref >90)
Glucose, Bld: 192 mg/dL — ABNORMAL HIGH (ref 70–99)
Potassium: 4.2 mEq/L (ref 3.7–5.3)
Sodium: 132 mEq/L — ABNORMAL LOW (ref 137–147)

## 2014-01-18 LAB — GLUCOSE, CAPILLARY
GLUCOSE-CAPILLARY: 163 mg/dL — AB (ref 70–99)
Glucose-Capillary: 179 mg/dL — ABNORMAL HIGH (ref 70–99)
Glucose-Capillary: 220 mg/dL — ABNORMAL HIGH (ref 70–99)
Glucose-Capillary: 135 mg/dL — ABNORMAL HIGH (ref 70–99)

## 2014-01-18 MED ORDER — IBUPROFEN 600 MG PO TABS
600.0000 mg | ORAL_TABLET | Freq: Four times a day (QID) | ORAL | Status: DC | PRN
  Administered 2014-01-18 – 2014-01-19 (×3): 600 mg via ORAL
  Filled 2014-01-18 (×3): qty 1

## 2014-01-18 MED ORDER — ZOLPIDEM TARTRATE 5 MG PO TABS
5.0000 mg | ORAL_TABLET | Freq: Every evening | ORAL | Status: DC | PRN
  Administered 2014-01-18 – 2014-01-23 (×5): 5 mg via ORAL
  Filled 2014-01-18 (×5): qty 1

## 2014-01-18 NOTE — Progress Notes (Signed)
Patient ID: [name], male   DOB: 01/12/1935, 79 y.o.   MRN: [id number]         [facility]    Date of Admission:  01/16/2014   Total days of antibiotics 37        Day 3 vanco        Day 3 piptazo         Active Problems:   Osteomyelitis   Ulcer of heel   . apixaban  5 mg Oral BID  . digoxin  0.125 mg Oral Daily  . diltiazem  180 mg Oral Daily  . furosemide  40 mg Oral Daily  . insulin aspart  0-5 Units Subcutaneous QHS  . insulin aspart  0-9 Units Subcutaneous TID WC  . insulin glargine  10 Units Subcutaneous QHS  . pantoprazole  40 mg Oral Daily  . piperacillin-tazobactam (ZOSYN)  IV  3.375 g Intravenous Q8H  . vancomycin  750 mg Intravenous Q12H    Subjective: [name] is a 79 year old gentleman with diabetes, diabetic neuropathy and a chronic open wound of his right heel. He has calcaneal osteomyelitis and has been followed at the wound or sooner. A wound culture obtained on January 22 showed  Pseudomonas was isolated and  He was started on ciprofloxacin 5 weeks ago.  He was admitted yesterday because of severe, increasing pain in his right lower leg associated with increasingly foul odor coming from the wound.POD#1 from debridement of right heel  Patient now open to undergoing R BKA   Objective: Temp:  [97.5 F (36.4 C)-99.4 F (37.4 C)] 98.7 F (37.1 C) (02/28 0923) Pulse Rate:  [69-93] 93 (02/28 0923) Resp:  [13-18] 18 (02/28 0923) BP: (111-148)/(51-72) 144/72 mmHg (02/28 0923) SpO2:  [96 %-100 %] 96 % (02/28 0923)  General: a x o by 3 in NAD HEENT: NCAT, PERRLA, EOMI, no scleral icterus. No thrush Lungs: Clear Cor: Irregularly irregular S1 and S2 with no murmurs. Left anterior chest pacemaker site is normal Abdomen: Soft and nontender Joints and extremities: right ankle is wrapped with wound vac in place  Lab Results Lab Results  Component Value Date   WBC 8.0 01/18/2014   HGB 8.4* 01/18/2014   HCT 25.8* 01/18/2014   MCV 81.9 01/18/2014   PLT 244 01/18/2014    Lab Results  Component Value Date   CREATININE 1.27 01/18/2014   BUN 17 01/18/2014   NA 132* 01/18/2014   K 4.2 01/18/2014   CL 96 01/18/2014   CO2 25 01/18/2014    Lab Results  Component Value Date   ALT 12 09/30/2013   AST 20 09/30/2013   ALKPHOS 79 09/30/2013   BILITOT 0.8 09/30/2013      Microbiology: Recent Results (from the past 240 hour(s))  CULTURE, BLOOD (ROUTINE X 2)     Status: None   Collection Time    01/16/14  4:44 PM      Result Value Ref Range Status   Specimen Description BLOOD ARM LEFT   Final   Special Requests BOTTLES DRAWN AEROBIC AND ANAEROBIC 5CC   Final   Culture  Setup Time     Final   Value: 01/16/2014 22:48     Performed at [facility]   Culture     Final   Value:        BLOOD CULTURE RECEIVED NO GROWTH TO DATE CULTURE WILL BE HELD FOR 5 DAYS BEFORE ISSUING A FINAL NEGATIVE REPORT  Performed at [facility]   Report Status PENDING   Incomplete  CULTURE, BLOOD (ROUTINE X 2)     Status: None   Collection Time    01/16/14  4:45 PM      Result Value Ref Range Status   Specimen Description BLOOD ARM RIGHT   Final   Special Requests BOTTLES DRAWN AEROBIC AND ANAEROBIC 10CC   Final   Culture  Setup Time     Final   Value: 01/16/2014 22:48     Performed at [facility]   Culture     Final   Value:        BLOOD CULTURE RECEIVED NO GROWTH TO DATE CULTURE WILL BE HELD FOR 5 DAYS BEFORE ISSUING A FINAL NEGATIVE REPORT     Performed at [facility]   Report Status PENDING   Incomplete  URINE CULTURE     Status: None   Collection Time    01/16/14  8:42 PM      Result Value Ref Range Status   Specimen Description URINE, RANDOM   Final   Special Requests NONE   Final   Culture  Setup Time     Final   Value: 01/17/2014 01:19     Performed at [facility]     Final   Value: 65,000 COLONIES/ML     Performed at [facility]   Culture     Final    Value: Multiple bacterial morphotypes present, none predominant. Suggest appropriate recollection if clinically indicated.     Performed at [facility]   Report Status 01/17/2014 FINAL   Final  MRSA PCR SCREENING     Status: None   Collection Time    01/16/14 10:26 PM      Result Value Ref Range Status   MRSA by PCR NEGATIVE  NEGATIVE Final   Comment:            The GeneXpert MRSA Assay (FDA     approved for NASAL specimens     only), is one component of a     comprehensive MRSA colonization     surveillance program. It is not     intended to diagnose MRSA     infection nor to guide or     monitor treatment for     MRSA infections.  SURGICAL PCR SCREEN     Status: None   Collection Time    01/17/14  9:40 AM      Result Value Ref Range Status   MRSA, PCR NEGATIVE  NEGATIVE Final   Staphylococcus aureus NEGATIVE  NEGATIVE Final   Comment:            The Xpert SA Assay (FDA     approved for NASAL specimens     in patients over 20 years of age),     is one component of     a comprehensive surveillance     program.  Test performance has     been validated by [employer] for patients greater     than or equal to 82 year old.     It is not intended     to diagnose infection nor to     guide or monitor treatment.    Studies/Results: Dg Os Calcis Right  01/16/2014   CLINICAL DATA:  Large open wound over the calcaneus.  EXAM: RIGHT OS CALCIS - 2+ VIEW  COMPARISON:  None.  FINDINGS: There is a large soft tissue wound along the lateral margin calcaneus. Underlying bony destructive change in the posterior and lateral calcaneus is consistent with osteomyelitis. No radiopaque foreign body is identified. Vascular calcifications are noted.  IMPRESSION: Large skin ulceration over the lateral calcaneus with underlying osteomyelitis.   Electronically Signed   By: Inge Rise M.D.   On: 01/16/2014 17:42   CT OF THE RIGHT FOOT WITH CONTRAST 12/25/2013   IMPRESSION:  Lateral  hindfoot ulceration with osteomyelitis of the lateral  calcaneal tuberosity (image 28 series 3). No soft tissue abscess.    By: Dereck Ligas M.D.  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 Await Deep tissue culture to see if need to adjust antibiotics 2. continue vancomycin and piperacillin tazobactam for now 3. After BKA can likely due mop up with oral antibiotics x 5-7 days  Carlyle Basques, [place] for [place] [phone]  [phone] cell 01/18/2014, 11:34 AM

## 2014-01-18 NOTE — Progress Notes (Signed)
PATIENT DETAILS Name: [name] Age: 79 y.o. Sex: male Date of Birth: 12-28-1934 Admit Date: 01/16/2014 Admitting Physician Theodis Blaze, MD GGY:IRSWNIO,EVOJ EDWARD, MD  Subjective: No major events overnight.  Assessment/Plan: Osteomyelitis of the right calcaneus - Patient was admitted, initially refused to undergo a BKA, plastics took the patient to the OR on 2/27 for wound debridement and bone biopsy. Postoperatively, patient changed his mind and now was agreeable to undergo a right BKA. Dr. Sharol Given has been contacted, current plans are for BKA on Monday. In the interim, continue with vancomycin and Zosyn. Per Dr. Sharol Given, okay to continue with Eliquis.   Atrial fibrillation - Continue with Eliquis - per Dr. Sharol Given, okay to continue with Eliquis for BKA - Continue digoxin, Cardizem  Diabetes - CBGs stable - Continue with SSI and Lantus - Hold metformin while inpatient  Chronic diastolic failure - Clinically compensated - Continue Lasix at half of home dose  Disposition: Remain inpatient  DVT Prophylaxis: Not needed as an Eliquis  Code Status: Full code   Family Communication None at bedside  Procedures:  None  CONSULTS:  ID, orthopedic surgery and Plastics   MEDICATIONS: Scheduled Meds: . apixaban  5 mg Oral BID  . digoxin  0.125 mg Oral Daily  . diltiazem  180 mg Oral Daily  . furosemide  40 mg Oral Daily  . insulin aspart  0-5 Units Subcutaneous QHS  . insulin aspart  0-9 Units Subcutaneous TID WC  . insulin glargine  10 Units Subcutaneous QHS  . pantoprazole  40 mg Oral Daily  . piperacillin-tazobactam (ZOSYN)  IV  3.375 g Intravenous Q8H  . vancomycin  750 mg Intravenous Q12H   Continuous Infusions:  PRN Meds:.HYDROmorphone (DILAUDID) injection, ondansetron (ZOFRAN) IV, ondansetron, oxyCODONE  Antibiotics: Anti-infectives   Start     Dose/Rate Route Frequency Ordered Stop   01/17/14 1600  piperacillin-tazobactam (ZOSYN) IVPB 3.375 g       3.375 g 12.5 mL/hr over 240 Minutes Intravenous Every 8 hours 01/17/14 1335     01/17/14 1500  vancomycin (VANCOCIN) IVPB 750 mg/150 ml premix     750 mg 150 mL/hr over 60 Minutes Intravenous Every 12 hours 01/17/14 1335     01/17/14 1218  polymyxin B 500,000 Units, bacitracin 50,000 Units in sodium chloride irrigation 0.9 % 500 mL irrigation  Status:  Discontinued       As needed 01/17/14 1218 01/17/14 1237   01/17/14 1100  piperacillin-tazobactam (ZOSYN) IVPB 3.375 g  Status:  Discontinued     3.375 g 100 mL/hr over 30 Minutes Intravenous  Once 01/17/14 1055 01/17/14 1405   01/17/14 0800  vancomycin (VANCOCIN) IVPB 750 mg/150 ml premix  Status:  Discontinued     750 mg 150 mL/hr over 60 Minutes Intravenous Every 12 hours 01/16/14 1657 01/17/14 0820   01/17/14 0000  piperacillin-tazobactam (ZOSYN) IVPB 3.375 g  Status:  Discontinued     3.375 g 12.5 mL/hr over 240 Minutes Intravenous Every 8 hours 01/16/14 1657 01/17/14 0820   01/16/14 1645  piperacillin-tazobactam (ZOSYN) IVPB 3.375 g     3.375 g 100 mL/hr over 30 Minutes Intravenous  Once 01/16/14 1642 01/16/14 1926   01/16/14 1645  vancomycin (VANCOCIN) IVPB 1000 mg/200 mL premix     1,000 mg 200 mL/hr over 60 Minutes Intravenous  Once 01/16/14 1642 01/16/14 1926       PHYSICAL EXAM: Vital signs in last 24 hours: Filed Vitals:   01/17/14  2021 01/18/14 0012 01/18/14 0402 01/18/14 0923  BP: 145/66 135/63 148/66 144/72  Pulse: 72 75 83 93  Temp: 98.2 F (36.8 C) 99.4 F (37.4 C) 99.2 F (37.3 C) 98.7 F (37.1 C)  TempSrc: Oral Oral Oral Oral  Resp: 18 18 18 18   Height:      Weight:      SpO2: 99% 98% 97% 96%    Weight change:  Filed Weights   01/16/14 1958 01/17/14 0500  Weight: 79.6 kg (175 lb 7.8 oz) 78.5 kg (173 lb 1 oz)   Body mass index is 24.83 kg/(m^2).   Gen Exam: Awake and alert with clear speech.   Neck: Supple, No JVD.   Chest: B/L Clear.   CVS: S1 S2 Regular Abdomen: soft, BS +, non tender, non  distended.  Extremities: no edema, lower extremities warm to touch. Right leg bandaged with a VAC in place did not open. Neurologic: Non Focal.   Skin: No Rash.   Wounds: [name].   Intake/Output from previous day:  Intake/Output Summary (Last 24 hours) at 01/18/14 1136 Last data filed at 01/18/14 1109  Gross per 24 hour  Intake 1491.17 ml  Output    400 ml  Net 1091.17 ml     LAB RESULTS: CBC  Recent Labs Lab 01/16/14 1623 01/16/14 1818 01/17/14 0253 01/18/14 0548  WBC 11.1* 9.0 7.9 8.0  HGB 10.2* 9.2* 8.6* 8.4*  HCT 30.0* 27.4* 25.5* 25.8*  PLT 296 239 218 244  MCV 81.3 81.3 81.2 81.9  MCH 27.6 27.3 27.4 26.7  MCHC 34.0 33.6 33.7 32.6  RDW 13.7 13.8 13.9 14.1  LYMPHSABS  --  1.2  --   --   MONOABS  --  0.6  --   --   EOSABS  --  0.1  --   --   BASOSABS  --  0.0  --   --     Chemistries   Recent Labs Lab 01/16/14 1623 01/17/14 0253 01/18/14 0548  NA 131* 132* 132*  K 4.7 4.2 4.2  CL 93* 97 96  CO2 26 23 25   GLUCOSE 191* 142* 192*  BUN 17 16 17   CREATININE 1.19 1.12 1.27  CALCIUM 9.4 8.6 8.3*    CBG:  Recent Labs Lab 01/17/14 0954 01/17/14 1244 01/17/14 1721 01/17/14 2002 01/18/14 0747  GLUCAP 109* 102* 187* 293* 163*    GFR Estimated Creatinine Clearance: 51.1 ml/min (by C-G formula based on Cr of 1.27).  Coagulation profile No results found for this basename: INR, PROTIME,  in the last 168 hours  Cardiac Enzymes No results found for this basename: CK, CKMB, TROPONINI, MYOGLOBIN,  in the last 168 hours  No components found with this basename: POCBNP,  No results found for this basename: DDIMER,  in the last 72 hours  Recent Labs  01/16/14 2043  HGBA1C 7.9*   No results found for this basename: CHOL, HDL, LDLCALC, TRIG, CHOLHDL, LDLDIRECT,  in the last 72 hours No results found for this basename: TSH, T4TOTAL, FREET3, T3FREE, THYROIDAB,  in the last 72 hours No results found for this basename: VITAMINB12, FOLATE, FERRITIN, TIBC,  IRON, RETICCTPCT,  in the last 72 hours No results found for this basename: LIPASE, AMYLASE,  in the last 72 hours  Urine Studies No results found for this basename: UACOL, UAPR, USPG, UPH, UTP, UGL, UKET, UBIL, UHGB, UNIT, UROB, ULEU, UEPI, UWBC, URBC, UBAC, CAST, CRYS, UCOM, BILUA,  in the last 72 hours  MICROBIOLOGY: Recent Results (  from the past 240 hour(s))  CULTURE, BLOOD (ROUTINE X 2)     Status: None   Collection Time    01/16/14  4:44 PM      Result Value Ref Range Status   Specimen Description BLOOD ARM LEFT   Final   Special Requests BOTTLES DRAWN AEROBIC AND ANAEROBIC 5CC   Final   Culture  Setup Time     Final   Value: 01/16/2014 22:48     Performed at [facility]   Culture     Final   Value:        BLOOD CULTURE RECEIVED NO GROWTH TO DATE CULTURE WILL BE HELD FOR 5 DAYS BEFORE ISSUING A FINAL NEGATIVE REPORT     Performed at [facility]   Report Status PENDING   Incomplete  CULTURE, BLOOD (ROUTINE X 2)     Status: None   Collection Time    01/16/14  4:45 PM      Result Value Ref Range Status   Specimen Description BLOOD ARM RIGHT   Final   Special Requests BOTTLES DRAWN AEROBIC AND ANAEROBIC 10CC   Final   Culture  Setup Time     Final   Value: 01/16/2014 22:48     Performed at [facility]   Culture     Final   Value:        BLOOD CULTURE RECEIVED NO GROWTH TO DATE CULTURE WILL BE HELD FOR 5 DAYS BEFORE ISSUING A FINAL NEGATIVE REPORT     Performed at [facility]   Report Status PENDING   Incomplete  URINE CULTURE     Status: None   Collection Time    01/16/14  8:42 PM      Result Value Ref Range Status   Specimen Description URINE, RANDOM   Final   Special Requests NONE   Final   Culture  Setup Time     Final   Value: 01/17/2014 01:19     Performed at [facility]     Final   Value: 65,000 COLONIES/ML     Performed at [facility]   Culture     Final   Value: Multiple bacterial morphotypes  present, none predominant. Suggest appropriate recollection if clinically indicated.     Performed at [facility]   Report Status 01/17/2014 FINAL   Final  MRSA PCR SCREENING     Status: None   Collection Time    01/16/14 10:26 PM      Result Value Ref Range Status   MRSA by PCR NEGATIVE  NEGATIVE Final   Comment:            The GeneXpert MRSA Assay (FDA     approved for NASAL specimens     only), is one component of a     comprehensive MRSA colonization     surveillance program. It is not     intended to diagnose MRSA     infection nor to guide or     monitor treatment for     MRSA infections.  SURGICAL PCR SCREEN     Status: None   Collection Time    01/17/14  9:40 AM      Result Value Ref Range Status   MRSA, PCR NEGATIVE  NEGATIVE Final   Staphylococcus aureus NEGATIVE  NEGATIVE Final   Comment:            The Xpert SA  Assay (FDA     approved for NASAL specimens     in patients over 7 years of age),     is one component of     a comprehensive surveillance     program.  Test performance has     been validated by [employer] for patients greater     than or equal to 67 year old.     It is not intended     to diagnose infection nor to     guide or monitor treatment.    RADIOLOGY STUDIES/RESULTS: Dg Os Calcis Right  01/16/2014   CLINICAL DATA:  Large open wound over the calcaneus.  EXAM: RIGHT OS CALCIS - 2+ VIEW  COMPARISON:  None.  FINDINGS: There is a large soft tissue wound along the lateral margin calcaneus. Underlying bony destructive change in the posterior and lateral calcaneus is consistent with osteomyelitis. No radiopaque foreign body is identified. Vascular calcifications are noted.  IMPRESSION: Large skin ulceration over the lateral calcaneus with underlying osteomyelitis.   Electronically Signed   By: Inge Rise M.D.   On: 01/16/2014 17:42   Ct Foot Right W Contrast  12/25/2013   CLINICAL DATA:  Osteomyelitis.  EXAM: CT OF THE RIGHT FOOT  WITH CONTRAST  TECHNIQUE: Multidetector CT imaging was performed following the standard protocol during bolus administration of intravenous contrast.  CONTRAST:  149mL OMNIPAQUE IOHEXOL 300 MG/ML  SOLN  COMPARISON:  DG OS CALCIS*R* dated 12/12/2013  FINDINGS: There is an ulcer along the lateral aspect of the calcaneus, extending to the cortical surface. There is rare fraction of bone and cortical osteolysis compatible with active osteomyelitis in a lateral calcaneus deep to the ulcer. Gas extends through the ulcer tract to the bone surface. There midfoot erosions in the medial cuneiform bone which may be associated with the patient's history of gout arthritis or ordinary degenerative disease. First MTP joint osteoarthritis is mild. Old avulsion fragments are present adjacent to the medial malleolus in the deltoid ligament. Calcification of the posterior talofibular ligament is incidentally noted. No soft tissue abscess is identified. Grossly, the Achilles tendon and plantar fascia appear within normal limits.  IMPRESSION: Lateral hindfoot ulceration with osteomyelitis of the lateral calcaneal tuberosity (image 28 series 3). No soft tissue abscess.   Electronically Signed   By: Dereck Ligas M.D.   On: 12/25/2013 15:46    Oren Binet, MD  [facility] Pager:[phone]  If 7PM-7AM, please contact night-coverage [email or web address] Password [facility] 01/18/2014, 11:36 AM   LOS: 2 days

## 2014-01-18 NOTE — Evaluation (Signed)
Physical Therapy Evaluation Patient Details Name: [name] MRN: [id number] DOB: [birth year] Today's Date: 01/18/2014 Time: 8250-5397 PT Time Calculation (min): 39 min  PT Assessment / Plan / Recommendation History of Present Illness  Patient was admitted, initially refused to undergo a BKA, plastics took the patient to the OR on 2/27 for wound debridement and bone biopsy. Postoperatively, patient changed his mind and now was agreeable to undergo a right BKA. Dr. Sharol Given has been contacted, current plans are for BKA on Monday. In the interim, continue with vancomycin and Zosyn. Per Dr. Sharol Given, okay to continue with Eliquis.   Clinical Impression  Pt presents after I and D right foot in preparation for BKA. Pt with LLE grossly 4/5 and LUE WFL but R wrist, elbow, and shoulder painful and will make transfers and ambulation very difficult after BKA. Practiced sit to stand and pivot transfer relying only on UE's and LLE and educated pt on amputee precautions and exercises. Will follow after surgery to improve balance and strength to maximize independence and safety. Recommend ST-SNF for rehab after amputation, pt requests [facility] as he has been there before.    PT Assessment  Patient needs continued PT services    Follow Up Recommendations  SNF;Supervision/Assistance - 24 hour    Does the patient have the potential to tolerate intense rehabilitation      Barriers to Discharge Decreased caregiver support      Equipment Recommendations  Rolling walker with 5" wheels    Recommendations for Other Services     Frequency Min 3X/week    Precautions / Restrictions Precautions Precautions: Fall Restrictions Weight Bearing Restrictions: Yes RLE Weight Bearing: Non weight bearing   Pertinent Vitals/Pain VSS      Mobility  Bed Mobility Overal bed mobility: Needs Assistance Bed Mobility: Supine to Sit Supine to sit: Supervision General bed mobility comments: supervision and mgmt of  wound vac line Transfers Overall transfer level: Needs assistance Equipment used: Rolling walker (2 wheeled) Transfers: Sit to/from [facility] Sit to Stand: Min assist Stand pivot transfers: Min assist General transfer comment: practiced standing 3x to work on sit to stand relying only on LLE and UE's. Pt limited by RUE pain and inability to take wt fully through this UE. Therapist positioned foot under pt's right foot to prevent [place] and pt was able to achieve standing with min A.  Ambulation/Gait Ambulation/Gait assistance: Min assist Ambulation Distance (Feet): 2 Feet Assistive device: Rolling walker (2 wheeled) Gait Pattern/deviations: Step-to pattern General Gait Details: pivot steps to chair, pt having difficulty maintaining NWB LLE, expect hopping will be hard after surgery.     Exercises Other Exercises Other Exercises: discussed amputee exercises and main goals in preparing for prosthesis as far as maintaining full ROM and preserving strength   PT Diagnosis: Difficulty walking  PT Problem List: Decreased strength;Decreased balance;Decreased mobility;Decreased knowledge of use of DME;Decreased knowledge of precautions PT Treatment Interventions: DME instruction;Gait training;Functional mobility training;Therapeutic activities;Therapeutic exercise;Balance training;Patient/family education;Neuromuscular re-education     PT Goals(Current goals can be found in the care plan section) Acute Rehab PT Goals Patient Stated Goal: rehab and then home PT Goal Formulation: With patient Time For Goal Achievement: 02/01/14 Potential to Achieve Goals: Good  Visit Information  Last PT Received On: 01/18/14 Assistance Needed: +2 (after BKA) History of Present Illness: Patient was admitted, initially refused to undergo a BKA, plastics took the patient to the OR on 2/27 for wound debridement and bone biopsy. Postoperatively, patient changed his mind  and now was agreeable to  undergo a right BKA. Dr. Sharol Given has been contacted, current plans are for BKA on Monday. In the interim, continue with vancomycin and Zosyn. Per Dr. Sharol Given, okay to continue with Eliquis.        Prior [place] expects to be discharged to:: Private residence Living Arrangements: Spouse/significant other Available Help at Discharge: Family;Available PRN/intermittently Type of Home: Apartment Home Access: Level entry;Elevator Home Layout: One level Home Equipment: None Prior Function Level of Independence: Independent Comments: h/o falls Communication Communication: No difficulties Dominant Hand: Right    Cognition  Cognition Arousal/Alertness: Awake/alert Behavior During Therapy: WFL for tasks assessed/performed Overall Cognitive Status: Within Functional Limits for tasks assessed    Extremity/Trunk Assessment Upper Extremity Assessment Upper Extremity Assessment: Defer to OT evaluation;RUE deficits/detail RUE Deficits / Details: pt with swelling and redness of right wrist, swelling right elbow and c/o right shoulder pain. PROM right shoulder flexoin above 90 degress, unable to achieve this actively RUE: Unable to fully assess due to pain Lower Extremity Assessment Lower Extremity Assessment: RLE deficits/detail;LLE deficits/detail RLE Deficits / Details: hip and knee WFL, ankle not assessed due to dressing and wound vac on foot LLE Deficits / Details: grossly 4/5 though question whether it will be sufficient for unilateral hopping Cervical / Trunk Assessment Cervical / Trunk Assessment: Normal   Balance Balance Overall balance assessment: Needs assistance Sitting-balance support: Single extremity supported;Feet supported Sitting balance-Leahy Scale: Good Standing balance support: Bilateral upper extremity supported;During functional activity Standing balance-Leahy Scale: Poor Standing balance comment: worked on balancing on LLE only with use of RW   End of Session PT - End of Session Equipment Utilized During Treatment: Gait belt Activity Tolerance: Patient tolerated treatment well Patient left: in chair;with call bell/phone within reach Nurse Communication: Mobility status  GP   Leighton Roach, [place]  [place], [place] 01/18/2014, 2:34 PM

## 2014-01-18 NOTE — Progress Notes (Signed)
5701 pt requested medication "lotion" for arm pain. MD notified and aware. Warm pack and prn pain medication given.

## 2014-01-19 DIAGNOSIS — B951 Streptococcus, group B, as the cause of diseases classified elsewhere: Secondary | ICD-10-CM

## 2014-01-19 DIAGNOSIS — I10 Essential (primary) hypertension: Secondary | ICD-10-CM

## 2014-01-19 LAB — GLUCOSE, CAPILLARY
GLUCOSE-CAPILLARY: 217 mg/dL — AB (ref 70–99)
Glucose-Capillary: 172 mg/dL — ABNORMAL HIGH (ref 70–99)
Glucose-Capillary: 196 mg/dL — ABNORMAL HIGH (ref 70–99)
Glucose-Capillary: 229 mg/dL — ABNORMAL HIGH (ref 70–99)
Glucose-Capillary: 215 mg/dL — ABNORMAL HIGH (ref 70–99)

## 2014-01-19 LAB — TISSUE CULTURE

## 2014-01-19 MED ORDER — WHITE PETROLATUM GEL
Status: AC
  Administered 2014-01-19: 0.2 via NASAL
  Filled 2014-01-19: qty 5

## 2014-01-19 NOTE — Progress Notes (Signed)
RN spoke with PA-C Brain Petrarca who gave verbal order to obtain consent authorizing Dr. Sharol Given to perform a right BKA for the reason of osteomyelitis.

## 2014-01-19 NOTE — Progress Notes (Signed)
Vancomycin and zosyn per pharmacy  Infectious Disease: Osteo. WBC 8. Missed one dose of vancomycin am of 02/27. ID said to continue ABX  02/26: Vanc >> 02/26: Zosyn >>  02/26: Blood cx >> NGTD 02/26: Ucx >> 60,000 multiple species 02/27: tissue cx >> group b strep  Nephrology: SCr 1.27 (CrCl ~51); other lytes good; UOP 0.4 ml/kg/hr   Plan:   Cont Vanc 750mg  IV q12  Level at steady state if cont on Monday.  Can probably de-escalate antibiotic  Zosyn 3.375g IV q8 (4hr infusion)

## 2014-01-19 NOTE — Progress Notes (Signed)
Clinical Social Work Department CLINICAL SOCIAL WORK PLACEMENT NOTE 01/19/2014  Patient:  [name], [name]  Account Number:  [id number] Admit date:  01/16/2014  Clinical Social Worker:  Berton Mount, Latanya Presser  Date/time:  01/19/2014 04:00 PM  Clinical Social Work is seeking post-discharge placement for this patient at the following level of care:   [name]   (*CSW will update this form in Epic as items are completed)   01/19/2014  Patient/family provided with [facility] Department of Clinical Social Work's list of facilities offering this level of care within the geographic area requested by the patient (or if unable, by the patient's family).  01/19/2014  Patient/family informed of their freedom to choose among providers that offer the needed level of care, that participate in Medicare, Medicaid or managed care program needed by the patient, have an available bed and are willing to accept the patient.  01/19/2014  Patient/family informed of [facility]' ownership interest in [facility], as well as of the fact that they are under no obligation to receive care at this facility.  PASARR submitted to EDS on Existing  PASARR number received from EDS on   FL2 transmitted to all facilities in geographic area requested by pt/family on  01/19/2014 FL2 transmitted to all facilities within larger geographic area on   Patient informed that his/her managed care company has contracts with or will negotiate with  certain facilities, including the following:     Patient/family informed of bed offers received:   Patient chooses bed at  Physician recommends and patient chooses bed at    Patient to be transferred to  on   Patient to be transferred to facility by   The following physician request were entered in Epic:   Additional CommentsBerton Mount, Johnson

## 2014-01-19 NOTE — Progress Notes (Signed)
PATIENT DETAILS Name: [name] Age: 79 y.o. Sex: male Date of Birth: 29-Jan-1935 Admit Date: 01/16/2014 Admitting Physician Theodis Blaze, MD VC:[id number] EDWARD, MD  Subjective: No major events overnight.  Assessment/Plan: Osteomyelitis of the right calcaneus - Patient was admitted, initially refused to undergo a BKA, plastics took the patient to the OR on 2/27 for wound debridement and bone biopsy. Postoperatively, patient changed his mind and now was agreeable to undergo a right BKA. Dr. Sharol Given has been contacted, current plans are for BKA on Monday. In the interim, continue with vancomycin and Zosyn. Per Dr. Sharol Given, okay to continue with Eliquis-however will hold for today and resume post operatively  Atrial fibrillation - Continue with Eliquis - per Dr. Sharol Given, okay to continue with Eliquis for BKA-however will hold today's dose and then continue post operatively - Continue digoxin, Cardizem  Diabetes - CBGs stable - Continue with SSI and Lantus - Hold metformin while inpatient  Chronic diastolic failure - Clinically compensated - Continue Lasix at half of home dose  Disposition: Remain inpatient  DVT Prophylaxis: Not needed as an Eliquis  Code Status: Full code   Family Communication None at bedside  Procedures:  None  CONSULTS:  ID, orthopedic surgery and Plastics   MEDICATIONS: Scheduled Meds: . digoxin  0.125 mg Oral Daily  . diltiazem  180 mg Oral Daily  . furosemide  40 mg Oral Daily  . insulin aspart  0-5 Units Subcutaneous QHS  . insulin aspart  0-9 Units Subcutaneous TID WC  . insulin glargine  10 Units Subcutaneous QHS  . pantoprazole  40 mg Oral Daily  . piperacillin-tazobactam (ZOSYN)  IV  3.375 g Intravenous Q8H  . vancomycin  750 mg Intravenous Q12H   Continuous Infusions:  PRN Meds:.HYDROmorphone (DILAUDID) injection, ibuprofen, ondansetron (ZOFRAN) IV, ondansetron, oxyCODONE, zolpidem  Antibiotics: Anti-infectives   Start     Dose/Rate Route Frequency Ordered Stop   01/17/14 1600  piperacillin-tazobactam (ZOSYN) IVPB 3.375 g     3.375 g 12.5 mL/hr over 240 Minutes Intravenous Every 8 hours 01/17/14 1335     01/17/14 1500  vancomycin (VANCOCIN) IVPB 750 mg/150 ml premix     750 mg 150 mL/hr over 60 Minutes Intravenous Every 12 hours 01/17/14 1335     01/17/14 1218  polymyxin B 500,000 Units, bacitracin 50,000 Units in sodium chloride irrigation 0.9 % 500 mL irrigation  Status:  Discontinued       As needed 01/17/14 1218 01/17/14 1237   01/17/14 1100  piperacillin-tazobactam (ZOSYN) IVPB 3.375 g  Status:  Discontinued     3.375 g 100 mL/hr over 30 Minutes Intravenous  Once 01/17/14 1055 01/17/14 1405   01/17/14 0800  vancomycin (VANCOCIN) IVPB 750 mg/150 ml premix  Status:  Discontinued     750 mg 150 mL/hr over 60 Minutes Intravenous Every 12 hours 01/16/14 1657 01/17/14 0820   01/17/14 0000  piperacillin-tazobactam (ZOSYN) IVPB 3.375 g  Status:  Discontinued     3.375 g 12.5 mL/hr over 240 Minutes Intravenous Every 8 hours 01/16/14 1657 01/17/14 0820   01/16/14 1645  piperacillin-tazobactam (ZOSYN) IVPB 3.375 g     3.375 g 100 mL/hr over 30 Minutes Intravenous  Once 01/16/14 1642 01/16/14 1926   01/16/14 1645  vancomycin (VANCOCIN) IVPB 1000 mg/200 mL premix     1,000 mg 200 mL/hr over 60 Minutes Intravenous  Once 01/16/14 1642 01/16/14 1926       PHYSICAL EXAM: Vital signs in  last 24 hours: Filed Vitals:   01/18/14 1417 01/18/14 2044 01/19/14 0500 01/19/14 0935  BP: 136/55 151/73 145/72 146/64  Pulse: 90 79 72 78  Temp: 98.2 F (36.8 C) 98.2 F (36.8 C) 98.2 F (36.8 C)   TempSrc: Oral Oral Oral   Resp: 20 20 20    Height:      Weight:   81.3 kg (179 lb 3.7 oz)   SpO2: 99% 97% 95%     Weight change:  Filed Weights   01/16/14 1958 01/17/14 0500 01/19/14 0500  Weight: 79.6 kg (175 lb 7.8 oz) 78.5 kg (173 lb 1 oz) 81.3 kg (179 lb 3.7 oz)   Body mass index is 25.72 kg/(m^2).   Gen  Exam: Awake and alert with clear speech.   Neck: Supple, No JVD.   Chest: B/L Clear.  No rales or rhonchi CVS: S1 S2 Regular Abdomen: soft, BS +, non tender, non distended.  Extremities: no edema, lower extremities warm to touch. Right leg bandaged with a VAC in place did not open. Neurologic: Non Focal.   Skin: No Rash.   Wounds: [name].   Intake/Output from previous day:  Intake/Output Summary (Last 24 hours) at 01/19/14 1014 Last data filed at 01/19/14 0852  Gross per 24 hour  Intake   1466 ml  Output    965 ml  Net    501 ml     LAB RESULTS: CBC  Recent Labs Lab 01/16/14 1623 01/16/14 1818 01/17/14 0253 01/18/14 0548  WBC 11.1* 9.0 7.9 8.0  HGB 10.2* 9.2* 8.6* 8.4*  HCT 30.0* 27.4* 25.5* 25.8*  PLT 296 239 218 244  MCV 81.3 81.3 81.2 81.9  MCH 27.6 27.3 27.4 26.7  MCHC 34.0 33.6 33.7 32.6  RDW 13.7 13.8 13.9 14.1  LYMPHSABS  --  1.2  --   --   MONOABS  --  0.6  --   --   EOSABS  --  0.1  --   --   BASOSABS  --  0.0  --   --     Chemistries   Recent Labs Lab 01/16/14 1623 01/17/14 0253 01/18/14 0548  NA 131* 132* 132*  K 4.7 4.2 4.2  CL 93* 97 96  CO2 26 23 25   GLUCOSE 191* 142* 192*  BUN 17 16 17   CREATININE 1.19 1.12 1.27  CALCIUM 9.4 8.6 8.3*    CBG:  Recent Labs Lab 01/18/14 0747 01/18/14 1129 01/18/14 1629 01/18/14 2137 01/19/14 0749  GLUCAP 163* 179* 220* 135* 172*    GFR Estimated Creatinine Clearance: 51.1 ml/min (by C-G formula based on Cr of 1.27).  Coagulation profile No results found for this basename: INR, PROTIME,  in the last 168 hours  Cardiac Enzymes No results found for this basename: CK, CKMB, TROPONINI, MYOGLOBIN,  in the last 168 hours  No components found with this basename: POCBNP,  No results found for this basename: DDIMER,  in the last 72 hours  Recent Labs  01/16/14 2043  HGBA1C 7.9*   No results found for this basename: CHOL, HDL, LDLCALC, TRIG, CHOLHDL, LDLDIRECT,  in the last 72 hours No results  found for this basename: TSH, T4TOTAL, FREET3, T3FREE, THYROIDAB,  in the last 72 hours No results found for this basename: VITAMINB12, FOLATE, FERRITIN, TIBC, IRON, RETICCTPCT,  in the last 72 hours No results found for this basename: LIPASE, AMYLASE,  in the last 72 hours  Urine Studies No results found for this basename: UACOL, UAPR, USPG, UPH,  UTP, UGL, UKET, UBIL, UHGB, UNIT, UROB, ULEU, UEPI, UWBC, URBC, UBAC, CAST, CRYS, UCOM, BILUA,  in the last 72 hours  MICROBIOLOGY: Recent Results (from the past 240 hour(s))  CULTURE, BLOOD (ROUTINE X 2)     Status: None   Collection Time    01/16/14  4:44 PM      Result Value Ref Range Status   Specimen Description BLOOD ARM LEFT   Final   Special Requests BOTTLES DRAWN AEROBIC AND ANAEROBIC 5CC   Final   Culture  Setup Time     Final   Value: 01/16/2014 22:48     Performed at [facility]   Culture     Final   Value:        BLOOD CULTURE RECEIVED NO GROWTH TO DATE CULTURE WILL BE HELD FOR 5 DAYS BEFORE ISSUING A FINAL NEGATIVE REPORT     Performed at [facility]   Report Status PENDING   Incomplete  CULTURE, BLOOD (ROUTINE X 2)     Status: None   Collection Time    01/16/14  4:45 PM      Result Value Ref Range Status   Specimen Description BLOOD ARM RIGHT   Final   Special Requests BOTTLES DRAWN AEROBIC AND ANAEROBIC 10CC   Final   Culture  Setup Time     Final   Value: 01/16/2014 22:48     Performed at [facility]   Culture     Final   Value:        BLOOD CULTURE RECEIVED NO GROWTH TO DATE CULTURE WILL BE HELD FOR 5 DAYS BEFORE ISSUING A FINAL NEGATIVE REPORT     Performed at [facility]   Report Status PENDING   Incomplete  URINE CULTURE     Status: None   Collection Time    01/16/14  8:42 PM      Result Value Ref Range Status   Specimen Description URINE, RANDOM   Final   Special Requests NONE   Final   Culture  Setup Time     Final   Value: 01/17/2014 01:19     Performed at [place]     Final   Value: 65,000 COLONIES/ML     Performed at [facility]   Culture     Final   Value: Multiple bacterial morphotypes present, none predominant. Suggest appropriate recollection if clinically indicated.     Performed at [facility]   Report Status 01/17/2014 FINAL   Final  MRSA PCR SCREENING     Status: None   Collection Time    01/16/14 10:26 PM      Result Value Ref Range Status   MRSA by PCR NEGATIVE  NEGATIVE Final   Comment:            The GeneXpert MRSA Assay (FDA     approved for NASAL specimens     only), is one component of a     comprehensive MRSA colonization     surveillance program. It is not     intended to diagnose MRSA     infection nor to guide or     monitor treatment for     MRSA infections.  SURGICAL PCR SCREEN     Status: None   Collection Time    01/17/14  9:40 AM      Result Value Ref Range Status   MRSA, PCR NEGATIVE  NEGATIVE  Final   Staphylococcus aureus NEGATIVE  NEGATIVE Final   Comment:            The Xpert SA Assay (FDA     approved for NASAL specimens     in patients over 47 years of age),     is one component of     a comprehensive surveillance     program.  Test performance has     been validated by [employer] for patients greater     than or equal to 39 year old.     It is not intended     to diagnose infection nor to     guide or monitor treatment.  ANAEROBIC CULTURE     Status: None   Collection Time    01/17/14 12:09 PM      Result Value Ref Range Status   Specimen Description TISSUE BONE RIGHT   Final   Special Requests RIGHT CALCANEOUS BONE PT ON ZOSYN   Final   Gram Stain PENDING   Incomplete   Culture     Final   Value: NO ANAEROBES ISOLATED; CULTURE IN PROGRESS FOR 5 DAYS     Performed at [facility]   Report Status PENDING   Incomplete  TISSUE CULTURE     Status: None   Collection Time    01/17/14 12:09 PM      Result Value Ref Range Status   Specimen  Description TISSUE BONE RIGHT   Final   Special Requests RIGHT CALCANEOUS BONE PT ON ZOSYN   Final   Gram Stain     Final   Value: RARE WBC PRESENT,BOTH PMN AND MONONUCLEAR     FEW GRAM POSITIVE COCCI IN PAIRS     Performed at [facility]   Culture     Final   Value: MODERATE GROUP B STREP(S.AGALACTIAE)ISOLATED     Note: TESTING AGAINST S. AGALACTIAE NOT ROUTINELY PERFORMED DUE TO PREDICTABILITY OF AMP/PEN/VAN SUSCEPTIBILITY.     Performed at [facility]   Report Status 01/19/2014 FINAL   Final    RADIOLOGY STUDIES/RESULTS: Dg Os Calcis Right  01/16/2014   CLINICAL DATA:  Large open wound over the calcaneus.  EXAM: RIGHT OS CALCIS - 2+ VIEW  COMPARISON:  None.  FINDINGS: There is a large soft tissue wound along the lateral margin calcaneus. Underlying bony destructive change in the posterior and lateral calcaneus is consistent with osteomyelitis. No radiopaque foreign body is identified. Vascular calcifications are noted.  IMPRESSION: Large skin ulceration over the lateral calcaneus with underlying osteomyelitis.   Electronically Signed   By: Inge Rise M.D.   On: 01/16/2014 17:42   Ct Foot Right W Contrast  12/25/2013   CLINICAL DATA:  Osteomyelitis.  EXAM: CT OF THE RIGHT FOOT WITH CONTRAST  TECHNIQUE: Multidetector CT imaging was performed following the standard protocol during bolus administration of intravenous contrast.  CONTRAST:  142mL OMNIPAQUE IOHEXOL 300 MG/ML  SOLN  COMPARISON:  DG OS CALCIS*R* dated 12/12/2013  FINDINGS: There is an ulcer along the lateral aspect of the calcaneus, extending to the cortical surface. There is rare fraction of bone and cortical osteolysis compatible with active osteomyelitis in a lateral calcaneus deep to the ulcer. Gas extends through the ulcer tract to the bone surface. There midfoot erosions in the medial cuneiform bone which may be associated with the patient's history of gout arthritis or ordinary degenerative disease. First MTP  joint osteoarthritis is mild. Old  avulsion fragments are present adjacent to the medial malleolus in the deltoid ligament. Calcification of the posterior talofibular ligament is incidentally noted. No soft tissue abscess is identified. Grossly, the Achilles tendon and plantar fascia appear within normal limits.  IMPRESSION: Lateral hindfoot ulceration with osteomyelitis of the lateral calcaneal tuberosity (image 28 series 3). No soft tissue abscess.   Electronically Signed   By: Dereck Ligas M.D.   On: 12/25/2013 15:46    Oren Binet, MD  [facility] Pager:[phone]  If 7PM-7AM, please contact night-coverage [email or web address] Password [email or web address] 01/19/2014, 10:14 AM   LOS: 3 days

## 2014-01-19 NOTE — Progress Notes (Signed)
Patient ID: [name], male   DOB: 22-Dec-1934, 79 y.o.   MRN: [id number]         [facility]    Date of Admission:  01/16/2014   Total days of antibiotics 38        Day 4 vanco        Day 4 piptazo         Active Problems:   Osteomyelitis   Ulcer of heel   . digoxin  0.125 mg Oral Daily  . diltiazem  180 mg Oral Daily  . furosemide  40 mg Oral Daily  . insulin aspart  0-5 Units Subcutaneous QHS  . insulin aspart  0-9 Units Subcutaneous TID WC  . insulin glargine  10 Units Subcutaneous QHS  . pantoprazole  40 mg Oral Daily  . piperacillin-tazobactam (ZOSYN)  IV  3.375 g Intravenous Q8H   ID: [name] is a 79 year old gentleman with diabetes, diabetic neuropathy and a chronic open wound of his right heel. He has calcaneal osteomyelitis with PsA noted on wound culture  on January 22 showed.  He was started on ciprofloxacin 5 weeks ago.  He was admitted yesterday because of severe, increasing pain in his right lower leg associated with increasingly foul odor coming from the wound.POD#2 from debridement of right heel  Subjective:  Afebrile.little discomfort in his right foot. Planned to have R BKA on Monday  Objective: Temp:  [98.2 F (36.8 C)] 98.2 F (36.8 C) (03/01 0500) Pulse Rate:  [72-90] 78 (03/01 0935) Resp:  [20] 20 (03/01 0500) BP: (136-151)/(55-73) 146/64 mmHg (03/01 0935) SpO2:  [95 %-99 %] 95 % (03/01 0500) Weight:  [179 lb 3.7 oz (81.3 kg)] 179 lb 3.7 oz (81.3 kg) (03/01 0500)  General: a x o by 3 in NAD HEENT: NCAT, PERRLA, EOMI, no scleral icterus. No thrush Lungs: Clear Cor: Irregularly irregular S1 and S2 with no murmurs. Left anterior chest pacemaker site is normal Abdomen: Soft and nontender Joints and extremities: right ankle is wrapped with wound vac in place  Lab Results Lab Results  Component Value Date   WBC 8.0 01/18/2014   HGB 8.4* 01/18/2014   HCT 25.8* 01/18/2014   MCV 81.9 01/18/2014   PLT 244 01/18/2014    Lab Results  Component Value Date   CREATININE 1.27 01/18/2014   BUN 17 01/18/2014   NA 132* 01/18/2014   K 4.2 01/18/2014   CL 96 01/18/2014   CO2 25 01/18/2014    Lab Results  Component Value Date   ALT 12 09/30/2013   AST 20 09/30/2013   ALKPHOS 79 09/30/2013   BILITOT 0.8 09/30/2013      Microbiology: Recent Results (from the past 240 hour(s))  CULTURE, BLOOD (ROUTINE X 2)     Status: None   Collection Time    01/16/14  4:44 PM      Result Value Ref Range Status   Specimen Description BLOOD ARM LEFT   Final   Special Requests BOTTLES DRAWN AEROBIC AND ANAEROBIC 5CC   Final   Culture  Setup Time     Final   Value: 01/16/2014 22:48     Performed at [facility]   Culture     Final   Value:        BLOOD CULTURE RECEIVED NO GROWTH TO DATE CULTURE WILL BE HELD FOR 5 DAYS BEFORE ISSUING A FINAL NEGATIVE REPORT     Performed at [facility]  Report Status PENDING   Incomplete  CULTURE, BLOOD (ROUTINE X 2)     Status: None   Collection Time    01/16/14  4:45 PM      Result Value Ref Range Status   Specimen Description BLOOD ARM RIGHT   Final   Special Requests BOTTLES DRAWN AEROBIC AND ANAEROBIC 10CC   Final   Culture  Setup Time     Final   Value: 01/16/2014 22:48     Performed at [facility]   Culture     Final   Value:        BLOOD CULTURE RECEIVED NO GROWTH TO DATE CULTURE WILL BE HELD FOR 5 DAYS BEFORE ISSUING A FINAL NEGATIVE REPORT     Performed at [facility]   Report Status PENDING   Incomplete  URINE CULTURE     Status: None   Collection Time    01/16/14  8:42 PM      Result Value Ref Range Status   Specimen Description URINE, RANDOM   Final   Special Requests NONE   Final   Culture  Setup Time     Final   Value: 01/17/2014 01:19     Performed at [place]     Final   Value: 65,000 COLONIES/ML     Performed at [facility]   Culture     Final   Value: Multiple bacterial morphotypes  present, none predominant. Suggest appropriate recollection if clinically indicated.     Performed at [facility]   Report Status 01/17/2014 FINAL   Final  MRSA PCR SCREENING     Status: None   Collection Time    01/16/14 10:26 PM      Result Value Ref Range Status   MRSA by PCR NEGATIVE  NEGATIVE Final   Comment:            The GeneXpert MRSA Assay (FDA     approved for NASAL specimens     only), is one component of a     comprehensive MRSA colonization     surveillance program. It is not     intended to diagnose MRSA     infection nor to guide or     monitor treatment for     MRSA infections.  SURGICAL PCR SCREEN     Status: None   Collection Time    01/17/14  9:40 AM      Result Value Ref Range Status   MRSA, PCR NEGATIVE  NEGATIVE Final   Staphylococcus aureus NEGATIVE  NEGATIVE Final   Comment:            The Xpert SA Assay (FDA     approved for NASAL specimens     in patients over 68 years of age),     is one component of     a comprehensive surveillance     program.  Test performance has     been validated by [employer] for patients greater     than or equal to 26 year old.     It is not intended     to diagnose infection nor to     guide or monitor treatment.  ANAEROBIC CULTURE     Status: None   Collection Time    01/17/14 12:09 PM      Result Value Ref Range Status   Specimen Description TISSUE BONE RIGHT  Final   Special Requests RIGHT CALCANEOUS BONE PT ON ZOSYN   Final   Gram Stain PENDING   Incomplete   Culture     Final   Value: NO ANAEROBES ISOLATED; CULTURE IN PROGRESS FOR 5 DAYS     Performed at [facility]   Report Status PENDING   Incomplete  TISSUE CULTURE     Status: None   Collection Time    01/17/14 12:09 PM      Result Value Ref Range Status   Specimen Description TISSUE BONE RIGHT   Final   Special Requests RIGHT CALCANEOUS BONE PT ON ZOSYN   Final   Gram Stain     Final   Value: RARE WBC PRESENT,BOTH PMN AND  MONONUCLEAR     FEW GRAM POSITIVE COCCI IN PAIRS     Performed at [facility]   Culture     Final   Value: MODERATE GROUP B STREP(S.AGALACTIAE)ISOLATED     Note: TESTING AGAINST S. AGALACTIAE NOT ROUTINELY PERFORMED DUE TO PREDICTABILITY OF AMP/PEN/VAN SUSCEPTIBILITY.     Performed at [facility]   Report Status 01/19/2014 FINAL   Final    Studies/Results: Dg Wrist Complete Right  01/18/2014   CLINICAL DATA:  [place] several days ago. Right wrist pain and swelling.  EXAM: RIGHT WRIST - COMPLETE 3+ VIEW  COMPARISON:  None.  FINDINGS: No acute fracture. There is an old ulnar styloid fracture. Soft tissue calcifications are seen surrounding the wrist joints. This suggest chondrocalcinosis. There is joint space narrowing and subchondral sclerosis with adjacent soft tissue calcification at the first carpal metacarpal articulation. The bones are diffusely demineralized.  There is mild soft tissue swelling predominates along the ulnar aspect of the wrist.  IMPRESSION: No acute fracture or dislocation.   Electronically Signed   By: Lajean Manes M.D.   On: 01/18/2014 19:59   CT OF THE RIGHT FOOT WITH CONTRAST 12/25/2013   IMPRESSION:  Lateral hindfoot ulceration with osteomyelitis of the lateral  calcaneal tuberosity (image 28 series 3). No soft tissue abscess.    By: Dereck Ligas M.D.  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 OR cx showing Group B strep  Plan: 1. Group B strep deep tissue infection/osteomyelitis = will continue with piptazo alone and d/c vanco 2. Likely polymicrobial and still need PsA coverage at this point 3. After BKA can likely due mop up with oral antibiotics x 5 days  Carlyle Basques, [place] for [place] [phone] pager  [phone]  cell 01/19/2014, 11:02 AM

## 2014-01-19 NOTE — Progress Notes (Signed)
Clinical Social Work Department BRIEF PSYCHOSOCIAL ASSESSMENT 01/19/2014  Patient:  [name], [name]     Account Number:  [id number]     Admit date:  01/16/2014  Clinical Social Worker:  Adair Laundry  Date/Time:  01/19/2014 03:00 PM  Referred by:  Physician  Date Referred:  01/19/2014 Referred for  SNF Placement   Other Referral:   Interview type:  Patient Other interview type:   Spoke with pt and pt wife at bedsid    PSYCHOSOCIAL DATA Living Status:  WIFE Admitted from facility:   Level of care:   Primary support name:  Kasper Mudrick [phone] Primary support relationship to patient:  SPOUSE Degree of support available:   Pt has supportive family    CURRENT CONCERNS Current Concerns  Post-Acute Placement   Other Concerns:    SOCIAL WORK ASSESSMENT / PLAN CSW spoke with pt and pt wife at bedside about PT recommendation. Pt and pt wife already aware of recommendation and in agreement that ST rehab is what will be best for pt. Pt and pt wife hoping for pt to be able to dc to [facility]. Pt lives right behind the facility and has been there before. Pt reported having excellent expereince at the facility and wants to return for rehab this time as well. Pt and pt wife open to pt being faxed out to other facilities only if [facility] is unable to offer a bed.   Assessment/plan status:  Psychosocial Support/Ongoing Assessment of Needs Other assessment/ plan:   Information/referral to community resources:   SNF list denied at this time    PATIENT'S/FAMILY'S RESPONSE TO PLAN OF CARE: Pt and pt wife in agreement that ST rehab will be [facility] [place], [place]

## 2014-01-20 ENCOUNTER — Inpatient Hospital Stay (HOSPITAL_COMMUNITY): Payer: Medicare Other | Admitting: Anesthesiology

## 2014-01-20 ENCOUNTER — Encounter (HOSPITAL_COMMUNITY)
Admission: EM | Disposition: A | Discharge status: Skilled Nursing Facility | Payer: Self-pay | Source: Home / Self Care | Attending: Internal Medicine

## 2014-01-20 ENCOUNTER — Encounter (HOSPITAL_COMMUNITY): Payer: Self-pay | Admitting: Certified Registered"

## 2014-01-20 ENCOUNTER — Encounter (HOSPITAL_BASED_OUTPATIENT_CLINIC_OR_DEPARTMENT_OTHER): Payer: Medicare Other

## 2014-01-20 ENCOUNTER — Encounter (HOSPITAL_COMMUNITY): Payer: Medicare Other | Admitting: Anesthesiology

## 2014-01-20 DIAGNOSIS — S88119A Complete traumatic amputation at level between knee and ankle, unspecified lower leg, initial encounter: Secondary | ICD-10-CM

## 2014-01-20 HISTORY — PX: AMPUTATION: SHX166

## 2014-01-20 LAB — COMPREHENSIVE METABOLIC PANEL
ALT: 17 U/L (ref 0–53)
AST: 25 U/L (ref 0–37)
Albumin: 2.1 g/dL — ABNORMAL LOW (ref 3.5–5.2)
Alkaline Phosphatase: 137 U/L — ABNORMAL HIGH (ref 39–117)
BUN: 22 mg/dL (ref 6–23)
CALCIUM: 8.5 mg/dL (ref 8.4–10.5)
CO2: 26 mEq/L (ref 19–32)
Chloride: 99 mEq/L (ref 96–112)
Creatinine, Ser: 1.14 mg/dL (ref 0.50–1.35)
GFR calc non Af Amer: 61 mL/min — ABNORMAL LOW (ref >90)
GFR, EST AFRICAN AMERICAN: 70 mL/min — AB (ref >90)
Glucose, Bld: 162 mg/dL — ABNORMAL HIGH (ref 70–99)
Potassium: 4.2 mEq/L (ref 3.7–5.3)
Sodium: 136 mEq/L — ABNORMAL LOW (ref 137–147)
TOTAL PROTEIN: 6.5 g/dL (ref 6.0–8.3)
Total Bilirubin: 0.4 mg/dL (ref 0.3–1.2)

## 2014-01-20 LAB — GLUCOSE, CAPILLARY
GLUCOSE-CAPILLARY: 159 mg/dL — AB (ref 70–99)
GLUCOSE-CAPILLARY: 169 mg/dL — AB (ref 70–99)
Glucose-Capillary: 156 mg/dL — ABNORMAL HIGH (ref 70–99)
Glucose-Capillary: 153 mg/dL — ABNORMAL HIGH (ref 70–99)
Glucose-Capillary: 176 mg/dL — ABNORMAL HIGH (ref 70–99)

## 2014-01-20 LAB — CBC WITH DIFFERENTIAL/PLATELET
Basophils Absolute: 0 10*3/uL (ref 0.0–0.1)
Basophils Relative: 0 % (ref 0–1)
EOS ABS: 0.2 10*3/uL (ref 0.0–0.7)
EOS PCT: 3 % (ref 0–5)
HEMATOCRIT: 24.8 % — AB (ref 39.0–52.0)
HEMOGLOBIN: 8.4 g/dL — AB (ref 13.0–17.0)
LYMPHS ABS: 1.3 10*3/uL (ref 0.7–4.0)
Lymphocytes Relative: 22 % (ref 12–46)
MCH: 27.9 pg (ref 26.0–34.0)
MCHC: 33.9 g/dL (ref 30.0–36.0)
MCV: 82.4 fL (ref 78.0–100.0)
MONO ABS: 0.4 10*3/uL (ref 0.1–1.0)
MONOS PCT: 8 % (ref 3–12)
Neutro Abs: 3.7 10*3/uL (ref 1.7–7.7)
Neutrophils Relative %: 66 % (ref 43–77)
PLATELETS: 214 10*3/uL (ref 150–400)
RBC: 3.01 MIL/uL — AB (ref 4.22–5.81)
RDW: 14.1 % (ref 11.5–15.5)
WBC: 5.6 10*3/uL (ref 4.0–10.5)

## 2014-01-20 LAB — PREPARE RBC (CROSSMATCH)

## 2014-01-20 SURGERY — AMPUTATION BELOW KNEE
Anesthesia: General | Site: Leg Lower | Laterality: Right

## 2014-01-20 MED ORDER — FENTANYL CITRATE 0.05 MG/ML IJ SOLN
INTRAMUSCULAR | Status: AC
  Filled 2014-01-20: qty 5

## 2014-01-20 MED ORDER — CEFAZOLIN SODIUM-DEXTROSE 2-3 GM-% IV SOLR
INTRAVENOUS | Status: AC
  Filled 2014-01-20: qty 50

## 2014-01-20 MED ORDER — OXYCODONE HCL 5 MG PO TABS: 5.0000 mg | ORAL_TABLET | Freq: Once | ORAL | Status: DC | PRN

## 2014-01-20 MED ORDER — PROMETHAZINE HCL 25 MG/ML IJ SOLN: 6.2500 mg | INTRAMUSCULAR | Status: DC | PRN

## 2014-01-20 MED ORDER — MEPERIDINE HCL 25 MG/ML IJ SOLN: 6.2500 mg | INTRAMUSCULAR | Status: DC | PRN

## 2014-01-20 MED ORDER — LIDOCAINE HCL (CARDIAC) 20 MG/ML IV SOLN
INTRAVENOUS | Status: AC
  Filled 2014-01-20: qty 5

## 2014-01-20 MED ORDER — LIDOCAINE HCL (CARDIAC) 20 MG/ML IV SOLN
INTRAVENOUS | Status: DC | PRN
  Administered 2014-01-20: 30 mg via INTRAVENOUS

## 2014-01-20 MED ORDER — HYDROMORPHONE HCL PF 1 MG/ML IJ SOLN
INTRAMUSCULAR | Status: AC
  Filled 2014-01-20: qty 1

## 2014-01-20 MED ORDER — APIXABAN 5 MG PO TABS
5.0000 mg | ORAL_TABLET | Freq: Two times a day (BID) | ORAL | Status: DC
  Administered 2014-01-20 – 2014-01-21 (×2): 5 mg via ORAL
  Filled 2014-01-20 (×3): qty 1

## 2014-01-20 MED ORDER — LACTATED RINGERS IV SOLN
INTRAVENOUS | Status: DC | PRN
  Administered 2014-01-20: 07:00:00 via INTRAVENOUS

## 2014-01-20 MED ORDER — HYDROMORPHONE HCL PF 1 MG/ML IJ SOLN
0.5000 mg | INTRAMUSCULAR | Status: DC | PRN
  Administered 2014-01-20 – 2014-01-22 (×7): 1 mg via INTRAVENOUS
  Filled 2014-01-20 (×8): qty 1

## 2014-01-20 MED ORDER — 0.9 % SODIUM CHLORIDE (POUR BTL) OPTIME
TOPICAL | Status: DC | PRN
  Administered 2014-01-20: 1000 mL

## 2014-01-20 MED ORDER — ONDANSETRON HCL 4 MG/2ML IJ SOLN: 4.0000 mg | Freq: Four times a day (QID) | INTRAMUSCULAR | Status: DC | PRN

## 2014-01-20 MED ORDER — METHOCARBAMOL 100 MG/ML IJ SOLN
500.0000 mg | Freq: Four times a day (QID) | INTRAVENOUS | Status: DC | PRN
  Filled 2014-01-20: qty 5

## 2014-01-20 MED ORDER — OXYCODONE HCL 5 MG/5ML PO SOLN: 5.0000 mg | Freq: Once | ORAL | Status: DC | PRN

## 2014-01-20 MED ORDER — CEFAZOLIN SODIUM 1-5 GM-% IV SOLN
1.0000 g | Freq: Four times a day (QID) | INTRAVENOUS | Status: AC
  Administered 2014-01-20 – 2014-01-21 (×3): 1 g via INTRAVENOUS
  Filled 2014-01-20 (×3): qty 50

## 2014-01-20 MED ORDER — METHOCARBAMOL 500 MG PO TABS
500.0000 mg | ORAL_TABLET | Freq: Four times a day (QID) | ORAL | Status: DC | PRN
  Filled 2014-01-20: qty 1

## 2014-01-20 MED ORDER — OXYCODONE-ACETAMINOPHEN 5-325 MG PO TABS
1.0000 | ORAL_TABLET | ORAL | Status: DC | PRN
  Administered 2014-01-20 – 2014-01-22 (×6): 2 via ORAL
  Filled 2014-01-20 (×6): qty 2

## 2014-01-20 MED ORDER — CEFAZOLIN SODIUM-DEXTROSE 2-3 GM-% IV SOLR
INTRAVENOUS | Status: DC | PRN
  Administered 2014-01-20: 2 g via INTRAVENOUS

## 2014-01-20 MED ORDER — PROPOFOL 10 MG/ML IV BOLUS
INTRAVENOUS | Status: AC
  Filled 2014-01-20: qty 20

## 2014-01-20 MED ORDER — METOCLOPRAMIDE HCL 5 MG/ML IJ SOLN
5.0000 mg | Freq: Three times a day (TID) | INTRAMUSCULAR | Status: DC | PRN
  Filled 2014-01-20: qty 2

## 2014-01-20 MED ORDER — METHOCARBAMOL 500 MG PO TABS
ORAL_TABLET | ORAL | Status: AC
  Administered 2014-01-20: 500 mg
  Filled 2014-01-20: qty 1

## 2014-01-20 MED ORDER — SODIUM CHLORIDE 0.9 % IV SOLN
INTRAVENOUS | Status: DC
  Administered 2014-01-20: 1000 mL via INTRAVENOUS

## 2014-01-20 MED ORDER — PROPOFOL 10 MG/ML IV BOLUS
INTRAVENOUS | Status: DC | PRN
  Administered 2014-01-20: 120 mg via INTRAVENOUS

## 2014-01-20 MED ORDER — ONDANSETRON HCL 4 MG PO TABS: 4.0000 mg | ORAL_TABLET | Freq: Four times a day (QID) | ORAL | Status: DC | PRN

## 2014-01-20 MED ORDER — HYDROMORPHONE HCL PF 1 MG/ML IJ SOLN
0.2500 mg | INTRAMUSCULAR | Status: DC | PRN
  Administered 2014-01-20 (×2): 0.5 mg via INTRAVENOUS
  Administered 2014-01-20: 0.25 mg via INTRAVENOUS

## 2014-01-20 MED ORDER — METOCLOPRAMIDE HCL 5 MG PO TABS
5.0000 mg | ORAL_TABLET | Freq: Three times a day (TID) | ORAL | Status: DC | PRN
  Filled 2014-01-20: qty 2

## 2014-01-20 MED ORDER — FENTANYL CITRATE 0.05 MG/ML IJ SOLN
INTRAMUSCULAR | Status: DC | PRN
  Administered 2014-01-20 (×2): 100 ug via INTRAVENOUS
  Administered 2014-01-20: 50 ug via INTRAVENOUS

## 2014-01-20 SURGICAL SUPPLY — 43 items
BANDAGE ESMARK 6X9 LF (GAUZE/BANDAGES/DRESSINGS) ×1 IMPLANT
BANDAGE GAUZE ELAST BULKY 4 IN (GAUZE/BANDAGES/DRESSINGS) ×3 IMPLANT
BLADE SAW RECIP 87.9 MT (BLADE) ×3 IMPLANT
BLADE SURG 21 STRL SS (BLADE) ×3 IMPLANT
BNDG COHESIVE 6X5 TAN STRL LF (GAUZE/BANDAGES/DRESSINGS) ×3 IMPLANT
BNDG ESMARK 6X9 LF (GAUZE/BANDAGES/DRESSINGS) ×3
COVER SURGICAL LIGHT HANDLE (MISCELLANEOUS) ×3 IMPLANT
CUFF TOURNIQUET SINGLE 34IN LL (TOURNIQUET CUFF) IMPLANT
CUFF TOURNIQUET SINGLE 44IN (TOURNIQUET CUFF) IMPLANT
DRAIN PENROSE 1/2X12 LTX STRL (WOUND CARE) IMPLANT
DRAPE EXTREMITY T 121X128X90 (DRAPE) ×3 IMPLANT
DRAPE PROXIMA HALF (DRAPES) ×6 IMPLANT
DRAPE U-SHAPE 47X51 STRL (DRAPES) ×6 IMPLANT
DRSG ADAPTIC 3X8 NADH LF (GAUZE/BANDAGES/DRESSINGS) ×3 IMPLANT
DRSG PAD ABDOMINAL 8X10 ST (GAUZE/BANDAGES/DRESSINGS) ×3 IMPLANT
DURAPREP 26ML APPLICATOR (WOUND CARE) ×3 IMPLANT
ELECT REM PT RETURN 9FT ADLT (ELECTROSURGICAL) ×3
ELECTRODE REM PT RTRN 9FT ADLT (ELECTROSURGICAL) ×1 IMPLANT
GLOVE BIOGEL PI IND STRL 9 (GLOVE) ×1 IMPLANT
GLOVE BIOGEL PI INDICATOR 9 (GLOVE) ×2
GLOVE SURG ORTHO 9.0 STRL STRW (GLOVE) ×3 IMPLANT
GOWN PREVENTION PLUS XLARGE (GOWN DISPOSABLE) ×3 IMPLANT
GOWN SRG XL XLNG 56XLVL 4 (GOWN DISPOSABLE) ×1 IMPLANT
GOWN STRL NON-REIN XL XLG LVL4 (GOWN DISPOSABLE) ×2
KIT BASIN OR (CUSTOM PROCEDURE TRAY) ×3 IMPLANT
KIT ROOM TURNOVER OR (KITS) ×3 IMPLANT
MANIFOLD NEPTUNE II (INSTRUMENTS) ×3 IMPLANT
NS IRRIG 1000ML POUR BTL (IV SOLUTION) ×3 IMPLANT
PACK GENERAL/GYN (CUSTOM PROCEDURE TRAY) ×3 IMPLANT
PAD ABD 8X10 STRL (GAUZE/BANDAGES/DRESSINGS) ×6 IMPLANT
PAD ARMBOARD 7.5X6 YLW CONV (MISCELLANEOUS) ×6 IMPLANT
SPONGE GAUZE 4X4 12PLY (GAUZE/BANDAGES/DRESSINGS) ×3 IMPLANT
SPONGE LAP 18X18 X RAY DECT (DISPOSABLE) IMPLANT
STAPLER VISISTAT 35W (STAPLE) IMPLANT
STOCKINETTE IMPERVIOUS LG (DRAPES) ×3 IMPLANT
SUT PDS AB 1 CT  36 (SUTURE)
SUT PDS AB 1 CT 36 (SUTURE) IMPLANT
SUT SILK 2 0 (SUTURE) ×2
SUT SILK 2-0 18XBRD TIE 12 (SUTURE) ×1 IMPLANT
TOWEL OR 17X24 6PK STRL BLUE (TOWEL DISPOSABLE) ×3 IMPLANT
TOWEL OR 17X26 10 PK STRL BLUE (TOWEL DISPOSABLE) ×3 IMPLANT
TUBE ANAEROBIC SPECIMEN COL (MISCELLANEOUS) IMPLANT
WATER STERILE IRR 1000ML POUR (IV SOLUTION) ×3 IMPLANT

## 2014-01-20 NOTE — Progress Notes (Signed)
Blood bank called to say blood may take an hour longer. Notified Dr.Jackson and Dr. Sharol Given. Dr. Sharol Given is going to put in orders to transfuse 1 unit on floor if not ready in pacu. Will cont to monitor

## 2014-01-20 NOTE — Op Note (Signed)
OPERATIVE REPORT  DATE OF SURGERY: 01/20/2014  PATIENT:  [name],  79 y.o. male  PRE-OPERATIVE DIAGNOSIS:  Right Calcaneal Osteomyelitis   POST-OPERATIVE DIAGNOSIS:  Right Calcaneal Osteomyelitis   PROCEDURE:  Procedure(s): AMPUTATION BELOW KNEE- right  SURGEON:  Surgeon(s): [name], MD  ANESTHESIA:   general  EBL:  min ML  SPECIMEN:  Source of Specimen:  Right leg  TOURNIQUET:   Total Tourniquet Time Documented: Thigh (Right) - 6 minutes Total: Thigh (Right) - 6 minutes   PROCEDURE DETAILS: Patient is a 79 year old gentleman with peripheral vascular disease with chronic osteomyelitis of the right calcaneus with ulceration. Patient states that he does not want to continue with foot salvage intervention and wished to proceed with a transtibial amputation. Risks and benefits were discussed including infection neurovascular injury pain DVT nonhealing of the wound. Patient states he understands and wished to proceed at this time. Description of procedure patient was brought to the operating room and underwent a general anesthetic. After adequate levels of anesthesia were obtained patient's right lower extremity was prepped using DuraPrep draped into a sterile field and the foot was draped out of the sterile field with an impervious stockinette. A transverse incision was made 11 cm distal to the tibial tubercle this curved proximally and a large posterior flap was created. The tibia was transected just proximal to the skin incision and beveled anteriorly. The fibula was transected just proximal to the tibial incision. A knife was used to create a large posterior flap. The sciatic nerve was pulled cut and allowed to retract. The vascular bundles were suture ligated with 2-0 silk. The tourniquet was deflated hemostasis was obtained. The deep and superficial fascial layers were closed using #1 PDS. The skin was closed using staples and sutures. The wound was covered with Adaptic  orthopedic sponges AB dressing Kerlix and Coban. Patient was extubated taken to the PACU in stable condition.  PLAN OF CARE: Admit to inpatient   PATIENT DISPOSITION:  PACU - hemodynamically stable.   [name], MD 01/20/2014 8:30 AM

## 2014-01-20 NOTE — Progress Notes (Signed)
Pt. Back form surgery, Dressing to right BKA CDI,  Pt. C/o of surgical pain 8 out of 10, dull constant.  VSS - Blood pressure 168/82, pulse 79, temperature 98.1 F (36.7 C), temperature source Oral, resp. rate 16, height 5\' 10"  (1.778 m), weight 81.3 kg (179 lb 3.7 oz), SpO2 94.00%., R/A.  Will continue to monitor.  Kandiss Ihrig Wynetta Emery,

## 2014-01-20 NOTE — Progress Notes (Signed)
[facility] for Infectious Disease  Date of Admission:  01/16/2014  Antibiotics: total days 39 Zosyn day 5  Subjective: S/p BKA  Objective: Temp:  [97.2 F (36.2 C)-98.5 F (36.9 C)] 97.9 F (36.6 C) (03/02 1303) Pulse Rate:  [72-106] 85 (03/02 1303) Resp:  [8-27] 18 (03/02 1303) BP: (146-172)/(61-100) 147/100 mmHg (03/02 1303) SpO2:  [94 %-100 %] 94 % (03/02 1303) Weight:  [179 lb 3.7 oz (81.3 kg)] 179 lb 3.7 oz (81.3 kg) (03/02 0452)  General: awake, nad Skin: no rashes Lungs: CTA Cor: irr irr Abdomen: soft, nt, nd Right ext wrapped  Lab Results Lab Results  Component Value Date   WBC 5.6 01/20/2014   HGB 8.4* 01/20/2014   HCT 24.8* 01/20/2014   MCV 82.4 01/20/2014   PLT 214 01/20/2014    Lab Results  Component Value Date   CREATININE 1.14 01/20/2014   BUN 22 01/20/2014   NA 136* 01/20/2014   K 4.2 01/20/2014   CL 99 01/20/2014   CO2 26 01/20/2014    Lab Results  Component Value Date   ALT 17 01/20/2014   AST 25 01/20/2014   ALKPHOS 137* 01/20/2014   BILITOT 0.4 01/20/2014      Microbiology: Recent Results (from the past 240 hour(s))  CULTURE, BLOOD (ROUTINE X 2)     Status: None   Collection Time    01/16/14  4:44 PM      Result Value Ref Range Status   Specimen Description BLOOD ARM LEFT   Final   Special Requests BOTTLES DRAWN AEROBIC AND ANAEROBIC 5CC   Final   Culture  Setup Time     Final   Value: 01/16/2014 22:48     Performed at [facility]   Culture     Final   Value:        BLOOD CULTURE RECEIVED NO GROWTH TO DATE CULTURE WILL BE HELD FOR 5 DAYS BEFORE ISSUING A FINAL NEGATIVE REPORT     Performed at [facility]   Report Status PENDING   Incomplete  CULTURE, BLOOD (ROUTINE X 2)     Status: None   Collection Time    01/16/14  4:45 PM      Result Value Ref Range Status   Specimen Description BLOOD ARM RIGHT   Final   Special Requests BOTTLES DRAWN AEROBIC AND ANAEROBIC 10CC   Final   Culture  Setup Time     Final   Value: 01/16/2014  22:48     Performed at [facility]   Culture     Final   Value:        BLOOD CULTURE RECEIVED NO GROWTH TO DATE CULTURE WILL BE HELD FOR 5 DAYS BEFORE ISSUING A FINAL NEGATIVE REPORT     Performed at [facility]   Report Status PENDING   Incomplete  URINE CULTURE     Status: None   Collection Time    01/16/14  8:42 PM      Result Value Ref Range Status   Specimen Description URINE, RANDOM   Final   Special Requests NONE   Final   Culture  Setup Time     Final   Value: 01/17/2014 01:19     Performed at [facility]     Final   Value: 65,000 COLONIES/ML     Performed at [facility]   Culture     Final   Value: Multiple  bacterial morphotypes present, none predominant. Suggest appropriate recollection if clinically indicated.     Performed at [facility]   Report Status 01/17/2014 FINAL   Final  MRSA PCR SCREENING     Status: None   Collection Time    01/16/14 10:26 PM      Result Value Ref Range Status   MRSA by PCR NEGATIVE  NEGATIVE Final   Comment:            The GeneXpert MRSA Assay (FDA     approved for NASAL specimens     only), is one component of a     comprehensive MRSA colonization     surveillance program. It is not     intended to diagnose MRSA     infection nor to guide or     monitor treatment for     MRSA infections.  SURGICAL PCR SCREEN     Status: None   Collection Time    01/17/14  9:40 AM      Result Value Ref Range Status   MRSA, PCR NEGATIVE  NEGATIVE Final   Staphylococcus aureus NEGATIVE  NEGATIVE Final   Comment:            The Xpert SA Assay (FDA     approved for NASAL specimens     in patients over 66 years of age),     is one component of     a comprehensive surveillance     program.  Test performance has     been validated by [employer] for patients greater     than or equal to 70 year old.     It is not intended     to diagnose infection nor to     guide or monitor  treatment.  ANAEROBIC CULTURE     Status: None   Collection Time    01/17/14 12:09 PM      Result Value Ref Range Status   Specimen Description TISSUE BONE RIGHT   Final   Special Requests RIGHT CALCANEOUS BONE PT ON ZOSYN   Final   Gram Stain PENDING   Incomplete   Culture     Final   Value: NO ANAEROBES ISOLATED; CULTURE IN PROGRESS FOR 5 DAYS     Performed at [facility]   Report Status PENDING   Incomplete  TISSUE CULTURE     Status: None   Collection Time    01/17/14 12:09 PM      Result Value Ref Range Status   Specimen Description TISSUE BONE RIGHT   Final   Special Requests RIGHT CALCANEOUS BONE PT ON ZOSYN   Final   Gram Stain     Final   Value: RARE WBC PRESENT,BOTH PMN AND MONONUCLEAR     FEW GRAM POSITIVE COCCI IN PAIRS     Performed at [facility]   Culture     Final   Value: MODERATE GROUP B STREP(S.AGALACTIAE)ISOLATED     Note: TESTING AGAINST S. AGALACTIAE NOT ROUTINELY PERFORMED DUE TO PREDICTABILITY OF AMP/PEN/VAN SUSCEPTIBILITY.     Performed at [facility]   Report Status 01/19/2014 FINAL   Final    Studies/Results: Dg Wrist Complete Right  01/18/2014   CLINICAL DATA:  [place] several days ago. Right wrist pain and swelling.  EXAM: RIGHT WRIST - COMPLETE 3+ VIEW  COMPARISON:  None.  FINDINGS: No acute fracture. There is an old ulnar styloid fracture. Soft tissue  calcifications are seen surrounding the wrist joints. This suggest chondrocalcinosis. There is joint space narrowing and subchondral sclerosis with adjacent soft tissue calcification at the first carpal metacarpal articulation. The bones are diffusely demineralized.  There is mild soft tissue swelling predominates along the ulnar aspect of the wrist.  IMPRESSION: No acute fracture or dislocation.   Electronically Signed   By: Lajean Manes M.D.   On: 01/18/2014 19:59    Assessment/Plan: 1) osteomyelitis s/p amputation - continuing with cefazolin for 3 doses.  I will d/c zosyn and  no indication for further antibiotics.    Thanks for consult  Scharlene Gloss, [facility] for Infectious Disease [place] [email or web address] [phone] pager   [phone] cell 01/20/2014, 4:03 PM

## 2014-01-20 NOTE — Progress Notes (Signed)
Md came to see pt and was told had a spot therefore pt being prep for OR  CBG now 159  Vs stable . CHG bath done pt awaiting for pick up

## 2014-01-20 NOTE — Progress Notes (Signed)
PRBC not yet ready per blood bank. Transferred patient to 5W.

## 2014-01-20 NOTE — Progress Notes (Signed)
Pt picked up at [facility] left a message for pt spouse and daughter on their home.pt remained. NPO.

## 2014-01-20 NOTE — Anesthesia Procedure Notes (Signed)
Procedure Name: LMA Insertion Date/Time: 01/20/2014 7:51 AM Performed by: Manuela Schwartz B Pre-anesthesia Checklist: Patient identified, Emergency Drugs available, Suction available, Patient being monitored and Timeout performed Patient Re-evaluated:Patient Re-evaluated prior to inductionOxygen Delivery Method: Circle system utilized Intubation Type: IV induction LMA: LMA inserted LMA Size: 4.0 Number of attempts: 1 Placement Confirmation: positive ETCO2 and breath sounds checked- equal and bilateral Dental Injury: Teeth and Oropharynx as per pre-operative assessment

## 2014-01-20 NOTE — Progress Notes (Signed)
PT Cancellation Note  Patient Details Name: [name] MRN: [id number] DOB: Jun 29, 1935   Cancelled Treatment:    Reason Eval/Treat Not Completed: Patient at procedure or test/unavailable (Pt undergoing BKA.)   [name] 01/20/2014, 10:01 AM  [place]

## 2014-01-20 NOTE — Progress Notes (Signed)
PATIENT DETAILS Name: [name] Age: 79 y.o. Sex: male Date of Birth: January 05, 1935 Admit Date: 01/16/2014 Admitting Physician Theodis Blaze, MD XBM:[id number] EDWARD, MD  Subjective: No major events overnight.  Assessment/Plan: Osteomyelitis of the right calcaneus - Patient was admitted, initially refused to undergo a BKA, plastics took the patient to the OR on 2/27 for wound debridement and bone biopsy. Postoperatively, patient changed his mind and now was agreeable to undergo a right BKA. Dr. Sharol Given subsequently took the patient for a BKA on 3/2 -will need to d/w ID to see if antibiotics are still needed post BKA  Atrial fibrillation - Continue with Eliquis -however briefly held for OR on 3/1-spoke with Dr Sharol Given ok to resume  Diabetes - CBGs stable - Continue with SSI and Lantus - Hold metformin while inpatient  Chronic diastolic failure - Clinically compensated - Continue Lasix at half of home dose  Anemia -suspect 2/2 Chronic disease -Ortho wanting to transfuse 1 unit of PRBC  Disposition: Remain inpatient-SNF on discharge  DVT Prophylaxis: Not needed as an Eliquis  Code Status: Full code   Family Communication None at bedside  Procedures:  None  CONSULTS:  ID, orthopedic surgery and Plastics   MEDICATIONS: Scheduled Meds: .  ceFAZolin (ANCEF) IV  1 g Intravenous Q6H  . digoxin  0.125 mg Oral Daily  . diltiazem  180 mg Oral Daily  . furosemide  40 mg Oral Daily  . HYDROmorphone      . HYDROmorphone      . HYDROmorphone      . insulin aspart  0-5 Units Subcutaneous QHS  . insulin aspart  0-9 Units Subcutaneous TID WC  . insulin glargine  10 Units Subcutaneous QHS  . pantoprazole  40 mg Oral Daily  . piperacillin-tazobactam (ZOSYN)  IV  3.375 g Intravenous Q8H   Continuous Infusions: . sodium chloride 1,000 mL (01/20/14 1123)   PRN Meds:.HYDROmorphone (DILAUDID) injection, HYDROmorphone (DILAUDID) injection, ibuprofen, methocarbamol  (ROBAXIN) IV, methocarbamol, metoCLOPramide (REGLAN) injection, metoCLOPramide, ondansetron (ZOFRAN) IV, ondansetron, oxyCODONE, oxyCODONE-acetaminophen, zolpidem  Antibiotics: Anti-infectives   Start     Dose/Rate Route Frequency Ordered Stop   01/20/14 1400  ceFAZolin (ANCEF) IVPB 1 g/50 mL premix     1 g 100 mL/hr over 30 Minutes Intravenous Every 6 hours 01/20/14 1038 01/21/14 0759   01/17/14 1600  piperacillin-tazobactam (ZOSYN) IVPB 3.375 g     3.375 g 12.5 mL/hr over 240 Minutes Intravenous Every 8 hours 01/17/14 1335     01/17/14 1500  vancomycin (VANCOCIN) IVPB 750 mg/150 ml premix  Status:  Discontinued     750 mg 150 mL/hr over 60 Minutes Intravenous Every 12 hours 01/17/14 1335 01/19/14 1102   01/17/14 1218  polymyxin B 500,000 Units, bacitracin 50,000 Units in sodium chloride irrigation 0.9 % 500 mL irrigation  Status:  Discontinued       As needed 01/17/14 1218 01/17/14 1237   01/17/14 1100  piperacillin-tazobactam (ZOSYN) IVPB 3.375 g  Status:  Discontinued     3.375 g 100 mL/hr over 30 Minutes Intravenous  Once 01/17/14 1055 01/17/14 1405   01/17/14 0800  vancomycin (VANCOCIN) IVPB 750 mg/150 ml premix  Status:  Discontinued     750 mg 150 mL/hr over 60 Minutes Intravenous Every 12 hours 01/16/14 1657 01/17/14 0820   01/17/14 0000  piperacillin-tazobactam (ZOSYN) IVPB 3.375 g  Status:  Discontinued     3.375 g 12.5 mL/hr over 240 Minutes Intravenous Every 8  hours 01/16/14 1657 01/17/14 0820   01/16/14 1645  piperacillin-tazobactam (ZOSYN) IVPB 3.375 g     3.375 g 100 mL/hr over 30 Minutes Intravenous  Once 01/16/14 1642 01/16/14 1926   01/16/14 1645  vancomycin (VANCOCIN) IVPB 1000 mg/200 mL premix     1,000 mg 200 mL/hr over 60 Minutes Intravenous  Once 01/16/14 1642 01/16/14 1926       PHYSICAL EXAM: Vital signs in last 24 hours: Filed Vitals:   01/20/14 1011 01/20/14 1035 01/20/14 1123 01/20/14 1303  BP:  168/82 172/83 147/100  Pulse:  79 106 85  Temp: 97.8  F (36.6 C) 98.1 F (36.7 C) 97.7 F (36.5 C) 97.9 F (36.6 C)  TempSrc:    Oral  Resp:  16 18 18   Height:      Weight:      SpO2:  94% 94% 94%    Weight change: 0 kg (0 lb) Filed Weights   01/17/14 0500 01/19/14 0500 01/20/14 0452  Weight: 78.5 kg (173 lb 1 oz) 81.3 kg (179 lb 3.7 oz) 81.3 kg (179 lb 3.7 oz)   Body mass index is 25.72 kg/(m^2).   Gen Exam: Awake and alert with clear speech.   Neck: Supple, No JVD.   Chest: B/L Clear.  No rales or rhonchi CVS: S1 S2 Regular Abdomen: soft, BS +, non tender, non distended.  Extremities: no edema, lower extremities warm to touch. S/P Right BKA-right lef in a bandage -did not open. Neurologic: Non Focal.   Skin: No Rash.   Wounds: [name].   Intake/Output from previous day:  Intake/Output Summary (Last 24 hours) at 01/20/14 1411 Last data filed at 01/20/14 1317  Gross per 24 hour  Intake    700 ml  Output   1872 ml  Net  -1172 ml     LAB RESULTS: CBC  Recent Labs Lab 01/16/14 1623 01/16/14 1818 01/17/14 0253 01/18/14 0548 01/20/14 0537  WBC 11.1* 9.0 7.9 8.0 5.6  HGB 10.2* 9.2* 8.6* 8.4* 8.4*  HCT 30.0* 27.4* 25.5* 25.8* 24.8*  PLT 296 239 218 244 214  MCV 81.3 81.3 81.2 81.9 82.4  MCH 27.6 27.3 27.4 26.7 27.9  MCHC 34.0 33.6 33.7 32.6 33.9  RDW 13.7 13.8 13.9 14.1 14.1  LYMPHSABS  --  1.2  --   --  1.3  MONOABS  --  0.6  --   --  0.4  EOSABS  --  0.1  --   --  0.2  BASOSABS  --  0.0  --   --  0.0    Chemistries   Recent Labs Lab 01/16/14 1623 01/17/14 0253 01/18/14 0548 01/20/14 0537  NA 131* 132* 132* 136*  K 4.7 4.2 4.2 4.2  CL 93* 97 96 99  CO2 26 23 25 26   GLUCOSE 191* 142* 192* 162*  BUN 17 16 17 22   CREATININE 1.19 1.12 1.27 1.14  CALCIUM 9.4 8.6 8.3* 8.5    CBG:  Recent Labs Lab 01/19/14 2119 01/19/14 2217 01/20/14 0645 01/20/14 0839 01/20/14 1151  GLUCAP 217* 215* 159* 156* 153*    GFR Estimated Creatinine Clearance: 56.9 ml/min (by C-G formula based on Cr of  1.14).  Coagulation profile No results found for this basename: INR, PROTIME,  in the last 168 hours  Cardiac Enzymes No results found for this basename: CK, CKMB, TROPONINI, MYOGLOBIN,  in the last 168 hours  No components found with this basename: POCBNP,  No results found for this basename: DDIMER,  in the last 72 hours No results found for this basename: HGBA1C,  in the last 72 hours No results found for this basename: CHOL, HDL, LDLCALC, TRIG, CHOLHDL, LDLDIRECT,  in the last 72 hours No results found for this basename: TSH, T4TOTAL, FREET3, T3FREE, THYROIDAB,  in the last 72 hours No results found for this basename: VITAMINB12, FOLATE, FERRITIN, TIBC, IRON, RETICCTPCT,  in the last 72 hours No results found for this basename: LIPASE, AMYLASE,  in the last 72 hours  Urine Studies No results found for this basename: UACOL, UAPR, USPG, UPH, UTP, UGL, UKET, UBIL, UHGB, UNIT, UROB, ULEU, UEPI, UWBC, URBC, UBAC, CAST, CRYS, UCOM, BILUA,  in the last 72 hours  MICROBIOLOGY: Recent Results (from the past 240 hour(s))  CULTURE, BLOOD (ROUTINE X 2)     Status: None   Collection Time    01/16/14  4:44 PM      Result Value Ref Range Status   Specimen Description BLOOD ARM LEFT   Final   Special Requests BOTTLES DRAWN AEROBIC AND ANAEROBIC 5CC   Final   Culture  Setup Time     Final   Value: 01/16/2014 22:48     Performed at [facility]   Culture     Final   Value:        BLOOD CULTURE RECEIVED NO GROWTH TO DATE CULTURE WILL BE HELD FOR 5 DAYS BEFORE ISSUING A FINAL NEGATIVE REPORT     Performed at [facility]   Report Status PENDING   Incomplete  CULTURE, BLOOD (ROUTINE X 2)     Status: None   Collection Time    01/16/14  4:45 PM      Result Value Ref Range Status   Specimen Description BLOOD ARM RIGHT   Final   Special Requests BOTTLES DRAWN AEROBIC AND ANAEROBIC 10CC   Final   Culture  Setup Time     Final   Value: 01/16/2014 22:48     Performed at [facility]   Culture     Final   Value:        BLOOD CULTURE RECEIVED NO GROWTH TO DATE CULTURE WILL BE HELD FOR 5 DAYS BEFORE ISSUING A FINAL NEGATIVE REPORT     Performed at [facility]   Report Status PENDING   Incomplete  URINE CULTURE     Status: None   Collection Time    01/16/14  8:42 PM      Result Value Ref Range Status   Specimen Description URINE, RANDOM   Final   Special Requests NONE   Final   Culture  Setup Time     Final   Value: 01/17/2014 01:19     Performed at [facility]     Final   Value: 65,000 COLONIES/ML     Performed at [facility]   Culture     Final   Value: Multiple bacterial morphotypes present, none predominant. Suggest appropriate recollection if clinically indicated.     Performed at [facility]   Report Status 01/17/2014 FINAL   Final  MRSA PCR SCREENING     Status: None   Collection Time    01/16/14 10:26 PM      Result Value Ref Range Status   MRSA by PCR NEGATIVE  NEGATIVE Final   Comment:            The GeneXpert MRSA Assay (FDA  approved for NASAL specimens     only), is one component of a     comprehensive MRSA colonization     surveillance program. It is not     intended to diagnose MRSA     infection nor to guide or     monitor treatment for     MRSA infections.  SURGICAL PCR SCREEN     Status: None   Collection Time    01/17/14  9:40 AM      Result Value Ref Range Status   MRSA, PCR NEGATIVE  NEGATIVE Final   Staphylococcus aureus NEGATIVE  NEGATIVE Final   Comment:            The Xpert SA Assay (FDA     approved for NASAL specimens     in patients over 36 years of age),     is one component of     a comprehensive surveillance     program.  Test performance has     been validated by [employer] for patients greater     than or equal to 60 year old.     It is not intended     to diagnose infection nor to     guide or monitor treatment.  ANAEROBIC CULTURE     Status:  None   Collection Time    01/17/14 12:09 PM      Result Value Ref Range Status   Specimen Description TISSUE BONE RIGHT   Final   Special Requests RIGHT CALCANEOUS BONE PT ON ZOSYN   Final   Gram Stain PENDING   Incomplete   Culture     Final   Value: NO ANAEROBES ISOLATED; CULTURE IN PROGRESS FOR 5 DAYS     Performed at [facility]   Report Status PENDING   Incomplete  TISSUE CULTURE     Status: None   Collection Time    01/17/14 12:09 PM      Result Value Ref Range Status   Specimen Description TISSUE BONE RIGHT   Final   Special Requests RIGHT CALCANEOUS BONE PT ON ZOSYN   Final   Gram Stain     Final   Value: RARE WBC PRESENT,BOTH PMN AND MONONUCLEAR     FEW GRAM POSITIVE COCCI IN PAIRS     Performed at [facility]   Culture     Final   Value: MODERATE GROUP B STREP(S.AGALACTIAE)ISOLATED     Note: TESTING AGAINST S. AGALACTIAE NOT ROUTINELY PERFORMED DUE TO PREDICTABILITY OF AMP/PEN/VAN SUSCEPTIBILITY.     Performed at [facility]   Report Status 01/19/2014 FINAL   Final    RADIOLOGY STUDIES/RESULTS: Dg Os Calcis Right  01/16/2014   CLINICAL DATA:  Large open wound over the calcaneus.  EXAM: RIGHT OS CALCIS - 2+ VIEW  COMPARISON:  None.  FINDINGS: There is a large soft tissue wound along the lateral margin calcaneus. Underlying bony destructive change in the posterior and lateral calcaneus is consistent with osteomyelitis. No radiopaque foreign body is identified. Vascular calcifications are noted.  IMPRESSION: Large skin ulceration over the lateral calcaneus with underlying osteomyelitis.   Electronically Signed   By: Inge Rise M.D.   On: 01/16/2014 17:42   Ct Foot Right W Contrast  12/25/2013   CLINICAL DATA:  Osteomyelitis.  EXAM: CT OF THE RIGHT FOOT WITH CONTRAST  TECHNIQUE: Multidetector CT imaging was performed following the standard protocol during bolus administration of intravenous contrast.  CONTRAST:  134mL OMNIPAQUE IOHEXOL 300 MG/ML   SOLN  COMPARISON:  DG OS CALCIS*R* dated 12/12/2013  FINDINGS: There is an ulcer along the lateral aspect of the calcaneus, extending to the cortical surface. There is rare fraction of bone and cortical osteolysis compatible with active osteomyelitis in a lateral calcaneus deep to the ulcer. Gas extends through the ulcer tract to the bone surface. There midfoot erosions in the medial cuneiform bone which may be associated with the patient's history of gout arthritis or ordinary degenerative disease. First MTP joint osteoarthritis is mild. Old avulsion fragments are present adjacent to the medial malleolus in the deltoid ligament. Calcification of the posterior talofibular ligament is incidentally noted. No soft tissue abscess is identified. Grossly, the Achilles tendon and plantar fascia appear within normal limits.  IMPRESSION: Lateral hindfoot ulceration with osteomyelitis of the lateral calcaneal tuberosity (image 28 series 3). No soft tissue abscess.   Electronically Signed   By: Dereck Ligas M.D.   On: 12/25/2013 15:46    Oren Binet, MD  [facility] Pager:[phone]  If 7PM-7AM, please contact night-coverage [email or web address] Password [email or web address] 01/20/2014, 2:11 PM   LOS: 4 days

## 2014-01-20 NOTE — Progress Notes (Signed)
Patient ID: [name], male   DOB: Apr 10, 1935, 79 y.o.   MRN: [id number] Patient underwent bone biopsy and application of xenograft tissue graft and wound VAC on Friday. Discussed with the patient options at this point which include continue observation versus a transtibial amputation. Patient states that he wants to proceed with a transtibial amputation as soon as possible. He states that he does not want to continue prolonged conservative therapy for foot salvage. Patient states that he would like to be discharged to skilled nursing postoperatively. Plan for add-on surgery today.

## 2014-01-20 NOTE — H&P (View-Only) (Signed)
Reason for Consult: Osteomyelitis abscess right calcaneus. Referring Physician: Dr. Ghimire  [name] is an 79 y.o. male.  HPI: Patient is a 79-year-old gentleman with multiple medical problems including diabetes peripheral vascular disease cardiac pacemaker with a history of a spinal infection status post spinal instrumentation. Patient presents with a draining ulcer lateral border right heel. Patient has been treated at the wound center with hyperbaric oxygen treatments x10. Patient states that he is scheduled for surgical intervention with Dr. Sanger on Wednesday.  Past Medical History  Diagnosis Date  . Fatigue   . Overweight   . Syncope and collapse   . GERD (gastroesophageal reflux disease)   . Right bundle branch block   . Unspecified venous (peripheral) insufficiency   . Phlebitis and thrombophlebitis of superficial vessels of lower extremities   . Cellulitis and abscess of leg, except foot   . Backache, unspecified   . Long term (current) use of anticoagulants   . Type II or unspecified type diabetes mellitus without mention of complication, not stated as uncontrolled   . Unspecified venous (peripheral) insufficiency   . Hypertension   . Gout, unspecified   . Heart failure   . Atrial fibrillation     permanent  . Esophageal stricture   . Hx of colonic polyps 05-2006    (Adenomatous)Dr. Kaplan  . Diverticulosis 05-2006    Dr. Kaplan   . Tachycardia-bradycardia     s/p PPM  . Pacemaker- [name] 02/20/2013    Past Surgical History  Procedure Laterality Date  . Laminectomy    . Lumbar fusion    . Rotator cuff repair    . Ruptured rt rectus muscle    . Eye surgery    . Pacemaker insertion  8/12    SJM by Dr Allred for tachy/brady syndrome    Family History  Problem Relation Age of Onset  . Pneumonia Mother   . Heart disease Father   . Diabetes Father   . Colon cancer Neg Hx     Social History:  reports that he has never smoked. He has never used  smokeless tobacco. He reports that he drinks alcohol. He reports that he does not use illicit drugs.  Allergies:  Allergies  Allergen Reactions  . Morphine Rash and Other (See Comments)    Irritability also    Medications: I have reviewed the patient's current medications.  Results for orders placed during the hospital encounter of 01/16/14 (from the past 48 hour(s))  CBC     Status: Abnormal   Collection Time    01/16/14  4:23 PM      Result Value Ref Range   WBC 11.1 (*) 4.0 - 10.5 K/uL   RBC 3.69 (*) 4.22 - 5.81 MIL/uL   Hemoglobin 10.2 (*) 13.0 - 17.0 g/dL   HCT 30.0 (*) 39.0 - 52.0 %   MCV 81.3  78.0 - 100.0 fL   MCH 27.6  26.0 - 34.0 pg   MCHC 34.0  30.0 - 36.0 g/dL   RDW 13.7  11.5 - 15.5 %   Platelets 296  150 - 400 K/uL  BASIC METABOLIC PANEL     Status: Abnormal   Collection Time    01/16/14  4:23 PM      Result Value Ref Range   Sodium 131 (*) 137 - 147 mEq/L   Potassium 4.7  3.7 - 5.3 mEq/L   Chloride 93 (*) 96 - 112 mEq/L   CO2 26  19 -   32 mEq/L   Glucose, Bld 191 (*) 70 - 99 mg/dL   BUN 17  6 - 23 mg/dL   Creatinine, Ser 1.19  0.50 - 1.35 mg/dL   Calcium 9.4  8.4 - 10.5 mg/dL   GFR calc non Af Amer 58 (*) >90 mL/min   GFR calc Af Amer 67 (*) >90 mL/min   Comment: (NOTE)     The eGFR has been calculated using the CKD EPI equation.     This calculation has not been validated in all clinical situations.     eGFR's persistently <90 mL/min signify possible Chronic Kidney     Disease.  I-STAT CG4 LACTIC ACID, ED     Status: None   Collection Time    01/16/14  5:07 PM      Result Value Ref Range   Lactic Acid, Venous 1.62  0.5 - 2.2 mmol/L  CBC WITH DIFFERENTIAL     Status: Abnormal   Collection Time    01/16/14  6:18 PM      Result Value Ref Range   WBC 9.0  4.0 - 10.5 K/uL   RBC 3.37 (*) 4.22 - 5.81 MIL/uL   Hemoglobin 9.2 (*) 13.0 - 17.0 g/dL   HCT 27.4 (*) 39.0 - 52.0 %   MCV 81.3  78.0 - 100.0 fL   MCH 27.3  26.0 - 34.0 pg   MCHC 33.6  30.0 -  36.0 g/dL   RDW 13.8  11.5 - 15.5 %   Platelets 239  150 - 400 K/uL   Neutrophils Relative % 79 (*) 43 - 77 %   Neutro Abs 7.1  1.7 - 7.7 K/uL   Lymphocytes Relative 13  12 - 46 %   Lymphs Abs 1.2  0.7 - 4.0 K/uL   Monocytes Relative 7  3 - 12 %   Monocytes Absolute 0.6  0.1 - 1.0 K/uL   Eosinophils Relative 1  0 - 5 %   Eosinophils Absolute 0.1  0.0 - 0.7 K/uL   Basophils Relative 0  0 - 1 %   Basophils Absolute 0.0  0.0 - 0.1 K/uL  GLUCOSE, CAPILLARY     Status: Abnormal   Collection Time    01/16/14  8:16 PM      Result Value Ref Range   Glucose-Capillary 158 (*) 70 - 99 mg/dL  URINALYSIS, ROUTINE W REFLEX MICROSCOPIC     Status: Abnormal   Collection Time    01/16/14  8:42 PM      Result Value Ref Range   Color, Urine YELLOW  YELLOW   APPearance CLEAR  CLEAR   Specific Gravity, Urine 1.018  1.005 - 1.030   pH 7.0  5.0 - 8.0   Glucose, UA 100 (*) NEGATIVE mg/dL   Hgb urine dipstick SMALL (*) NEGATIVE   Bilirubin Urine NEGATIVE  NEGATIVE   Ketones, ur NEGATIVE  NEGATIVE mg/dL   Protein, ur >300 (*) NEGATIVE mg/dL   Urobilinogen, UA 0.2  0.0 - 1.0 mg/dL   Nitrite NEGATIVE  NEGATIVE   Leukocytes, UA NEGATIVE  NEGATIVE  URINE MICROSCOPIC-ADD ON     Status: None   Collection Time    01/16/14  8:42 PM      Result Value Ref Range   WBC, UA 0-2  <3 WBC/hpf   RBC / HPF 3-6  <3 RBC/hpf   Bacteria, UA RARE  RARE  PRO B NATRIURETIC PEPTIDE     Status: Abnormal   Collection Time      01/16/14  8:43 PM      Result Value Ref Range   Pro B Natriuretic peptide (BNP) 3148.0 (*) 0 - 450 pg/mL  MRSA PCR SCREENING     Status: None   Collection Time    01/16/14 10:26 PM      Result Value Ref Range   MRSA by PCR NEGATIVE  NEGATIVE   Comment:            The GeneXpert MRSA Assay (FDA     approved for NASAL specimens     only), is one component of a     comprehensive MRSA colonization     surveillance program. It is not     intended to diagnose MRSA     infection nor to guide or      monitor treatment for     MRSA infections.  BASIC METABOLIC PANEL     Status: Abnormal   Collection Time    01/17/14  2:53 AM      Result Value Ref Range   Sodium 132 (*) 137 - 147 mEq/L   Potassium 4.2  3.7 - 5.3 mEq/L   Chloride 97  96 - 112 mEq/L   CO2 23  19 - 32 mEq/L   Glucose, Bld 142 (*) 70 - 99 mg/dL   BUN 16  6 - 23 mg/dL   Creatinine, Ser 1.12  0.50 - 1.35 mg/dL   Calcium 8.6  8.4 - 10.5 mg/dL   GFR calc non Af Amer 62 (*) >90 mL/min   GFR calc Af Amer 72 (*) >90 mL/min   Comment: (NOTE)     The eGFR has been calculated using the CKD EPI equation.     This calculation has not been validated in all clinical situations.     eGFR's persistently <90 mL/min signify possible Chronic Kidney     Disease.  CBC     Status: Abnormal   Collection Time    01/17/14  2:53 AM      Result Value Ref Range   WBC 7.9  4.0 - 10.5 K/uL   RBC 3.14 (*) 4.22 - 5.81 MIL/uL   Hemoglobin 8.6 (*) 13.0 - 17.0 g/dL   HCT 25.5 (*) 39.0 - 52.0 %   MCV 81.2  78.0 - 100.0 fL   MCH 27.4  26.0 - 34.0 pg   MCHC 33.7  30.0 - 36.0 g/dL   RDW 13.9  11.5 - 15.5 %   Platelets 218  150 - 400 K/uL  GLUCOSE, CAPILLARY     Status: Abnormal   Collection Time    01/17/14  8:14 AM      Result Value Ref Range   Glucose-Capillary 119 (*) 70 - 99 mg/dL    Dg Os Calcis Right  01/16/2014   CLINICAL DATA:  Large open wound over the calcaneus.  EXAM: RIGHT OS CALCIS - 2+ VIEW  COMPARISON:  None.  FINDINGS: There is a large soft tissue wound along the lateral margin calcaneus. Underlying bony destructive change in the posterior and lateral calcaneus is consistent with osteomyelitis. No radiopaque foreign body is identified. Vascular calcifications are noted.  IMPRESSION: Large skin ulceration over the lateral calcaneus with underlying osteomyelitis.   Electronically Signed   By: Thomas  Dalessio M.D.   On: 01/16/2014 17:42    Review of Systems  All other systems reviewed and are negative.   Blood pressure 153/75,  pulse 91, temperature 98.6 F (37 C), temperature source Oral, resp. rate 18, height   5' 10" (1.778 m), weight 78.5 kg (173 lb 1 oz), SpO2 98.00%. Physical Exam On examination I cannot palpate a dorsalis pedis pulse his ankle brachial indices obtained in January showed falsely elevated ankle-brachial indices did show triphasic flow but his clinical vascular status does not reflect these numbers. On examination he has a 2 cm in diameter 1 cm deep necrotic ulcer over the lateral aspect of the calcaneus. This probes all the way to bone with exposed calcaneus. Review of the radiographs of the calcaneus shows significant destructive changes consistent with chronic osteomyelitis of the calcaneus with extensive destructive changes. Assessment/Plan: Assessment: Diabetic insensate neuropathy peripheral vascular disease with a large necrotic ulcer over the lateral calcaneus on the right with extensive destructive changes of the calcaneus with osteomyelitis.  Plan: I feel with the patient's pacemaker and multiple medical problems his safest option would be to proceed with a transtibial amputation. I feel that limb salvage is an option but I feel like his chance of limb salvage success is probably less than 50%. For attempted limb salvage patient would require a large calcaneal ostectomy with placement of antibiotic beads wound closure and placement of a wound VAC. In my opinion I do not feel that continued hyperbaric therapy with wound debridement and tissue grafting would be beneficial. I had a long discussion with the patient at bedside. Discussed that if he has any further questions to call me on my phone. I would be available for surgical intervention on Monday. With the patient's chronic infection and his cardiac pacemaker he is at risk of infection of the pacemaker.  Maegan Buller V 01/17/2014, 8:19 AM      

## 2014-01-20 NOTE — Progress Notes (Signed)
OT Cancellation Note  Patient Details Name: [name] MRN: [id number] DOB: [birth year]   Cancelled Treatment:    Reason Eval/Treat Not Completed: Other (comment) Pt is [facility] Medicare and current D/C plan is SNF. No apparent immediate acute care OT needs, therefore will defer OT to SNF. If OT eval is needed please call Acute Rehab Dept. at [phone] or text page OT at [phone].     Almon Register [phone] 01/20/2014, 3:13 PM

## 2014-01-20 NOTE — Anesthesia Preprocedure Evaluation (Addendum)
Anesthesia Evaluation  Patient identified by MRN, date of birth, ID band Patient awake    Reviewed: Allergy & Precautions, H&P , NPO status , Patient's Chart, lab work & pertinent test results  History of Anesthesia Complications Negative for: history of anesthetic complications  Airway Mallampati: II TM Distance: >3 FB Neck ROM: Full    Dental  (+) Chipped, Missing, Dental Advisory Given   Pulmonary former smoker,  breath sounds clear to auscultation        Cardiovascular hypertension, Pt. on medications + Peripheral Vascular Disease and +CHF + dysrhythmias Atrial Fibrillation + pacemaker (for afib and syncope) Rhythm:Irregular Rate:Normal  Ef 55-60%   Neuro/Psych Chronic back pain: narcotics TIA   GI/Hepatic Neg liver ROS, GERD-  Medicated and Controlled,  Endo/Other  diabetes (glu 162), Type 2, Insulin Dependent, Oral Hypoglycemic Agents  Renal/GU negative Renal ROS     Musculoskeletal   Abdominal   Peds  Hematology  (+) Blood dyscrasia (Hb 8.4, eloquis), anemia ,   Anesthesia Other Findings   Reproductive/Obstetrics                          Anesthesia Physical Anesthesia Plan  ASA: III  Anesthesia Plan: General   Post-op Pain Management:    Induction: Intravenous  Airway Management Planned: LMA  Additional Equipment:   Intra-op Plan:   Post-operative Plan: Extubation in OR  Informed Consent:   Dental advisory given  Plan Discussed with: Anesthesiologist, Surgeon and CRNA  Anesthesia Plan Comments: (Plan routine monitors, GA- LMA OK)       Anesthesia Quick Evaluation

## 2014-01-20 NOTE — Transfer of Care (Signed)
Immediate Anesthesia Transfer of Care Note  Patient: [name]  Procedure(s) Performed: Procedure(s) with comments: AMPUTATION BELOW KNEE- right (Right) - Right Below Knee Amputation  Patient Location: PACU  Anesthesia Type:General  Level of Consciousness: awake, alert , oriented and pateint uncooperative  Airway & Oxygen Therapy: Patient Spontanous Breathing and Patient connected to nasal cannula oxygen  Post-op Assessment: Report given to PACU RN and Post -op Vital signs reviewed and stable  Post vital signs: Reviewed and stable  Complications: No apparent anesthesia complications

## 2014-01-20 NOTE — Anesthesia Postprocedure Evaluation (Signed)
  Anesthesia Post-op Note  Patient: [name]  Procedure(s) Performed: Procedure(s) with comments: AMPUTATION BELOW KNEE- right (Right) - Right Below Knee Amputation  Patient Location: PACU  Anesthesia Type:General  Level of Consciousness: awake, alert , oriented and patient cooperative  Airway and Oxygen Therapy: Patient Spontanous Breathing and Patient connected to nasal cannula oxygen  Post-op Pain: mild  Post-op Assessment: Post-op Vital signs reviewed, Patient's Cardiovascular Status Stable, Respiratory Function Stable, Patent Airway, No signs of Nausea or vomiting and Pain level controlled  Post-op Vital Signs: Reviewed and stable  Complications: No apparent anesthesia complications

## 2014-01-20 NOTE — Progress Notes (Signed)
ANTICOAGULATION CONSULT NOTE - Follow Up Consult  Pharmacy Consult for apixaban Indication: atrial fibrillation  Allergies  Allergen Reactions  . Morphine Rash and Other (See Comments)    Irritability also    Patient Measurements: Height: 5\' 10"  (177.8 cm) Weight: 179 lb 3.7 oz (81.3 kg) IBW/kg (Calculated) : 73  Vital Signs: Temp: 97.9 F (36.6 C) (03/02 1303) Temp src: Oral (03/02 1303) BP: 147/100 mmHg (03/02 1303) Pulse Rate: 85 (03/02 1303)  Labs:  Recent Labs  01/18/14 0548 01/20/14 0537  HGB 8.4* 8.4*  HCT 25.8* 24.8*  PLT 244 214  CREATININE 1.27 1.14    Medications:  Prescriptions prior to admission  Medication Sig Dispense Refill  . apixaban (ELIQUIS) 5 MG TABS tablet Take 1 tablet (5 mg total) by mouth 2 (two) times daily.  60 tablet  11  . ciprofloxacin (CIPRO) 750 MG tablet Take 1 tablet (750 mg total) by mouth 2 (two) times daily.  60 tablet  4  . digoxin (LANOXIN) 0.125 MG tablet Take 0.125 mg by mouth daily.      Marland Kitchen diltiazem (CARDIZEM CD) 180 MG 24 hr capsule Take 1 capsule (180 mg total) by mouth daily.  30 capsule  6  . furosemide (LASIX) 80 MG tablet Take 1 tablet (80 mg total) by mouth daily.  30 tablet  3  . insulin aspart (NOVOLOG) 100 UNIT/ML injection Sliding scale before meals-- if cbg is > 300 take 10 units:if 200-300 use 5 units- if < than 200 no insulin  1 vial  5  . insulin glargine (LANTUS) 100 UNIT/ML injection Inject 25 Units into the skin at bedtime.       . metFORMIN (GLUCOPHAGE) 500 MG tablet Take 500 mg by mouth 2 (two) times daily with a meal.      . omeprazole (PRILOSEC) 20 MG capsule Take 20 mg by mouth 2 (two) times daily before a meal.      . Oxycodone HCl 10 MG TABS Take 1 tablet (10 mg total) by mouth every 6 (six) hours as needed.  120 tablet  0  . Zinc 50 MG TABS Take 1 tablet by mouth 2 (two) times daily.       Assessment: Pt on Eliquis prior to admission for AFib. Pt underwent bilateral knee amputation today, Eliquis  was on hold 3/1, and is now being resumed.  Hgb low but stable at 8.4, plts 214.  No bleeding noted.  Goal of Therapy:  Monitor platelets by anticoagulation protocol: Yes   Plan:  Apixaban 5 mg po bid CBC q72h  Hughes Better, PharmD, BCPS Clinical Pharmacist Pager: [phone] 01/20/2014 2:42 PM

## 2014-01-20 NOTE — Interval H&P Note (Signed)
History and Physical Interval Note:  01/20/2014 6:14 AM  [name]  has presented today for surgery, with the diagnosis of Right Calcaneal Osteomyelitis   The various methods of treatment have been discussed with the patient and family. After consideration of risks, benefits and other options for treatment, the patient has consented to  Procedure(s) with comments: AMPUTATION BELOW KNEE (Right) - Right Below Knee Amputation as a surgical intervention .  The patient's history has been reviewed, patient examined, no change in status, stable for surgery.  I have reviewed the patient's chart and labs.  Questions were answered to the patient's satisfaction.     DUDA,MARCUS V

## 2014-01-21 LAB — GLUCOSE, CAPILLARY
Glucose-Capillary: 114 mg/dL — ABNORMAL HIGH (ref 70–99)
Glucose-Capillary: 221 mg/dL — ABNORMAL HIGH (ref 70–99)
Glucose-Capillary: 209 mg/dL — ABNORMAL HIGH (ref 70–99)
Glucose-Capillary: 196 mg/dL — ABNORMAL HIGH (ref 70–99)

## 2014-01-21 LAB — CBC
HCT: 26.5 % — ABNORMAL LOW (ref 39.0–52.0)
Hemoglobin: 8.7 g/dL — ABNORMAL LOW (ref 13.0–17.0)
MCH: 26.5 pg (ref 26.0–34.0)
MCHC: 32.8 g/dL (ref 30.0–36.0)
MCV: 80.8 fL (ref 78.0–100.0)
PLATELETS: 268 10*3/uL (ref 150–400)
RBC: 3.28 MIL/uL — ABNORMAL LOW (ref 4.22–5.81)
RDW: 14.5 % (ref 11.5–15.5)
WBC: 8.6 10*3/uL (ref 4.0–10.5)

## 2014-01-21 MED ORDER — GLUCERNA SHAKE PO LIQD
237.0000 mL | Freq: Three times a day (TID) | ORAL | Status: DC
  Administered 2014-01-21 – 2014-01-24 (×9): 237 mL via ORAL

## 2014-01-21 NOTE — Progress Notes (Signed)
PATIENT DETAILS Name: [name] Age: 79 y.o. Sex: male Date of Birth: [birth year] Admit Date: 01/16/2014 Admitting Physician Theodis Blaze, MD SE:[id number] EDWARD, MD  Subjective: No major events overnight.  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 Postoperatively, patient changed his mind and now was agreeable to undergo a right BKA. Dr. Sharol Given subsequently took the patient for a BKA on 3/2. ID has now recommended we discontinue all antibiotics.   Atrial fibrillation - Continue with Eliquis -however briefly held for OR on 3/1-spoke with Dr Sharol Given ok to resume  Diabetes - CBGs stable - Continue with SSI and Lantus - Hold metformin while inpatient  Chronic diastolic failure - Clinically compensated - Continue Lasix at half of home dose  Anemia -suspect 2/2 Chronic disease -Ortho wanting to transfuse 1 unit of PRBC  Disposition: Remain inpatient-SNF on discharge-likely on 3/4  DVT Prophylaxis: Not needed as an Eliquis  Code Status: Full code   Family Communication None at bedside  Procedures:  None  CONSULTS:  ID, orthopedic surgery and Plastics   MEDICATIONS: Scheduled Meds: . apixaban  5 mg Oral BID  . digoxin  0.125 mg Oral Daily  . diltiazem  180 mg Oral Daily  . feeding supplement (GLUCERNA SHAKE)  237 mL Oral TID BM  . furosemide  40 mg Oral Daily  . insulin aspart  0-5 Units Subcutaneous QHS  . insulin aspart  0-9 Units Subcutaneous TID WC  . insulin glargine  10 Units Subcutaneous QHS  . pantoprazole  40 mg Oral Daily   Continuous Infusions: . sodium chloride  1,000 mL (01/20/14 1123)   PRN Meds:.HYDROmorphone (DILAUDID) injection, HYDROmorphone (DILAUDID) injection, ibuprofen, methocarbamol (ROBAXIN) IV, methocarbamol, metoCLOPramide (REGLAN) injection, metoCLOPramide, ondansetron (ZOFRAN) IV, ondansetron, oxyCODONE, oxyCODONE-acetaminophen, zolpidem  Antibiotics: Anti-infectives   Start     Dose/Rate Route Frequency Ordered Stop   01/20/14 1400  ceFAZolin (ANCEF) IVPB 1 g/50 mL premix     1 g 100 mL/hr over 30 Minutes Intravenous Every 6 hours 01/20/14 1038 01/21/14 0146   01/17/14 1600  piperacillin-tazobactam (ZOSYN) IVPB 3.375 g  Status:  Discontinued     3.375 g 12.5 mL/hr over 240 Minutes Intravenous Every 8 hours 01/17/14 1335 01/20/14 1703   01/17/14 1500  vancomycin (VANCOCIN) IVPB 750 mg/150 ml premix  Status:  Discontinued     750 mg 150 mL/hr over 60 Minutes Intravenous Every 12 hours 01/17/14 1335 01/19/14 1102   01/17/14 1218  polymyxin B 500,000 Units, bacitracin 50,000 Units in sodium chloride irrigation 0.9 % 500 mL irrigation  Status:  Discontinued       As needed 01/17/14 1218 01/17/14 1237   01/17/14 1100  piperacillin-tazobactam (ZOSYN) IVPB 3.375 g  Status:  Discontinued     3.375 g 100 mL/hr over 30 Minutes Intravenous  Once 01/17/14 1055 01/17/14 1405   01/17/14 0800  vancomycin (VANCOCIN) IVPB 750 mg/150 ml premix  Status:  Discontinued     750 mg 150 mL/hr over 60 Minutes Intravenous Every 12 hours 01/16/14 1657 01/17/14 0820   01/17/14 0000  piperacillin-tazobactam (ZOSYN) IVPB 3.375 g  Status:  Discontinued     3.375 g  12.5 mL/hr over 240 Minutes Intravenous Every 8 hours 01/16/14 1657 01/17/14 0820   01/16/14 1645  piperacillin-tazobactam (ZOSYN) IVPB 3.375 g     3.375 g 100 mL/hr over 30 Minutes Intravenous  Once 01/16/14 1642 01/16/14 1926   01/16/14 1645  vancomycin (VANCOCIN) IVPB 1000 mg/200 mL premix     1,000 mg 200 mL/hr over 60 Minutes Intravenous  Once 01/16/14 1642 01/16/14 1926       PHYSICAL  EXAM: Vital signs in last 24 hours: Filed Vitals:   01/20/14 1900 01/20/14 2015 01/21/14 0514 01/21/14 1001  BP: 139/42 158/66 166/76 117/65  Pulse: 79 84 94 91  Temp: 98.4 F (36.9 C) 97.9 F (36.6 C) 98.6 F (37 C)   TempSrc: Oral Oral Oral   Resp: 18 18 18    Height:      Weight:   78.472 kg (173 lb)   SpO2: 93% 95% 97%     Weight change: -2.828 kg (-6 lb 3.7 oz) Filed Weights   01/19/14 0500 01/20/14 0452 01/21/14 0514  Weight: 81.3 kg (179 lb 3.7 oz) 81.3 kg (179 lb 3.7 oz) 78.472 kg (173 lb)   Body mass index is 24.82 kg/(m^2).   Gen Exam: Awake and alert with clear speech.   Neck: Supple, No JVD.   Chest: B/L Clear.  No rales or rhonchi CVS: S1 S2 Regular Abdomen: soft, BS +, non tender, non distended.  Extremities: no edema, lower extremities warm to touch. S/P Right BKA-right lef in a bandage -did not open. Neurologic: Non Focal.   Skin: No Rash.   Wounds: [name].   Intake/Output from previous day:  Intake/Output Summary (Last 24 hours) at 01/21/14 1228 Last data filed at 01/21/14 0946  Gross per 24 hour  Intake 1284.83 ml  Output   2451 ml  Net -1166.17 ml     LAB RESULTS: CBC  Recent Labs Lab 01/16/14 1818 01/17/14 0253 01/18/14 0548 01/20/14 0537 01/21/14 0435  WBC 9.0 7.9 8.0 5.6 8.6  HGB 9.2* 8.6* 8.4* 8.4* 8.7*  HCT 27.4* 25.5* 25.8* 24.8* 26.5*  PLT 239 218 244 214 268  MCV 81.3 81.2 81.9 82.4 80.8  MCH 27.3 27.4 26.7 27.9 26.5  MCHC 33.6 33.7 32.6 33.9 32.8  RDW 13.8 13.9 14.1 14.1 14.5  LYMPHSABS 1.2  --   --  1.3  --   MONOABS 0.6  --   --  0.4  --   EOSABS 0.1  --   --  0.2  --   BASOSABS 0.0  --   --  0.0  --     Chemistries   Recent Labs Lab 01/16/14 1623 01/17/14 0253 01/18/14 0548 01/20/14 0537  NA 131* 132* 132* 136*  K 4.7 4.2 4.2 4.2  CL 93* 97 96 99  CO2 26 23 25 26   GLUCOSE 191* 142* 192* 162*  BUN 17 16 17 22   CREATININE 1.19 1.12 1.27 1.14  CALCIUM 9.4 8.6 8.3* 8.5    CBG:  Recent Labs Lab  01/20/14 1151 01/20/14 1659 01/20/14 2152 01/21/14 0800 01/21/14 1215  GLUCAP 153* 169* 176* 114* 221*    GFR Estimated Creatinine Clearance: 56.9 ml/min (by C-G formula based on Cr of 1.14).  Coagulation profile No results found for this basename: INR, PROTIME,  in the last 168 hours  Cardiac Enzymes No results found for this basename: CK, CKMB, TROPONINI, MYOGLOBIN,  in the last 168 hours  No components found with this basename: POCBNP,  No results found for  this basename: DDIMER,  in the last 72 hours No results found for this basename: HGBA1C,  in the last 72 hours No results found for this basename: CHOL, HDL, LDLCALC, TRIG, CHOLHDL, LDLDIRECT,  in the last 72 hours No results found for this basename: TSH, T4TOTAL, FREET3, T3FREE, THYROIDAB,  in the last 72 hours No results found for this basename: VITAMINB12, FOLATE, FERRITIN, TIBC, IRON, RETICCTPCT,  in the last 72 hours No results found for this basename: LIPASE, AMYLASE,  in the last 72 hours  Urine Studies No results found for this basename: UACOL, UAPR, USPG, UPH, UTP, UGL, UKET, UBIL, UHGB, UNIT, UROB, ULEU, UEPI, UWBC, URBC, UBAC, CAST, CRYS, UCOM, BILUA,  in the last 72 hours  MICROBIOLOGY: Recent Results (from the past 240 hour(s))  CULTURE, BLOOD (ROUTINE X 2)     Status: None   Collection Time    01/16/14  4:44 PM      Result Value Ref Range Status   Specimen Description BLOOD ARM LEFT   Final   Special Requests BOTTLES DRAWN AEROBIC AND ANAEROBIC 5CC   Final   Culture  Setup Time     Final   Value: 01/16/2014 22:48     Performed at [facility]   Culture     Final   Value:        BLOOD CULTURE RECEIVED NO GROWTH TO DATE CULTURE WILL BE HELD FOR 5 DAYS BEFORE ISSUING A FINAL NEGATIVE REPORT     Performed at [facility]   Report Status PENDING   Incomplete  CULTURE, BLOOD (ROUTINE X 2)     Status: None   Collection Time    01/16/14  4:45 PM      Result Value Ref Range Status   Specimen  Description BLOOD ARM RIGHT   Final   Special Requests BOTTLES DRAWN AEROBIC AND ANAEROBIC 10CC   Final   Culture  Setup Time     Final   Value: 01/16/2014 22:48     Performed at [facility]   Culture     Final   Value:        BLOOD CULTURE RECEIVED NO GROWTH TO DATE CULTURE WILL BE HELD FOR 5 DAYS BEFORE ISSUING A FINAL NEGATIVE REPORT     Performed at [facility]   Report Status PENDING   Incomplete  URINE CULTURE     Status: None   Collection Time    01/16/14  8:42 PM      Result Value Ref Range Status   Specimen Description URINE, RANDOM   Final   Special Requests NONE   Final   Culture  Setup Time     Final   Value: 01/17/2014 01:19     Performed at [place]     Final   Value: 65,000 COLONIES/ML     Performed at [facility]   Culture     Final   Value: Multiple bacterial morphotypes present, none predominant. Suggest appropriate recollection if clinically indicated.     Performed at [facility]   Report Status 01/17/2014 FINAL   Final  MRSA PCR SCREENING     Status: None   Collection Time    01/16/14 10:26 PM      Result Value Ref Range Status   MRSA by PCR NEGATIVE  NEGATIVE Final   Comment:            The GeneXpert MRSA Assay (FDA  approved for NASAL specimens     only), is one component of a     comprehensive MRSA colonization     surveillance program. It is not     intended to diagnose MRSA     infection nor to guide or     monitor treatment for     MRSA infections.  SURGICAL PCR SCREEN     Status: None   Collection Time    01/17/14  9:40 AM      Result Value Ref Range Status   MRSA, PCR NEGATIVE  NEGATIVE Final   Staphylococcus aureus NEGATIVE  NEGATIVE Final   Comment:            The Xpert SA Assay (FDA     approved for NASAL specimens     in patients over 29 years of age),     is one component of     a comprehensive surveillance     program.  Test performance has     been validated by  [employer] for patients greater     than or equal to 66 year old.     It is not intended     to diagnose infection nor to     guide or monitor treatment.  ANAEROBIC CULTURE     Status: None   Collection Time    01/17/14 12:09 PM      Result Value Ref Range Status   Specimen Description TISSUE BONE RIGHT   Final   Special Requests RIGHT CALCANEOUS BONE PT ON ZOSYN   Final   Gram Stain PENDING   Incomplete   Culture     Final   Value: NO ANAEROBES ISOLATED; CULTURE IN PROGRESS FOR 5 DAYS     Performed at [facility]   Report Status PENDING   Incomplete  TISSUE CULTURE     Status: None   Collection Time    01/17/14 12:09 PM      Result Value Ref Range Status   Specimen Description TISSUE BONE RIGHT   Final   Special Requests RIGHT CALCANEOUS BONE PT ON ZOSYN   Final   Gram Stain     Final   Value: RARE WBC PRESENT,BOTH PMN AND MONONUCLEAR     FEW GRAM POSITIVE COCCI IN PAIRS     Performed at [facility]   Culture     Final   Value: MODERATE GROUP B STREP(S.AGALACTIAE)ISOLATED     Note: TESTING AGAINST S. AGALACTIAE NOT ROUTINELY PERFORMED DUE TO PREDICTABILITY OF AMP/PEN/VAN SUSCEPTIBILITY.     Performed at [facility]   Report Status 01/19/2014 FINAL   Final    RADIOLOGY STUDIES/RESULTS: Dg Os Calcis Right  01/16/2014   CLINICAL DATA:  Large open wound over the calcaneus.  EXAM: RIGHT OS CALCIS - 2+ VIEW  COMPARISON:  None.  FINDINGS: There is a large soft tissue wound along the lateral margin calcaneus. Underlying bony destructive change in the posterior and lateral calcaneus is consistent with osteomyelitis. No radiopaque foreign body is identified. Vascular calcifications are noted.  IMPRESSION: Large skin ulceration over the lateral calcaneus with underlying osteomyelitis.   Electronically Signed   By: Inge Rise M.D.   On: 01/16/2014 17:42   Ct Foot Right W Contrast  12/25/2013   CLINICAL DATA:  Osteomyelitis.  EXAM: CT OF THE RIGHT FOOT  WITH CONTRAST  TECHNIQUE: Multidetector CT imaging was performed following the standard protocol during bolus administration of intravenous  contrast.  CONTRAST:  112mL OMNIPAQUE IOHEXOL 300 MG/ML  SOLN  COMPARISON:  DG OS CALCIS*R* dated 12/12/2013  FINDINGS: There is an ulcer along the lateral aspect of the calcaneus, extending to the cortical surface. There is rare fraction of bone and cortical osteolysis compatible with active osteomyelitis in a lateral calcaneus deep to the ulcer. Gas extends through the ulcer tract to the bone surface. There midfoot erosions in the medial cuneiform bone which may be associated with the patient's history of gout arthritis or ordinary degenerative disease. First MTP joint osteoarthritis is mild. Old avulsion fragments are present adjacent to the medial malleolus in the deltoid ligament. Calcification of the posterior talofibular ligament is incidentally noted. No soft tissue abscess is identified. Grossly, the Achilles tendon and plantar fascia appear within normal limits.  IMPRESSION: Lateral hindfoot ulceration with osteomyelitis of the lateral calcaneal tuberosity (image 28 series 3). No soft tissue abscess.   Electronically Signed   By: Dereck Ligas M.D.   On: 12/25/2013 15:46    Oren Binet, MD  [facility] Pager:[phone]  If 7PM-7AM, please contact night-coverage [email or web address] Password [email or web address] 01/21/2014, 12:28 PM   LOS: 5 days

## 2014-01-21 NOTE — Progress Notes (Signed)
Physical Therapy Treatment Patient Details Name: [name] MRN: [id number] DOB: 03-01-1935 Today's Date: 01/21/2014 Time: 1245-8099 PT Time Calculation (min): 43 min  PT Assessment / Plan / Recommendation  History of Present Illness Pt adm with osteomyelitis of rt calcaneous. Initially underwent debridement and bone biopsy but refused BKA. Pt changed his mind and underwent rt BKA on 01/20/14.   PT Comments   Pt re-evaluated after rt BKA. Some initial goals met and remaining goals remain appropriate.  Instructed pt in positioning of rt leg and in quad sets.  Pt with significant saturation of dressings with blood. Dressings had already been reinforced and I reinforced with 2 ABD pads and 2 kerlix wraps. Nursing aware. Feel pt will need ST-SNF prior to return home with wife.  Follow Up Recommendations  SNF     Does the patient have the potential to tolerate intense rehabilitation     Barriers to Discharge        Equipment Recommendations  Rolling walker with 5" wheels;Wheelchair (measurements PT);Wheelchair cushion (measurements PT);3in1 (PT)    Recommendations for Other Services    Frequency Min 3X/week   Progress towards PT Goals Progress towards PT goals: Progressing toward goals  Plan Current plan remains appropriate    Precautions / Restrictions Precautions Precautions: Fall Restrictions Weight Bearing Restrictions: Yes RLE Weight Bearing: Non weight bearing   Pertinent Vitals/Pain Soreness of rt leg.    Mobility  Bed Mobility Overal bed mobility: Needs Assistance Bed Mobility: Supine to Sit Supine to sit: Min assist;HOB elevated General bed mobility comments: Assist to move rt leg and to raise trunk. Transfers Overall transfer level: Needs assistance Equipment used:  Charlaine Dalton) Transfers: Sit to/from [employer] to Stand: +2 physical assistance;Mod assist Stand pivot transfers: +2 physical assistance;Min assist General transfer comment: Used  Stedy for sit to stand and for pivot to chair. Pt able to achieve full standing on stedy.    Exercises Amputee Exercises Quad Sets: AAROM;Strengthening;Right;5 reps;Supine   PT Diagnosis:    PT Problem List:   PT Treatment Interventions:     PT Goals (current goals can now be found in the care plan section)    Visit Information  Last PT Received On: 01/21/14 Assistance Needed: +2 History of Present Illness: Pt adm with osteomyelitis of rt calcaneous. Initially underwent debridement and bone biopsy but refused BKA. Pt changed his mind and underwent rt BKA on 01/20/14.    Subjective Data      Cognition  Cognition Arousal/Alertness: Awake/alert Behavior During Therapy: WFL for tasks assessed/performed Overall Cognitive Status: Within Functional Limits for tasks assessed    Balance  Balance Sitting-balance support: No upper extremity supported;Feet unsupported Sitting balance-Leahy Scale: Normal Standing balance support: Bilateral upper extremity supported Standing balance-Leahy Scale: Poor Standing balance comment: Stood x 2 for 1-2 minutes with Stedy with min A to maintain.  End of Session PT - End of Session Equipment Utilized During Treatment: Gait belt;Other (comment) Charlaine Dalton) Activity Tolerance: Patient tolerated treatment well Patient left: in chair;with call bell/phone within reach Nurse Communication: Mobility status;Need for lift equipment   GP     [name] 01/21/2014, 11:48 AM  [facility] PT [phone]

## 2014-01-21 NOTE — Discharge Instructions (Signed)
Information on my medicine - ELIQUIS® (apixaban) °Why was Eliquis® prescribed for you? °Eliquis® was prescribed for you to reduce the risk of a blood clot forming that can cause a stroke if you have a medical condition called atrial fibrillation (a type of irregular heartbeat). ° °What do You need to know about Eliquis® ? °Take your Eliquis® TWICE DAILY - one tablet in the morning and one tablet in the evening with or without food. If you have difficulty swallowing the tablet whole please discuss with your pharmacist how to take the medication safely. ° °Take Eliquis® exactly as prescribed by your doctor and DO NOT stop taking Eliquis® without talking to the doctor who prescribed the medication.  Stopping may increase your risk of developing a stroke.  Refill your prescription before you run out. ° °After discharge, you should have regular check-up appointments with your healthcare provider that is prescribing your Eliquis®.  In the future your dose may need to be changed if your kidney function or weight changes by a significant amount or as you get older. ° °What do you do if you miss a dose? °If you miss a dose, take it as soon as you remember on the same day and resume taking twice daily.  Do not take more than one dose of ELIQUIS at the same time to make up a missed dose. ° °Important Safety Information °A possible side effect of Eliquis® is bleeding. You should call your healthcare provider right away if you experience any of the following: °  Bleeding from an injury or your nose that does not stop. °  Unusual colored urine (red or dark brown) or unusual colored stools (red or black). °  Unusual bruising for unknown reasons. °  A serious fall or if you hit your head (even if there is no bleeding). ° °Some medicines may interact with Eliquis® and might increase your risk of bleeding or clotting while on Eliquis®. To help avoid this, consult your healthcare provider or pharmacist prior to using any new  prescription or non-prescription medications, including herbals, vitamins, non-steroidal anti-inflammatory drugs (NSAIDs) and supplements. ° °This website has more information on Eliquis® (apixaban): www.Eliquis.com. ° °

## 2014-01-21 NOTE — Progress Notes (Signed)
Patient ID: [name], male   DOB: [birth year], 79 y.o.   MRN: [id number] Postoperative day 1 right transtibial amputation. Patient's hemoglobin slightly elevated after transfusion of one unit packed red blood cells. Patient had bleeding through the dressing yesterday and I will change the dressing tomorrow. Physical therapy progressive ambulation nonweightbearing on the right with placement in to skilled nursing being arranged. Orders written for nutritional protein supplement.

## 2014-01-21 NOTE — Clinical Documentation Improvement (Signed)
To Plastic and Reconstructive Surgery MD's  Please clarify documentation in the medical record of "debridement."  Please document the below (2) key elements in a op note if appropriate :    Excisional vs. nonexcisional?   Document the size of the debridement.   Thank You,  Ree Kida ,RN Clinical Documentation Specialist:  [facility]

## 2014-01-21 NOTE — Progress Notes (Signed)
Pt ace dressing to right BKA presents with bloody drainage, linen saturated with blood. RN reinforced w/ kerlex and paged NP on call who advised RN to notify orthopedic on call for Dr. Sharol Given. RN spoke w/ Dr. Lorin Mercy, on call for Dr. Sharol Given, who instructed RN to reinforce dressing with 4 ABD pads, kerlex, and a 6inch ace wrap. Will continue to monitor pt.

## 2014-01-21 NOTE — Progress Notes (Signed)
Pt having a moderate amount of drainage around right BKA dressing. Dr. Sharol Given notified and per MD, RN to take of old dressing and apply new one. Pt was given pain medication before dressing change and dressing was removed. Upon removing dressing, a large blood clot was noted and a moderate amount of  drainage Pt had some blood trickling down above incision (above 1inch where some swelling was pulling on a stitch). In addition pt has some trickling of blood near the lateral side of incision.  Dr. Sharol Given was notified of findings and MD said to continue to compress site with Ace bandages. Also, eliquis was discontinued. Pt dressing was reapplied. 4 packs of gauze, 3 abd pds, 2 curlex, and 2 ace bandages were applied. Will continue to monitor site.

## 2014-01-22 ENCOUNTER — Encounter (HOSPITAL_COMMUNITY): Payer: Self-pay | Admitting: Orthopedic Surgery

## 2014-01-22 ENCOUNTER — Encounter (HOSPITAL_BASED_OUTPATIENT_CLINIC_OR_DEPARTMENT_OTHER): Admission: RE | Payer: Self-pay | Source: Ambulatory Visit

## 2014-01-22 ENCOUNTER — Other Ambulatory Visit: Payer: Self-pay | Admitting: Orthopedic Surgery

## 2014-01-22 ENCOUNTER — Ambulatory Visit (HOSPITAL_BASED_OUTPATIENT_CLINIC_OR_DEPARTMENT_OTHER): Admission: RE | Admit: 2014-01-22 | Payer: Medicare Other | Source: Ambulatory Visit | Admitting: Plastic Surgery

## 2014-01-22 DIAGNOSIS — L0390 Cellulitis, unspecified: Secondary | ICD-10-CM

## 2014-01-22 DIAGNOSIS — L0291 Cutaneous abscess, unspecified: Secondary | ICD-10-CM

## 2014-01-22 DIAGNOSIS — R1310 Dysphagia, unspecified: Secondary | ICD-10-CM

## 2014-01-22 DIAGNOSIS — L03119 Cellulitis of unspecified part of limb: Secondary | ICD-10-CM

## 2014-01-22 DIAGNOSIS — L02419 Cutaneous abscess of limb, unspecified: Secondary | ICD-10-CM

## 2014-01-22 LAB — GLUCOSE, CAPILLARY
Glucose-Capillary: 141 mg/dL — ABNORMAL HIGH (ref 70–99)
Glucose-Capillary: 234 mg/dL — ABNORMAL HIGH (ref 70–99)
Glucose-Capillary: 219 mg/dL — ABNORMAL HIGH (ref 70–99)
Glucose-Capillary: 219 mg/dL — ABNORMAL HIGH (ref 70–99)

## 2014-01-22 LAB — CBC
HCT: 24.3 % — ABNORMAL LOW (ref 39.0–52.0)
Hemoglobin: 8.1 g/dL — ABNORMAL LOW (ref 13.0–17.0)
MCH: 26.8 pg (ref 26.0–34.0)
MCHC: 33.3 g/dL (ref 30.0–36.0)
MCV: 80.5 fL (ref 78.0–100.0)
Platelets: 275 10*3/uL (ref 150–400)
RBC: 3.02 MIL/uL — AB (ref 4.22–5.81)
RDW: 14.5 % (ref 11.5–15.5)
WBC: 9.9 10*3/uL (ref 4.0–10.5)

## 2014-01-22 LAB — CULTURE, BLOOD (ROUTINE X 2)
Culture: NO GROWTH
Culture: NO GROWTH

## 2014-01-22 LAB — BASIC METABOLIC PANEL
BUN: 18 mg/dL (ref 6–23)
CHLORIDE: 99 meq/L (ref 96–112)
CO2: 27 meq/L (ref 19–32)
Calcium: 8.6 mg/dL (ref 8.4–10.5)
Creatinine, Ser: 1.09 mg/dL (ref 0.50–1.35)
GFR calc Af Amer: 74 mL/min — ABNORMAL LOW (ref >90)
GFR calc non Af Amer: 64 mL/min — ABNORMAL LOW (ref >90)
Glucose, Bld: 148 mg/dL — ABNORMAL HIGH (ref 70–99)
Potassium: 4.3 mEq/L (ref 3.7–5.3)
Sodium: 137 mEq/L (ref 137–147)

## 2014-01-22 SURGERY — IRRIGATION AND DEBRIDEMENT EXTREMITY
Anesthesia: General | Site: Foot | Laterality: Right

## 2014-01-22 MED ORDER — POTASSIUM CHLORIDE 20 MEQ PO PACK
60.0000 meq | PACK | Freq: Four times a day (QID) | ORAL | Status: DC
  Filled 2014-01-22 (×2): qty 3

## 2014-01-22 MED ORDER — HYDRALAZINE HCL 20 MG/ML IJ SOLN
5.0000 mg | Freq: Once | INTRAMUSCULAR | Status: AC
  Administered 2014-01-23: 5 mg via INTRAVENOUS
  Filled 2014-01-22: qty 0.25

## 2014-01-22 NOTE — Progress Notes (Signed)
Physical Therapy Treatment Patient Details Name: [name] MRN: [id number] DOB: 08/17/1935 Today's Date: 01/22/2014 Time: 0998-3382 PT Time Calculation (min): 30 min  PT Assessment / Plan / Recommendation  History of Present Illness Pt adm with osteomyelitis of rt calcaneous. Initially underwent debridement and bone biopsy but refused BKA. Pt changed his mind and underwent rt BKA on 01/20/14.   PT Comments   Pt very anxious with all mobility. Requires max cues and encouragement for all mobility. 2 person (A) required for stand pivot transfer to 3 in 1 and to recliner. Will cont to follow per POC.   Follow Up Recommendations  SNF     Does the patient have the potential to tolerate intense rehabilitation     Barriers to Discharge        Equipment Recommendations  Rolling walker with 5" wheels;Wheelchair (measurements PT);Wheelchair cushion (measurements PT);3in1 (PT)    Recommendations for Other Services    Frequency Min 3X/week   Progress towards PT Goals Progress towards PT goals: Progressing toward goals  Plan Current plan remains appropriate    Precautions / Restrictions Precautions Precautions: Fall Restrictions Weight Bearing Restrictions: Yes RLE Weight Bearing: Non weight bearing   Pertinent Vitals/Pain Stable; pain 4/10 during session. patient repositioned for comfort     Mobility  Bed Mobility Overal bed mobility: Needs Assistance Bed Mobility: Supine to Sit Supine to sit: Min assist;HOB elevated General bed mobility comments: (A) to advance Rt LE; max directional cues due to pt being anxious.  Transfers Overall transfer level: Needs assistance Equipment used: 2 person hand held assist Transfers: Sit to/from [facility] Sit to Stand: +2 physical assistance;Max assist Stand pivot transfers: +2 physical assistance;Total assist General transfer comment: pt able to clear hips and achieve upright standing position today with 2 person (A); max  directional cues for sequencing and encouragement due to being very anxious; pt performed SPT bed  to 3 in 1 to recliner; requires total (A) to faclilitate weightshift for pivotal step to chair;          PT Diagnosis:    PT Problem List:   PT Treatment Interventions:     PT Goals (current goals can now be found in the care plan section) Acute Rehab PT Goals Patient Stated Goal: rehab and then home PT Goal Formulation: With patient Time For Goal Achievement: 02/01/14 Potential to Achieve Goals: Good  Visit Information  Last PT Received On: 01/22/14 Assistance Needed: +2 History of Present Illness: Pt adm with osteomyelitis of rt calcaneous. Initially underwent debridement and bone biopsy but refused BKA. Pt changed his mind and underwent rt BKA on 01/20/14.    Subjective Data  Subjective: pt lying supine; relunctant initially to participate; agreeable and stated "well since we are going to do this lets just get on the pot for a bowel movement"  Patient Stated Goal: rehab and then home   Cognition  Cognition Arousal/Alertness: Awake/alert Behavior During Therapy: Anxious Overall Cognitive Status: Within Functional Limits for tasks assessed    Balance  Balance Overall balance assessment: Needs assistance Sitting-balance support: Feet supported;Bilateral upper extremity supported (Lt LE supported ) Sitting balance-Leahy Scale: Good Standing balance support: During functional activity;Bilateral upper extremity supported Standing balance-Leahy Scale: Zero Standing balance comment: requires total (A) to maintain standing balance; pt requires total (A) of one to achieve partial standing and tolerated ~30 seconds while second person performed pernieal hygiene   General Comments General comments (skin integrity, edema, etc.): pt with +BM during session with  incr time; RN notified   End of Session PT - End of Session Equipment Utilized During Treatment: Gait belt Activity Tolerance:  Patient tolerated treatment well Patient left: in chair;with call bell/phone within reach Nurse Communication: Mobility status;Need for lift equipment   GP     Gustavus Bryant, [place] 01/22/2014, 4:28 PM

## 2014-01-22 NOTE — Progress Notes (Signed)
Inpatient Diabetes Program Recommendations  AACE/ADA: New Consensus Statement on Inpatient Glycemic Control (2013)  Target Ranges:  Prepandial:   less than 140 mg/dL      Peak postprandial:   less than 180 mg/dL (1-2 hours)      Critically ill patients:  140 - 180 mg/dL     Results for COLBY, REELS (MRN [id number]) as of 01/22/2014 09:52  Ref. Range 01/21/2014 08:00 01/21/2014 12:15 01/21/2014 17:38 01/21/2014 21:30  Glucose-Capillary Latest Range: 70-99 mg/dL 114 (H) 221 (H) 209 (H) 196 (H)    **POD #2 transtibial amputation.  To d/c to SNF when ready.  **Patient had elevated postprandial CBGs yesterday.  Eating 100% of meals.   **MD- Please consider adding Novolog meal coverage- Novolog 3 units tid with meals   Will follow. Wyn Quaker RN, MSN, CDE Diabetes Coordinator Inpatient Diabetes Program Team Pager: [phone] (8a-10p)

## 2014-01-22 NOTE — Progress Notes (Signed)
PATIENT DETAILS Name: [name] Age: 79 y.o. Sex: male Date of Birth: [birth year] Admit Date: 01/16/2014 Admitting Physician Theodis Blaze, MD [id number] EDWARD, MD  Subjective: No major events overnight. Per Ortho stay over night for further observation for bleeding.  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 Postoperatively, patient changed his mind and now was agreeable to undergo a right BKA. Dr. Sharol Given subsequently took the patient for a BKA on 3/2. ID has now recommended we discontinue all antibiotics.   Atrial fibrillation - Continue with Eliquis -however briefly held for OR on 3/1-spoke with Dr Sharol Given ok to resume  Diabetes - CBGs stable - Continue with SSI and Lantus - Hold metformin while inpatient  Chronic diastolic failure - Clinically compensated - Continue Lasix at half of home dose  Anemia -suspect 2/2 Chronic disease -Ortho wanting to transfuse 1 unit of PRBC  Disposition: Remain inpatient-SNF on discharge-likely on 3/5  DVT Prophylaxis: Not needed as an Eliquis  Code Status: Full code   Family Communication None at bedside  Procedures:  None  CONSULTS:  ID, orthopedic surgery and Plastics   MEDICATIONS: Scheduled Meds: . digoxin  0.125 mg Oral Daily  . diltiazem  180 mg Oral Daily  . feeding supplement (GLUCERNA SHAKE)  237 mL Oral TID BM  . furosemide  40 mg Oral Daily  . insulin aspart  0-5 Units Subcutaneous QHS  . insulin aspart  0-9 Units Subcutaneous TID WC  . insulin glargine  10 Units Subcutaneous QHS  . pantoprazole  40 mg Oral Daily    Continuous Infusions: . sodium chloride 1,000 mL (01/20/14 1123)   PRN Meds:.HYDROmorphone (DILAUDID) injection, HYDROmorphone (DILAUDID) injection, ibuprofen, methocarbamol (ROBAXIN) IV, methocarbamol, metoCLOPramide (REGLAN) injection, metoCLOPramide, ondansetron (ZOFRAN) IV, ondansetron, oxyCODONE, oxyCODONE-acetaminophen, zolpidem  Antibiotics: Anti-infectives   Start     Dose/Rate Route Frequency Ordered Stop   01/20/14 1400  ceFAZolin (ANCEF) IVPB 1 g/50 mL premix     1 g 100 mL/hr over 30 Minutes Intravenous Every 6 hours 01/20/14 1038 01/21/14 0146   01/17/14 1600  piperacillin-tazobactam (ZOSYN) IVPB 3.375 g  Status:  Discontinued     3.375 g 12.5 mL/hr over 240 Minutes Intravenous Every 8 hours 01/17/14 1335 01/20/14 1703   01/17/14 1500  vancomycin (VANCOCIN) IVPB 750 mg/150 ml premix  Status:  Discontinued     750 mg 150 mL/hr over 60 Minutes Intravenous Every 12 hours 01/17/14 1335 01/19/14 1102   01/17/14 1218  polymyxin B 500,000 Units, bacitracin 50,000 Units in sodium chloride irrigation 0.9 % 500 mL irrigation  Status:  Discontinued       As needed 01/17/14 1218 01/17/14 1237   01/17/14 1100  piperacillin-tazobactam (ZOSYN) IVPB 3.375 g  Status:  Discontinued     3.375 g 100 mL/hr over 30 Minutes Intravenous  Once 01/17/14 1055 01/17/14 1405   01/17/14 0800  vancomycin (VANCOCIN) IVPB 750 mg/150 ml premix  Status:  Discontinued     750 mg 150 mL/hr over 60 Minutes Intravenous Every 12 hours 01/16/14 1657 01/17/14 0820   01/17/14 0000  piperacillin-tazobactam (ZOSYN) IVPB 3.375 g  Status:  Discontinued  3.375 g 12.5 mL/hr over 240 Minutes Intravenous Every 8 hours 01/16/14 1657 01/17/14 0820   01/16/14 1645  piperacillin-tazobactam (ZOSYN) IVPB 3.375 g     3.375 g 100 mL/hr over 30 Minutes Intravenous  Once 01/16/14 1642 01/16/14 1926   01/16/14 1645  vancomycin (VANCOCIN) IVPB 1000 mg/200 mL premix     1,000 mg 200 mL/hr over 60 Minutes Intravenous  Once  01/16/14 1642 01/16/14 1926       PHYSICAL EXAM: Vital signs in last 24 hours: Filed Vitals:   01/22/14 0500 01/22/14 0608 01/22/14 0953 01/22/14 1306  BP:  172/79 158/57 156/72  Pulse:  86 86 78  Temp:  99.2 F (37.3 C)  98.7 F (37.1 C)  TempSrc:  Oral  Oral  Resp:  18  20  Height:      Weight: 77.565 kg (171 lb)     SpO2:  95%  98%    Weight change: -0.907 kg (-2 lb) Filed Weights   01/20/14 0452 01/21/14 0514 01/22/14 0500  Weight: 81.3 kg (179 lb 3.7 oz) 78.472 kg (173 lb) 77.565 kg (171 lb)   Body mass index is 24.54 kg/(m^2).   Gen Exam: Awake and alert with clear speech.   Neck: Supple, No JVD.   Chest: B/L Clear.  No rales or rhonchi CVS: S1 S2 Regular Abdomen: soft, BS +, non tender, non distended.  Extremities: no edema, lower extremities warm to touch. S/P Right BKA-right lef in a bandage -did not open. Neurologic: Non Focal.   Skin: No Rash.   Wounds: [name].   Intake/Output from previous day:  Intake/Output Summary (Last 24 hours) at 01/22/14 1417 Last data filed at 01/22/14 1307  Gross per 24 hour  Intake    480 ml  Output    800 ml  Net   -320 ml     LAB RESULTS: CBC  Recent Labs Lab 01/16/14 1818 01/17/14 0253 01/18/14 0548 01/20/14 0537 01/21/14 0435 01/22/14 0701  WBC 9.0 7.9 8.0 5.6 8.6 9.9  HGB 9.2* 8.6* 8.4* 8.4* 8.7* 8.1*  HCT 27.4* 25.5* 25.8* 24.8* 26.5* 24.3*  PLT 239 218 244 214 268 275  MCV 81.3 81.2 81.9 82.4 80.8 80.5  MCH 27.3 27.4 26.7 27.9 26.5 26.8  MCHC 33.6 33.7 32.6 33.9 32.8 33.3  RDW 13.8 13.9 14.1 14.1 14.5 14.5  LYMPHSABS 1.2  --   --  1.3  --   --   MONOABS 0.6  --   --  0.4  --   --   EOSABS 0.1  --   --  0.2  --   --   BASOSABS 0.0  --   --  0.0  --   --     Chemistries   Recent Labs Lab 01/16/14 1623 01/17/14 0253 01/18/14 0548 01/20/14 0537 01/22/14 0701  NA 131* 132* 132* 136* 137  K 4.7 4.2 4.2 4.2 4.3  CL 93* 97 96 99 99  CO2 26 23 25 26 27   GLUCOSE 191* 142* 192* 162* 148*  BUN 17 16  17 22 18   CREATININE 1.19 1.12 1.27 1.14 1.09  CALCIUM 9.4 8.6 8.3* 8.5 8.6    CBG:  Recent Labs Lab 01/21/14 1215 01/21/14 1738 01/21/14 2130 01/22/14 0743 01/22/14 1156  GLUCAP 221* 209* 196* 141* 234*    GFR Estimated Creatinine Clearance: 59.5 ml/min (by C-G formula based on Cr of 1.09).  Coagulation profile No results found for this basename: INR, PROTIME,  in the last 168  hours  Cardiac Enzymes No results found for this basename: CK, CKMB, TROPONINI, MYOGLOBIN,  in the last 168 hours  No components found with this basename: POCBNP,  No results found for this basename: DDIMER,  in the last 72 hours No results found for this basename: HGBA1C,  in the last 72 hours No results found for this basename: CHOL, HDL, LDLCALC, TRIG, CHOLHDL, LDLDIRECT,  in the last 72 hours No results found for this basename: TSH, T4TOTAL, FREET3, T3FREE, THYROIDAB,  in the last 72 hours No results found for this basename: VITAMINB12, FOLATE, FERRITIN, TIBC, IRON, RETICCTPCT,  in the last 72 hours No results found for this basename: LIPASE, AMYLASE,  in the last 72 hours  Urine Studies No results found for this basename: UACOL, UAPR, USPG, UPH, UTP, UGL, UKET, UBIL, UHGB, UNIT, UROB, ULEU, UEPI, UWBC, URBC, UBAC, CAST, CRYS, UCOM, BILUA,  in the last 72 hours  MICROBIOLOGY: Recent Results (from the past 240 hour(s))  CULTURE, BLOOD (ROUTINE X 2)     Status: None   Collection Time    01/16/14  4:44 PM      Result Value Ref Range Status   Specimen Description BLOOD ARM LEFT   Final   Special Requests BOTTLES DRAWN AEROBIC AND ANAEROBIC 5CC   Final   Culture  Setup Time     Final   Value: 01/16/2014 22:48     Performed at [facility]   Culture     Final   Value: NO GROWTH 5 DAYS     Performed at [facility]   Report Status 01/22/2014 FINAL   Final  CULTURE, BLOOD (ROUTINE X 2)     Status: None   Collection Time    01/16/14  4:45 PM      Result Value Ref Range Status     Specimen Description BLOOD ARM RIGHT   Final   Special Requests BOTTLES DRAWN AEROBIC AND ANAEROBIC 10CC   Final   Culture  Setup Time     Final   Value: 01/16/2014 22:48     Performed at [facility]   Culture     Final   Value: NO GROWTH 5 DAYS     Performed at [facility]   Report Status 01/22/2014 FINAL   Final  URINE CULTURE     Status: None   Collection Time    01/16/14  8:42 PM      Result Value Ref Range Status   Specimen Description URINE, RANDOM   Final   Special Requests NONE   Final   Culture  Setup Time     Final   Value: 01/17/2014 01:19     Performed at [place]     Final   Value: 65,000 COLONIES/ML     Performed at [facility]   Culture     Final   Value: Multiple bacterial morphotypes present, none predominant. Suggest appropriate recollection if clinically indicated.     Performed at [facility]   Report Status 01/17/2014 FINAL   Final  MRSA PCR SCREENING     Status: None   Collection Time    01/16/14 10:26 PM      Result Value Ref Range Status   MRSA by PCR NEGATIVE  NEGATIVE Final   Comment:            The GeneXpert MRSA Assay (FDA     approved for NASAL specimens  only), is one component of a     comprehensive MRSA colonization     surveillance program. It is not     intended to diagnose MRSA     infection nor to guide or     monitor treatment for     MRSA infections.  SURGICAL PCR SCREEN     Status: None   Collection Time    01/17/14  9:40 AM      Result Value Ref Range Status   MRSA, PCR NEGATIVE  NEGATIVE Final   Staphylococcus aureus NEGATIVE  NEGATIVE Final   Comment:            The Xpert SA Assay (FDA     approved for NASAL specimens     in patients over 62 years of age),     is one component of     a comprehensive surveillance     program.  Test performance has     been validated by [employer] for patients greater     than or equal to 30 year old.     It is not  intended     to diagnose infection nor to     guide or monitor treatment.  ANAEROBIC CULTURE     Status: None   Collection Time    01/17/14 12:09 PM      Result Value Ref Range Status   Specimen Description TISSUE BONE RIGHT   Final   Special Requests RIGHT CALCANEOUS BONE PT ON ZOSYN   Final   Gram Stain PENDING   Incomplete   Culture     Final   Value: NO ANAEROBES ISOLATED; CULTURE IN PROGRESS FOR 5 DAYS     Performed at [facility]   Report Status PENDING   Incomplete  TISSUE CULTURE     Status: None   Collection Time    01/17/14 12:09 PM      Result Value Ref Range Status   Specimen Description TISSUE BONE RIGHT   Final   Special Requests RIGHT CALCANEOUS BONE PT ON ZOSYN   Final   Gram Stain     Final   Value: RARE WBC PRESENT,BOTH PMN AND MONONUCLEAR     FEW GRAM POSITIVE COCCI IN PAIRS     Performed at [facility]   Culture     Final   Value: MODERATE GROUP B STREP(S.AGALACTIAE)ISOLATED     Note: TESTING AGAINST S. AGALACTIAE NOT ROUTINELY PERFORMED DUE TO PREDICTABILITY OF AMP/PEN/VAN SUSCEPTIBILITY.     Performed at [facility]   Report Status 01/19/2014 FINAL   Final    RADIOLOGY STUDIES/RESULTS: Dg Os Calcis Right  01/16/2014   CLINICAL DATA:  Large open wound over the calcaneus.  EXAM: RIGHT OS CALCIS - 2+ VIEW  COMPARISON:  None.  FINDINGS: There is a large soft tissue wound along the lateral margin calcaneus. Underlying bony destructive change in the posterior and lateral calcaneus is consistent with osteomyelitis. No radiopaque foreign body is identified. Vascular calcifications are noted.  IMPRESSION: Large skin ulceration over the lateral calcaneus with underlying osteomyelitis.   Electronically Signed   By: Inge Rise M.D.   On: 01/16/2014 17:42   Ct Foot Right W Contrast  12/25/2013   CLINICAL DATA:  Osteomyelitis.  EXAM: CT OF THE RIGHT FOOT WITH CONTRAST  TECHNIQUE: Multidetector CT imaging was performed following the standard  protocol during bolus administration of intravenous contrast.  CONTRAST:  136mL OMNIPAQUE IOHEXOL 300  MG/ML  SOLN  COMPARISON:  DG OS CALCIS*R* dated 12/12/2013  FINDINGS: There is an ulcer along the lateral aspect of the calcaneus, extending to the cortical surface. There is rare fraction of bone and cortical osteolysis compatible with active osteomyelitis in a lateral calcaneus deep to the ulcer. Gas extends through the ulcer tract to the bone surface. There midfoot erosions in the medial cuneiform bone which may be associated with the patient's history of gout arthritis or ordinary degenerative disease. First MTP joint osteoarthritis is mild. Old avulsion fragments are present adjacent to the medial malleolus in the deltoid ligament. Calcification of the posterior talofibular ligament is incidentally noted. No soft tissue abscess is identified. Grossly, the Achilles tendon and plantar fascia appear within normal limits.  IMPRESSION: Lateral hindfoot ulceration with osteomyelitis of the lateral calcaneal tuberosity (image 28 series 3). No soft tissue abscess.   Electronically Signed   By: Dereck Ligas M.D.   On: 12/25/2013 15:46    Ermie Glendenning A, MD  [facility] Pager:[phone]  If 7PM-7AM, please contact night-coverage [email or web address] Password [email or web address] 01/22/2014, 2:17 PM   LOS: 6 days

## 2014-01-22 NOTE — Progress Notes (Signed)
Patient ID: [name], male   DOB: 1935-03-10, 79 y.o.   MRN: [id number] Postoperative day 2 right transtibial amputation. Dressing clean and dry.  Patient is extremely anxious about discharge to skilled nursing. He feels like he is in an increased risk of  bleeding.  Patient would like to stay at least through today to ensure there is no further bleeding and discharged to skilled nursing tomorrow. I feel this is appropriate.

## 2014-01-23 DIAGNOSIS — M549 Dorsalgia, unspecified: Secondary | ICD-10-CM

## 2014-01-23 LAB — CBC
HCT: 25.7 % — ABNORMAL LOW (ref 39.0–52.0)
HEMOGLOBIN: 8.4 g/dL — AB (ref 13.0–17.0)
MCH: 26.5 pg (ref 26.0–34.0)
MCHC: 32.7 g/dL (ref 30.0–36.0)
MCV: 81.1 fL (ref 78.0–100.0)
Platelets: 275 10*3/uL (ref 150–400)
RBC: 3.17 MIL/uL — AB (ref 4.22–5.81)
RDW: 14.3 % (ref 11.5–15.5)
WBC: 10.3 10*3/uL (ref 4.0–10.5)

## 2014-01-23 LAB — GLUCOSE, CAPILLARY
GLUCOSE-CAPILLARY: 223 mg/dL — AB (ref 70–99)
GLUCOSE-CAPILLARY: 194 mg/dL — AB (ref 70–99)
Glucose-Capillary: 149 mg/dL — ABNORMAL HIGH (ref 70–99)
Glucose-Capillary: 250 mg/dL — ABNORMAL HIGH (ref 70–99)

## 2014-01-23 MED ORDER — LISINOPRIL 5 MG PO TABS
5.0000 mg | ORAL_TABLET | Freq: Every day | ORAL | Status: DC
  Administered 2014-01-23 – 2014-01-24 (×2): 5 mg via ORAL
  Filled 2014-01-23 (×2): qty 1

## 2014-01-23 NOTE — Progress Notes (Signed)
PATIENT DETAILS Name: [name] Age: 79 y.o. Sex: male Date of Birth: June 19, 1935 Admit Date: 01/16/2014 Admitting Physician Theodis Blaze, MD [id number] EDWARD, MD  Subjective: No major events overnight. Patient says is uncomfortable leaving the hospital.  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 Postoperatively, patient changed his mind and now was agreeable to undergo a right BKA. Dr. Sharol Given subsequently took the patient for a BKA on 3/2. ID has now recommended we discontinue all antibiotics.   Atrial fibrillation - Continue with Eliquis -however briefly held for OR on 3/1-spoke with Dr Sharol Given ok to resume  HTN -Blood pressure elevated, I will add lisinopril.  Diabetes - CBGs stable - Continue with SSI and Lantus - Hold metformin while inpatient  Chronic diastolic failure - Clinically compensated - Continue Lasix at half of home dose  Anemia -suspect 2/2 Chronic disease -Ortho wanting to transfuse 1 unit of PRBC  Disposition: Remain inpatient-SNF on discharge-likely on 3/5  DVT Prophylaxis: Not needed as an Eliquis  Code Status: Full code   Family Communication None at bedside  Procedures:  None  CONSULTS:  ID, orthopedic surgery and Plastics   MEDICATIONS: Scheduled Meds: . digoxin  0.125 mg Oral Daily  . diltiazem  180 mg Oral Daily  . feeding supplement (GLUCERNA SHAKE)  237 mL Oral TID BM  . furosemide  40 mg Oral Daily  . insulin aspart  0-5 Units Subcutaneous QHS  . insulin aspart  0-9 Units Subcutaneous TID WC  . insulin glargine  10 Units Subcutaneous QHS    . pantoprazole  40 mg Oral Daily   Continuous Infusions: . sodium chloride 1,000 mL (01/20/14 1123)   PRN Meds:.HYDROmorphone (DILAUDID) injection, HYDROmorphone (DILAUDID) injection, ibuprofen, methocarbamol (ROBAXIN) IV, methocarbamol, metoCLOPramide (REGLAN) injection, metoCLOPramide, ondansetron (ZOFRAN) IV, ondansetron, oxyCODONE, oxyCODONE-acetaminophen, zolpidem  Antibiotics: Anti-infectives   Start     Dose/Rate Route Frequency Ordered Stop   01/20/14 1400  ceFAZolin (ANCEF) IVPB 1 g/50 mL premix     1 g 100 mL/hr over 30 Minutes Intravenous Every 6 hours 01/20/14 1038 01/21/14 0146   01/17/14 1600  piperacillin-tazobactam (ZOSYN) IVPB 3.375 g  Status:  Discontinued     3.375 g 12.5 mL/hr over 240 Minutes Intravenous Every 8 hours 01/17/14 1335 01/20/14 1703   01/17/14 1500  vancomycin (VANCOCIN) IVPB 750 mg/150 ml premix  Status:  Discontinued     750 mg 150 mL/hr over 60 Minutes Intravenous Every 12 hours 01/17/14 1335 01/19/14 1102   01/17/14 1218  polymyxin B 500,000 Units, bacitracin 50,000 Units in sodium chloride irrigation 0.9 % 500 mL irrigation  Status:  Discontinued       As needed 01/17/14 1218 01/17/14 1237   01/17/14 1100  piperacillin-tazobactam (ZOSYN) IVPB 3.375 g  Status:  Discontinued     3.375 g 100 mL/hr over 30 Minutes Intravenous  Once 01/17/14 1055 01/17/14 1405   01/17/14 0800  vancomycin (VANCOCIN) IVPB 750 mg/150 ml premix  Status:  Discontinued     750 mg 150 mL/hr over 60 Minutes Intravenous Every 12 hours 01/16/14 1657 01/17/14 0820   01/17/14 0000  piperacillin-tazobactam (ZOSYN) IVPB 3.375 g  Status:  Discontinued     3.375 g 12.5 mL/hr over 240 Minutes Intravenous Every 8 hours 01/16/14 1657 01/17/14 0820   01/16/14 1645  piperacillin-tazobactam (ZOSYN) IVPB 3.375 g     3.375 g 100 mL/hr over 30 Minutes Intravenous  Once 01/16/14 1642 01/16/14 1926   01/16/14 1645  vancomycin (VANCOCIN) IVPB 1000 mg/200 mL premix     1,000 mg 200 mL/hr  over 60 Minutes Intravenous  Once 01/16/14 1642 01/16/14 1926       PHYSICAL EXAM: Vital signs in last 24 hours: Filed Vitals:   01/22/14 2219 01/23/14 0049 01/23/14 0600 01/23/14 1026  BP: 183/74 170/81 153/67 173/66  Pulse: 90  80   Temp: 98.5 F (36.9 C)  98.3 F (36.8 C)   TempSrc: Oral  Oral   Resp: 18  18   Height:      Weight:   79.878 kg (176 lb 1.6 oz)   SpO2: 99%  95%     Weight change: 2.313 kg (5 lb 1.6 oz) Filed Weights   01/21/14 0514 01/22/14 0500 01/23/14 0600  Weight: 78.472 kg (173 lb) 77.565 kg (171 lb) 79.878 kg (176 lb 1.6 oz)   Body mass index is 25.27 kg/(m^2).   Gen Exam: Awake and alert with clear speech.   Neck: Supple, No JVD.   Chest: B/L Clear.  No rales or rhonchi CVS: S1 S2 Regular Abdomen: soft, BS +, non tender, non distended.  Extremities: no edema, lower extremities warm to touch. S/P Right BKA-right lef in a bandage -did not open. Neurologic: Non Focal.   Skin: No Rash.   Wounds: [name].   Intake/Output from previous day:  Intake/Output Summary (Last 24 hours) at 01/23/14 1204 Last data filed at 01/23/14 0900  Gross per 24 hour  Intake   1282 ml  Output   2500 ml  Net  -1218 ml     LAB RESULTS: CBC  Recent Labs Lab 01/16/14 1818  01/18/14 0548 01/20/14 0537 01/21/14 0435 01/22/14 0701 01/23/14 0427  WBC 9.0  < > 8.0 5.6 8.6 9.9 10.3  HGB 9.2*  < > 8.4* 8.4* 8.7* 8.1* 8.4*  HCT 27.4*  < > 25.8* 24.8* 26.5* 24.3* 25.7*  PLT 239  < > 244 214 268 275 275  MCV 81.3  < > 81.9 82.4 80.8 80.5 81.1  MCH 27.3  < > 26.7 27.9 26.5 26.8 26.5  MCHC 33.6  < > 32.6 33.9 32.8 33.3 32.7  RDW 13.8  < > 14.1 14.1 14.5 14.5 14.3  LYMPHSABS 1.2  --   --  1.3  --   --   --   MONOABS 0.6  --   --  0.4  --   --   --   EOSABS 0.1  --   --  0.2  --   --   --   BASOSABS 0.0  --   --  0.0  --   --   --   < > = values in this interval not displayed.  Chemistries   Recent Labs Lab 01/16/14 1623 01/17/14 0253 01/18/14 0548  01/20/14 0537 01/22/14 0701  NA 131* 132* 132* 136* 137  K 4.7 4.2 4.2 4.2 4.3  CL 93* 97 96 99 99  CO2 26 23 25 26 27   GLUCOSE 191* 142* 192* 162* 148*  BUN 17 16 17 22 18   CREATININE 1.19 1.12 1.27 1.14 1.09  CALCIUM 9.4 8.6 8.3* 8.5 8.6    CBG:  Recent Labs Lab 01/22/14 0743 01/22/14 1156 01/22/14 1648 01/22/14 2218 01/23/14 0802  GLUCAP 141* 234* 219* 219* 149*    GFR Estimated Creatinine Clearance: 59.5 ml/min (by C-G formula based on Cr of 1.09).  Coagulation profile No results found for this basename: INR, PROTIME,  in the last 168 hours  Cardiac Enzymes No results found for this basename: CK, CKMB, TROPONINI, MYOGLOBIN,  in the last 168 hours  No components found with this basename: POCBNP,  No results found for this basename: DDIMER,  in the last 72 hours No results found for this basename: HGBA1C,  in the last 72 hours No results found for this basename: CHOL, HDL, LDLCALC, TRIG, CHOLHDL, LDLDIRECT,  in the last 72 hours No results found for this basename: TSH, T4TOTAL, FREET3, T3FREE, THYROIDAB,  in the last 72 hours No results found for this basename: VITAMINB12, FOLATE, FERRITIN, TIBC, IRON, RETICCTPCT,  in the last 72 hours No results found for this basename: LIPASE, AMYLASE,  in the last 72 hours  Urine Studies No results found for this basename: UACOL, UAPR, USPG, UPH, UTP, UGL, UKET, UBIL, UHGB, UNIT, UROB, ULEU, UEPI, UWBC, URBC, UBAC, CAST, CRYS, UCOM, BILUA,  in the last 72 hours  MICROBIOLOGY: Recent Results (from the past 240 hour(s))  CULTURE, BLOOD (ROUTINE X 2)     Status: None   Collection Time    01/16/14  4:44 PM      Result Value Ref Range Status   Specimen Description BLOOD ARM LEFT   Final   Special Requests BOTTLES DRAWN AEROBIC AND ANAEROBIC 5CC   Final   Culture  Setup Time     Final   Value: 01/16/2014 22:48     Performed at [facility]   Culture     Final   Value: NO GROWTH 5 DAYS     Performed at [facility]   Report Status 01/22/2014 FINAL   Final  CULTURE, BLOOD (ROUTINE X 2)     Status: None   Collection Time    01/16/14  4:45 PM      Result Value Ref Range Status   Specimen Description BLOOD ARM RIGHT   Final   Special Requests BOTTLES DRAWN AEROBIC AND ANAEROBIC 10CC   Final   Culture  Setup Time     Final   Value: 01/16/2014 22:48     Performed at [facility]   Culture     Final   Value: NO GROWTH 5 DAYS     Performed at [facility]   Report Status 01/22/2014 FINAL   Final  URINE CULTURE     Status: None   Collection Time    01/16/14  8:42 PM      Result Value Ref Range Status   Specimen Description URINE, RANDOM   Final   Special Requests NONE   Final   Culture  Setup Time     Final   Value: 01/17/2014 01:19     Performed at [place]     Final   Value: 65,000 COLONIES/ML     Performed at [facility]   Culture     Final   Value: Multiple bacterial morphotypes present, none predominant. Suggest appropriate recollection if clinically indicated.     Performed at [facility]   Report Status 01/17/2014 FINAL   Final  MRSA PCR SCREENING     Status: None   Collection Time  01/16/14 10:26 PM      Result Value Ref Range Status   MRSA by PCR NEGATIVE  NEGATIVE Final   Comment:            The GeneXpert MRSA Assay (FDA     approved for NASAL specimens     only), is one component of a     comprehensive MRSA colonization     surveillance program. It is not     intended to diagnose MRSA     infection nor to guide or     monitor treatment for     MRSA infections.  SURGICAL PCR SCREEN     Status: None   Collection Time    01/17/14  9:40 AM      Result Value Ref Range Status   MRSA, PCR NEGATIVE  NEGATIVE Final   Staphylococcus aureus NEGATIVE  NEGATIVE Final   Comment:            The Xpert SA Assay (FDA     approved for NASAL specimens     in patients over 89 years of age),     is one component of      a comprehensive surveillance     program.  Test performance has     been validated by [employer] for patients greater     than or equal to 60 year old.     It is not intended     to diagnose infection nor to     guide or monitor treatment.  ANAEROBIC CULTURE     Status: None   Collection Time    01/17/14 12:09 PM      Result Value Ref Range Status   Specimen Description TISSUE BONE RIGHT   Final   Special Requests RIGHT CALCANEOUS BONE PT ON ZOSYN   Final   Gram Stain PENDING   Incomplete   Culture     Final   Value: NO ANAEROBES ISOLATED; CULTURE IN PROGRESS FOR 5 DAYS     Performed at [facility]   Report Status PENDING   Incomplete  TISSUE CULTURE     Status: None   Collection Time    01/17/14 12:09 PM      Result Value Ref Range Status   Specimen Description TISSUE BONE RIGHT   Final   Special Requests RIGHT CALCANEOUS BONE PT ON ZOSYN   Final   Gram Stain     Final   Value: RARE WBC PRESENT,BOTH PMN AND MONONUCLEAR     FEW GRAM POSITIVE COCCI IN PAIRS     Performed at [facility]   Culture     Final   Value: MODERATE GROUP B STREP(S.AGALACTIAE)ISOLATED     Note: TESTING AGAINST S. AGALACTIAE NOT ROUTINELY PERFORMED DUE TO PREDICTABILITY OF AMP/PEN/VAN SUSCEPTIBILITY.     Performed at [facility]   Report Status 01/19/2014 FINAL   Final    RADIOLOGY STUDIES/RESULTS: Dg Os Calcis Right  01/16/2014   CLINICAL DATA:  Large open wound over the calcaneus.  EXAM: RIGHT OS CALCIS - 2+ VIEW  COMPARISON:  None.  FINDINGS: There is a large soft tissue wound along the lateral margin calcaneus. Underlying bony destructive change in the posterior and lateral calcaneus is consistent with osteomyelitis. No radiopaque foreign body is identified. Vascular calcifications are noted.  IMPRESSION: Large skin ulceration over the lateral calcaneus with underlying osteomyelitis.   Electronically Signed   By: Inge Rise M.D.  On: 01/16/2014 17:42   Ct Foot  Right W Contrast  12/25/2013   CLINICAL DATA:  Osteomyelitis.  EXAM: CT OF THE RIGHT FOOT WITH CONTRAST  TECHNIQUE: Multidetector CT imaging was performed following the standard protocol during bolus administration of intravenous contrast.  CONTRAST:  176mL OMNIPAQUE IOHEXOL 300 MG/ML  SOLN  COMPARISON:  DG OS CALCIS*R* dated 12/12/2013  FINDINGS: There is an ulcer along the lateral aspect of the calcaneus, extending to the cortical surface. There is rare fraction of bone and cortical osteolysis compatible with active osteomyelitis in a lateral calcaneus deep to the ulcer. Gas extends through the ulcer tract to the bone surface. There midfoot erosions in the medial cuneiform bone which may be associated with the patient's history of gout arthritis or ordinary degenerative disease. First MTP joint osteoarthritis is mild. Old avulsion fragments are present adjacent to the medial malleolus in the deltoid ligament. Calcification of the posterior talofibular ligament is incidentally noted. No soft tissue abscess is identified. Grossly, the Achilles tendon and plantar fascia appear within normal limits.  IMPRESSION: Lateral hindfoot ulceration with osteomyelitis of the lateral calcaneal tuberosity (image 28 series 3). No soft tissue abscess.   Electronically Signed   By: Dereck Ligas M.D.   On: 12/25/2013 15:46    Shalise Rosado A, MD  [facility] Pager:[phone]  If 7PM-7AM, please contact night-coverage [email or web address] Password [email or web address] 01/23/2014, 12:04 PM   LOS: 7 days

## 2014-01-23 NOTE — Clinical Social Work Placement (Signed)
Clinical Social Work Department CLINICAL SOCIAL WORK PLACEMENT NOTE 01/23/2014  Patient:  [name], [name]  Account Number:  [id number] [place] date:  01/16/2014  Clinical Social Worker:  Berton Mount, Latanya Presser  Date/time:  01/19/2014 04:00 PM  Clinical Social Work is seeking post-discharge placement for this patient at the following level of care:   [place]   (*CSW will update this form in Epic as items are completed)   01/19/2014  Patient/family provided with [place] Department of Clinical Social Work's list of facilities offering this level of care within the geographic area requested by the patient (or if unable, by the patient's family).  01/19/2014  Patient/family informed of their freedom to choose among providers that offer the needed level of care, that participate in Medicare, Medicaid or managed care program needed by the patient, have an available bed and are willing to accept the patient.  01/19/2014  Patient/family informed of [facility]' ownership interest in [facility], as well as of the fact that they are under no obligation to receive care at this facility.  PASARR submitted to EDS on  PASARR number received from [place] on   FL2 transmitted to all facilities in geographic area requested by pt/family on  01/19/2014 FL2 transmitted to all facilities within larger geographic area on   Patient informed that his/her managed care company has contracts with or will negotiate with  certain facilities, including the following:     Patient/family informed of bed offers received:  01/20/2014 Patient chooses bed at [facility], [place] Physician recommends and patient chooses bed at    Patient to be transferred to [place] on   Patient to be transferred to facility by   The following physician request were entered in Epic:   Additional Comments:    Liz Beach, Latanya Presser, [place], [phone]

## 2014-01-23 NOTE — Clinical Social Work Note (Signed)
CSW met with patient at bedside to check in. Patient seems anxious about discharging from the hospital. He states that his blood pressure was elevated last night and the past two days have been difficult for him. CSW asked patient if there is any particular reason why he is worried about discharging from the hospital. Patient states, "I just don't want to leave until I know you all have done a good job." Patient brought up the bleeding he experienced a couple of days ago from his amputation site. CSW assured patient that his treatment team will not discharge him unless he is medically stable for discharge and upon discharge he will have a bed at [facility] (his preferred facility). CSW has shared patient's concerns with MD.  Liz Beach, [place], [place], [phone]

## 2014-01-24 LAB — TYPE AND SCREEN
ABO/RH(D): B POS
ANTIBODY SCREEN: POSITIVE
DAT, IGG: NEGATIVE
UNIT DIVISION: 0
Unit division: 0

## 2014-01-24 LAB — GLUCOSE, CAPILLARY
GLUCOSE-CAPILLARY: 183 mg/dL — AB (ref 70–99)
Glucose-Capillary: 150 mg/dL — ABNORMAL HIGH (ref 70–99)
Glucose-Capillary: 158 mg/dL — ABNORMAL HIGH (ref 70–99)

## 2014-01-24 LAB — ANAEROBIC CULTURE

## 2014-01-24 MED ORDER — LISINOPRIL 10 MG PO TABS: 5.0000 mg | ORAL_TABLET | Freq: Every day | ORAL | Status: DC

## 2014-01-24 MED ORDER — OXYCODONE HCL 10 MG PO TABS: 10.0000 mg | ORAL_TABLET | Freq: Four times a day (QID) | ORAL | Status: DC | PRN

## 2014-01-24 MED ORDER — FUROSEMIDE 40 MG PO TABS: 40.0000 mg | ORAL_TABLET | Freq: Every day | ORAL | Status: DC

## 2014-01-24 NOTE — Clinical Social Work Placement (Signed)
Clinical Social Work Department CLINICAL SOCIAL WORK PLACEMENT NOTE 01/24/2014  Patient:  HYDER, DEMAN  Account Number:  [id number] Admit date:  01/16/2014  Clinical Social Worker:  Berton Mount, Latanya Presser  Date/time:  01/19/2014 04:00 PM  Clinical Social Work is seeking post-discharge placement for this patient at the following level of care:   [place]   (*CSW will update this form in Epic as items are completed)   01/19/2014  Patient/family provided with [place] Department of Clinical Social Work's list of facilities offering this level of care within the geographic area requested by the patient (or if unable, by the patient's family).  01/19/2014  Patient/family informed of their freedom to choose among providers that offer the needed level of care, that participate in Medicare, Medicaid or managed care program needed by the patient, have an available bed and are willing to accept the patient.  01/19/2014  Patient/family informed of [facility]' ownership interest in [facility], as well as of the fact that they are under no obligation to receive care at this facility.  PASARR submitted to EDS on  PASARR number received from [place] on   FL2 transmitted to all facilities in geographic area requested by pt/family on  01/19/2014 FL2 transmitted to all facilities within larger geographic area on   Patient informed that his/her managed care company has contracts with or will negotiate with  certain facilities, including the following:     Patient/family informed of bed offers received:  01/20/2014 Patient chooses bed at [facility], [place] Physician recommends and patient chooses bed at    Patient to be transferred to [place] on  01/24/2014 Patient to be transferred to facility by Ambulance  The following physician request were entered in Epic:   Additional Comments: Per MD patient ready for Dc to [facility]. RN,  patient, wife, and facility notified of DC. Wife has completed paperwork at facility. RN given number for report. DC packet on chart. Ambulance transport requested for patient for 2:30PM. CSW signing off at this time.   Liz Beach, [place], [place], [phone]

## 2014-01-24 NOTE — Progress Notes (Signed)
Inpatient Diabetes Program Recommendations  AACE/ADA: New Consensus Statement on Inpatient Glycemic Control (2013)  Target Ranges:  Prepandial:   less than 140 mg/dL      Peak postprandial:   less than 180 mg/dL (1-2 hours)      Critically ill patients:  140 - 180 mg/dL     Results for [name], Cross (MRN [id number]) as of 01/24/2014 08:44  Ref. Range 01/23/2014 08:02 01/23/2014 12:12 01/23/2014 16:34 01/23/2014 21:14  Glucose-Capillary Latest Range: 70-99 mg/dL 149 (H) 223 (H) 250 (H) 194 (H)    **Patient eating 75-100% of meals.  Having some issues with postprandial glucose elevations.   **MD- Please consider adding Novolog meal coverage- Novolog 3 units tid with meals   Will follow. Wyn Quaker RN, MSN, CDE Diabetes Coordinator Inpatient Diabetes Program Team Pager: [phone] (8a-10p)

## 2014-01-24 NOTE — Progress Notes (Signed)
[name] to be D/C'd Skilled nursing facility per MD order.  Discussed with the patient and all questions fully answered.    Medication List    STOP taking these medications       ciprofloxacin 750 MG tablet  Commonly known as:  CIPRO      TAKE these medications       apixaban 5 MG Tabs tablet  Commonly known as:  ELIQUIS  Take 1 tablet (5 mg total) by mouth 2 (two) times daily.     digoxin 0.125 MG tablet  Commonly known as:  LANOXIN  Take 0.125 mg by mouth daily.     diltiazem 180 MG 24 hr capsule  Commonly known as:  CARDIZEM CD  Take 1 capsule (180 mg total) by mouth daily.     furosemide 40 MG tablet  Commonly known as:  LASIX  Take 1 tablet (40 mg total) by mouth daily.     insulin aspart 100 UNIT/ML injection  Commonly known as:  novoLOG  Sliding scale before meals-- if cbg is > 300 take 10 units:if 200-300 use 5 units- if < than 200 no insulin     insulin glargine 100 UNIT/ML injection  Commonly known as:  LANTUS  Inject 25 Units into the skin at bedtime.     lisinopril 10 MG tablet  Commonly known as:  PRINIVIL,ZESTRIL  Take 0.5 tablets (5 mg total) by mouth daily.     metFORMIN 500 MG tablet  Commonly known as:  GLUCOPHAGE  Take 500 mg by mouth 2 (two) times daily with a meal.     omeprazole 20 MG capsule  Commonly known as:  PRILOSEC  Take 20 mg by mouth 2 (two) times daily before a meal.     Oxycodone HCl 10 MG Tabs  Take 1 tablet (10 mg total) by mouth every 6 (six) hours as needed.     Zinc 50 MG Tabs  Take 1 tablet by mouth 2 (two) times daily.        VVS, Skin clean, dry and intact without evidence of skin break down, no evidence of skin tears noted. Right BKA dressing dry and intact.  IV catheter discontinued intact. Site without signs and symptoms of complications. Dressing and pressure applied.  Patient escorted via EMS to [facility]    [name] 01/24/2014 2:19 PM

## 2014-01-24 NOTE — Discharge Summary (Signed)
Physician Discharge Summary  [name] YWV:[id number] DOB: 1935-05-10 DOA: 01/16/2014  PCP: Georgetta Haber, MD  Admit date: 01/16/2014 Discharge date: 01/24/2014  Time spent: 40 minutes  Recommendations for Outpatient Follow-up:  1. Followup with Dr. Sharol Given in 1 week. 2. Obtain BMP in one week to assess renal function as patient is started on lisinopril and Lasix dose decreased.  Discharge Diagnoses:  Active Problems:   Osteomyelitis   Ulcer of heel   Discharge Condition: Stable  Diet recommendation: Carbohydrate modified diet  Filed Weights   01/22/14 0500 01/23/14 0600 01/24/14 0433  Weight: 77.565 kg (171 lb) 79.878 kg (176 lb 1.6 oz) 79.8 kg (175 lb 14.8 oz)    History of present illness:  79 yo male with diabetes, CHF, atrial fibrillation on chronic anticoagulation, s/p pacemaker placement, who now presented to [facility] ED with main concern of progressively worsening right foot wound (followed by Dr. Tommy Medal - ID, Dr. Dellia Nims - wound care). Recently treated with Ciprofloxacin after found to be growing Pseudomonas in the wound. He explains that right lower extremity has been more painful over the past week, throbbing and constant, 5/10 in severity, non radiating, no specific alleviating factors, worse with movement and palpation. Pt denies fevers, chills, no specific abdominal or urinary concerns. Pt also denies chest pain or shortness of breath.  In ED, pt is hemodynamically stable. XRAY of the right foot with large skin ulceration over the lateral calcaneus with underlying osteomyelitis. Pt started on Vancomycin and Zosyn as per ID (Dr. Baxter Flattery) recommendations. Ortho team consulted for further recommendations.   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 Postoperatively, patient changed his mind and now was agreeable to undergo a right BKA. Dr. Sharol Given subsequently took the patient for a BKA on 3/2. ID has now recommended we discontinue all antibiotics. -Post surgery patient on the stump which is controlled with dressings and doesn't have to go back to the OR.   Atrial fibrillation  - Patient is on Eliquis, held briefly for the OR. Restarted and after patient had cleaning held again. -Eliquis restarted at the time of discharge. -Patient rate is controlled with digoxin and Cardizem.  Hypertension -Patient blood pressure was in the high side, lisinopril added the 10 mg daily. -Lasix decreased to 40 mg from 80.  Diabetes  - CBGs stable  - Continue with SSI and Lantus  - Hold metformin while inpatient   Chronic diastolic failure  - Clinically compensated  - Continue Lasix at half of home dose   Anemia  -suspect 2/2 Chronic disease  -Ortho wanting to transfuse 1 unit of PRBC   Procedures:  Right transtibial medication  Consultations:  Dr. Sharol Given of orthopedics.  Discharge Exam: Filed Vitals:   01/24/14 1102  BP: 156/66  Pulse: 95  Temp:   Resp:    General: Alert and awake, oriented x3, not in any acute distress. HEENT: anicteric sclera, pupils reactive to light and accommodation, EOMI CVS: S1-S2 clear, no murmur rubs or gallops Chest: clear to auscultation bilaterally, no wheezing, rales or rhonchi Abdomen: soft nontender, nondistended, normal bowel sounds, no organomegaly Extremities: Right BKA Neuro: Cranial nerves II-XII intact,  no focal neurological deficits  Discharge Instructions  Discharge Orders   Future Appointments Provider Department Dept Phone   02/06/2014 2:30 PM Wallene Huh, [place] [facility] [place]   02/06/2014 4:00 PM Truman Hayward, MD [facility] [phone]   02/17/2014 2:30 PM Ricard Dillon, MD [place] [facility] [place]   Future Orders Complete By Expires   Diet Carb Modified  As directed    Increase activity slowly  As directed        Medication List    STOP taking these medications       ciprofloxacin 750 MG tablet  Commonly known as:  CIPRO      TAKE these medications       apixaban 5 MG Tabs tablet  Commonly known as:  ELIQUIS  Take 1 tablet (5 mg total) by mouth 2 (two) times daily.     digoxin 0.125 MG tablet  Commonly known as:  LANOXIN  Take 0.125 mg by mouth daily.     diltiazem 180 MG 24 hr capsule  Commonly known as:  CARDIZEM CD  Take 1 capsule (180 mg total) by mouth daily.     furosemide 40 MG tablet  Commonly known as:  LASIX  Take 1 tablet (40 mg total) by mouth daily.     insulin aspart 100 UNIT/ML injection  Commonly known as:  novoLOG  Sliding scale before meals-- if cbg is > 300 take 10 units:if 200-300 use 5 units- if < than 200 no insulin     insulin glargine 100 UNIT/ML injection  Commonly known as:  LANTUS  Inject 25 Units into the skin at bedtime.     lisinopril 10 MG tablet  Commonly known as:  PRINIVIL,ZESTRIL  Take 0.5 tablets (5 mg total) by mouth daily.     metFORMIN 500 MG tablet  Commonly known as:  GLUCOPHAGE  Take 500 mg by mouth 2 (two) times daily with a meal.     omeprazole 20 MG capsule  Commonly known as:  PRILOSEC  Take 20 mg by mouth 2 (two) times daily before a meal.     Oxycodone HCl 10 MG Tabs  Take 1 tablet (10 mg total) by mouth every 6 (six) hours as needed.     Zinc 50 MG Tabs  Take 1 tablet by mouth 2 (two) times daily.       Allergies  Allergen Reactions  . Morphine Rash and Other (See Comments)    Irritability also       Follow-up Information   Follow up with DUDA,MARCUS V, MD In 1 week.   Specialty:  Orthopedic Surgery   Contact information:   [place] [id number] [phone]        The results of significant diagnostics from this hospitalization (including imaging, microbiology, ancillary and laboratory) are listed below for reference.    Significant Diagnostic Studies: Dg Wrist Complete Right  01/18/2014   CLINICAL DATA:  [place] several days ago. Right wrist pain and swelling.  EXAM: RIGHT WRIST - COMPLETE 3+ VIEW  COMPARISON:  None.  FINDINGS: No acute fracture. There is an old ulnar styloid fracture. Soft tissue calcifications are seen surrounding the wrist joints. This suggest chondrocalcinosis. There is joint space narrowing and subchondral sclerosis with adjacent soft tissue calcification at the first carpal metacarpal articulation. The bones are diffusely demineralized.  There is mild soft tissue swelling predominates along the ulnar  aspect of the wrist.  IMPRESSION: No acute fracture or dislocation.   Electronically Signed   By: Lajean Manes M.D.   On: 01/18/2014 19:59   Dg Os Calcis Right  01/16/2014   CLINICAL DATA:  Large open wound over the calcaneus.  EXAM: RIGHT OS CALCIS - 2+ VIEW  COMPARISON:  None.  FINDINGS: There is a large soft tissue wound along the lateral margin calcaneus. Underlying bony destructive change in the posterior and lateral calcaneus is consistent with osteomyelitis. No radiopaque foreign body is identified. Vascular calcifications are noted.  IMPRESSION: Large skin ulceration over the lateral calcaneus with underlying osteomyelitis.   Electronically Signed   By: Inge Rise M.D.   On: 01/16/2014 17:42   Ct Foot Right W Contrast  12/25/2013   CLINICAL DATA:  Osteomyelitis.  EXAM: CT OF THE RIGHT FOOT WITH CONTRAST  TECHNIQUE: Multidetector CT imaging was performed following the standard protocol during bolus administration of intravenous contrast.  CONTRAST:  127mL OMNIPAQUE IOHEXOL 300 MG/ML  SOLN  COMPARISON:  DG OS CALCIS*R* dated 12/12/2013  FINDINGS: There is an ulcer along the  lateral aspect of the calcaneus, extending to the cortical surface. There is rare fraction of bone and cortical osteolysis compatible with active osteomyelitis in a lateral calcaneus deep to the ulcer. Gas extends through the ulcer tract to the bone surface. There midfoot erosions in the medial cuneiform bone which may be associated with the patient's history of gout arthritis or ordinary degenerative disease. First MTP joint osteoarthritis is mild. Old avulsion fragments are present adjacent to the medial malleolus in the deltoid ligament. Calcification of the posterior talofibular ligament is incidentally noted. No soft tissue abscess is identified. Grossly, the Achilles tendon and plantar fascia appear within normal limits.  IMPRESSION: Lateral hindfoot ulceration with osteomyelitis of the lateral calcaneal tuberosity (image 28 series 3). No soft tissue abscess.   Electronically Signed   By: Dereck Ligas M.D.   On: 12/25/2013 15:46    Microbiology: Recent Results (from the past 240 hour(s))  CULTURE, BLOOD (ROUTINE X 2)     Status: None   Collection Time    01/16/14  4:44 PM      Result Value Ref Range Status   Specimen Description BLOOD ARM LEFT   Final   Special Requests BOTTLES DRAWN AEROBIC AND ANAEROBIC 5CC   Final   Culture  Setup Time     Final   Value: 01/16/2014 22:48     Performed at [facility]   Culture     Final   Value: NO GROWTH 5 DAYS     Performed at [facility]   Report Status 01/22/2014 FINAL   Final  CULTURE, BLOOD (ROUTINE X 2)     Status: None   Collection Time    01/16/14  4:45 PM      Result Value Ref Range Status   Specimen Description BLOOD ARM RIGHT   Final   Special Requests BOTTLES DRAWN AEROBIC AND ANAEROBIC 10CC   Final   Culture  Setup Time     Final   Value: 01/16/2014 22:48     Performed at [facility]   Culture     Final   Value: NO GROWTH 5 DAYS     Performed at [facility]   Report Status 01/22/2014 FINAL    Final  URINE CULTURE     Status: None   Collection Time    01/16/14  8:42 PM  Result Value Ref Range Status   Specimen Description URINE, RANDOM   Final   Special Requests NONE   Final   Culture  Setup Time     Final   Value: 01/17/2014 01:19     Performed at [place]     Final   Value: 65,000 COLONIES/ML     Performed at [facility]   Culture     Final   Value: Multiple bacterial morphotypes present, none predominant. Suggest appropriate recollection if clinically indicated.     Performed at [facility]   Report Status 01/17/2014 FINAL   Final  MRSA PCR SCREENING     Status: None   Collection Time    01/16/14 10:26 PM      Result Value Ref Range Status   MRSA by PCR NEGATIVE  NEGATIVE Final   Comment:            The GeneXpert MRSA Assay (FDA     approved for NASAL specimens     only), is one component of a     comprehensive MRSA colonization     surveillance program. It is not     intended to diagnose MRSA     infection nor to guide or     monitor treatment for     MRSA infections.  SURGICAL PCR SCREEN     Status: None   Collection Time    01/17/14  9:40 AM      Result Value Ref Range Status   MRSA, PCR NEGATIVE  NEGATIVE Final   Staphylococcus aureus NEGATIVE  NEGATIVE Final   Comment:            The Xpert SA Assay (FDA     approved for NASAL specimens     in patients over 7 years of age),     is one component of     a comprehensive surveillance     program.  Test performance has     been validated by [employer] for patients greater     than or equal to 67 year old.     It is not intended     to diagnose infection nor to     guide or monitor treatment.  ANAEROBIC CULTURE     Status: None   Collection Time    01/17/14 12:09 PM      Result Value Ref Range Status   Specimen Description TISSUE BONE RIGHT   Final   Special Requests RIGHT CALCANEOUS BONE PT ON ZOSYN   Final   Gram Stain PENDING   Incomplete    Culture     Final   Value: NO ANAEROBES ISOLATED; CULTURE IN PROGRESS FOR 5 DAYS     Performed at [facility]   Report Status PENDING   Incomplete  TISSUE CULTURE     Status: None   Collection Time    01/17/14 12:09 PM      Result Value Ref Range Status   Specimen Description TISSUE BONE RIGHT   Final   Special Requests RIGHT CALCANEOUS BONE PT ON ZOSYN   Final   Gram Stain     Final   Value: RARE WBC PRESENT,BOTH PMN AND MONONUCLEAR     FEW GRAM POSITIVE COCCI IN PAIRS     Performed at [facility]   Culture     Final   Value: MODERATE GROUP B STREP(S.AGALACTIAE)ISOLATED  Note: TESTING AGAINST S. AGALACTIAE NOT ROUTINELY PERFORMED DUE TO PREDICTABILITY OF AMP/PEN/VAN SUSCEPTIBILITY.     Performed at [facility]   Report Status 01/19/2014 FINAL   Final     Labs: Basic Metabolic Panel:  Recent Labs Lab 01/18/14 0548 01/20/14 0537 01/22/14 0701  NA 132* 136* 137  K 4.2 4.2 4.3  CL 96 99 99  CO2 25 26 27   GLUCOSE 192* 162* 148*  BUN 17 22 18   CREATININE 1.27 1.14 1.09  CALCIUM 8.3* 8.5 8.6   Liver Function Tests:  Recent Labs Lab 01/20/14 0537  AST 25  ALT 17  ALKPHOS 137*  BILITOT 0.4  PROT 6.5  ALBUMIN 2.1*   No results found for this basename: LIPASE, AMYLASE,  in the last 168 hours No results found for this basename: AMMONIA,  in the last 168 hours CBC:  Recent Labs Lab 01/18/14 0548 01/20/14 0537 01/21/14 0435 01/22/14 0701 01/23/14 0427  WBC 8.0 5.6 8.6 9.9 10.3  NEUTROABS  --  3.7  --   --   --   HGB 8.4* 8.4* 8.7* 8.1* 8.4*  HCT 25.8* 24.8* 26.5* 24.3* 25.7*  MCV 81.9 82.4 80.8 80.5 81.1  PLT 244 214 268 275 275   Cardiac Enzymes: No results found for this basename: CKTOTAL, CKMB, CKMBINDEX, TROPONINI,  in the last 168 hours BNP: BNP (last 3 results)  Recent Labs  03/01/13 1543 01/16/14 2043  PROBNP 100.0 3148.0*   CBG:  Recent Labs Lab 01/23/14 1212 01/23/14 1634 01/23/14 2114 01/24/14 0605  01/24/14 0833  GLUCAP 223* 250* 194* 150* 158*       Signed:  Winter Jocelyn A  [facility] 01/24/2014, 11:57 AM

## 2014-01-24 NOTE — Progress Notes (Signed)
Report given to [facility], nurse at [facility].

## 2014-01-27 ENCOUNTER — Other Ambulatory Visit: Payer: Self-pay | Admitting: *Deleted

## 2014-01-27 MED ORDER — OXYCODONE HCL 10 MG PO TABS: ORAL_TABLET | ORAL | Status: DC

## 2014-01-27 NOTE — Telephone Encounter (Signed)
[facility] [place] 

## 2014-01-28 ENCOUNTER — Non-Acute Institutional Stay (SKILLED_NURSING_FACILITY): Payer: Medicare Other | Admitting: Internal Medicine

## 2014-01-28 ENCOUNTER — Encounter: Payer: Self-pay | Admitting: Internal Medicine

## 2014-01-28 DIAGNOSIS — I4891 Unspecified atrial fibrillation: Secondary | ICD-10-CM

## 2014-01-28 DIAGNOSIS — I872 Venous insufficiency (chronic) (peripheral): Secondary | ICD-10-CM

## 2014-01-28 DIAGNOSIS — Z89511 Acquired absence of right leg below knee: Secondary | ICD-10-CM

## 2014-01-28 DIAGNOSIS — M869 Osteomyelitis, unspecified: Secondary | ICD-10-CM

## 2014-01-28 DIAGNOSIS — E785 Hyperlipidemia, unspecified: Secondary | ICD-10-CM

## 2014-01-28 DIAGNOSIS — I482 Chronic atrial fibrillation, unspecified: Secondary | ICD-10-CM

## 2014-01-28 DIAGNOSIS — K2100 Gastro-esophageal reflux disease with esophagitis, without bleeding: Secondary | ICD-10-CM

## 2014-01-28 DIAGNOSIS — M109 Gout, unspecified: Secondary | ICD-10-CM

## 2014-01-28 DIAGNOSIS — I509 Heart failure, unspecified: Secondary | ICD-10-CM

## 2014-01-28 DIAGNOSIS — I10 Essential (primary) hypertension: Secondary | ICD-10-CM

## 2014-01-28 DIAGNOSIS — E1159 Type 2 diabetes mellitus with other circulatory complications: Secondary | ICD-10-CM

## 2014-01-28 DIAGNOSIS — K222 Esophageal obstruction: Secondary | ICD-10-CM

## 2014-01-28 DIAGNOSIS — S88119A Complete traumatic amputation at level between knee and ankle, unspecified lower leg, initial encounter: Secondary | ICD-10-CM

## 2014-01-28 DIAGNOSIS — I5032 Chronic diastolic (congestive) heart failure: Secondary | ICD-10-CM

## 2014-01-28 NOTE — Progress Notes (Signed)
Patient ID: [name], male   DOB: [birth year], 78 y.o.   MRN: [id number]  Provider:  Rexene Edison. Mariea Clonts, D.O., C.M.D. Location: [facility]  PCP: Georgetta Haber, MD  Code Status: DNR  Allergies  Allergen Reactions  . Morphine Rash and Other (See Comments)    Irritability also    Chief Complaint  Patient presents with  . Hospitalization Follow-up    new admission s/p hospitalization for right BKA by Dr. Sharol Given    HPI: 79 y.o. male with a complex medical history including DMII with circulatory complications, PVD including thrombophlebitis, esophageal stricture with dysphagia and weight loss, diastolic CHF, afib on eliquis, tachy/brady s/p pacer, htn, GERD admitted here for short term rehab s/p hospitalization for right BKA.  He has been dealing with a right calcaneal ulcer with associated osteomyelitis.  He's undergone debridement, several topical agents have been used, he's received hyperbaric oxygen therapy through the wound care center, but his ulcer did not heal.  His debridement and biopsy of the ulcer revealed strep agalactiae and pseudomonas so he was initially treated with cipro, vanc and zosyn.  He wound up requiring a right BKA 1 week ago due to persistent osteomyelitis.  During his stay, he also had right third digit gout.  This is now improving.    He's had some difficulty sleeping since arrival and requests to continue the melatonin which helped last night.  He also does not want desserts on his tray to try to keep with his diabetic diet.  He has some neuropathic pain in his medial thigh that comes in spurts, but seems to be controlled.    ROS: Review of Systems  Constitutional: Negative for fever.  HENT: Negative for congestion.   Eyes: Negative for blurred vision.  Respiratory: Negative for shortness of breath.   Cardiovascular: Negative for chest pain.  Gastrointestinal: Negative for constipation.  Genitourinary: Negative for dysuria.    Musculoskeletal: Negative for myalgias.  Skin: Negative for rash.  Neurological: Positive for tingling and sensory change.  Psychiatric/Behavioral: Negative for depression and memory loss. The patient has insomnia.      Past Medical History  Diagnosis Date  . Fatigue   . Overweight   . Syncope and collapse   . GERD (gastroesophageal reflux disease)   . Right bundle branch block   . Unspecified venous (peripheral) insufficiency   . Phlebitis and thrombophlebitis of superficial vessels of lower extremities   . Cellulitis and abscess of leg, except foot   . Backache, unspecified   . Long term (current) use of anticoagulants   . Type II or unspecified type diabetes mellitus without mention of complication, not stated as uncontrolled   . Unspecified venous (peripheral) insufficiency   . Hypertension   . Gout, unspecified   . Heart failure   . Atrial fibrillation     permanent  . Esophageal stricture   . Hx of colonic polyps 05-2006    (Adenomatous)Dr. [name]  . Diverticulosis 05-2006    Dr. [name]   . Tachycardia-bradycardia     s/p PPM  . Pacemaker- [name] 02/20/2013   Past Surgical History  Procedure Laterality Date  . Laminectomy    . Lumbar fusion    . Rotator cuff repair    . Ruptured rt rectus muscle    . Eye surgery    . Pacemaker insertion  8/12    SJM by [name] for tachy/brady syndrome  . I&d extremity Right 01/17/2014    Procedure: IRRIGATION  AND DEBRIDEMENT RIGHT HEEL  WITH CULTURES AND BONE BIOPSY, placement of wound vac;  Surgeon: Theodoro Kos, DO;  Location: [facility];  Service: Plastics;  Laterality: Right;  . Amputation Right 01/20/2014    Procedure: AMPUTATION BELOW KNEE- right;  Surgeon: [name], MD;  Location: [place];  Service: Orthopedics;  Laterality: Right;  Right Below Knee Amputation   Social History:   reports that he has never smoked. He has never used smokeless tobacco. He reports that he drinks alcohol. He reports that he does not use  illicit drugs.  Family History  Problem Relation Age of Onset  . Pneumonia Mother   . Heart disease Father   . Diabetes Father   . Colon cancer Neg Hx     Medications: Patient's Medications  New Prescriptions   No medications on file  Previous Medications   APIXABAN (ELIQUIS) 5 MG TABS TABLET    Take 1 tablet (5 mg total) by mouth 2 (two) times daily.   DIGOXIN (LANOXIN) 0.125 MG TABLET    Take 0.125 mg by mouth daily.   DILTIAZEM (CARDIZEM CD) 180 MG 24 HR CAPSULE    Take 1 capsule (180 mg total) by mouth daily.   FUROSEMIDE (LASIX) 40 MG TABLET    Take 1 tablet (40 mg total) by mouth daily.   INSULIN ASPART (NOVOLOG) 100 UNIT/ML INJECTION    Sliding scale before meals-- if cbg is > 300 take 10 units:if 200-300 use 5 units- if < than 200 no insulin   INSULIN GLARGINE (LANTUS) 100 UNIT/ML INJECTION    Inject 25 Units into the skin at bedtime.    LISINOPRIL (PRINIVIL,ZESTRIL) 10 MG TABLET    Take 0.5 tablets (5 mg total) by mouth daily.   METFORMIN (GLUCOPHAGE) 500 MG TABLET    Take 500 mg by mouth 2 (two) times daily with a meal.   OMEPRAZOLE (PRILOSEC) 20 MG CAPSULE    Take 20 mg by mouth 2 (two) times daily before a meal.   OXYCODONE HCL 10 MG TABS    Take one tablet by mouth every 6 hours as needed for pain   ZINC 50 MG TABS    Take 1 tablet by mouth 2 (two) times daily.  Modified Medications   No medications on file  Discontinued Medications   No medications on file     Physical Exam: Filed Vitals:   01/28/14 1419  BP: 134/66  Pulse: 77  Temp: 97.3 F (36.3 C)  Resp: 18  Height: 5\' 10"  (1.778 m)  Weight: 161 lb (73.029 kg)  Physical Exam  Constitutional: He is oriented to person, place, and time. He appears well-developed and well-nourished. No distress.  HENT:  Head: Normocephalic and atraumatic.  Right Ear: External ear normal.  Left Ear: External ear normal.  Nose: Nose normal.  Mouth/Throat: Oropharynx is clear and moist.  Eyes: Conjunctivae and EOM are  normal. Pupils are equal, round, and reactive to light.  Neck: Normal range of motion. Neck supple. No JVD present.  Cardiovascular: Normal rate, regular rhythm and normal heart sounds.   Pulmonary/Chest: Effort normal and breath sounds normal. No respiratory distress.  Abdominal: Soft. Bowel sounds are normal. He exhibits no distension and no mass. There is no tenderness.  Musculoskeletal: Normal range of motion.  Right BKA with dressings in place  Lymphadenopathy:    He has no cervical adenopathy.  Neurological: He is alert and oriented to person, place, and time.  Skin: Skin is warm.  Drainage present  through dressing   Psychiatric: He has a normal mood and affect.     Labs reviewed: Basic Metabolic Panel:  Recent Labs  01/18/14 0548 01/20/14 0537 01/22/14 0701  NA 132* 136* 137  K 4.2 4.2 4.3  CL 96 99 99  CO2 25 26 27   GLUCOSE 192* 162* 148*  BUN 17 22 18   CREATININE 1.27 1.14 1.09  CALCIUM 8.3* 8.5 8.6   Liver Function Tests:  Recent Labs  03/01/13 1543 09/30/13 1503 01/20/14 0537  AST 13 20 25   ALT 10 12 17   ALKPHOS 69 79 137*  BILITOT 0.6 0.8 0.4  PROT 6.6 7.1 6.5  ALBUMIN 3.5 3.5 2.1*  CBC:  Recent Labs  06/17/13 1454  01/16/14 1818  01/20/14 0537 01/21/14 0435 01/22/14 0701 01/23/14 0427  WBC 8.8  < > 9.0  < > 5.6 8.6 9.9 10.3  NEUTROABS 6.8  --  7.1  --  3.7  --   --   --   HGB 11.1*  < > 9.2*  < > 8.4* 8.7* 8.1* 8.4*  HCT 33.1*  < > 27.4*  < > 24.8* 26.5* 24.3* 25.7*  MCV 87.6  < > 81.3  < > 82.4 80.8 80.5 81.1  PLT 192.0  < > 239  < > 214 268 275 275  < > = values in this interval not displayed.  CBG:  Recent Labs  01/24/14 0605 01/24/14 0833 01/24/14 1156  GLUCAP 150* 158* 183*   Imaging and Procedures: Dg Wrist Complete Right  01/18/2014 CLINICAL DATA: [place] several days ago. Right wrist pain and swelling. EXAM: RIGHT WRIST - COMPLETE 3+ VIEW COMPARISON: None. FINDINGS: No acute fracture. There is an old ulnar styloid fracture.  Soft tissue calcifications are seen surrounding the wrist joints. This suggest chondrocalcinosis. There is joint space narrowing and subchondral sclerosis with adjacent soft tissue calcification at the first carpal metacarpal articulation. The bones are diffusely demineralized. There is mild soft tissue swelling predominates along the ulnar aspect of the wrist. IMPRESSION: No acute fracture or dislocation. Electronically Signed By: Lajean Manes M.D. On: 01/18/2014 19:59   Dg Os Calcis Right  01/16/2014 CLINICAL DATA: Large open wound over the calcaneus. EXAM: RIGHT OS CALCIS - 2+ VIEW COMPARISON: None. FINDINGS: There is a large soft tissue wound along the lateral margin calcaneus. Underlying bony destructive change in the posterior and lateral calcaneus is consistent with osteomyelitis. No radiopaque foreign body is identified. Vascular calcifications are noted. IMPRESSION: Large skin ulceration over the lateral calcaneus with underlying osteomyelitis. Electronically Signed By: Inge Rise M.D. On: 01/16/2014 17:42   Ct Foot Right W Contrast  12/25/2013 CLINICAL DATA: Osteomyelitis. EXAM: CT OF THE RIGHT FOOT WITH CONTRAST TECHNIQUE: Multidetector CT imaging was performed following the standard protocol during bolus administration of intravenous contrast. CONTRAST: 163mL OMNIPAQUE IOHEXOL 300 MG/ML SOLN COMPARISON: DG OS CALCIS*R* dated 12/12/2013 FINDINGS: There is an ulcer along the lateral aspect of the calcaneus, extending to the cortical surface. There is rare fraction of bone and cortical osteolysis compatible with active osteomyelitis in a lateral calcaneus deep to the ulcer. Gas extends through the ulcer tract to the bone surface. There midfoot erosions in the medial cuneiform bone which may be associated with the patient's history of gout arthritis or ordinary degenerative disease. First MTP joint osteoarthritis is mild. Old avulsion fragments are present adjacent to the medial malleolus in the  deltoid ligament. Calcification of the posterior talofibular ligament is incidentally noted. No soft tissue abscess is identified. Grossly,  the Achilles tendon and plantar fascia appear within normal limits. IMPRESSION: Lateral hindfoot ulceration with osteomyelitis of the lateral calcaneal tuberosity (image 28 series 3). No soft tissue abscess. Electronically Signed By: Dereck Ligas M.D. On: 12/25/2013 15:46   Assessment/Plan 1. Osteomyelitis -has completed abx and underwent right bka for osteomyelitis -treatment nurse is performing dressing changes and monitoring site -keep f/u with Dr. Sharol Given  2. Unspecified venous (peripheral) insufficiency -contributes to his difficulty with wound healing along with PAD and DM  3. Atrial fibrillation -continues on eliquis for anticoagulation (indefinite), dig, cardizem with adequate rate control  4. Chronic diastolic CHF (congestive heart failure) -no s/s of acute exacerbation  -continues on lasix 40mg  daily and NAS CONCHO diet  5. Type II or unspecified type diabetes mellitus with peripheral circulatory disorders, not stated as uncontrolled(250.70) -CONCHO NAS diet, cont l,antus with novolog meal coverage, metformin (monitor bmp for cr, lactic acidosis), ace inhibitor -he tells me his glucose has been difficult to control recently which makes sense with his osteomyelitis  6. ESOPHAGEAL STRICTURE -with h/o dysphagia  -no active symptoms at this time, monitor swallowing and weight  7. GERD -cont omeprazole bid ac meals  8. HYPERTENSION -at goal with lisinopril, lasix, cardizem  9. Hyperlipidemia -not on statin therapy--should be due to his diabetes even if lipids are wnl as DMII is CAD equivalent, f/u flp  10. Hx of right BKA -here for short term rehab and wound care, currently requiring assistance with bathing, dressing, transfers due to new amputation  11. GOUT -had acute exacerbation in hospital as in hpi -may need allopurinol for  prophylaxis, try to avoid triggers, check uric acid level when stabilized  Functional status:  Currently requiring assistance with bathing, dressing and transfers, but feeding himself;  His wife is elderly and cannot take care of him since his amputation so he is here for rehab with the goal to return home when ready  Family/ staff Communication: pt's daughter came during the visit and her questions were answered  Labs/tests ordered:  Cbc, bmp in 1 wk, keep f/u appts with Dr. Sharol Given, [place], and podiatry as planned

## 2014-02-03 ENCOUNTER — Encounter: Payer: Self-pay | Admitting: Internal Medicine

## 2014-02-03 ENCOUNTER — Non-Acute Institutional Stay (SKILLED_NURSING_FACILITY): Payer: Medicare Other | Admitting: Internal Medicine

## 2014-02-03 DIAGNOSIS — F411 Generalized anxiety disorder: Secondary | ICD-10-CM

## 2014-02-03 DIAGNOSIS — E1165 Type 2 diabetes mellitus with hyperglycemia: Principal | ICD-10-CM

## 2014-02-03 DIAGNOSIS — IMO0002 Reserved for concepts with insufficient information to code with codable children: Secondary | ICD-10-CM

## 2014-02-03 DIAGNOSIS — E1129 Type 2 diabetes mellitus with other diabetic kidney complication: Secondary | ICD-10-CM

## 2014-02-03 DIAGNOSIS — I10 Essential (primary) hypertension: Secondary | ICD-10-CM

## 2014-02-03 NOTE — Progress Notes (Signed)
Patient ID: [name], male   DOB: 04-20-1935, 79 y.o.   MRN: [id number]    [name] living [facility]  Chief Complaint  Patient presents with  . Acute Visit    hyperglycemia, hypertension, anxiety   Allergies  Allergen Reactions  . Morphine Rash and Other (See Comments)    Irritability also   HPI 79 y/o male patient is here for STR after hospital admission with osteomyelitis. He is seen today for elevated blood sugar reading. His cbg have mostly been between 200-300s. His blood pressure has also been elevated. Noted bp readings in the facility. His anxiety has worsened as per staff Patient denies any other complaints  Review of Systems  Constitutional: Negative for fever, chills, diaphoresis.  HENT: Negative for congestion, hearing loss and sore throat.   Eyes: Negative for blurred vision, double vision and discharge.  Respiratory: Negative for cough, sputum production, shortness of breath and wheezing.   Cardiovascular: Negative for chest pain, palpitations, orthopnea  Gastrointestinal: Negative for heartburn, nausea, vomiting Neurological: Negative for dizziness, tingling, focal weakness and headaches.  Psychiatric/Behavioral: Negative for depression and memory loss. The patient is anxious.    Past Medical History  Diagnosis Date  . Fatigue   . Overweight   . Syncope and collapse   . GERD (gastroesophageal reflux disease)   . Right bundle branch block   . Unspecified venous (peripheral) insufficiency   . Phlebitis and thrombophlebitis of superficial vessels of lower extremities   . Cellulitis and abscess of leg, except foot   . Backache, unspecified   . Long term (current) use of anticoagulants   . Type II or unspecified type diabetes mellitus without mention of complication, not stated as uncontrolled   . Unspecified venous (peripheral) insufficiency   . Hypertension   . Gout, unspecified   . Heart failure   . Atrial fibrillation     permanent  . Esophageal  stricture   . Hx of colonic polyps 05-2006    (Adenomatous)Dr. [name]  . Diverticulosis 05-2006    Dr. [name]   . Tachycardia-bradycardia     s/p PPM  . Pacemaker- [name] 02/20/2013  . CHF (congestive heart failure)    Past Surgical History  Procedure Laterality Date  . Laminectomy    . Lumbar fusion    . Rotator cuff repair    . Ruptured rt rectus muscle    . Eye surgery    . Pacemaker insertion  8/12    SJM by [name] for tachy/brady syndrome  . I&d extremity Right 01/17/2014    Procedure: IRRIGATION AND DEBRIDEMENT RIGHT HEEL  WITH CULTURES AND BONE BIOPSY, placement of wound vac;  Surgeon: Theodoro Kos, DO;  Location: [place];  Service: Plastics;  Laterality: Right;  . Amputation Right 01/20/2014    Procedure: AMPUTATION BELOW KNEE- right;  Surgeon: [name], MD;  Location: [place];  Service: Orthopedics;  Laterality: Right;  Right Below Knee Amputation   Medication reviewed. See [facility]  Physical exam BP 180/94  Pulse 76  Temp(Src) 97.8 F (36.6 C)  Resp 18  Wt 161 lb (73.029 kg)  SpO2 96%  Constitutional: He is oriented to person, place, and time. He appears well-developed and well-nourished. No distress.  HENT:   Head: Normocephalic and atraumatic.   Mouth/Throat: Oropharynx is clear and moist.   Neck: Normal range of motion. Neck supple.  Cardiovascular: Normal rate and normal heart sounds. No murmur heard. Irregular rate Pulmonary/Chest: Effort normal and breath sounds normal.  Neurological: He is alert and oriented to person, place, and time.  Skin: Skin is warm and dry. He is not diaphoretic. Right BKA Psychiatric: He has a normal mood but is anxious   Labs 01/31/14 na 136, k 4.3, bun 30, cr 1.21, glu 118, ca 8.5  Assessment/plan  Uncontrolled dm evelated blood sugar readings. Will increase his lantus to 30 u sq daily for now. Check cbc, cmp next lab draw. Will change novolog to 5u for cbg 150-250, 8 u 250-350 and 10 u for cbg > 350  Uncontrolled  hypertension Monitor bp closely, will increase his lisinopril to 10 mg daily. Discontinue ibuprofen with concerns of worsening renal function. Continue diltiazem and lasix. Check bmp next lab 02/05/14  Anxiety Will have him on ativan 0.25 mg q12h prn for now

## 2014-02-04 ENCOUNTER — Other Ambulatory Visit: Payer: Self-pay | Admitting: *Deleted

## 2014-02-04 MED ORDER — LORAZEPAM 0.5 MG PO TABS: ORAL_TABLET | ORAL | Status: DC

## 2014-02-04 NOTE — Telephone Encounter (Signed)
[facility] [place] 

## 2014-02-06 ENCOUNTER — Ambulatory Visit: Payer: Medicare Other | Admitting: Infectious Disease

## 2014-02-06 ENCOUNTER — Telehealth: Payer: Self-pay | Admitting: Internal Medicine

## 2014-02-06 ENCOUNTER — Encounter: Payer: Self-pay | Admitting: Infectious Disease

## 2014-02-06 ENCOUNTER — Ambulatory Visit (INDEPENDENT_AMBULATORY_CARE_PROVIDER_SITE_OTHER): Payer: Medicare Other | Admitting: Infectious Disease

## 2014-02-06 ENCOUNTER — Ambulatory Visit: Payer: Medicare Other | Admitting: Podiatry

## 2014-02-06 VITALS — BP 148/74 | HR 87 | Temp 98.1°F

## 2014-02-06 DIAGNOSIS — T8740 Infection of amputation stump, unspecified extremity: Secondary | ICD-10-CM | POA: Insufficient documentation

## 2014-02-06 DIAGNOSIS — A491 Streptococcal infection, unspecified site: Secondary | ICD-10-CM

## 2014-02-06 DIAGNOSIS — M86179 Other acute osteomyelitis, unspecified ankle and foot: Secondary | ICD-10-CM

## 2014-02-06 DIAGNOSIS — B965 Pseudomonas (aeruginosa) (mallei) (pseudomallei) as the cause of diseases classified elsewhere: Secondary | ICD-10-CM

## 2014-02-06 DIAGNOSIS — L989 Disorder of the skin and subcutaneous tissue, unspecified: Secondary | ICD-10-CM

## 2014-02-06 DIAGNOSIS — M86172 Other acute osteomyelitis, left ankle and foot: Secondary | ICD-10-CM

## 2014-02-06 DIAGNOSIS — A498 Other bacterial infections of unspecified site: Secondary | ICD-10-CM

## 2014-02-06 DIAGNOSIS — B951 Streptococcus, group B, as the cause of diseases classified elsewhere: Secondary | ICD-10-CM

## 2014-02-06 MED ORDER — DOXYCYCLINE HYCLATE 100 MG PO TABS: 100.0000 mg | ORAL_TABLET | Freq: Two times a day (BID) | ORAL | Status: DC

## 2014-02-06 MED ORDER — CEPHALEXIN 500 MG PO CAPS: 500.0000 mg | ORAL_CAPSULE | Freq: Four times a day (QID) | ORAL | Status: DC

## 2014-02-06 NOTE — Telephone Encounter (Signed)
Pt is in rehab for leg amputation. [name], [place] [address] states pt is doing so well, they are going to send him home in 2-3 wks. Wife  pt states that he is no where ready to come home. Wife doesn't see how she can do it. Wife would like to talk w/ you and see what perhaps you can do to help w/ this matter. Mrs Osika states she has no one else to turn to for advice. She would appreciate a call

## 2014-02-06 NOTE — Progress Notes (Signed)
Subjective:    Patient ID: Synthia Innocent, male    DOB: 10/16/1935, 79 y.o.   MRN: [id number]  HPI  79year-old patient with past medical history significant for diabetes mellitus, CHF, atrial fibrillation on chronic anticoagulation, syncope with tachybradycardia syndrome  status post pacemaker placement who was referred to [place] by Dr. Arnoldo Morale and Dr. 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  He stated he's had a wound on his heel for approximately half a year but is worsened in the last 6-8 weeks. He's been followed closely by Dr. Dellia Nims in the wound care clinic his prescribed him doxycycline and more recently ciprofloxacin after recovering Pseudomonas from a deep tissue culture.  Pseudomonas was sensitive to all antibiotics tested including ciprofloxacin and levofloxacin with MIC the left less than equal 0.25 and 0.5 also sensitive to Zosyn imipenem Ceftaz and cefepime gentamicin and tobramycin.  Patient had undergone a CT scan of the heel which showed:  Osteolysis and active osteomyelitis of the calcaneus.  He states that ciprofloxacin is improved his wound but the wound is now getting bigger after debridement from wound care and treatment with the hyperbaric chamber.   When I first saw him I had cautioned that he would need BKA to cure his chronic deep bone infection but that we could pursue bone biopsy followed by targetted IV abx followed by indefinite oral abx. I had referred him to Orthopedics but he ended up being admitted to [facility] for worsening infection, foul odor erythema from around the wound.   Dr. Migdalia Dk took him to the OR and did a bone biopsy for culture which yielded abundant GBS.   The following day however he decided he had better proceed with curative BKA which Dr. Sharol Given performed. She was seen by my partners in ID and abx were stopped within 72 hours post surgery.  He has been dc to [facility]. Unfortunately he appears to have developed  a soft tissue infection involving his stump with purulence at the site and Dr Sharol Given started him on doxy--though I do not see it in the [facility].  He has also noticed a new erytyhematous lesion on big toe on the left side and darkening of his skin there.   [name]  Review of Systems  Constitutional: Negative for fever, chills, diaphoresis, appetite change and fatigue.  Eyes: Negative for photophobia and visual disturbance.  Respiratory: Negative for cough, chest tightness, shortness of breath and wheezing.   Cardiovascular: Negative for chest pain and palpitations.  Gastrointestinal: Negative for nausea, vomiting and diarrhea.  Musculoskeletal: Positive for arthralgias and gait problem. Negative for back pain and myalgias.  Skin: Positive for wound. Negative for color change, pallor and rash.  Neurological: Negative for dizziness, tremors, weakness and light-headedness.  Hematological: Bruises/bleeds easily.  Psychiatric/Behavioral: Negative for behavioral problems, confusion, sleep disturbance, dysphoric mood, decreased concentration and agitation.       Objective:   Physical Exam  Constitutional: He is oriented to person, place, and time. He appears well-developed and well-nourished. No distress.  HENT:  Head: Normocephalic and atraumatic.  Mouth/Throat: Oropharynx is clear and moist. No oropharyngeal exudate.  Eyes: Conjunctivae and EOM are normal. No scleral icterus.  Neck: Normal range of motion. Neck supple.  Cardiovascular: Normal rate and normal heart sounds.  An irregularly irregular rhythm present. Exam reveals no friction rub.   No murmur heard. Pulmonary/Chest: Effort normal and breath sounds normal. No respiratory distress. He has no wheezes. He has no rales. He exhibits  no tenderness.    Abdominal: Soft. Bowel sounds are normal. He exhibits no distension. There is no tenderness. There is no rebound.  Musculoskeletal: He exhibits no edema and no tenderness.        Arms:      Legs: Neurological: He is alert and oriented to person, place, and time. He exhibits normal muscle tone. Coordination normal.  Skin: Skin is warm and dry. He is not diaphoretic. There is erythema. No pallor.  Psychiatric: He has a normal mood and affect. His behavior is normal. Judgment and thought content normal.   Right BKA site: Some purulence in middle portion of the wound, see 2 pictures below:        Left foot with venous stasis changes and right toe with area of erythema:              Assessment & Plan:    Stump infection: I sampled this area for culture --wrote for doxy to continue and Added keflex 500mg  qid for GBS, GAS and MSSA coverage  Will try to interpret the superficial cx with a "grain of salt"  I spent greater than 25 minutes with the patient including greater than 50% of time in face to face counsel of the patient and in coordination of their care.    Diabetic osteomyelitis of the calcaneus: cured with BKA, GBS was culprit ID on deep culture   Left leg with new erythematous area: area is concerning for embolic lesion ? Due to PVD vs endocarditis? He had normal ABI's in January.  We will watch him closely as should his MD's in the facility  I was going to have him followup in one month but I may actually like to have him come back next week for followup.   Could get blood cultures but low yield on abx and could look to pursue TEE but would require admission. He had this lesion noticed during last in patient stay

## 2014-02-07 NOTE — Telephone Encounter (Signed)
The discharge is under the control of the attending physician. She should set up a meeting with both social work and the attending at rehab to voice her concerns. If he is not able to function at home at the time of DC from rehab he could be admitted to skilled nursing. BUT this has to be done by a Face to face evaluation at the facility BY the rounding team.

## 2014-02-09 LAB — WOUND CULTURE: GRAM STAIN: NONE SEEN

## 2014-02-11 ENCOUNTER — Ambulatory Visit (INDEPENDENT_AMBULATORY_CARE_PROVIDER_SITE_OTHER): Payer: Medicare Other | Admitting: Infectious Disease

## 2014-02-11 ENCOUNTER — Encounter: Payer: Self-pay | Admitting: Infectious Disease

## 2014-02-11 VITALS — BP 154/70 | HR 76 | Temp 98.1°F

## 2014-02-11 DIAGNOSIS — Z950 Presence of cardiac pacemaker: Secondary | ICD-10-CM

## 2014-02-11 DIAGNOSIS — M86171 Other acute osteomyelitis, right ankle and foot: Secondary | ICD-10-CM

## 2014-02-11 DIAGNOSIS — T8740 Infection of amputation stump, unspecified extremity: Secondary | ICD-10-CM

## 2014-02-11 DIAGNOSIS — B961 Klebsiella pneumoniae [K. pneumoniae] as the cause of diseases classified elsewhere: Secondary | ICD-10-CM

## 2014-02-11 DIAGNOSIS — L989 Disorder of the skin and subcutaneous tissue, unspecified: Secondary | ICD-10-CM

## 2014-02-11 DIAGNOSIS — A498 Other bacterial infections of unspecified site: Secondary | ICD-10-CM

## 2014-02-11 DIAGNOSIS — T879 Unspecified complications of amputation stump: Secondary | ICD-10-CM

## 2014-02-11 DIAGNOSIS — A491 Streptococcal infection, unspecified site: Secondary | ICD-10-CM

## 2014-02-11 DIAGNOSIS — M86179 Other acute osteomyelitis, unspecified ankle and foot: Secondary | ICD-10-CM

## 2014-02-11 DIAGNOSIS — B951 Streptococcus, group B, as the cause of diseases classified elsewhere: Secondary | ICD-10-CM

## 2014-02-11 NOTE — Progress Notes (Signed)
Subjective:    Patient ID: [name], male    DOB: October 10, 1935, 79 y.o.   MRN: [id number]  HPI   79year-old patient with past medical history significant for diabetes mellitus, CHF, atrial fibrillation on chronic anticoagulation, syncope with tachybradycardia syndrome  status post pacemaker placement who was referred to [place] by Dr. Arnoldo Cross and Dr. 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  He stated he's had a wound on his heel for approximately half a year but is worsened in the last 6-8 weeks. He's been followed closely by Dr. Dellia Cross in the wound care clinic his prescribed him doxycycline and more recently ciprofloxacin after recovering Pseudomonas from a deep tissue culture.  Pseudomonas was sensitive to all antibiotics tested including ciprofloxacin and levofloxacin with MIC the left less than equal 0.25 and 0.5 also sensitive to Zosyn imipenem Ceftaz and cefepime gentamicin and tobramycin.  Patient had undergone a CT scan of the heel which showed:  Osteolysis and active osteomyelitis of the calcaneus.  He states that ciprofloxacin is improved his wound but the wound is now getting bigger after debridement from wound care and treatment with the hyperbaric chamber.   When I first saw him I had cautioned that he would need BKA to cure his chronic deep bone infection but that we could pursue bone biopsy followed by targetted IV abx followed by indefinite oral abx. I had referred him to Orthopedics but he ended up being admitted to [facility] for worsening infection, foul odor erythema from around the wound.   Dr. Migdalia Cross took him to the OR and did a bone biopsy for culture which yielded abundant GBS.   The following day however he decided he had better proceed with curative BKA which Dr. Sharol Cross performed. She was seen by my partners in ID and abx were stopped within 72 hours post surgery.  He has been dc to [facility]. Unfortunately he appears to have  developed a soft tissue infection involving his stump with purulence at the site and Dr [name] started him on doxy--though I do not see it in the [facility].  He has also noticed a new erytyhematous lesion on big toe on the left side and darkening of his skin there.   [name] LIving  I saw him last week and cultured the area of purulence and added keflex and he grew a Klebsiella PNA that was S to this abx.  Since then the would has worsened in my opinion with worsening dehiscence and purulence and one of the staples loose today. He is to see Dr. Sharol Cross tomorrow. NO fevers, chills or systemic ssx.  Review of Systems  Constitutional: Negative for fever, chills, diaphoresis, appetite change and fatigue.  Eyes: Negative for photophobia and visual disturbance.  Respiratory: Negative for cough, chest tightness, shortness of breath and wheezing.   Cardiovascular: Negative for chest pain and palpitations.  Gastrointestinal: Negative for nausea, vomiting and diarrhea.  Musculoskeletal: Positive for arthralgias. Negative for back pain and myalgias.  Skin: Positive for wound. Negative for color change, pallor and rash.  Neurological: Negative for dizziness, tremors, weakness and light-headedness.  Hematological: Bruises/bleeds easily.  Psychiatric/Behavioral: Negative for behavioral problems, confusion, sleep disturbance, dysphoric mood, decreased concentration and agitation.       Objective:   Physical Exam  Constitutional: He is oriented to person, place, and time. He appears well-developed and well-nourished. No distress.  HENT:  Head: Normocephalic and atraumatic.  Mouth/Throat: Oropharynx is clear and moist. No oropharyngeal exudate.  Eyes:  Conjunctivae and EOM are normal. No scleral icterus.  Neck: Normal range of motion. Neck supple.  Cardiovascular: Normal rate and normal heart sounds.  An irregularly irregular rhythm present. Exam reveals no friction rub.   No murmur heard. Pulmonary/Chest:  Effort normal and breath sounds normal. No respiratory distress. He has no wheezes. He has no rales. He exhibits no tenderness.    Abdominal: Soft. Bowel sounds are normal. He exhibits no distension. There is no tenderness. There is no rebound.  Musculoskeletal: He exhibits no edema and no tenderness.       Arms:      Legs: Neurological: He is alert and oriented to person, place, and time. He exhibits normal muscle tone. Coordination normal.  Skin: Skin is warm and dry. He is not diaphoretic. There is erythema. No pallor.  Psychiatric: He has a normal mood and affect. His behavior is normal. Judgment and thought content normal.   Right BKA site:   Last weeks pictures:        Todays pictures with worsening dehiscence and purulence:          Left foot with venous stasis changes and right toe with area of erythema, last weeks pic:     Today's picture, seems stable:                  Assessment & Plan:    Stump infection:  This looks much worse --I sent another swab for culture but I THINK HE WILL NEED to have this addressed surgically by Dr [name] who will see tomorrow --continue doxy and keflex in the interim     Diabetic osteomyelitis of the calcaneus: cured with BKA, GBS was culprit ID on deep culture   Left leg with new erythematous area: area is concerning for embolic lesion ? Due to PVD vs endocarditis? He had normal ABI's in January.    I will check with his Cardiologist to consider at least a TTE

## 2014-02-11 NOTE — Telephone Encounter (Signed)
pts wife aware but she would still like to Dr Arnoldo Morale personally just for some encouraging words

## 2014-02-12 ENCOUNTER — Ambulatory Visit: Payer: Medicare Other | Admitting: Infectious Disease

## 2014-02-13 ENCOUNTER — Encounter (HOSPITAL_COMMUNITY): Payer: Self-pay | Admitting: *Deleted

## 2014-02-13 ENCOUNTER — Other Ambulatory Visit (HOSPITAL_COMMUNITY): Payer: Self-pay | Admitting: Orthopedic Surgery

## 2014-02-13 ENCOUNTER — Ambulatory Visit (INDEPENDENT_AMBULATORY_CARE_PROVIDER_SITE_OTHER): Payer: Medicare Other | Admitting: Podiatry

## 2014-02-13 ENCOUNTER — Encounter: Payer: Self-pay | Admitting: Podiatry

## 2014-02-13 ENCOUNTER — Telehealth: Payer: Self-pay | Admitting: Internal Medicine

## 2014-02-13 ENCOUNTER — Encounter (HOSPITAL_COMMUNITY): Payer: Self-pay | Admitting: Pharmacy Technician

## 2014-02-13 VITALS — BP 152/67 | HR 71 | Resp 16

## 2014-02-13 DIAGNOSIS — B351 Tinea unguium: Secondary | ICD-10-CM

## 2014-02-13 DIAGNOSIS — M79609 Pain in unspecified limb: Secondary | ICD-10-CM

## 2014-02-13 MED ORDER — CEFAZOLIN SODIUM-DEXTROSE 2-3 GM-% IV SOLR
2.0000 g | INTRAVENOUS | Status: AC
  Administered 2014-02-14: 2 g via INTRAVENOUS
  Filled 2014-02-13: qty 50

## 2014-02-13 NOTE — Progress Notes (Signed)
Mr Cueto is at [facility].  I spoke with Katharine Look, his nurse and gave instructions regarding medications.  I faxed Peri Op Device Orders to device clinic- [place].

## 2014-02-13 NOTE — Telephone Encounter (Signed)
Patient Information:  Caller Name: Meg  Phone: [phone]  Patient: [name]  Gender: Male  DOB: 10/09/1935  Age: 79 Years  PCP: Benay Pillow (Adults only)  Office Follow Up:  Does the office need to follow up with this patient?: Yes  Instructions For The Office: Left message on voicemail  RN Note:  Left message to contact the office .  Symptoms  Reason For Call & Symptoms: Meg Brain RN with t [facility] calling for patient last AC 1 reading .  Attempted to call back and reached voicemail. Message Left to return call to the office  Reviewed Health History In EMR: [name]  Reviewed Medications In EMR: [name]  Reviewed Allergies In EMR: [name]  Reviewed Surgeries / Procedures: [name]  Date of Onset of Symptoms: 02/13/2014  Guideline(s) Used:  No Protocol Available - Information Only  Disposition Per Guideline:   Discuss with PCP and Callback by Nurse Today  Reason For Disposition Reached:   Nursing judgment  Advice Given:  Call Back If:  New symptoms develop  You become worse.  RN Overrode Recommendation:  Follow Up With Office Later  Left message on voicemail to contact the office

## 2014-02-14 ENCOUNTER — Ambulatory Visit (HOSPITAL_COMMUNITY): Payer: Medicare Other | Admitting: Anesthesiology

## 2014-02-14 ENCOUNTER — Encounter (HOSPITAL_COMMUNITY): Payer: Self-pay | Admitting: *Deleted

## 2014-02-14 ENCOUNTER — Encounter (HOSPITAL_COMMUNITY): Payer: Medicare Other | Admitting: Anesthesiology

## 2014-02-14 ENCOUNTER — Encounter (HOSPITAL_COMMUNITY)
Admission: RE | Disposition: A | Discharge status: Skilled Nursing Facility | Payer: Self-pay | Source: Ambulatory Visit | Attending: Orthopedic Surgery

## 2014-02-14 ENCOUNTER — Ambulatory Visit (HOSPITAL_COMMUNITY)
Admission: RE | Admit: 2014-02-14 | Discharge: 2014-02-14 | Disposition: A | Discharge status: Skilled Nursing Facility | Payer: Medicare Other | Source: Ambulatory Visit | Attending: Orthopedic Surgery | Admitting: Orthopedic Surgery

## 2014-02-14 DIAGNOSIS — K219 Gastro-esophageal reflux disease without esophagitis: Secondary | ICD-10-CM | POA: Insufficient documentation

## 2014-02-14 DIAGNOSIS — T879 Unspecified complications of amputation stump: Secondary | ICD-10-CM

## 2014-02-14 DIAGNOSIS — M549 Dorsalgia, unspecified: Secondary | ICD-10-CM | POA: Insufficient documentation

## 2014-02-14 DIAGNOSIS — K5730 Diverticulosis of large intestine without perforation or abscess without bleeding: Secondary | ICD-10-CM | POA: Insufficient documentation

## 2014-02-14 DIAGNOSIS — Z7901 Long term (current) use of anticoagulants: Secondary | ICD-10-CM | POA: Insufficient documentation

## 2014-02-14 DIAGNOSIS — T8789 Other complications of amputation stump: Secondary | ICD-10-CM | POA: Insufficient documentation

## 2014-02-14 DIAGNOSIS — Z860100 Personal history of colon polyps, unspecified: Secondary | ICD-10-CM | POA: Insufficient documentation

## 2014-02-14 DIAGNOSIS — Y835 Amputation of limb(s) as the cause of abnormal reaction of the patient, or of later complication, without mention of misadventure at the time of the procedure: Secondary | ICD-10-CM | POA: Insufficient documentation

## 2014-02-14 DIAGNOSIS — I872 Venous insufficiency (chronic) (peripheral): Secondary | ICD-10-CM | POA: Insufficient documentation

## 2014-02-14 DIAGNOSIS — I495 Sick sinus syndrome: Secondary | ICD-10-CM | POA: Insufficient documentation

## 2014-02-14 DIAGNOSIS — K222 Esophageal obstruction: Secondary | ICD-10-CM | POA: Insufficient documentation

## 2014-02-14 DIAGNOSIS — E1159 Type 2 diabetes mellitus with other circulatory complications: Secondary | ICD-10-CM | POA: Insufficient documentation

## 2014-02-14 DIAGNOSIS — I509 Heart failure, unspecified: Secondary | ICD-10-CM | POA: Insufficient documentation

## 2014-02-14 DIAGNOSIS — I96 Gangrene, not elsewhere classified: Secondary | ICD-10-CM | POA: Insufficient documentation

## 2014-02-14 DIAGNOSIS — I4510 Unspecified right bundle-branch block: Secondary | ICD-10-CM | POA: Insufficient documentation

## 2014-02-14 DIAGNOSIS — I4891 Unspecified atrial fibrillation: Secondary | ICD-10-CM | POA: Insufficient documentation

## 2014-02-14 DIAGNOSIS — M109 Gout, unspecified: Secondary | ICD-10-CM | POA: Insufficient documentation

## 2014-02-14 DIAGNOSIS — Z950 Presence of cardiac pacemaker: Secondary | ICD-10-CM | POA: Insufficient documentation

## 2014-02-14 DIAGNOSIS — E663 Overweight: Secondary | ICD-10-CM | POA: Insufficient documentation

## 2014-02-14 DIAGNOSIS — I10 Essential (primary) hypertension: Secondary | ICD-10-CM | POA: Insufficient documentation

## 2014-02-14 HISTORY — DX: Heart failure, unspecified: I50.9

## 2014-02-14 HISTORY — PX: AMPUTATION: SHX166

## 2014-02-14 LAB — CBC
HEMATOCRIT: 28.2 % — AB (ref 39.0–52.0)
HEMOGLOBIN: 9.4 g/dL — AB (ref 13.0–17.0)
MCH: 27 pg (ref 26.0–34.0)
MCHC: 33.3 g/dL (ref 30.0–36.0)
MCV: 81 fL (ref 78.0–100.0)
Platelets: 288 10*3/uL (ref 150–400)
RBC: 3.48 MIL/uL — AB (ref 4.22–5.81)
RDW: 15.7 % — ABNORMAL HIGH (ref 11.5–15.5)
WBC: 10.5 10*3/uL (ref 4.0–10.5)

## 2014-02-14 LAB — COMPREHENSIVE METABOLIC PANEL
ALT: 10 U/L (ref 0–53)
AST: 15 U/L (ref 0–37)
Albumin: 2.8 g/dL — ABNORMAL LOW (ref 3.5–5.2)
Alkaline Phosphatase: 125 U/L — ABNORMAL HIGH (ref 39–117)
BUN: 35 mg/dL — AB (ref 6–23)
CO2: 24 meq/L (ref 19–32)
Calcium: 9.5 mg/dL (ref 8.4–10.5)
Chloride: 97 mEq/L (ref 96–112)
Creatinine, Ser: 1.21 mg/dL (ref 0.50–1.35)
GFR calc Af Amer: 65 mL/min — ABNORMAL LOW (ref >90)
GFR, EST NON AFRICAN AMERICAN: 56 mL/min — AB (ref >90)
GLUCOSE: 110 mg/dL — AB (ref 70–99)
Potassium: 4.7 mEq/L (ref 3.7–5.3)
SODIUM: 135 meq/L — AB (ref 137–147)
Total Bilirubin: 0.5 mg/dL (ref 0.3–1.2)
Total Protein: 7.7 g/dL (ref 6.0–8.3)

## 2014-02-14 LAB — APTT: aPTT: 39 seconds — ABNORMAL HIGH (ref 24–37)

## 2014-02-14 LAB — GLUCOSE, CAPILLARY
Glucose-Capillary: 107 mg/dL — ABNORMAL HIGH (ref 70–99)
Glucose-Capillary: 76 mg/dL (ref 70–99)

## 2014-02-14 LAB — PROTIME-INR
INR: 1.3 (ref 0.00–1.49)
PROTHROMBIN TIME: 15.9 s — AB (ref 11.6–15.2)

## 2014-02-14 SURGERY — AMPUTATION BELOW KNEE
Anesthesia: General | Site: Leg Lower | Laterality: Right

## 2014-02-14 MED ORDER — HYDROCODONE-ACETAMINOPHEN 5-325 MG PO TABS
1.0000 | ORAL_TABLET | Freq: Four times a day (QID) | ORAL | Status: DC | PRN
  Administered 2014-02-14: 1 via ORAL

## 2014-02-14 MED ORDER — HYDROMORPHONE HCL PF 1 MG/ML IJ SOLN
INTRAMUSCULAR | Status: AC
  Administered 2014-02-14: 0.5 mg via INTRAVENOUS
  Filled 2014-02-14: qty 1

## 2014-02-14 MED ORDER — LACTATED RINGERS IV SOLN
INTRAVENOUS | Status: DC
  Administered 2014-02-14: 14:00:00 via INTRAVENOUS

## 2014-02-14 MED ORDER — LIDOCAINE HCL (CARDIAC) 20 MG/ML IV SOLN
INTRAVENOUS | Status: DC | PRN
  Administered 2014-02-14: 60 mg via INTRAVENOUS

## 2014-02-14 MED ORDER — HYDROMORPHONE HCL PF 1 MG/ML IJ SOLN
0.2500 mg | INTRAMUSCULAR | Status: DC | PRN
  Administered 2014-02-14 (×2): 0.5 mg via INTRAVENOUS
  Administered 2014-02-14: 17:00:00 via INTRAVENOUS
  Administered 2014-02-14: 0.5 mg via INTRAVENOUS

## 2014-02-14 MED ORDER — OXYCODONE HCL 5 MG PO TABS
5.0000 mg | ORAL_TABLET | Freq: Once | ORAL | Status: AC | PRN
  Administered 2014-02-14: 5 mg via ORAL

## 2014-02-14 MED ORDER — FENTANYL CITRATE 0.05 MG/ML IJ SOLN
INTRAMUSCULAR | Status: AC
  Filled 2014-02-14: qty 5

## 2014-02-14 MED ORDER — FENTANYL CITRATE 0.05 MG/ML IJ SOLN
INTRAMUSCULAR | Status: AC
  Filled 2014-02-14: qty 2

## 2014-02-14 MED ORDER — HYDROCODONE-ACETAMINOPHEN 5-325 MG PO TABS
ORAL_TABLET | ORAL | Status: AC
  Administered 2014-02-14: 1 via ORAL
  Filled 2014-02-14: qty 1

## 2014-02-14 MED ORDER — PROPOFOL 10 MG/ML IV BOLUS
INTRAVENOUS | Status: AC
  Filled 2014-02-14: qty 20

## 2014-02-14 MED ORDER — FENTANYL CITRATE 0.05 MG/ML IJ SOLN
25.0000 ug | INTRAMUSCULAR | Status: DC | PRN
  Administered 2014-02-14 (×3): 50 ug via INTRAVENOUS

## 2014-02-14 MED ORDER — HYDROMORPHONE HCL PF 1 MG/ML IJ SOLN
0.2500 mg | INTRAMUSCULAR | Status: DC | PRN
  Administered 2014-02-14: 0.5 mg via INTRAVENOUS

## 2014-02-14 MED ORDER — OXYCODONE HCL 5 MG PO TABS
ORAL_TABLET | ORAL | Status: AC
  Filled 2014-02-14: qty 1

## 2014-02-14 MED ORDER — ONDANSETRON HCL 4 MG/2ML IJ SOLN: 4.0000 mg | Freq: Four times a day (QID) | INTRAMUSCULAR | Status: DC | PRN

## 2014-02-14 MED ORDER — 0.9 % SODIUM CHLORIDE (POUR BTL) OPTIME
TOPICAL | Status: DC | PRN
  Administered 2014-02-14: 1000 mL

## 2014-02-14 MED ORDER — OXYCODONE HCL 5 MG/5ML PO SOLN: 5.0000 mg | Freq: Once | ORAL | Status: AC | PRN

## 2014-02-14 MED ORDER — FENTANYL CITRATE 0.05 MG/ML IJ SOLN
INTRAMUSCULAR | Status: DC | PRN
  Administered 2014-02-14 (×2): 50 ug via INTRAVENOUS

## 2014-02-14 MED ORDER — PROPOFOL 10 MG/ML IV BOLUS
INTRAVENOUS | Status: DC | PRN
  Administered 2014-02-14: 120 mg via INTRAVENOUS

## 2014-02-14 MED ORDER — FENTANYL CITRATE 0.05 MG/ML IJ SOLN
INTRAMUSCULAR | Status: AC
  Administered 2014-02-14: 50 ug via INTRAVENOUS
  Filled 2014-02-14: qty 2

## 2014-02-14 MED ORDER — HYDROCODONE-ACETAMINOPHEN 5-325 MG PO TABS: 1.0000 | ORAL_TABLET | Freq: Four times a day (QID) | ORAL | Status: DC | PRN

## 2014-02-14 SURGICAL SUPPLY — 50 items
BANDAGE ESMARK 6X9 LF (GAUZE/BANDAGES/DRESSINGS) IMPLANT
BANDAGE GAUZE ELAST BULKY 4 IN (GAUZE/BANDAGES/DRESSINGS) IMPLANT
BLADE SAW RECIP 87.9 MT (BLADE) ×3 IMPLANT
BLADE SURG 21 STRL SS (BLADE) ×3 IMPLANT
BNDG COHESIVE 6X5 TAN STRL LF (GAUZE/BANDAGES/DRESSINGS) ×3 IMPLANT
BNDG ESMARK 6X9 LF (GAUZE/BANDAGES/DRESSINGS)
BNDG GAUZE ELAST 4 BULKY (GAUZE/BANDAGES/DRESSINGS) ×3 IMPLANT
COVER SURGICAL LIGHT HANDLE (MISCELLANEOUS) ×3 IMPLANT
CUFF TOURNIQUET SINGLE 34IN LL (TOURNIQUET CUFF) IMPLANT
CUFF TOURNIQUET SINGLE 44IN (TOURNIQUET CUFF) IMPLANT
DRAIN PENROSE 1/2X12 LTX STRL (WOUND CARE) IMPLANT
DRAPE EXTREMITY T 121X128X90 (DRAPE) ×3 IMPLANT
DRAPE PROXIMA HALF (DRAPES) ×6 IMPLANT
DRAPE U-SHAPE 47X51 STRL (DRAPES) ×6 IMPLANT
DRSG ADAPTIC 3X8 NADH LF (GAUZE/BANDAGES/DRESSINGS) ×3 IMPLANT
DRSG PAD ABDOMINAL 8X10 ST (GAUZE/BANDAGES/DRESSINGS) ×3 IMPLANT
DURAPREP 26ML APPLICATOR (WOUND CARE) ×3 IMPLANT
ELECT REM PT RETURN 9FT ADLT (ELECTROSURGICAL) ×3
ELECTRODE REM PT RTRN 9FT ADLT (ELECTROSURGICAL) ×1 IMPLANT
GLOVE BIOGEL PI IND STRL 7.5 (GLOVE) ×1 IMPLANT
GLOVE BIOGEL PI IND STRL 9 (GLOVE) ×1 IMPLANT
GLOVE BIOGEL PI INDICATOR 7.5 (GLOVE) ×2
GLOVE BIOGEL PI INDICATOR 9 (GLOVE) ×2
GLOVE ECLIPSE 7.0 STRL STRAW (GLOVE) ×3 IMPLANT
GLOVE EUDERMIC 7 POWDERFREE (GLOVE) ×3 IMPLANT
GLOVE SURG ORTHO 9.0 STRL STRW (GLOVE) ×3 IMPLANT
GLOVE SURG SS PI 7.0 STRL IVOR (GLOVE) ×3 IMPLANT
GOWN STRL REUS W/ TWL LRG LVL3 (GOWN DISPOSABLE) ×1 IMPLANT
GOWN STRL REUS W/ TWL XL LVL3 (GOWN DISPOSABLE) ×2 IMPLANT
GOWN STRL REUS W/TWL LRG LVL3 (GOWN DISPOSABLE) ×2
GOWN STRL REUS W/TWL XL LVL3 (GOWN DISPOSABLE) ×4
KIT BASIN OR (CUSTOM PROCEDURE TRAY) ×3 IMPLANT
KIT ROOM TURNOVER OR (KITS) ×3 IMPLANT
MANIFOLD NEPTUNE II (INSTRUMENTS) ×3 IMPLANT
NS IRRIG 1000ML POUR BTL (IV SOLUTION) ×3 IMPLANT
PACK GENERAL/GYN (CUSTOM PROCEDURE TRAY) ×3 IMPLANT
PAD ARMBOARD 7.5X6 YLW CONV (MISCELLANEOUS) ×6 IMPLANT
SPONGE GAUZE 4X4 12PLY (GAUZE/BANDAGES/DRESSINGS) ×3 IMPLANT
SPONGE LAP 18X18 X RAY DECT (DISPOSABLE) ×3 IMPLANT
STAPLER VISISTAT 35W (STAPLE) ×3 IMPLANT
STOCKINETTE IMPERVIOUS LG (DRAPES) ×3 IMPLANT
SUT ETHILON 2 0 PSLX (SUTURE) ×9 IMPLANT
SUT PDS AB 1 CT  36 (SUTURE) ×4
SUT PDS AB 1 CT 36 (SUTURE) ×2 IMPLANT
SUT SILK 2 0 (SUTURE) ×2
SUT SILK 2-0 18XBRD TIE 12 (SUTURE) ×1 IMPLANT
TOWEL OR 17X24 6PK STRL BLUE (TOWEL DISPOSABLE) ×3 IMPLANT
TOWEL OR 17X26 10 PK STRL BLUE (TOWEL DISPOSABLE) ×3 IMPLANT
TUBE ANAEROBIC SPECIMEN COL (MISCELLANEOUS) IMPLANT
WATER STERILE IRR 1000ML POUR (IV SOLUTION) IMPLANT

## 2014-02-14 NOTE — Anesthesia Preprocedure Evaluation (Signed)
Anesthesia Evaluation  Patient identified by MRN, date of birth, ID band Patient awake    Reviewed: Allergy & Precautions, H&P , NPO status , Patient's Chart, lab work & pertinent test results  Airway Mallampati: II  Neck ROM: full    Dental   Pulmonary          Cardiovascular hypertension, + Peripheral Vascular Disease and +CHF + dysrhythmias Atrial Fibrillation + pacemaker     Neuro/Psych    GI/Hepatic GERD-  ,  Endo/Other  diabetes  Renal/GU      Musculoskeletal   Abdominal   Peds  Hematology   Anesthesia Other Findings   Reproductive/Obstetrics                           Anesthesia Physical Anesthesia Plan  ASA: III  Anesthesia Plan: General   Post-op Pain Management:    Induction: Intravenous  Airway Management Planned: LMA  Additional Equipment:   Intra-op Plan:   Post-operative Plan:   Informed Consent: I have reviewed the patients History and Physical, chart, labs and discussed the procedure including the risks, benefits and alternatives for the proposed anesthesia with the patient or authorized representative who has indicated his/her understanding and acceptance.     Plan Discussed with: CRNA, Anesthesiologist and Surgeon  Anesthesia Plan Comments:         Anesthesia Quick Evaluation

## 2014-02-14 NOTE — Transfer of Care (Signed)
Immediate Anesthesia Transfer of Care Note  Patient: [name]  Procedure(s) Performed: Procedure(s) with comments: AMPUTATION BELOW KNEE (Right) - Right Below Knee Amputation Revision  Patient Location: PACU  Anesthesia Type:General  Level of Consciousness: awake, alert  and oriented  Airway & Oxygen Therapy: Patient Spontanous Breathing  Post-op Assessment: Report given to PACU RN and Post -op Vital signs reviewed and stable  Post vital signs: Reviewed and stable  Complications: No apparent anesthesia complications

## 2014-02-14 NOTE — H&P (Signed)
[name] is an 79 y.o. male.   Chief Complaint: Dehiscence right transtibial amputation HPI: Patient is a 79 year old gentleman with progressive gangrenous dehiscence of the transtibial amputation.  Past Medical History  Diagnosis Date  . Fatigue   . Overweight   . Syncope and collapse   . GERD (gastroesophageal reflux disease)   . Right bundle branch block   . Unspecified venous (peripheral) insufficiency   . Phlebitis and thrombophlebitis of superficial vessels of lower extremities   . Cellulitis and abscess of leg, except foot   . Backache, unspecified   . Long term (current) use of anticoagulants   . Type II or unspecified type diabetes mellitus without mention of complication, not stated as uncontrolled   . Unspecified venous (peripheral) insufficiency   . Hypertension   . Gout, unspecified   . Heart failure   . Atrial fibrillation     permanent  . Esophageal stricture   . Hx of colonic polyps 05-2006    (Adenomatous)Dr. [name]  . Diverticulosis 05-2006    Dr. [name]   . Tachycardia-bradycardia     s/p PPM  . Pacemaker- [name] 02/20/2013  . CHF (congestive heart failure)     Past Surgical History  Procedure Laterality Date  . Laminectomy    . Lumbar fusion    . Rotator cuff repair    . Ruptured rt rectus muscle    . Eye surgery    . Pacemaker insertion  8/12    SJM by [name] for tachy/brady syndrome  . I&d extremity Right 01/17/2014    Procedure: IRRIGATION AND DEBRIDEMENT RIGHT HEEL  WITH CULTURES AND BONE BIOPSY, placement of wound vac;  Surgeon: Theodoro Kos, DO;  Location: [place];  Service: Plastics;  Laterality: Right;  . Amputation Right 01/20/2014    Procedure: AMPUTATION BELOW KNEE- right;  Surgeon: [name], MD;  Location: [place];  Service: Orthopedics;  Laterality: Right;  Right Below Knee Amputation    Family History  Problem Relation Age of Onset  . Pneumonia Mother   . Heart disease Father   . Diabetes Father   . Colon cancer Neg Hx     Social History:  reports that he has never smoked. He has never used smokeless tobacco. He reports that he does not drink alcohol or use illicit drugs.  Allergies:  Allergies  Allergen Reactions  . Morphine Rash and Other (See Comments)    Irritability also    No prescriptions prior to admission    No results found for this or any previous visit (from the past 48 hour(s)). No results found.  Review of Systems  All other systems reviewed and are negative.    There were no vitals taken for this visit. Physical Exam  On examination patient has ischemic gangrenous changes of the right transtibial amputation Assessment/Plan Assessment: Ischemic gangrenous dehiscence of the right transtibial amputation.  Plan: We'll plan for revision amputation risks and benefits were discussed patient states he understands was pursued this time.  [name] V 02/14/2014, 6:55 AM

## 2014-02-14 NOTE — Anesthesia Procedure Notes (Signed)
Procedure Name: LMA Insertion Date/Time: 02/14/2014 3:18 PM Performed by: Manuela Schwartz B Pre-anesthesia Checklist: Patient identified, Emergency Drugs available, Suction available, Patient being monitored and Timeout performed Patient Re-evaluated:Patient Re-evaluated prior to inductionOxygen Delivery Method: Circle system utilized Preoxygenation: Pre-oxygenation with 100% oxygen Intubation Type: IV induction LMA: LMA inserted LMA Size: 4.0 Placement Confirmation: positive ETCO2 and breath sounds checked- equal and bilateral Tube secured with: Tape Dental Injury: Teeth and Oropharynx as per pre-operative assessment

## 2014-02-14 NOTE — Anesthesia Postprocedure Evaluation (Signed)
Anesthesia Post Note  Patient: [name]  Procedure(s) Performed: Procedure(s) (LRB): AMPUTATION BELOW KNEE (Right)  Anesthesia type: General  Patient location: PACU  Post pain: Pain level controlled and Adequate analgesia  Post assessment: Post-op Vital signs reviewed, Patient's Cardiovascular Status Stable, Respiratory Function Stable, Patent Airway and Pain level controlled  Last Vitals:  Filed Vitals:   02/14/14 1645  BP: 164/64  Pulse: 87  Temp:   Resp: 17    Post vital signs: Reviewed and stable  Level of consciousness: awake, alert  and oriented  Complications: No apparent anesthesia complications

## 2014-02-14 NOTE — Op Note (Signed)
OPERATIVE REPORT  DATE OF SURGERY: 02/14/2014  PATIENT:  [name],  79 y.o. male  PRE-OPERATIVE DIAGNOSIS:  Dehiscence Right Below Knee Amputation  POST-OPERATIVE DIAGNOSIS:  Dehiscence Right Below Knee Amputation  PROCEDURE:  Procedure(s): AMPUTATION BELOW KNEE revision  SURGEON:  Surgeon(s): [name], MD  ANESTHESIA:   general  EBL:  min ML  SPECIMEN:  No Specimen  TOURNIQUET:  * No tourniquets in log *  PROCEDURE DETAILS: Patient is a 79 year old gentleman with dehiscence of his right transtibial amputation. Patient presents at this time for revision. Risk and benefits were discussed including infection neurovascular injury nonhealing of the wound need for additional surgery. Patient states he understands and wished to proceed at this time. Description of procedure patient was brought to the operating room and underwent a general anesthetic. After adequate levels of anesthesia were obtained patient's right lower extremity was prepped using Betadine paint and draped into a sterile field. A fishmouth incision was made around the necrotic skin the tibia and fibular were resected proximally 3 cm proximal the bone and soft tissue were resected in one block of tissue. The vascular bundles were suture ligated with 2-0 silk the wound was irrigated there was healthy muscle after debridement. Skin soft tissue and muscle were excised as well as bone was excised. An incision was closed using 2-0 nylon and staples. The wound was covered Adaptic orthopedic sponges ABDs dressing Kerlix and Coban. Patient was extubated taken to the PACU in stable condition plan for discharge to home  PLAN OF CARE: Discharge to home after PACU  PATIENT DISPOSITION:  PACU - hemodynamically stable.   [name], MD 02/14/2014 3:56 PM

## 2014-02-14 NOTE — Progress Notes (Signed)
Subjective:     Patient ID: [name], male   DOB: [birth year], 79 y.o.   MRN: [id number]  HPI patient is found to have nail disease 1-5 left and has lost his right leg do to infection and vascular disease   Review of Systems     Objective:   Physical Exam Neurovascular status unchanged with thick nailbeds 1-5 left that are brittle    Assessment:     Mycotic nail infection 1-5 left    Plan:     Debridement nailbeds 1-5 left with no bleeding noted

## 2014-02-14 NOTE — Progress Notes (Signed)
Pt is OOB, dressed, and sitting in his personal wheelchair, watching TV and waiting for PTAR for transport back to [facility], pt drinking diet coke, eating crackers, pain tolerable, anxious waiting for PTAR. Will cont to monitor until he leaves

## 2014-02-14 NOTE — Discharge Instructions (Signed)
Dressing clean and dry    What to eat:  For your first meals, you should eat lightly; only small meals initially.  If you do not have nausea, you may eat larger meals.  Avoid spicy, greasy and heavy food.    General Anesthesia, Adult, Care After  Refer to this sheet in the next few weeks. These instructions provide you with information on caring for yourself after your procedure. Your health care provider may also give you more specific instructions. Your treatment has been planned according to current medical practices, but problems sometimes occur. Call your health care provider if you have any problems or questions after your procedure.  WHAT TO EXPECT AFTER THE PROCEDURE  After the procedure, it is typical to experience:  Sleepiness.  Nausea and vomiting. HOME CARE INSTRUCTIONS  For the first 24 hours after general anesthesia:  Have a responsible person with you.  Do not drive a car. If you are alone, do not take public transportation.  Do not drink alcohol.  Do not take medicine that has not been prescribed by your health care provider.  Do not sign important papers or make important decisions.  You may resume a normal diet and activities as directed by your health care provider.  Change bandages (dressings) as directed.  If you have questions or problems that seem related to general anesthesia, call the hospital and ask for the anesthetist or anesthesiologist on call. SEEK MEDICAL CARE IF:  You have nausea and vomiting that continue the day after anesthesia.  You develop a rash. SEEK IMMEDIATE MEDICAL CARE IF:  You have difficulty breathing.  You have chest pain.  You have any allergic problems. Document Released: 02/13/2001 Document Revised: 07/10/2013 Document Reviewed: 05/23/2013  [name] Patient Information 2014 [place], [place].

## 2014-02-17 ENCOUNTER — Ambulatory Visit: Payer: Medicare Other | Admitting: Internal Medicine

## 2014-02-17 NOTE — Telephone Encounter (Signed)
Attempted to call nurse but unable to reach her.  No result for A1c.

## 2014-02-18 ENCOUNTER — Non-Acute Institutional Stay: Payer: Medicare Other | Admitting: Internal Medicine

## 2014-02-18 ENCOUNTER — Encounter (HOSPITAL_COMMUNITY): Payer: Self-pay | Admitting: Orthopedic Surgery

## 2014-02-18 ENCOUNTER — Encounter: Payer: Self-pay | Admitting: Infectious Disease

## 2014-02-18 ENCOUNTER — Other Ambulatory Visit: Payer: Self-pay | Admitting: *Deleted

## 2014-02-18 ENCOUNTER — Ambulatory Visit (INDEPENDENT_AMBULATORY_CARE_PROVIDER_SITE_OTHER): Payer: Medicare Other | Admitting: Infectious Disease

## 2014-02-18 VITALS — BP 130/70 | HR 71 | Temp 98.4°F | Wt 162.0 lb

## 2014-02-18 DIAGNOSIS — L989 Disorder of the skin and subcutaneous tissue, unspecified: Secondary | ICD-10-CM

## 2014-02-18 DIAGNOSIS — Z950 Presence of cardiac pacemaker: Secondary | ICD-10-CM

## 2014-02-18 DIAGNOSIS — B961 Klebsiella pneumoniae [K. pneumoniae] as the cause of diseases classified elsewhere: Secondary | ICD-10-CM

## 2014-02-18 DIAGNOSIS — I10 Essential (primary) hypertension: Secondary | ICD-10-CM

## 2014-02-18 DIAGNOSIS — T8740 Infection of amputation stump, unspecified extremity: Secondary | ICD-10-CM

## 2014-02-18 DIAGNOSIS — Z89511 Acquired absence of right leg below knee: Secondary | ICD-10-CM

## 2014-02-18 DIAGNOSIS — B951 Streptococcus, group B, as the cause of diseases classified elsewhere: Secondary | ICD-10-CM

## 2014-02-18 DIAGNOSIS — M869 Osteomyelitis, unspecified: Secondary | ICD-10-CM

## 2014-02-18 DIAGNOSIS — K5900 Constipation, unspecified: Secondary | ICD-10-CM | POA: Insufficient documentation

## 2014-02-18 DIAGNOSIS — G4700 Insomnia, unspecified: Secondary | ICD-10-CM | POA: Insufficient documentation

## 2014-02-18 DIAGNOSIS — A491 Streptococcal infection, unspecified site: Secondary | ICD-10-CM

## 2014-02-18 DIAGNOSIS — A498 Other bacterial infections of unspecified site: Secondary | ICD-10-CM

## 2014-02-18 DIAGNOSIS — I4891 Unspecified atrial fibrillation: Secondary | ICD-10-CM

## 2014-02-18 DIAGNOSIS — E1159 Type 2 diabetes mellitus with other circulatory complications: Secondary | ICD-10-CM

## 2014-02-18 DIAGNOSIS — M9080 Osteopathy in diseases classified elsewhere, unspecified site: Secondary | ICD-10-CM

## 2014-02-18 DIAGNOSIS — E1169 Type 2 diabetes mellitus with other specified complication: Secondary | ICD-10-CM

## 2014-02-18 DIAGNOSIS — T879 Unspecified complications of amputation stump: Secondary | ICD-10-CM

## 2014-02-18 NOTE — Assessment & Plan Note (Signed)
S/p AKA early 01/2014 then re-infection with Klebsiella On Doxy and Cephalexin Will Follow with ID and Dr. Duda  Continue PT/OT Will change Oxycodone 10 mg 6 prn to q6 hours sch; will change Hydrocodone-acetaminophen q6 hours prn to q 4 hours prn  

## 2014-02-18 NOTE — Assessment & Plan Note (Signed)
Continue SSI and Insulin 30 units  Monitor cbgs

## 2014-02-18 NOTE — Assessment & Plan Note (Signed)
Controlled continue Lisinopril 10 mg and Lasix 40 mg  Monitor BP

## 2014-02-18 NOTE — Assessment & Plan Note (Signed)
S/p AKA early 01/2014 then re-infection with Klebsiella On Doxy and Cephalexin Will Follow with ID and Dr. Sharol Given  Continue PT/OT Will change Oxycodone 10 mg 6 prn to q6 hours sch; will change Hydrocodone-acetaminophen q6 hours prn to q 4 hours prn

## 2014-02-18 NOTE — Progress Notes (Signed)
Subjective:    Patient ID: [name], male    DOB: Apr 07, 1935, 79 y.o.   MRN: [id number]  HPI   79 year-old patient with past medical history significant for diabetes mellitus, CHF, atrial fibrillation on chronic anticoagulation, syncope with tachybradycardia syndrome  status post pacemaker placement who was referred to [place] by Dr. Arnoldo Morale and Dr. 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  He stated he's had a wound on his heel for approximately half a year but is worsened in the last 6-8 weeks. He's been followed closely by Dr. Dellia Nims in the wound care clinic his prescribed him doxycycline and more recently ciprofloxacin after recovering Pseudomonas from a deep tissue culture.  Pseudomonas was sensitive to all antibiotics tested including ciprofloxacin and levofloxacin with MIC the left less than equal 0.25 and 0.5 also sensitive to Zosyn imipenem Ceftaz and cefepime gentamicin and tobramycin.  Patient had undergone a CT scan of the heel which showed:  Osteolysis and active osteomyelitis of the calcaneus.  He states that ciprofloxacin is improved his wound but the wound is now getting bigger after debridement from wound care and treatment with the hyperbaric chamber.   When I first saw him I had cautioned that he would need BKA to cure his chronic deep bone infection but that we could pursue bone biopsy followed by targetted IV abx followed by indefinite oral abx. I had referred him to Orthopedics but he ended up being admitted to [facility] for worsening infection, foul odor erythema from around the wound.   Dr. Migdalia Dk took him to the OR and did a bone biopsy for culture which yielded abundant GBS.   The following day however he decided he had better proceed with curative BKA which Dr. Sharol Given performed. She was seen by my partners in ID and abx were stopped within 72 hours post surgery.  He has been dc to [facility]. Unfortunately he appears to have  developed a soft tissue infection involving his stump with purulence at the site and Dr Sharol Given started him on doxy--though I do not see it in the [name].  He has also noticed a new erytyhematous lesion on big toe on the left side and darkening of his skin there.   [name]  I saw him cultured the area of purulence and added keflex and he grew a Klebsiella PNA that was S to this abx.  Since then the would has worsened in my opinion with worsening dehiscence and purulence and one of the staples loose today. He saw Dr. Sharol Given  Who took him to the OR for I and D of stump infection on  02/14/14. No new cultures available in Epic. He has continued on doxy and keflex and feels much better. He continues to reside in SNF   Review of Systems  Constitutional: Negative for fever, chills, diaphoresis, appetite change and fatigue.  Eyes: Negative for photophobia and visual disturbance.  Respiratory: Negative for cough, chest tightness, shortness of breath and wheezing.   Cardiovascular: Negative for chest pain and palpitations.  Gastrointestinal: Negative for nausea, vomiting and diarrhea.  Musculoskeletal: Positive for arthralgias. Negative for back pain and myalgias.  Skin: Positive for wound. Negative for color change, pallor and rash.  Neurological: Negative for dizziness, tremors, weakness and light-headedness.  Hematological: Bruises/bleeds easily.  Psychiatric/Behavioral: Negative for behavioral problems, confusion, sleep disturbance, dysphoric mood, decreased concentration and agitation.       Objective:   Physical Exam  Constitutional: He is oriented to  person, place, and time. He appears well-developed and well-nourished. No distress.  HENT:  Head: Normocephalic and atraumatic.  Mouth/Throat: Oropharynx is clear and moist. No oropharyngeal exudate.  Eyes: Conjunctivae and EOM are normal. No scleral icterus.  Neck: Normal range of motion. Neck supple.  Cardiovascular: Normal rate and normal  heart sounds.  An irregularly irregular rhythm present. Exam reveals no friction rub.   No murmur heard. Pulmonary/Chest: Effort normal and breath sounds normal. No respiratory distress. He has no wheezes. He has no rales. He exhibits no tenderness.    Abdominal: Soft. Bowel sounds are normal. He exhibits no distension. There is no tenderness. There is no rebound.  Musculoskeletal: He exhibits no edema and no tenderness.       Legs: Neurological: He is alert and oriented to person, place, and time. He exhibits normal muscle tone. Coordination normal.  Skin: Skin is warm and dry. He is not diaphoretic. There is erythema. No pallor.  Psychiatric: He has a normal mood and affect. His behavior is normal. Judgment and thought content normal.   Right BKA s/p I and D on 02/14/14:                       Assessment & Plan:    Stump infection:  Sp I and D  --continue doxy and keflex  --follow closely with Dr. Sharol Given and in SNF --I will plan on seeing him in 2 weeks (he already has appt)  I spent greater than 25 minutes with the patient including greater than 50% of time in face to face counsel of the patient and in coordination of their care.   Diabetic osteomyelitis of the calcaneus: cured with BKA, GBS was culprit ID on deep culture   Left leg with new erythematous area: area is concerning for embolic lesion ? Due to PVD vs endocarditis? He had normal ABI's in January.  I discussed with Cardiology and they have TTE scheduled, will keep an eye on this area

## 2014-02-18 NOTE — Assessment & Plan Note (Signed)
Will try Melatonin 5 mg qhs if it does not work consider Ambien 5 mg

## 2014-02-18 NOTE — Progress Notes (Addendum)
   Subjective:    Patient ID: [name], male    DOB: September 10, 1935, 79 y.o.   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   1) He is currently at [facility] since the beginning of 01/2014 s/p right AKA.  ON 02/14/14 he went back for debridement with Dr. Sharol Given for infection with wound culture +Klebsiella on 3/19 on wound culture and Group B strep on previous tissue culture.  His pain is not controlled with the current Oxycodone 10 mg q6 hours prn and Hydrocodone-Acetaminophen 5-325 mg q6 hours prn.  Per the RN staff he is asking for the medications at 10 am and 2 pm and other random times when he is requesting the medication.  He has anxiety about the site getting infected again and states he knows when the infection occurred after surgery.  His pain is 5/10 currently and max goes to 9/10.  Pain has been worse since the recent debridement.  He is on chronic Oxycodone for chronic back pain for 10 years s/p surgery.  He is on Cephelexin and Doxycycline currently  2) He also complains of constipation.  He had a bowel movement 2 days ago which was firm and hard.    3) He is also having trouble sleeping at night due to noise and requesting something for sleep.  He has previously taken Melatonin and Ambien both which helped.    4) The RN is stating that the patient needs scheduled Ativan as well possibly.                 Review of Systems    Gastrointestinal: Positive for constipation.  Musculoskeletal: Positive for arthralgias.  Psychiatric/Behavioral: Positive for sleep disturbance.       Objective:   Physical Exam  Nursing note and vitals reviewed. Constitutional: He is oriented to person, place, and time. Vital signs are normal. He appears well-developed and well-nourished. He is cooperative.  HENT:  Head: Normocephalic and atraumatic.  Mouth/Throat: Oropharynx is clear and moist and mucous membranes are normal. No oropharyngeal exudate.  Eyes: Conjunctivae are normal. Right eye exhibits no discharge. Left eye exhibits no discharge. No scleral icterus.  Cardiovascular: Normal rate, S1 normal and S2 normal.  An irregularly irregular rhythm present.  No murmur heard. Pulmonary/Chest: Effort normal and breath sounds normal.  Musculoskeletal: He exhibits no edema.  Neurological: He is alert and oriented to person, place, and time.  Skin: Skin is warm and dry. No rash noted.  Right AKA site with drainage (serosanguinous dried to dressing and sheets)   Psychiatric: He has a normal mood and affect. His speech is normal and behavior is normal. Judgment and thought content normal. Cognition and memory are normal.          Assessment & Plan:  Discusses with Dr. [name] who agrees with the plan

## 2014-02-18 NOTE — Assessment & Plan Note (Signed)
Will add Miralax

## 2014-02-19 NOTE — Progress Notes (Signed)
I agree with the assessment and plan as documented by Dr. Aundra Dubin.  Mr. Grandstaff oxycodone was scheduled and the frequency of his as needed hydrocodone increased to help his pain control.  He will continue his antibiotics as per ID recommendations.

## 2014-03-01 ENCOUNTER — Encounter (HOSPITAL_COMMUNITY): Payer: Self-pay | Admitting: Emergency Medicine

## 2014-03-01 ENCOUNTER — Inpatient Hospital Stay (HOSPITAL_COMMUNITY)
Admission: EM | Admit: 2014-03-01 | Discharge: 2014-03-03 | DRG: 812 | Disposition: A | Discharge status: Skilled Nursing Facility | Payer: Medicare Other | Attending: Internal Medicine | Admitting: Internal Medicine

## 2014-03-01 DIAGNOSIS — Z89511 Acquired absence of right leg below knee: Secondary | ICD-10-CM

## 2014-03-01 DIAGNOSIS — I872 Venous insufficiency (chronic) (peripheral): Secondary | ICD-10-CM | POA: Diagnosis present

## 2014-03-01 DIAGNOSIS — K5900 Constipation, unspecified: Secondary | ICD-10-CM | POA: Diagnosis present

## 2014-03-01 DIAGNOSIS — T879 Unspecified complications of amputation stump: Secondary | ICD-10-CM

## 2014-03-01 DIAGNOSIS — Z888 Allergy status to other drugs, medicaments and biological substances status: Secondary | ICD-10-CM

## 2014-03-01 DIAGNOSIS — I495 Sick sinus syndrome: Secondary | ICD-10-CM | POA: Diagnosis present

## 2014-03-01 DIAGNOSIS — K222 Esophageal obstruction: Secondary | ICD-10-CM | POA: Diagnosis present

## 2014-03-01 DIAGNOSIS — I10 Essential (primary) hypertension: Secondary | ICD-10-CM | POA: Diagnosis present

## 2014-03-01 DIAGNOSIS — D62 Acute posthemorrhagic anemia: Secondary | ICD-10-CM

## 2014-03-01 DIAGNOSIS — Z66 Do not resuscitate: Secondary | ICD-10-CM | POA: Diagnosis present

## 2014-03-01 DIAGNOSIS — I509 Heart failure, unspecified: Secondary | ICD-10-CM | POA: Diagnosis present

## 2014-03-01 DIAGNOSIS — K219 Gastro-esophageal reflux disease without esophagitis: Secondary | ICD-10-CM | POA: Diagnosis present

## 2014-03-01 DIAGNOSIS — Z7901 Long term (current) use of anticoagulants: Secondary | ICD-10-CM

## 2014-03-01 DIAGNOSIS — Z950 Presence of cardiac pacemaker: Secondary | ICD-10-CM | POA: Diagnosis present

## 2014-03-01 DIAGNOSIS — E46 Unspecified protein-calorie malnutrition: Secondary | ICD-10-CM

## 2014-03-01 DIAGNOSIS — I4510 Unspecified right bundle-branch block: Secondary | ICD-10-CM | POA: Diagnosis present

## 2014-03-01 DIAGNOSIS — M869 Osteomyelitis, unspecified: Secondary | ICD-10-CM | POA: Diagnosis present

## 2014-03-01 DIAGNOSIS — M549 Dorsalgia, unspecified: Secondary | ICD-10-CM | POA: Diagnosis present

## 2014-03-01 DIAGNOSIS — I4891 Unspecified atrial fibrillation: Secondary | ICD-10-CM | POA: Diagnosis present

## 2014-03-01 DIAGNOSIS — Y835 Amputation of limb(s) as the cause of abnormal reaction of the patient, or of later complication, without mention of misadventure at the time of the procedure: Secondary | ICD-10-CM | POA: Diagnosis present

## 2014-03-01 DIAGNOSIS — E440 Moderate protein-calorie malnutrition: Secondary | ICD-10-CM | POA: Diagnosis present

## 2014-03-01 DIAGNOSIS — T148XXA Other injury of unspecified body region, initial encounter: Secondary | ICD-10-CM

## 2014-03-01 DIAGNOSIS — Z794 Long term (current) use of insulin: Secondary | ICD-10-CM

## 2014-03-01 DIAGNOSIS — S88119A Complete traumatic amputation at level between knee and ankle, unspecified lower leg, initial encounter: Secondary | ICD-10-CM

## 2014-03-01 DIAGNOSIS — I5032 Chronic diastolic (congestive) heart failure: Secondary | ICD-10-CM | POA: Diagnosis present

## 2014-03-01 DIAGNOSIS — G8929 Other chronic pain: Secondary | ICD-10-CM | POA: Diagnosis present

## 2014-03-01 DIAGNOSIS — T8740 Infection of amputation stump, unspecified extremity: Secondary | ICD-10-CM | POA: Diagnosis present

## 2014-03-01 DIAGNOSIS — M109 Gout, unspecified: Secondary | ICD-10-CM | POA: Diagnosis present

## 2014-03-01 DIAGNOSIS — Z981 Arthrodesis status: Secondary | ICD-10-CM

## 2014-03-01 DIAGNOSIS — D649 Anemia, unspecified: Secondary | ICD-10-CM

## 2014-03-01 DIAGNOSIS — E119 Type 2 diabetes mellitus without complications: Secondary | ICD-10-CM

## 2014-03-01 LAB — CBC WITH DIFFERENTIAL/PLATELET
Basophils Absolute: 0 10*3/uL (ref 0.0–0.1)
Basophils Relative: 1 % (ref 0–1)
Eosinophils Absolute: 0.2 10*3/uL (ref 0.0–0.7)
Eosinophils Relative: 3 % (ref 0–5)
HCT: 20.5 % — ABNORMAL LOW (ref 39.0–52.0)
HEMOGLOBIN: 6.4 g/dL — AB (ref 13.0–17.0)
LYMPHS ABS: 2.5 10*3/uL (ref 0.7–4.0)
Lymphocytes Relative: 29 % (ref 12–46)
MCH: 25.8 pg — AB (ref 26.0–34.0)
MCHC: 31.2 g/dL (ref 30.0–36.0)
MCV: 82.7 fL (ref 78.0–100.0)
MONOS PCT: 7 % (ref 3–12)
Monocytes Absolute: 0.6 10*3/uL (ref 0.1–1.0)
NEUTROS PCT: 60 % (ref 43–77)
Neutro Abs: 5.1 10*3/uL (ref 1.7–7.7)
Platelets: 275 10*3/uL (ref 150–400)
RBC: 2.48 MIL/uL — AB (ref 4.22–5.81)
RDW: 15.9 % — ABNORMAL HIGH (ref 11.5–15.5)
WBC: 8.5 10*3/uL (ref 4.0–10.5)

## 2014-03-01 LAB — BASIC METABOLIC PANEL
BUN: 37 mg/dL — ABNORMAL HIGH (ref 6–23)
CALCIUM: 9.2 mg/dL (ref 8.4–10.5)
CO2: 24 mEq/L (ref 19–32)
CREATININE: 1.38 mg/dL — AB (ref 0.50–1.35)
Chloride: 98 mEq/L (ref 96–112)
GFR calc non Af Amer: 48 mL/min — ABNORMAL LOW (ref >90)
GFR, EST AFRICAN AMERICAN: 56 mL/min — AB (ref >90)
Glucose, Bld: 157 mg/dL — ABNORMAL HIGH (ref 70–99)
Potassium: 4.5 mEq/L (ref 3.7–5.3)
Sodium: 136 mEq/L — ABNORMAL LOW (ref 137–147)

## 2014-03-01 LAB — APTT: aPTT: 42 seconds — ABNORMAL HIGH (ref 24–37)

## 2014-03-01 LAB — PROTIME-INR
INR: 1.66 — AB (ref 0.00–1.49)
PROTHROMBIN TIME: 19.1 s — AB (ref 11.6–15.2)

## 2014-03-01 MED ORDER — ONDANSETRON HCL 4 MG/2ML IJ SOLN: 4.0000 mg | Freq: Four times a day (QID) | INTRAMUSCULAR | Status: DC | PRN

## 2014-03-01 MED ORDER — INSULIN GLARGINE 100 UNIT/ML SOLOSTAR PEN: 15.0000 [IU] | PEN_INJECTOR | Freq: Every day | SUBCUTANEOUS | Status: DC

## 2014-03-01 MED ORDER — FUROSEMIDE 20 MG PO TABS
40.0000 mg | ORAL_TABLET | Freq: Every day | ORAL | Status: DC
  Administered 2014-03-03: 40 mg via ORAL
  Filled 2014-03-01: qty 2

## 2014-03-01 MED ORDER — OXYCODONE HCL 5 MG PO TABS
10.0000 mg | ORAL_TABLET | Freq: Four times a day (QID) | ORAL | Status: DC | PRN
  Administered 2014-03-02 – 2014-03-03 (×5): 10 mg via ORAL
  Filled 2014-03-01 (×5): qty 2

## 2014-03-01 MED ORDER — MELATONIN 5 MG PO TABS
5.0000 mg | ORAL_TABLET | Freq: Every evening | ORAL | Status: DC | PRN
Start: 2014-03-01 — End: 2014-03-01

## 2014-03-01 MED ORDER — PANTOPRAZOLE SODIUM 40 MG PO TBEC
40.0000 mg | DELAYED_RELEASE_TABLET | Freq: Every day | ORAL | Status: DC
  Administered 2014-03-02 – 2014-03-03 (×2): 40 mg via ORAL
  Filled 2014-03-01 (×2): qty 1

## 2014-03-01 MED ORDER — DIGOXIN 125 MCG PO TABS
0.1250 mg | ORAL_TABLET | Freq: Every day | ORAL | Status: DC
  Administered 2014-03-02 – 2014-03-03 (×2): 0.125 mg via ORAL
  Filled 2014-03-01 (×2): qty 1

## 2014-03-01 MED ORDER — SODIUM CHLORIDE 0.9 % IV SOLN
INTRAVENOUS | Status: DC
Start: 2014-03-02 — End: 2014-03-02
  Administered 2014-03-02: 01:00:00 via INTRAVENOUS

## 2014-03-01 MED ORDER — DILTIAZEM HCL ER COATED BEADS 180 MG PO CP24
180.0000 mg | ORAL_CAPSULE | Freq: Every day | ORAL | Status: DC
  Administered 2014-03-02 – 2014-03-03 (×2): 180 mg via ORAL
  Filled 2014-03-01 (×2): qty 1

## 2014-03-01 MED ORDER — INSULIN ASPART 100 UNIT/ML ~~LOC~~ SOLN: 0.0000 [IU] | Freq: Every day | SUBCUTANEOUS | Status: DC

## 2014-03-01 MED ORDER — INSULIN GLARGINE 100 UNIT/ML ~~LOC~~ SOLN
15.0000 [IU] | Freq: Every day | SUBCUTANEOUS | Status: DC
  Administered 2014-03-02 (×2): 15 [IU] via SUBCUTANEOUS
  Filled 2014-03-01 (×3): qty 0.15

## 2014-03-01 MED ORDER — INSULIN ASPART 100 UNIT/ML ~~LOC~~ SOLN
0.0000 [IU] | Freq: Three times a day (TID) | SUBCUTANEOUS | Status: DC
  Administered 2014-03-02: 1 [IU] via SUBCUTANEOUS
  Administered 2014-03-02 – 2014-03-03 (×2): 2 [IU] via SUBCUTANEOUS

## 2014-03-01 MED ORDER — ACETAMINOPHEN 650 MG RE SUPP: 650.0000 mg | Freq: Four times a day (QID) | RECTAL | Status: DC | PRN

## 2014-03-01 MED ORDER — HYDROCODONE-ACETAMINOPHEN 5-325 MG PO TABS
1.0000 | ORAL_TABLET | ORAL | Status: DC | PRN
Start: 2014-03-01 — End: 2014-03-03
  Administered 2014-03-03 (×2): 1 via ORAL
  Filled 2014-03-01 (×2): qty 1

## 2014-03-01 MED ORDER — ACETAMINOPHEN 325 MG PO TABS: 650.0000 mg | ORAL_TABLET | Freq: Four times a day (QID) | ORAL | Status: DC | PRN

## 2014-03-01 MED ORDER — POLYETHYLENE GLYCOL 3350 17 G PO PACK
17.0000 g | PACK | Freq: Every day | ORAL | Status: DC
  Administered 2014-03-02: 17 g via ORAL
  Filled 2014-03-01 (×3): qty 1

## 2014-03-01 MED ORDER — DOCUSATE SODIUM 100 MG PO CAPS
100.0000 mg | ORAL_CAPSULE | Freq: Two times a day (BID) | ORAL | Status: DC
  Administered 2014-03-02 – 2014-03-03 (×3): 100 mg via ORAL
  Filled 2014-03-01 (×5): qty 1

## 2014-03-01 MED ORDER — SODIUM CHLORIDE 0.9 % IJ SOLN
3.0000 mL | Freq: Two times a day (BID) | INTRAMUSCULAR | Status: DC
  Administered 2014-03-02 (×3): 3 mL via INTRAVENOUS

## 2014-03-01 MED ORDER — CEPHALEXIN 250 MG PO CAPS
500.0000 mg | ORAL_CAPSULE | Freq: Four times a day (QID) | ORAL | Status: DC
  Administered 2014-03-02 – 2014-03-03 (×7): 500 mg via ORAL
  Filled 2014-03-01 (×9): qty 2

## 2014-03-01 MED ORDER — ONDANSETRON HCL 4 MG PO TABS: 4.0000 mg | ORAL_TABLET | Freq: Four times a day (QID) | ORAL | Status: DC | PRN

## 2014-03-01 MED ORDER — LORAZEPAM 0.5 MG PO TABS
0.2500 mg | ORAL_TABLET | Freq: Two times a day (BID) | ORAL | Status: DC | PRN
  Administered 2014-03-02: 0.25 mg via ORAL
  Filled 2014-03-01: qty 1

## 2014-03-01 MED ORDER — DOXYCYCLINE HYCLATE 100 MG PO TABS
100.0000 mg | ORAL_TABLET | Freq: Two times a day (BID) | ORAL | Status: DC
  Administered 2014-03-02 – 2014-03-03 (×4): 100 mg via ORAL
  Filled 2014-03-01 (×5): qty 1

## 2014-03-01 NOTE — ED Notes (Signed)
Pt had Oxycodone at nursing home around 1830- reports he is in no pain at this time.

## 2014-03-01 NOTE — Progress Notes (Signed)
PHARMACIST - PHYSICIAN ORDER COMMUNICATION  CONCERNING: P&T Medication Policy on Herbal Medications  DESCRIPTION:  This patient's order for:  melatonin 5mg    has been noted.  This product(s) is classified as an "herbal" or natural product. Due to a lack of definitive safety studies or FDA approval, nonstandard manufacturing practices, plus the potential risk of unknown drug-drug interactions while on inpatient medications, the Pharmacy and Therapeutics Committee does not permit the use of "herbal" or natural products of this type within [facility].   ACTION TAKEN: The pharmacy department is unable to verify this order at this time and your patient has been informed of this safety policy. Please reevaluate patient's clinical condition at discharge and address if the herbal or natural product(s) should be resumed at that time.

## 2014-03-01 NOTE — ED Notes (Signed)
Pt to ED from [facility] home- pt had right below knee amputation 2 weeks ago, staff reports pt has been having bleeding from site throughout the day and they cannot get it to stop- dressing intact upon arrival to ED.  Pt alert upon arrival to ED.

## 2014-03-01 NOTE — H&P (Signed)
[facility] History and Physical  Patient: [name]  YQI:[id number]  DOB: 03/18/1935  DOS: the patient was seen and examined on 03/01/2014 PCP: Georgetta Haber, MD  Chief Complaint: Bleeding from the stump  HPI: [name] is a 79 y.o. male with Past medical history of diabetes mellitus, diabetes osteomyelitis, GERD, RBBB, hypertension, A. fib,. The patient presented with complaints of bleeding from his right BKA stump. He mentions when he sleeps last night he was at his baseline there was no bleeding the wound was stable. Earlier last week he had a visit with Dr. Sharol Given who performed stitches removal for him. When he woke up in the morning he found himself in a lot of blood. Throughout the day the nursing home staff attempted to stop the bleeding but really was not stopping. The patient continued to take his Eliquis with that. Since the patient was becoming weak and lethargic and symptomatic at that the patient was sent to the ER for further workup. In the ER the ED physician cauterized the wound and the bleeding stopped. After that hospitalist was asked to admit the patient for observation and continue blood transfusion. The patient denies any bleeding from anywhere else no blood in his urine most likely her bowel movements, no abdominal pain, no fever no chills no chest pain no shortness of breath no dizziness no nausea no vomiting no rash. He mentions that his wound has been healing well since he has been placed on Keflex and since he has seen infectious disease Dr last time. He continues to complain of constipation.  The patient is coming from skilled nursing facility. And at her baseline dependent for most of his  ADL.  Review of Systems: as mentioned in the history of present illness.  A Comprehensive review of the other systems is negative.  Past Medical History  Diagnosis Date  . Fatigue   . Overweight   . Syncope and collapse   . GERD (gastroesophageal  reflux disease)   . Right bundle branch block   . Unspecified venous (peripheral) insufficiency   . Phlebitis and thrombophlebitis of superficial vessels of lower extremities   . Cellulitis and abscess of leg, except foot   . Backache, unspecified   . Long term (current) use of anticoagulants   . Type II or unspecified type diabetes mellitus without mention of complication, not stated as uncontrolled   . Unspecified venous (peripheral) insufficiency   . Hypertension   . Gout, unspecified   . Heart failure   . Atrial fibrillation     permanent  . Esophageal stricture   . Hx of colonic polyps 05-2006    (Adenomatous)Dr. [name]  . Diverticulosis 05-2006    Dr. [name]   . Tachycardia-bradycardia     s/p PPM  . Pacemaker- [name] 02/20/2013  . CHF (congestive heart failure)    Past Surgical History  Procedure Laterality Date  . Laminectomy    . Lumbar fusion    . Rotator cuff repair    . Ruptured rt rectus muscle    . Eye surgery    . Pacemaker insertion  8/12    SJM by [name] for tachy/brady syndrome  . I&d extremity Right 01/17/2014    Procedure: IRRIGATION AND DEBRIDEMENT RIGHT HEEL  WITH CULTURES AND BONE BIOPSY, placement of wound vac;  Surgeon: Theodoro Kos, DO;  Location: [place];  Service: Plastics;  Laterality: Right;  . Amputation Right 01/20/2014    Procedure: AMPUTATION BELOW KNEE- right;  Surgeon: [name], MD;  Location: [place];  Service: Orthopedics;  Laterality: Right;  Right Below Knee Amputation  . Amputation Right 02/14/2014    Procedure: AMPUTATION BELOW KNEE;  Surgeon: [name], MD;  Location: [place];  Service: Orthopedics;  Laterality: Right;  Right Below Knee Amputation Revision   Social History:  reports that he has never smoked. He has never used smokeless tobacco. He reports that he does not drink alcohol or use illicit drugs.  Allergies  Allergen Reactions  . Morphine Rash and Other (See Comments)    Irritability also    Family History   Problem Relation Age of Onset  . Pneumonia Mother   . Heart disease Father   . Diabetes Father   . Colon cancer Neg Hx     Prior to Admission medications   Medication Sig Start Date End Date Taking? Authorizing Provider  apixaban (ELIQUIS) 5 MG TABS tablet Take 1 tablet (5 mg total) by mouth 2 (two) times daily. 04/04/13  Yes Ricard Dillon, MD  cephALEXin (KEFLEX) 500 MG capsule Take 1 capsule (500 mg total) by mouth 4 (four) times daily. 02/06/14  Yes Truman Hayward, MD  digoxin (LANOXIN) 0.125 MG tablet Take 0.125 mg by mouth daily.   Yes Historical Provider, MD  diltiazem (CARDIZEM CD) 180 MG 24 hr capsule Take 1 capsule (180 mg total) by mouth daily. 10/01/13  Yes Ricard Dillon, MD  doxycycline (VIBRA-TABS) 100 MG tablet Take 100 mg by mouth 2 (two) times daily. 30 day course started 02/05/2014   Yes Historical Provider, MD  furosemide (LASIX) 40 MG tablet Take 1 tablet (40 mg total) by mouth daily. 01/24/14  Yes Verlee Monte, MD  HYDROcodone-acetaminophen (NORCO/VICODIN) 5-325 MG per tablet Take 1 tablet by mouth every 4 (four) hours as needed for moderate pain.   Yes Historical Provider, MD  insulin aspart (NOVOLOG FLEXPEN) 100 UNIT/ML FlexPen Inject 0-10 Units into the skin 3 (three) times daily with meals. Per sliding scale:  cbg 150-250 5 units, 251-300 8 units, 301-500 10 units (call MD if over 350)   Yes Historical Provider, MD  Insulin Glargine (LANTUS SOLOSTAR) 100 UNIT/ML Solostar Pen Inject 30 Units into the skin at bedtime.   Yes Historical Provider, MD  lisinopril (PRINIVIL,ZESTRIL) 10 MG tablet Take 10 mg by mouth daily. 01/24/14  Yes Verlee Monte, MD  LORazepam (ATIVAN) 0.5 MG tablet Take 0.25 mg by mouth every 12 (twelve) hours as needed for anxiety.   Yes Historical Provider, MD  Melatonin 5 MG TABS Take 5 mg by mouth at bedtime as needed (insomnia).   Yes Historical Provider, MD  Multiple Vitamins-Minerals (DECUBI-VITE) CAPS Take 1 capsule by mouth daily. With meal   Yes  Historical Provider, MD  Nutritional Supplements (NUTRITIONAL SUPPLEMENT PO) Take 60 mLs by mouth 3 (three) times daily. 2 calorie supplement   Yes Historical Provider, MD  omeprazole (PRILOSEC) 20 MG capsule Take 20 mg by mouth 2 (two) times daily before a meal.   Yes Historical Provider, MD  Oxycodone HCl 10 MG TABS Take 10 mg by mouth every 6 (six) hours as needed (pain).    Yes Historical Provider, MD  Ostomy Supplies (SKIN PREP WIPES) MISC Apply 1 application topically daily. Apply to left gr toe for wound care    Historical Provider, MD    Physical Exam: Filed Vitals:   03/01/14 2224 03/01/14 2245 03/01/14 2300 03/01/14 2354  BP: 123/41 110/51 118/44 124/47  Pulse:  67 71 74  Temp:    98.7 F (37.1 C)  TempSrc:    Oral  Resp: 18 17 15 18   Height:    5\' 10"  (1.778 m)  Weight:    75.5 kg (166 lb 7.2 oz)  SpO2: 99% 100% 100% 96%    General: Alert, Awake and Oriented to Time, Place and Person. Appear in mild distress Eyes: PERRL ENT: Oral Mucosa clear moist. Neck: no JVD Cardiovascular: S1 and S2 Present, no Murmur, Peripheral Pulses Present Respiratory: Bilateral Air entry equal and Decreased, Clear to Auscultation,  no Crackles,no wheezes Abdomen: Bowel Sound Present, Soft and Non tender Skin: no Rash Extremities: no Pedal edema, no calf tenderness on the left, right foot BKA, stump was wrapped,  As per ED physician the lateral aspect of the stump has stitches removed and may have dehisced, the medial aspect still has stitches intact.  Neurologic: Grossly Unremarkable.  Labs on Admission:  CBC:  Recent Labs Lab 03/01/14 2123  WBC 8.5  NEUTROABS 5.1  HGB 6.4*  HCT 20.5*  MCV 82.7  PLT 275    CMP     Component Value Date/Time   NA 136* 03/01/2014 2123   K 4.5 03/01/2014 2123   CL 98 03/01/2014 2123   CO2 24 03/01/2014 2123   GLUCOSE 157* 03/01/2014 2123   GLUCOSE 358* 10/26/2006 1158   BUN 37* 03/01/2014 2123   CREATININE 1.38* 03/01/2014 2123   CALCIUM 9.2  03/01/2014 2123   PROT 7.7 02/14/2014 1323   ALBUMIN 2.8* 02/14/2014 1323   AST 15 02/14/2014 1323   ALT 10 02/14/2014 1323   ALKPHOS 125* 02/14/2014 1323   BILITOT 0.5 02/14/2014 1323   GFRNONAA 48* 03/01/2014 2123   GFRAA 56* 03/01/2014 2123    No results found for this basename: LIPASE, AMYLASE,  in the last 168 hours No results found for this basename: AMMONIA,  in the last 168 hours  No results found for this basename: CKTOTAL, CKMB, CKMBINDEX, TROPONINI,  in the last 168 hours BNP (last 3 results)  Recent Labs  01/16/14 2043  PROBNP 3148.0*    Radiological Exams on Admission: No results found.   Assessment/Plan Principal Problem:   Acute blood loss anemia Active Problems:   HYPERTENSION   Atrial fibrillation   Chronic diastolic CHF (congestive heart failure)   GERD   BACK PAIN, CHRONIC   Pacemaker- [name]   Hx of right BKA   Amputation stump infection   Diabetes mellitus   1. Acute blood loss anemia The patient is presenting with acute blood loss anemia with symptoms of shortness of breath and dizziness. At the time of my evaluation the patient's wound has been cauterized and dressed. Orthopedics has been consulted for patient who will be following the patient in the morning. Patient denies any bleeding anywhere else. Patient is on Eliquis. At present the patient will be receiving one unit of PRBC, I would follow his CBC after the transfusion and if that does not improve his hemoglobin count more than 7 or his symptoms, then he may require further workup as well as further transfusion  2. Atrial fibrillation The patient appears in sinus rhythm at present I would continue his digoxin and metoprolol and hold his Eliquis  3. Chronic diastolic dysfunction At present holding his Lasix which can be resumed after blood transfusion  4. Chronic back pain Continue pain medications as per prior regimen  5. Palpitations infection Continue Keflex and doxycycline as  outpatient prescriptions.  6. Diabetes mellitus At present at a lower dose of insulin Lantus and place him on sliding scale, from tomorrow he can resume his regular dose of insulin Lantus   Consults: Orthopedics  DVT Prophylaxis: mechanical compression device Nutrition: Diabetic diet liquid diet  Code Status: DNR/DNI  Disposition: Admitted to inpatient in telemetry unit.  Author: Berle Mull, MD [facility] Pager: [phone] 03/01/2014, 11:56 PM    If 7PM-7AM, please contact night-coverage [email or web address] Password [email or web address]

## 2014-03-01 NOTE — ED Provider Notes (Signed)
CSN: [id number]     Arrival date & time 03/01/14  2016 History   First MD Initiated Contact with Patient 03/01/14 2021     Chief Complaint  Patient presents with  . Post-op Problem     (Consider location/radiation/quality/duration/timing/severity/associated sxs/prior Treatment) The history is provided by the patient.  [name] is a 79 y.o. male hx of GERD, DM, recent BKA here with bleeding from surgical site. He had a recent BKA and was placed on eliquis. He is at a nursing home. He was noted to have bleeding from the site today. It soaked through 4 pads and is still bleeding. Denies chest pain or passing out.    Past Medical History  Diagnosis Date  . Fatigue   . Overweight   . Syncope and collapse   . GERD (gastroesophageal reflux disease)   . Right bundle branch block   . Unspecified venous (peripheral) insufficiency   . Phlebitis and thrombophlebitis of superficial vessels of lower extremities   . Cellulitis and abscess of leg, except foot   . Backache, unspecified   . Long term (current) use of anticoagulants   . Type II or unspecified type diabetes mellitus without mention of complication, not stated as uncontrolled   . Unspecified venous (peripheral) insufficiency   . Hypertension   . Gout, unspecified   . Heart failure   . Atrial fibrillation     permanent  . Esophageal stricture   . Hx of colonic polyps 05-2006    (Adenomatous)Dr. [name]  . Diverticulosis 05-2006    Dr. [name]   . Tachycardia-bradycardia     s/p PPM  . Pacemaker- [name] 02/20/2013  . CHF (congestive heart failure)    Past Surgical History  Procedure Laterality Date  . Laminectomy    . Lumbar fusion    . Rotator cuff repair    . Ruptured rt rectus muscle    . Eye surgery    . Pacemaker insertion  8/12    SJM by [name] for tachy/brady syndrome  . I&d extremity Right 01/17/2014    Procedure: IRRIGATION AND DEBRIDEMENT RIGHT HEEL  WITH CULTURES AND BONE BIOPSY, placement of wound  vac;  Surgeon: Theodoro Kos, DO;  Location: [place];  Service: Plastics;  Laterality: Right;  . Amputation Right 01/20/2014    Procedure: AMPUTATION BELOW KNEE- right;  Surgeon: [name], MD;  Location: [place];  Service: Orthopedics;  Laterality: Right;  Right Below Knee Amputation  . Amputation Right 02/14/2014    Procedure: AMPUTATION BELOW KNEE;  Surgeon: [name], MD;  Location: [place];  Service: Orthopedics;  Laterality: Right;  Right Below Knee Amputation Revision   Family History  Problem Relation Age of Onset  . Pneumonia Mother   . Heart disease Father   . Diabetes Father   . Colon cancer Neg Hx    History  Substance Use Topics  . Smoking status: Never Smoker   . Smokeless tobacco: Never Used  . Alcohol Use: No     Comment: two drinks daily - not while at [facility]  Skin: Positive for wound.  All other systems reviewed and are negative.     Allergies  Morphine  Home Medications   Current Outpatient Rx  Name  Route  Sig  Dispense  Refill  . apixaban (ELIQUIS) 5 MG TABS tablet   Oral   Take 1 tablet (5 mg total) by mouth 2 (two) times daily.  60 tablet   11   . cephALEXin (KEFLEX) 500 MG capsule   Oral   Take 1 capsule (500 mg total) by mouth 4 (four) times daily.   120 capsule   1   . digoxin (LANOXIN) 0.125 MG tablet   Oral   Take 0.125 mg by mouth daily.         Marland Kitchen diltiazem (CARDIZEM CD) 180 MG 24 hr capsule   Oral   Take 1 capsule (180 mg total) by mouth daily.   30 capsule   6   . doxycycline (VIBRA-TABS) 100 MG tablet   Oral   Take 100 mg by mouth 2 (two) times daily. 30 day course started 02/05/2014         . furosemide (LASIX) 40 MG tablet   Oral   Take 1 tablet (40 mg total) by mouth daily.         Marland Kitchen HYDROcodone-acetaminophen (NORCO/VICODIN) 5-325 MG per tablet   Oral   Take 1 tablet by mouth every 4 (four) hours as needed for moderate pain.         Marland Kitchen insulin aspart (NOVOLOG FLEXPEN) 100 UNIT/ML  FlexPen   Subcutaneous   Inject 0-10 Units into the skin 3 (three) times daily with meals. Per sliding scale:  cbg 150-250 5 units, 251-300 8 units, 301-500 10 units (call MD if over 350)         . Insulin Glargine (LANTUS SOLOSTAR) 100 UNIT/ML Solostar Pen   Subcutaneous   Inject 30 Units into the skin at bedtime.         Marland Kitchen lisinopril (PRINIVIL,ZESTRIL) 10 MG tablet   Oral   Take 10 mg by mouth daily.         Marland Kitchen LORazepam (ATIVAN) 0.5 MG tablet   Oral   Take 0.25 mg by mouth every 12 (twelve) hours as needed for anxiety.         . Melatonin 5 MG TABS   Oral   Take 5 mg by mouth at bedtime as needed (insomnia).         . Multiple Vitamins-Minerals (DECUBI-VITE) CAPS   Oral   Take 1 capsule by mouth daily. With meal         . Nutritional Supplements (NUTRITIONAL SUPPLEMENT PO)   Oral   Take 60 mLs by mouth 3 (three) times daily. 2 calorie supplement         . omeprazole (PRILOSEC) 20 MG capsule   Oral   Take 20 mg by mouth 2 (two) times daily before a meal.         . Oxycodone HCl 10 MG TABS   Oral   Take 10 mg by mouth every 6 (six) hours as needed (pain).          Oneta Rack Supplies (SKIN PREP WIPES) MISC   Topical   Apply 1 application topically daily. Apply to left gr toe for wound care          BP 118/49  Pulse 74  Temp(Src) 97.7 F (36.5 C) (Oral)  Resp 18  SpO2 100% Physical Exam  Nursing note and vitals reviewed. Constitutional: He is oriented to person, place, and time.  Chronically ill   HENT:  Head: Normocephalic.  Mouth/Throat: Oropharynx is clear and moist.  Eyes: Conjunctivae and EOM are normal. Pupils are equal, round, and reactive to light.  Neck: Normal range of motion.  Cardiovascular: Normal rate.   Pulmonary/Chest: Effort normal.  Abdominal: Soft. Bowel sounds are  normal.  Musculoskeletal:  R BKA. On the lateral aspect, there is exposed subcutaneous tissue with minimal bleeding.   Neurological: He is alert and oriented  to person, place, and time.  Skin: Skin is warm and dry.  Psychiatric: He has a normal mood and affect. His behavior is normal. Judgment and thought content normal.    ED Course  Procedures (including critical care time)  Wound dressing Surgical wound bleeding. I applied silver nitrate to active bleeding site. I applied surgicel and ABD and compressive dressing. Bleeding stopped. No complications.    CRITICAL CARE Performed by: Wandra Arthurs   Total critical care time: 30 min  Critical care time was exclusive of separately billable procedures and treating other patients.  Critical care was necessary to treat or prevent imminent or life-threatening deterioration.  Critical care was time spent personally by me on the following activities: development of treatment plan with patient and/or surrogate as well as nursing, discussions with consultants, evaluation of patient's response to treatment, examination of patient, obtaining history from patient or surrogate, ordering and performing treatments and interventions, ordering and review of laboratory studies, ordering and review of radiographic studies, pulse oximetry and re-evaluation of patient's condition.  Labs Review Labs Reviewed  CBC WITH DIFFERENTIAL - Abnormal; Notable for the following:    RBC 2.48 (*)    Hemoglobin 6.4 (*)    HCT 20.5 (*)    MCH 25.8 (*)    RDW 15.9 (*)    All other components within normal limits  BASIC METABOLIC PANEL - Abnormal; Notable for the following:    Sodium 136 (*)    Glucose, Bld 157 (*)    BUN 37 (*)    Creatinine, Ser 1.38 (*)    GFR calc non Af Amer 48 (*)    GFR calc Af Amer 56 (*)    All other components within normal limits  TYPE AND SCREEN  PREPARE RBC (CROSSMATCH)   Imaging Review No results found.   EKG Interpretation None      MDM   Final diagnoses:  None    [name] is a 79 y.o. male here with recent BKA here with bleeding from surgical site. I cauterized  bleeding area and applied surgicel and ABD and compressive dressing. Bleeding stopped.   10:12 PM Patient anemic to 6.4. Will transfuse. Will admit overnight. No further bleeding. Ortho will follow patient.   Wandra Arthurs, MD 03/01/14 2227

## 2014-03-01 NOTE — ED Notes (Signed)
Lab called to add on additional blood work. 

## 2014-03-02 DIAGNOSIS — K219 Gastro-esophageal reflux disease without esophagitis: Secondary | ICD-10-CM

## 2014-03-02 DIAGNOSIS — T8740 Infection of amputation stump, unspecified extremity: Secondary | ICD-10-CM

## 2014-03-02 DIAGNOSIS — D62 Acute posthemorrhagic anemia: Principal | ICD-10-CM

## 2014-03-02 DIAGNOSIS — E119 Type 2 diabetes mellitus without complications: Secondary | ICD-10-CM | POA: Diagnosis present

## 2014-03-02 DIAGNOSIS — S88119A Complete traumatic amputation at level between knee and ankle, unspecified lower leg, initial encounter: Secondary | ICD-10-CM

## 2014-03-02 DIAGNOSIS — I4891 Unspecified atrial fibrillation: Secondary | ICD-10-CM

## 2014-03-02 LAB — GLUCOSE, CAPILLARY
GLUCOSE-CAPILLARY: 138 mg/dL — AB (ref 70–99)
GLUCOSE-CAPILLARY: 160 mg/dL — AB (ref 70–99)
Glucose-Capillary: 134 mg/dL — ABNORMAL HIGH (ref 70–99)
Glucose-Capillary: 116 mg/dL — ABNORMAL HIGH (ref 70–99)
Glucose-Capillary: 150 mg/dL — ABNORMAL HIGH (ref 70–99)

## 2014-03-02 LAB — COMPREHENSIVE METABOLIC PANEL
ALT: 7 U/L (ref 0–53)
AST: 12 U/L (ref 0–37)
Albumin: 2.6 g/dL — ABNORMAL LOW (ref 3.5–5.2)
Alkaline Phosphatase: 95 U/L (ref 39–117)
BUN: 34 mg/dL — AB (ref 6–23)
CO2: 24 meq/L (ref 19–32)
CREATININE: 1.28 mg/dL (ref 0.50–1.35)
Calcium: 8.9 mg/dL (ref 8.4–10.5)
Chloride: 99 mEq/L (ref 96–112)
GFR calc Af Amer: 61 mL/min — ABNORMAL LOW (ref >90)
GFR, EST NON AFRICAN AMERICAN: 53 mL/min — AB (ref >90)
GLUCOSE: 122 mg/dL — AB (ref 70–99)
POTASSIUM: 4.3 meq/L (ref 3.7–5.3)
Sodium: 138 mEq/L (ref 137–147)
Total Bilirubin: 0.9 mg/dL (ref 0.3–1.2)
Total Protein: 6.4 g/dL (ref 6.0–8.3)

## 2014-03-02 LAB — PREPARE RBC (CROSSMATCH)

## 2014-03-02 LAB — CBC
HEMATOCRIT: 21.9 % — AB (ref 39.0–52.0)
HEMOGLOBIN: 7.1 g/dL — AB (ref 13.0–17.0)
MCH: 26.9 pg (ref 26.0–34.0)
MCHC: 32.4 g/dL (ref 30.0–36.0)
MCV: 83 fL (ref 78.0–100.0)
Platelets: 219 10*3/uL (ref 150–400)
RBC: 2.64 MIL/uL — AB (ref 4.22–5.81)
RDW: 16.1 % — AB (ref 11.5–15.5)
WBC: 6 10*3/uL (ref 4.0–10.5)

## 2014-03-02 LAB — PROTIME-INR
INR: 1.53 — AB (ref 0.00–1.49)
PROTHROMBIN TIME: 18 s — AB (ref 11.6–15.2)

## 2014-03-02 MED ORDER — FERROUS FUMARATE 325 (106 FE) MG PO TABS
1.0000 | ORAL_TABLET | Freq: Every day | ORAL | Status: DC
  Filled 2014-03-02: qty 1

## 2014-03-02 MED ORDER — GLUCERNA SHAKE PO LIQD
237.0000 mL | Freq: Two times a day (BID) | ORAL | Status: DC
  Administered 2014-03-03: 237 mL via ORAL

## 2014-03-02 MED ORDER — FUROSEMIDE 10 MG/ML IJ SOLN
20.0000 mg | Freq: Once | INTRAMUSCULAR | Status: AC
  Administered 2014-03-02: 20 mg via INTRAVENOUS

## 2014-03-02 MED ORDER — FUROSEMIDE 10 MG/ML IJ SOLN
INTRAMUSCULAR | Status: AC
  Filled 2014-03-02: qty 4

## 2014-03-02 NOTE — Progress Notes (Signed)
[facility] PROGRESS NOTE  [name] KDT:[id number] DOB: [birth year] DOA: 03/01/2014 PCP: Georgetta Haber, MD  Assessment/Plan: Symptomatic anemia due Acute blood loss HYPERTENSION Atrial fibrillation (on eliquis) Chronic diastolic CHF (congestive heart failure) GERD BACK PAIN, CHRONIC Pacemaker- St Jude Hx of right BKA Amputation stump infection Diabetes mellitus  1-acute blood loss anemia: due to bleeding from BKA. -bleeding stopped. -after 1 unit of PRBC Hgb 7.1; -will transfuse 1 more unit and follow Hgb trend -lasix to be given after transfusion to prevent fluid overload.  2-atrial fibrillation. Rate controlled.  -continue holding eliquis one more day due to recent bleeding and anemia -will resume in am if stable -continue digoxin and diltiazem  3-chronic diastolic dysfunction: compensated at this moment. -follow daily weight -strict I's and O's -will resume home lasix  4-GERD: continue PPI  5-Moderate protein calorie malnutrition and recent amputation: will continue glucerna BID  6-chronic back pain: continue PRN pain meds  7-stump infection: continue antibiotics as prescribed prior to admission and follow with ID doctor as instructed  8-DM: continue lantus and SSI.  Code Status: DNR Family Communication: no family at bedside Disposition Plan: to be discharge to SNF when medically stable   Consultants:  Orthopedic service   Procedures:  None   Antibiotics:  None   HPI/Subjective: Afebrile, feeling better and w/o acute complaints  Objective: Filed Vitals:   03/02/14 1347  BP: 142/56  Pulse: 68  Temp: 98.3 F (36.8 C)  Resp: 18    Intake/Output Summary (Last 24 hours) at 03/02/14 1402 Last data filed at 03/02/14 1232  Gross per 24 hour  Intake 889.67 ml  Output    795 ml  Net  94.67 ml   Filed Weights   03/01/14 2354 03/02/14 0500 03/02/14 0524  Weight: 75.5 kg (166 lb 7.2 oz) 74.9 kg (165 lb 2 oz) 73.1 kg (161 lb  2.5 oz)    Exam:   General:  NAD, feeling better; afebrile and currently denying any CP or SOB  Cardiovascular: regular rate, no rubs or gallops  Respiratory: CTA bilaterally  Abdomen: soft, NT, ND, positive BS  Musculoskeletal: right BKA, dressing intact and w/o signs of bleeding or suppuration  Data Reviewed: Basic Metabolic Panel:  Recent Labs Lab 03/01/14 2123 03/02/14 0710  NA 136* 138  K 4.5 4.3  CL 98 99  CO2 24 24  GLUCOSE 157* 122*  BUN 37* 34*  CREATININE 1.38* 1.28  CALCIUM 9.2 8.9   Liver Function Tests:  Recent Labs Lab 03/02/14 0710  AST 12  ALT 7  ALKPHOS 95  BILITOT 0.9  PROT 6.4  ALBUMIN 2.6*   CBC:  Recent Labs Lab 03/01/14 2123 03/02/14 0710  WBC 8.5 6.0  NEUTROABS 5.1  --   HGB 6.4* 7.1*  HCT 20.5* 21.9*  MCV 82.7 83.0  PLT 275 219   BNP (last 3 results)  Recent Labs  01/16/14 2043  PROBNP 3148.0*   CBG:  Recent Labs Lab 03/02/14 03/02/14 0605 03/02/14 1101  GLUCAP 138* 134* 160*    Studies: No results found.  Scheduled Meds: . cephALEXin  500 mg Oral QID  . digoxin  0.125 mg Oral Daily  . diltiazem  180 mg Oral Daily  . docusate sodium  100 mg Oral BID  . doxycycline  100 mg Oral BID  . furosemide  20 mg Intravenous Once  . [START ON 03/03/2014] furosemide  40 mg Oral Daily  . insulin aspart  0-5 Units Subcutaneous QHS  . insulin  aspart  0-9 Units Subcutaneous TID WC  . insulin glargine  15 Units Subcutaneous QHS  . pantoprazole  40 mg Oral Daily  . polyethylene glycol  17 g Oral Daily  . sodium chloride  3 mL Intravenous Q12H   Continuous Infusions:    Time spent: >30 minutes   Barton Dubois  [facility] Pager [phone]. If 7PM-7AM, please contact night-coverage at [email or web address], password [facility] 03/02/2014, 2:02 PM  LOS: 1 day

## 2014-03-02 NOTE — Progress Notes (Signed)
Subjective:     Patient reports pain as mild.    Objective: Vital signs in last 24 hours: Temp:  [97.3 F (36.3 C)-98.7 F (37.1 C)] 98.6 F (37 C) (04/12 1446) Pulse Rate:  [63-77] 63 (04/12 1446) Resp:  [15-20] 18 (04/12 1446) BP: (110-145)/(41-63) 135/46 mmHg (04/12 1446) SpO2:  [96 %-100 %] 100 % (04/12 1446) Weight:  [73.1 kg (161 lb 2.5 oz)-75.5 kg (166 lb 7.2 oz)] 73.1 kg (161 lb 2.5 oz) (04/12 0524)  Intake/Output from previous day: 04/11 0701 - 04/12 0700 In: 874.2 [P.O.:120; I.V.:391.7; Blood:362.5] Out: 575 [Urine:575] Intake/Output this shift: Total I/O In: 255.5 [P.O.:240; I.V.:3; Blood:12.5] Out: 420 [Urine:420]   Recent Labs  03/01/14 2123 03/02/14 0710  HGB 6.4* 7.1*    Recent Labs  03/01/14 2123 03/02/14 0710  WBC 8.5 6.0  RBC 2.48* 2.64*  HCT 20.5* 21.9*  PLT 275 219    Recent Labs  03/01/14 2123 03/02/14 0710  NA 136* 138  K 4.5 4.3  CL 98 99  CO2 24 24  BUN 37* 34*  CREATININE 1.38* 1.28  GLUCOSE 157* 122*  CALCIUM 9.2 8.9    Recent Labs  03/01/14 2123 03/02/14 0710  INR 1.66* 1.53*    stump[  dressing dry no bleeding . had some bleeding and had blood loss anemia and is getting transfused.   Assessment/Plan:     Discharge to SNF after blood and hemodynamically stable. Agree with recheck Hgb in AM  Marybelle Killings 03/02/2014, 3:14 PM

## 2014-03-02 NOTE — Progress Notes (Signed)
Patient alert and oriented x4 throughout shift.  Vital signs stable.  Wife at bedside this afternoon.  One unit of RBCs transfused without incident.  Patient expecting to be discharged tomorrow, MD aware.  Will continue to monitor. itor.

## 2014-03-03 DIAGNOSIS — I509 Heart failure, unspecified: Secondary | ICD-10-CM

## 2014-03-03 DIAGNOSIS — D649 Anemia, unspecified: Secondary | ICD-10-CM

## 2014-03-03 DIAGNOSIS — E46 Unspecified protein-calorie malnutrition: Secondary | ICD-10-CM

## 2014-03-03 DIAGNOSIS — I5032 Chronic diastolic (congestive) heart failure: Secondary | ICD-10-CM

## 2014-03-03 LAB — GLUCOSE, CAPILLARY
GLUCOSE-CAPILLARY: 162 mg/dL — AB (ref 70–99)
Glucose-Capillary: 104 mg/dL — ABNORMAL HIGH (ref 70–99)

## 2014-03-03 LAB — CBC
HEMATOCRIT: 27.5 % — AB (ref 39.0–52.0)
Hemoglobin: 9 g/dL — ABNORMAL LOW (ref 13.0–17.0)
MCH: 26.7 pg (ref 26.0–34.0)
MCHC: 32.7 g/dL (ref 30.0–36.0)
MCV: 81.6 fL (ref 78.0–100.0)
Platelets: 225 10*3/uL (ref 150–400)
RBC: 3.37 MIL/uL — ABNORMAL LOW (ref 4.22–5.81)
RDW: 15.5 % (ref 11.5–15.5)
WBC: 7.9 10*3/uL (ref 4.0–10.5)

## 2014-03-03 MED ORDER — GLUCERNA SHAKE PO LIQD: 237.0000 mL | Freq: Two times a day (BID) | ORAL | Status: DC

## 2014-03-03 MED ORDER — LORAZEPAM 0.5 MG PO TABS: 0.2500 mg | ORAL_TABLET | Freq: Two times a day (BID) | ORAL | Status: DC | PRN

## 2014-03-03 MED ORDER — OXYCODONE HCL 10 MG PO TABS: 10.0000 mg | ORAL_TABLET | Freq: Four times a day (QID) | ORAL | Status: DC | PRN

## 2014-03-03 MED ORDER — FERROUS FUMARATE 325 (106 FE) MG PO TABS: 1.0000 | ORAL_TABLET | Freq: Every day | ORAL | Status: DC

## 2014-03-03 MED ORDER — INSULIN GLARGINE 100 UNIT/ML SOLOSTAR PEN: 20.0000 [IU] | PEN_INJECTOR | Freq: Every day | SUBCUTANEOUS | Status: DC

## 2014-03-03 NOTE — Progress Notes (Signed)
UR completed Renesmee Raine K. Cherryl Babin, RN, BSN, [place], CCM  03/03/2014 12:46 PM

## 2014-03-03 NOTE — Care Management Note (Addendum)
  Page 1 of 1   03/04/2014     11:00:51 AM   CARE MANAGEMENT NOTE 03/04/2014  Patient:  [name], [name]   Account Number:  [id number]  Date Initiated:  03/03/2014  Documentation initiated by:  [place]  Subjective/Objective Assessment:   Admitted with anemia 6.4, hx/o recent BKA, stump infection     Action/Plan:   CM to follow for dispositon needs   Anticipated DC Date:  03/03/2014   Anticipated DC Plan:  SKILLED NURSING FACILITY         Choice offered to / List presented to:             Status of service:  Completed, signed off Medicare Important Message given?   (If response is "NO", the following Medicare IM given date fields will be blank) Date Medicare IM given:   Date Additional Medicare IM given:    Discharge Disposition:  [place]  Per UR Regulation:  Reviewed for med. necessity/level of care/duration of stay  If discussed at [place] of Stay Meetings, dates discussed:    Comments:  03/03/2014 D/c back to SNF - [facility] [place], BSN, [place], [place] 03/03/2014

## 2014-03-03 NOTE — Progress Notes (Signed)
Called [facility] and gave report to Daphene Jaeger, LPN. Will continue to monitor pt closely until he is discharged.

## 2014-03-03 NOTE — Discharge Summary (Signed)
Physician Discharge Summary  [name] DAUGHDRILL XIP:[id number] DOB: 1934/12/04 DOA: 03/01/2014  PCP: Georgetta Haber, MD  Admit date: 03/01/2014 Discharge date: 03/03/2014  Time spent: >30 minutes  Recommendations for Outpatient Follow-up:  1. Reassess CBC in 5 days to follow Hgb trend 2. Wound care instructions and follow up as per orthopedic service 3. Please check DM and CBG's for further adjustments to hypoglycemic regimen. 4. BMET to follow electrolytes and renal function  Discharge Diagnoses:  Principal Problem:   Acute blood loss anemia Active Problems:   HYPERTENSION   Atrial fibrillation   Chronic diastolic CHF (congestive heart failure)   GERD   BACK PAIN, CHRONIC   Pacemaker- [name]   Hx of right BKA   Amputation stump infection   Diabetes mellitus   Discharge Condition: stable and improved. Will discharge to SNF.  Diet recommendation: low sodium diet and low carbohydrates diet  Filed Weights   03/02/14 0500 03/02/14 0524 03/03/14 0500  Weight: 74.9 kg (165 lb 2 oz) 73.1 kg (161 lb 2.5 oz) 73.4 kg (161 lb 13.1 oz)    History of present illness:  79 y.o. male hx of GERD, DM, recent BKA here with bleeding from surgical site. He had a recent BKA and was placed on eliquis. He is at a nursing home. He was noted to have bleeding from the site on day of admission. It soaked through 4 pads and is still bleeding. Denies chest pain, SOB or passing out.    Hospital Course:  1-acute blood loss anemia: due to bleeding from BKA.  -bleeding stopped.  -after 1 unit of PRBC Hgb 9.0 -patient stable and ready to be discharge back to SNF. -close follow up as an outpatient of his wound by orthopedic service.  2-atrial fibrillation. Rate controlled.  -continue digoxin and diltiazem  -resume apixaban as anticoagulation.  3-chronic diastolic dysfunction: compensated at this moment.  -follow daily weight  -will resume home dose lasix   4-GERD: continue PPI   5-Moderate  protein calorie malnutrition and recent amputation: will continue glucerna BID and prostat TID.   6-chronic back pain and stump pain: continue PRN pain meds; stable and overall well controlled.   7-stump infection: continue antibiotics as prescribed prior to admission and follow with ID doctor as instructed   8-DM: continue lantus and SSI.  *Rest of his medical problems remains stable and the plan is to continue current medication regimen and to follow with PCP for further adjustments on medications as needed  Procedures:  None   Consultations:  Orthopedic service  Discharge Exam: Filed Vitals:   03/03/14 0920  BP: 144/50  Pulse: 74  Temp: 98.5 F (36.9 C)  Resp: 18   General: NAD, feeling better; afebrile and currently denying any CP or SOB  Cardiovascular: regular rate, no rubs or gallops  Respiratory: CTA bilaterally  Abdomen: soft, NT, ND, positive BS  Musculoskeletal: right BKA, dressing intact and w/o signs of acute bleeding or suppuration at discharge. Clean dressings in place.   Discharge Instructions You were cared for by a hospitalist during your hospital stay. If you have any questions about your discharge medications or the care you received while you were in the hospital after you are discharged, you can call the unit and asked to speak with the hospitalist on call if the hospitalist that took care of you is not available. Once you are discharged, your primary care physician will handle any further medical issues. Please note that NO REFILLS for any discharge  medications will be authorized once you are discharged, as it is imperative that you return to your primary care physician (or establish a relationship with a primary care physician if you do not have one) for your aftercare needs so that they can reassess your need for medications and monitor your lab values.  Discharge Orders   Future Appointments Provider Department Dept Phone   03/04/2014 11:15 [place], MD [facility] [phone]   03/05/2014 9:30 AM [facility] [address] [id number]   03/21/2014 3:00 PM Deboraha Sprang, MD [place] Office [phone]   05/15/2014 2:15 PM Wallene Huh, [place] at [place]   Future Orders Complete By Expires   Diet - low sodium heart healthy  As directed    Discharge instructions  As directed        Medication List    STOP taking these medications       HYDROcodone-acetaminophen 5-325 MG per tablet  Commonly known as:  NORCO/VICODIN      TAKE these medications       apixaban 5 MG Tabs tablet  Commonly known as:  ELIQUIS  Take 1 tablet (5 mg total) by mouth 2 (two) times daily.     cephALEXin 500 MG capsule  Commonly known as:  KEFLEX  Take 1 capsule (500 mg total) by mouth 4 (four) times daily.     DECUBI-VITE Caps  Take 1 capsule by mouth daily. With meal     digoxin 0.125 MG tablet  Commonly known as:  LANOXIN  Take 0.125 mg by mouth daily.     diltiazem 180 MG 24 hr capsule  Commonly known as:  CARDIZEM CD  Take 1 capsule (180 mg total) by mouth daily.     doxycycline 100 MG tablet  Commonly known as:  VIBRA-TABS  Take 100 mg by mouth 2 (two) times daily. 30 day course started 02/05/2014     ferrous fumarate 325 (106 FE) MG Tabs tablet  Commonly known as:  HEMOCYTE - 106 mg FE  Take 1 tablet (106 mg of iron total) by mouth daily at 12 noon.     furosemide 40 MG tablet  Commonly known as:  LASIX  Take 1 tablet (40 mg total) by mouth daily.     Insulin Glargine 100 UNIT/ML Solostar Pen  Commonly known as:  LANTUS SOLOSTAR  Inject 20 Units into the skin at bedtime.     lisinopril 10 MG tablet  Commonly known as:  PRINIVIL,ZESTRIL  Take 10 mg by mouth daily.     LORazepam 0.5 MG tablet  Commonly known as:  ATIVAN  Take 0.5 tablets (0.25 mg total) by mouth every 12 (twelve) hours as needed for  anxiety.     Melatonin 5 MG Tabs  Take 5 mg by mouth at bedtime as needed (insomnia).     NOVOLOG FLEXPEN 100 UNIT/ML FlexPen  Generic drug:  insulin aspart  Inject 0-10 Units into the skin 3 (three) times daily with meals. Per sliding scale:  cbg 150-250 5 units, 251-300 8 units, 301-500 10 units (call MD if over 350)     NUTRITIONAL SUPPLEMENT PO  Take 60 mLs by mouth 3 (three) times daily. 2 calorie supplement     feeding supplement (GLUCERNA SHAKE) Liqd  Take 237 mLs by mouth 2 (two) times daily between meals.     omeprazole 20 MG  capsule  Commonly known as:  PRILOSEC  Take 20 mg by mouth 2 (two) times daily before a meal.     Oxycodone HCl 10 MG Tabs  Take 1 tablet (10 mg total) by mouth every 6 (six) hours as needed (pain).     SKIN PREP WIPES Misc  Apply 1 application topically daily. Apply to left gr toe for wound care       Allergies  Allergen Reactions  . Morphine Rash and Other (See Comments)    Irritability also       Follow-up Information   Call Alcide Evener, MD. (office to confirmed appointment)    Specialty:  Infectious Diseases   Contact information:   [address] [place] [address] [phone]       Follow up with [name], MD. (call office to confirmed appointment details)    Specialty:  Orthopedic Surgery   Contact information:   [place] [address] [phone]        The results of significant diagnostics from this hospitalization (including imaging, microbiology, ancillary and laboratory) are listed below for reference.     Labs: Basic Metabolic Panel:  Recent Labs Lab 03/01/14 2123 03/02/14 0710  NA 136* 138  K 4.5 4.3  CL 98 99  CO2 24 24  GLUCOSE 157* 122*  BUN 37* 34*  CREATININE 1.38* 1.28  CALCIUM 9.2 8.9   Liver Function Tests:  Recent Labs Lab 03/02/14 0710  AST 12  ALT 7  ALKPHOS 95  BILITOT 0.9  PROT 6.4  ALBUMIN 2.6*   CBC:  Recent  Labs Lab 03/01/14 2123 03/02/14 0710 03/03/14 0620  WBC 8.5 6.0 7.9  NEUTROABS 5.1  --   --   HGB 6.4* 7.1* 9.0*  HCT 20.5* 21.9* 27.5*  MCV 82.7 83.0 81.6  PLT 275 219 225   BNP (last 3 results)  Recent Labs  01/16/14 2043  PROBNP 3148.0*   CBG:  Recent Labs Lab 03/02/14 1101 03/02/14 1623 03/02/14 2145 03/03/14 0557 03/03/14 1114  GLUCAP 160* 116* 150* 104* 162*    Signed:  Barton Dubois  [facility] 03/03/2014, 11:46 AM

## 2014-03-04 ENCOUNTER — Ambulatory Visit (INDEPENDENT_AMBULATORY_CARE_PROVIDER_SITE_OTHER): Payer: Medicare Other | Admitting: Infectious Disease

## 2014-03-04 ENCOUNTER — Other Ambulatory Visit (HOSPITAL_COMMUNITY): Payer: Medicare Other

## 2014-03-04 ENCOUNTER — Encounter: Payer: Self-pay | Admitting: Infectious Disease

## 2014-03-04 VITALS — BP 148/74 | HR 70 | Temp 98.2°F

## 2014-03-04 DIAGNOSIS — B961 Klebsiella pneumoniae [K. pneumoniae] as the cause of diseases classified elsewhere: Secondary | ICD-10-CM

## 2014-03-04 DIAGNOSIS — IMO0001 Reserved for inherently not codable concepts without codable children: Secondary | ICD-10-CM

## 2014-03-04 DIAGNOSIS — B951 Streptococcus, group B, as the cause of diseases classified elsewhere: Secondary | ICD-10-CM

## 2014-03-04 DIAGNOSIS — I739 Peripheral vascular disease, unspecified: Secondary | ICD-10-CM

## 2014-03-04 DIAGNOSIS — M869 Osteomyelitis, unspecified: Secondary | ICD-10-CM

## 2014-03-04 DIAGNOSIS — A491 Streptococcal infection, unspecified site: Secondary | ICD-10-CM

## 2014-03-04 DIAGNOSIS — IMO0002 Reserved for concepts with insufficient information to code with codable children: Secondary | ICD-10-CM

## 2014-03-04 DIAGNOSIS — A498 Other bacterial infections of unspecified site: Secondary | ICD-10-CM

## 2014-03-04 DIAGNOSIS — T8740 Infection of amputation stump, unspecified extremity: Secondary | ICD-10-CM

## 2014-03-04 LAB — TYPE AND SCREEN
ABO/RH(D): B POS
Antibody Screen: POSITIVE
DAT, IgG: NEGATIVE
UNIT DIVISION: 0
Unit division: 0

## 2014-03-04 NOTE — Progress Notes (Signed)
Subjective:    Patient ID: [name], male    DOB: 1935-10-17, 79 y.o.   MRN: [id number]  HPI   79 year-old patient with past medical history significant for diabetes mellitus, CHF, atrial fibrillation on chronic anticoagulation, syncope with tachybradycardia syndrome  status post pacemaker placement who was referred to [place] by Dr. Arnoldo Cross and Dr. 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  He stated he's had a wound on his heel for approximately half a year but is worsened in the last 6-8 weeks. He's been followed closely by Dr. Dellia Cross in the wound care clinic his prescribed him doxycycline and more recently ciprofloxacin after recovering Pseudomonas from a deep tissue culture.  Pseudomonas was sensitive to all antibiotics tested including ciprofloxacin and levofloxacin with MIC the left less than equal 0.25 and 0.5 also sensitive to [name] [name] [name] and [name] [name] and [name].  Patient had undergone a CT scan of the heel which showed:  Osteolysis and active osteomyelitis of the calcaneus.  He states that ciprofloxacin is improved his wound but the wound is now getting bigger after debridement from wound care and treatment with the hyperbaric chamber.   When I first saw him I had cautioned that he would need [name] to cure his chronic deep bone infection but that we could pursue bone biopsy followed by targetted IV abx followed by indefinite oral abx. I had referred him to [name] but he ended up being admitted to [name] for worsening infection, foul odor erythema from around the wound.   Dr. Migdalia Cross took him to the OR and did a bone biopsy for culture which yielded abundant GBS.   The following day however he decided he had better proceed with curative [name] which [name] performed. She was seen by my partners in ID and abx were stopped within 72 hours post surgery.  He has been dc to [name]. Unfortunately he appears to have  developed a soft tissue infection involving his stump with purulence at the site and Dr Sharol Cross started him on doxy--though I do not see it in the [name].  He has also noticed a new erytyhematous lesion on big toe on the left side and darkening of his skin there.   [name] LIving  I saw him cultured the area of purulence and added [name] and he grew a [name] that was S to this abx.  Since then the would has worsening dehiscence and purulenc. He saw [name]  Who took him to the OR for I and D of stump infection on  02/14/14. Marland Kitchen He has continued on doxy and [name] but then bled from the stump and was admitted to the Cross sp transfusion and re-exam by [name] dc yesterday.   Review of Systems  Constitutional: Negative for fever, chills, diaphoresis, appetite change and fatigue.  Eyes: Negative for photophobia and visual disturbance.  Respiratory: Negative for cough, chest tightness, shortness of breath and wheezing.   Cardiovascular: Negative for chest pain and palpitations.  Gastrointestinal: Negative for nausea, vomiting and diarrhea.  Musculoskeletal: Positive for arthralgias. Negative for back pain and myalgias.  Skin: Positive for wound. Negative for color change, pallor and rash.  Neurological: Negative for dizziness, tremors, weakness and light-headedness.  Hematological: Bruises/bleeds easily.  Psychiatric/Behavioral: Negative for behavioral problems, confusion, sleep disturbance, dysphoric mood, decreased concentration and agitation.       Objective:   Physical Exam  Constitutional: He is oriented to person, place, and time. He appears well-developed  and well-nourished. No distress.  HENT:  Head: Normocephalic and atraumatic.  Mouth/Throat: Oropharynx is clear and moist. No oropharyngeal exudate.  Eyes: Conjunctivae and EOM are normal. No scleral icterus.  Neck: Normal range of motion. Neck supple.  Cardiovascular: Normal rate and normal heart sounds.  An irregularly  irregular rhythm present. Exam reveals no friction rub.   No murmur heard. Pulmonary/Chest: Effort normal and breath sounds normal. No respiratory distress. He has no wheezes. He has no rales. He exhibits no tenderness.    Abdominal: Soft. Bowel sounds are normal. He exhibits no distension. There is no tenderness. There is no rebound.  Musculoskeletal: He exhibits no edema and no tenderness.       Legs: Neurological: He is alert and oriented to person, place, and time. He exhibits normal muscle tone. Coordination normal.  Skin: Skin is warm and dry. He is not diaphoretic. There is erythema. No pallor.  Psychiatric: He has a normal mood and affect. His behavior is normal. Judgment and thought content normal.   Right [name] site: area or prior bleeding has stablized. No purulence.              Assessment & Plan:   Stump bleed: stabliized, To followup with [name] tomorrow.   Stump infection:  Sp I and D  --continue doxy and [name] --rtc in one month --follow closely with [name]  I spent greater than 25 minutes with the patient including greater than 50% of time in face to face counsel of the patient and in coordination of their care.   Diabetic osteomyelitis of the calcaneus: cured with [name], GBS was culprit ID on deep culture   Left leg with new erythematous area: area is concerning for embolic lesion ? Due to PVD vs endocarditis? He had normal ABI's in January.  I discussed with [name] and they have TTE scheduled but not yet done due to [name] admissions.

## 2014-03-05 ENCOUNTER — Encounter: Payer: Self-pay | Admitting: Internal Medicine

## 2014-03-05 ENCOUNTER — Ambulatory Visit (HOSPITAL_COMMUNITY): Payer: Medicare Other | Attending: Internal Medicine | Admitting: Radiology

## 2014-03-05 DIAGNOSIS — I4891 Unspecified atrial fibrillation: Secondary | ICD-10-CM | POA: Diagnosis present

## 2014-03-05 NOTE — Progress Notes (Signed)
Echocardiogram Performed. 

## 2014-03-06 ENCOUNTER — Other Ambulatory Visit (HOSPITAL_COMMUNITY): Payer: Self-pay | Admitting: Orthopedic Surgery

## 2014-03-06 MED ORDER — CEFAZOLIN SODIUM-DEXTROSE 2-3 GM-% IV SOLR: 2.0000 g | INTRAVENOUS | Status: DC

## 2014-03-06 NOTE — Pre-Procedure Instructions (Signed)
JERRID FORGETTE  03/06/2014   Your procedure is scheduled on:  Friday, March 07, 2014 at 4:40 PM.   Report to [facility] Entrance "A" Admitting Office at 2:10 PM.   Call this number if you have problems the morning of surgery: [phone]   Remember:   Do not eat food or drink liquids after midnight tonight.    Take these medicines the morning of surgery with A SIP OF WATER:  Diltiazem, Digoxin, Omeprazole, Lorazepam - if needed and Oxycodone - if needed.  Do not give patient any of his diabetic medications the day of surgery.    Do not wear jewelry.  Do not wear lotions, powders, or cologen. You may wear deodorant.  Men may shave face and neck.  Do not bring valuables to the hospital.  [facility] is not responsible                  for any belongings or valuables.               Contacts, dentures or bridgework may not be worn into surgery.  Leave suitcase in the car. After surgery it may be brought to your room.  For patients admitted to the hospital, discharge time is determined by your                treatment team.

## 2014-03-06 NOTE — Progress Notes (Signed)
[place] and spoke with [place], pt's nurse. She states she will fax pt's medical hx and MAR to [place] now.

## 2014-03-07 ENCOUNTER — Encounter (HOSPITAL_COMMUNITY)
Admission: RE | Disposition: A | Discharge status: Skilled Nursing Facility | Payer: Self-pay | Source: Ambulatory Visit | Attending: Orthopedic Surgery

## 2014-03-07 ENCOUNTER — Encounter (HOSPITAL_COMMUNITY): Payer: Medicare Other | Admitting: Anesthesiology

## 2014-03-07 ENCOUNTER — Encounter (HOSPITAL_COMMUNITY): Payer: Self-pay | Admitting: *Deleted

## 2014-03-07 ENCOUNTER — Ambulatory Visit (HOSPITAL_COMMUNITY): Payer: Medicare Other | Admitting: Anesthesiology

## 2014-03-07 ENCOUNTER — Ambulatory Visit (HOSPITAL_COMMUNITY)
Admission: RE | Admit: 2014-03-07 | Discharge: 2014-03-07 | Disposition: A | Discharge status: Skilled Nursing Facility | Payer: Medicare Other | Source: Ambulatory Visit | Attending: Orthopedic Surgery | Admitting: Orthopedic Surgery

## 2014-03-07 DIAGNOSIS — Y835 Amputation of limb(s) as the cause of abnormal reaction of the patient, or of later complication, without mention of misadventure at the time of the procedure: Secondary | ICD-10-CM | POA: Insufficient documentation

## 2014-03-07 DIAGNOSIS — Z860100 Personal history of colon polyps, unspecified: Secondary | ICD-10-CM | POA: Insufficient documentation

## 2014-03-07 DIAGNOSIS — I872 Venous insufficiency (chronic) (peripheral): Secondary | ICD-10-CM

## 2014-03-07 DIAGNOSIS — I70269 Atherosclerosis of native arteries of extremities with gangrene, unspecified extremity: Secondary | ICD-10-CM

## 2014-03-07 DIAGNOSIS — I509 Heart failure, unspecified: Secondary | ICD-10-CM | POA: Insufficient documentation

## 2014-03-07 DIAGNOSIS — S88119A Complete traumatic amputation at level between knee and ankle, unspecified lower leg, initial encounter: Secondary | ICD-10-CM

## 2014-03-07 DIAGNOSIS — K222 Esophageal obstruction: Secondary | ICD-10-CM

## 2014-03-07 DIAGNOSIS — E663 Overweight: Secondary | ICD-10-CM | POA: Insufficient documentation

## 2014-03-07 DIAGNOSIS — R5383 Other fatigue: Secondary | ICD-10-CM | POA: Insufficient documentation

## 2014-03-07 DIAGNOSIS — Z950 Presence of cardiac pacemaker: Secondary | ICD-10-CM | POA: Insufficient documentation

## 2014-03-07 DIAGNOSIS — I4891 Unspecified atrial fibrillation: Secondary | ICD-10-CM

## 2014-03-07 DIAGNOSIS — I4510 Unspecified right bundle-branch block: Secondary | ICD-10-CM

## 2014-03-07 DIAGNOSIS — I10 Essential (primary) hypertension: Secondary | ICD-10-CM | POA: Insufficient documentation

## 2014-03-07 DIAGNOSIS — R5381 Other malaise: Secondary | ICD-10-CM | POA: Insufficient documentation

## 2014-03-07 DIAGNOSIS — I498 Other specified cardiac arrhythmias: Secondary | ICD-10-CM

## 2014-03-07 DIAGNOSIS — M109 Gout, unspecified: Secondary | ICD-10-CM

## 2014-03-07 DIAGNOSIS — Z86718 Personal history of other venous thrombosis and embolism: Secondary | ICD-10-CM

## 2014-03-07 DIAGNOSIS — K219 Gastro-esophageal reflux disease without esophagitis: Secondary | ICD-10-CM | POA: Insufficient documentation

## 2014-03-07 DIAGNOSIS — T8789 Other complications of amputation stump: Secondary | ICD-10-CM

## 2014-03-07 HISTORY — PX: AMPUTATION: SHX166

## 2014-03-07 LAB — BASIC METABOLIC PANEL
BUN: 28 mg/dL — ABNORMAL HIGH (ref 6–23)
CO2: 25 mEq/L (ref 19–32)
Calcium: 9.6 mg/dL (ref 8.4–10.5)
Chloride: 98 mEq/L (ref 96–112)
Creatinine, Ser: 1.11 mg/dL (ref 0.50–1.35)
GFR calc Af Amer: 73 mL/min — ABNORMAL LOW (ref >90)
GFR, EST NON AFRICAN AMERICAN: 63 mL/min — AB (ref >90)
GLUCOSE: 123 mg/dL — AB (ref 70–99)
Potassium: 4.5 mEq/L (ref 3.7–5.3)
SODIUM: 136 meq/L — AB (ref 137–147)

## 2014-03-07 LAB — CBC
HCT: 30.4 % — ABNORMAL LOW (ref 39.0–52.0)
Hemoglobin: 9.5 g/dL — ABNORMAL LOW (ref 13.0–17.0)
MCH: 25.5 pg — AB (ref 26.0–34.0)
MCHC: 31.3 g/dL (ref 30.0–36.0)
MCV: 81.7 fL (ref 78.0–100.0)
PLATELETS: 249 10*3/uL (ref 150–400)
RBC: 3.72 MIL/uL — ABNORMAL LOW (ref 4.22–5.81)
RDW: 15.7 % — ABNORMAL HIGH (ref 11.5–15.5)
WBC: 9.3 10*3/uL (ref 4.0–10.5)

## 2014-03-07 LAB — GLUCOSE, CAPILLARY
GLUCOSE-CAPILLARY: 81 mg/dL (ref 70–99)
Glucose-Capillary: 115 mg/dL — ABNORMAL HIGH (ref 70–99)
Glucose-Capillary: 89 mg/dL (ref 70–99)

## 2014-03-07 SURGERY — AMPUTATION BELOW KNEE
Anesthesia: General | Site: Leg Lower | Laterality: Right

## 2014-03-07 MED ORDER — FENTANYL CITRATE 0.05 MG/ML IJ SOLN
INTRAMUSCULAR | Status: DC | PRN
  Administered 2014-03-07: 25 ug via INTRAVENOUS
  Administered 2014-03-07: 50 ug via INTRAVENOUS
  Administered 2014-03-07: 75 ug via INTRAVENOUS

## 2014-03-07 MED ORDER — ARTIFICIAL TEARS OP OINT
TOPICAL_OINTMENT | OPHTHALMIC | Status: AC
  Filled 2014-03-07: qty 3.5

## 2014-03-07 MED ORDER — LACTATED RINGERS IV SOLN
INTRAVENOUS | Status: DC | PRN
  Administered 2014-03-07: 15:00:00 via INTRAVENOUS

## 2014-03-07 MED ORDER — CEFAZOLIN SODIUM-DEXTROSE 2-3 GM-% IV SOLR
INTRAVENOUS | Status: AC
  Administered 2014-03-07: 2 g via INTRAVENOUS
  Filled 2014-03-07: qty 50

## 2014-03-07 MED ORDER — PHENYLEPHRINE HCL 10 MG/ML IJ SOLN
INTRAMUSCULAR | Status: DC | PRN
  Administered 2014-03-07: 80 ug via INTRAVENOUS

## 2014-03-07 MED ORDER — HYDROMORPHONE HCL PF 1 MG/ML IJ SOLN
INTRAMUSCULAR | Status: AC
  Filled 2014-03-07: qty 1

## 2014-03-07 MED ORDER — PROPOFOL 10 MG/ML IV BOLUS
INTRAVENOUS | Status: DC | PRN
  Administered 2014-03-07: 120 mg via INTRAVENOUS

## 2014-03-07 MED ORDER — 0.9 % SODIUM CHLORIDE (POUR BTL) OPTIME
TOPICAL | Status: DC | PRN
  Administered 2014-03-07: 1000 mL

## 2014-03-07 MED ORDER — ONDANSETRON HCL 4 MG/2ML IJ SOLN: 4.0000 mg | Freq: Once | INTRAMUSCULAR | Status: DC | PRN

## 2014-03-07 MED ORDER — ONDANSETRON HCL 4 MG/2ML IJ SOLN
INTRAMUSCULAR | Status: DC | PRN
  Administered 2014-03-07: 4 mg via INTRAVENOUS

## 2014-03-07 MED ORDER — OXYCODONE HCL 5 MG PO TABS
10.0000 mg | ORAL_TABLET | Freq: Four times a day (QID) | ORAL | Status: DC | PRN
  Administered 2014-03-07: 10 mg via ORAL

## 2014-03-07 MED ORDER — FENTANYL CITRATE 0.05 MG/ML IJ SOLN
INTRAMUSCULAR | Status: AC
  Filled 2014-03-07: qty 5

## 2014-03-07 MED ORDER — LIDOCAINE HCL (CARDIAC) 20 MG/ML IV SOLN
INTRAVENOUS | Status: DC | PRN
  Administered 2014-03-07: 100 mg via INTRAVENOUS

## 2014-03-07 MED ORDER — LACTATED RINGERS IV SOLN
INTRAVENOUS | Status: DC
  Administered 2014-03-07: 14:00:00 via INTRAVENOUS

## 2014-03-07 MED ORDER — PROPOFOL 10 MG/ML IV BOLUS
INTRAVENOUS | Status: AC
  Filled 2014-03-07: qty 20

## 2014-03-07 MED ORDER — ARTIFICIAL TEARS OP OINT
TOPICAL_OINTMENT | OPHTHALMIC | Status: DC | PRN
  Administered 2014-03-07: 1 via OPHTHALMIC

## 2014-03-07 MED ORDER — OXYCODONE HCL 5 MG PO TABS
ORAL_TABLET | ORAL | Status: AC
  Filled 2014-03-07: qty 2

## 2014-03-07 MED ORDER — HYDROMORPHONE HCL PF 1 MG/ML IJ SOLN
INTRAMUSCULAR | Status: AC
  Administered 2014-03-07: 0.5 mg via INTRAVENOUS
  Filled 2014-03-07: qty 1

## 2014-03-07 MED ORDER — HYDROMORPHONE HCL PF 1 MG/ML IJ SOLN
0.2500 mg | INTRAMUSCULAR | Status: DC | PRN
  Administered 2014-03-07 (×4): 0.5 mg via INTRAVENOUS

## 2014-03-07 MED ORDER — HYDROMORPHONE HCL PF 1 MG/ML IJ SOLN
0.5000 mg | INTRAMUSCULAR | Status: AC | PRN
  Administered 2014-03-07 (×4): 0.5 mg via INTRAVENOUS

## 2014-03-07 MED ORDER — ONDANSETRON HCL 4 MG/2ML IJ SOLN
INTRAMUSCULAR | Status: AC
  Filled 2014-03-07: qty 2

## 2014-03-07 SURGICAL SUPPLY — 44 items
BANDAGE ESMARK 6X9 LF (GAUZE/BANDAGES/DRESSINGS) ×1 IMPLANT
BANDAGE GAUZE ELAST BULKY 4 IN (GAUZE/BANDAGES/DRESSINGS) ×6 IMPLANT
BLADE SAW RECIP 87.9 MT (BLADE) ×3 IMPLANT
BLADE SURG 21 STRL SS (BLADE) ×3 IMPLANT
BNDG COHESIVE 6X5 TAN STRL LF (GAUZE/BANDAGES/DRESSINGS) ×3 IMPLANT
BNDG ELASTIC 6X10 VLCR STRL LF (GAUZE/BANDAGES/DRESSINGS) ×3 IMPLANT
BNDG ESMARK 6X9 LF (GAUZE/BANDAGES/DRESSINGS) ×3
COVER SURGICAL LIGHT HANDLE (MISCELLANEOUS) ×3 IMPLANT
CUFF TOURNIQUET SINGLE 34IN LL (TOURNIQUET CUFF) IMPLANT
CUFF TOURNIQUET SINGLE 44IN (TOURNIQUET CUFF) IMPLANT
DRAIN PENROSE 1/2X12 LTX STRL (WOUND CARE) IMPLANT
DRAPE EXTREMITY T 121X128X90 (DRAPE) ×3 IMPLANT
DRAPE PROXIMA HALF (DRAPES) ×6 IMPLANT
DRAPE U-SHAPE 47X51 STRL (DRAPES) ×3 IMPLANT
DRSG ADAPTIC 3X8 NADH LF (GAUZE/BANDAGES/DRESSINGS) ×3 IMPLANT
DRSG PAD ABDOMINAL 8X10 ST (GAUZE/BANDAGES/DRESSINGS) ×6 IMPLANT
DURAPREP 26ML APPLICATOR (WOUND CARE) ×3 IMPLANT
ELECT REM PT RETURN 9FT ADLT (ELECTROSURGICAL) ×3
ELECTRODE REM PT RTRN 9FT ADLT (ELECTROSURGICAL) ×1 IMPLANT
GLOVE BIOGEL PI IND STRL 9 (GLOVE) ×1 IMPLANT
GLOVE BIOGEL PI INDICATOR 9 (GLOVE) ×2
GLOVE SURG ORTHO 9.0 STRL STRW (GLOVE) ×3 IMPLANT
GOWN STRL REUS W/ TWL XL LVL3 (GOWN DISPOSABLE) ×1 IMPLANT
GOWN STRL REUS W/TWL XL LVL3 (GOWN DISPOSABLE) ×2
KIT BASIN OR (CUSTOM PROCEDURE TRAY) ×3 IMPLANT
KIT ROOM TURNOVER OR (KITS) ×3 IMPLANT
MANIFOLD NEPTUNE II (INSTRUMENTS) ×3 IMPLANT
NS IRRIG 1000ML POUR BTL (IV SOLUTION) ×3 IMPLANT
PACK GENERAL/GYN (CUSTOM PROCEDURE TRAY) ×3 IMPLANT
PAD ABD 8X10 STRL (GAUZE/BANDAGES/DRESSINGS) ×3 IMPLANT
PAD ARMBOARD 7.5X6 YLW CONV (MISCELLANEOUS) ×3 IMPLANT
SPONGE GAUZE 4X4 12PLY (GAUZE/BANDAGES/DRESSINGS) ×3 IMPLANT
SPONGE LAP 18X18 X RAY DECT (DISPOSABLE) IMPLANT
STAPLER VISISTAT 35W (STAPLE) IMPLANT
STOCKINETTE IMPERVIOUS LG (DRAPES) ×3 IMPLANT
SUT ETHILON 2 0 PSLX (SUTURE) ×3 IMPLANT
SUT PDS AB 1 CT  36 (SUTURE)
SUT PDS AB 1 CT 36 (SUTURE) IMPLANT
SUT SILK 2 0 (SUTURE) ×2
SUT SILK 2-0 18XBRD TIE 12 (SUTURE) ×1 IMPLANT
TOWEL OR 17X24 6PK STRL BLUE (TOWEL DISPOSABLE) ×3 IMPLANT
TOWEL OR 17X26 10 PK STRL BLUE (TOWEL DISPOSABLE) ×3 IMPLANT
TUBE ANAEROBIC SPECIMEN COL (MISCELLANEOUS) IMPLANT
WATER STERILE IRR 1000ML POUR (IV SOLUTION) ×3 IMPLANT

## 2014-03-07 NOTE — Discharge Instructions (Signed)

## 2014-03-07 NOTE — Transfer of Care (Signed)
Immediate Anesthesia Transfer of Care Note  Patient: [name]  Procedure(s) Performed: Procedure(s) with comments: AMPUTATION BELOW KNEE (Right) - Right Below Knee Amputation Revision  Patient Location: PACU  Anesthesia Type:General  Level of Consciousness: awake, alert  and oriented  Airway & Oxygen Therapy: Patient Spontanous Breathing and Patient connected to face mask oxygen  Post-op Assessment: Report given to PACU RN  Post vital signs: Reviewed and stable  Complications: No apparent anesthesia complications

## 2014-03-07 NOTE — Anesthesia Preprocedure Evaluation (Signed)
Anesthesia Evaluation  Patient identified by MRN, date of birth, ID band Patient awake    Reviewed: Allergy & Precautions, H&P , NPO status , Patient's Chart, lab work & pertinent test results  Airway       Dental   Pulmonary          Cardiovascular hypertension, + Peripheral Vascular Disease and +CHF + dysrhythmias Atrial Fibrillation + pacemaker     Neuro/Psych TIA   GI/Hepatic GERD-  ,  Endo/Other  diabetes, Type 2  Renal/GU      Musculoskeletal   Abdominal   Peds  Hematology  (+) anemia ,   Anesthesia Other Findings   Reproductive/Obstetrics                           Anesthesia Physical Anesthesia Plan  ASA: IV  Anesthesia Plan: General   Post-op Pain Management:    Induction: Intravenous  Airway Management Planned: Oral ETT and LMA  Additional Equipment:   Intra-op Plan:   Post-operative Plan: Extubation in OR  Informed Consent: I have reviewed the patients History and Physical, chart, labs and discussed the procedure including the risks, benefits and alternatives for the proposed anesthesia with the patient or authorized representative who has indicated his/her understanding and acceptance.     Plan Discussed with:   Anesthesia Plan Comments:         Anesthesia Quick Evaluation

## 2014-03-07 NOTE — Op Note (Signed)
OPERATIVE REPORT  DATE OF SURGERY: 03/07/2014  PATIENT:  [name],  79 y.o. male  PRE-OPERATIVE DIAGNOSIS:  Dehiscence Right BKA  POST-OPERATIVE DIAGNOSIS:  Dehiscence Right BKA  PROCEDURE:  Procedure(s): AMPUTATION BELOW KNEE revision  SURGEON:  Surgeon(s): [name], MD  ANESTHESIA:   general  EBL:  min ML  SPECIMEN:  No Specimen  TOURNIQUET:  * No tourniquets in log *  PROCEDURE DETAILS: Patient is a 73 she'll gentleman with severe peripheral vascular disease. Patient presents with massive dehiscence of his transtibial amputation and presents for revision. Risk and benefits were discussed including need for an above-the-knee amputation. Patient states he understands and wished to proceed at this time. Description of procedure patient brought to the operating room and underwent a general anesthetic. After adequate levels of anesthesia were obtained patient's right lower extremity was prepped using DuraPrep draped into a sterile field. A fishmouth incision was made around the necrotic tissue this was carried down to bone the tibia and fibular were resected the vascular bundles were suture ligated with 2-0 silk hemostasis was obtained. Patient is still had marginally viable tissue. The incision was closed using 2-0 nylon and staples. The wound is covered with Adaptic 4 x 4's ABDs dressing Kerlix and Coban. Patient was extubated taken to the PACU in stable condition.  PLAN OF CARE: Discharge to home after PACU  PATIENT DISPOSITION:  PACU - hemodynamically stable.   [name], MD 03/07/2014 5:28 PM

## 2014-03-07 NOTE — Anesthesia Postprocedure Evaluation (Signed)
Anesthesia Post Note  Patient: [name]  Procedure(s) Performed: Procedure(s) (LRB): AMPUTATION BELOW KNEE (Right)  Anesthesia type: general  Patient location: PACU  Post pain: Pain level controlled  Post assessment: Patient's Cardiovascular Status Stable  Last Vitals:  Filed Vitals:   03/07/14 1815  BP:   Pulse: 80  Temp:   Resp: 13    Post vital signs: Reviewed and stable  Level of consciousness: sedated  Complications: No apparent anesthesia complications

## 2014-03-07 NOTE — H&P (Signed)
[name] is an 79 y.o. male.   Chief Complaint: Right BKA dehiscence HPI: Patient is a 79 year old gentleman who is status post transtibial amputation on the right. Patient has had progressive dehiscence of the wound  Past Medical History  Diagnosis Date  . Fatigue   . Overweight   . Syncope and collapse   . GERD (gastroesophageal reflux disease)   . Right bundle branch block   . Unspecified venous (peripheral) insufficiency   . Phlebitis and thrombophlebitis of superficial vessels of lower extremities   . Cellulitis and abscess of leg, except foot   . Backache, unspecified   . Long term (current) use of anticoagulants   . Type II or unspecified type diabetes mellitus without mention of complication, not stated as uncontrolled   . Unspecified venous (peripheral) insufficiency   . Hypertension   . Gout, unspecified   . Heart failure   . Atrial fibrillation     permanent  . Esophageal stricture   . Hx of colonic polyps 05-2006    (Adenomatous)Dr. [name]  . Diverticulosis 05-2006    Dr. [name]   . Tachycardia-bradycardia     s/p PPM  . Pacemaker- [name] 02/20/2013  . CHF (congestive heart failure)     Past Surgical History  Procedure Laterality Date  . Laminectomy    . Lumbar fusion    . Rotator cuff repair    . Ruptured rt rectus muscle    . Eye surgery    . Pacemaker insertion  8/12    SJM by [name] for tachy/brady syndrome  . I&d extremity Right 01/17/2014    Procedure: IRRIGATION AND DEBRIDEMENT RIGHT HEEL  WITH CULTURES AND BONE BIOPSY, placement of wound vac;  Surgeon: Theodoro Kos, DO;  Location: [place];  Service: Plastics;  Laterality: Right;  . Amputation Right 01/20/2014    Procedure: AMPUTATION BELOW KNEE- right;  Surgeon: [name], MD;  Location: [place];  Service: Orthopedics;  Laterality: Right;  Right Below Knee Amputation  . Amputation Right 02/14/2014    Procedure: AMPUTATION BELOW KNEE;  Surgeon: [name], MD;  Location: [place];  Service:  Orthopedics;  Laterality: Right;  Right Below Knee Amputation Revision    Family History  Problem Relation Age of Onset  . Pneumonia Mother   . Heart disease Father   . Diabetes Father   . Colon cancer Neg Hx    Social History:  reports that he has never smoked. He has never used smokeless tobacco. He reports that he does not drink alcohol or use illicit drugs.  Allergies:  Allergies  Allergen Reactions  . Morphine Rash and Other (See Comments)    Irritability also    No prescriptions prior to admission    No results found for this or any previous visit (from the past 48 hour(s)). No results found.  Review of Systems  All other systems reviewed and are negative.   There were no vitals taken for this visit. Physical Exam  Dehiscence right transtibial amputation. Assessment/Plan Assessment: Dehiscence right transtibial amputation.  Plan: Will plan for revision of the right transtibial amputation. Risks and benefits were discussed including nonhealing of the wound. Patient states he understands was pursued this time.  [name] 03/07/2014, 6:25 AM

## 2014-03-08 ENCOUNTER — Encounter (HOSPITAL_COMMUNITY): Payer: Self-pay | Admitting: Emergency Medicine

## 2014-03-08 ENCOUNTER — Other Ambulatory Visit: Payer: Self-pay

## 2014-03-08 ENCOUNTER — Inpatient Hospital Stay (HOSPITAL_COMMUNITY)
Admission: EM | Admit: 2014-03-08 | Discharge: 2014-03-11 | DRG: 920 | Disposition: A | Discharge status: Skilled Nursing Facility | Payer: Medicare Other | Attending: Internal Medicine | Admitting: Internal Medicine

## 2014-03-08 DIAGNOSIS — I10 Essential (primary) hypertension: Secondary | ICD-10-CM | POA: Diagnosis present

## 2014-03-08 DIAGNOSIS — E1159 Type 2 diabetes mellitus with other circulatory complications: Secondary | ICD-10-CM

## 2014-03-08 DIAGNOSIS — M109 Gout, unspecified: Secondary | ICD-10-CM | POA: Diagnosis present

## 2014-03-08 DIAGNOSIS — T879 Unspecified complications of amputation stump: Secondary | ICD-10-CM

## 2014-03-08 DIAGNOSIS — E871 Hypo-osmolality and hyponatremia: Secondary | ICD-10-CM | POA: Diagnosis present

## 2014-03-08 DIAGNOSIS — E785 Hyperlipidemia, unspecified: Secondary | ICD-10-CM | POA: Diagnosis present

## 2014-03-08 DIAGNOSIS — I872 Venous insufficiency (chronic) (peripheral): Secondary | ICD-10-CM | POA: Diagnosis present

## 2014-03-08 DIAGNOSIS — Z7901 Long term (current) use of anticoagulants: Secondary | ICD-10-CM

## 2014-03-08 DIAGNOSIS — Z89511 Acquired absence of right leg below knee: Secondary | ICD-10-CM

## 2014-03-08 DIAGNOSIS — I5032 Chronic diastolic (congestive) heart failure: Secondary | ICD-10-CM | POA: Diagnosis present

## 2014-03-08 DIAGNOSIS — Z950 Presence of cardiac pacemaker: Secondary | ICD-10-CM | POA: Diagnosis not present

## 2014-03-08 DIAGNOSIS — Z8672 Personal history of thrombophlebitis: Secondary | ICD-10-CM

## 2014-03-08 DIAGNOSIS — Y835 Amputation of limb(s) as the cause of abnormal reaction of the patient, or of later complication, without mention of misadventure at the time of the procedure: Secondary | ICD-10-CM | POA: Diagnosis present

## 2014-03-08 DIAGNOSIS — E1169 Type 2 diabetes mellitus with other specified complication: Secondary | ICD-10-CM | POA: Diagnosis present

## 2014-03-08 DIAGNOSIS — K222 Esophageal obstruction: Secondary | ICD-10-CM | POA: Diagnosis present

## 2014-03-08 DIAGNOSIS — I4891 Unspecified atrial fibrillation: Secondary | ICD-10-CM | POA: Diagnosis present

## 2014-03-08 DIAGNOSIS — M9080 Osteopathy in diseases classified elsewhere, unspecified site: Secondary | ICD-10-CM | POA: Diagnosis present

## 2014-03-08 DIAGNOSIS — IMO0002 Reserved for concepts with insufficient information to code with codable children: Principal | ICD-10-CM | POA: Diagnosis present

## 2014-03-08 DIAGNOSIS — D62 Acute posthemorrhagic anemia: Secondary | ICD-10-CM | POA: Diagnosis present

## 2014-03-08 DIAGNOSIS — M869 Osteomyelitis, unspecified: Secondary | ICD-10-CM | POA: Diagnosis present

## 2014-03-08 DIAGNOSIS — Z888 Allergy status to other drugs, medicaments and biological substances status: Secondary | ICD-10-CM

## 2014-03-08 DIAGNOSIS — Z794 Long term (current) use of insulin: Secondary | ICD-10-CM | POA: Diagnosis not present

## 2014-03-08 DIAGNOSIS — E119 Type 2 diabetes mellitus without complications: Secondary | ICD-10-CM | POA: Diagnosis present

## 2014-03-08 DIAGNOSIS — I798 Other disorders of arteries, arterioles and capillaries in diseases classified elsewhere: Secondary | ICD-10-CM | POA: Diagnosis present

## 2014-03-08 DIAGNOSIS — Z981 Arthrodesis status: Secondary | ICD-10-CM

## 2014-03-08 DIAGNOSIS — I4510 Unspecified right bundle-branch block: Secondary | ICD-10-CM | POA: Diagnosis present

## 2014-03-08 DIAGNOSIS — I509 Heart failure, unspecified: Secondary | ICD-10-CM | POA: Diagnosis present

## 2014-03-08 DIAGNOSIS — D500 Iron deficiency anemia secondary to blood loss (chronic): Secondary | ICD-10-CM | POA: Diagnosis present

## 2014-03-08 DIAGNOSIS — D689 Coagulation defect, unspecified: Secondary | ICD-10-CM | POA: Diagnosis present

## 2014-03-08 DIAGNOSIS — K219 Gastro-esophageal reflux disease without esophagitis: Secondary | ICD-10-CM | POA: Diagnosis present

## 2014-03-08 DIAGNOSIS — S88119A Complete traumatic amputation at level between knee and ankle, unspecified lower leg, initial encounter: Secondary | ICD-10-CM

## 2014-03-08 LAB — CBC WITH DIFFERENTIAL/PLATELET
BASOS ABS: 0 10*3/uL (ref 0.0–0.1)
Basophils Relative: 0 % (ref 0–1)
EOS ABS: 0.1 10*3/uL (ref 0.0–0.7)
Eosinophils Relative: 1 % (ref 0–5)
HEMATOCRIT: 19.7 % — AB (ref 39.0–52.0)
Hemoglobin: 6.3 g/dL — CL (ref 13.0–17.0)
LYMPHS PCT: 24 % (ref 12–46)
Lymphs Abs: 2.9 10*3/uL (ref 0.7–4.0)
MCH: 26.1 pg (ref 26.0–34.0)
MCHC: 32 g/dL (ref 30.0–36.0)
MCV: 81.7 fL (ref 78.0–100.0)
MONO ABS: 1 10*3/uL (ref 0.1–1.0)
Monocytes Relative: 8 % (ref 3–12)
NEUTROS ABS: 7.9 10*3/uL — AB (ref 1.7–7.7)
NEUTROS PCT: 67 % (ref 43–77)
Platelets: 236 10*3/uL (ref 150–400)
RBC: 2.41 MIL/uL — ABNORMAL LOW (ref 4.22–5.81)
RDW: 15.8 % — ABNORMAL HIGH (ref 11.5–15.5)
WBC: 11.9 10*3/uL — ABNORMAL HIGH (ref 4.0–10.5)

## 2014-03-08 LAB — PROTIME-INR
INR: 1.37 (ref 0.00–1.49)
Prothrombin Time: 16.5 seconds — ABNORMAL HIGH (ref 11.6–15.2)

## 2014-03-08 LAB — BASIC METABOLIC PANEL
BUN: 32 mg/dL — ABNORMAL HIGH (ref 6–23)
CALCIUM: 8.7 mg/dL (ref 8.4–10.5)
CO2: 23 meq/L (ref 19–32)
Chloride: 93 mEq/L — ABNORMAL LOW (ref 96–112)
Creatinine, Ser: 1.62 mg/dL — ABNORMAL HIGH (ref 0.50–1.35)
GFR calc Af Amer: 46 mL/min — ABNORMAL LOW (ref >90)
GFR calc non Af Amer: 40 mL/min — ABNORMAL LOW (ref >90)
GLUCOSE: 183 mg/dL — AB (ref 70–99)
Potassium: 4.7 mEq/L (ref 3.7–5.3)
Sodium: 130 mEq/L — ABNORMAL LOW (ref 137–147)

## 2014-03-08 MED ORDER — ONDANSETRON HCL 4 MG/2ML IJ SOLN: 4.0000 mg | Freq: Four times a day (QID) | INTRAMUSCULAR | Status: DC | PRN

## 2014-03-08 MED ORDER — HYDROMORPHONE HCL PF 1 MG/ML IJ SOLN: 0.5000 mg | INTRAMUSCULAR | Status: DC | PRN

## 2014-03-08 MED ORDER — SODIUM CHLORIDE 0.9 % IV SOLN
INTRAVENOUS | Status: DC
  Administered 2014-03-09: 06:00:00 via INTRAVENOUS

## 2014-03-08 MED ORDER — INSULIN ASPART 100 UNIT/ML ~~LOC~~ SOLN
0.0000 [IU] | Freq: Every day | SUBCUTANEOUS | Status: DC
  Administered 2014-03-10: 2 [IU] via SUBCUTANEOUS

## 2014-03-08 MED ORDER — DILTIAZEM HCL ER COATED BEADS 180 MG PO CP24
180.0000 mg | ORAL_CAPSULE | Freq: Every day | ORAL | Status: DC
  Administered 2014-03-09 – 2014-03-11 (×3): 180 mg via ORAL
  Filled 2014-03-08 (×3): qty 1

## 2014-03-08 MED ORDER — OXYCODONE-ACETAMINOPHEN 5-325 MG PO TABS
2.0000 | ORAL_TABLET | Freq: Once | ORAL | Status: AC
  Administered 2014-03-08: 2 via ORAL
  Filled 2014-03-08: qty 2

## 2014-03-08 MED ORDER — PANTOPRAZOLE SODIUM 40 MG PO TBEC
40.0000 mg | DELAYED_RELEASE_TABLET | Freq: Every day | ORAL | Status: DC
  Administered 2014-03-09 – 2014-03-11 (×3): 40 mg via ORAL
  Filled 2014-03-08 (×3): qty 1

## 2014-03-08 MED ORDER — LORAZEPAM 0.5 MG PO TABS
0.2500 mg | ORAL_TABLET | Freq: Two times a day (BID) | ORAL | Status: DC | PRN
  Administered 2014-03-09 (×2): 0.25 mg via ORAL
  Filled 2014-03-08 (×2): qty 1

## 2014-03-08 MED ORDER — ACETAMINOPHEN 325 MG PO TABS
650.0000 mg | ORAL_TABLET | Freq: Four times a day (QID) | ORAL | Status: DC | PRN
  Administered 2014-03-10: 650 mg via ORAL

## 2014-03-08 MED ORDER — FUROSEMIDE 40 MG PO TABS
40.0000 mg | ORAL_TABLET | Freq: Every day | ORAL | Status: DC
  Administered 2014-03-09 – 2014-03-10 (×2): 40 mg via ORAL
  Filled 2014-03-08 (×3): qty 1

## 2014-03-08 MED ORDER — LISINOPRIL 10 MG PO TABS
10.0000 mg | ORAL_TABLET | Freq: Every day | ORAL | Status: DC
  Administered 2014-03-09 – 2014-03-11 (×3): 10 mg via ORAL
  Filled 2014-03-08 (×3): qty 1

## 2014-03-08 MED ORDER — CEPHALEXIN 500 MG PO CAPS
500.0000 mg | ORAL_CAPSULE | Freq: Four times a day (QID) | ORAL | Status: DC
  Filled 2014-03-08: qty 1

## 2014-03-08 MED ORDER — INSULIN GLARGINE 100 UNIT/ML ~~LOC~~ SOLN
20.0000 [IU] | Freq: Every day | SUBCUTANEOUS | Status: DC
  Administered 2014-03-09 – 2014-03-10 (×3): 20 [IU] via SUBCUTANEOUS
  Filled 2014-03-08 (×4): qty 0.2

## 2014-03-08 MED ORDER — MELATONIN 5 MG PO TABS: 5.0000 mg | ORAL_TABLET | Freq: Every evening | ORAL | Status: DC | PRN

## 2014-03-08 MED ORDER — OXYCODONE HCL 5 MG PO TABS: 5.0000 mg | ORAL_TABLET | ORAL | Status: DC | PRN

## 2014-03-08 MED ORDER — INSULIN ASPART 100 UNIT/ML ~~LOC~~ SOLN
0.0000 [IU] | Freq: Three times a day (TID) | SUBCUTANEOUS | Status: DC
  Administered 2014-03-09 (×2): 2 [IU] via SUBCUTANEOUS
  Administered 2014-03-09: 1 [IU] via SUBCUTANEOUS
  Administered 2014-03-10 – 2014-03-11 (×4): 2 [IU] via SUBCUTANEOUS

## 2014-03-08 MED ORDER — DECUBI-VITE PO CAPS: 1.0000 | ORAL_CAPSULE | Freq: Every day | ORAL | Status: DC

## 2014-03-08 MED ORDER — OXYCODONE HCL 10 MG PO TABS: 10.0000 mg | ORAL_TABLET | Freq: Four times a day (QID) | ORAL | Status: DC | PRN

## 2014-03-08 MED ORDER — GLUCERNA SHAKE PO LIQD
237.0000 mL | Freq: Two times a day (BID) | ORAL | Status: DC
  Administered 2014-03-09 – 2014-03-11 (×6): 237 mL via ORAL

## 2014-03-08 MED ORDER — SODIUM CHLORIDE 0.9 % IJ SOLN
3.0000 mL | Freq: Two times a day (BID) | INTRAMUSCULAR | Status: DC
  Administered 2014-03-09 – 2014-03-11 (×5): 3 mL via INTRAVENOUS

## 2014-03-08 MED ORDER — FERROUS FUMARATE 325 (106 FE) MG PO TABS
1.0000 | ORAL_TABLET | Freq: Every day | ORAL | Status: DC
  Administered 2014-03-09 – 2014-03-11 (×3): 106 mg via ORAL
  Filled 2014-03-08 (×4): qty 1

## 2014-03-08 MED ORDER — INSULIN GLARGINE 100 UNIT/ML SOLOSTAR PEN: 20.0000 [IU] | PEN_INJECTOR | Freq: Every day | SUBCUTANEOUS | Status: DC

## 2014-03-08 MED ORDER — ACETAMINOPHEN 650 MG RE SUPP: 650.0000 mg | Freq: Four times a day (QID) | RECTAL | Status: DC | PRN

## 2014-03-08 MED ORDER — ONDANSETRON HCL 4 MG PO TABS: 4.0000 mg | ORAL_TABLET | Freq: Four times a day (QID) | ORAL | Status: DC | PRN

## 2014-03-08 MED ORDER — DIGOXIN 125 MCG PO TABS
0.1250 mg | ORAL_TABLET | Freq: Every day | ORAL | Status: DC
  Administered 2014-03-09 – 2014-03-11 (×3): 0.125 mg via ORAL
  Filled 2014-03-08 (×3): qty 1

## 2014-03-08 NOTE — ED Notes (Signed)
Notified Dr Jeanell Sparrow of hgb 6.3

## 2014-03-08 NOTE — ED Provider Notes (Signed)
CSN: [id number]     Arrival date & time 03/08/14  2036 History   First MD Initiated Contact with Patient 03/08/14 2050     Chief Complaint  Patient presents with  . Post-op Problem     (Consider location/radiation/quality/duration/timing/severity/associated sxs/prior Treatment) HPI This is a 79 year old male status post revision of his right BKA yesterday who presents with ongoing bleeding and generalized weakness. He has had several revisions of this and has required blood transfusion during the past 2 weeks. Preoperatively his hemoglobin was 9. He states that they have had to change his dressing multiple times today and has had difficulty controlling the bleeding at the nursing home. He has been on Elaquisl is secondary to atrial fibrillation. Nursing home spoke with Dr. Sharol Given this morning and the eloquist was stopped.   Past Medical History  Diagnosis Date  . Fatigue   . Overweight   . Syncope and collapse   . GERD (gastroesophageal reflux disease)   . Right bundle branch block   . Unspecified venous (peripheral) insufficiency   . Phlebitis and thrombophlebitis of superficial vessels of lower extremities   . Cellulitis and abscess of leg, except foot   . Backache, unspecified   . Long term (current) use of anticoagulants   . Type II or unspecified type diabetes mellitus without mention of complication, not stated as uncontrolled   . Unspecified venous (peripheral) insufficiency   . Hypertension   . Gout, unspecified   . Heart failure   . Atrial fibrillation     permanent  . Esophageal stricture   . Hx of colonic polyps 05-2006    (Adenomatous)Dr. [name]  . Diverticulosis 05-2006    Dr. [name]   . Tachycardia-bradycardia     s/p PPM  . Pacemaker- [name] 02/20/2013  . CHF (congestive heart failure)    Past Surgical History  Procedure Laterality Date  . Laminectomy    . Lumbar fusion    . Rotator cuff repair    . Ruptured rt rectus muscle    . Eye surgery    . Pacemaker  insertion  8/12    SJM by [name] for tachy/brady syndrome  . I&d extremity Right 01/17/2014    Procedure: IRRIGATION AND DEBRIDEMENT RIGHT HEEL  WITH CULTURES AND BONE BIOPSY, placement of wound vac;  Surgeon: Theodoro Kos, DO;  Location: [place];  Service: Plastics;  Laterality: Right;  . Amputation Right 01/20/2014    Procedure: AMPUTATION BELOW KNEE- right;  Surgeon: [name], MD;  Location: [place];  Service: Orthopedics;  Laterality: Right;  Right Below Knee Amputation  . Amputation Right 02/14/2014    Procedure: AMPUTATION BELOW KNEE;  Surgeon: [name], MD;  Location: [place];  Service: Orthopedics;  Laterality: Right;  Right Below Knee Amputation Revision   Family History  Problem Relation Age of Onset  . Pneumonia Mother   . Heart disease Father   . Diabetes Father   . Colon cancer Neg Hx    History  Substance Use Topics  . Smoking status: Never Smoker   . Smokeless tobacco: Never Used  . Alcohol Use: No     Comment: two drinks daily - not while at [place]    Allergies  Morphine  Home Medications   Prior to Admission medications   Medication Sig Start Date End Date Taking? Authorizing Provider  apixaban (ELIQUIS) 5 MG TABS tablet Take 1 tablet (5 mg total) by mouth 2 (two)  times daily. 04/04/13  Yes Ricard Dillon, MD  cephALEXin (KEFLEX) 500 MG capsule Take 1 capsule (500 mg total) by mouth 4 (four) times daily. 02/06/14  Yes Truman Hayward, MD  digoxin (LANOXIN) 0.125 MG tablet Take 0.125 mg by mouth daily.   Yes Historical Provider, MD  diltiazem (CARDIZEM CD) 180 MG 24 hr capsule Take 1 capsule (180 mg total) by mouth daily. 10/01/13  Yes Ricard Dillon, MD  doxycycline (VIBRA-TABS) 100 MG tablet Take 100 mg by mouth 2 (two) times daily. 30 day course started 02/05/2014   Yes Historical Provider, MD  feeding supplement, GLUCERNA SHAKE, (GLUCERNA SHAKE) LIQD Take 237 mLs by mouth 2 (two) times daily between meals. 03/03/14  Yes  Barton Dubois, MD  ferrous fumarate (HEMOCYTE - 106 MG FE) 325 (106 FE) MG TABS tablet Take 1 tablet (106 mg of iron total) by mouth daily at 12 noon. 03/03/14  Yes Barton Dubois, MD  furosemide (LASIX) 40 MG tablet Take 1 tablet (40 mg total) by mouth daily. 01/24/14  Yes Verlee Monte, MD  insulin aspart (NOVOLOG FLEXPEN) 100 UNIT/ML FlexPen Inject 0-10 Units into the skin 3 (three) times daily with meals. Per sliding scale:  cbg 150-250 5 units, 251-300 8 units, 301-500 10 units (call MD if over 350)   Yes Historical Provider, MD  Insulin Glargine (LANTUS SOLOSTAR) 100 UNIT/ML Solostar Pen Inject 20 Units into the skin at bedtime. 03/03/14  Yes Barton Dubois, MD  lisinopril (PRINIVIL,ZESTRIL) 10 MG tablet Take 10 mg by mouth daily. 01/24/14  Yes Verlee Monte, MD  LORazepam (ATIVAN) 0.5 MG tablet Take 0.5 tablets (0.25 mg total) by mouth every 12 (twelve) hours as needed for anxiety. 03/03/14  Yes Barton Dubois, MD  Melatonin 5 MG TABS Take 5 mg by mouth at bedtime as needed (insomnia).   Yes Historical Provider, MD  Multiple Vitamins-Minerals (DECUBI-VITE) CAPS Take 1 capsule by mouth daily. With meal   Yes Historical Provider, MD  omeprazole (PRILOSEC) 20 MG capsule Take 20 mg by mouth 2 (two) times daily before a meal.   Yes Historical Provider, MD  Oxycodone HCl 10 MG TABS Take 1 tablet (10 mg total) by mouth every 6 (six) hours as needed (pain). 03/03/14  Yes Barton Dubois, MD  silver sulfADIAZINE (SILVADENE) 1 % cream Apply 1 application topically daily.   Yes Historical Provider, MD   BP 125/45  Pulse 87  Temp(Src) 98.3 F (36.8 C) (Oral)  Resp 17  SpO2 100% Physical Exam  ED Course  Procedures (including critical care time) Labs Review Labs Reviewed  CBC WITH DIFFERENTIAL - Abnormal; Notable for the following:    WBC 11.9 (*)    RBC 2.41 (*)    Hemoglobin 6.3 (*)    HCT 19.7 (*)    RDW 15.8 (*)    All other components within normal limits  BASIC METABOLIC PANEL - Abnormal; Notable  for the following:    Sodium 130 (*)    Chloride 93 (*)    Glucose, Bld 183 (*)    BUN 32 (*)    Creatinine, Ser 1.62 (*)    GFR calc non Af Amer 40 (*)    GFR calc Af Amer 46 (*)    All other components within normal limits  PROTIME-INR - Abnormal; Notable for the following:    Prothrombin Time 16.5 (*)    All other components within normal limits  TYPE AND SCREEN  PREPARE RBC (CROSSMATCH)    Imaging Review  No results found.   EKG Interpretation None      MDM   Final diagnoses:  Anemia due to acute blood loss  Atrial fibrillation  Coagulopathy  Chronic diastolic CHF (congestive heart failure)  Hx of right BKA  Hyperlipidemia  Type II or unspecified type diabetes mellitus with peripheral circulatory disorders, not stated as uncontrolled(250.70)  Unspecified essential hypertension  Unspecified venous (peripheral) insufficiency   Discussed with hospitalist and plan admission for transfusion and control of further bleeding.     Shaune Pollack, MD 03/09/14 2030

## 2014-03-08 NOTE — ED Notes (Signed)
Pt reports that he had BKA 1 month ago and he has had to go back twice for them to remove more. He had to have 2 pints of blood last weekend when he was here and yesterday they removed more from leg. Pt now is having issues controlling the bleeding from site. Pt also has diarrhea. [facility].

## 2014-03-09 ENCOUNTER — Encounter (HOSPITAL_COMMUNITY): Payer: Self-pay | Admitting: *Deleted

## 2014-03-09 DIAGNOSIS — D689 Coagulation defect, unspecified: Secondary | ICD-10-CM | POA: Diagnosis present

## 2014-03-09 DIAGNOSIS — IMO0002 Reserved for concepts with insufficient information to code with codable children: Secondary | ICD-10-CM | POA: Diagnosis not present

## 2014-03-09 LAB — BASIC METABOLIC PANEL
BUN: 29 mg/dL — ABNORMAL HIGH (ref 6–23)
CO2: 25 mEq/L (ref 19–32)
Calcium: 8.8 mg/dL (ref 8.4–10.5)
Chloride: 98 mEq/L (ref 96–112)
Creatinine, Ser: 1.4 mg/dL — ABNORMAL HIGH (ref 0.50–1.35)
GFR, EST AFRICAN AMERICAN: 55 mL/min — AB (ref >90)
GFR, EST NON AFRICAN AMERICAN: 47 mL/min — AB (ref >90)
GLUCOSE: 126 mg/dL — AB (ref 70–99)
POTASSIUM: 4.4 meq/L (ref 3.7–5.3)
SODIUM: 135 meq/L — AB (ref 137–147)

## 2014-03-09 LAB — NA AND K (SODIUM & POTASSIUM), RAND UR
Potassium Urine: 40 mEq/L
SODIUM UR: 28 meq/L

## 2014-03-09 LAB — GLUCOSE, CAPILLARY
GLUCOSE-CAPILLARY: 163 mg/dL — AB (ref 70–99)
Glucose-Capillary: 124 mg/dL — ABNORMAL HIGH (ref 70–99)
Glucose-Capillary: 170 mg/dL — ABNORMAL HIGH (ref 70–99)
Glucose-Capillary: 178 mg/dL — ABNORMAL HIGH (ref 70–99)
Glucose-Capillary: 185 mg/dL — ABNORMAL HIGH (ref 70–99)

## 2014-03-09 LAB — HEMOGLOBIN A1C
HEMOGLOBIN A1C: 5.9 % — AB (ref <5.7)
Mean Plasma Glucose: 123 mg/dL — ABNORMAL HIGH (ref <117)

## 2014-03-09 LAB — CBC
HCT: 25.6 % — ABNORMAL LOW (ref 39.0–52.0)
Hemoglobin: 8.3 g/dL — ABNORMAL LOW (ref 13.0–17.0)
MCH: 26.5 pg (ref 26.0–34.0)
MCHC: 32.4 g/dL (ref 30.0–36.0)
MCV: 81.8 fL (ref 78.0–100.0)
PLATELETS: 193 10*3/uL (ref 150–400)
RBC: 3.13 MIL/uL — AB (ref 4.22–5.81)
RDW: 15.1 % (ref 11.5–15.5)
WBC: 9.8 10*3/uL (ref 4.0–10.5)

## 2014-03-09 LAB — OSMOLALITY, URINE: OSMOLALITY UR: 304 mosm/kg — AB (ref 390–1090)

## 2014-03-09 LAB — PREPARE RBC (CROSSMATCH)

## 2014-03-09 MED ORDER — ADULT MULTIVITAMIN W/MINERALS CH
1.0000 | ORAL_TABLET | Freq: Every day | ORAL | Status: DC
  Administered 2014-03-09 – 2014-03-11 (×3): 1 via ORAL
  Filled 2014-03-09 (×3): qty 1

## 2014-03-09 MED ORDER — DIPHENHYDRAMINE HCL 25 MG PO CAPS: 25.0000 mg | ORAL_CAPSULE | Freq: Every evening | ORAL | Status: DC | PRN

## 2014-03-09 MED ORDER — OXYCODONE HCL 5 MG PO TABS
10.0000 mg | ORAL_TABLET | ORAL | Status: DC | PRN
  Administered 2014-03-09 – 2014-03-11 (×8): 10 mg via ORAL
  Filled 2014-03-09 (×8): qty 2

## 2014-03-09 MED ORDER — ZOLPIDEM TARTRATE 5 MG PO TABS
5.0000 mg | ORAL_TABLET | Freq: Every evening | ORAL | Status: DC | PRN
  Administered 2014-03-09 – 2014-03-10 (×2): 5 mg via ORAL
  Filled 2014-03-09 (×2): qty 1

## 2014-03-09 MED ORDER — CEPHALEXIN 500 MG PO CAPS
500.0000 mg | ORAL_CAPSULE | Freq: Three times a day (TID) | ORAL | Status: DC
  Administered 2014-03-09 – 2014-03-11 (×9): 500 mg via ORAL
  Filled 2014-03-09 (×11): qty 1

## 2014-03-09 NOTE — Progress Notes (Signed)
Notified Lynch, NP via text page that pt's diastolic BP is running in the 30-40's. Pt is asymptomatic. No new orders given. Will continue to monitor pt. Ranelle Oyster, RN

## 2014-03-09 NOTE — Progress Notes (Addendum)
Pt admitted to 5w35 from ED. Pt is from [facility]. Pt is A&Ox4. Pt's has a right BKA that the dsg is saturated with blood. Changed dsg to right BKA applied wet to dry dsg with 4x4 gauze and abdominal pads and wrapped in kerliex. Pt has sutures and staples to right BKA. Pt's left great toe has necrotic area measuring 1/2cmx1/2cm ota. Pt's left leg is discolorated with dry flaky skin. Pt has dry scabbed areas to top of head. Pt has stage 2 pressure ulcer to left buttocks measuring 1x1cm applied epc to area. Pt oriented to room. Placed yellow sock to left foot and yellow armband. Instructed pt to call for assistance. Pt stated understanding. Will continue to monitor pt. Ranelle Oyster, RN

## 2014-03-09 NOTE — H&P (Signed)
[facility] History and Physical  [name] KVQ:[id number] DOB: 06-Sep-1935 DOA: 03/08/2014  Referring physician:  EDP PCP: Georgetta Haber, MD  Specialists:   Chief Complaint: Increased Bleeding from Surgical Wound   HPI: [name] is a 79 y.o. male with Multiple Medical problems including PVD, and DM2 who underwent a revision of his Right BKA 1 days ago who returns to the ED from the Rehab center due to profuse bleeding from his BKA Revision Site.   He is on Eliquis Rx due to Atrial Fibrillation, and the Eliquis Rx was discontinued this AM.   His hemoglobin level was found to be 6.3 on admission.    He is in the [facility] for Rehab Rx.     Review of Systems:  Constitutional: No Weight Loss, No Weight Gain, Night Sweats, Fevers, Chills, Fatigue, or +Generalized Weakness HEENT: No Headaches, Difficulty Swallowing,Tooth/Dental Problems,Sore Throat,  No Sneezing, Rhinitis, Ear Ache, Nasal Congestion, or Post Nasal Drip,  Cardio-vascular:  No Chest pain, Orthopnea, PND, Edema in lower extremities, Anasarca, Dizziness, Palpitations  Resp: No Dyspnea, No DOE, No Productive Cough, No Non-Productive Cough, No Hemoptysis, No Change in Color of Mucus,  No Wheezing.    GI: No Heartburn, Indigestion, Abdominal Pain, Nausea, Vomiting, Diarrhea, Change in Bowel Habits,  Loss of Appetite  GU: No Dysuria, Change in Color of Urine, No Urgency or Frequency.  No flank pain.  Musculoskeletal: No Joint Pain or Swelling.  No Decreased Range of Motion. No Back Pain.  Neurologic: No Syncope, No Seizures, Muscle Weakness, Paresthesia, Vision Disturbance or Loss, No Diplopia, No Vertigo, No Difficulty Walking,  Skin: No Rash or Lesions. Psych: No Change in Mood or Affect. No Depression or Anxiety. No Memory loss. No Confusion or Hallucinations   Past Medical History  Diagnosis Date  . Fatigue   . Overweight   . Syncope and collapse   . GERD (gastroesophageal reflux disease)   .  Right bundle branch block   . Unspecified venous (peripheral) insufficiency   . Phlebitis and thrombophlebitis of superficial vessels of lower extremities   . Cellulitis and abscess of leg, except foot   . Backache, unspecified   . Long term (current) use of anticoagulants   . Type II or unspecified type diabetes mellitus without mention of complication, not stated as uncontrolled   . Unspecified venous (peripheral) insufficiency   . Hypertension   . Gout, unspecified   . Heart failure   . Atrial fibrillation     permanent  . Esophageal stricture   . Hx of colonic polyps 05-2006    (Adenomatous)Dr. [name]  . Diverticulosis 05-2006    Dr. [name]   . Tachycardia-bradycardia     s/p PPM  . Pacemaker- [name] 02/20/2013  . CHF (congestive heart failure)       Past Surgical History  Procedure Laterality Date  . Laminectomy    . Lumbar fusion    . Rotator cuff repair    . Ruptured rt rectus muscle    . Eye surgery    . Pacemaker insertion  8/12    SJM by [name] for tachy/brady syndrome  . I&d extremity Right 01/17/2014    Procedure: IRRIGATION AND DEBRIDEMENT RIGHT HEEL  WITH CULTURES AND BONE BIOPSY, placement of wound vac;  Surgeon: Theodoro Kos, DO;  Location: [place];  Service: Plastics;  Laterality: Right;  . Amputation Right 01/20/2014    Procedure: AMPUTATION BELOW KNEE- right;  Surgeon: [name], MD;  Location: [place];  Service: Orthopedics;  Laterality: Right;  Right Below Knee Amputation  . Amputation Right 02/14/2014    Procedure: AMPUTATION BELOW KNEE;  Surgeon: [name], MD;  Location: [place];  Service: Orthopedics;  Laterality: Right;  Right Below Knee Amputation Revision      Prior to Admission medications   Medication Sig Start Date End Date Taking? Authorizing Provider  apixaban (ELIQUIS) 5 MG TABS tablet Take 1 tablet (5 mg total) by mouth 2 (two) times daily. 04/04/13  Yes Ricard Dillon, MD  cephALEXin (KEFLEX) 500 MG capsule Take 1 capsule (500 mg  total) by mouth 4 (four) times daily. 02/06/14  Yes Truman Hayward, MD  digoxin (LANOXIN) 0.125 MG tablet Take 0.125 mg by mouth daily.   Yes Historical Provider, MD  diltiazem (CARDIZEM CD) 180 MG 24 hr capsule Take 1 capsule (180 mg total) by mouth daily. 10/01/13  Yes Ricard Dillon, MD  doxycycline (VIBRA-TABS) 100 MG tablet Take 100 mg by mouth 2 (two) times daily. 30 day course started 02/05/2014   Yes Historical Provider, MD  feeding supplement, GLUCERNA SHAKE, (GLUCERNA SHAKE) LIQD Take 237 mLs by mouth 2 (two) times daily between meals. 03/03/14  Yes Barton Dubois, MD  ferrous fumarate (HEMOCYTE - 106 MG FE) 325 (106 FE) MG TABS tablet Take 1 tablet (106 mg of iron total) by mouth daily at 12 noon. 03/03/14  Yes Barton Dubois, MD  furosemide (LASIX) 40 MG tablet Take 1 tablet (40 mg total) by mouth daily. 01/24/14  Yes Verlee Monte, MD  insulin aspart (NOVOLOG FLEXPEN) 100 UNIT/ML FlexPen Inject 0-10 Units into the skin 3 (three) times daily with meals. Per sliding scale:  cbg 150-250 5 units, 251-300 8 units, 301-500 10 units (call MD if over 350)   Yes Historical Provider, MD  Insulin Glargine (LANTUS SOLOSTAR) 100 UNIT/ML Solostar Pen Inject 20 Units into the skin at bedtime. 03/03/14  Yes Barton Dubois, MD  lisinopril (PRINIVIL,ZESTRIL) 10 MG tablet Take 10 mg by mouth daily. 01/24/14  Yes Verlee Monte, MD  LORazepam (ATIVAN) 0.5 MG tablet Take 0.5 tablets (0.25 mg total) by mouth every 12 (twelve) hours as needed for anxiety. 03/03/14  Yes Barton Dubois, MD  Melatonin 5 MG TABS Take 5 mg by mouth at bedtime as needed (insomnia).   Yes Historical Provider, MD  Multiple Vitamins-Minerals (DECUBI-VITE) CAPS Take 1 capsule by mouth daily. With meal   Yes Historical Provider, MD  omeprazole (PRILOSEC) 20 MG capsule Take 20 mg by mouth 2 (two) times daily before a meal.   Yes Historical Provider, MD  Oxycodone HCl 10 MG TABS Take 1 tablet (10 mg total) by mouth every 6 (six) hours as needed (pain).  03/03/14  Yes Barton Dubois, MD  silver sulfADIAZINE (SILVADENE) 1 % cream Apply 1 application topically daily.   Yes Historical Provider, MD      Allergies  Allergen Reactions  . Morphine Rash and Other (See Comments)    Irritability also     Social History:  reports that he has never smoked. He has never used smokeless tobacco. He reports that he does not drink alcohol or use illicit drugs.     Family History  Problem Relation Age of Onset  . Pneumonia Mother   . Heart disease Father   . Diabetes Father   . Colon cancer Neg Hx        Physical Exam:  GEN:  Pleasant  79 y.o. male  examined  and in no acute distress; cooperative with exam Filed Vitals:   03/08/14 2315 03/09/14 0014 03/09/14 0040 03/09/14 0103  BP: 144/41 132/45 131/47 135/38  Pulse: 81 93 85 90  Temp:  98.4 F (36.9 C) 98.2 F (36.8 C) 98.1 F (36.7 C)  TempSrc:  Oral Oral Oral  Resp: 15 18 16 17   Height:  5\' 10"  (1.778 m)    Weight:  74.1 kg (163 lb 5.8 oz)    SpO2: 100% 100% 100% 100%   Blood pressure 135/38, pulse 90, temperature 98.1 F (36.7 C), temperature source Oral, resp. rate 17, height 5\' 10"  (1.778 m), weight 74.1 kg (163 lb 5.8 oz), SpO2 100.00%. PSYCH: He is alert and oriented x4; does not appear anxious does not appear depressed; affect is normal HEENT: Normocephalic and Atraumatic, Mucous membranes pink; PERRLA; EOM intact; Fundi:  Benign;  No scleral icterus, Nares: Patent, Oropharynx: Clear, Edentulous with Dentures, Neck:  FROM, no cervical lymphadenopathy nor thyromegaly or carotid bruit; no JVD; Breasts:: Not examined CHEST WALL: No tenderness CHEST: Normal respiration, clear to auscultation bilaterally HEART: Regular rate and rhythm; no murmurs rubs or gallops BACK: No kyphosis or scoliosis; no CVA tenderness ABDOMEN: Positive Bowel Sounds, soft non-tender; no masses, no organomegaly.    Rectal Exam: Not done EXTREMITIES: + Right BKA Dressed;   LLE:  No cyanosis, clubbing or  edema; no ulcerations. Genitalia: not examined PULSES: 2+ and symmetric SKIN: Normal hydration no rash or ulceration CNS:  Alert X Oriented X 4, No Focal Deficits.     Vascular: pulses palpable throughout    Labs on Admission:  Basic Metabolic Panel:  Recent Labs Lab 03/02/14 0710 03/07/14 1348 03/08/14 2145  NA 138 136* 130*  K 4.3 4.5 4.7  CL 99 98 93*  CO2 24 25 23   GLUCOSE 122* 123* 183*  BUN 34* 28* 32*  CREATININE 1.28 1.11 1.62*  CALCIUM 8.9 9.6 8.7   Liver Function Tests:  Recent Labs Lab 03/02/14 0710  AST 12  ALT 7  ALKPHOS 95  BILITOT 0.9  PROT 6.4  ALBUMIN 2.6*   No results found for this basename: LIPASE, AMYLASE,  in the last 168 hours No results found for this basename: AMMONIA,  in the last 168 hours CBC:  Recent Labs Lab 03/02/14 0710 03/03/14 0620 03/07/14 1348 03/08/14 2145  WBC 6.0 7.9 9.3 11.9*  NEUTROABS  --   --   --  7.9*  HGB 7.1* 9.0* 9.5* 6.3*  HCT 21.9* 27.5* 30.4* 19.7*  MCV 83.0 81.6 81.7 81.7  PLT 219 225 249 236   Cardiac Enzymes: No results found for this basename: CKTOTAL, CKMB, CKMBINDEX, TROPONINI,  in the last 168 hours  BNP (last 3 results)  Recent Labs  01/16/14 2043  PROBNP 3148.0*   CBG:  Recent Labs Lab 03/03/14 1114 03/07/14 1341 03/07/14 1551 03/07/14 1643 03/09/14 0028  GLUCAP 162* 115* 89 81 185*    Radiological Exams on Admission: No results found.    Assessment/Plan:   79 y.o. male with  Principal Problem:   Anemia due to acute blood loss Active Problems:   Atrial fibrillation   Unspecified venous (peripheral) insufficiency   Hx of right BKA   Coagulopathy   Type II or unspecified type diabetes mellitus with peripheral circulatory disorders, not stated as uncontrolled(250.70)   HYPERTENSION   Chronic diastolic CHF (congestive heart failure)   Hyperlipidemia   Hyponatremia   1.   Anemia due to Blood Loss- from Right BKA  revision 1 days ago and coagulopathy on Elliquis Rx.   Monitor for continue loss, Transfuse 2 units,   Eliquis Rx discontinued in AM.     2.   Atrial Fibrillation-   On Eliquis Rx, which has been held due to Bleeding.  Continue Diltiazem, Digoxin for rate control.     3.   PVD/Hx of Right BKA Revision-  Ortho:  Dr Sharol Given.   Please Notify.     4.   DM2-   continue Lantus Rx, and SSI coverage PRN.     5.   HTN- Monitor BPs, continue Diltiazem, and Lisinopril adn lasix RX as BP tolerates.     6.   Chronic Diastolic CHF-  Lasix Daily.     7.   Hyperlipidemia-  Continue   8.  Hyponatremia-   Send Urine electrolytes and [place].  May be due to Diuretic Rx.     9.   SCDs for DVT prophylaxis    Code Status:  FULL CODE  Family Communication:   No  Family Present Disposition Plan:       Inpatient  Time spent:  [facility] Pager [phone]  If 7PM-7AM, please contact night-coverage [email or web address] Password [email or web address] 03/09/2014, 1:30 AM

## 2014-03-09 NOTE — Progress Notes (Signed)
[facility] PROGRESS NOTE  [name] KZS:[id number] DOB: 03/19/1935 DOA: 03/08/2014 PCP: Georgetta Haber, MD  Assessment/Plan: 1. Anemia of blood loss : - Oozing from the BKA  Revision site.  - transfused 2 more units of prbc today and repeat H&h is stable at 8.  - holding eliquis for one more week after discussing with Dr Sharol Given.  - check H&h in am.   2. Atrial fibrillation: - rate controlled. On diltiazem and dig.   3. Diabetes Mellitus: CBG (last 3)   Recent Labs  03/09/14 0028 03/09/14 0754 03/09/14 1230  GLUCAP 185* 124* 170*    Resume SSI.   4. Hypertension;  - controlled.   5. Hyponatremia:  - improved.   6.ACUTE ON CKD:  - improving with fluds.   7. DVT prophylaxis.     Code Status: full code Family Communication: none at bedside Disposition Plan: pending.    Consultants:  Dr Sharol Given  Procedures:  none  Antibiotics:  none  HPI/Subjective: Pain is better controlled with oxy Awaiting to see Dr Sharol Given.  Objective: Filed Vitals:   03/09/14 1500  BP: 148/67  Pulse: 85  Temp: 98.9 F (37.2 C)  Resp: 18    Intake/Output Summary (Last 24 hours) at 03/09/14 1620 Last data filed at 03/09/14 1215  Gross per 24 hour  Intake 850.83 ml  Output    625 ml  Net 225.83 ml   Filed Weights   03/09/14 0014  Weight: 74.1 kg (163 lb 5.8 oz)    Exam:   General:  Alert afebrile comfortable  Cardiovascular: s1s2  Respiratory: ctab  Abdomen: soft NT ND BS+  Musculoskeletal: right BKA bandaged.serosanguinous fluid around the bandage.   Data Reviewed: Basic Metabolic Panel:  Recent Labs Lab 03/07/14 1348 03/08/14 2145 03/09/14 0920  NA 136* 130* 135*  K 4.5 4.7 4.4  CL 98 93* 98  CO2 25 23 25   GLUCOSE 123* 183* 126*  BUN 28* 32* 29*  CREATININE 1.11 1.62* 1.40*  CALCIUM 9.6 8.7 8.8   Liver Function Tests: No results found for this basename: AST, ALT, ALKPHOS, BILITOT, PROT, ALBUMIN,  in the last 168 hours No  results found for this basename: LIPASE, AMYLASE,  in the last 168 hours No results found for this basename: AMMONIA,  in the last 168 hours CBC:  Recent Labs Lab 03/03/14 0620 03/07/14 1348 03/08/14 2145 03/09/14 0920  WBC 7.9 9.3 11.9* 9.8  NEUTROABS  --   --  7.9*  --   HGB 9.0* 9.5* 6.3* 8.3*  HCT 27.5* 30.4* 19.7* 25.6*  MCV 81.6 81.7 81.7 81.8  PLT 225 249 236 193   Cardiac Enzymes: No results found for this basename: CKTOTAL, CKMB, CKMBINDEX, TROPONINI,  in the last 168 hours BNP (last 3 results)  Recent Labs  01/16/14 2043  PROBNP 3148.0*   CBG:  Recent Labs Lab 03/07/14 1551 03/07/14 1643 03/09/14 0028 03/09/14 0754 03/09/14 1230  GLUCAP 89 81 185* 124* 170*    No results found for this or any previous visit (from the past 240 hour(s)).   Studies: No results found.  Scheduled Meds: . cephALEXin  500 mg Oral 3 times per day  . digoxin  0.125 mg Oral Daily  . diltiazem  180 mg Oral Daily  . feeding supplement (GLUCERNA SHAKE)  237 mL Oral BID BM  . ferrous fumarate  1 tablet Oral Q breakfast  . furosemide  40 mg Oral Daily  . insulin aspart  0-5 Units Subcutaneous  QHS  . insulin aspart  0-9 Units Subcutaneous TID WC  . insulin glargine  20 Units Subcutaneous QHS  . lisinopril  10 mg Oral Daily  . multivitamin with minerals  1 tablet Oral Daily  . pantoprazole  40 mg Oral Daily  . sodium chloride  3 mL Intravenous Q12H   Continuous Infusions: . sodium chloride 50 mL/hr at 03/09/14 8295    Principal Problem:   Anemia due to acute blood loss Active Problems:   Type II or unspecified type diabetes mellitus with peripheral circulatory disorders, not stated as uncontrolled(250.70)   HYPERTENSION   Atrial fibrillation   Chronic diastolic CHF (congestive heart failure)   Unspecified venous (peripheral) insufficiency   Hyperlipidemia   Hx of right BKA   Coagulopathy    Time spent: 25 min    Hosie Poisson  [facility] Pager  [phone]. If 7PM-7AM, please contact night-coverage at [email or web address], password [facility] 03/09/2014, 4:20 PM  LOS: 1 day

## 2014-03-09 NOTE — Progress Notes (Signed)
Patient ID: [name], male   DOB: Mar 31, 1935, 79 y.o.   MRN: [id number] Dressing changed from right transtibial amputation this morning. Status post transfusion for  acute blood loss anemia There is no active bleeding now. There are some very small blisters from swelling. A compression wrap was reapplied. Anticipate patient could be discharged back to skilled nursing tomorrow.

## 2014-03-09 NOTE — Progress Notes (Signed)
Notified Dr. Sharol Given that pt's rt stump incision is bleeding and leaking through dressing. Dr. Sharol Given stated to remove all the current dressing  and change dressing. Dr. Sharol Given stated to put 3 abdominal pads on rt BKA incision and wrap with 6 inch ace wrap. Will continue to monitor pt. Ranelle Oyster, RN

## 2014-03-09 NOTE — Progress Notes (Signed)
Clarified code status with pt. Pt stated he wanted to be a full code. Notified Arnoldo Morale, MD of this information. MD put in order for pt to be a full code. Will continue to monitor pt. Ranelle Oyster, RN

## 2014-03-09 NOTE — Progress Notes (Signed)
Notified [name] Merlin, NP that pt requesting sleep medication. NP entered order for benadryl prn. Will continue to monitor pt. Ranelle Oyster, RN

## 2014-03-10 ENCOUNTER — Encounter (HOSPITAL_COMMUNITY): Payer: Self-pay | Admitting: Orthopedic Surgery

## 2014-03-10 DIAGNOSIS — T879 Unspecified complications of amputation stump: Secondary | ICD-10-CM

## 2014-03-10 LAB — HEMOGLOBIN AND HEMATOCRIT, BLOOD
HCT: 21.9 % — ABNORMAL LOW (ref 39.0–52.0)
Hemoglobin: 7.3 g/dL — ABNORMAL LOW (ref 13.0–17.0)

## 2014-03-10 LAB — GLUCOSE, CAPILLARY
GLUCOSE-CAPILLARY: 114 mg/dL — AB (ref 70–99)
GLUCOSE-CAPILLARY: 181 mg/dL — AB (ref 70–99)
GLUCOSE-CAPILLARY: 239 mg/dL — AB (ref 70–99)
Glucose-Capillary: 178 mg/dL — ABNORMAL HIGH (ref 70–99)

## 2014-03-10 LAB — PREPARE RBC (CROSSMATCH)

## 2014-03-10 MED ORDER — OXYCODONE HCL 10 MG PO TABS: 10.0000 mg | ORAL_TABLET | Freq: Four times a day (QID) | ORAL | Status: DC | PRN

## 2014-03-10 MED ORDER — LORAZEPAM 0.5 MG PO TABS: 0.2500 mg | ORAL_TABLET | Freq: Two times a day (BID) | ORAL | Status: DC | PRN

## 2014-03-10 NOTE — Clinical Social Work Note (Signed)
Per RNCM note patient is not discharging today. RN note says that ortho MD states that it is ok for patient to DC to [facility] today. Facility has patient's information (DC Summary etc.). If patient can DC, RN can contact ambulance transport at [phone] (option 1, option 3) to transport patient to [facility]. DC packet is on patient's chart.   Liz Beach MSW, [place], [place], [phone]

## 2014-03-10 NOTE — Care Management Note (Signed)
    Page 1 of 1   03/11/2014     2:34:27 PM CARE MANAGEMENT NOTE 03/11/2014  Patient:  [name], [name]   Account Number:  [id number]  Date Initiated:  03/10/2014  Documentation initiated by:  Tomi Bamberger  Subjective/Objective Assessment:   dx anemia due to abl  admit- from [facility]     Action/Plan:   Anticipated DC Date:  03/11/2014   Anticipated DC Plan:  [place]  In-house referral  Clinical Social Worker      DC Planning Services  CM consult      Choice offered to / List presented to:             Status of service:  Completed, signed off Medicare Important Message given?   (If response is "NO", the following Medicare IM given date fields will be blank) Date Medicare IM given:   Date Additional Medicare IM given:    Discharge Disposition:  [place]  Per UR Regulation:  Reviewed for med. necessity/level of care/duration of stay  If discussed at [place] of Stay Meetings, dates discussed:    Comments:  03/11/14 [place], BSN [phone] patient for dc to snf today.  03/10/14 [place], BSN [phone] patient having bleeding from stump, per ortho will continue to monitor patient.

## 2014-03-10 NOTE — Progress Notes (Signed)
Patient ID: [name], male   DOB: Dec 23, 1934, 79 y.o.   MRN: [id number] Left transtibial amputation dressing clean and dry. Anticipate patient could be discharged to skilled nursing today.

## 2014-03-10 NOTE — Clinical Social Work Psychosocial (Signed)
Clinical Social Work Department BRIEF PSYCHOSOCIAL ASSESSMENT 03/10/2014  Patient:  [name], [name]     Account Number:  [id number]     Admit date:  03/08/2014  Clinical Social Worker:  Lovey Newcomer  Date/Time:  03/10/2014 10:30 AM  Referred by:  Physician  Date Referred:  03/10/2014 Referred for  SNF Placement   Other Referral:   Interview type:  Patient Other interview type:   Patient alert and oriented at time of assessment.    PSYCHOSOCIAL DATA Living Status:  WIFE Admitted from facility:  [place] of care:  [place] Primary support name:  [name] Primary support relationship to patient:  SPOUSE Degree of support available:   Support is strong.    CURRENT CONCERNS Current Concerns  Post-Acute Placement   Other Concerns:    SOCIAL WORK ASSESSMENT / PLAN CSW met with patient at bedside to complete assessment. CSW recognizes patient from a previous admission. Patient confirms that he is from [place] and plans to return to facility at discharge. Patient lives with his wife at home, and their home is across the street from [facility] so wife has access to visit patient at facility. Patient is anxious to get back to the facility and states, "I'm ready to go." CSW will assist with DC.   Assessment/plan status:  Psychosocial Support/Ongoing Assessment of Needs Other assessment/ plan:   Complete FL2, Fax   Information/referral to community resources:   CSW contact information given to patient.    PATIENT'S/FAMILY'S RESPONSE TO PLAN OF CARE: Patient states that he plans to return to [facility] at discharge. Patient states that he is happy with the care he receives at the facility. Patient was pleasant, appropriate, and appreciative of CSW contact.       Liz Beach MSW, [place], [place], [phone]

## 2014-03-10 NOTE — Progress Notes (Signed)
Dr. Sharol Given notified of patient having large amount of serous sanginous drainage with moderate sanguinous drainage noted to gauze closest to patient's incision. Dr. Sharol Given telephone ordered to change dressing to right stump daily, do not give blood thinner at this time and OK to d/c to [facility].

## 2014-03-10 NOTE — Discharge Summary (Signed)
Physician Discharge Summary  [name] JKK:[id number] DOB: 05/30/1935 DOA: 03/08/2014  PCP: Georgetta Haber, MD  Admit date: 03/08/2014 Discharge date: 03/10/2014  Time spent: 30 minutes  Recommendations for Outpatient Follow-up:  1. Follow up with PCP in one week 2. We have stopped your Eliquis because of bleeding from the BKA stump, Dr Sharol Given recommended stopping the Eliquis for one week, please make sure you restart it in one week.  3. Recommend checking CBC in one week.  4. Recommend checking BMP in one week to check renal function.  5. Recommend holding lasix for one week and restarting it after the renal function improves 6. Recommend follow up with Dr Tommy Medal, Dr Sharol Given as recommended.  7. Continue keflex and doxycycline for one month.   Discharge Diagnoses:  Principal Problem:   Anemia due to acute blood loss Active Problems:   Type II or unspecified type diabetes mellitus with peripheral circulatory disorders, not stated as uncontrolled(250.70)   HYPERTENSION   Atrial fibrillation   Chronic diastolic CHF (congestive heart failure)   Unspecified venous (peripheral) insufficiency   Hyperlipidemia   Hx of right BKA   Coagulopathy   Discharge Condition: improved.  Diet recommendation: low sodium diet.   Filed Weights   03/09/14 0014  Weight: 74.1 kg (163 lb 5.8 oz)    History of present illness:  [name] is a 79 y.o. male with Multiple Medical problems including PVD, and DM2 who underwent a revision of his Right BKA 1 days ago who returns to the ED from the Rehab center due to profuse bleeding from his BKA Revision Site. He is on Eliquis Rx due to Atrial Fibrillation, and the Eliquis Rx was discontinued this AM. His hemoglobin level was found to be 6.3 on admission. He is in the [facility] for Rehab Rx.   Hospital Course:   1. Anemia of blood loss : - Oozing from the BKA Revision site.  - transfused 2 more units of prbc today and repeat H&h is  stable at 8.  - holding eliquis for one more week after discussing with Dr Sharol Given. Please restart the Eliquis in one week after seeing Dr Sharol Given.   2. Atrial fibrillation:  - rate controlled. On diltiazem and dig.  3. Diabetes Mellitus:  CBG (last 3)   Recent Labs   03/09/14 0028  03/09/14 0754  03/09/14 1230   GLUCAP  185*  124*  170*    Resume SSI.  hgba1c is 5.9. Resume home dose of insulin.  4. Hypertension;  - controlled.  5. Hyponatremia:  - improved with fluids. .  6.ACUTE ON CKD:  - improving with fluids. Recommend holding lasix for a few days and restarting it after a BMP in one week.   7. Diabetic osteomyelitis of the calcaneous:  - sees Dr Tommy Medal as outpatient. Recommend to continue the keflex and doxycycline for one month, this was confirmed today with Dr Tommy Medal.    Procedures: none Consultations:  Orthopedics from Dr Sharol Given.  Discharge Exam: Filed Vitals:   03/10/14 0953  BP:   Pulse: 80  Temp:   Resp:     General: alert afebrile comfortable Cardiovascular: s1s2 Respiratory: ctab  Discharge Instructions You were cared for by a hospitalist during your hospital stay. If you have any questions about your discharge medications or the care you received while you were in the hospital after you are discharged, you can call the unit and asked to speak with the hospitalist  on call if the hospitalist that took care of you is not available. Once you are discharged, your primary care physician will handle any further medical issues. Please note that NO REFILLS for any discharge medications will be authorized once you are discharged, as it is imperative that you return to your primary care physician (or establish a relationship with a primary care physician if you do not have one) for your aftercare needs so that they can reassess your need for medications and monitor your lab values.      Discharge Orders   Future Appointments Provider Department Dept Phone    03/21/2014 3:00 PM Deboraha Sprang, MD [place] [facility] [phone]   04/07/2014 11:15 AM Truman Hayward, MD [facility] [phone]   05/15/2014 2:15 PM Wallene Huh, [place] [facility] [place]   Future Orders Complete By Expires   Change dressing  As directed    Scheduling Instructions:   Changed dry dressing with Ace compressive wrap daily.   Diet - low sodium heart healthy  As directed    Discharge instructions  As directed        Medication List    STOP taking these medications       apixaban 5 MG Tabs tablet  Commonly known as:  ELIQUIS     furosemide 40 MG tablet  Commonly known as:  LASIX      TAKE these medications       cephALEXin 500 MG capsule  Commonly known as:  KEFLEX  Take 1 capsule (500 mg total) by mouth 4 (four) times daily.     DECUBI-VITE Caps  Take 1 capsule by mouth daily. With meal     digoxin 0.125 MG tablet  Commonly known as:  LANOXIN  Take 0.125 mg by mouth daily.     diltiazem 180 MG 24 hr capsule  Commonly known as:  CARDIZEM CD  Take 1 capsule (180 mg total) by mouth daily.     doxycycline 100 MG tablet  Commonly known as:  VIBRA-TABS  Take 100 mg by mouth 2 (two) times daily. 30 day course started 02/05/2014     feeding supplement (GLUCERNA SHAKE) Liqd  Take 237 mLs by mouth 2 (two) times daily between meals.     ferrous fumarate 325 (106 FE) MG Tabs tablet  Commonly known as:  HEMOCYTE - 106 mg FE  Take 1 tablet (106 mg of iron total) by mouth daily at 12 noon.     Insulin Glargine 100 UNIT/ML Solostar Pen  Commonly known as:  LANTUS SOLOSTAR  Inject 20 Units into the skin at bedtime.     lisinopril 10 MG tablet  Commonly known as:  PRINIVIL,ZESTRIL  Take 10 mg by mouth daily.     LORazepam 0.5 MG tablet  Commonly known as:  ATIVAN  Take 0.5 tablets (0.25 mg total) by mouth every 12 (twelve) hours as needed for anxiety.     Melatonin 5 MG Tabs   Take 5 mg by mouth at bedtime as needed (insomnia).     NOVOLOG FLEXPEN 100 UNIT/ML FlexPen  Generic drug:  insulin aspart  Inject 0-10 Units into the skin 3 (three) times daily with meals. Per sliding scale:  cbg 150-250 5 units, 251-300 8 units, 301-500 10 units (call MD if over 350)     omeprazole 20 MG capsule  Commonly known as:  PRILOSEC  Take 20 mg by mouth 2 (  two) times daily before a meal.     Oxycodone HCl 10 MG Tabs  Take 1 tablet (10 mg total) by mouth every 6 (six) hours as needed (pain).     silver sulfADIAZINE 1 % cream  Commonly known as:  SILVADENE  Apply 1 application topically daily.       Allergies  Allergen Reactions  . Morphine Rash and Other (See Comments)    Irritability also   Follow-up Information   Follow up with DUDA,MARCUS V, MD In 1 week.   Specialty:  Orthopedic Surgery   Contact information:   [place] [address] [phone]       Follow up with Georgetta Haber, MD. Schedule an appointment as soon as possible for a visit in 1 week.   Specialty:  Internal Medicine   Contact information:   [place] [address] [phone]        The results of significant diagnostics from this hospitalization (including imaging, microbiology, ancillary and laboratory) are listed below for reference.    Significant Diagnostic Studies: No results found.  Microbiology: No results found for this or any previous visit (from the past 240 hour(s)).   Labs: Basic Metabolic Panel:  Recent Labs Lab 03/07/14 1348 03/08/14 2145 03/09/14 0920  NA 136* 130* 135*  K 4.5 4.7 4.4  CL 98 93* 98  CO2 25 23 25   GLUCOSE 123* 183* 126*  BUN 28* 32* 29*  CREATININE 1.11 1.62* 1.40*  CALCIUM 9.6 8.7 8.8   Liver Function Tests: No results found for this basename: AST, ALT, ALKPHOS, BILITOT, PROT, ALBUMIN,  in the last 168 hours No results found for this basename: LIPASE, AMYLASE,  in the last 168 hours No  results found for this basename: AMMONIA,  in the last 168 hours CBC:  Recent Labs Lab 03/07/14 1348 03/08/14 2145 03/09/14 0920  WBC 9.3 11.9* 9.8  NEUTROABS  --  7.9*  --   HGB 9.5* 6.3* 8.3*  HCT 30.4* 19.7* 25.6*  MCV 81.7 81.7 81.8  PLT 249 236 193   Cardiac Enzymes: No results found for this basename: CKTOTAL, CKMB, CKMBINDEX, TROPONINI,  in the last 168 hours BNP: BNP (last 3 results)  Recent Labs  01/16/14 2043  PROBNP 3148.0*   CBG:  Recent Labs Lab 03/09/14 0754 03/09/14 1230 03/09/14 1705 03/09/14 2110 03/10/14 0805  GLUCAP 124* 170* 178* 163* 114*       Signed:  Shakeema Lippman  [facility] 03/10/2014, 10:29 AM

## 2014-03-10 NOTE — Progress Notes (Addendum)
Dr. Karleen Hampshire notified of patients stump having large amount of discharge. Orders received to keep patient in 5W until further notice.

## 2014-03-11 LAB — CBC
HCT: 28.2 % — ABNORMAL LOW (ref 39.0–52.0)
Hemoglobin: 9.3 g/dL — ABNORMAL LOW (ref 13.0–17.0)
MCH: 27.6 pg (ref 26.0–34.0)
MCHC: 33 g/dL (ref 30.0–36.0)
MCV: 83.7 fL (ref 78.0–100.0)
Platelets: 182 10*3/uL (ref 150–400)
RBC: 3.37 MIL/uL — ABNORMAL LOW (ref 4.22–5.81)
RDW: 14.8 % (ref 11.5–15.5)
WBC: 9.9 10*3/uL (ref 4.0–10.5)

## 2014-03-11 LAB — GLUCOSE, CAPILLARY
GLUCOSE-CAPILLARY: 189 mg/dL — AB (ref 70–99)
Glucose-Capillary: 151 mg/dL — ABNORMAL HIGH (ref 70–99)

## 2014-03-11 NOTE — Discharge Summary (Signed)
Physician Discharge Summary  [name] OFB:[id number] DOB: [birth year] DOA: 03/08/2014  PCP: Georgetta Haber, MD  Admit date: 03/08/2014 Discharge date: 03/11/2014  Time spent: 30 minutes  Recommendations for Outpatient Follow-up:  1. Follow up with PCP in one week 2. We have stopped your Eliquis because of bleeding from the BKA stump, Dr Sharol Given recommended stopping the Eliquis for one week, please make sure you restart it in one week.  3. Recommend checking CBC in one week.  4. Recommend checking BMP in one week to check renal function.  5. Recommend holding lasix for one week and restarting it after the renal function improves 6. Recommend follow up with Dr Tommy Medal, Dr Sharol Given as recommended.  7. Continue keflex and doxycycline for one month.   Discharge Diagnoses:  Principal Problem:   Anemia due to acute blood loss Active Problems:   Type II or unspecified type diabetes mellitus with peripheral circulatory disorders, not stated as uncontrolled(250.70)   HYPERTENSION   Atrial fibrillation   Chronic diastolic CHF (congestive heart failure)   Unspecified venous (peripheral) insufficiency   Hyperlipidemia   Hx of right BKA   Coagulopathy   Discharge Condition: improved.  Diet recommendation: low sodium diet.   Filed Weights   03/09/14 0014  Weight: 74.1 kg (163 lb 5.8 oz)    History of present illness:  [name] is a 79 y.o. male with Multiple Medical problems including PVD, and DM2 who underwent a revision of his Right BKA 1 days ago who returns to the ED from the Rehab center due to profuse bleeding from his BKA Revision Site. He is on Eliquis Rx due to Atrial Fibrillation, and the Eliquis Rx was discontinued this AM. His hemoglobin level was found to be 6.3 on admission. He is in the [facility] for Rehab Rx.   Hospital Course:   1. Anemia of blood loss : - Oozing from the BKA Revision site.  - transfused 4 more units of prbcon this admission and  repeat H&h is stable at 9.3.  - holding eliquis for one more week after discussing with Dr Sharol Given. Please restart the Eliquis in one week after seeing Dr Sharol Given.   2. Atrial fibrillation:  - rate controlled. On diltiazem and dig.  3. Diabetes Mellitus:  CBG (last 3)   Recent Labs   03/09/14 0028  03/09/14 0754  03/09/14 1230   GLUCAP  185*  124*  170*    Resume SSI.  hgba1c is 5.9. Resume home dose of insulin.  4. Hypertension;  - controlled.  5. Hyponatremia:  - improved with fluids. .  6.ACUTE ON CKD:  - improving with fluids. Recommend holding lasix for a few days and restarting it after a BMP in one week.   7. Diabetic osteomyelitis of the calcaneous:  - sees Dr Tommy Medal as outpatient. Recommend to continue the keflex and doxycycline for one month, this was confirmed today with Dr Tommy Medal.    Procedures: none Consultations:  Orthopedics from Dr Sharol Given.  Discharge Exam: Filed Vitals:   03/11/14 1033  BP: 128/62  Pulse: 60  Temp:   Resp:     General: alert afebrile comfortable Cardiovascular: s1s2 Respiratory: ctab  Discharge Instructions You were cared for by a hospitalist during your hospital stay. If you have any questions about your discharge medications or the care you received while you were in the hospital after you are discharged, you can call the unit and asked to speak with the  hospitalist on call if the hospitalist that took care of you is not available. Once you are discharged, your primary care physician will handle any further medical issues. Please note that NO REFILLS for any discharge medications will be authorized once you are discharged, as it is imperative that you return to your primary care physician (or establish a relationship with a primary care physician if you do not have one) for your aftercare needs so that they can reassess your need for medications and monitor your lab values.      Discharge Orders   Future Appointments Provider  Department Dept Phone   03/21/2014 3:00 PM Deboraha Sprang, MD [facility] [phone]   04/07/2014 11:15 AM Truman Hayward, MD [facility] [phone]   05/15/2014 2:15 PM Wallene Huh, [place] [facility] [place]   Future Orders Complete By Expires   Change dressing  As directed    Scheduling Instructions:   Changed dry dressing with Ace compressive wrap daily.   Diet - low sodium heart healthy  As directed    Discharge instructions  As directed        Medication List    STOP taking these medications       apixaban 5 MG Tabs tablet  Commonly known as:  ELIQUIS     furosemide 40 MG tablet  Commonly known as:  LASIX      TAKE these medications       cephALEXin 500 MG capsule  Commonly known as:  KEFLEX  Take 1 capsule (500 mg total) by mouth 4 (four) times daily.     DECUBI-VITE Caps  Take 1 capsule by mouth daily. With meal     digoxin 0.125 MG tablet  Commonly known as:  LANOXIN  Take 0.125 mg by mouth daily.     diltiazem 180 MG 24 hr capsule  Commonly known as:  CARDIZEM CD  Take 1 capsule (180 mg total) by mouth daily.     doxycycline 100 MG tablet  Commonly known as:  VIBRA-TABS  Take 100 mg by mouth 2 (two) times daily. 30 day course started 02/05/2014     feeding supplement (GLUCERNA SHAKE) Liqd  Take 237 mLs by mouth 2 (two) times daily between meals.     ferrous fumarate 325 (106 FE) MG Tabs tablet  Commonly known as:  HEMOCYTE - 106 mg FE  Take 1 tablet (106 mg of iron total) by mouth daily at 12 noon.     Insulin Glargine 100 UNIT/ML Solostar Pen  Commonly known as:  LANTUS SOLOSTAR  Inject 20 Units into the skin at bedtime.     lisinopril 10 MG tablet  Commonly known as:  PRINIVIL,ZESTRIL  Take 10 mg by mouth daily.     LORazepam 0.5 MG tablet  Commonly known as:  ATIVAN  Take 0.5 tablets (0.25 mg total) by mouth every 12 (twelve) hours as needed for anxiety.      Melatonin 5 MG Tabs  Take 5 mg by mouth at bedtime as needed (insomnia).     NOVOLOG FLEXPEN 100 UNIT/ML FlexPen  Generic drug:  insulin aspart  Inject 0-10 Units into the skin 3 (three) times daily with meals. Per sliding scale:  cbg 150-250 5 units, 251-300 8 units, 301-500 10 units (call MD if over 350)     omeprazole 20 MG capsule  Commonly known as:  PRILOSEC  Take 20 mg by mouth  2 (two) times daily before a meal.     Oxycodone HCl 10 MG Tabs  Take 1 tablet (10 mg total) by mouth every 6 (six) hours as needed (pain).     silver sulfADIAZINE 1 % cream  Commonly known as:  SILVADENE  Apply 1 application topically daily.       Allergies  Allergen Reactions  . Morphine Rash and Other (See Comments)    Irritability also   Follow-up Information   Follow up with DUDA,MARCUS V, MD In 1 week.   Specialty:  Orthopedic Surgery   Contact information:   [place] [address] [phone]       Follow up with Georgetta Haber, MD. Schedule an appointment as soon as possible for a visit in 1 week.   Specialty:  Internal Medicine   Contact information:   [place] [address] [phone]        The results of significant diagnostics from this hospitalization (including imaging, microbiology, ancillary and laboratory) are listed below for reference.    Significant Diagnostic Studies: No results found.  Microbiology: No results found for this or any previous visit (from the past 240 hour(s)).   Labs: Basic Metabolic Panel:  Recent Labs Lab 03/07/14 1348 03/08/14 2145 03/09/14 0920  NA 136* 130* 135*  K 4.5 4.7 4.4  CL 98 93* 98  CO2 25 23 25   GLUCOSE 123* 183* 126*  BUN 28* 32* 29*  CREATININE 1.11 1.62* 1.40*  CALCIUM 9.6 8.7 8.8   Liver Function Tests: No results found for this basename: AST, ALT, ALKPHOS, BILITOT, PROT, ALBUMIN,  in the last 168 hours No results found for this basename: LIPASE, AMYLASE,  in  the last 168 hours No results found for this basename: AMMONIA,  in the last 168 hours CBC:  Recent Labs Lab 03/07/14 1348 03/08/14 2145 03/09/14 0920 03/10/14 1035 03/11/14 0850  WBC 9.3 11.9* 9.8  --  9.9  NEUTROABS  --  7.9*  --   --   --   HGB 9.5* 6.3* 8.3* 7.3* 9.3*  HCT 30.4* 19.7* 25.6* 21.9* 28.2*  MCV 81.7 81.7 81.8  --  83.7  PLT 249 236 193  --  182   Cardiac Enzymes: No results found for this basename: CKTOTAL, CKMB, CKMBINDEX, TROPONINI,  in the last 168 hours BNP: BNP (last 3 results)  Recent Labs  01/16/14 2043  PROBNP 3148.0*   CBG:  Recent Labs Lab 03/10/14 1154 03/10/14 1733 03/10/14 2150 03/11/14 0757 03/11/14 1215  GLUCAP 181* 178* 239* 151* 189*       Signed:  Jasen Hartstein  [facility] 03/11/2014, 12:47 PM

## 2014-03-11 NOTE — Clinical Social Work Note (Signed)
Per MD patient ready to DC to [facility]. RN, patient, and facility notified of DC. RN given number for report. DC packet on patient's chart. Ambulance transport requested for patient. CSW signing off at this time.    Liz Beach MSW, [place], [place], [phone]

## 2014-03-11 NOTE — Progress Notes (Signed)
INITIAL NUTRITION ASSESSMENT  DOCUMENTATION CODES Per approved criteria  -Not Applicable   INTERVENTION: Continue Glucerna Shake po BID. Encouraged continued adequate intake of meals. RD to continue to follow nutrition care plan.  NUTRITION DIAGNOSIS: Increased nutrient needs related to post-op healing as evidenced by estimated needs.   Goal: Intake to meet >90% of estimated nutrition needs.  Monitor:  Intake to meet >90% of estimated nutrition needs.  Reason for Assessment: Malnutrition Screening Tool  79 y.o. male  Admitting Dx: Anemia due to acute blood loss  ASSESSMENT: PMHx significant for PVD, DM2. Underwent recent R BKA revision. Admitted with bleeding from revision site. Work-up reveals ABLA.  Received PRBC this admit. Possible d/c today. Pt reports that he continues to lose weight, but he is eating as much as he can. Ordered for Glucerna Shake PO BID and drinking as scheduled. Per chart review, weight continues to decline, however pt has been having revisions to amputations. Overall, 3.5 % wt loss x 1 year, excluding BKA. Pt is at nutrition risk given decline in PO intake. Pt with moderate to severe muscle mass loss in bicep/tricep region and clavicle region.  Height: Ht Readings from Last 1 Encounters:  03/09/14 5\' 10"  (1.778 m)    Weight: Wt Readings from Last 1 Encounters:  03/09/14 163 lb 5.8 oz (74.1 kg)    Ideal Body Weight: 155 lb (adjusted for BKA)  % Ideal Body Weight: 105%  Wt Readings from Last 25 Encounters:  03/09/14 163 lb 5.8 oz (74.1 kg)  03/07/14 161 lb (73.029 kg)  03/07/14 161 lb (73.029 kg)  03/03/14 161 lb 13.1 oz (73.4 kg)  02/18/14 162 lb (73.483 kg)  02/18/14 160 lb (72.576 kg)  02/14/14 162 lb (73.483 kg)  02/14/14 162 lb (73.483 kg)  02/03/14 161 lb (73.029 kg)  01/28/14 161 lb (73.029 kg)  01/24/14 175 lb 14.8 oz (79.8 kg)  01/24/14 175 lb 14.8 oz (79.8 kg)  01/24/14 175 lb 14.8 oz (79.8 kg)  01/09/14 169 lb (76.658 kg)   12/06/13 176 lb (79.833 kg)  11/07/13 170 lb (77.111 kg)  10/30/13 180 lb (81.647 kg)  09/30/13 187 lb (84.823 kg)  09/02/13 185 lb (83.915 kg)  08/26/13 182 lb (82.555 kg)  06/17/13 177 lb (80.287 kg)  04/30/13 182 lb (82.555 kg)  04/09/13 187 lb (84.823 kg)  03/21/13 182 lb (82.555 kg)  03/11/13 184 lb (83.462 kg)   Usual Body Weight: 180 - 185 lb --> 1 year ago  % Usual Body Weight: 90% (this also includes 6.5% wt loss from BKA)  BMI:  25.1 (adjusted for BKA) - overweight  Estimated Nutritional Needs: Kcal: 1800 - 2000 Protein: 75 - 85 g Fluid: 1.8 - 2 liters  Skin:  Stage II L buttocks Closed R BKA incision  Diet Order:    EDUCATION NEEDS: -No education needs identified at this time   Intake/Output Summary (Last 24 hours) at 03/11/14 1116 Last data filed at 03/11/14 0859  Gross per 24 hour  Intake    942 ml  Output    515 ml  Net    427 ml    Last BM: 4/20  Labs:   Recent Labs Lab 03/07/14 1348 03/08/14 2145 03/09/14 0920  NA 136* 130* 135*  K 4.5 4.7 4.4  CL 98 93* 98  CO2 25 23 25   BUN 28* 32* 29*  CREATININE 1.11 1.62* 1.40*  CALCIUM 9.6 8.7 8.8  GLUCOSE 123* 183* 126*    CBG (last 3)  Recent Labs  03/10/14 1733 03/10/14 2150 03/11/14 0757  GLUCAP 178* 239* 151*    Scheduled Meds: . cephALEXin  500 mg Oral 3 times per day  . digoxin  0.125 mg Oral Daily  . diltiazem  180 mg Oral Daily  . feeding supplement (GLUCERNA SHAKE)  237 mL Oral BID BM  . ferrous fumarate  1 tablet Oral Q breakfast  . furosemide  40 mg Oral Daily  . insulin aspart  0-5 Units Subcutaneous QHS  . insulin aspart  0-9 Units Subcutaneous TID WC  . insulin glargine  20 Units Subcutaneous QHS  . lisinopril  10 mg Oral Daily  . multivitamin with minerals  1 tablet Oral Daily  . pantoprazole  40 mg Oral Daily  . sodium chloride  3 mL Intravenous Q12H    Continuous Infusions:   Past Medical History  Diagnosis Date  . Fatigue   . Overweight   . Syncope  and collapse   . GERD (gastroesophageal reflux disease)   . Right bundle branch block   . Unspecified venous (peripheral) insufficiency   . Phlebitis and thrombophlebitis of superficial vessels of lower extremities   . Cellulitis and abscess of leg, except foot   . Backache, unspecified   . Long term (current) use of anticoagulants   . Type II or unspecified type diabetes mellitus without mention of complication, not stated as uncontrolled   . Unspecified venous (peripheral) insufficiency   . Hypertension   . Gout, unspecified   . Heart failure   . Atrial fibrillation     permanent  . Esophageal stricture   . Hx of colonic polyps 05-2006    (Adenomatous)Dr. [name]  . Diverticulosis 05-2006    Dr. [name]   . Tachycardia-bradycardia     s/p PPM  . Pacemaker- [name] 02/20/2013  . CHF (congestive heart failure)     Past Surgical History  Procedure Laterality Date  . Laminectomy    . Lumbar fusion    . Rotator cuff repair    . Ruptured rt rectus muscle    . Eye surgery    . Pacemaker insertion  8/12    SJM by [name] for tachy/brady syndrome  . I&d extremity Right 01/17/2014    Procedure: IRRIGATION AND DEBRIDEMENT RIGHT HEEL  WITH CULTURES AND BONE BIOPSY, placement of wound vac;  Surgeon: Theodoro Kos, DO;  Location: [facility];  Service: Plastics;  Laterality: Right;  . Amputation Right 01/20/2014    Procedure: AMPUTATION BELOW KNEE- right;  Surgeon: [name], MD;  Location: [facility];  Service: Orthopedics;  Laterality: Right;  Right Below Knee Amputation  . Amputation Right 02/14/2014    Procedure: AMPUTATION BELOW KNEE;  Surgeon: [name], MD;  Location: [facility];  Service: Orthopedics;  Laterality: Right;  Right Below Knee Amputation Revision  . Amputation Right 03/07/2014    Procedure: AMPUTATION BELOW KNEE;  Surgeon: [name], MD;  Location: [facility];  Service: Orthopedics;  Laterality: Right;  Right Below Knee Amputation Revision    Inda Coke MS, RD,  LDN Inpatient Registered Dietitian Pager: [phone] After-hours pager: [phone]

## 2014-03-11 NOTE — Progress Notes (Addendum)
Pt prepared for d/c to SNF. IV d/c'd. Skin intact except as most recently charted. Vitals are stable. Report called to receiving facility. Pt to be transported by ambulance service. 

## 2014-03-12 ENCOUNTER — Other Ambulatory Visit: Payer: Self-pay | Admitting: *Deleted

## 2014-03-12 LAB — TYPE AND SCREEN
ABO/RH(D): B POS
Antibody Screen: POSITIVE
DAT, IgG: NEGATIVE
UNIT DIVISION: 0
UNIT DIVISION: 0
Unit division: 0
Unit division: 0

## 2014-03-12 MED ORDER — LORAZEPAM 0.5 MG PO TABS: ORAL_TABLET | ORAL | Status: DC

## 2014-03-12 MED ORDER — OXYCODONE HCL 10 MG PO TABS: 10.0000 mg | ORAL_TABLET | Freq: Four times a day (QID) | ORAL | Status: DC | PRN

## 2014-03-12 NOTE — Telephone Encounter (Signed)
[facility] [place] 

## 2014-03-12 NOTE — Telephone Encounter (Signed)
[place]

## 2014-03-14 ENCOUNTER — Non-Acute Institutional Stay (SKILLED_NURSING_FACILITY): Payer: Medicare Other | Admitting: Internal Medicine

## 2014-03-14 DIAGNOSIS — I10 Essential (primary) hypertension: Secondary | ICD-10-CM

## 2014-03-14 DIAGNOSIS — K5900 Constipation, unspecified: Secondary | ICD-10-CM

## 2014-03-14 DIAGNOSIS — D62 Acute posthemorrhagic anemia: Secondary | ICD-10-CM

## 2014-03-14 DIAGNOSIS — K219 Gastro-esophageal reflux disease without esophagitis: Secondary | ICD-10-CM

## 2014-03-14 DIAGNOSIS — E1159 Type 2 diabetes mellitus with other circulatory complications: Secondary | ICD-10-CM

## 2014-03-14 DIAGNOSIS — I872 Venous insufficiency (chronic) (peripheral): Secondary | ICD-10-CM

## 2014-03-14 DIAGNOSIS — M869 Osteomyelitis, unspecified: Secondary | ICD-10-CM

## 2014-03-14 DIAGNOSIS — M9080 Osteopathy in diseases classified elsewhere, unspecified site: Secondary | ICD-10-CM

## 2014-03-14 DIAGNOSIS — E1169 Type 2 diabetes mellitus with other specified complication: Secondary | ICD-10-CM

## 2014-03-14 DIAGNOSIS — S88119A Complete traumatic amputation at level between knee and ankle, unspecified lower leg, initial encounter: Secondary | ICD-10-CM

## 2014-03-14 DIAGNOSIS — I4891 Unspecified atrial fibrillation: Secondary | ICD-10-CM

## 2014-03-14 DIAGNOSIS — Z89511 Acquired absence of right leg below knee: Secondary | ICD-10-CM

## 2014-03-14 NOTE — Progress Notes (Signed)
Patient ID: [name], male   DOB: 1935/04/16, 79 y.o.   MRN: [id number]     [name] living [place]   PCP: Georgetta Haber, MD  Code Status: DNR  Allergies  Allergen Reactions  . Morphine Rash and Other (See Comments)    Irritability also    Chief Complaint: new admit  HPI:  79 y/o male patient is here for STR after hospital admission from 03/08/14- 03/10/14 with bleeding from his right knee amputation site. He got blood transfusion, eliquis was held and he has been set to our SNF for STR. He is seen in his room. He is getting wound care, working with therapy team and no active bleed from amputation site though some amount of bleeding persists. He would like his pain medication to be scheduled. He also has constipation. No other complaints  Review of Systems:  Constitutional: Negative for fever, chills, weight loss, malaise/fatigue and diaphoresis.  HENT: Negative for congestion, hearing loss and sore throat.   Eyes: Negative for eye pain, blurred vision, double vision and discharge.  Respiratory: Negative for cough, sputum production, shortness of breath and wheezing.   Cardiovascular: Negative for chest pain, palpitations, orthopnea and leg swelling.  Gastrointestinal: Negative for heartburn, nausea, vomiting, abdominal pain, diarrhea  Genitourinary: Negative for dysuria, urgency, frequency, hematuria and flank pain.  Musculoskeletal: Negative for back pain, falls, joint pain and myalgias.  Skin: Negative for itching and rash.  Neurological: Negative for dizziness, tingling, focal weakness and headaches.  Psychiatric/Behavioral: Negative for depression and memory loss. The patient is not nervous/anxious.     Past Medical History  Diagnosis Date  . Fatigue   . Overweight   . Syncope and collapse   . GERD (gastroesophageal reflux disease)   . Right bundle branch block   . Unspecified venous (peripheral) insufficiency   . Phlebitis and thrombophlebitis of  superficial vessels of lower extremities   . Cellulitis and abscess of leg, except foot   . Backache, unspecified   . Long term (current) use of anticoagulants   . Type II or unspecified type diabetes mellitus without mention of complication, not stated as uncontrolled   . Unspecified venous (peripheral) insufficiency   . Hypertension   . Gout, unspecified   . Heart failure   . Atrial fibrillation     permanent  . Esophageal stricture   . Hx of colonic polyps 05-2006    (Adenomatous)Dr. [name]  . Diverticulosis 05-2006    Dr. [name]   . Tachycardia-bradycardia     s/p PPM  . Pacemaker- [name] 02/20/2013  . CHF (congestive heart failure)    Past Surgical History  Procedure Laterality Date  . Laminectomy    . Lumbar fusion    . Rotator cuff repair    . Ruptured rt rectus muscle    . Eye surgery    . Pacemaker insertion  8/12    SJM by [name] for tachy/brady syndrome  . I&d extremity Right 01/17/2014    Procedure: IRRIGATION AND DEBRIDEMENT RIGHT HEEL  WITH CULTURES AND BONE BIOPSY, placement of wound vac;  Surgeon: Theodoro Kos, DO;  Location: [facility];  Service: Plastics;  Laterality: Right;  . Amputation Right 01/20/2014    Procedure: AMPUTATION BELOW KNEE- right;  Surgeon: [name], MD;  Location: [facility];  Service: Orthopedics;  Laterality: Right;  Right Below Knee Amputation  . Amputation Right 02/14/2014    Procedure: AMPUTATION BELOW KNEE;  Surgeon: [name], MD;  Location: [facility];  Service: Orthopedics;  Laterality: Right;  Right Below Knee Amputation Revision  . Amputation Right 03/07/2014    Procedure: AMPUTATION BELOW KNEE;  Surgeon: [name], MD;  Location: [facility];  Service: Orthopedics;  Laterality: Right;  Right Below Knee Amputation Revision   Social History:   reports that he has never smoked. He has never used smokeless tobacco. He reports that he does not drink alcohol or use illicit drugs.  Family History  Problem Relation Age of Onset  . Pneumonia  Mother   . Heart disease Father   . Diabetes Father   . Colon cancer Neg Hx     Medications: Patient's Medications  New Prescriptions   No medications on file  Previous Medications   CEPHALEXIN (KEFLEX) 500 MG CAPSULE    Take 1 capsule (500 mg total) by mouth 4 (four) times daily.   DIGOXIN (LANOXIN) 0.125 MG TABLET    Take 0.125 mg by mouth daily.   DILTIAZEM (CARDIZEM CD) 180 MG 24 HR CAPSULE    Take 1 capsule (180 mg total) by mouth daily.   DOXYCYCLINE (VIBRA-TABS) 100 MG TABLET    Take 100 mg by mouth 2 (two) times daily. 30 day course started 02/05/2014   FEEDING SUPPLEMENT, GLUCERNA SHAKE, (GLUCERNA SHAKE) LIQD    Take 237 mLs by mouth 2 (two) times daily between meals.   FERROUS FUMARATE (HEMOCYTE - 106 MG FE) 325 (106 FE) MG TABS TABLET    Take 1 tablet (106 mg of iron total) by mouth daily at 12 noon.   INSULIN ASPART (NOVOLOG FLEXPEN) 100 UNIT/ML FLEXPEN    Inject 0-10 Units into the skin 3 (three) times daily with meals. Per sliding scale:  cbg 150-250 5 units, 251-300 8 units, 301-500 10 units (call MD if over 350)   INSULIN GLARGINE (LANTUS SOLOSTAR) 100 UNIT/ML SOLOSTAR PEN    Inject 20 Units into the skin at bedtime.   LISINOPRIL (PRINIVIL,ZESTRIL) 10 MG TABLET    Take 10 mg by mouth daily.   LORAZEPAM (ATIVAN) 0.5 MG TABLET    Take one-half tablet by mouth every 12 hours as needed for anxiety   MELATONIN 5 MG TABS    Take 5 mg by mouth at bedtime as needed (insomnia).   MULTIPLE VITAMINS-MINERALS (DECUBI-VITE) CAPS    Take 1 capsule by mouth daily. With meal   OMEPRAZOLE (PRILOSEC) 20 MG CAPSULE    Take 20 mg by mouth 2 (two) times daily before a meal.   OXYCODONE HCL 10 MG TABS    Take 1 tablet (10 mg total) by mouth every 6 (six) hours as needed (pain).   SILVER SULFADIAZINE (SILVADENE) 1 % CREAM    Apply 1 application topically daily.  Modified Medications   No medications on file  Discontinued Medications   No medications on file     Physical Exam: Filed Vitals:     03/14/14 1424  BP: 140/66  Pulse: 82  Temp: 97.7 F (36.5 C)  Resp: 18  Height: 5\' 10"  (1.778 m)  Weight: 158 lb (71.668 kg)    General- elderly male in no acute distress Head- atraumatic, normocephalic Eyes- PERRLA, EOMI, no pallor, no icterus, no discharge Neck- no lymphadenopathy Cardiovascular- irregular hear rate, pacemaker in place, no murmurs/ rubs/ gallops Respiratory- bilateral clear to auscultation, no wheeze, no rhonchi, no crackles, no use of accessory muscles Abdomen- bowel sounds present, soft, non tender, no CVA tenderness Musculoskeletal- able to move all 3 extremities, right BKA, on wheelchair  Neurological-  no focal deficit Skin- warm and dry, incision site on right BKA site healing well, staples in place, mixed blood and serus drainage noted, erythema present Psychiatry- alert and oriented to person, place and time, normal mood and affect   Labs reviewed: Basic Metabolic Panel:  Recent Labs  03/07/14 1348 03/08/14 2145 03/09/14 0920  NA 136* 130* 135*  K 4.5 4.7 4.4  CL 98 93* 98  CO2 25 23 25   GLUCOSE 123* 183* 126*  BUN 28* 32* 29*  CREATININE 1.11 1.62* 1.40*  CALCIUM 9.6 8.7 8.8   Liver Function Tests:  Recent Labs  01/20/14 0537 02/14/14 1323 03/02/14 0710  AST 25 15 12   ALT 17 10 7   ALKPHOS 137* 125* 95  BILITOT 0.4 0.5 0.9  PROT 6.5 7.7 6.4  ALBUMIN 2.1* 2.8* 2.6*   No results found for this basename: LIPASE, AMYLASE,  in the last 8760 hours No results found for this basename: AMMONIA,  in the last 8760 hours CBC:  Recent Labs  01/20/14 0537  03/01/14 2123  03/08/14 2145 03/09/14 0920 03/10/14 1035 03/11/14 0850  WBC 5.6  < > 8.5  < > 11.9* 9.8  --  9.9  NEUTROABS 3.7  --  5.1  --  7.9*  --   --   --   HGB 8.4*  < > 6.4*  < > 6.3* 8.3* 7.3* 9.3*  HCT 24.8*  < > 20.5*  < > 19.7* 25.6* 21.9* 28.2*  MCV 82.4  < > 82.7  < > 81.7 81.8  --  83.7  PLT 214  < > 275  < > 236 193  --  182  < > = values in this interval not  displayed. Cardiac Enzymes: No results found for this basename: CKTOTAL, CKMB, CKMBINDEX, TROPONINI,  in the last 8760 hours BNP: No components found with this basename: POCBNP,  CBG:  Recent Labs  03/10/14 2150 03/11/14 0757 03/11/14 1215  GLUCAP 239* 151* 189*     Assessment/Plan  PVD- s/pRight BKA and amputation site healing well, monitor for blood loss. To work with PT and OT. Fall precautions. Change oxycodone to 10 mg po every 6 hours (hold for sedation). Continue wound care  Anemia- from blood loss from amputation site. Monitor h/h. Continue ferrous sulfate  Osteomyelitis- continue keflex and doxycycline for total 4 weeks. Monitor wbc and temp  constipation- will have him on senna-s 1 tab daily with miralax 17 g po daily prn for constipation  afib- rate controlled with diltiazem 180mg  daily and continue digoxin. eliquis on hold for now. Will resume it next week   Dm type 2- continue lantus and SSI novolog, monitor cbg  Hypertension- continue lisinopril 10 mg daily  GERD- continue prilosec 10 mg daily  Anxiety- continue ativan for now 0.5 mg bid prn   Family/ staff Communication: reviewed care plan with patient and nursing supervisor   Goals of care: STR   Labs/tests ordered: cbc, cmp    Blanchie Serve, MD  [place] [phone] (Monday-Friday 8 am - 5 pm) [phone] (afterhours)

## 2014-03-15 ENCOUNTER — Inpatient Hospital Stay (HOSPITAL_COMMUNITY)
Admission: EM | Admit: 2014-03-15 | Discharge: 2014-03-20 | DRG: 475 | Disposition: A | Discharge status: Skilled Nursing Facility | Payer: Medicare Other | Attending: Internal Medicine | Admitting: Internal Medicine

## 2014-03-15 ENCOUNTER — Encounter (HOSPITAL_COMMUNITY): Payer: Self-pay | Admitting: Emergency Medicine

## 2014-03-15 DIAGNOSIS — I5032 Chronic diastolic (congestive) heart failure: Secondary | ICD-10-CM | POA: Diagnosis present

## 2014-03-15 DIAGNOSIS — M869 Osteomyelitis, unspecified: Secondary | ICD-10-CM

## 2014-03-15 DIAGNOSIS — Z6829 Body mass index (BMI) 29.0-29.9, adult: Secondary | ICD-10-CM

## 2014-03-15 DIAGNOSIS — D62 Acute posthemorrhagic anemia: Secondary | ICD-10-CM | POA: Diagnosis present

## 2014-03-15 DIAGNOSIS — T8789 Other complications of amputation stump: Principal | ICD-10-CM | POA: Diagnosis present

## 2014-03-15 DIAGNOSIS — E663 Overweight: Secondary | ICD-10-CM | POA: Diagnosis present

## 2014-03-15 DIAGNOSIS — Z7901 Long term (current) use of anticoagulants: Secondary | ICD-10-CM

## 2014-03-15 DIAGNOSIS — Z950 Presence of cardiac pacemaker: Secondary | ICD-10-CM | POA: Diagnosis present

## 2014-03-15 DIAGNOSIS — M109 Gout, unspecified: Secondary | ICD-10-CM | POA: Diagnosis present

## 2014-03-15 DIAGNOSIS — K219 Gastro-esophageal reflux disease without esophagitis: Secondary | ICD-10-CM | POA: Diagnosis present

## 2014-03-15 DIAGNOSIS — I872 Venous insufficiency (chronic) (peripheral): Secondary | ICD-10-CM | POA: Diagnosis present

## 2014-03-15 DIAGNOSIS — T8740 Infection of amputation stump, unspecified extremity: Secondary | ICD-10-CM

## 2014-03-15 DIAGNOSIS — M7500 Adhesive capsulitis of unspecified shoulder: Secondary | ICD-10-CM

## 2014-03-15 DIAGNOSIS — S88119A Complete traumatic amputation at level between knee and ankle, unspecified lower leg, initial encounter: Secondary | ICD-10-CM

## 2014-03-15 DIAGNOSIS — T8131XA Disruption of external operation (surgical) wound, not elsewhere classified, initial encounter: Secondary | ICD-10-CM

## 2014-03-15 DIAGNOSIS — Y835 Amputation of limb(s) as the cause of abnormal reaction of the patient, or of later complication, without mention of misadventure at the time of the procedure: Secondary | ICD-10-CM | POA: Diagnosis present

## 2014-03-15 DIAGNOSIS — I509 Heart failure, unspecified: Secondary | ICD-10-CM

## 2014-03-15 DIAGNOSIS — I10 Essential (primary) hypertension: Secondary | ICD-10-CM | POA: Diagnosis present

## 2014-03-15 DIAGNOSIS — I4891 Unspecified atrial fibrillation: Secondary | ICD-10-CM | POA: Diagnosis present

## 2014-03-15 DIAGNOSIS — D649 Anemia, unspecified: Secondary | ICD-10-CM | POA: Diagnosis present

## 2014-03-15 DIAGNOSIS — L570 Actinic keratosis: Secondary | ICD-10-CM

## 2014-03-15 DIAGNOSIS — Z794 Long term (current) use of insulin: Secondary | ICD-10-CM

## 2014-03-15 DIAGNOSIS — T8130XA Disruption of wound, unspecified, initial encounter: Secondary | ICD-10-CM | POA: Diagnosis present

## 2014-03-15 DIAGNOSIS — E1169 Type 2 diabetes mellitus with other specified complication: Secondary | ICD-10-CM

## 2014-03-15 DIAGNOSIS — E119 Type 2 diabetes mellitus without complications: Secondary | ICD-10-CM | POA: Diagnosis present

## 2014-03-15 LAB — COMPREHENSIVE METABOLIC PANEL
ALT: 10 U/L (ref 0–53)
AST: 13 U/L (ref 0–37)
Albumin: 2.3 g/dL — ABNORMAL LOW (ref 3.5–5.2)
Alkaline Phosphatase: 122 U/L — ABNORMAL HIGH (ref 39–117)
BUN: 32 mg/dL — ABNORMAL HIGH (ref 6–23)
CALCIUM: 8.6 mg/dL (ref 8.4–10.5)
CO2: 20 mEq/L (ref 19–32)
Chloride: 97 mEq/L (ref 96–112)
Creatinine, Ser: 1.28 mg/dL (ref 0.50–1.35)
GFR, EST AFRICAN AMERICAN: 61 mL/min — AB (ref >90)
GFR, EST NON AFRICAN AMERICAN: 53 mL/min — AB (ref >90)
GLUCOSE: 258 mg/dL — AB (ref 70–99)
Potassium: 4.5 mEq/L (ref 3.7–5.3)
SODIUM: 133 meq/L — AB (ref 137–147)
Total Bilirubin: 0.4 mg/dL (ref 0.3–1.2)
Total Protein: 6.1 g/dL (ref 6.0–8.3)

## 2014-03-15 LAB — CBC WITH DIFFERENTIAL/PLATELET
BASOS ABS: 0 10*3/uL (ref 0.0–0.1)
BASOS PCT: 0 % (ref 0–1)
EOS ABS: 0.4 10*3/uL (ref 0.0–0.7)
EOS PCT: 3 % (ref 0–5)
HCT: 25 % — ABNORMAL LOW (ref 39.0–52.0)
HEMOGLOBIN: 8 g/dL — AB (ref 13.0–17.0)
LYMPHS PCT: 39 % (ref 12–46)
Lymphs Abs: 5.3 10*3/uL — ABNORMAL HIGH (ref 0.7–4.0)
MCH: 27.6 pg (ref 26.0–34.0)
MCHC: 32 g/dL (ref 30.0–36.0)
MCV: 86.2 fL (ref 78.0–100.0)
Monocytes Absolute: 0.9 10*3/uL (ref 0.1–1.0)
Monocytes Relative: 7 % (ref 3–12)
Neutro Abs: 7.1 10*3/uL (ref 1.7–7.7)
Neutrophils Relative %: 51 % (ref 43–77)
PLATELETS: 340 10*3/uL (ref 150–400)
RBC: 2.9 MIL/uL — AB (ref 4.22–5.81)
RDW: 15.5 % (ref 11.5–15.5)
WBC: 13.8 10*3/uL — ABNORMAL HIGH (ref 4.0–10.5)

## 2014-03-15 LAB — APTT: APTT: 44 s — AB (ref 24–37)

## 2014-03-15 LAB — GLUCOSE, CAPILLARY: Glucose-Capillary: 207 mg/dL — ABNORMAL HIGH (ref 70–99)

## 2014-03-15 LAB — PROTIME-INR
INR: 1.29 (ref 0.00–1.49)
Prothrombin Time: 15.8 seconds — ABNORMAL HIGH (ref 11.6–15.2)

## 2014-03-15 MED ORDER — GLUCERNA 1.2 CAL PO LIQD
237.0000 mL | Freq: Two times a day (BID) | ORAL | Status: DC
  Administered 2014-03-15 – 2014-03-16 (×3): 237 mL via ORAL
  Filled 2014-03-15 (×7): qty 237

## 2014-03-15 MED ORDER — ACETAMINOPHEN 650 MG RE SUPP: 650.0000 mg | Freq: Four times a day (QID) | RECTAL | Status: DC | PRN

## 2014-03-15 MED ORDER — ACETAMINOPHEN 325 MG PO TABS: 650.0000 mg | ORAL_TABLET | Freq: Four times a day (QID) | ORAL | Status: DC | PRN

## 2014-03-15 MED ORDER — INSULIN ASPART 100 UNIT/ML ~~LOC~~ SOLN
0.0000 [IU] | Freq: Three times a day (TID) | SUBCUTANEOUS | Status: DC
  Administered 2014-03-16 – 2014-03-17 (×4): 3 [IU] via SUBCUTANEOUS
  Administered 2014-03-17: 2 [IU] via SUBCUTANEOUS
  Administered 2014-03-17: 3 [IU] via SUBCUTANEOUS
  Administered 2014-03-19: 8 [IU] via SUBCUTANEOUS
  Administered 2014-03-20: 3 [IU] via SUBCUTANEOUS

## 2014-03-15 MED ORDER — SENNOSIDES-DOCUSATE SODIUM 8.6-50 MG PO TABS
1.0000 | ORAL_TABLET | Freq: Every day | ORAL | Status: DC
  Administered 2014-03-16 – 2014-03-20 (×4): 1 via ORAL
  Filled 2014-03-15 (×4): qty 1

## 2014-03-15 MED ORDER — LORAZEPAM 0.5 MG PO TABS: 0.2500 mg | ORAL_TABLET | Freq: Two times a day (BID) | ORAL | Status: DC | PRN

## 2014-03-15 MED ORDER — HYDROMORPHONE HCL PF 1 MG/ML IJ SOLN
0.5000 mg | Freq: Once | INTRAMUSCULAR | Status: AC
  Administered 2014-03-15: 0.5 mg via INTRAVENOUS
  Filled 2014-03-15: qty 1

## 2014-03-15 MED ORDER — MELATONIN 5 MG PO TABS: 5.0000 mg | ORAL_TABLET | Freq: Every evening | ORAL | Status: DC | PRN

## 2014-03-15 MED ORDER — ONDANSETRON HCL 4 MG PO TABS: 4.0000 mg | ORAL_TABLET | Freq: Four times a day (QID) | ORAL | Status: DC | PRN

## 2014-03-15 MED ORDER — PANTOPRAZOLE SODIUM 40 MG PO TBEC
40.0000 mg | DELAYED_RELEASE_TABLET | Freq: Every day | ORAL | Status: DC
Start: 2014-03-16 — End: 2014-03-20
  Administered 2014-03-16 – 2014-03-20 (×5): 40 mg via ORAL
  Filled 2014-03-15 (×5): qty 1

## 2014-03-15 MED ORDER — SODIUM CHLORIDE 0.9 % IJ SOLN
3.0000 mL | Freq: Two times a day (BID) | INTRAMUSCULAR | Status: DC
  Administered 2014-03-15 – 2014-03-16 (×3): 3 mL via INTRAVENOUS

## 2014-03-15 MED ORDER — FERROUS FUMARATE 325 (106 FE) MG PO TABS
1.0000 | ORAL_TABLET | Freq: Every day | ORAL | Status: DC
  Administered 2014-03-16 – 2014-03-20 (×4): 106 mg via ORAL
  Filled 2014-03-15 (×5): qty 1

## 2014-03-15 MED ORDER — DIGOXIN 125 MCG PO TABS
0.1250 mg | ORAL_TABLET | Freq: Every day | ORAL | Status: DC
  Administered 2014-03-16 – 2014-03-20 (×5): 0.125 mg via ORAL
  Filled 2014-03-15 (×5): qty 1

## 2014-03-15 MED ORDER — INSULIN ASPART 100 UNIT/ML ~~LOC~~ SOLN
0.0000 [IU] | Freq: Every day | SUBCUTANEOUS | Status: DC
  Administered 2014-03-15: 2 [IU] via SUBCUTANEOUS

## 2014-03-15 MED ORDER — DILTIAZEM HCL ER COATED BEADS 180 MG PO CP24
180.0000 mg | ORAL_CAPSULE | Freq: Every day | ORAL | Status: DC
  Administered 2014-03-15 – 2014-03-20 (×6): 180 mg via ORAL
  Filled 2014-03-15 (×6): qty 1

## 2014-03-15 MED ORDER — PIPERACILLIN-TAZOBACTAM 3.375 G IVPB 30 MIN
3.3750 g | Freq: Once | INTRAVENOUS | Status: AC
  Administered 2014-03-15: 3.375 g via INTRAVENOUS

## 2014-03-15 MED ORDER — PIPERACILLIN-TAZOBACTAM 3.375 G IVPB
3.3750 g | Freq: Three times a day (TID) | INTRAVENOUS | Status: DC
  Administered 2014-03-16 – 2014-03-20 (×13): 3.375 g via INTRAVENOUS
  Filled 2014-03-15 (×17): qty 50

## 2014-03-15 MED ORDER — ONDANSETRON HCL 4 MG/2ML IJ SOLN: 4.0000 mg | Freq: Four times a day (QID) | INTRAMUSCULAR | Status: DC | PRN

## 2014-03-15 MED ORDER — INSULIN GLARGINE 100 UNIT/ML ~~LOC~~ SOLN
20.0000 [IU] | Freq: Every day | SUBCUTANEOUS | Status: DC
  Administered 2014-03-15 – 2014-03-19 (×5): 20 [IU] via SUBCUTANEOUS
  Filled 2014-03-15 (×6): qty 0.2

## 2014-03-15 MED ORDER — FUROSEMIDE 40 MG PO TABS
40.0000 mg | ORAL_TABLET | Freq: Every day | ORAL | Status: DC
  Administered 2014-03-16 – 2014-03-19 (×4): 40 mg via ORAL
  Filled 2014-03-15 (×4): qty 1

## 2014-03-15 MED ORDER — OXYCODONE HCL 5 MG PO TABS
10.0000 mg | ORAL_TABLET | Freq: Four times a day (QID) | ORAL | Status: DC
  Administered 2014-03-15 – 2014-03-19 (×11): 10 mg via ORAL
  Filled 2014-03-15 (×27): qty 2

## 2014-03-15 MED ORDER — LISINOPRIL 10 MG PO TABS
10.0000 mg | ORAL_TABLET | Freq: Every day | ORAL | Status: DC
  Administered 2014-03-17 – 2014-03-20 (×3): 10 mg via ORAL
  Filled 2014-03-15 (×5): qty 1

## 2014-03-15 MED ORDER — DOXYCYCLINE HYCLATE 100 MG PO TABS
100.0000 mg | ORAL_TABLET | Freq: Two times a day (BID) | ORAL | Status: DC
  Administered 2014-03-16: 100 mg via ORAL
  Filled 2014-03-15 (×3): qty 1

## 2014-03-15 NOTE — ED Provider Notes (Signed)
CSN: [id number]     Arrival date & time 03/15/14  2008 History   First MD Initiated Contact with Patient 03/15/14 2016     Chief Complaint  Patient presents with  . Coagulation Disorder     (Consider location/radiation/quality/duration/timing/severity/associated sxs/prior Treatment) The history is provided by the patient.   history of present illness: 79 year old male who presents with chief complaint of bleeding. Patient is status post a right BKA. Today he was sitting on the toilet when he had a large gush of blood come from the wound. On EMS arrival they estimated 200-300 cc of blood on the floor. He has had a wound dehiscence with revision last week. He was previously on Eliquis but this was stopped with his previous wound dehiscence.  Past Medical History  Diagnosis Date  . Fatigue   . Overweight   . Syncope and collapse   . GERD (gastroesophageal reflux disease)   . Right bundle branch block   . Unspecified venous (peripheral) insufficiency   . Phlebitis and thrombophlebitis of superficial vessels of lower extremities   . Cellulitis and abscess of leg, except foot   . Backache, unspecified   . Long term (current) use of anticoagulants   . Type II or unspecified type diabetes mellitus without mention of complication, not stated as uncontrolled   . Unspecified venous (peripheral) insufficiency   . Hypertension   . Gout, unspecified   . Heart failure   . Atrial fibrillation     permanent  . Esophageal stricture   . Hx of colonic polyps 05-2006    (Adenomatous)Dr. [name]  . Diverticulosis 05-2006    Dr. [name]   . Tachycardia-bradycardia     s/p PPM  . Pacemaker- [name] 02/20/2013  . CHF (congestive heart failure)    Past Surgical History  Procedure Laterality Date  . Laminectomy    . Lumbar fusion    . Rotator cuff repair    . Ruptured rt rectus muscle    . Eye surgery    . Pacemaker insertion  8/12    SJM by [name] for tachy/brady syndrome  . I&d extremity  Right 01/17/2014    Procedure: IRRIGATION AND DEBRIDEMENT RIGHT HEEL  WITH CULTURES AND BONE BIOPSY, placement of wound vac;  Surgeon: Theodoro Kos, DO;  Location: [place];  Service: Plastics;  Laterality: Right;  . Amputation Right 01/20/2014    Procedure: AMPUTATION BELOW KNEE- right;  Surgeon: [name], MD;  Location: [place];  Service: Orthopedics;  Laterality: Right;  Right Below Knee Amputation  . Amputation Right 02/14/2014    Procedure: AMPUTATION BELOW KNEE;  Surgeon: [name], MD;  Location: [place];  Service: Orthopedics;  Laterality: Right;  Right Below Knee Amputation Revision  . Amputation Right 03/07/2014    Procedure: AMPUTATION BELOW KNEE;  Surgeon: [name], MD;  Location: [place];  Service: Orthopedics;  Laterality: Right;  Right Below Knee Amputation Revision   Family History  Problem Relation Age of Onset  . Pneumonia Mother   . Heart disease Father   . Diabetes Father   . Colon cancer Neg Hx    History  Substance Use Topics  . Smoking status: Never Smoker   . Smokeless tobacco: Never Used  . Alcohol Use: No     Comment: two drinks daily - not while at [facility]  Constitutional: Negative for fever and chills.  HENT: Negative.   Eyes: Negative.   Respiratory:  Negative for cough and shortness of breath.   Cardiovascular: Negative for chest pain and palpitations.  Gastrointestinal: Negative for nausea, vomiting, abdominal pain, diarrhea and constipation.  Genitourinary: Negative.   Musculoskeletal: Negative.   Skin: Positive for wound.  Neurological: Negative.  Negative for light-headedness.  Hematological: Bruises/bleeds easily.  All other systems reviewed and are negative.     Allergies  Morphine  Home Medications   Prior to Admission medications   Medication Sig Start Date End Date Taking? Authorizing Provider  cephALEXin (KEFLEX) 500 MG capsule Take 1 capsule (500 mg total) by mouth 4 (four) times daily. 02/06/14    Truman Hayward, MD  digoxin (LANOXIN) 0.125 MG tablet Take 0.125 mg by mouth daily.    Historical Provider, MD  diltiazem (CARDIZEM CD) 180 MG 24 hr capsule Take 1 capsule (180 mg total) by mouth daily. 10/01/13   Ricard Dillon, MD  doxycycline (VIBRA-TABS) 100 MG tablet Take 100 mg by mouth 2 (two) times daily. 30 day course started 02/05/2014    Historical Provider, MD  feeding supplement, GLUCERNA SHAKE, (GLUCERNA SHAKE) LIQD Take 237 mLs by mouth 2 (two) times daily between meals. 03/03/14   Barton Dubois, MD  ferrous fumarate (HEMOCYTE - 106 MG FE) 325 (106 FE) MG TABS tablet Take 1 tablet (106 mg of iron total) by mouth daily at 12 noon. 03/03/14   Barton Dubois, MD  insulin aspart (NOVOLOG FLEXPEN) 100 UNIT/ML FlexPen Inject 0-10 Units into the skin 3 (three) times daily with meals. Per sliding scale:  cbg 150-250 5 units, 251-300 8 units, 301-500 10 units (call MD if over 350)    Historical Provider, MD  Insulin Glargine (LANTUS SOLOSTAR) 100 UNIT/ML Solostar Pen Inject 20 Units into the skin at bedtime. 03/03/14   Barton Dubois, MD  lisinopril (PRINIVIL,ZESTRIL) 10 MG tablet Take 10 mg by mouth daily. 01/24/14   Verlee Monte, MD  LORazepam (ATIVAN) 0.5 MG tablet Take one-half tablet by mouth every 12 hours as needed for anxiety 03/12/14   Pricilla Larsson, NP  Melatonin 5 MG TABS Take 5 mg by mouth at bedtime as needed (insomnia).    Historical Provider, MD  Multiple Vitamins-Minerals (DECUBI-VITE) CAPS Take 1 capsule by mouth daily. With meal    Historical Provider, MD  omeprazole (PRILOSEC) 20 MG capsule Take 20 mg by mouth 2 (two) times daily before a meal.    Historical Provider, MD  Oxycodone HCl 10 MG TABS Take 1 tablet (10 mg total) by mouth every 6 (six) hours as needed (pain). 03/12/14   Pricilla Larsson, NP  silver sulfADIAZINE (SILVADENE) 1 % cream Apply 1 application topically daily.    Historical Provider, MD   BP 127/62  Pulse 92  Temp(Src) 98.2 F (36.8 C) (Oral)  Resp 18  Ht  5' 1.2" (1.554 m)  Wt 158 lb (71.668 kg)  BMI 29.68 kg/m2  SpO2 95% Physical Exam  Nursing note and vitals reviewed. Constitutional: He is oriented to person, place, and time. He appears well-developed and well-nourished. No distress.  HENT:  Head: Normocephalic and atraumatic.  Eyes: Conjunctivae are normal.  Neck: Neck supple.  Cardiovascular: Normal rate, regular rhythm, normal heart sounds and intact distal pulses.   Pulmonary/Chest: Effort normal and breath sounds normal. He has no wheezes. He has no rales.  Abdominal: Soft. He exhibits no distension. There is no tenderness.  Musculoskeletal: Normal range of motion.  RLE s/p BKA with wound dehiscence.  No active bleeding.  Neurological: He  is alert and oriented to person, place, and time.  Skin: Skin is warm and dry.    ED Course  Procedures (including critical care time) Labs Review Labs Reviewed  COMPREHENSIVE METABOLIC PANEL - Abnormal; Notable for the following:    Sodium 133 (*)    Glucose, Bld 258 (*)    BUN 32 (*)    Albumin 2.3 (*)    Alkaline Phosphatase 122 (*)    GFR calc non Af Amer 53 (*)    GFR calc Af Amer 61 (*)    All other components within normal limits  CBC WITH DIFFERENTIAL - Abnormal; Notable for the following:    WBC 13.8 (*)    RBC 2.90 (*)    Hemoglobin 8.0 (*)    HCT 25.0 (*)    Lymphs Abs 5.3 (*)    All other components within normal limits  APTT - Abnormal; Notable for the following:    aPTT 44 (*)    All other components within normal limits  PROTIME-INR - Abnormal; Notable for the following:    Prothrombin Time 15.8 (*)    All other components within normal limits  GLUCOSE, CAPILLARY - Abnormal; Notable for the following:    Glucose-Capillary 207 (*)    All other components within normal limits  CULTURE, BLOOD (ROUTINE X 2)  CULTURE, BLOOD (ROUTINE X 2)  MRSA PCR SCREENING  SEDIMENTATION RATE  C-REACTIVE PROTEIN  TYPE AND SCREEN    Imaging Review No results found.   EKG  Interpretation None      MDM   Final diagnoses:  Wound dehiscence  Anemia   79 year old male with history of CHF, A. fib, diabetes who presents for wound dehiscence at the site of his right BKA. No active bleeding at time of exam. AF VSS. He was given Dilaudid for pain.  Labs remarkable for hemoglobin of 8 down from 9.34 days ago.  Patient discussed with patient's orthopedic surgeon, Dr. Sharol Given, who recommended admission to internal medicine due to patient's multiple medical problems. He will follow with plan for revision.  Hospitalist consulted and will admit for further management.     Renaldo Reel, MD 03/16/14 0040

## 2014-03-15 NOTE — ED Notes (Signed)
Pt recently has had rt side below the knee amputation 8- 10 days ago, and 30 minutes ago pt started bleeding at site. EMS reports 200-300 cc of blood on floor.

## 2014-03-15 NOTE — ED Notes (Signed)
Patel MD at bedside.

## 2014-03-15 NOTE — ED Notes (Signed)
Beaver MD at bedside. 

## 2014-03-15 NOTE — Progress Notes (Signed)
79yo male s/p recent BKA c/o bleeding from wound, has h/o Pseudomonas and klebsiella w/ recurrent wound dehiscence, to begin IV ABX.  Will start Zosyn 3.375g IV Q8H for CrCl ~50 ml/min and monitor CBC and Cx.  Wynona Neat, PharmD, BCPS 03/15/2014 10:41 PM

## 2014-03-15 NOTE — H&P (Signed)
[facility] History and Physical  Patient: [name]  HUT:[id number]  DOB: 02-22-1935  DOS: the patient was seen and examined on 03/15/2014 PCP: Georgetta Haber, MD  Chief Complaint: Bleeding from the wound  HPI: [name] is a 79 y.o. male with Past medical history of diabetes mellitus, Pseudomonas osteomyelitis, tachybradycardia syndrome status post pacemaker implant, hypertension, A. fib was on anticoagulation, recent 2 transtibial amputation and recurrent wound dehiscence. The patient presented with complaints of bleeding from his wound. He mentions that after his recent discharge he was doing okay, he had physical therapy today which involved a new exercise earlier in the morning, around evening when the patient was having a bowel movement, he heard a pop and found that his wound was bleeding significantly. EMS reported 200-300 cc of blood loss. This was followed by fatigue, dizziness and tiredness. EMS was called in and the patient was brought here. The bleeding stopped with local pressure at present patient is not having any bleeding. Pt denies any fever, chills, headache, cough, chest pain, palpitation, shortness of breath, orthopnea, PND, nausea, vomiting, abdominal pain, diarrhea burning urination, dizziness, pedal edema,  focal neurological deficit.  He complains of diarrhea. He mentions he is eating and drinking okay. He denies getting any anticoagulation in the nursing home.  The patient is coming from SNF. And at his baseline independent for most of his ADL.  Review of Systems: as mentioned in the history of present illness.  A Comprehensive review of the other systems is negative.  Past Medical History  Diagnosis Date  . Fatigue   . Overweight   . Syncope and collapse   . GERD (gastroesophageal reflux disease)   . Right bundle branch block   . Unspecified venous (peripheral) insufficiency   . Phlebitis and thrombophlebitis of superficial vessels of  lower extremities   . Cellulitis and abscess of leg, except foot   . Backache, unspecified   . Long term (current) use of anticoagulants   . Type II or unspecified type diabetes mellitus without mention of complication, not stated as uncontrolled   . Unspecified venous (peripheral) insufficiency   . Hypertension   . Gout, unspecified   . Heart failure   . Atrial fibrillation     permanent  . Esophageal stricture   . Hx of colonic polyps 05-2006    (Adenomatous)Dr. [name]  . Diverticulosis 05-2006    Dr. [name]   . Tachycardia-bradycardia     s/p PPM  . Pacemaker- [name] 02/20/2013  . CHF (congestive heart failure)    Past Surgical History  Procedure Laterality Date  . Laminectomy    . Lumbar fusion    . Rotator cuff repair    . Ruptured rt rectus muscle    . Eye surgery    . Pacemaker insertion  8/12    SJM by [name] for tachy/brady syndrome  . I&d extremity Right 01/17/2014    Procedure: IRRIGATION AND DEBRIDEMENT RIGHT HEEL  WITH CULTURES AND BONE BIOPSY, placement of wound vac;  Surgeon: Theodoro Kos, DO;  Location: [place];  Service: Plastics;  Laterality: Right;  . Amputation Right 01/20/2014    Procedure: AMPUTATION BELOW KNEE- right;  Surgeon: [name], MD;  Location: [place];  Service: Orthopedics;  Laterality: Right;  Right Below Knee Amputation  . Amputation Right 02/14/2014    Procedure: AMPUTATION BELOW KNEE;  Surgeon: [name], MD;  Location: [place];  Service: Orthopedics;  Laterality: Right;  Right Below Knee  Amputation Revision  . Amputation Right 03/07/2014    Procedure: AMPUTATION BELOW KNEE;  Surgeon: [name], MD;  Location: [place];  Service: Orthopedics;  Laterality: Right;  Right Below Knee Amputation Revision   Social History:  reports that he has never smoked. He has never used smokeless tobacco. He reports that he does not drink alcohol or use illicit drugs.  Allergies  Allergen Reactions  . Morphine Rash and Other (See Comments)     Irritability also    Family History  Problem Relation Age of Onset  . Pneumonia Mother   . Heart disease Father   . Diabetes Father   . Colon cancer Neg Hx     Prior to Admission medications   Medication Sig Start Date End Date Taking? Authorizing Provider  cephALEXin (KEFLEX) 500 MG capsule Take 500 mg by mouth every 6 (six) hours.   Yes Historical Provider, MD  digoxin (LANOXIN) 0.125 MG tablet Take 0.125 mg by mouth daily.   Yes Historical Provider, MD  diltiazem (CARDIZEM CD) 180 MG 24 hr capsule Take 1 capsule (180 mg total) by mouth daily. 10/01/13  Yes Ricard Dillon, MD  doxycycline (VIBRA-TABS) 100 MG tablet Take 100 mg by mouth 2 (two) times daily.    Yes Historical Provider, MD  ferrous fumarate (HEMOCYTE - 106 MG FE) 325 (106 FE) MG TABS tablet Take 1 tablet (106 mg of iron total) by mouth daily at 12 noon. 03/03/14  Yes Barton Dubois, MD  furosemide (LASIX) 40 MG tablet Take 40 mg by mouth daily.  12/28/13  Yes Historical Provider, MD  insulin aspart (NOVOLOG FLEXPEN) 100 UNIT/ML FlexPen Inject 0-10 Units into the skin 3 (three) times daily with meals. Per sliding scale:  cbg 150-250 5 units, 251-300 8 units, 301-500 10 units (call MD if over 350)   Yes Historical Provider, MD  Insulin Glargine (LANTUS SOLOSTAR) 100 UNIT/ML Solostar Pen Inject 20 Units into the skin at bedtime. 03/03/14  Yes Barton Dubois, MD  lisinopril (PRINIVIL,ZESTRIL) 10 MG tablet Take 10 mg by mouth daily. 01/24/14  Yes Verlee Monte, MD  LORazepam (ATIVAN) 0.5 MG tablet Take 0.25 mg by mouth 2 (two) times daily as needed for anxiety.   Yes Historical Provider, MD  Melatonin 5 MG TABS Take 5 mg by mouth at bedtime as needed (insomnia).   Yes Historical Provider, MD  Multiple Vitamins-Minerals (DECUBI-VITE) CAPS Take 1 capsule by mouth daily.    Yes Historical Provider, MD  Nutritional Supplements (FEEDING SUPPLEMENT, GLUCERNA 1.2 CAL,) LIQD Take by mouth 2 (two) times daily.   Yes Historical Provider, MD   omeprazole (PRILOSEC) 20 MG capsule Take 20 mg by mouth 2 (two) times daily before a meal.   Yes Historical Provider, MD  Oxycodone HCl 10 MG TABS Take 10 mg by mouth every 6 (six) hours. Scheduled (hold for sedation)   Yes Historical Provider, MD  senna-docusate (SENNA-S) 8.6-50 MG per tablet Take 1 tablet by mouth daily.   Yes Historical Provider, MD  silver sulfADIAZINE (SILVADENE) 1 % cream Apply 1 application topically daily.   Yes Historical Provider, MD    Physical Exam: Filed Vitals:   03/15/14 2017 03/15/14 2030 03/15/14 2100 03/15/14 2130  BP: 124/44 147/51 109/41 114/41  Pulse: 94 93 89 89  Temp:      Resp: _0 SpO2: 100% 100% 100% 100%    General: Alert, Awake and Oriented to Time, Place and Person. Appear in mild distress Eyes: PERRL  ENT: Oral Mucosa clear moist. Neck: No JVD Cardiovascular: S1 and S2 Present, no Murmur, Peripheral Pulses Present Respiratory: Bilateral Air entry equal and Decreased, Clear to Auscultation,  No Crackles, no wheezes Abdomen: Bowel Sound Present, Soft and Non tender Skin: No Rash Extremities: Right leg below-knee amputation, open wound with swelling, warmth and redness of the wound edges, no pus identified, left leg no Pedal edema, no calf tenderness Neurologic: Grossly no focal neuro deficit.  Labs on Admission:  CBC:  Recent Labs Lab 03/09/14 0920 03/10/14 1035 03/11/14 0850 03/15/14 2019  WBC 9.8  --  9.9 13.8*  NEUTROABS  --   --   --  7.1  HGB 8.3* 7.3* 9.3* 8.0*  HCT 25.6* 21.9* 28.2* 25.0*  MCV 81.8  --  83.7 86.2  PLT 193  --  182 340    CMP     Component Value Date/Time   NA 133* 03/15/2014 2019   K 4.5 03/15/2014 2019   CL 97 03/15/2014 2019   CO2 20 03/15/2014 2019   GLUCOSE 258* 03/15/2014 2019   GLUCOSE 358* 10/26/2006 1158   BUN 32* 03/15/2014 2019   CREATININE 1.28 03/15/2014 2019   CALCIUM 8.6 03/15/2014 2019   PROT 6.1 03/15/2014 2019   ALBUMIN 2.3* 03/15/2014 2019   AST 13 03/15/2014 2019   ALT 10  03/15/2014 2019   ALKPHOS 122* 03/15/2014 2019   BILITOT 0.4 03/15/2014 2019   GFRNONAA 53* 03/15/2014 2019   GFRAA 61* 03/15/2014 2019    No results found for this basename: LIPASE, AMYLASE,  in the last 168 hours No results found for this basename: AMMONIA,  in the last 168 hours  No results found for this basename: CKTOTAL, CKMB, CKMBINDEX, TROPONINI,  in the last 168 hours BNP (last 3 results)  Recent Labs  01/16/14 2043  PROBNP 3148.0*    Radiological Exams on Admission: No results found.  EKG: Independently reviewed. atrial fibrillation, rate controlled.  Assessment/Plan Principal Problem:   Wound dehiscence Active Problems:   HYPERTENSION   Atrial fibrillation   Chronic diastolic CHF (congestive heart failure)   GERD   Pacemaker- [name]   Anemia   Diabetes mellitus   1. Wound dehiscence The patient is presenting with episode of wound dehiscence. He has multiple episodes of wound dehiscence from his initial surgery with multiple episodes of bleeding from the site. Orthopedics has been consulted who will be following the patient and patient may be taken to the OR on Monday for closure of the wound. Currently the site appears red and warm and swollen, no pus identified. 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 His prior Pseudomonas and Klebsiella both were sensitive to Zosyn, he was treated with ciprofloxacin for long-term prior to BKA. Get blood cultures, ESR and CRP. Not a good candidate for MRI as has pacemaker as well as recent surgery.  2. Diabetes mellitus Continue Lantus and place on sliding scale  3. Chronic diastolic dysfunction A. Fib Continue  digoxin, Cardizem, Lasix Continue holding anticoagulation and antiplatelet medication in view of recurrent bleeding from the wound.  4. Chronic pain management Continue OxyIR  Consults: Orthopedics, may need ID  DVT Prophylaxis: mechanical compression device Nutrition: Cardiac and diabetic  diet  Code Status: Full, patient had a signed DNR/DNI paper but he does not want to be DNR/DNI, he wants to be resuscitated and intubated if needed. His family was present at the time of this discussion and patient appears in sound mind to make the decision.  Family Communication: Family was present at bedside, opportunity was given to ask question and all questions were answered satisfactorily at the time of interview. Disposition: Admitted to inpatient in telemetry unit.  Author: Berle Mull, MD [facility] Pager: [phone] 03/15/2014, 10:33 PM    If 7PM-7AM, please contact night-coverage [email or web address] Password [email or web address]

## 2014-03-16 ENCOUNTER — Encounter (HOSPITAL_COMMUNITY): Payer: Self-pay | Admitting: *Deleted

## 2014-03-16 LAB — COMPREHENSIVE METABOLIC PANEL
ALBUMIN: 2.2 g/dL — AB (ref 3.5–5.2)
ALT: 9 U/L (ref 0–53)
AST: 12 U/L (ref 0–37)
Alkaline Phosphatase: 98 U/L (ref 39–117)
BUN: 37 mg/dL — ABNORMAL HIGH (ref 6–23)
CHLORIDE: 101 meq/L (ref 96–112)
CO2: 22 mEq/L (ref 19–32)
CREATININE: 1.37 mg/dL — AB (ref 0.50–1.35)
Calcium: 8.2 mg/dL — ABNORMAL LOW (ref 8.4–10.5)
GFR calc Af Amer: 56 mL/min — ABNORMAL LOW (ref >90)
GFR calc non Af Amer: 49 mL/min — ABNORMAL LOW (ref >90)
Glucose, Bld: 160 mg/dL — ABNORMAL HIGH (ref 70–99)
Potassium: 5 mEq/L (ref 3.7–5.3)
Sodium: 135 mEq/L — ABNORMAL LOW (ref 137–147)
TOTAL PROTEIN: 5.5 g/dL — AB (ref 6.0–8.3)
Total Bilirubin: 0.4 mg/dL (ref 0.3–1.2)

## 2014-03-16 LAB — CBC WITH DIFFERENTIAL/PLATELET
BASOS ABS: 0 10*3/uL (ref 0.0–0.1)
BASOS PCT: 0 % (ref 0–1)
Eosinophils Absolute: 0.2 10*3/uL (ref 0.0–0.7)
Eosinophils Relative: 2 % (ref 0–5)
HEMATOCRIT: 19.2 % — AB (ref 39.0–52.0)
Hemoglobin: 6.4 g/dL — CL (ref 13.0–17.0)
Lymphocytes Relative: 20 % (ref 12–46)
Lymphs Abs: 1.9 10*3/uL (ref 0.7–4.0)
MCH: 28.8 pg (ref 26.0–34.0)
MCHC: 33.3 g/dL (ref 30.0–36.0)
MCV: 86.5 fL (ref 78.0–100.0)
MONO ABS: 0.7 10*3/uL (ref 0.1–1.0)
Monocytes Relative: 8 % (ref 3–12)
NEUTROS ABS: 6.4 10*3/uL (ref 1.7–7.7)
Neutrophils Relative %: 69 % (ref 43–77)
Platelets: 238 10*3/uL (ref 150–400)
RBC: 2.22 MIL/uL — ABNORMAL LOW (ref 4.22–5.81)
RDW: 15.7 % — AB (ref 11.5–15.5)
WBC: 9.3 10*3/uL (ref 4.0–10.5)

## 2014-03-16 LAB — GLUCOSE, CAPILLARY
GLUCOSE-CAPILLARY: 179 mg/dL — AB (ref 70–99)
Glucose-Capillary: 162 mg/dL — ABNORMAL HIGH (ref 70–99)
Glucose-Capillary: 160 mg/dL — ABNORMAL HIGH (ref 70–99)
Glucose-Capillary: 162 mg/dL — ABNORMAL HIGH (ref 70–99)

## 2014-03-16 LAB — CBC
HCT: 24.5 % — ABNORMAL LOW (ref 39.0–52.0)
Hemoglobin: 8.4 g/dL — ABNORMAL LOW (ref 13.0–17.0)
MCH: 29.1 pg (ref 26.0–34.0)
MCHC: 34.3 g/dL (ref 30.0–36.0)
MCV: 84.8 fL (ref 78.0–100.0)
Platelets: 207 10*3/uL (ref 150–400)
RBC: 2.89 MIL/uL — ABNORMAL LOW (ref 4.22–5.81)
RDW: 15 % (ref 11.5–15.5)
WBC: 8.4 10*3/uL (ref 4.0–10.5)

## 2014-03-16 LAB — PREPARE RBC (CROSSMATCH)

## 2014-03-16 LAB — C-REACTIVE PROTEIN: CRP: 4.7 mg/dL — AB (ref <0.60)

## 2014-03-16 LAB — MRSA PCR SCREENING: MRSA BY PCR: NEGATIVE

## 2014-03-16 LAB — SEDIMENTATION RATE: Sed Rate: 88 mm/hr — ABNORMAL HIGH (ref 0–16)

## 2014-03-16 MED ORDER — DIPHENHYDRAMINE HCL 50 MG/ML IJ SOLN
25.0000 mg | Freq: Once | INTRAMUSCULAR | Status: AC
  Administered 2014-03-16: 25 mg via INTRAVENOUS
  Filled 2014-03-16: qty 1

## 2014-03-16 MED ORDER — ACETAMINOPHEN 325 MG PO TABS
650.0000 mg | ORAL_TABLET | Freq: Once | ORAL | Status: AC
  Administered 2014-03-16: 650 mg via ORAL
  Filled 2014-03-16: qty 2

## 2014-03-16 MED ORDER — FUROSEMIDE 10 MG/ML IJ SOLN
INTRAMUSCULAR | Status: AC
  Filled 2014-03-16: qty 4

## 2014-03-16 MED ORDER — FUROSEMIDE 10 MG/ML IJ SOLN: 20.0000 mg | Freq: Once | INTRAMUSCULAR | Status: DC

## 2014-03-16 MED ORDER — ZOLPIDEM TARTRATE 5 MG PO TABS
5.0000 mg | ORAL_TABLET | Freq: Every evening | ORAL | Status: DC | PRN
  Administered 2014-03-16 – 2014-03-19 (×5): 5 mg via ORAL
  Filled 2014-03-16 (×5): qty 1

## 2014-03-16 NOTE — Progress Notes (Signed)
Patient readmitted with partial wound dehiscence right bka One cm gap over 4 cm area of incision no active bleeding Off anticoagulants I'll inform Dr. Sharol Given of his admission This may heal by secondary intention or may require revision to [facility]

## 2014-03-16 NOTE — Progress Notes (Signed)
PATIENT DETAILS Name: [name] Age: 79 y.o. Sex: male Date of Birth: 10/22/1935 Admit Date: 03/15/2014 Admitting Physician Berle Mull, MD PTW:[id number] Percell Miller, MD  Subjective: No major complaints since admission, Right BKA dressing soaked with blood-but dry  Assessment/Plan: Principal Problem:   Wound dehiscence of Rt BKA stump -admitted with bleeding from his Rt BKA stump, orthopedics has been consulted -await ortho eval -c/w empiric Zosyn-day 2  Active Problems: Acute Blood loss anemia -secondary to blood loss from above -transfuse 2 units of PRBC on 4/26, repeat CBC in am  Hx of Afib -rate controlled with Cardizem, Digoxin -Eliquis held since last discharge-4/20-given ongoing bleeding will continue to hold  HTN -controlled with cardizem, lisinopril and Lasix  DM -CBG's controlled -c/w Lantus and SSI  Chronic Diastolic CHF -compensated -c/w lasix  GERD -stable, c/w PPI  S/P Permanent Pacemaker  Disposition: Remain inpatient  DVT Prophylaxis: SCD's  Code Status: Full code   Family Communication None at bedside  Procedures:  None  CONSULTS:  orthopedic surgery  Time spent 40 minutes-which includes 50% of the time with face-to-face with patient/ family and coordinating care related to the above assessment and plan.   MEDICATIONS: Scheduled Meds: . digoxin  0.125 mg Oral Daily  . diltiazem  180 mg Oral Daily  . doxycycline  100 mg Oral BID  . feeding supplement (GLUCERNA 1.2 CAL)  237 mL Oral BID  . ferrous fumarate  1 tablet Oral Q1200  . furosemide  20 mg Intravenous Once  . furosemide  40 mg Oral Daily  . insulin aspart  0-15 Units Subcutaneous TID WC  . insulin aspart  0-5 Units Subcutaneous QHS  . insulin glargine  20 Units Subcutaneous QHS  . lisinopril  10 mg Oral Daily  . oxyCODONE  10 mg Oral Q6H  . pantoprazole  40 mg Oral Daily  . piperacillin-tazobactam (ZOSYN)  IV  3.375 g Intravenous Q8H  .  senna-docusate  1 tablet Oral Daily  . sodium chloride  3 mL Intravenous Q12H   Continuous Infusions:  PRN Meds:.acetaminophen, acetaminophen, LORazepam, ondansetron (ZOFRAN) IV, ondansetron, zolpidem  Antibiotics: Anti-infectives   Start     Dose/Rate Route Frequency Ordered Stop   03/16/14 0600  piperacillin-tazobactam (ZOSYN) IVPB 3.375 g     3.375 g 12.5 mL/hr over 240 Minutes Intravenous Every 8 hours 03/15/14 2238     03/15/14 2245  doxycycline (VIBRA-TABS) tablet 100 mg     100 mg Oral 2 times daily 03/15/14 2233     03/15/14 2245  piperacillin-tazobactam (ZOSYN) IVPB 3.375 g     3.375 g 100 mL/hr over 30 Minutes Intravenous  Once 03/15/14 2238 03/16/14 0020       PHYSICAL EXAM: Vital signs in last 24 hours: Filed Vitals:   03/15/14 2130 03/15/14 2254 03/16/14 0500 03/16/14 0856  BP: 114/41 127/62 105/57 102/53  Pulse: 89 92 88 78  Temp:  98.2 F (36.8 C) 97.8 F (36.6 C) 98.4 F (36.9 C)  TempSrc:  Oral Oral Oral  Resp: 19 18 18 16   Height:  5' 1.2" (1.554 m)    Weight:  75 kg (165 lb 5.5 oz) 34.247 kg (75 lb 8 oz)   SpO2: 100% 95% 100% 100%    Weight change:  Filed Weights   03/15/14 2126 03/15/14 2254 03/16/14 0500  Weight: 71.668 kg (158 lb) 75 kg (165 lb 5.5 oz) 34.247 kg (75 lb 8 oz)   Body mass index is  14.18 kg/(m^2).   Gen Exam: Awake and alert with clear speech.   Neck: Supple, No JVD.   Chest: B/L Clear.   CVS: S1 S2 Regular, no murmurs.  Abdomen: soft, BS +, non tender, non distended.  Extremities: no edema, Left lower extremities warm to touch.Right lower ext-BKA-dressing soaked-but dry blood Neurologic: Non Focal.   Skin: No Rash.   Wounds: [name].    Intake/Output from previous day: No intake or output data in the 24 hours ending 03/16/14 0906   LAB RESULTS: CBC  Recent Labs Lab 03/09/14 0920 03/10/14 1035 03/11/14 0850 03/15/14 2019 03/16/14 0640  WBC 9.8  --  9.9 13.8* 9.3  HGB 8.3* 7.3* 9.3* 8.0* 6.4*  HCT 25.6* 21.9* 28.2*  25.0* 19.2*  PLT 193  --  182 340 238  MCV 81.8  --  83.7 86.2 86.5  MCH 26.5  --  27.6 27.6 28.8  MCHC 32.4  --  33.0 32.0 33.3  RDW 15.1  --  14.8 15.5 15.7*  LYMPHSABS  --   --   --  5.3* 1.9  MONOABS  --   --   --  0.9 0.7  EOSABS  --   --   --  0.4 0.2  BASOSABS  --   --   --  0.0 0.0    Chemistries   Recent Labs Lab 03/09/14 0920 03/15/14 2019 03/16/14 0640  NA 135* 133* 135*  K 4.4 4.5 5.0  CL 98 97 101  CO2 25 20 22   GLUCOSE 126* 258* 160*  BUN 29* 32* 37*  CREATININE 1.40* 1.28 1.37*  CALCIUM 8.8 8.6 8.2*    CBG:  Recent Labs Lab 03/10/14 2150 03/11/14 0757 03/11/14 1215 03/15/14 2326 03/16/14 0809  GLUCAP 239* 151* 189* 207* 162*    GFR Estimated Creatinine Clearance: 22.2 ml/min (by C-G formula based on Cr of 1.37).  Coagulation profile  Recent Labs Lab 03/15/14 2019  INR 1.29    Cardiac Enzymes No results found for this basename: CK, CKMB, TROPONINI, MYOGLOBIN,  in the last 168 hours  No components found with this basename: POCBNP,  No results found for this basename: DDIMER,  in the last 72 hours No results found for this basename: HGBA1C,  in the last 72 hours No results found for this basename: CHOL, HDL, LDLCALC, TRIG, CHOLHDL, LDLDIRECT,  in the last 72 hours No results found for this basename: TSH, T4TOTAL, FREET3, T3FREE, THYROIDAB,  in the last 72 hours No results found for this basename: VITAMINB12, FOLATE, FERRITIN, TIBC, IRON, RETICCTPCT,  in the last 72 hours No results found for this basename: LIPASE, AMYLASE,  in the last 72 hours  Urine Studies No results found for this basename: UACOL, UAPR, USPG, UPH, UTP, UGL, UKET, UBIL, UHGB, UNIT, UROB, ULEU, UEPI, UWBC, URBC, UBAC, CAST, CRYS, UCOM, BILUA,  in the last 72 hours  MICROBIOLOGY: Recent Results (from the past 240 hour(s))  MRSA PCR SCREENING     Status: None   Collection Time    03/15/14 11:36 PM      Result Value Ref Range Status   MRSA by PCR NEGATIVE  NEGATIVE  Final   Comment:            The GeneXpert MRSA Assay (FDA     approved for NASAL specimens     only), is one component of a     comprehensive MRSA colonization     surveillance program. It is not     intended  to diagnose MRSA     infection nor to guide or     monitor treatment for     MRSA infections.    RADIOLOGY STUDIES/RESULTS: No results found.  Jonetta Osgood, MD  [facility] Pager:[phone]  If 7PM-7AM, please contact night-coverage [email or web address] Password [email or web address] 03/16/2014, 9:06 AM   LOS: 1 day

## 2014-03-16 NOTE — Progress Notes (Signed)
CRITICAL VALUE ALERT  Critical value received:  Hgb 6.4  Date of notification: 03/16/2014  Time of notification:  0730  Critical value read back:yes  Nurse who received alert:    MD notified (1st page):  Ghimire MD  Time of first page:  0746  MD notified (2nd page):  Time of second page:  Responding MD:  Sloan Leiter MD  Time MD responded:  0800

## 2014-03-16 NOTE — Progress Notes (Signed)
Pt admitted to unit from ED. Pt A&O & VS stable. Pt has a stg 2 on sacrum & a R BKA w/ staples that has dehisced. Leg wrapped in gauze and foam placed on sacrum. Pt oriented to unit, call bell within reach, and currently resting comfortably in bed. Will continue to monitor.

## 2014-03-17 ENCOUNTER — Other Ambulatory Visit (HOSPITAL_COMMUNITY): Payer: Self-pay | Admitting: Orthopedic Surgery

## 2014-03-17 LAB — BASIC METABOLIC PANEL
BUN: 37 mg/dL — ABNORMAL HIGH (ref 6–23)
CO2: 23 mEq/L (ref 19–32)
CREATININE: 1.57 mg/dL — AB (ref 0.50–1.35)
Calcium: 8.4 mg/dL (ref 8.4–10.5)
Chloride: 100 mEq/L (ref 96–112)
GFR calc Af Amer: 48 mL/min — ABNORMAL LOW (ref >90)
GFR calc non Af Amer: 41 mL/min — ABNORMAL LOW (ref >90)
GLUCOSE: 145 mg/dL — AB (ref 70–99)
Potassium: 4.7 mEq/L (ref 3.7–5.3)
Sodium: 135 mEq/L — ABNORMAL LOW (ref 137–147)

## 2014-03-17 LAB — TYPE AND SCREEN
ABO/RH(D): B POS
Antibody Screen: POSITIVE
DAT, IgG: NEGATIVE
UNIT DIVISION: 0
Unit division: 0

## 2014-03-17 LAB — GLUCOSE, CAPILLARY
Glucose-Capillary: 132 mg/dL — ABNORMAL HIGH (ref 70–99)
Glucose-Capillary: 151 mg/dL — ABNORMAL HIGH (ref 70–99)
Glucose-Capillary: 184 mg/dL — ABNORMAL HIGH (ref 70–99)
Glucose-Capillary: 154 mg/dL — ABNORMAL HIGH (ref 70–99)

## 2014-03-17 LAB — CBC
HCT: 24.2 % — ABNORMAL LOW (ref 39.0–52.0)
Hemoglobin: 8 g/dL — ABNORMAL LOW (ref 13.0–17.0)
MCH: 28.6 pg (ref 26.0–34.0)
MCHC: 33.1 g/dL (ref 30.0–36.0)
MCV: 86.4 fL (ref 78.0–100.0)
PLATELETS: 194 10*3/uL (ref 150–400)
RBC: 2.8 MIL/uL — AB (ref 4.22–5.81)
RDW: 15.1 % (ref 11.5–15.5)
WBC: 7.3 10*3/uL (ref 4.0–10.5)

## 2014-03-17 LAB — SURGICAL PCR SCREEN
MRSA, PCR: NEGATIVE
STAPHYLOCOCCUS AUREUS: POSITIVE — AB

## 2014-03-17 MED ORDER — MUPIROCIN 2 % EX OINT
1.0000 "application " | TOPICAL_OINTMENT | Freq: Two times a day (BID) | CUTANEOUS | Status: DC
  Administered 2014-03-17 – 2014-03-20 (×6): 1 via NASAL
  Filled 2014-03-17: qty 22

## 2014-03-17 MED ORDER — CEFAZOLIN SODIUM-DEXTROSE 2-3 GM-% IV SOLR
2.0000 g | INTRAVENOUS | Status: AC
  Administered 2014-03-18: 2 g via INTRAVENOUS
  Filled 2014-03-17 (×2): qty 50

## 2014-03-17 MED ORDER — CHLORHEXIDINE GLUCONATE CLOTH 2 % EX PADS
6.0000 | MEDICATED_PAD | Freq: Every day | CUTANEOUS | Status: DC
  Administered 2014-03-17 – 2014-03-19 (×2): 6 via TOPICAL

## 2014-03-17 MED ORDER — GLUCERNA SHAKE PO LIQD
237.0000 mL | Freq: Two times a day (BID) | ORAL | Status: DC
  Administered 2014-03-17 – 2014-03-20 (×4): 237 mL via ORAL
  Filled 2014-03-17: qty 237

## 2014-03-17 NOTE — Progress Notes (Signed)
INITIAL NUTRITION ASSESSMENT  DOCUMENTATION CODES Per approved criteria  -Not Applicable   INTERVENTION: Change Glucerna 1.2 to Glucerna Shake po BID. Encouraged continued adequate intake of meals. RD to continue to follow nutrition care plan.  NUTRITION DIAGNOSIS: Increased nutrient needs related to post-op healing as evidenced by estimated needs.   Goal: Intake to meet >90% of estimated nutrition needs.  Monitor:  Intake to meet >90% of estimated nutrition needs.  Reason for Assessment: Malnutrition Screening Tool  79 y.o. male  Admitting Dx: Wound dehiscence  ASSESSMENT: PMHx significant for PVD, DM2. Underwent recent R BKA revision. Admitted with bleeding from revision site. Recently admitted with same dx. Work-up reveals wound dehiscence.  Per chart, plan is for repeat I&D and wound closure of BKA site tomorrow.  Pt reports that overall, his intake has improved since his initial BKA. He eats on average 75% of his meals. States that his appetite is doing well. Enjoys the food at the SNF and enjoys the hospital food too. Drinks Glucerna Shakes as able to help meet his calorie needs. His weight appears to have stabilized, however he has severe fat and muscle mass depletion from his chronic weight loss.  Nutrition Focused Physical Exam:  Subcutaneous Fat:  Orbital Region: WNL Upper Arm Region: severe depletion Thoracic and Lumbar Region: n/a  Muscle:  Temple Region: moderate depletion Clavicle Bone Region: severe depletion Clavicle and Acromion Bone Region: n/a Scapular Bone Region: n/a Dorsal Hand: severe depletion Patellar Region: n/a Anterior Thigh Region: n/a Posterior Calf Region: n/a  Edema: none  Pt meets criteria for severe MALNUTRITION in the context of chronic illness as evidenced by severe fat and muscle mass loss.    Height: Ht Readings from Last 1 Encounters:  03/15/14 5' 1.2" (1.554 m)  Previous height - 5'10"  Weight: Wt Readings from Last  1 Encounters:  03/17/14 167 lb (75.751 kg)    Ideal Body Weight: 155 lb (adjusted for BKA)  % Ideal Body Weight: 107%  Wt Readings from Last 25 Encounters:  03/17/14 167 lb (75.751 kg)  03/14/14 158 lb (71.668 kg)  03/09/14 163 lb 5.8 oz (74.1 kg)  03/07/14 161 lb (73.029 kg)  03/07/14 161 lb (73.029 kg)  03/03/14 161 lb 13.1 oz (73.4 kg)  02/18/14 162 lb (73.483 kg)  02/18/14 160 lb (72.576 kg)  02/14/14 162 lb (73.483 kg)  02/14/14 162 lb (73.483 kg)  02/03/14 161 lb (73.029 kg)  01/28/14 161 lb (73.029 kg)  01/24/14 175 lb 14.8 oz (79.8 kg)  01/24/14 175 lb 14.8 oz (79.8 kg)  01/24/14 175 lb 14.8 oz (79.8 kg)  01/09/14 169 lb (76.658 kg)  12/06/13 176 lb (79.833 kg)  11/07/13 170 lb (77.111 kg)  10/30/13 180 lb (81.647 kg)  09/30/13 187 lb (84.823 kg)  09/02/13 185 lb (83.915 kg)  08/26/13 182 lb (82.555 kg)  06/17/13 177 lb (80.287 kg)  04/30/13 182 lb (82.555 kg)  04/09/13 187 lb (84.823 kg)   Usual Body Weight: 180 - 185 lb --> 1 year ago  % Usual Body Weight: 91% (this also includes 6.5% wt loss from BKA)  BMI:  25.6 (adjusted for BKA) - overweight  Estimated Nutritional Needs: Kcal: 1800 - 2000 Protein: 75 - 85 g Fluid: 1.8 - 2 liters  Skin:  Stage II L buttocks Closed R BKA incision  Diet Order:   Heart Healthy/CHO Modified  EDUCATION NEEDS: -No education needs identified at this time   Intake/Output Summary (Last 24 hours) at 03/17/14 1128  Last data filed at 03/17/14 1049  Gross per 24 hour  Intake 1996.5 ml  Output   1095 ml  Net  901.5 ml    Last BM: 4/26  Labs:   Recent Labs Lab 03/15/14 2019 03/16/14 0640 03/17/14 0647  NA 133* 135* 135*  K 4.5 5.0 4.7  CL 97 101 100  CO2 20 22 23   BUN 32* 37* 37*  CREATININE 1.28 1.37* 1.57*  CALCIUM 8.6 8.2* 8.4  GLUCOSE 258* 160* 145*    CBG (last 3)   Recent Labs  03/16/14 1646 03/16/14 2106 03/17/14 0749  GLUCAP 162* 179* 132*    Scheduled Meds: . digoxin  0.125 mg Oral  Daily  . diltiazem  180 mg Oral Daily  . feeding supplement (GLUCERNA 1.2 CAL)  237 mL Oral BID  . ferrous fumarate  1 tablet Oral Q1200  . furosemide  20 mg Intravenous Once  . furosemide  40 mg Oral Daily  . insulin aspart  0-15 Units Subcutaneous TID WC  . insulin aspart  0-5 Units Subcutaneous QHS  . insulin glargine  20 Units Subcutaneous QHS  . lisinopril  10 mg Oral Daily  . oxyCODONE  10 mg Oral Q6H  . pantoprazole  40 mg Oral Daily  . piperacillin-tazobactam (ZOSYN)  IV  3.375 g Intravenous Q8H  . senna-docusate  1 tablet Oral Daily  . sodium chloride  3 mL Intravenous Q12H    Continuous Infusions:   Past Medical History  Diagnosis Date  . Fatigue   . Overweight   . Syncope and collapse   . GERD (gastroesophageal reflux disease)   . Right bundle branch block   . Unspecified venous (peripheral) insufficiency   . Phlebitis and thrombophlebitis of superficial vessels of lower extremities   . Cellulitis and abscess of leg, except foot   . Backache, unspecified   . Long term (current) use of anticoagulants   . Type II or unspecified type diabetes mellitus without mention of complication, not stated as uncontrolled   . Unspecified venous (peripheral) insufficiency   . Hypertension   . Gout, unspecified   . Heart failure   . Atrial fibrillation     permanent  . Esophageal stricture   . Hx of colonic polyps 05-2006    (Adenomatous)Dr. [name]  . Diverticulosis 05-2006    Dr. [name]   . Tachycardia-bradycardia     s/p PPM  . Pacemaker- [name] 02/20/2013  . CHF (congestive heart failure)     Past Surgical History  Procedure Laterality Date  . Laminectomy    . Lumbar fusion    . Rotator cuff repair    . Ruptured rt rectus muscle    . Eye surgery    . Pacemaker insertion  8/12    SJM by [name] for tachy/brady syndrome  . I&d extremity Right 01/17/2014    Procedure: IRRIGATION AND DEBRIDEMENT RIGHT HEEL  WITH CULTURES AND BONE BIOPSY, placement of wound vac;   Surgeon: Theodoro Kos, DO;  Location: [place];  Service: Plastics;  Laterality: Right;  . Amputation Right 01/20/2014    Procedure: AMPUTATION BELOW KNEE- right;  Surgeon: [name], MD;  Location: [place];  Service: Orthopedics;  Laterality: Right;  Right Below Knee Amputation  . Amputation Right 02/14/2014    Procedure: AMPUTATION BELOW KNEE;  Surgeon: [name], MD;  Location: [place];  Service: Orthopedics;  Laterality: Right;  Right Below Knee Amputation Revision  . Amputation Right 03/07/2014  Procedure: AMPUTATION BELOW KNEE;  Surgeon: [name], MD;  Location: [place];  Service: Orthopedics;  Laterality: Right;  Right Below Knee Amputation Revision    Inda Coke MS, RD, LDN Inpatient Registered Dietitian Pager: [phone] After-hours pager: [phone]

## 2014-03-17 NOTE — Consult Note (Signed)
  Patient presents status post revision right transtibial amputation with dehiscence. Patient feels this is secondary to the aggressive physical therapy. Most likely do to microcirculation and residual effects of his blood thinner. Will plan for repeat irrigation and debridement and wound closure on Tuesday.

## 2014-03-17 NOTE — Progress Notes (Signed)
PATIENT DETAILS Name: [name] Age: 79 y.o. Sex: male Date of Birth: [birth year] Admit Date: 03/15/2014 Admitting Physician Berle Mull, MD ERX:[id number] EDWARD, MD  Subjective: No major complaints overnight  Assessment/Plan: Principal Problem:   Wound dehiscence of Rt BKA stump -admitted with bleeding from his Rt BKA stump, orthopedics has been consulted -for debridement and revision of Right BKA on 4/28 -c/w empiric Zosyn-day 3  Active Problems: Acute Blood loss anemia -secondary to blood loss from above -transfuse 2 units of PRBC on 4/26, repeat CBC this am 8.0 Hx of Afib -rate controlled with Cardizem, Digoxin -Eliquis held since last discharge-4/20-given ongoing bleeding will continue to hold  HTN -controlled with cardizem, lisinopril and Lasix  DM -CBG's controlled -c/w Lantus and SSI  Chronic Diastolic CHF -compensated -c/w lasix  GERD -stable, c/w PPI  S/P Permanent Pacemaker  Disposition: Remain inpatient  DVT Prophylaxis: SCD's  Code Status: Full code   Family Communication None at bedside  Procedures:  None  CONSULTS:  orthopedic surgery  MEDICATIONS: Scheduled Meds: . digoxin  0.125 mg Oral Daily  . diltiazem  180 mg Oral Daily  . feeding supplement (GLUCERNA 1.2 CAL)  237 mL Oral BID  . ferrous fumarate  1 tablet Oral Q1200  . furosemide  20 mg Intravenous Once  . furosemide  40 mg Oral Daily  . insulin aspart  0-15 Units Subcutaneous TID WC  . insulin aspart  0-5 Units Subcutaneous QHS  . insulin glargine  20 Units Subcutaneous QHS  . lisinopril  10 mg Oral Daily  . oxyCODONE  10 mg Oral Q6H  . pantoprazole  40 mg Oral Daily  . piperacillin-tazobactam (ZOSYN)  IV  3.375 g Intravenous Q8H  . senna-docusate  1 tablet Oral Daily  . sodium chloride  3 mL Intravenous Q12H   Continuous Infusions:  PRN Meds:.acetaminophen, acetaminophen, LORazepam, ondansetron (ZOFRAN) IV, ondansetron,  zolpidem  Antibiotics: Anti-infectives   Start     Dose/Rate Route Frequency Ordered Stop   03/16/14 0600  piperacillin-tazobactam (ZOSYN) IVPB 3.375 g     3.375 g 12.5 mL/hr over 240 Minutes Intravenous Every 8 hours 03/15/14 2238     03/15/14 2245  doxycycline (VIBRA-TABS) tablet 100 mg  Status:  Discontinued     100 mg Oral 2 times daily 03/15/14 2233 03/16/14 0913   03/15/14 2245  piperacillin-tazobactam (ZOSYN) IVPB 3.375 g     3.375 g 100 mL/hr over 30 Minutes Intravenous  Once 03/15/14 2238 03/16/14 0020       PHYSICAL EXAM: Vital signs in last 24 hours: Filed Vitals:   03/16/14 1525 03/16/14 1649 03/16/14 2107 03/17/14 0533  BP: 119/43 99/41 115/52 133/63  Pulse: 63 57 61 68  Temp: 98.9 F (37.2 C) 99 F (37.2 C) 99.2 F (37.3 C) 98.5 F (36.9 C)  TempSrc: Oral Oral Oral Oral  Resp: 16 14 16 16   Height:      Weight:    75.751 kg (167 lb)  SpO2: 100% 100% 98% 99%    Weight change: 4.082 kg (9 lb) Filed Weights   03/15/14 2254 03/16/14 0500 03/17/14 0533  Weight: 75 kg (165 lb 5.5 oz) 75.705 kg (166 lb 14.4 oz) 75.751 kg (167 lb)   Body mass index is 31.37 kg/(m^2).   Gen Exam: Awake and alert with clear speech.   Neck: Supple, No JVD.   Chest: B/L Clear.   CVS: S1 S2 Regular, no murmurs.  Abdomen: soft, BS +,  non tender, non distended.  Extremities: no edema, Left lower extremities warm to touch.Right lower ext-BKA-dressing clean today Neurologic: Non Focal.   Skin: No Rash.   Wounds: [name].    Intake/Output from previous day:  Intake/Output Summary (Last 24 hours) at 03/17/14 1019 Last data filed at 03/17/14 0900  Gross per 24 hour  Intake 1996.5 ml  Output    945 ml  Net 1051.5 ml     LAB RESULTS: CBC  Recent Labs Lab 03/11/14 0850 03/15/14 2019 03/16/14 0640 03/16/14 1835 03/17/14 0647  WBC 9.9 13.8* 9.3 8.4 7.3  HGB 9.3* 8.0* 6.4* 8.4* 8.0*  HCT 28.2* 25.0* 19.2* 24.5* 24.2*  PLT 182 340 238 207 194  MCV 83.7 86.2 86.5 84.8 86.4   MCH 27.6 27.6 28.8 29.1 28.6  MCHC 33.0 32.0 33.3 34.3 33.1  RDW 14.8 15.5 15.7* 15.0 15.1  LYMPHSABS  --  5.3* 1.9  --   --   MONOABS  --  0.9 0.7  --   --   EOSABS  --  0.4 0.2  --   --   BASOSABS  --  0.0 0.0  --   --     Chemistries   Recent Labs Lab 03/15/14 2019 03/16/14 0640 03/17/14 0647  NA 133* 135* 135*  K 4.5 5.0 4.7  CL 97 101 100  CO2 20 22 23   GLUCOSE 258* 160* 145*  BUN 32* 37* 37*  CREATININE 1.28 1.37* 1.57*  CALCIUM 8.6 8.2* 8.4    CBG:  Recent Labs Lab 03/16/14 0809 03/16/14 1240 03/16/14 1646 03/16/14 2106 03/17/14 0749  GLUCAP 162* 160* 162* 179* 132*    GFR Estimated Creatinine Clearance: 35.1 ml/min (by C-G formula based on Cr of 1.57).  Coagulation profile  Recent Labs Lab 03/15/14 2019  INR 1.29    Cardiac Enzymes No results found for this basename: CK, CKMB, TROPONINI, MYOGLOBIN,  in the last 168 hours  No components found with this basename: POCBNP,  No results found for this basename: DDIMER,  in the last 72 hours No results found for this basename: HGBA1C,  in the last 72 hours No results found for this basename: CHOL, HDL, LDLCALC, TRIG, CHOLHDL, LDLDIRECT,  in the last 72 hours No results found for this basename: TSH, T4TOTAL, FREET3, T3FREE, THYROIDAB,  in the last 72 hours No results found for this basename: VITAMINB12, FOLATE, FERRITIN, TIBC, IRON, RETICCTPCT,  in the last 72 hours No results found for this basename: LIPASE, AMYLASE,  in the last 72 hours  Urine Studies No results found for this basename: UACOL, UAPR, USPG, UPH, UTP, UGL, UKET, UBIL, UHGB, UNIT, UROB, ULEU, UEPI, UWBC, URBC, UBAC, CAST, CRYS, UCOM, BILUA,  in the last 72 hours  MICROBIOLOGY: Recent Results (from the past 240 hour(s))  CULTURE, BLOOD (ROUTINE X 2)     Status: None   Collection Time    03/15/14 11:35 PM      Result Value Ref Range Status   Specimen Description BLOOD LEFT ARM   Final   Special Requests BOTTLES DRAWN AEROBIC ONLY  6CC   Final   Culture  Setup Time     Final   Value: 03/16/2014 06:17     Performed at [facility]   Culture     Final   Value:        BLOOD CULTURE RECEIVED NO GROWTH TO DATE CULTURE WILL BE HELD FOR 5 DAYS BEFORE ISSUING A FINAL NEGATIVE REPORT  Performed at [facility]   Report Status PENDING   Incomplete  MRSA PCR SCREENING     Status: None   Collection Time    03/15/14 11:36 PM      Result Value Ref Range Status   MRSA by PCR NEGATIVE  NEGATIVE Final   Comment:            The GeneXpert MRSA Assay (FDA     approved for NASAL specimens     only), is one component of a     comprehensive MRSA colonization     surveillance program. It is not     intended to diagnose MRSA     infection nor to guide or     monitor treatment for     MRSA infections.  CULTURE, BLOOD (ROUTINE X 2)     Status: None   Collection Time    03/15/14 11:41 PM      Result Value Ref Range Status   Specimen Description BLOOD RIGHT ARM   Final   Special Requests BOTTLES DRAWN AEROBIC ONLY 5CC   Final   Culture  Setup Time     Final   Value: 03/16/2014 06:17     Performed at [facility]   Culture     Final   Value:        BLOOD CULTURE RECEIVED NO GROWTH TO DATE CULTURE WILL BE HELD FOR 5 DAYS BEFORE ISSUING A FINAL NEGATIVE REPORT     Performed at [facility]   Report Status PENDING   Incomplete    RADIOLOGY STUDIES/RESULTS: No results found.  Jonetta Osgood, MD  [facility] Pager:[phone]  If 7PM-7AM, please contact night-coverage [email or web address] Password [email or web address] 03/17/2014, 10:19 AM   LOS: 2 days

## 2014-03-17 NOTE — Progress Notes (Signed)
Dr. Sharol Given notified of pt request to speak with him concerning surgery

## 2014-03-17 NOTE — Progress Notes (Signed)
Clinical Social Work Department BRIEF PSYCHOSOCIAL ASSESSMENT 03/17/2014  Patient:  YUNIS, VOORHEIS     Account Number:  [id number]     Admit date:  03/15/2014  Clinical Social Worker:  Megan Salon  Date/Time:  03/17/2014 04:00 PM  Referred by:  Care Management  Date Referred:  03/17/2014 Referred for  SNF Placement   Other Referral:   Interview type:  Patient Other interview type:    PSYCHOSOCIAL DATA Living Status:  FACILITY Admitted from facility:  [place], [place] Level of care:  [place] Primary support name:  Frantz Quattrone Primary support relationship to patient:  SPOUSE Degree of support available:   Good    CURRENT CONCERNS Current Concerns  Post-Acute Placement   Other Concerns:    SOCIAL WORK ASSESSMENT / PLAN Per Case Manager patient is from [facility]. CSW met with patient and introduced self and explained reason for visit. Patient was eating lunch during visit and asked social worker to sit down. Patient states he originally lived at home with his wife, but went to rehab at [facility]. Patient states he wants to go back to [facility] and states he really likes it there. Patient stated they really care about him and have good therapy. Patient states he has surgery tomorrow that he is nervous for, but states he does not want to leave the hospital too early this time and wants to make sure he has a good recovery. CSW will continue to follow and check on patient after surgery.   Assessment/plan status:  Psychosocial Support/Ongoing Assessment of Needs Other assessment/ plan:   Information/referral to community resources:   CSW contact information    PATIENT'S/FAMILY'S RESPONSE TO PLAN OF CARE: Patient would like to go back to [facility].        Jeanette Caprice, MSW, [place]

## 2014-03-17 NOTE — ED Provider Notes (Signed)
I saw and evaluated the patient, reviewed the resident's note and I agree with the findings and plan.   EKG Interpretation None      Pt with recent BKA complicated by wound dehiscence. Has had revision during previous admission for bleeding and need for transfusion. Began bleeding again today. Blood soaked dressing removed, no obvious source of bleeding, but some oozing continues and wound had dehisced again.   Charles B. Karle Starch, MD 03/17/14 [phone]

## 2014-03-18 ENCOUNTER — Inpatient Hospital Stay (HOSPITAL_COMMUNITY): Payer: Medicare Other | Admitting: Certified Registered"

## 2014-03-18 ENCOUNTER — Encounter (HOSPITAL_COMMUNITY): Payer: Self-pay | Admitting: Certified Registered Nurse Anesthetist

## 2014-03-18 ENCOUNTER — Encounter (HOSPITAL_COMMUNITY)
Admission: EM | Disposition: A | Discharge status: Skilled Nursing Facility | Payer: Self-pay | Source: Home / Self Care | Attending: Internal Medicine

## 2014-03-18 ENCOUNTER — Encounter (HOSPITAL_COMMUNITY): Payer: Medicare Other | Admitting: Certified Registered"

## 2014-03-18 HISTORY — PX: AMPUTATION: SHX166

## 2014-03-18 LAB — BASIC METABOLIC PANEL
BUN: 30 mg/dL — ABNORMAL HIGH (ref 6–23)
CALCIUM: 8.8 mg/dL (ref 8.4–10.5)
CO2: 25 meq/L (ref 19–32)
CREATININE: 1.53 mg/dL — AB (ref 0.50–1.35)
Chloride: 99 mEq/L (ref 96–112)
GFR, EST AFRICAN AMERICAN: 49 mL/min — AB (ref >90)
GFR, EST NON AFRICAN AMERICAN: 42 mL/min — AB (ref >90)
Glucose, Bld: 113 mg/dL — ABNORMAL HIGH (ref 70–99)
Potassium: 4.3 mEq/L (ref 3.7–5.3)
SODIUM: 136 meq/L — AB (ref 137–147)

## 2014-03-18 LAB — CBC
HCT: 25 % — ABNORMAL LOW (ref 39.0–52.0)
Hemoglobin: 8.3 g/dL — ABNORMAL LOW (ref 13.0–17.0)
MCH: 28.7 pg (ref 26.0–34.0)
MCHC: 33.2 g/dL (ref 30.0–36.0)
MCV: 86.5 fL (ref 78.0–100.0)
PLATELETS: 222 10*3/uL (ref 150–400)
RBC: 2.89 MIL/uL — ABNORMAL LOW (ref 4.22–5.81)
RDW: 14.9 % (ref 11.5–15.5)
WBC: 6.6 10*3/uL (ref 4.0–10.5)

## 2014-03-18 LAB — GLUCOSE, CAPILLARY
GLUCOSE-CAPILLARY: 104 mg/dL — AB (ref 70–99)
Glucose-Capillary: 114 mg/dL — ABNORMAL HIGH (ref 70–99)
Glucose-Capillary: 107 mg/dL — ABNORMAL HIGH (ref 70–99)
Glucose-Capillary: 105 mg/dL — ABNORMAL HIGH (ref 70–99)

## 2014-03-18 SURGERY — AMPUTATION BELOW KNEE
Anesthesia: General | Site: Leg Upper | Laterality: Right

## 2014-03-18 MED ORDER — ONDANSETRON HCL 4 MG/2ML IJ SOLN: 4.0000 mg | Freq: Four times a day (QID) | INTRAMUSCULAR | Status: DC | PRN

## 2014-03-18 MED ORDER — OXYCODONE HCL 5 MG PO TABS
5.0000 mg | ORAL_TABLET | Freq: Once | ORAL | Status: AC | PRN
  Administered 2014-03-18: 5 mg via ORAL

## 2014-03-18 MED ORDER — HYDROMORPHONE HCL PF 1 MG/ML IJ SOLN
INTRAMUSCULAR | Status: AC
  Administered 2014-03-18: 0.5 mg via INTRAVENOUS
  Filled 2014-03-18: qty 1

## 2014-03-18 MED ORDER — HYDROMORPHONE HCL PF 1 MG/ML IJ SOLN
INTRAMUSCULAR | Status: AC
  Filled 2014-03-18: qty 1

## 2014-03-18 MED ORDER — LIDOCAINE HCL (CARDIAC) 20 MG/ML IV SOLN
INTRAVENOUS | Status: AC
  Filled 2014-03-18: qty 5

## 2014-03-18 MED ORDER — FENTANYL CITRATE 0.05 MG/ML IJ SOLN
INTRAMUSCULAR | Status: AC
  Filled 2014-03-18: qty 5

## 2014-03-18 MED ORDER — PROPOFOL 10 MG/ML IV BOLUS
INTRAVENOUS | Status: AC
  Filled 2014-03-18: qty 20

## 2014-03-18 MED ORDER — OXYCODONE HCL 5 MG PO TABS
ORAL_TABLET | ORAL | Status: AC
  Administered 2014-03-18: 5 mg via ORAL
  Filled 2014-03-18: qty 1

## 2014-03-18 MED ORDER — DIAZEPAM 5 MG/ML IJ SOLN
INTRAMUSCULAR | Status: AC
  Administered 2014-03-18: 1 mg via INTRAVENOUS
  Filled 2014-03-18: qty 2

## 2014-03-18 MED ORDER — ONDANSETRON HCL 4 MG/2ML IJ SOLN
INTRAMUSCULAR | Status: DC | PRN
  Administered 2014-03-18: 4 mg via INTRAVENOUS

## 2014-03-18 MED ORDER — PROMETHAZINE HCL 25 MG/ML IJ SOLN: 6.2500 mg | INTRAMUSCULAR | Status: DC | PRN

## 2014-03-18 MED ORDER — ARTIFICIAL TEARS OP OINT
TOPICAL_OINTMENT | OPHTHALMIC | Status: DC | PRN
  Administered 2014-03-18: 1 via OPHTHALMIC

## 2014-03-18 MED ORDER — 0.9 % SODIUM CHLORIDE (POUR BTL) OPTIME
TOPICAL | Status: DC | PRN
  Administered 2014-03-18: 500 mL

## 2014-03-18 MED ORDER — ONDANSETRON HCL 4 MG PO TABS: 4.0000 mg | ORAL_TABLET | Freq: Four times a day (QID) | ORAL | Status: DC | PRN

## 2014-03-18 MED ORDER — METOCLOPRAMIDE HCL 5 MG/ML IJ SOLN
5.0000 mg | Freq: Three times a day (TID) | INTRAMUSCULAR | Status: DC | PRN
  Filled 2014-03-18: qty 2

## 2014-03-18 MED ORDER — ONDANSETRON HCL 4 MG/2ML IJ SOLN
INTRAMUSCULAR | Status: AC
  Filled 2014-03-18: qty 2

## 2014-03-18 MED ORDER — DIAZEPAM 5 MG/ML IJ SOLN
1.0000 mg | INTRAMUSCULAR | Status: AC | PRN
  Administered 2014-03-18 (×3): 1 mg via INTRAVENOUS

## 2014-03-18 MED ORDER — HYDROMORPHONE HCL PF 1 MG/ML IJ SOLN
0.5000 mg | INTRAMUSCULAR | Status: DC | PRN
  Administered 2014-03-19 – 2014-03-20 (×2): 1 mg via INTRAVENOUS
  Filled 2014-03-18 (×2): qty 1

## 2014-03-18 MED ORDER — HYDROMORPHONE HCL PF 1 MG/ML IJ SOLN
0.5000 mg | INTRAMUSCULAR | Status: AC | PRN
  Administered 2014-03-18 (×4): 0.5 mg via INTRAVENOUS

## 2014-03-18 MED ORDER — PROPOFOL 10 MG/ML IV BOLUS
INTRAVENOUS | Status: DC | PRN
  Administered 2014-03-18: 120 mg via INTRAVENOUS

## 2014-03-18 MED ORDER — LIDOCAINE HCL 1 % IJ SOLN
INTRAMUSCULAR | Status: DC | PRN
  Administered 2014-03-18: 80 mg via INTRADERMAL

## 2014-03-18 MED ORDER — HYDROMORPHONE HCL PF 1 MG/ML IJ SOLN
0.2500 mg | INTRAMUSCULAR | Status: DC | PRN
  Administered 2014-03-18 (×4): 0.5 mg via INTRAVENOUS

## 2014-03-18 MED ORDER — FENTANYL CITRATE 0.05 MG/ML IJ SOLN
INTRAMUSCULAR | Status: DC | PRN
  Administered 2014-03-18 (×4): 25 ug via INTRAVENOUS

## 2014-03-18 MED ORDER — MEPERIDINE HCL 25 MG/ML IJ SOLN
6.2500 mg | INTRAMUSCULAR | Status: DC | PRN
Start: 2014-03-18 — End: 2014-03-18

## 2014-03-18 MED ORDER — METOCLOPRAMIDE HCL 10 MG PO TABS: 5.0000 mg | ORAL_TABLET | Freq: Three times a day (TID) | ORAL | Status: DC | PRN

## 2014-03-18 MED ORDER — ARTIFICIAL TEARS OP OINT
TOPICAL_OINTMENT | OPHTHALMIC | Status: AC
  Filled 2014-03-18: qty 3.5

## 2014-03-18 MED ORDER — OXYCODONE HCL 5 MG/5ML PO SOLN: 5.0000 mg | Freq: Once | ORAL | Status: AC | PRN

## 2014-03-18 MED ORDER — LACTATED RINGERS IV SOLN
INTRAVENOUS | Status: DC | PRN
  Administered 2014-03-18 (×2): via INTRAVENOUS

## 2014-03-18 SURGICAL SUPPLY — 46 items
BANDAGE ESMARK 6X9 LF (GAUZE/BANDAGES/DRESSINGS) ×1 IMPLANT
BANDAGE GAUZE ELAST BULKY 4 IN (GAUZE/BANDAGES/DRESSINGS) ×3 IMPLANT
BLADE 10 SAFETY STRL DISP (BLADE) ×3 IMPLANT
BLADE SAW RECIP 87.9 MT (BLADE) ×3 IMPLANT
BLADE SURG 21 STRL SS (BLADE) ×3 IMPLANT
BNDG COHESIVE 6X5 TAN STRL LF (GAUZE/BANDAGES/DRESSINGS) ×3 IMPLANT
BNDG ESMARK 6X9 LF (GAUZE/BANDAGES/DRESSINGS) ×3
COVER SURGICAL LIGHT HANDLE (MISCELLANEOUS) ×3 IMPLANT
CUFF TOURNIQUET SINGLE 34IN LL (TOURNIQUET CUFF) IMPLANT
CUFF TOURNIQUET SINGLE 44IN (TOURNIQUET CUFF) IMPLANT
DRAIN PENROSE 1/2X12 LTX STRL (WOUND CARE) IMPLANT
DRAPE EXTREMITY T 121X128X90 (DRAPE) ×3 IMPLANT
DRAPE PROXIMA HALF (DRAPES) ×6 IMPLANT
DRAPE U-SHAPE 47X51 STRL (DRAPES) ×6 IMPLANT
DRSG ADAPTIC 3X8 NADH LF (GAUZE/BANDAGES/DRESSINGS) ×3 IMPLANT
DRSG PAD ABDOMINAL 8X10 ST (GAUZE/BANDAGES/DRESSINGS) ×3 IMPLANT
DURAPREP 26ML APPLICATOR (WOUND CARE) ×3 IMPLANT
ELECT REM PT RETURN 9FT ADLT (ELECTROSURGICAL) ×3
ELECTRODE REM PT RTRN 9FT ADLT (ELECTROSURGICAL) ×1 IMPLANT
GLOVE BIO SURGEON STRL SZ7 (GLOVE) ×3 IMPLANT
GLOVE BIOGEL PI IND STRL 9 (GLOVE) ×1 IMPLANT
GLOVE BIOGEL PI INDICATOR 9 (GLOVE) ×2
GLOVE SURG ORTHO 9.0 STRL STRW (GLOVE) ×3 IMPLANT
GOWN STRL REUS W/ TWL XL LVL3 (GOWN DISPOSABLE) ×2 IMPLANT
GOWN STRL REUS W/TWL XL LVL3 (GOWN DISPOSABLE) ×4
KIT BASIN OR (CUSTOM PROCEDURE TRAY) ×3 IMPLANT
KIT ROOM TURNOVER OR (KITS) ×3 IMPLANT
MANIFOLD NEPTUNE II (INSTRUMENTS) ×3 IMPLANT
NS IRRIG 1000ML POUR BTL (IV SOLUTION) ×3 IMPLANT
PACK GENERAL/GYN (CUSTOM PROCEDURE TRAY) ×3 IMPLANT
PAD ABD 8X10 STRL (GAUZE/BANDAGES/DRESSINGS) ×3 IMPLANT
PAD ARMBOARD 7.5X6 YLW CONV (MISCELLANEOUS) ×6 IMPLANT
SPONGE GAUZE 4X4 12PLY (GAUZE/BANDAGES/DRESSINGS) ×3 IMPLANT
SPONGE GAUZE 4X4 12PLY STER LF (GAUZE/BANDAGES/DRESSINGS) ×3 IMPLANT
SPONGE LAP 18X18 X RAY DECT (DISPOSABLE) IMPLANT
STAPLER VISISTAT 35W (STAPLE) IMPLANT
STOCKINETTE IMPERVIOUS LG (DRAPES) ×3 IMPLANT
SUT ETHILON 2 0 PSLX (SUTURE) ×3 IMPLANT
SUT PDS AB 1 CT  36 (SUTURE)
SUT PDS AB 1 CT 36 (SUTURE) IMPLANT
SUT SILK 2 0 (SUTURE) ×2
SUT SILK 2-0 18XBRD TIE 12 (SUTURE) ×1 IMPLANT
TOWEL OR 17X24 6PK STRL BLUE (TOWEL DISPOSABLE) ×3 IMPLANT
TOWEL OR 17X26 10 PK STRL BLUE (TOWEL DISPOSABLE) ×3 IMPLANT
TUBE ANAEROBIC SPECIMEN COL (MISCELLANEOUS) IMPLANT
WATER STERILE IRR 1000ML POUR (IV SOLUTION) ×3 IMPLANT

## 2014-03-18 NOTE — Op Note (Signed)
OPERATIVE REPORT  DATE OF SURGERY: 03/18/2014  PATIENT:  [name],  79 y.o. male  PRE-OPERATIVE DIAGNOSIS:  Right Below Knee Amputation Dehiscence  POST-OPERATIVE DIAGNOSIS: Dehiscence right transtibial amputation  PROCEDURE:  Procedure(s): revision of  AMPUTATION BELOW KNEE  SURGEON:  Surgeon(s): [name], MD  ANESTHESIA:   general  EBL:  min ML  SPECIMEN:  No Specimen  TOURNIQUET:  * No tourniquets in log *  PROCEDURE DETAILS: Patient is a 64 old gentleman with severe peripheral vascular disease who has undergone prolonged limb salvage. An attempt to maintain patient's mobility patient wished to proceed with revision of the transtibial amputation as opposed to proceeding with above-knee amputation. Discussed increased risks of not healing. Patient states he understands was to proceed at this time. Description of procedure patient was brought to the operating room and underwent a general anesthetic. After adequate levels and anesthesia obtained patient's right lower extremity was prepped using Betadine paint and draped into a sterile field. A fishmouth incision was made around the dehiscence of the wound this was carried down to bone the distal 2 cm of bone was resected. [place] ties were used to ligate the popliteal vessels. All necrotic tissue was removed there was healthy viable skin flaps. The flaps were closed without tension the skin with 2-0 nylon and staples. The wound was covered with Adaptic orthopedic sponges AB dressing Kerlix and Coban. Patient was extubated taken to the PACU in stable condition.  PLAN OF CARE: Admit to inpatient   PATIENT DISPOSITION:  PACU - hemodynamically stable.   [name], MD 03/18/2014 6:34 PM

## 2014-03-18 NOTE — Anesthesia Preprocedure Evaluation (Addendum)
Anesthesia Evaluation  Patient identified by MRN, date of birth, ID band Patient awake    Reviewed: Allergy & Precautions, H&P , NPO status , Patient's Chart, lab work & pertinent test results  History of Anesthesia Complications Negative for: history of anesthetic complications  Airway Mallampati: II TM Distance: >3 FB Neck ROM: Full    Dental  (+) Dental Advisory Given, Chipped   Pulmonary neg pulmonary ROS, former smoker,  breath sounds clear to auscultation        Cardiovascular hypertension, Pt. on medications + Peripheral Vascular Disease + dysrhythmias (eloquis, off x 10d) Atrial Fibrillation + pacemaker Rhythm:Regular Rate:Normal  4/15 ECHO: EF 55-60%, valves Ok   Neuro/Psych TIAnegative neurological ROS     GI/Hepatic GERD-  Controlled and Medicated,  Endo/Other  diabetes (glu 104), Insulin DependentMorbid obesity  Renal/GU Renal InsufficiencyRenal disease (creat 1.53)     Musculoskeletal   Abdominal   Peds  Hematology  (+) Blood dyscrasia (Hb 8.3), anemia ,   Anesthesia Other Findings   Reproductive/Obstetrics                        Anesthesia Physical Anesthesia Plan  ASA: III  Anesthesia Plan: General   Post-op Pain Management:    Induction: Intravenous  Airway Management Planned: LMA  Additional Equipment:   Intra-op Plan:   Post-operative Plan:   Informed Consent: I have reviewed the patients History and Physical, chart, labs and discussed the procedure including the risks, benefits and alternatives for the proposed anesthesia with the patient or authorized representative who has indicated his/her understanding and acceptance.   Dental advisory given  Plan Discussed with: CRNA and Surgeon  Anesthesia Plan Comments: (Plan routine monitors, GA- LMA OK)        Anesthesia Quick Evaluation

## 2014-03-18 NOTE — Interval H&P Note (Signed)
History and Physical Interval Note:  03/18/2014 6:42 AM  [name]  has presented today for surgery, with the diagnosis of Right Below Knee Amputation Dehiscence  The various methods of treatment have been discussed with the patient and family. After consideration of risks, benefits and other options for treatment, the patient has consented to  Procedure(s) with comments: AMPUTATION BELOW KNEE (Right) - Revision Right Below Knee Amputation as a surgical intervention .  The patient's history has been reviewed, patient examined, no change in status, stable for surgery.  I have reviewed the patient's chart and labs.  Questions were answered to the patient's satisfaction.     [name]

## 2014-03-18 NOTE — H&P (View-Only) (Signed)
  Patient presents status post revision right transtibial amputation with dehiscence. Patient feels this is secondary to the aggressive physical therapy. Most likely do to microcirculation and residual effects of his blood thinner. Will plan for repeat irrigation and debridement and wound closure on Tuesday. 

## 2014-03-18 NOTE — Care Management Note (Signed)
    Page 1 of 1   03/20/2014     1:55:15 PM CARE MANAGEMENT NOTE 03/20/2014  Patient:  [name], [name]   Account Number:  [id number]  Date Initiated:  03/18/2014  Documentation initiated by:  Tomi Bamberger  Subjective/Objective Assessment:   dx wound dehiscence, r bka  admit- from [facility] snf     Action/Plan:   Anticipated DC Date:  03/20/2014   Anticipated DC Plan:  [place]  In-house referral  Clinical Social Worker      DC Planning Services  CM consult      Choice offered to / List presented to:             Status of service:  Completed, signed off Medicare Important Message given?  NO (If response is "NO", the following Medicare IM given date fields will be blank) Date Medicare IM given:   Date Additional Medicare IM given:  03/18/2014  Discharge Disposition:  SKILLED NURSING FACILITY  Per UR Regulation:  Reviewed for med. necessity/level of care/duration of stay  If discussed at [place] of Stay Meetings, dates discussed:    Comments:  03/19/14 [place], BSN [phone] NCM received referral for ltac, patient has [employer] which usually does not have Cross benefit for ltac, but NCM  asked Sonia Baller with ltac to take Cross look at patient, she will check on insurance.  Per ortho notes patient is for dc 4/30 .  CSW aware.  03/18/14 [place], BSN [phone] patient with wound dehiscence, patient for revision today, plan for dc 4/28 to snf, CSW following.

## 2014-03-18 NOTE — Anesthesia Procedure Notes (Signed)
Procedure Name: LMA Insertion Date/Time: 03/18/2014 6:07 PM Performed by: Ned Grace Pre-anesthesia Checklist: Patient identified, Patient being monitored, Emergency Drugs available, Timeout performed and Suction available Patient Re-evaluated:Patient Re-evaluated prior to inductionOxygen Delivery Method: Circle system utilized Preoxygenation: Pre-oxygenation with 100% oxygen Intubation Type: IV induction LMA: LMA inserted LMA Size: 4.0 Number of attempts: 1 Placement Confirmation: breath sounds checked- equal and bilateral and positive ETCO2 Tube secured with: Tape Dental Injury: Teeth and Oropharynx as per pre-operative assessment

## 2014-03-18 NOTE — Transfer of Care (Signed)
Immediate Anesthesia Transfer of Care Note  Patient: [name]  Procedure(s) Performed: Procedure(s) with comments: revision of  AMPUTATION BELOW KNEE (Right) - Revision Right Below Knee Amputation  Patient Location: PACU  Anesthesia Type:General  Level of Consciousness: awake, alert , oriented and patient cooperative  Airway & Oxygen Therapy: Patient Spontanous Breathing and Patient connected to nasal cannula oxygen  Post-op Assessment: Report given to PACU RN and Post -op Vital signs reviewed and stable  Post vital signs: Reviewed and stable  Complications: No apparent anesthesia complications

## 2014-03-18 NOTE — Progress Notes (Addendum)
Pt' s BS 105. Notified on call physician and asked if pt's scheduled 20u lantus should be given. Orders given to give lantus and recheck BS around 0100-0200. Lantus and snack given. Notified NT to check BS at that time. Will continue to monitor.

## 2014-03-18 NOTE — Progress Notes (Signed)
PATIENT DETAILS Name: [name] Age: 79 y.o. Sex: male Date of Birth: 1935/07/06 Admit Date: 03/15/2014 Admitting Physician Berle Mull, MD [id number],TOIZ EDWARD, MD  Subjective: No major complaints overnight, for OR later today  Assessment/Plan: Principal Problem:   Wound dehiscence of Rt BKA stump -admitted with bleeding from his Rt BKA stump, orthopedics has been consulted -for debridement and revision of Right BKA on 4/28 -c/w empiric Zosyn-day 4  Active Problems: Acute Blood loss anemia -secondary to blood loss from above -transfuse 2 units of PRBC on 4/26, repeat CBC this am 8.3. Continue to monitor periodically  Hx of Afib -rate controlled with Cardizem, Digoxin -Eliquis held since last discharge-4/20-given ongoing bleeding issues will continue to hold, will need to touch base with Dr Sharol Given before discharge, when to resume?  HTN -controlled with cardizem, lisinopril and Lasix  DM -CBG's controlled -c/w Lantus and SSI  Chronic Diastolic CHF -compensated -c/w lasix  GERD -stable, c/w PPI  S/P Permanent Pacemaker  Disposition: Remain inpatient-back to SNF on discharge  DVT Prophylaxis: SCD's  Code Status: Full code   Family Communication None at bedside  Procedures:  None  CONSULTS:  orthopedic surgery  MEDICATIONS: Scheduled Meds: .  ceFAZolin (ANCEF) IV  2 g Intravenous On Call to OR  . Chlorhexidine Gluconate Cloth  6 each Topical Daily  . digoxin  0.125 mg Oral Daily  . diltiazem  180 mg Oral Daily  . feeding supplement (GLUCERNA SHAKE)  237 mL Oral BID BM  . ferrous fumarate  1 tablet Oral Q1200  . furosemide  20 mg Intravenous Once  . furosemide  40 mg Oral Daily  . insulin aspart  0-15 Units Subcutaneous TID WC  . insulin aspart  0-5 Units Subcutaneous QHS  . insulin glargine  20 Units Subcutaneous QHS  . lisinopril  10 mg Oral Daily  . mupirocin ointment  1 application Nasal BID  . oxyCODONE  10 mg Oral Q6H  .  pantoprazole  40 mg Oral Daily  . piperacillin-tazobactam (ZOSYN)  IV  3.375 g Intravenous Q8H  . senna-docusate  1 tablet Oral Daily  . sodium chloride  3 mL Intravenous Q12H   Continuous Infusions:  PRN Meds:.acetaminophen, acetaminophen, LORazepam, ondansetron (ZOFRAN) IV, ondansetron, zolpidem  Antibiotics: Anti-infectives   Start     Dose/Rate Route Frequency Ordered Stop   03/18/14 0600  ceFAZolin (ANCEF) IVPB 2 g/50 mL premix     2 g 100 mL/hr over 30 Minutes Intravenous On call to O.R. 03/17/14 1414 03/19/14 0559   03/16/14 0600  piperacillin-tazobactam (ZOSYN) IVPB 3.375 g     3.375 g 12.5 mL/hr over 240 Minutes Intravenous Every 8 hours 03/15/14 2238     03/15/14 2245  doxycycline (VIBRA-TABS) tablet 100 mg  Status:  Discontinued     100 mg Oral 2 times daily 03/15/14 2233 03/16/14 0913   03/15/14 2245  piperacillin-tazobactam (ZOSYN) IVPB 3.375 g     3.375 g 100 mL/hr over 30 Minutes Intravenous  Once 03/15/14 2238 03/16/14 0020       PHYSICAL EXAM: Vital signs in last 24 hours: Filed Vitals:   03/17/14 1044 03/17/14 1420 03/17/14 2205 03/18/14 0546  BP: 130/56 99/55 129/61 148/62  Pulse:  72 62 66  Temp:  97.9 F (36.6 C) 98.1 F (36.7 C) 98.2 F (36.8 C)  TempSrc:  Oral Oral Oral  Resp:  18 18 16   Height:      Weight:  73.347 kg (161 lb 11.2 oz)  SpO2:  100% 98% 96%    Weight change: -2.404 kg (-5 lb 4.8 oz) Filed Weights   03/16/14 0500 03/17/14 0533 03/18/14 0546  Weight: 75.705 kg (166 lb 14.4 oz) 75.751 kg (167 lb) 73.347 kg (161 lb 11.2 oz)   Body mass index is 30.37 kg/(m^2).   Gen Exam: Awake and alert with clear speech.   Neck: Supple, No JVD.   Chest: B/L Clear.   CVS: S1 S2 Regular, no murmurs.  Abdomen: soft, BS +, non tender, non distended.  Extremities: no edema, Left lower extremities warm to touch.Right lower ext-BKA-dressing clean today, no blood seen Neurologic: Non Focal.   Skin: No Rash.   Wounds: [name].    Intake/Output from  previous day:  Intake/Output Summary (Last 24 hours) at 03/18/14 0847 Last data filed at 03/18/14 0744  Gross per 24 hour  Intake    886 ml  Output   2025 ml  Net  -1139 ml     LAB RESULTS: CBC  Recent Labs Lab 03/15/14 2019 03/16/14 0640 03/16/14 1835 03/17/14 0647 03/18/14 0655  WBC 13.8* 9.3 8.4 7.3 6.6  HGB 8.0* 6.4* 8.4* 8.0* 8.3*  HCT 25.0* 19.2* 24.5* 24.2* 25.0*  PLT 340 238 207 194 222  MCV 86.2 86.5 84.8 86.4 86.5  MCH 27.6 28.8 29.1 28.6 28.7  MCHC 32.0 33.3 34.3 33.1 33.2  RDW 15.5 15.7* 15.0 15.1 14.9  LYMPHSABS 5.3* 1.9  --   --   --   MONOABS 0.9 0.7  --   --   --   EOSABS 0.4 0.2  --   --   --   BASOSABS 0.0 0.0  --   --   --     Chemistries   Recent Labs Lab 03/15/14 2019 03/16/14 0640 03/17/14 0647 03/18/14 0655  NA 133* 135* 135* 136*  K 4.5 5.0 4.7 4.3  CL 97 101 100 99  CO2 20 22 23 25   GLUCOSE 258* 160* 145* 113*  BUN 32* 37* 37* 30*  CREATININE 1.28 1.37* 1.57* 1.53*  CALCIUM 8.6 8.2* 8.4 8.8    CBG:  Recent Labs Lab 03/17/14 0749 03/17/14 1205 03/17/14 1644 03/17/14 2145 03/18/14 0743  GLUCAP 132* 151* 184* 154* 114*    GFR Estimated Creatinine Clearance: 35.4 ml/min (by C-G formula based on Cr of 1.53).  Coagulation profile  Recent Labs Lab 03/15/14 2019  INR 1.29    Cardiac Enzymes No results found for this basename: CK, CKMB, TROPONINI, MYOGLOBIN,  in the last 168 hours  No components found with this basename: POCBNP,  No results found for this basename: DDIMER,  in the last 72 hours No results found for this basename: HGBA1C,  in the last 72 hours No results found for this basename: CHOL, HDL, LDLCALC, TRIG, CHOLHDL, LDLDIRECT,  in the last 72 hours No results found for this basename: TSH, T4TOTAL, FREET3, T3FREE, THYROIDAB,  in the last 72 hours No results found for this basename: VITAMINB12, FOLATE, FERRITIN, TIBC, IRON, RETICCTPCT,  in the last 72 hours No results found for this basename: LIPASE,  AMYLASE,  in the last 72 hours  Urine Studies No results found for this basename: UACOL, UAPR, USPG, UPH, UTP, UGL, UKET, UBIL, UHGB, UNIT, UROB, ULEU, UEPI, UWBC, URBC, UBAC, CAST, CRYS, UCOM, BILUA,  in the last 72 hours  MICROBIOLOGY: Recent Results (from the past 240 hour(s))  CULTURE, BLOOD (ROUTINE X 2)     Status: None  Collection Time    03/15/14 11:35 PM      Result Value Ref Range Status   Specimen Description BLOOD LEFT ARM   Final   Special Requests BOTTLES DRAWN AEROBIC ONLY [facility]   Final   Culture  Setup Time     Final   Value: 03/16/2014 06:17     Performed at [facility]   Culture     Final   Value:        BLOOD CULTURE RECEIVED NO GROWTH TO DATE CULTURE WILL BE HELD FOR 5 DAYS BEFORE ISSUING A FINAL NEGATIVE REPORT     Performed at [facility]   Report Status PENDING   Incomplete  MRSA PCR SCREENING     Status: None   Collection Time    03/15/14 11:36 PM      Result Value Ref Range Status   MRSA by PCR NEGATIVE  NEGATIVE Final   Comment:            The GeneXpert MRSA Assay (FDA     approved for NASAL specimens     only), is one component of a     comprehensive MRSA colonization     surveillance program. It is not     intended to diagnose MRSA     infection nor to guide or     monitor treatment for     MRSA infections.  CULTURE, BLOOD (ROUTINE X 2)     Status: None   Collection Time    03/15/14 11:41 PM      Result Value Ref Range Status   Specimen Description BLOOD RIGHT ARM   Final   Special Requests BOTTLES DRAWN AEROBIC ONLY 5CC   Final   Culture  Setup Time     Final   Value: 03/16/2014 06:17     Performed at [facility]   Culture     Final   Value:        BLOOD CULTURE RECEIVED NO GROWTH TO DATE CULTURE WILL BE HELD FOR 5 DAYS BEFORE ISSUING A FINAL NEGATIVE REPORT     Performed at [facility]   Report Status PENDING   Incomplete  SURGICAL PCR SCREEN     Status: Abnormal   Collection Time    03/17/14  5:14 PM        Result Value Ref Range Status   MRSA, PCR NEGATIVE  NEGATIVE Final   Staphylococcus aureus POSITIVE (*) NEGATIVE Final   Comment:            The Xpert SA Assay (FDA     approved for NASAL specimens     in patients over 7 years of age),     is one component of     a comprehensive surveillance     program.  Test performance has     been validated by [employer] for patients greater     than or equal to 16 year old.     It is not intended     to diagnose infection nor to     guide or monitor treatment.    RADIOLOGY STUDIES/RESULTS: No results found.  Jonetta Osgood, MD  [facility] Pager:[phone]  If 7PM-7AM, please contact night-coverage [email or web address] Password [facility] 03/18/2014, 8:47 AM   LOS: 3 days

## 2014-03-19 DIAGNOSIS — L570 Actinic keratosis: Secondary | ICD-10-CM

## 2014-03-19 DIAGNOSIS — M7500 Adhesive capsulitis of unspecified shoulder: Secondary | ICD-10-CM

## 2014-03-19 LAB — CBC
HCT: 24.8 % — ABNORMAL LOW (ref 39.0–52.0)
Hemoglobin: 8.2 g/dL — ABNORMAL LOW (ref 13.0–17.0)
MCH: 28.6 pg (ref 26.0–34.0)
MCHC: 33.1 g/dL (ref 30.0–36.0)
MCV: 86.4 fL (ref 78.0–100.0)
Platelets: 275 10*3/uL (ref 150–400)
RBC: 2.87 MIL/uL — AB (ref 4.22–5.81)
RDW: 14.9 % (ref 11.5–15.5)
WBC: 8.3 10*3/uL (ref 4.0–10.5)

## 2014-03-19 LAB — BASIC METABOLIC PANEL
BUN: 25 mg/dL — ABNORMAL HIGH (ref 6–23)
CALCIUM: 8.8 mg/dL (ref 8.4–10.5)
CO2: 26 meq/L (ref 19–32)
Chloride: 103 mEq/L (ref 96–112)
Creatinine, Ser: 1.4 mg/dL — ABNORMAL HIGH (ref 0.50–1.35)
GFR calc Af Amer: 55 mL/min — ABNORMAL LOW (ref >90)
GFR calc non Af Amer: 47 mL/min — ABNORMAL LOW (ref >90)
GLUCOSE: 106 mg/dL — AB (ref 70–99)
Potassium: 4.1 mEq/L (ref 3.7–5.3)
SODIUM: 141 meq/L (ref 137–147)

## 2014-03-19 LAB — GLUCOSE, CAPILLARY
GLUCOSE-CAPILLARY: 168 mg/dL — AB (ref 70–99)
Glucose-Capillary: 88 mg/dL (ref 70–99)
Glucose-Capillary: 267 mg/dL — ABNORMAL HIGH (ref 70–99)
Glucose-Capillary: 132 mg/dL — ABNORMAL HIGH (ref 70–99)

## 2014-03-19 MED ORDER — OXYCODONE HCL 5 MG PO TABS
10.0000 mg | ORAL_TABLET | ORAL | Status: DC | PRN
  Administered 2014-03-19 – 2014-03-20 (×6): 10 mg via ORAL
  Filled 2014-03-19 (×6): qty 2

## 2014-03-19 NOTE — Progress Notes (Signed)
PT Cancellation Note  Patient Details Name: [name] MRN: [id number] DOB: 10-13-1935   Cancelled Treatment:    Reason Eval/Treat Not Completed: Other (comment); patient refused mobility today due to anxious about right BKA movement after revision done yesterday and multiple complications with wound dehiscence and hemorrhage.  Will attempt to see tomorrow.   Max Sane 03/19/2014, 12:10 PM

## 2014-03-19 NOTE — Anesthesia Postprocedure Evaluation (Signed)
  Anesthesia Post-op Note  Patient: [name]  Procedure(s) Performed: Procedure(s) with comments: revision of  AMPUTATION BELOW KNEE (Right) - Revision Right Below Knee Amputation  Patient Location: PACU  Anesthesia Type:General  Level of Consciousness: awake and alert   Airway and Oxygen Therapy: Patient Spontanous Breathing  Post-op Pain: mild  Post-op Assessment: Post-op Vital signs reviewed, Patient's Cardiovascular Status Stable, Respiratory Function Stable, Patent Airway, No signs of Nausea or vomiting and Pain level controlled  Post-op Vital Signs: Reviewed and stable  Last Vitals:  Filed Vitals:   03/19/14 0553  BP: 117/70  Pulse: 75  Temp: 36.9 C  Resp: 18    Complications: No apparent anesthesia complications

## 2014-03-19 NOTE — ED Provider Notes (Signed)
This is an addendum note on patient per [place] date of service 03/08/2014 physical exam well-developed well-nourished male who appears generally weak and somewhat ill. Head eyes ears and throat normocephalic atraumatic pupils equal round react to light extraocular movements are intact his conjunctivae are pale Neck trachea is midline living JVD is noted it is supple Chest wall normal exam heart regular rate and rhythm abdomen soft and nontender Extremities right lower extremity is significant for having a bloodsoaked bandage in place over a right BKA. The bandage is removed and blood was cleared. Staple line is intact with continued oozing of blood noted. Neurologically patient is awake and alert and oriented x3 speaks to me clearly moves right upper extremity and left upper extremity equally cranial nerves II through XII are grossly intact. Skin is at the upper extremity exam  Please see the remainder of my note for the rest of his history of present illness and disposition.  Shaune Pollack, MD 03/19/14 [phone]

## 2014-03-19 NOTE — Progress Notes (Signed)
Patient ID: [name], male   DOB: [birth year], 79 y.o.   MRN: [id number] Postoperative day 1 status post revision right transtibial amputation. Patient's dressing is clean dry and intact this morning. Patient is comfortable this morning. Anticipate patient could be discharged back to skilled nursing tomorrow.

## 2014-03-19 NOTE — Progress Notes (Signed)
[facility] PROGRESS NOTE  [name] IFO:[id number] DOB: 1934/12/21 DOA: 03/15/2014 PCP: Georgetta Haber, MD HPI Patient is a 79 year old man with a history of right BKA stump wound dehiscence,  This is a repeat surgical closure of his Right Transtibial amputation.  He was on the restroom straining for a BM when the wound reopened,  EMS reported 200cc-300cc of blood loss.    Subjective He reports a restful night after the surgical procedure.  Moderate Increase in pain between his pain medication (around the 4 hr mark)   Assessment/Plan:  Wound dehiscence of Rt BKA stump   Admitted with bleeding from Rt. BKA.   Orthopedics performed with closer on his right transtibial amputation  Wound and bandage looks clean and dry, Sutures/staple intact,  No signs of bleeding  Pt complains of increased pain after surgery  Modified the interval of oxycodone from q6h to q4h for better pain management.  Orthopedics following patient, recommended discharge tomorrow.  Acute Blood loss anemia   4/26 patient's hemoglobin at 6.4, baseline was around 8.0 from previous blood  2U of PRBC transfusion  Patients H/H stable since with 8.4 / 25.0 average   Stable, asymptomatic -- resolved  Hx of Afib   Rate controlled with Cardizem, Digoxin  Eliquis held since his last discharge on 4/20.  Past  History of bleeding issues will hold.    Will consult Dr Sharol Given (orthopedics) about anti-cog and when to resume.  HTN   Controlled with Cardizem, Lisinopril, Lasix  DM   CBG's controlled with Lantus, and SSI  Chronic Diastolic CHF   Compensated with lasix  GERD  Continue with PPI  S/P Permanent Pacemaker  DVT Prophylaxis:  SCD's   Code Status: Full Family Communication: none Disposition Plan: discharge to SNF tomorrow  Consultants:  orthopedics  Procedures:  Dehiscence right transtibial amputation  Antibiotics:  Zosyn   Objective: Filed Vitals:   03/19/14  0955  BP: 97/59  Pulse: 108  Temp:   Resp:     Intake/Output Summary (Last 24 hours) at 03/19/14 1001 Last data filed at 03/19/14 0502  Gross per 24 hour  Intake    300 ml  Output   1100 ml  Net   -800 ml   Filed Weights   03/17/14 0533 03/18/14 0546 03/19/14 0553  Weight: 75.751 kg (167 lb) 73.347 kg (161 lb 11.2 oz) 72.485 kg (159 lb 12.8 oz)    Exam:  Physical Exam  Constitutional: He is oriented to person, place, and time. He appears well-developed and well-nourished. No distress.  HENT:   Head: Normocephalic and atraumatic.   Eyes: Pupils are equal, round, and reactive to light.  Neck: No JVD present.  Cardiovascular: Normal rate, regular rhythm and normal heart sounds.   Respiratory: Effort normal and breath sounds normal. No stridor. No respiratory distress. He has no wheezes. He exhibits no tenderness.  GI: Soft. Bowel sounds are normal. He exhibits no distension. There is no tenderness. There is no rebound.  Musculoskeletal: Normal range of motion. She exhibits no tenderness.  Neurological: He is alert and oriented to person, place, and time.  Skin: Skin is warm and dry. He is not diaphoretic. No erythema.   Surgical sight Below right Knee: no drainage of any kind, bandage appears dry as well Psychiatric: She has a normal mood and affect. His speech is normal and behavior is normal. Judgment and thought content normal. Cognition and memory are normal.    Data Reviewed: Basic Metabolic Panel:  Recent Labs Lab 03/15/14 2019 03/16/14 0640 03/17/14 0647 03/18/14 0655 03/19/14 0543  NA 133* 135* 135* 136* 141  K 4.5 5.0 4.7 4.3 4.1  CL 97 101 100 99 103  CO2 20 22 23 25 26   GLUCOSE 258* 160* 145* 113* 106*  BUN 32* 37* 37* 30* 25*  CREATININE 1.28 1.37* 1.57* 1.53* 1.40*  CALCIUM 8.6 8.2* 8.4 8.8 8.8   Liver Function Tests:  Recent Labs Lab 03/15/14 2019 03/16/14 0640  AST 13 12  ALT 10 9  ALKPHOS 122* 98  BILITOT 0.4 0.4  PROT 6.1 5.5*  ALBUMIN  2.3* 2.2*   CBC:  Recent Labs Lab 03/15/14 2019 03/16/14 0640 03/16/14 1835 03/17/14 0647 03/18/14 0655 03/19/14 0543  WBC 13.8* 9.3 8.4 7.3 6.6 8.3  NEUTROABS 7.1 6.4  --   --   --   --   HGB 8.0* 6.4* 8.4* 8.0* 8.3* 8.2*  HCT 25.0* 19.2* 24.5* 24.2* 25.0* 24.8*  MCV 86.2 86.5 84.8 86.4 86.5 86.4  PLT 340 238 207 194 222 275    BNP (last 3 results)  Recent Labs  01/16/14 2043  PROBNP 3148.0*   CBG:  Recent Labs Lab 03/18/14 0743 03/18/14 1227 03/18/14 1558 03/18/14 2117 03/19/14 0827  GLUCAP 114* 107* 104* 105* 88    Recent Results (from the past 240 hour(s))  CULTURE, BLOOD (ROUTINE X 2)     Status: None   Collection Time    03/15/14 11:35 PM      Result Value Ref Range Status   Specimen Description BLOOD LEFT ARM   Final   Special Requests BOTTLES DRAWN AEROBIC ONLY 6CC   Final   Culture  Setup Time     Final   Value: 03/16/2014 06:17     Performed at [facility]   Culture     Final   Value:        BLOOD CULTURE RECEIVED NO GROWTH TO DATE CULTURE WILL BE HELD FOR 5 DAYS BEFORE ISSUING A FINAL NEGATIVE REPORT     Performed at [facility]   Report Status PENDING   Incomplete  MRSA PCR SCREENING     Status: None   Collection Time    03/15/14 11:36 PM      Result Value Ref Range Status   MRSA by PCR NEGATIVE  NEGATIVE Final   Comment:            The GeneXpert MRSA Assay (FDA     approved for NASAL specimens     only), is one component of a     comprehensive MRSA colonization     surveillance program. It is not     intended to diagnose MRSA     infection nor to guide or     monitor treatment for     MRSA infections.  CULTURE, BLOOD (ROUTINE X 2)     Status: None   Collection Time    03/15/14 11:41 PM      Result Value Ref Range Status   Specimen Description BLOOD RIGHT ARM   Final   Special Requests BOTTLES DRAWN AEROBIC ONLY 5CC   Final   Culture  Setup Time     Final   Value: 03/16/2014 06:17     Performed at [facility]   Culture     Final   Value:        BLOOD CULTURE RECEIVED NO GROWTH TO DATE CULTURE WILL BE HELD FOR 5 DAYS  BEFORE ISSUING A FINAL NEGATIVE REPORT     Performed at [facility]   Report Status PENDING   Incomplete  SURGICAL PCR SCREEN     Status: Abnormal   Collection Time    03/17/14  5:14 PM      Result Value Ref Range Status   MRSA, PCR NEGATIVE  NEGATIVE Final   Staphylococcus aureus POSITIVE (*) NEGATIVE Final   Comment:            The Xpert SA Assay (FDA     approved for NASAL specimens     in patients over 77 years of age),     is one component of     a comprehensive surveillance     program.  Test performance has     been validated by [employer] for patients greater     than or equal to 33 year old.     It is not intended     to diagnose infection nor to     guide or monitor treatment.     Studies: No results found.  Scheduled Meds: . Chlorhexidine Gluconate Cloth  6 each Topical Daily  . digoxin  0.125 mg Oral Daily  . diltiazem  180 mg Oral Daily  . feeding supplement (GLUCERNA SHAKE)  237 mL Oral BID BM  . ferrous fumarate  1 tablet Oral Q1200  . furosemide  20 mg Intravenous Once  . furosemide  40 mg Oral Daily  . insulin aspart  0-15 Units Subcutaneous TID WC  . insulin aspart  0-5 Units Subcutaneous QHS  . insulin glargine  20 Units Subcutaneous QHS  . lisinopril  10 mg Oral Daily  . mupirocin ointment  1 application Nasal BID  . pantoprazole  40 mg Oral Daily  . piperacillin-tazobactam (ZOSYN)  IV  3.375 g Intravenous Q8H  . senna-docusate  1 tablet Oral Daily  . sodium chloride  3 mL Intravenous Q12H   Continuous Infusions:   Principal Problem:   Wound dehiscence Active Problems:   HYPERTENSION   Atrial fibrillation   Chronic diastolic CHF (congestive heart failure)   GERD   Pacemaker- [name]   Anemia   Diabetes mellitus    Time spent: [place], [place]    [facility]  If 7PM-7AM,  please contact night-coverage at [email or web address], password [facility] 03/19/2014, 10:01 AM  LOS: 4 days     Addendum  Patient seen and examined, chart and data base reviewed.  I agree with the above assessment and plan.  For full details please see Mr. Martyn Ehrich note.   Birdie Hopes, MD [facility] Pager: [phone] 03/19/2014, 1:13 PM

## 2014-03-19 NOTE — Progress Notes (Signed)
CSW continues to follow patient for dc back to SNF tomorrow. CSW updated facility.  Jeanette Caprice, MSW, [place]

## 2014-03-19 NOTE — Discharge Instructions (Signed)
Change dressing when necessary °

## 2014-03-19 NOTE — Progress Notes (Signed)
03/19/14 0955  Vitals  BP ! 97/59 mmHg  BP Location Left arm  BP Method Automatic  Pulse Rate ! 108  Pulse Rate Source Dinamap   Held lisinopril  Joslyn Hy, MSN, RN, [name]

## 2014-03-20 ENCOUNTER — Other Ambulatory Visit: Payer: Self-pay

## 2014-03-20 ENCOUNTER — Encounter (HOSPITAL_COMMUNITY): Payer: Self-pay | Admitting: Orthopedic Surgery

## 2014-03-20 DIAGNOSIS — D649 Anemia, unspecified: Secondary | ICD-10-CM

## 2014-03-20 LAB — BASIC METABOLIC PANEL
BUN: 25 mg/dL — AB (ref 6–23)
CHLORIDE: 98 meq/L (ref 96–112)
CO2: 26 mEq/L (ref 19–32)
Calcium: 8.7 mg/dL (ref 8.4–10.5)
Creatinine, Ser: 1.5 mg/dL — ABNORMAL HIGH (ref 0.50–1.35)
GFR calc Af Amer: 50 mL/min — ABNORMAL LOW (ref >90)
GFR calc non Af Amer: 43 mL/min — ABNORMAL LOW (ref >90)
Glucose, Bld: 95 mg/dL (ref 70–99)
POTASSIUM: 4.1 meq/L (ref 3.7–5.3)
Sodium: 137 mEq/L (ref 137–147)

## 2014-03-20 LAB — CBC
HEMATOCRIT: 23.8 % — AB (ref 39.0–52.0)
HEMOGLOBIN: 7.8 g/dL — AB (ref 13.0–17.0)
MCH: 28.5 pg (ref 26.0–34.0)
MCHC: 32.8 g/dL (ref 30.0–36.0)
MCV: 86.9 fL (ref 78.0–100.0)
Platelets: 264 10*3/uL (ref 150–400)
RBC: 2.74 MIL/uL — ABNORMAL LOW (ref 4.22–5.81)
RDW: 15.1 % (ref 11.5–15.5)
WBC: 8.1 10*3/uL (ref 4.0–10.5)

## 2014-03-20 LAB — GLUCOSE, CAPILLARY
GLUCOSE-CAPILLARY: 88 mg/dL (ref 70–99)
GLUCOSE-CAPILLARY: 186 mg/dL — AB (ref 70–99)

## 2014-03-20 MED ORDER — LORAZEPAM 0.5 MG PO TABS: 0.2500 mg | ORAL_TABLET | Freq: Two times a day (BID) | ORAL | Status: DC | PRN

## 2014-03-20 MED ORDER — OXYCODONE HCL 10 MG PO TABS: 10.0000 mg | ORAL_TABLET | Freq: Four times a day (QID) | ORAL | Status: DC

## 2014-03-20 MED ORDER — DOXYCYCLINE HYCLATE 100 MG PO TABS: 100.0000 mg | ORAL_TABLET | Freq: Two times a day (BID) | ORAL | Status: DC

## 2014-03-20 MED ORDER — CEPHALEXIN 500 MG PO CAPS: 500.0000 mg | ORAL_CAPSULE | Freq: Four times a day (QID) | ORAL | Status: DC

## 2014-03-20 NOTE — Evaluation (Signed)
Physical Therapy Evaluation Patient Details Name: [name] MRN: [id number] DOB: [birth year] Today's Date: 03/20/2014   History of Present Illness  Pt admitted with wound dehiscence of Rt BKA. Pt underwent rt BKA on 01/20/14. Pt with repeated dehiscence and closures with most recent occuring while straining on commode  Clinical Impression  Pt initially agreeable to increased mobility but once mobility initiated pt denied OOB. Pt very apprehensive about moving and creating further injury to Right BKA. Pt educated for positioning, bil LE HEP and progression of weakness and mobility. Pt requires assist for room setup and able to demonstrate hip flexion and quad set x 1 on RLE. Pt will benefit from acute therapy to maximize mobility, strength and function with recommendation to return to SNF to maximize independence.     Follow Up Recommendations SNF;Supervision for mobility/OOB    Equipment Recommendations  None recommended by PT    Recommendations for Other Services       Precautions / Restrictions Precautions Precautions: Fall Restrictions Weight Bearing Restrictions: No RLE Weight Bearing: Non weight bearing      Mobility  Bed Mobility Overal bed mobility: Needs Assistance Bed Mobility: Supine to Sit;Sit to Supine     Supine to sit: Supervision Sit to supine: Supervision   General bed mobility comments: supervision for line management and room setup with increased time. pt denied assist as wanting to control movement and pain  Transfers                 General transfer comment: pt initially stating he would transfer to chair but once room setup comlete and pt  EOB he denied further mobility and returned to supine with assist for bed management and requested room setup be returned to prior position  Ambulation/Gait                Stairs            Wheelchair Mobility    Modified Rankin (Stroke Patients Only)       Balance Overall balance  assessment: Needs assistance   Sitting balance-Leahy Scale: Poor                                       Pertinent Vitals/Pain Pain 6/10 RLE with activity, repositioned, RN notified    Home Living Family/patient expects to be discharged to:: Skilled nursing facility                      Prior Function Level of Independence: Needs assistance   Gait / Transfers Assistance Needed: pt states he has been able to transfer bed to [facility] and toilet on his own since [place] and has been walking 37' with PT at SNF  ADL's / [place] Needed: staff assisting with all ADLs and housework  Comments: prior to BKA was at home and independent with spouse     Hand Dominance        Extremity/Trunk Assessment   Upper Extremity Assessment: Generalized weakness           Lower Extremity Assessment: Generalized weakness;RLE deficits/detail;LLE deficits/detail   LLE Deficits / Details: grossly at least 2+/5 pt refused HEP or full assessment of either LE  Cervical / Trunk Assessment: Normal  Communication   Communication: No difficulties  Cognition Arousal/Alertness: Awake/alert Behavior During Therapy: WFL for tasks assessed/performed Overall Cognitive Status: Within Functional Limits for tasks assessed  General Comments      Exercises        Assessment/Plan    PT Assessment Patient needs continued PT services  PT Diagnosis Difficulty walking;Generalized weakness;Acute pain   PT Problem List Decreased strength;Decreased activity tolerance;Decreased mobility;Decreased balance;Pain  PT Treatment Interventions DME instruction;Functional mobility training;Therapeutic activities;Therapeutic exercise;Patient/family education   PT Goals (Current goals can be found in the Care Plan section) Acute Rehab PT Goals Patient Stated Goal: be able to walk and return home PT Goal Formulation: With patient Time For Goal Achievement:  04/03/14 Potential to Achieve Goals: Fair    Frequency Min 2X/week   Barriers to discharge        Co-evaluation               End of Session   Activity Tolerance: Patient limited by pain Patient left: in bed;with call bell/phone within reach           Time: 1107-1128 PT Time Calculation (min): 21 min   Charges:   PT Evaluation $Initial PT Evaluation Tier I: 1 Procedure PT Treatments $Therapeutic Activity: 8-22 mins   PT G Codes:          Nimo Verastegui B Feras Gardella 03/20/2014, 11:37 AM Elwyn Reach, [place]

## 2014-03-20 NOTE — Discharge Summary (Addendum)
Physician Discharge Summary  [name] FXT:[id number] DOB: 06-09-1935 DOA: 03/15/2014  PCP: Georgetta Haber, MD  Admit date: 03/15/2014 Discharge date: 03/20/2014  Time spent: 40 minutes  Recommendations for Outpatient Follow-up:  1. Followup with the nursing home M.D. 2. Patient is on Eliquis which was held because of previous bleeding, continue holding for 1 week, restart on 03/27/2014. 3. Discontinue Keflex and doxycycline on 04/07/2014.  Discharge Diagnoses:  Principal Problem:   Wound dehiscence Active Problems:   HYPERTENSION   Atrial fibrillation   Chronic diastolic CHF (congestive heart failure)   GERD   Pacemaker- [name]   Anemia   Diabetes mellitus   Discharge Condition: Stable  Diet recommendation: Carbohydrate modified diet  Filed Weights   03/18/14 0546 03/19/14 0553 03/20/14 0506  Weight: 73.347 kg (161 lb 11.2 oz) 72.485 kg (159 lb 12.8 oz) 71.6 kg (157 lb 13.6 oz)    History of present illness:  [name] is a 79 y.o. male with Past medical history of diabetes mellitus, Pseudomonas osteomyelitis, tachybradycardia syndrome status post pacemaker implant, hypertension, A. fib was on anticoagulation, recent 2 transtibial amputation and recurrent wound dehiscence.  The patient presented with complaints of bleeding from his wound. He mentions that after his recent discharge he was doing okay, he had physical therapy today which involved a new exercise earlier in the morning, around evening when the patient was having a bowel movement, he heard a pop and found that his wound was bleeding significantly. EMS reported 200-300 cc of blood loss. This was followed by fatigue, dizziness and tiredness. EMS was called in and the patient was brought here. The bleeding stopped with local pressure at present patient is not having any bleeding.  Pt denies any fever, chills, headache, cough, chest pain, palpitation, shortness of breath, orthopnea, PND, nausea,  vomiting, abdominal pain, diarrhea burning urination, dizziness, pedal edema, focal neurological deficit.  He complains of diarrhea. He mentions he is eating and drinking okay.  He denies getting any anticoagulation in the nursing home.  The patient is coming from SNF. And at his baseline independent for most of his ADL.  Hospital Course:   Wound dehiscence of Rt BKA stump  Admitted with bleeding from Rt. BKA.  Orthopedics revised his right transtibial amputation. Wound and bandage looks clean and dry, Sutures/staple intact, No signs of bleeding  Pain is controlled, denies other complaints. On discharge to flex and on doxycycline continued, prior recommendation to discontinue on 04/07/2014.  Acute Blood loss anemia  4/26 patient's hemoglobin at 6.4, baseline was around 8.0 from previous blood  2U of PRBC transfusion  Patients H/H stable since with 8.4 / 25.0 average  Stable, asymptomatic -- resolved  Hx of Afib  Rate controlled with Cardizem, Digoxin  Eliquis held since his last discharge on 4/20. Past History of bleeding issues will hold.  We'll resume the anticoagulation in 1 week.  HTN  Controlled with Cardizem, Lisinopril, Lasix  DM  CBG's controlled with Lantus, and SSI  Chronic Diastolic CHF  Compensated with lasix  GERD  Continue with PPI  S/P Permanent Pacemaker   Procedures:  Revision of right BKA for wound Dehiscence.  Consultations:  Dr. Sharol Given of orthopedic service  Discharge Exam: Filed Vitals:   03/20/14 0506  BP: 132/70  Pulse: 66  Temp: 98.4 F (36.9 C)  Resp: 16   General: Alert and awake, oriented x3, not in any acute distress. HEENT: anicteric sclera, pupils reactive to light and accommodation, EOMI CVS: S1-S2 clear,  no murmur rubs or gallops Chest: clear to auscultation bilaterally, no wheezing, rales or rhonchi Abdomen: soft nontender, nondistended, normal bowel sounds, no organomegaly Extremities: no cyanosis, clubbing or edema noted  bilaterally Neuro: Cranial nerves II-XII intact, no focal neurological deficits  Discharge Instructions You were cared for by a hospitalist during your hospital stay. If you have any questions about your discharge medications or the care you received while you were in the hospital after you are discharged, you can call the unit and asked to speak with the hospitalist on call if the hospitalist that took care of you is not available. Once you are discharged, your primary care physician will handle any further medical issues. Please note that NO REFILLS for any discharge medications will be authorized once you are discharged, as it is imperative that you return to your primary care physician (or establish a relationship with a primary care physician if you do not have one) for your aftercare needs so that they can reassess your need for medications and monitor your lab values.      Discharge Orders   Future Appointments Provider Department Dept Phone   04/07/2014 11:15 AM Truman Hayward, MD [facility] [phone]   05/15/2014 2:15 PM Wallene Huh, [place] [facility] [place]   Future Orders Complete By Expires   Diet Carb Modified  As directed    Increase activity slowly  As directed        Medication List         cephALEXin 500 MG capsule  Commonly known as:  KEFLEX  Take 1 capsule (500 mg total) by mouth every 6 (six) hours.     DECUBI-VITE Caps  Take 1 capsule by mouth daily.     digoxin 0.125 MG tablet  Commonly known as:  LANOXIN  Take 0.125 mg by mouth daily.     diltiazem 180 MG 24 hr capsule  Commonly known as:  CARDIZEM CD  Take 1 capsule (180 mg total) by mouth daily.     doxycycline 100 MG tablet  Commonly known as:  VIBRA-TABS  Take 1 tablet (100 mg total) by mouth 2 (two) times daily.     feeding supplement (GLUCERNA 1.2 CAL) Liqd  Take by mouth 2 (two) times daily.     ferrous fumarate 325 (106  FE) MG Tabs tablet  Commonly known as:  HEMOCYTE - 106 mg FE  Take 1 tablet (106 mg of iron total) by mouth daily at 12 noon.     furosemide 40 MG tablet  Commonly known as:  LASIX  Take 40 mg by mouth daily.     Insulin Glargine 100 UNIT/ML Solostar Pen  Commonly known as:  LANTUS SOLOSTAR  Inject 20 Units into the skin at bedtime.     lisinopril 10 MG tablet  Commonly known as:  PRINIVIL,ZESTRIL  Take 10 mg by mouth daily.     LORazepam 0.5 MG tablet  Commonly known as:  ATIVAN  Take 0.5 tablets (0.25 mg total) by mouth 2 (two) times daily as needed for anxiety.     Melatonin 5 MG Tabs  Take 5 mg by mouth at bedtime as needed (insomnia).     NOVOLOG FLEXPEN 100 UNIT/ML FlexPen  Generic drug:  insulin aspart  Inject 0-10 Units into the skin 3 (three) times daily with meals. Per sliding scale:  cbg 150-250 5 units, 251-300 8 units, 301-500 10 units (call MD if over  350)     omeprazole 20 MG capsule  Commonly known as:  PRILOSEC  Take 20 mg by mouth 2 (two) times daily before a meal.     Oxycodone HCl 10 MG Tabs  Take 1 tablet (10 mg total) by mouth every 6 (six) hours. Scheduled (hold for sedation)     SENNA-S 8.6-50 MG per tablet  Generic drug:  senna-docusate  Take 1 tablet by mouth daily.     silver sulfADIAZINE 1 % cream  Commonly known as:  SILVADENE  Apply 1 application topically daily.       Allergies  Allergen Reactions  . Morphine Rash and Other (See Comments)    Irritability also   Follow-up Information   Follow up with DUDA,MARCUS V, MD In 2 weeks.   Specialty:  Orthopedic Surgery   Contact information:   [place] [address] [phone]        The results of significant diagnostics from this hospitalization (including imaging, microbiology, ancillary and laboratory) are listed below for reference.    Significant Diagnostic Studies: No results found.  Microbiology: Recent Results (from the past 240 hour(s))  CULTURE,  BLOOD (ROUTINE X 2)     Status: None   Collection Time    03/15/14 11:35 PM      Result Value Ref Range Status   Specimen Description BLOOD LEFT ARM   Final   Special Requests BOTTLES DRAWN AEROBIC ONLY 6CC   Final   Culture  Setup Time     Final   Value: 03/16/2014 06:17     Performed at [facility]   Culture     Final   Value:        BLOOD CULTURE RECEIVED NO GROWTH TO DATE CULTURE WILL BE HELD FOR 5 DAYS BEFORE ISSUING A FINAL NEGATIVE REPORT     Performed at [facility]   Report Status PENDING   Incomplete  MRSA PCR SCREENING     Status: None   Collection Time    03/15/14 11:36 PM      Result Value Ref Range Status   MRSA by PCR NEGATIVE  NEGATIVE Final   Comment:            The GeneXpert MRSA Assay (FDA     approved for NASAL specimens     only), is one component of a     comprehensive MRSA colonization     surveillance program. It is not     intended to diagnose MRSA     infection nor to guide or     monitor treatment for     MRSA infections.  CULTURE, BLOOD (ROUTINE X 2)     Status: None   Collection Time    03/15/14 11:41 PM      Result Value Ref Range Status   Specimen Description BLOOD RIGHT ARM   Final   Special Requests BOTTLES DRAWN AEROBIC ONLY 5CC   Final   Culture  Setup Time     Final   Value: 03/16/2014 06:17     Performed at [facility]   Culture     Final   Value:        BLOOD CULTURE RECEIVED NO GROWTH TO DATE CULTURE WILL BE HELD FOR 5 DAYS BEFORE ISSUING A FINAL NEGATIVE REPORT     Performed at [facility]   Report Status PENDING   Incomplete  SURGICAL PCR SCREEN     Status: Abnormal  Collection Time    03/17/14  5:14 PM      Result Value Ref Range Status   MRSA, PCR NEGATIVE  NEGATIVE Final   Staphylococcus aureus POSITIVE (*) NEGATIVE Final   Comment:            The Xpert SA Assay (FDA     approved for NASAL specimens     in patients over 62 years of age),     is one component of     a comprehensive  surveillance     program.  Test performance has     been validated by [employer] for patients greater     than or equal to 62 year old.     It is not intended     to diagnose infection nor to     guide or monitor treatment.     Labs: Basic Metabolic Panel:  Recent Labs Lab 03/16/14 0640 03/17/14 0647 03/18/14 0655 03/19/14 0543 03/20/14 0645  NA 135* 135* 136* 141 137  K 5.0 4.7 4.3 4.1 4.1  CL 101 100 99 103 98  CO2 22 23 25 26 26   GLUCOSE 160* 145* 113* 106* 95  BUN 37* 37* 30* 25* 25*  CREATININE 1.37* 1.57* 1.53* 1.40* 1.50*  CALCIUM 8.2* 8.4 8.8 8.8 8.7   Liver Function Tests:  Recent Labs Lab 03/15/14 2019 03/16/14 0640  AST 13 12  ALT 10 9  ALKPHOS 122* 98  BILITOT 0.4 0.4  PROT 6.1 5.5*  ALBUMIN 2.3* 2.2*   No results found for this basename: LIPASE, AMYLASE,  in the last 168 hours No results found for this basename: AMMONIA,  in the last 168 hours CBC:  Recent Labs Lab 03/15/14 2019 03/16/14 0640 03/16/14 1835 03/17/14 0647 03/18/14 0655 03/19/14 0543 03/20/14 0645  WBC 13.8* 9.3 8.4 7.3 6.6 8.3 8.1  NEUTROABS 7.1 6.4  --   --   --   --   --   HGB 8.0* 6.4* 8.4* 8.0* 8.3* 8.2* 7.8*  HCT 25.0* 19.2* 24.5* 24.2* 25.0* 24.8* 23.8*  MCV 86.2 86.5 84.8 86.4 86.5 86.4 86.9  PLT 340 238 207 194 222 275 264   Cardiac Enzymes: No results found for this basename: CKTOTAL, CKMB, CKMBINDEX, TROPONINI,  in the last 168 hours BNP: BNP (last 3 results)  Recent Labs  01/16/14 2043  PROBNP 3148.0*   CBG:  Recent Labs Lab 03/19/14 0827 03/19/14 1233 03/19/14 1726 03/19/14 2141 03/20/14 0738  GLUCAP 88 267* 132* 168* 88       Signed:  Ronda Kazmi  [facility] 03/20/2014, 12:28 PM

## 2014-03-20 NOTE — Telephone Encounter (Signed)
RX faxed to [facility] @ [phone], phone number [phone] 

## 2014-03-20 NOTE — Progress Notes (Signed)
Clinical Social Worker facilitated patient discharge by contacting the patient and facility, [facility]. Patient agreeable to this plan and arranging transport via EMS . CSW will sign off, as social work intervention is no longer needed.  Jeanette Caprice, MSW, [place]

## 2014-03-21 ENCOUNTER — Encounter: Payer: Medicare Other | Admitting: Internal Medicine

## 2014-03-21 ENCOUNTER — Encounter: Payer: Self-pay | Admitting: Internal Medicine

## 2014-03-21 ENCOUNTER — Non-Acute Institutional Stay (SKILLED_NURSING_FACILITY): Payer: Medicare Other | Admitting: Internal Medicine

## 2014-03-21 DIAGNOSIS — K5900 Constipation, unspecified: Secondary | ICD-10-CM

## 2014-03-21 DIAGNOSIS — D62 Acute posthemorrhagic anemia: Secondary | ICD-10-CM

## 2014-03-21 DIAGNOSIS — I4891 Unspecified atrial fibrillation: Secondary | ICD-10-CM

## 2014-03-21 DIAGNOSIS — R5383 Other fatigue: Secondary | ICD-10-CM

## 2014-03-21 DIAGNOSIS — I10 Essential (primary) hypertension: Secondary | ICD-10-CM

## 2014-03-21 DIAGNOSIS — R531 Weakness: Secondary | ICD-10-CM

## 2014-03-21 DIAGNOSIS — R5381 Other malaise: Secondary | ICD-10-CM

## 2014-03-21 DIAGNOSIS — I872 Venous insufficiency (chronic) (peripheral): Secondary | ICD-10-CM

## 2014-03-21 DIAGNOSIS — M869 Osteomyelitis, unspecified: Secondary | ICD-10-CM

## 2014-03-21 DIAGNOSIS — K219 Gastro-esophageal reflux disease without esophagitis: Secondary | ICD-10-CM

## 2014-03-21 DIAGNOSIS — E1159 Type 2 diabetes mellitus with other circulatory complications: Secondary | ICD-10-CM

## 2014-03-21 NOTE — Progress Notes (Signed)
Patient ID: [name], male   DOB: 08-19-1935, 79 y.o.   MRN: [id number]     [name] [place]   PCP: Georgetta Haber, MD  Code Status: DNR  Allergies  Allergen Reactions  . Morphine Rash and Other (See Comments)    Irritability also    Chief Complaint: new admission  HPI:  79 y/o male patient is here for STR and has had couple of readmissions to the hospital. He was in the hospital from 03/15/14- 03/20/14 with wound dehiscence and bleeding. His eliquis has been on hold. Orthopedic revised his right transtibial amputation site, he was put on keflex and doxycycline and also received 2 u prbc transfusion in the hospital.  He is seen in his room. He complaints of his pain not being under control. He denies any other complaint. He wants his oxycodone dose and frequency increased. On review of his chart, he has hx of drug abuse and dependence. He also complains of muscle spasm  Review of Systems:  Constitutional: Negative for fever, chills, weight loss, diaphoresis.  HENT: Negative for congestion, hearing loss and sore throat.   Eyes: Negative for eye pain, blurred vision, double vision and discharge.  Respiratory: Negative for cough, sputum production, shortness of breath and wheezing.   Cardiovascular: Negative for chest pain, palpitations, orthopnea and leg swelling.  Gastrointestinal: Negative for heartburn, nausea, vomiting, abdominal pain, diarrhea and constipation.  Genitourinary: Negative for dysuria, urgency, frequency, hematuria and flank pain.  Musculoskeletal: pain at amputation site Skin: Negative for itching and rash.  Neurological: Negative for dizziness, tingling, focal weakness and headaches. has generalized weakness Psychiatric/Behavioral: Negative for depression and memory loss. The patient is not nervous/anxious.     Past Medical History  Diagnosis Date  . Fatigue   . Overweight   . Syncope and collapse   . GERD (gastroesophageal reflux disease)    . Right bundle branch block   . Unspecified venous (peripheral) insufficiency   . Phlebitis and thrombophlebitis of superficial vessels of lower extremities   . Cellulitis and abscess of leg, except foot   . Backache, unspecified   . Long term (current) use of anticoagulants   . Type II or unspecified type diabetes mellitus without mention of complication, not stated as uncontrolled   . Unspecified venous (peripheral) insufficiency   . Hypertension   . Gout, unspecified   . Heart failure   . Atrial fibrillation     permanent  . Esophageal stricture   . Hx of colonic polyps 05-2006    (Adenomatous)Dr. [name]  . Diverticulosis 05-2006    Dr. [name]   . Tachycardia-bradycardia     s/p PPM  . Pacemaker- [name] 02/20/2013  . CHF (congestive heart failure)    Past Surgical History  Procedure Laterality Date  . Laminectomy    . Lumbar fusion    . Rotator cuff repair    . Ruptured rt rectus muscle    . Eye surgery    . Pacemaker insertion  8/12    SJM by [name] for tachy/brady syndrome  . I&d extremity Right 01/17/2014    Procedure: IRRIGATION AND DEBRIDEMENT RIGHT HEEL  WITH CULTURES AND BONE BIOPSY, placement of wound vac;  Surgeon: Theodoro Kos, DO;  Location: [place];  Service: Plastics;  Laterality: Right;  . Amputation Right 01/20/2014    Procedure: AMPUTATION BELOW KNEE- right;  Surgeon: [name], MD;  Location: [place];  Service: Orthopedics;  Laterality: Right;  Right Below Knee Amputation  .  Amputation Right 02/14/2014    Procedure: AMPUTATION BELOW KNEE;  Surgeon: [name], MD;  Location: [place];  Service: Orthopedics;  Laterality: Right;  Right Below Knee Amputation Revision  . Amputation Right 03/07/2014    Procedure: AMPUTATION BELOW KNEE;  Surgeon: [name], MD;  Location: [place];  Service: Orthopedics;  Laterality: Right;  Right Below Knee Amputation Revision  . Amputation Right 03/18/2014    Procedure: revision of  AMPUTATION BELOW KNEE;  Surgeon: [name], MD;  Location: [place];  Service: Orthopedics;  Laterality: Right;  Revision Right Below Knee Amputation   Social History:   reports that he has never smoked. He has never used smokeless tobacco. He reports that he does not drink alcohol or use illicit drugs.  Family History  Problem Relation Age of Onset  . Pneumonia Mother   . Heart disease Father   . Diabetes Father   . Colon cancer Neg Hx     Medications: Patient's Medications  New Prescriptions   No medications on file  Previous Medications   CEPHALEXIN (KEFLEX) 500 MG CAPSULE    Take 1 capsule (500 mg total) by mouth every 6 (six) hours.   DIGOXIN (LANOXIN) 0.125 MG TABLET    Take 0.125 mg by mouth daily.   DILTIAZEM (CARDIZEM CD) 180 MG 24 HR CAPSULE    Take 1 capsule (180 mg total) by mouth daily.   DOXYCYCLINE (VIBRA-TABS) 100 MG TABLET    Take 1 tablet (100 mg total) by mouth 2 (two) times daily.   FERROUS FUMARATE (HEMOCYTE - 106 MG FE) 325 (106 FE) MG TABS TABLET    Take 1 tablet (106 mg of iron total) by mouth daily at 12 noon.   FUROSEMIDE (LASIX) 40 MG TABLET    Take 40 mg by mouth daily.    INSULIN ASPART (NOVOLOG FLEXPEN) 100 UNIT/ML FLEXPEN    Inject 0-10 Units into the skin 3 (three) times daily with meals. Per sliding scale:  cbg 150-250 5 units, 251-300 8 units, 301-500 10 units (call MD if over 350)   INSULIN GLARGINE (LANTUS SOLOSTAR) 100 UNIT/ML SOLOSTAR PEN    Inject 20 Units into the skin at bedtime.   LISINOPRIL (PRINIVIL,ZESTRIL) 10 MG TABLET    Take 10 mg by mouth daily.   LORAZEPAM (ATIVAN) 0.5 MG TABLET    Take 0.5 tablets (0.25 mg total) by mouth 2 (two) times daily as needed for anxiety.   MELATONIN 5 MG TABS    Take 5 mg by mouth at bedtime as needed (insomnia).   MULTIPLE VITAMINS-MINERALS (DECUBI-VITE) CAPS    Take 1 capsule by mouth daily.    NUTRITIONAL SUPPLEMENTS (FEEDING SUPPLEMENT, GLUCERNA 1.2 CAL,) LIQD    Take by mouth 2 (two) times daily.   OMEPRAZOLE (PRILOSEC) 20 MG CAPSULE    Take 20  mg by mouth 2 (two) times daily before a meal.   OXYCODONE HCL 10 MG TABS    Take 1 tablet (10 mg total) by mouth every 6 (six) hours. Scheduled (hold for sedation)   SENNA-DOCUSATE (SENNA-S) 8.6-50 MG PER TABLET    Take 1 tablet by mouth daily.   SILVER SULFADIAZINE (SILVADENE) 1 % CREAM    Apply 1 application topically daily.  Modified Medications   No medications on file  Discontinued Medications   No medications on file     Physical Exam: Filed Vitals:   03/21/14 1231  BP: 130/68  Pulse: 72  Temp: 97.7 F (36.5 C)  Resp: 18  Weight: 157 lb (71.215 kg)  SpO2: 95%    General- elderly male in no acute distress Head- atraumatic, normocephalic Eyes- PERRLA, EOMI, no pallor, no icterus, no discharge Neck- no lymphadenopathy, no thyromegaly, no jugular vein distension Cardiovascular- normal s1,s2, no murmurs/ rubs/ gallops Respiratory- bilateral clear to auscultation, no wheeze, no rhonchi, no crackles, no use of accessory muscles Abdomen- bowel sounds present, soft, non tender Musculoskeletal- able to move all 4 extremities, right BKA, no spinal and paraspinal tenderness, on wheelchair, no edema, dressing dry and clean. staples in place Neurological- no focal deficit Skin- warm and dry Psychiatry- alert and oriented to person, place and time, normal mood and affect   Labs reviewed: Basic Metabolic Panel:  Recent Labs  03/18/14 0655 03/19/14 0543 03/20/14 0645  NA 136* 141 137  K 4.3 4.1 4.1  CL 99 103 98  CO2 25 26 26   GLUCOSE 113* 106* 95  BUN 30* 25* 25*  CREATININE 1.53* 1.40* 1.50*  CALCIUM 8.8 8.8 8.7   Liver Function Tests:  Recent Labs  03/02/14 0710 03/15/14 2019 03/16/14 0640  AST 12 13 12   ALT 7 10 9   ALKPHOS 95 122* 98  BILITOT 0.9 0.4 0.4  PROT 6.4 6.1 5.5*  ALBUMIN 2.6* 2.3* 2.2*   No results found for this basename: LIPASE, AMYLASE,  in the last 8760 hours No results found for this basename: AMMONIA,  in the last 8760  hours CBC:  Recent Labs  03/08/14 2145  03/15/14 2019 03/16/14 0640  03/18/14 0655 03/19/14 0543 03/20/14 0645  WBC 11.9*  < > 13.8* 9.3  < > 6.6 8.3 8.1  NEUTROABS 7.9*  --  7.1 6.4  --   --   --   --   HGB 6.3*  < > 8.0* 6.4*  < > 8.3* 8.2* 7.8*  HCT 19.7*  < > 25.0* 19.2*  < > 25.0* 24.8* 23.8*  MCV 81.7  < > 86.2 86.5  < > 86.5 86.4 86.9  PLT 236  < > 340 238  < > 222 275 264  < > = values in this interval not displayed. Cardiac Enzymes: No results found for this basename: CKTOTAL, CKMB, CKMBINDEX, TROPONINI,  in the last 8760 hours BNP: No components found with this basename: POCBNP,  CBG:  Recent Labs  03/19/14 2141 03/20/14 0738 03/20/14 1250  GLUCAP 168* 88 186*    Assessment/Plan  Generalized weakness- from recurrent anemia, osteomyelitis and recent BKA. Will need to work with PT and OT for gait training and strengthening exercises. Fall precautions  Osteomyelitis- continue keflex and doxycycline until 04/07/14. Monitor wbc and temp  PVD- s/pRight BKA and amputation site healing well, monitor for blood loss. To work with PT and OT. Fall precautions. With pain not under control, discontinue oxycodone and have him on oxycodone-apap 10-325 mg po q6h with gabapentin 100 mg q12h for possible phantom pain.Continue wound care  Anemia- from blood loss from amputation site. Monitor h/h. Continue ferrous sulfate  Anxiety- continue ativan 0.5 mg bid prn  afib- has a pacemaker.rate controlled with diltiazem 180mg  daily and continue digoxin. eliquis on hold for now until 03/27/14  constipation- continue senna-s 1 tab daily with miralax 17 g po daily prn for constipation  Dm type 2- continue lantus and SSI novolog, monitor cbg. Continue ACEI  Hypertension- continue lisinopril 10 mg daily  GERD- continue prilosec 10 mg daily   Family/ staff Communication: reviewed care plan with patient and nursing supervisor  Goals of care: short term rehabilitation  Labs/tests  ordered: cbc    Blanchie Serve, MD  [facility] [phone] (Monday-Friday 8 am - 5 pm) [phone] (afterhours)

## 2014-03-22 LAB — CULTURE, BLOOD (ROUTINE X 2)
Culture: NO GROWTH
Culture: NO GROWTH

## 2014-03-23 DIAGNOSIS — R531 Weakness: Secondary | ICD-10-CM | POA: Insufficient documentation

## 2014-03-24 ENCOUNTER — Other Ambulatory Visit: Payer: Self-pay | Admitting: *Deleted

## 2014-03-24 MED ORDER — GABAPENTIN 100 MG PO CAPS: 100.0000 mg | ORAL_CAPSULE | Freq: Two times a day (BID) | ORAL | Status: DC

## 2014-03-24 MED ORDER — OXYCODONE-ACETAMINOPHEN 10-325 MG PO TABS: 1.0000 | ORAL_TABLET | Freq: Four times a day (QID) | ORAL | Status: DC | PRN

## 2014-03-24 NOTE — Telephone Encounter (Signed)
Refill changed per patient chart (03/21/2014) to [facility]

## 2014-04-01 ENCOUNTER — Other Ambulatory Visit (HOSPITAL_COMMUNITY): Payer: Self-pay | Admitting: Orthopedic Surgery

## 2014-04-01 ENCOUNTER — Encounter (HOSPITAL_COMMUNITY): Payer: Self-pay | Admitting: *Deleted

## 2014-04-01 MED ORDER — CEFAZOLIN SODIUM-DEXTROSE 2-3 GM-% IV SOLR
2.0000 g | INTRAVENOUS | Status: AC
  Administered 2014-04-02: 2 g via INTRAVENOUS
  Filled 2014-04-01: qty 50

## 2014-04-01 NOTE — Progress Notes (Signed)
[name] is in [facility] for rehab.  I spoke to patient's nurse Herbie Baltimore and gave pre op instructions.

## 2014-04-02 ENCOUNTER — Inpatient Hospital Stay (HOSPITAL_COMMUNITY): Payer: Medicare Other | Admitting: Anesthesiology

## 2014-04-02 ENCOUNTER — Inpatient Hospital Stay (HOSPITAL_COMMUNITY)
Admission: RE | Admit: 2014-04-02 | Discharge: 2014-04-05 | DRG: 474 | Disposition: A | Discharge status: Skilled Nursing Facility | Payer: Medicare Other | Source: Ambulatory Visit | Attending: Orthopedic Surgery | Admitting: Orthopedic Surgery

## 2014-04-02 ENCOUNTER — Encounter (HOSPITAL_COMMUNITY)
Admission: RE | Disposition: A | Discharge status: Skilled Nursing Facility | Payer: Self-pay | Source: Ambulatory Visit | Attending: Orthopedic Surgery

## 2014-04-02 ENCOUNTER — Encounter (HOSPITAL_COMMUNITY): Payer: Medicare Other | Admitting: Anesthesiology

## 2014-04-02 ENCOUNTER — Encounter (HOSPITAL_COMMUNITY): Payer: Self-pay | Admitting: *Deleted

## 2014-04-02 DIAGNOSIS — K219 Gastro-esophageal reflux disease without esophagitis: Secondary | ICD-10-CM | POA: Diagnosis present

## 2014-04-02 DIAGNOSIS — M109 Gout, unspecified: Secondary | ICD-10-CM | POA: Diagnosis present

## 2014-04-02 DIAGNOSIS — T8789 Other complications of amputation stump: Principal | ICD-10-CM | POA: Diagnosis present

## 2014-04-02 DIAGNOSIS — L97509 Non-pressure chronic ulcer of other part of unspecified foot with unspecified severity: Secondary | ICD-10-CM | POA: Diagnosis present

## 2014-04-02 DIAGNOSIS — I4891 Unspecified atrial fibrillation: Secondary | ICD-10-CM | POA: Diagnosis present

## 2014-04-02 DIAGNOSIS — Z950 Presence of cardiac pacemaker: Secondary | ICD-10-CM

## 2014-04-02 DIAGNOSIS — Z794 Long term (current) use of insulin: Secondary | ICD-10-CM

## 2014-04-02 DIAGNOSIS — I872 Venous insufficiency (chronic) (peripheral): Secondary | ICD-10-CM | POA: Diagnosis present

## 2014-04-02 DIAGNOSIS — Z7901 Long term (current) use of anticoagulants: Secondary | ICD-10-CM

## 2014-04-02 DIAGNOSIS — E1169 Type 2 diabetes mellitus with other specified complication: Secondary | ICD-10-CM | POA: Diagnosis present

## 2014-04-02 DIAGNOSIS — Z981 Arthrodesis status: Secondary | ICD-10-CM

## 2014-04-02 DIAGNOSIS — E43 Unspecified severe protein-calorie malnutrition: Secondary | ICD-10-CM | POA: Diagnosis present

## 2014-04-02 DIAGNOSIS — E1152 Type 2 diabetes mellitus with diabetic peripheral angiopathy with gangrene: Secondary | ICD-10-CM | POA: Diagnosis present

## 2014-04-02 DIAGNOSIS — M869 Osteomyelitis, unspecified: Secondary | ICD-10-CM | POA: Diagnosis present

## 2014-04-02 DIAGNOSIS — Z79899 Other long term (current) drug therapy: Secondary | ICD-10-CM

## 2014-04-02 DIAGNOSIS — I96 Gangrene, not elsewhere classified: Secondary | ICD-10-CM | POA: Diagnosis present

## 2014-04-02 DIAGNOSIS — M9080 Osteopathy in diseases classified elsewhere, unspecified site: Secondary | ICD-10-CM | POA: Diagnosis present

## 2014-04-02 DIAGNOSIS — I509 Heart failure, unspecified: Secondary | ICD-10-CM | POA: Diagnosis present

## 2014-04-02 DIAGNOSIS — IMO0002 Reserved for concepts with insufficient information to code with codable children: Secondary | ICD-10-CM

## 2014-04-02 DIAGNOSIS — Y835 Amputation of limb(s) as the cause of abnormal reaction of the patient, or of later complication, without mention of misadventure at the time of the procedure: Secondary | ICD-10-CM | POA: Diagnosis present

## 2014-04-02 DIAGNOSIS — D638 Anemia in other chronic diseases classified elsewhere: Secondary | ICD-10-CM | POA: Diagnosis present

## 2014-04-02 DIAGNOSIS — I10 Essential (primary) hypertension: Secondary | ICD-10-CM | POA: Diagnosis present

## 2014-04-02 DIAGNOSIS — E1159 Type 2 diabetes mellitus with other circulatory complications: Secondary | ICD-10-CM | POA: Diagnosis present

## 2014-04-02 HISTORY — PX: AMPUTATION: SHX166

## 2014-04-02 HISTORY — PX: ABOVE KNEE LEG AMPUTATION: SUR20

## 2014-04-02 LAB — CBC
HCT: 26.1 % — ABNORMAL LOW (ref 39.0–52.0)
Hemoglobin: 8.3 g/dL — ABNORMAL LOW (ref 13.0–17.0)
MCH: 27.6 pg (ref 26.0–34.0)
MCHC: 31.8 g/dL (ref 30.0–36.0)
MCV: 86.7 fL (ref 78.0–100.0)
Platelets: 251 10*3/uL (ref 150–400)
RBC: 3.01 MIL/uL — ABNORMAL LOW (ref 4.22–5.81)
RDW: 15.4 % (ref 11.5–15.5)
WBC: 8.2 10*3/uL (ref 4.0–10.5)

## 2014-04-02 LAB — BASIC METABOLIC PANEL
BUN: 34 mg/dL — ABNORMAL HIGH (ref 6–23)
CALCIUM: 9.2 mg/dL (ref 8.4–10.5)
CO2: 26 meq/L (ref 19–32)
CREATININE: 1.44 mg/dL — AB (ref 0.50–1.35)
Chloride: 100 mEq/L (ref 96–112)
GFR calc Af Amer: 53 mL/min — ABNORMAL LOW (ref >90)
GFR calc non Af Amer: 46 mL/min — ABNORMAL LOW (ref >90)
GLUCOSE: 150 mg/dL — AB (ref 70–99)
Potassium: 4.9 mEq/L (ref 3.7–5.3)
Sodium: 138 mEq/L (ref 137–147)

## 2014-04-02 LAB — PREPARE RBC (CROSSMATCH)

## 2014-04-02 LAB — GLUCOSE, CAPILLARY
GLUCOSE-CAPILLARY: 118 mg/dL — AB (ref 70–99)
GLUCOSE-CAPILLARY: 143 mg/dL — AB (ref 70–99)
GLUCOSE-CAPILLARY: 138 mg/dL — AB (ref 70–99)
Glucose-Capillary: 126 mg/dL — ABNORMAL HIGH (ref 70–99)

## 2014-04-02 SURGERY — AMPUTATION, ABOVE KNEE
Anesthesia: General | Site: Leg Lower | Laterality: Right

## 2014-04-02 MED ORDER — INSULIN GLARGINE 100 UNIT/ML ~~LOC~~ SOLN
20.0000 [IU] | Freq: Every day | SUBCUTANEOUS | Status: DC
  Administered 2014-04-02 – 2014-04-04 (×3): 20 [IU] via SUBCUTANEOUS
  Filled 2014-04-02 (×4): qty 0.2

## 2014-04-02 MED ORDER — FENTANYL CITRATE 0.05 MG/ML IJ SOLN
INTRAMUSCULAR | Status: DC | PRN
  Administered 2014-04-02 (×2): 25 ug via INTRAVENOUS
  Administered 2014-04-02 (×3): 50 ug via INTRAVENOUS

## 2014-04-02 MED ORDER — DIGOXIN 125 MCG PO TABS
0.1250 mg | ORAL_TABLET | Freq: Every day | ORAL | Status: DC
  Administered 2014-04-02 – 2014-04-05 (×4): 0.125 mg via ORAL
  Filled 2014-04-02 (×4): qty 1

## 2014-04-02 MED ORDER — INSULIN ASPART 100 UNIT/ML ~~LOC~~ SOLN
0.0000 [IU] | Freq: Three times a day (TID) | SUBCUTANEOUS | Status: DC
  Administered 2014-04-02: 1 [IU] via SUBCUTANEOUS
  Administered 2014-04-03: 2 [IU] via SUBCUTANEOUS
  Administered 2014-04-03: 1 [IU] via SUBCUTANEOUS
  Administered 2014-04-03: 3 [IU] via SUBCUTANEOUS
  Administered 2014-04-04 – 2014-04-05 (×4): 2 [IU] via SUBCUTANEOUS

## 2014-04-02 MED ORDER — METOCLOPRAMIDE HCL 5 MG/ML IJ SOLN: 5.0000 mg | Freq: Three times a day (TID) | INTRAMUSCULAR | Status: DC | PRN

## 2014-04-02 MED ORDER — FENTANYL CITRATE 0.05 MG/ML IJ SOLN
INTRAMUSCULAR | Status: AC
  Filled 2014-04-02: qty 5

## 2014-04-02 MED ORDER — GABAPENTIN 100 MG PO CAPS
100.0000 mg | ORAL_CAPSULE | Freq: Two times a day (BID) | ORAL | Status: DC
  Administered 2014-04-02 – 2014-04-05 (×7): 100 mg via ORAL
  Filled 2014-04-02 (×8): qty 1

## 2014-04-02 MED ORDER — CEFAZOLIN SODIUM 1-5 GM-% IV SOLN
1.0000 g | Freq: Four times a day (QID) | INTRAVENOUS | Status: AC
  Administered 2014-04-02 – 2014-04-03 (×3): 1 g via INTRAVENOUS
  Filled 2014-04-02 (×3): qty 50

## 2014-04-02 MED ORDER — LIDOCAINE HCL (CARDIAC) 20 MG/ML IV SOLN
INTRAVENOUS | Status: AC
  Filled 2014-04-02: qty 5

## 2014-04-02 MED ORDER — FERROUS FUMARATE 325 (106 FE) MG PO TABS
1.0000 | ORAL_TABLET | Freq: Every day | ORAL | Status: DC
  Administered 2014-04-03 – 2014-04-05 (×3): 106 mg via ORAL
  Filled 2014-04-02 (×3): qty 1

## 2014-04-02 MED ORDER — OXYCODONE-ACETAMINOPHEN 5-325 MG PO TABS
1.0000 | ORAL_TABLET | ORAL | Status: DC | PRN
  Administered 2014-04-03 – 2014-04-05 (×10): 2 via ORAL
  Filled 2014-04-02 (×10): qty 2

## 2014-04-02 MED ORDER — NEOSTIGMINE METHYLSULFATE 10 MG/10ML IV SOLN
INTRAVENOUS | Status: AC
  Filled 2014-04-02: qty 1

## 2014-04-02 MED ORDER — ONDANSETRON HCL 4 MG PO TABS
4.0000 mg | ORAL_TABLET | Freq: Four times a day (QID) | ORAL | Status: DC | PRN
Start: 2014-04-02 — End: 2014-04-05

## 2014-04-02 MED ORDER — MEPERIDINE HCL 25 MG/ML IJ SOLN: 6.2500 mg | INTRAMUSCULAR | Status: DC | PRN

## 2014-04-02 MED ORDER — SENNOSIDES-DOCUSATE SODIUM 8.6-50 MG PO TABS
1.0000 | ORAL_TABLET | Freq: Every day | ORAL | Status: DC
  Administered 2014-04-02 – 2014-04-05 (×2): 1 via ORAL
  Filled 2014-04-02 (×3): qty 1

## 2014-04-02 MED ORDER — ONDANSETRON HCL 4 MG/2ML IJ SOLN
INTRAMUSCULAR | Status: AC
  Filled 2014-04-02: qty 2

## 2014-04-02 MED ORDER — DEXMEDETOMIDINE HCL IN NACL 200 MCG/50ML IV SOLN
0.4000 ug/kg/h | INTRAVENOUS | Status: DC
  Filled 2014-04-02: qty 50

## 2014-04-02 MED ORDER — ROCURONIUM BROMIDE 50 MG/5ML IV SOLN
INTRAVENOUS | Status: AC
  Filled 2014-04-02: qty 1

## 2014-04-02 MED ORDER — GLYCOPYRROLATE 0.2 MG/ML IJ SOLN
INTRAMUSCULAR | Status: AC
  Filled 2014-04-02: qty 3

## 2014-04-02 MED ORDER — FENTANYL CITRATE 0.05 MG/ML IJ SOLN
INTRAMUSCULAR | Status: AC
  Filled 2014-04-02: qty 2

## 2014-04-02 MED ORDER — METHOCARBAMOL 500 MG PO TABS
500.0000 mg | ORAL_TABLET | Freq: Four times a day (QID) | ORAL | Status: DC | PRN
  Administered 2014-04-02 – 2014-04-05 (×7): 500 mg via ORAL
  Filled 2014-04-02 (×9): qty 1

## 2014-04-02 MED ORDER — FUROSEMIDE 40 MG PO TABS
40.0000 mg | ORAL_TABLET | Freq: Every day | ORAL | Status: DC
  Administered 2014-04-02: 40 mg via ORAL
  Filled 2014-04-02 (×2): qty 1

## 2014-04-02 MED ORDER — HYDROMORPHONE HCL PF 1 MG/ML IJ SOLN
0.5000 mg | INTRAMUSCULAR | Status: DC | PRN
  Administered 2014-04-02 – 2014-04-03 (×5): 1 mg via INTRAVENOUS
  Filled 2014-04-02 (×5): qty 1

## 2014-04-02 MED ORDER — LISINOPRIL 10 MG PO TABS
10.0000 mg | ORAL_TABLET | Freq: Every day | ORAL | Status: DC
  Administered 2014-04-02 – 2014-04-05 (×4): 10 mg via ORAL
  Filled 2014-04-02 (×4): qty 1

## 2014-04-02 MED ORDER — MIDAZOLAM HCL 2 MG/2ML IJ SOLN
INTRAMUSCULAR | Status: AC
  Filled 2014-04-02: qty 2

## 2014-04-02 MED ORDER — LACTATED RINGERS IV SOLN
INTRAVENOUS | Status: DC
  Administered 2014-04-02: 09:00:00 via INTRAVENOUS

## 2014-04-02 MED ORDER — APIXABAN 5 MG PO TABS
5.0000 mg | ORAL_TABLET | Freq: Every day | ORAL | Status: DC
  Filled 2014-04-02 (×2): qty 1

## 2014-04-02 MED ORDER — METOCLOPRAMIDE HCL 10 MG PO TABS: 5.0000 mg | ORAL_TABLET | Freq: Three times a day (TID) | ORAL | Status: DC | PRN

## 2014-04-02 MED ORDER — ZOLPIDEM TARTRATE 5 MG PO TABS
5.0000 mg | ORAL_TABLET | Freq: Every evening | ORAL | Status: DC | PRN
  Administered 2014-04-02 – 2014-04-04 (×3): 5 mg via ORAL
  Filled 2014-04-02 (×3): qty 1

## 2014-04-02 MED ORDER — METHOCARBAMOL 500 MG PO TABS
500.0000 mg | ORAL_TABLET | Freq: Four times a day (QID) | ORAL | Status: DC | PRN
  Administered 2014-04-02: 500 mg via ORAL

## 2014-04-02 MED ORDER — PANTOPRAZOLE SODIUM 40 MG PO TBEC
40.0000 mg | DELAYED_RELEASE_TABLET | Freq: Every day | ORAL | Status: DC
  Administered 2014-04-02 – 2014-04-05 (×4): 40 mg via ORAL
  Filled 2014-04-02 (×3): qty 1

## 2014-04-02 MED ORDER — DILTIAZEM HCL ER COATED BEADS 180 MG PO CP24
180.0000 mg | ORAL_CAPSULE | Freq: Every day | ORAL | Status: DC
  Administered 2014-04-02 – 2014-04-05 (×4): 180 mg via ORAL
  Filled 2014-04-02 (×4): qty 1

## 2014-04-02 MED ORDER — METHOCARBAMOL 500 MG PO TABS
ORAL_TABLET | ORAL | Status: AC
  Filled 2014-04-02: qty 1

## 2014-04-02 MED ORDER — LACTATED RINGERS IV SOLN
INTRAVENOUS | Status: DC | PRN
  Administered 2014-04-02: 11:00:00 via INTRAVENOUS

## 2014-04-02 MED ORDER — SODIUM CHLORIDE 0.9 % IV SOLN
INTRAVENOUS | Status: DC
  Administered 2014-04-02: 22:00:00 via INTRAVENOUS

## 2014-04-02 MED ORDER — PROMETHAZINE HCL 25 MG/ML IJ SOLN: 6.2500 mg | INTRAMUSCULAR | Status: DC | PRN

## 2014-04-02 MED ORDER — GLUCERNA 1.2 CAL PO LIQD
237.0000 mL | Freq: Two times a day (BID) | ORAL | Status: DC
  Administered 2014-04-02 – 2014-04-04 (×6): 237 mL via ORAL
  Filled 2014-04-02 (×9): qty 237

## 2014-04-02 MED ORDER — INSULIN ASPART 100 UNIT/ML ~~LOC~~ SOLN
3.0000 [IU] | Freq: Three times a day (TID) | SUBCUTANEOUS | Status: DC
  Administered 2014-04-02 – 2014-04-05 (×8): 3 [IU] via SUBCUTANEOUS

## 2014-04-02 MED ORDER — PROPOFOL 10 MG/ML IV BOLUS
INTRAVENOUS | Status: AC
  Filled 2014-04-02: qty 20

## 2014-04-02 MED ORDER — ONDANSETRON HCL 4 MG/2ML IJ SOLN
INTRAMUSCULAR | Status: DC | PRN
  Administered 2014-04-02: 4 mg via INTRAVENOUS

## 2014-04-02 MED ORDER — ONDANSETRON HCL 4 MG/2ML IJ SOLN: 4.0000 mg | Freq: Four times a day (QID) | INTRAMUSCULAR | Status: DC | PRN

## 2014-04-02 MED ORDER — LIDOCAINE HCL (CARDIAC) 20 MG/ML IV SOLN
INTRAVENOUS | Status: DC | PRN
  Administered 2014-04-02: 40 mg via INTRAVENOUS

## 2014-04-02 MED ORDER — ASPIRIN EC 325 MG PO TBEC
325.0000 mg | DELAYED_RELEASE_TABLET | Freq: Every day | ORAL | Status: DC
  Administered 2014-04-03 – 2014-04-05 (×3): 325 mg via ORAL
  Filled 2014-04-02 (×5): qty 1

## 2014-04-02 MED ORDER — PROPOFOL 10 MG/ML IV BOLUS
INTRAVENOUS | Status: DC | PRN
  Administered 2014-04-02: 150 mg via INTRAVENOUS

## 2014-04-02 MED ORDER — FENTANYL CITRATE 0.05 MG/ML IJ SOLN
25.0000 ug | INTRAMUSCULAR | Status: DC | PRN
  Administered 2014-04-02 (×2): 25 ug via INTRAVENOUS

## 2014-04-02 MED ORDER — ARTIFICIAL TEARS OP OINT
TOPICAL_OINTMENT | OPHTHALMIC | Status: DC | PRN
  Administered 2014-04-02: 1 via OPHTHALMIC

## 2014-04-02 MED ORDER — MIDAZOLAM HCL 2 MG/2ML IJ SOLN: 0.5000 mg | Freq: Once | INTRAMUSCULAR | Status: DC | PRN

## 2014-04-02 MED ORDER — LORAZEPAM 0.5 MG PO TABS: 0.2500 mg | ORAL_TABLET | Freq: Two times a day (BID) | ORAL | Status: DC | PRN

## 2014-04-02 SURGICAL SUPPLY — 41 items
BLADE SAW RECIP 87.9 MT (BLADE) ×3 IMPLANT
BNDG COHESIVE 4X5 WHT NS (GAUZE/BANDAGES/DRESSINGS) ×6 IMPLANT
BNDG COHESIVE 6X5 TAN STRL LF (GAUZE/BANDAGES/DRESSINGS) ×3 IMPLANT
COVER SURGICAL LIGHT HANDLE (MISCELLANEOUS) ×3 IMPLANT
CUFF TOURNIQUET SINGLE 34IN LL (TOURNIQUET CUFF) IMPLANT
CUFF TOURNIQUET SINGLE 44IN (TOURNIQUET CUFF) IMPLANT
DRAIN PENROSE 1/2X12 LTX STRL (WOUND CARE) IMPLANT
DRAPE EXTREMITY T 121X128X90 (DRAPE) ×3 IMPLANT
DRAPE PROXIMA HALF (DRAPES) ×3 IMPLANT
DRAPE U-SHAPE 47X51 STRL (DRAPES) ×6 IMPLANT
DRSG ADAPTIC 3X8 NADH LF (GAUZE/BANDAGES/DRESSINGS) ×3 IMPLANT
DURAPREP 26ML APPLICATOR (WOUND CARE) ×3 IMPLANT
ELECT CAUTERY BLADE 6.4 (BLADE) IMPLANT
ELECT REM PT RETURN 9FT ADLT (ELECTROSURGICAL) ×3
ELECTRODE REM PT RTRN 9FT ADLT (ELECTROSURGICAL) ×1 IMPLANT
EVACUATOR 1/8 PVC DRAIN (DRAIN) IMPLANT
GLOVE BIOGEL PI IND STRL 9 (GLOVE) ×1 IMPLANT
GLOVE BIOGEL PI INDICATOR 9 (GLOVE) ×2
GLOVE SURG ORTHO 9.0 STRL STRW (GLOVE) ×6 IMPLANT
GOWN STRL REUS W/ TWL XL LVL3 (GOWN DISPOSABLE) ×2 IMPLANT
GOWN STRL REUS W/TWL XL LVL3 (GOWN DISPOSABLE) ×4
KIT BASIN OR (CUSTOM PROCEDURE TRAY) ×3 IMPLANT
KIT ROOM TURNOVER OR (KITS) ×3 IMPLANT
MANIFOLD NEPTUNE II (INSTRUMENTS) ×3 IMPLANT
NS IRRIG 1000ML POUR BTL (IV SOLUTION) ×3 IMPLANT
PACK GENERAL/GYN (CUSTOM PROCEDURE TRAY) ×3 IMPLANT
PAD ABD 8X10 STRL (GAUZE/BANDAGES/DRESSINGS) ×9 IMPLANT
PAD ARMBOARD 7.5X6 YLW CONV (MISCELLANEOUS) ×3 IMPLANT
SPONGE GAUZE 4X4 12PLY (GAUZE/BANDAGES/DRESSINGS) ×3 IMPLANT
SPONGE GAUZE 4X4 12PLY STER LF (GAUZE/BANDAGES/DRESSINGS) ×3 IMPLANT
STAPLER VISISTAT 35W (STAPLE) ×3 IMPLANT
STOCKINETTE IMPERVIOUS LG (DRAPES) ×3 IMPLANT
SUT PDS AB 1 CT  36 (SUTURE) ×4
SUT PDS AB 1 CT 36 (SUTURE) ×2 IMPLANT
SUT SILK 2 0 (SUTURE) ×2
SUT SILK 2-0 18XBRD TIE 12 (SUTURE) ×1 IMPLANT
SWAB COLLECTION DEVICE MRSA (MISCELLANEOUS) IMPLANT
TOWEL OR 17X24 6PK STRL BLUE (TOWEL DISPOSABLE) ×3 IMPLANT
TOWEL OR 17X26 10 PK STRL BLUE (TOWEL DISPOSABLE) ×3 IMPLANT
TUBE ANAEROBIC SPECIMEN COL (MISCELLANEOUS) IMPLANT
WATER STERILE IRR 1000ML POUR (IV SOLUTION) ×3 IMPLANT

## 2014-04-02 NOTE — Anesthesia Preprocedure Evaluation (Addendum)
Anesthesia Evaluation  Patient identified by MRN, date of birth, ID band Patient awake    Reviewed: Allergy & Precautions, H&P , Patient's Chart, lab work & pertinent test results, reviewed documented beta blocker date and time   History of Anesthesia Complications Negative for: history of anesthetic complications  Airway Mallampati: II TM Distance: >3 FB Neck ROM: full    Dental   Pulmonary  breath sounds clear to auscultation        Cardiovascular Exercise Tolerance: Good hypertension, + Peripheral Vascular Disease and +CHF + dysrhythmias Atrial Fibrillation + pacemaker Rhythm:Irregular Rate:Normal     Neuro/Psych TIAnegative psych ROS   GI/Hepatic GERD-  Controlled,  Endo/Other  diabetes  Renal/GU      Musculoskeletal   Abdominal   Peds  Hematology  (+) Blood dyscrasia, anemia ,   Anesthesia Other Findings   Reproductive/Obstetrics                        Anesthesia Physical Anesthesia Plan  ASA: III  Anesthesia Plan: General LMA   Post-op Pain Management:    Induction: Intravenous  Airway Management Planned: LMA  Additional Equipment:   Intra-op Plan:   Post-operative Plan:   Informed Consent: I have reviewed the patients History and Physical, chart, labs and discussed the procedure including the risks, benefits and alternatives for the proposed anesthesia with the patient or authorized representative who has indicated his/her understanding and acceptance.   Dental Advisory Given and Dental advisory given  Plan Discussed with: CRNA, Surgeon and Anesthesiologist  Anesthesia Plan Comments: (Afib permanent, with Pacemaker Type 2 DM glucose 123 Hyertension PVD  Plan GA with LMA  [name])       Anesthesia Quick Evaluation

## 2014-04-02 NOTE — Progress Notes (Signed)
Lunch relief by L. Arlyss Queen RN

## 2014-04-02 NOTE — Transfer of Care (Signed)
Immediate Anesthesia Transfer of Care Note  Patient: [name]  Procedure(s) Performed: Procedure(s) with comments: AMPUTATION ABOVE KNEE (Right) - Right Above Knee Amputation  Patient Location: PACU  Anesthesia Type:General  Level of Consciousness: awake and alert   Airway & Oxygen Therapy: Patient Spontanous Breathing and Patient connected to nasal cannula oxygen  Post-op Assessment: Report given to PACU RN and Post -op Vital signs reviewed and stable  Post vital signs: Reviewed and stable  Complications: No apparent anesthesia complications.

## 2014-04-02 NOTE — H&P (Signed)
[name] is an 79 y.o. male.   Chief Complaint: Dehiscence right transtibial amputation revision HPI: Patient is a 78 year old gentleman with severe peripheral vascular disease. Patient has undergone multiple attempts at limb salvage to maintain a transtibial amputation the right per the patient's request. Patient presents at this time with progressive gangrenous changes to the transtibial amputation does not have any further limb salvage options and presents for an above-the-knee amputation.  Past Medical History  Diagnosis Date  . Fatigue   . Overweight   . Syncope and collapse   . GERD (gastroesophageal reflux disease)   . Right bundle branch block   . Unspecified venous (peripheral) insufficiency   . Phlebitis and thrombophlebitis of superficial vessels of lower extremities   . Cellulitis and abscess of leg, except foot   . Backache, unspecified   . Long term (current) use of anticoagulants   . Type II or unspecified type diabetes mellitus without mention of complication, not stated as uncontrolled   . Unspecified venous (peripheral) insufficiency   . Hypertension   . Gout, unspecified   . Heart failure   . Atrial fibrillation     permanent  . Esophageal stricture   . Hx of colonic polyps 05-2006    (Adenomatous)Dr. [name]  . Diverticulosis 05-2006    Dr. [name]   . Tachycardia-bradycardia     s/p PPM  . Pacemaker- [name] 02/20/2013  . CHF (congestive heart failure)     Past Surgical History  Procedure Laterality Date  . Laminectomy    . Lumbar fusion    . Rotator cuff repair    . Ruptured rt rectus muscle    . Eye surgery    . Pacemaker insertion  8/12    SJM by [name] for tachy/brady syndrome  . I&d extremity Right 01/17/2014    Procedure: IRRIGATION AND DEBRIDEMENT RIGHT HEEL  WITH CULTURES AND BONE BIOPSY, placement of wound vac;  Surgeon: Theodoro Kos, DO;  Location: [place];  Service: Plastics;  Laterality: Right;  . Amputation Right 01/20/2014   Procedure: AMPUTATION BELOW KNEE- right;  Surgeon: [name], MD;  Location: [place];  Service: Orthopedics;  Laterality: Right;  Right Below Knee Amputation  . Amputation Right 02/14/2014    Procedure: AMPUTATION BELOW KNEE;  Surgeon: [name], MD;  Location: [place];  Service: Orthopedics;  Laterality: Right;  Right Below Knee Amputation Revision  . Amputation Right 03/07/2014    Procedure: AMPUTATION BELOW KNEE;  Surgeon: [name], MD;  Location: [place];  Service: Orthopedics;  Laterality: Right;  Right Below Knee Amputation Revision  . Amputation Right 03/18/2014    Procedure: revision of  AMPUTATION BELOW KNEE;  Surgeon: [name], MD;  Location: [place];  Service: Orthopedics;  Laterality: Right;  Revision Right Below Knee Amputation    Family History  Problem Relation Age of Onset  . Pneumonia Mother   . Heart disease Father   . Diabetes Father   . Colon cancer Neg Hx    Social History:  reports that he has never smoked. He has never used smokeless tobacco. He reports that he does not drink alcohol or use illicit drugs.  Allergies:  Allergies  Allergen Reactions  . Morphine Rash and Other (See Comments)    Irritability also    No prescriptions prior to admission    No results found for this or any previous visit (from the past 48 hour(s)). No results found.  Review of Systems  All other systems reviewed and are negative.   There were no vitals taken for this visit. Physical Exam  On examination patient has ischemic ulceration of the transtibial amputation with exposed tibia. Assessment/Plan Assessment: Gangrene right transtibial amputation with severe peripheral vascular disease.  Plan: Will plan for above-the-knee amputation. Risks and benefits were discussed including risk of the wound not healing. Patient states he understands was to proceed at this time.  [name] 04/02/2014, 6:39 AM

## 2014-04-02 NOTE — Progress Notes (Signed)
Pt rates pain 8/10 when awakened, sleeps when left alone. Dr Linna Caprice updated.

## 2014-04-02 NOTE — Progress Notes (Signed)
Dr Linna Caprice at bedside and gave pt Precedex for pain. # 1 PRBC begun,

## 2014-04-02 NOTE — Progress Notes (Signed)
Utilization review completed.  

## 2014-04-02 NOTE — Op Note (Signed)
OPERATIVE REPORT  DATE OF SURGERY: 04/02/2014  PATIENT:  Synthia Innocent,  79 y.o. male  PRE-OPERATIVE DIAGNOSIS:  Dehiscence Right Below Knee Amputation  POST-OPERATIVE DIAGNOSIS:  same  PROCEDURE:  Procedure(s): AMPUTATION ABOVE KNEE  SURGEON:  Surgeon(s): [name], MD  ANESTHESIA:   general  EBL:  min ML  SPECIMEN:  No Specimen  TOURNIQUET:  * No tourniquets in log *  PROCEDURE DETAILS: Patient is a 79 year old gentleman who is status post limb salvage surgery for the right lower extremity. Patient has undergone multiple revisions for a transtibial amputation. Patient was strongly opposed to an above-the-knee amputation and wanted to try everything possible to save his below-knee amputation. At this time patient had progressive gangrenous changes to the residual limb ulcer which probes down to bone. Do to the persistent ulcer osteomyelitis patient presents at this time for left knee amputations. Risk and benefits were discussed including risk of healing. Patient states he understands and wished to proceed at this time. Description of procedure patient was brought to the operating room and underwent a general anesthetic. After adequate levels and anesthesia obtained patient's right lower extremity was prepped using DuraPrep draped into a sterile field. A timeout was called. A fishmouth incision was made through the mid femoral region the residual limb was amputated through an above-the-knee amputation site. Hemostasis was obtained. Incision was closed using 2-0 nylon. The wound is covered with a sterile dressing. Patient was extubated taken to the PACU in stable condition.  PLAN OF CARE: Admit to inpatient   PATIENT DISPOSITION:  PACU - hemodynamically stable.   [name], MD 04/02/2014 11:39 AM

## 2014-04-02 NOTE — Anesthesia Postprocedure Evaluation (Signed)
  Anesthesia Post-op Note  Patient: [name]  Procedure(s) Performed: Procedure(s) with comments: AMPUTATION ABOVE KNEE (Right) - Right Above Knee Amputation  Patient Location: PACU  Anesthesia Type:General  Level of Consciousness: awake, alert  and oriented  Airway and Oxygen Therapy: Patient Spontanous Breathing and Patient connected to nasal cannula oxygen  Post-op Pain: mild  Post-op Assessment: Post-op Vital signs reviewed, Patient's Cardiovascular Status Stable, Respiratory Function Stable, Patent Airway and Pain level controlled  Post-op Vital Signs: stable  Last Vitals:  Filed Vitals:   04/02/14 1524  BP: 172/83  Pulse: 87  Temp: 36.7 C  Resp: 14    Complications: No apparent anesthesia complications

## 2014-04-03 ENCOUNTER — Encounter (HOSPITAL_COMMUNITY): Payer: Self-pay | Admitting: General Practice

## 2014-04-03 LAB — CBC WITH DIFFERENTIAL/PLATELET
BASOS ABS: 0 10*3/uL (ref 0.0–0.1)
Basophils Relative: 0 % (ref 0–1)
Eosinophils Absolute: 0.3 10*3/uL (ref 0.0–0.7)
Eosinophils Relative: 3 % (ref 0–5)
HCT: 28.3 % — ABNORMAL LOW (ref 39.0–52.0)
Hemoglobin: 9.2 g/dL — ABNORMAL LOW (ref 13.0–17.0)
LYMPHS ABS: 1.8 10*3/uL (ref 0.7–4.0)
LYMPHS PCT: 20 % (ref 12–46)
MCH: 27.7 pg (ref 26.0–34.0)
MCHC: 32.5 g/dL (ref 30.0–36.0)
MCV: 85.2 fL (ref 78.0–100.0)
Monocytes Absolute: 0.6 10*3/uL (ref 0.1–1.0)
Monocytes Relative: 7 % (ref 3–12)
NEUTROS ABS: 6.4 10*3/uL (ref 1.7–7.7)
NEUTROS PCT: 70 % (ref 43–77)
PLATELETS: 232 10*3/uL (ref 150–400)
RBC: 3.32 MIL/uL — AB (ref 4.22–5.81)
RDW: 15.4 % (ref 11.5–15.5)
WBC: 9.2 10*3/uL (ref 4.0–10.5)

## 2014-04-03 LAB — TYPE AND SCREEN
ABO/RH(D): B POS
ANTIBODY SCREEN: POSITIVE
DAT, IgG: NEGATIVE
UNIT DIVISION: 0
Unit division: 0

## 2014-04-03 LAB — GLUCOSE, CAPILLARY
GLUCOSE-CAPILLARY: 122 mg/dL — AB (ref 70–99)
Glucose-Capillary: 183 mg/dL — ABNORMAL HIGH (ref 70–99)
Glucose-Capillary: 220 mg/dL — ABNORMAL HIGH (ref 70–99)
Glucose-Capillary: 280 mg/dL — ABNORMAL HIGH (ref 70–99)

## 2014-04-03 NOTE — Progress Notes (Signed)
Patient ID: [name], male   DOB: [birth year], 79 y.o.   MRN: [id number] Postoperative day 1 above-the-knee amputation. Patient comfortable this morning. Plan for discharge back to skilled nursing facility on Saturday.

## 2014-04-03 NOTE — Evaluation (Signed)
Physical Therapy Evaluation Patient Details Name: [name] MRN: [id number] DOB: May 16, 1935 Today's Date: 04/03/2014   History of Present Illness  Pt. admitted 04/03/14 with gangrene of R AKA, s/p multiple surgeries for limb salvage.  Underwent  AKA  5/13.  Clinical Impression  Pt. Presents to PT up in room in his w/c, stating he has used toilet with supervision from staff.  He presents with decreased ability to stand and ambulate from prior functional level at SNF due to revision to AKA, though he did not attempt with PT this session  (he declined but states he know he wont be able to do as well as pre-op).  He will benefit from acute PT to maximize his functional level prior to return to [facility], likely on Saturday.  He would benefit from ongoing nursing  staff supervision for transfers to reduce likelihood of a fall. He apears to h show good safety and judgement.   Plan to advance activity as he tolerates and allows prior to return to SNF for therapies in prep for DC home.    Follow Up Recommendations SNF;Supervision/Assistance - 24 hour;Supervision for mobility/OOB    Equipment Recommendations  None recommended by PT    Recommendations for Other Services       Precautions / Restrictions Precautions Precautions: Fall Restrictions Weight Bearing Restrictions: Yes RLE Weight Bearing: Non weight bearing      Mobility  Bed Mobility Overal bed mobility:  (not assessed; pt. in his WC at the bedside)                Transfers Overall transfer level:  (pt. declined)                  Ambulation/Gait Ambulation/Gait assistance:  (pt. declined)              Hotel manager mobility: Yes Wheelchair propulsion: Both upper extremities;Right upper extremity;Left lower extremity Wheelchair parts: Independent Distance: in room at the bedside Wheelchair Assistance Details (indicate cue type and  reason): pt. managing independently for short distances in room  Modified Rankin (Stroke Patients Only)       Balance                                             Pertinent Vitals/Pain See vitals tab     Home Living Family/patient expects to be discharged to:: Skilled nursing facility Living Arrangements: Spouse/significant other               Additional Comments: Pt. has recently been at [facility] for rehab following his multiple surgeries and intends to DC back there    Prior Function Level of Independence: Needs assistance   Gait / Transfers Assistance Needed: Pt. reports independent with WC to bed and toilet transfers and limited "hopping" with RW at SNF.           Hand Dominance        Extremity/Trunk Assessment   Upper Extremity Assessment: Generalized weakness           Lower Extremity Assessment: RLE deficits/detail RLE Deficits / Details: Pt. able to lift rithe residual limb minimally from seated postition and politely declined to attempt more LLE Deficits / Details: strength grossly 3+/5 distally, 3/5 proximally     Communication  Communication: No difficulties  Cognition Arousal/Alertness: Awake/alert Behavior During Therapy: WFL for tasks assessed/performed Overall Cognitive Status: Within Functional Limits for tasks assessed                      General Comments      Exercises Amputee Exercises Hip Extension: Other (comment) (pt. declined back to bed, instructed in sidelying hip ext.)      Assessment/Plan    PT Assessment Patient needs continued PT services  PT Diagnosis Difficulty walking;Generalized weakness;Abnormality of gait   PT Problem List Decreased strength;Decreased activity tolerance;Decreased mobility;Decreased balance;Pain  PT Treatment Interventions DME instruction;Gait training;Functional mobility training;Therapeutic activities;Therapeutic exercise;Balance training;Patient/family  education   PT Goals (Current goals can be found in the Care Plan section) Acute Rehab PT Goals Patient Stated Goal: pt. is adamant that he wants to leave SNF level walking as he says his home will not accomodate a WC PT Goal Formulation: With patient Time For Goal Achievement: 04/10/14 Potential to Achieve Goals: Fair    Frequency Min 3X/week   Barriers to discharge Inaccessible home environment pt. currently needs ongoing rehab in SNF setting to reach mod I level for home    Co-evaluation               End of Session   Activity Tolerance: Patient tolerated treatment well Patient left: in chair;with call bell/phone within reach           Time: 1121-1141 PT Time Calculation (min): 20 min   Charges:   PT Evaluation $Initial PT Evaluation Tier I: 1 Procedure PT Treatments $Therapeutic Activity: 8-22 mins   PT G CodesLadona Ridgel 04/03/2014, 12:41 PM Gerlean Ren PT Acute Rehab Services [place] [phone]

## 2014-04-04 ENCOUNTER — Encounter: Payer: Medicare Other | Admitting: Internal Medicine

## 2014-04-04 ENCOUNTER — Encounter (HOSPITAL_COMMUNITY): Payer: Self-pay | Admitting: Orthopedic Surgery

## 2014-04-04 LAB — CBC WITH DIFFERENTIAL/PLATELET
BASOS PCT: 0 % (ref 0–1)
Basophils Absolute: 0 10*3/uL (ref 0.0–0.1)
Eosinophils Absolute: 0.3 10*3/uL (ref 0.0–0.7)
Eosinophils Relative: 4 % (ref 0–5)
HEMATOCRIT: 24.6 % — AB (ref 39.0–52.0)
Hemoglobin: 8.2 g/dL — ABNORMAL LOW (ref 13.0–17.0)
LYMPHS PCT: 21 % (ref 12–46)
Lymphs Abs: 1.7 10*3/uL (ref 0.7–4.0)
MCH: 29.2 pg (ref 26.0–34.0)
MCHC: 33.3 g/dL (ref 30.0–36.0)
MCV: 87.5 fL (ref 78.0–100.0)
MONO ABS: 0.7 10*3/uL (ref 0.1–1.0)
MONOS PCT: 8 % (ref 3–12)
NEUTROS ABS: 5.1 10*3/uL (ref 1.7–7.7)
Neutrophils Relative %: 67 % (ref 43–77)
Platelets: 184 10*3/uL (ref 150–400)
RBC: 2.81 MIL/uL — ABNORMAL LOW (ref 4.22–5.81)
RDW: 15.4 % (ref 11.5–15.5)
WBC: 7.8 10*3/uL (ref 4.0–10.5)

## 2014-04-04 LAB — GLUCOSE, CAPILLARY
GLUCOSE-CAPILLARY: 195 mg/dL — AB (ref 70–99)
GLUCOSE-CAPILLARY: 195 mg/dL — AB (ref 70–99)
Glucose-Capillary: 179 mg/dL — ABNORMAL HIGH (ref 70–99)
Glucose-Capillary: 210 mg/dL — ABNORMAL HIGH (ref 70–99)

## 2014-04-04 MED ORDER — METHOCARBAMOL 500 MG PO TABS
500.0000 mg | ORAL_TABLET | Freq: Three times a day (TID) | ORAL | Status: DC
Start: 2014-04-04 — End: 2014-11-10

## 2014-04-04 MED ORDER — OXYCODONE-ACETAMINOPHEN 5-325 MG PO TABS: 1.0000 | ORAL_TABLET | ORAL | Status: DC | PRN

## 2014-04-04 NOTE — Discharge Summary (Signed)
Physician Discharge Summary  Patient ID: [name] MRN: [id number] DOB/AGE: 77-Nov-1936 77 y.o.  Admit date: 04/02/2014 Discharge date: 04/04/2014  Admission Diagnoses: Gangrene right below the knee amputation  Discharge Diagnoses:  Active Problems:   Gangrene associated with type 2 diabetes mellitus   Discharged Condition: stable  Hospital Course: Patient's hospital course was essentially unremarkable. He underwent above-knee amputation the right. Postoperatively patient progressed well and was discharged back to skilled nursing.  Consults: None  Significant Diagnostic Studies: labs: Routine labs  Treatments: surgery: See operative note  Discharge Exam: Blood pressure 130/64, pulse 76, temperature 98.6 F (37 C), temperature source Oral, resp. rate 18, height 5\' 10"  (1.778 m), weight 72.122 kg (159 lb), SpO2 99.00%. Incision/Wound: dressing clean dry and intact  Disposition: 03-Skilled Nursing Facility  Discharge Instructions   Call MD / Call 911    Complete by:  As directed   If you experience chest pain or shortness of breath, CALL 911 and be transported to the hospital emergency room.  If you develope a fever above 101 F, pus (white drainage) or increased drainage or redness at the wound, or calf pain, call your surgeon's office.     Constipation Prevention    Complete by:  As directed   Drink plenty of fluids.  Prune juice may be helpful.  You may use a stool softener, such as Colace (over the counter) 100 mg twice a day.  Use MiraLax (over the counter) for constipation as needed.     Diet - low sodium heart healthy    Complete by:  As directed      Increase activity slowly as tolerated    Complete by:  As directed             Medication List         cephALEXin 500 MG capsule  Commonly known as:  KEFLEX  Take 1 capsule (500 mg total) by mouth every 6 (six) hours.     DECUBI-VITE Caps  Take 1 capsule by mouth daily.     digoxin 0.125 MG tablet   Commonly known as:  LANOXIN  Take 0.125 mg by mouth daily.     diltiazem 180 MG 24 hr capsule  Commonly known as:  CARDIZEM CD  Take 1 capsule (180 mg total) by mouth daily.     doxycycline 100 MG tablet  Commonly known as:  VIBRA-TABS  Take 1 tablet (100 mg total) by mouth 2 (two) times daily.     ELIQUIS 5 MG Tabs tablet  Generic drug:  apixaban  Take 5 mg by mouth daily.     feeding supplement (GLUCERNA 1.2 CAL) Liqd  Take by mouth 2 (two) times daily.     ferrous fumarate 325 (106 FE) MG Tabs tablet  Commonly known as:  HEMOCYTE - 106 mg FE  Take 1 tablet (106 mg of iron total) by mouth daily at 12 noon.     furosemide 40 MG tablet  Commonly known as:  LASIX  Take 40 mg by mouth daily.     gabapentin 100 MG capsule  Commonly known as:  NEURONTIN  Take 1 capsule (100 mg total) by mouth every 12 (twelve) hours.     Insulin Glargine 100 UNIT/ML Solostar Pen  Commonly known as:  LANTUS SOLOSTAR  Inject 20 Units into the skin at bedtime.     lisinopril 10 MG tablet  Commonly known as:  PRINIVIL,ZESTRIL  Take 10 mg by mouth daily.  LORazepam 0.5 MG tablet  Commonly known as:  ATIVAN  Take 0.5 tablets (0.25 mg total) by mouth 2 (two) times daily as needed for anxiety.     Melatonin 5 MG Tabs  Take 5 mg by mouth at bedtime as needed (insomnia).     methocarbamol 500 MG tablet  Commonly known as:  ROBAXIN  Take 1 tablet (500 mg total) by mouth 3 (three) times daily.     NOVOLOG FLEXPEN 100 UNIT/ML FlexPen  Generic drug:  insulin aspart  Inject 0-10 Units into the skin 3 (three) times daily with meals. Per sliding scale:  cbg 150-250 5 units, 251-300 8 units, 301-500 10 units (call MD if over 350)     omeprazole 20 MG capsule  Commonly known as:  PRILOSEC  Take 20 mg by mouth 2 (two) times daily before a meal.     oxyCODONE-acetaminophen 10-325 MG per tablet  Commonly known as:  PERCOCET  Take 1 tablet by mouth every 6 (six) hours as needed for pain.      oxyCODONE-acetaminophen 5-325 MG per tablet  Commonly known as:  ROXICET  Take 1 tablet by mouth every 4 (four) hours as needed for severe pain.     SENNA-S 8.6-50 MG per tablet  Generic drug:  senna-docusate  Take 1 tablet by mouth daily.     silver sulfADIAZINE 1 % cream  Commonly known as:  SILVADENE  Apply 1 application topically daily.           Follow-up Information   Follow up with Dylon Correa V, MD In 2 weeks.   Specialty:  Orthopedic Surgery   Contact information:   [address] [place] [address] [phone]       Signed: [name] 04/04/2014, 6:33 AM

## 2014-04-04 NOTE — Progress Notes (Signed)
Physical Therapy Treatment Patient Details Name: [name] MRN: [id number] DOB: 19-Jun-1935 Today's Date: 04/04/2014    History of Present Illness Pt. admitted 04/03/14 with gangrene of R AKA, s/p multiple surgeries for limb salvage.  Underwent  AKA  5/13.    PT Comments    Patient polite and agreeable to work with therapy however very picky about how he like his task completed. Progressing well and has good recall of techniques. Planning to DC back to SNF tomorrow  Follow Up Recommendations  SNF;Supervision/Assistance - 24 hour;Supervision for mobility/OOB     Equipment Recommendations  None recommended by PT    Recommendations for Other Services       Precautions / Restrictions Precautions Precautions: Fall Restrictions RLE Weight Bearing: Non weight bearing    Mobility  Bed Mobility   Bed Mobility: Supine to Sit     Supine to sit: Supervision;HOB elevated     General bed mobility comments: With increased time and heavy reliance on bed rails and elevation  Transfers Overall transfer level: Needs assistance   Transfers: Sit to/from Stand;Stand Pivot Transfers Sit to Stand: Min guard Stand pivot transfers: Min guard       General transfer comment: Patient able to stand and complete stand pivot transfer while holding onto WC. Patient adament about not recieving assistance from PTA. MinGuard for safety but overall performed well  Ambulation/Gait                 Stairs            Wheelchair Mobility    Modified Rankin (Stroke Patients Only)       Balance     Sitting balance-Leahy Scale: Fair                              Cognition Arousal/Alertness: Awake/alert Behavior During Therapy: WFL for tasks assessed/performed Overall Cognitive Status: Within Functional Limits for tasks assessed                      Exercises      General Comments        Pertinent Vitals/Pain no apparent distress     Home  Living                      Prior Function            PT Goals (current goals can now be found in the care plan section) Progress towards PT goals: Progressing toward goals    Frequency  Min 3X/week    PT Plan Current plan remains appropriate    Co-evaluation             End of Session Equipment Utilized During Treatment: Gait belt Activity Tolerance: Patient tolerated treatment well Patient left: in chair;with call bell/phone within reach     Time: 0924-0951 PT Time Calculation (min): 27 min  Charges:  $Therapeutic Activity: 23-37 mins                    G Codes:      Tonia Brooms Gaetana Kawahara 04/04/2014, 10:30 AM 04/04/2014 [place] PTA [phone] pager [phone] office

## 2014-04-04 NOTE — Discharge Instructions (Signed)
Change dressing right above-the-knee amputation as needed.

## 2014-04-04 NOTE — Progress Notes (Signed)
Patient ID: [name], male   DOB: [birth year], 79 y.o.   MRN: [id number] Plan for discharge to skilled nursing on Saturday. Discharge summary and prescriptions on the chart.

## 2014-04-05 LAB — CBC WITH DIFFERENTIAL/PLATELET
Basophils Absolute: 0 10*3/uL (ref 0.0–0.1)
Basophils Relative: 0 % (ref 0–1)
Eosinophils Absolute: 0.5 10*3/uL (ref 0.0–0.7)
Eosinophils Relative: 6 % — ABNORMAL HIGH (ref 0–5)
HCT: 25.4 % — ABNORMAL LOW (ref 39.0–52.0)
Hemoglobin: 8.3 g/dL — ABNORMAL LOW (ref 13.0–17.0)
LYMPHS ABS: 1.8 10*3/uL (ref 0.7–4.0)
Lymphocytes Relative: 25 % (ref 12–46)
MCH: 28.1 pg (ref 26.0–34.0)
MCHC: 32.7 g/dL (ref 30.0–36.0)
MCV: 86.1 fL (ref 78.0–100.0)
MONO ABS: 0.6 10*3/uL (ref 0.1–1.0)
Monocytes Relative: 8 % (ref 3–12)
NEUTROS PCT: 61 % (ref 43–77)
Neutro Abs: 4.5 10*3/uL (ref 1.7–7.7)
Platelets: 183 10*3/uL (ref 150–400)
RBC: 2.95 MIL/uL — AB (ref 4.22–5.81)
RDW: 15.3 % (ref 11.5–15.5)
WBC: 7.4 10*3/uL (ref 4.0–10.5)

## 2014-04-05 LAB — GLUCOSE, CAPILLARY
GLUCOSE-CAPILLARY: 115 mg/dL — AB (ref 70–99)
Glucose-Capillary: 170 mg/dL — ABNORMAL HIGH (ref 70–99)

## 2014-04-05 MED ORDER — GLUCERNA SHAKE PO LIQD
237.0000 mL | Freq: Two times a day (BID) | ORAL | Status: DC
  Administered 2014-04-05: 237 mL via ORAL

## 2014-04-05 NOTE — Progress Notes (Signed)
Patient doing well  strenuously desires discharge to nursing home today He is ready f from orthopedic perspective

## 2014-04-05 NOTE — Progress Notes (Signed)
Clinical Social Work Department BRIEF PSYCHOSOCIAL ASSESSMENT 04/05/2014  Patient:  [name], [name]     Account Number:  [id number]     Admit date:  04/02/2014  Clinical Social Worker:  Rolinda Roan  Date/Time:  04/05/2014 01:03 PM  Referred by:  Physician  Date Referred:  04/04/2014 Referred for  SNF Placement   Other Referral:   Interview type:  Patient Other interview type:    PSYCHOSOCIAL DATA Living Status:  FACILITY Admitted from facility:  [place], [place] Level of care:  [place] Primary support name:  Valdemar Mcclenahan Primary support relationship to patient:  SPOUSE Degree of support available:   Good support per patient.    CURRENT CONCERNS  Other Concerns:    SOCIAL WORK ASSESSMENT / PLAN Clinical Social Worker (CSW) met with patient to discuss D/C plan. Patient reported that he would like to go back to [name] and finish his rehab.   Assessment/plan status:  Psychosocial Support/Ongoing Assessment of Needs Other assessment/ plan:   Information/referral to community resources:    PATIENT'S/FAMILY'S RESPONSE TO PLAN OF CARE: Patient is agreeable to returning to [facility].

## 2014-04-05 NOTE — Progress Notes (Signed)
reporrt phoned to [facility]).

## 2014-04-05 NOTE — Progress Notes (Signed)
LCSW arrangements for pt to d/c from hospital via wheelchair transport, self pay d/t refusal for personal wheelchair to be picked up by relatives. [facility] for [facility]. Iv x2 out. Percocet x 2 po given for pain. Pt accepts responsibility for private payment and leaves floor with personal belongings and papers for accepting facility

## 2014-04-05 NOTE — Progress Notes (Signed)
Patient is medically stable for D/C to [facility] today. Per Vicente Males weekend supervisor at [facility] reported that patient is going to room 107. Clinical Education officer, museum (CSW) prepared D/C packet. Patient is transporting to facility via [place]. Patient's wife is dropping off a $35.00 check at the facility for [facility] is picking patient up at 2 pm today. Please reconsult if further social work needs arise. CSW signing off.   Blima Rich, [place] Weekend CSW [phone]

## 2014-04-05 NOTE — Progress Notes (Signed)
INITIAL NUTRITION ASSESSMENT  DOCUMENTATION CODES Per approved criteria  -Severe malnutrition in the context of chronic illness   Pt meets criteria for severe MALNUTRITION in the context of chronic illness as evidenced by severe fat and muscle mass loss.  INTERVENTION: Change Glucerna 1.2 to Glucerna Shake po BID Encouraged adequate protein intake for wound healing RD to continue to follow nutrition care plan  NUTRITION DIAGNOSIS: Increased nutrient needs related to post-op healing as evidenced by estimated needs.   Goal: Pt to meet >/=90% of estimated nutrition needs  Monitor:  PO intake, supplement acceptance, weight trend, labs   Reason for Assessment: Positive Malnutrition Screening Tool Score   79 y.o. male  Admitting Dx: Gangrene associated with Type II DM   ASSESSMENT: Patient is a 79 year old gentleman with severe peripheral vascular disease. Patient has undergone multiple attempts at limb salvage to maintain a transtibial amputation the right per the patient's request. Patient presents at this time with progressive gangrenous changes to the transtibial amputation does not have any further limb salvage options and presents for an above-the-knee amputation.  PMHx significant for GERD, DM II, HTN, Gout, Heart failure, Diverticulosis.   Pt states that he is eating 100% and EPIC chart confirms this.   Per chart review, pt is receiving Glucerna 1.2 (enteral feeding) instead of Glucerna Shake for oral intake. Will change this in orders. Pt states that he really likes the taste of Glucerna and drinks it at home.   Pt states that he has no recent weight loss.   CBG's: 195, 210, 115  Pt to be d/c today.   Nutrition Focused Physical Exam:  Subcutaneous Fat:  Orbital Region: WNL Upper Arm Region: severe depletion  Thoracic and Lumbar Region: n/a   Muscle:  Temple Region: moderate depletion  Clavicle Bone Region: severe depletion  Clavicle and Acromion Bone Region:  moderate depletion  Scapular Bone Region: n/a  Dorsal Hand: n/a  Patellar Region: n/a Anterior Thigh Region: n/a Posterior Calf Region: n/a   Edema: none   Height: Ht Readings from Last 1 Encounters:  04/02/14 5\' 10"  (1.778 m)    Weight: Wt Readings from Last 1 Encounters:  04/02/14 159 lb (72.122 kg)    Ideal Body Weight: 69.5 kg , adjusted for R AKA  % Ideal Body Weight: 95%  Wt Readings from Last 10 Encounters:  04/02/14 159 lb (72.122 kg)  04/02/14 159 lb (72.122 kg)  03/21/14 157 lb (71.215 kg)  03/20/14 157 lb 13.6 oz (71.6 kg)  03/20/14 157 lb 13.6 oz (71.6 kg)  03/14/14 158 lb (71.668 kg)  03/09/14 163 lb 5.8 oz (74.1 kg)  03/07/14 161 lb (73.029 kg)  03/07/14 161 lb (73.029 kg)  03/03/14 161 lb 13.1 oz (73.4 kg)    Usual Body Weight: 180 - 185 lbs (1 year ago)  % Usual Body Weight: 96%   BMI: 24.9 kg/ m^2, adjusted for R AKA  Estimated Nutritional Needs: Kcal: 1800 - 2000 Protein: 85 - 95 grams  Fluid: 1.8 - 2 L/day  Skin: Stage II R, L Buttock Pressure Ulcer   Diet Order: Carb Control  EDUCATION NEEDS: -Education needs addressed   Intake/Output Summary (Last 24 hours) at 04/05/14 0908 Last data filed at 04/05/14 0530  Gross per 24 hour  Intake    360 ml  Output   1100 ml  Net   -740 ml    Last BM: 5/13    Labs:   Recent Labs Lab 04/02/14 0835  NA 138  K 4.9  CL 100  CO2 26  BUN 34*  CREATININE 1.44*  CALCIUM 9.2  GLUCOSE 150*    CBG (last 3)   Recent Labs  04/04/14 1554 04/04/14 2156 04/05/14 0643  GLUCAP 195* 210* 115*    Scheduled Meds: . aspirin EC  325 mg Oral Daily  . digoxin  0.125 mg Oral Daily  . diltiazem  180 mg Oral Daily  . feeding supplement (GLUCERNA 1.2 CAL)  237 mL Oral BID  . ferrous fumarate  1 tablet Oral Q1200  . gabapentin  100 mg Oral Q12H  . insulin aspart  0-9 Units Subcutaneous TID WC  . insulin aspart  3 Units Subcutaneous TID WC  . insulin glargine  20 Units Subcutaneous QHS  .  lisinopril  10 mg Oral Daily  . pantoprazole  40 mg Oral Daily  . senna-docusate  1 tablet Oral Daily    Continuous Infusions: . sodium chloride 10 mL/hr at 04/02/14 2150    Past Medical History  Diagnosis Date  . Fatigue   . Overweight   . Syncope and collapse   . GERD (gastroesophageal reflux disease)   . Right bundle branch block   . Unspecified venous (peripheral) insufficiency   . Phlebitis and thrombophlebitis of superficial vessels of lower extremities   . Cellulitis and abscess of leg, except foot   . Backache, unspecified   . Long term (current) use of anticoagulants   . Type II or unspecified type diabetes mellitus without mention of complication, not stated as uncontrolled   . Unspecified venous (peripheral) insufficiency   . Hypertension   . Gout, unspecified   . Heart failure   . Atrial fibrillation     permanent  . Esophageal stricture   . Hx of colonic polyps 05-2006    (Adenomatous)Dr. [name]  . Diverticulosis 05-2006    Dr. [name]   . Tachycardia-bradycardia     s/p PPM  . Pacemaker- [name] 02/20/2013  . CHF (congestive heart failure)     Past Surgical History  Procedure Laterality Date  . Laminectomy    . Lumbar fusion    . Rotator cuff repair    . Ruptured rt rectus muscle    . Eye surgery    . Pacemaker insertion  8/12    SJM by [name] for tachy/brady syndrome  . I&d extremity Right 01/17/2014    Procedure: IRRIGATION AND DEBRIDEMENT RIGHT HEEL  WITH CULTURES AND BONE BIOPSY, placement of wound vac;  Surgeon: Theodoro Kos, DO;  Location: [facility];  Service: Plastics;  Laterality: Right;  . Amputation Right 01/20/2014    Procedure: AMPUTATION BELOW KNEE- right;  Surgeon: [name], MD;  Location: [place];  Service: Orthopedics;  Laterality: Right;  Right Below Knee Amputation  . Amputation Right 02/14/2014    Procedure: AMPUTATION BELOW KNEE;  Surgeon: [name], MD;  Location: [facility];  Service: Orthopedics;  Laterality: Right;  Right Below  Knee Amputation Revision  . Amputation Right 03/07/2014    Procedure: AMPUTATION BELOW KNEE;  Surgeon: [name], MD;  Location: [place];  Service: Orthopedics;  Laterality: Right;  Right Below Knee Amputation Revision  . Amputation Right 03/18/2014    Procedure: revision of  AMPUTATION BELOW KNEE;  Surgeon: [name], MD;  Location: [place];  Service: Orthopedics;  Laterality: Right;  Revision Right Below Knee Amputation  . Above knee leg amputation Right 04/02/2014    DR DUDA  . Amputation Right 04/02/2014  Procedure: AMPUTATION ABOVE KNEE;  Surgeon: [name], MD;  Location: [place];  Service: Orthopedics;  Laterality: Right;  Right Above Knee Amputation    Carrolyn Leigh, BS Dietetic Intern Pager: [phone]  I agree with the above information and made appropriate revisions. Inda Coke MS, RD, LDN Inpatient Registered Dietitian Pager: [phone] After-hours pager: [phone]

## 2014-04-07 ENCOUNTER — Ambulatory Visit: Payer: Medicare Other | Admitting: Infectious Disease

## 2014-04-07 ENCOUNTER — Other Ambulatory Visit: Payer: Self-pay

## 2014-04-07 MED ORDER — LORAZEPAM 0.5 MG PO TABS: 0.2500 mg | ORAL_TABLET | Freq: Two times a day (BID) | ORAL | Status: DC | PRN

## 2014-04-07 MED ORDER — OXYCODONE-ACETAMINOPHEN 10-325 MG PO TABS: 1.0000 | ORAL_TABLET | Freq: Four times a day (QID) | ORAL | Status: DC | PRN

## 2014-04-07 NOTE — Telephone Encounter (Signed)
Rx faxed to [facility] @ [phone]. Phone number [phone]  

## 2014-04-07 NOTE — Addendum Note (Signed)
Addended by: Logan Bores on: 04/07/2014 12:06 PM   Modules accepted: Orders

## 2014-04-08 ENCOUNTER — Non-Acute Institutional Stay (SKILLED_NURSING_FACILITY): Payer: Medicare Other | Admitting: Internal Medicine

## 2014-04-08 ENCOUNTER — Encounter: Payer: Self-pay | Admitting: Internal Medicine

## 2014-04-08 DIAGNOSIS — I739 Peripheral vascular disease, unspecified: Secondary | ICD-10-CM

## 2014-04-08 DIAGNOSIS — I5032 Chronic diastolic (congestive) heart failure: Secondary | ICD-10-CM

## 2014-04-08 DIAGNOSIS — I4891 Unspecified atrial fibrillation: Secondary | ICD-10-CM

## 2014-04-08 DIAGNOSIS — S78111A Complete traumatic amputation at level between right hip and knee, initial encounter: Secondary | ICD-10-CM | POA: Insufficient documentation

## 2014-04-08 DIAGNOSIS — S78119A Complete traumatic amputation at level between unspecified hip and knee, initial encounter: Secondary | ICD-10-CM

## 2014-04-08 DIAGNOSIS — Z89619 Acquired absence of unspecified leg above knee: Secondary | ICD-10-CM

## 2014-04-08 DIAGNOSIS — E1152 Type 2 diabetes mellitus with diabetic peripheral angiopathy with gangrene: Secondary | ICD-10-CM

## 2014-04-08 DIAGNOSIS — I96 Gangrene, not elsewhere classified: Secondary | ICD-10-CM

## 2014-04-08 DIAGNOSIS — E1159 Type 2 diabetes mellitus with other circulatory complications: Secondary | ICD-10-CM

## 2014-04-08 DIAGNOSIS — I509 Heart failure, unspecified: Secondary | ICD-10-CM

## 2014-04-08 NOTE — Progress Notes (Signed)
Patient ID: [name], male   DOB: Jun 10, 1935, 79 y.o.   MRN: [id number]  Location: [facility] Provider:  Jonelle Cross L. [name], D.O., C.M.D.  Code Status:  DNR  Chief Complaint  Patient presents with  . Hospitalization Follow-up    readmission s/p hospitalization for gangrene right at his amputation site    HPI:  79 yo white male with h/o DMII with vascular complications and osteomyelitis of his R LE, PAD, past TIA, dysphagia with esophageal stricture, pain med induced constipation, chronic low back pain, alcohol abuse readmitted after undergoing Right AKA by Dr. Sharol Given due to gangrene at his BKA site.      He saw Dr. Sharol Given again yesterday and was told that the latest surgical site finally looks good.  [name] is very happy about this.  He otherwise had complaints about food and having to wait to get his medication b/c he is not the only one with meds due at certain times.  He also didn't understand why he needed to go to ID again, but understood when we explained his last visit was missed.    Review of Systems:  Review of Systems  Constitutional: Negative for malaise/fatigue.  Eyes: Negative for blurred vision.       Wears glasses  Respiratory: Negative for shortness of breath.   Cardiovascular: Negative for chest pain and leg swelling.  Gastrointestinal: Negative for abdominal pain, constipation, blood in stool and melena.  Genitourinary: Negative for dysuria.  Musculoskeletal: Negative for falls.       Leg pain controlled with medications  Skin: Negative for rash.  Neurological: Negative for dizziness, weakness and headaches.  Endo/Heme/Allergies: Bruises/bleeds easily.  Psychiatric/Behavioral: The patient has insomnia.        Ok with medication and pain pill    Medications: Patient's Medications  New Prescriptions   No medications on file  Previous Medications   APIXABAN (ELIQUIS) 5 MG TABS TABLET    Take 5 mg by mouth daily.   CEPHALEXIN (KEFLEX) 500  MG CAPSULE    Take 1 capsule (500 mg total) by mouth every 6 (six) hours.   DIGOXIN (LANOXIN) 0.125 MG TABLET    Take 0.125 mg by mouth daily.   DILTIAZEM (CARDIZEM CD) 180 MG 24 HR CAPSULE    Take 1 capsule (180 mg total) by mouth daily.   DOXYCYCLINE (VIBRA-TABS) 100 MG TABLET    Take 1 tablet (100 mg total) by mouth 2 (two) times daily.   FERROUS FUMARATE (HEMOCYTE - 106 MG FE) 325 (106 FE) MG TABS TABLET    Take 1 tablet (106 mg of iron total) by mouth daily at 12 noon.   FUROSEMIDE (LASIX) 40 MG TABLET    Take 40 mg by mouth daily.    GABAPENTIN (NEURONTIN) 100 MG CAPSULE    Take 1 capsule (100 mg total) by mouth every 12 (twelve) hours.   INSULIN ASPART (NOVOLOG FLEXPEN) 100 UNIT/ML FLEXPEN    Inject 0-10 Units into the skin 3 (three) times daily with meals. Per sliding scale:  cbg 150-250 5 units, 251-300 8 units, 301-500 10 units (call MD if over 350)   INSULIN GLARGINE (LANTUS SOLOSTAR) 100 UNIT/ML SOLOSTAR PEN    Inject 20 Units into the skin at bedtime.   LISINOPRIL (PRINIVIL,ZESTRIL) 10 MG TABLET    Take 10 mg by mouth daily.   LORAZEPAM (ATIVAN) 0.5 MG TABLET    Take 0.5 tablets (0.25 mg total) by mouth 2 (two) times daily as needed  for anxiety.   MELATONIN 5 MG TABS    Take 5 mg by mouth at bedtime as needed (insomnia).   METHOCARBAMOL (ROBAXIN) 500 MG TABLET    Take 1 tablet (500 mg total) by mouth 3 (three) times daily.   MULTIPLE VITAMINS-MINERALS (DECUBI-VITE) CAPS    Take 1 capsule by mouth daily.    NUTRITIONAL SUPPLEMENTS (FEEDING SUPPLEMENT, GLUCERNA 1.2 CAL,) LIQD    Take by mouth 2 (two) times daily.   OMEPRAZOLE (PRILOSEC) 20 MG CAPSULE    Take 20 mg by mouth 2 (two) times daily before a meal.   OXYCODONE-ACETAMINOPHEN (PERCOCET) 10-325 MG PER TABLET    Take 1 tablet by mouth every 6 (six) hours as needed for pain.   OXYCODONE-ACETAMINOPHEN (ROXICET) 5-325 MG PER TABLET    Take 1 tablet by mouth every 4 (four) hours as needed for severe pain.   SENNA-DOCUSATE (SENNA-S)  8.6-50 MG PER TABLET    Take 1 tablet by mouth daily.   SILVER SULFADIAZINE (SILVADENE) 1 % CREAM    Apply 1 application topically daily.  Modified Medications   No medications on file  Discontinued Medications   No medications on file    Physical Exam: Filed Vitals:   04/08/14 1129  BP: 149/69  Pulse: 80  Temp: 96.8 F (36 C)  Resp: 18  Height: 5\' 10"  (1.778 m)  Weight: 159 lb (72.122 kg)   Physical Exam  Constitutional: He is oriented to person, place, and time. He appears well-developed and well-nourished. No distress.  HENT:  Head: Normocephalic and atraumatic.  Cardiovascular:  irreg irreg  Pulmonary/Chest: Effort normal and breath sounds normal. No respiratory distress.  Abdominal: Soft. Bowel sounds are normal. He exhibits no distension and no mass. There is no tenderness.  Musculoskeletal: Normal range of motion.  Neurological: He is alert and oriented to person, place, and time.  Skin:  Right AKA with dressing that is clean and dry today  Psychiatric: He has a normal mood and affect.   Labs reviewed: Basic Metabolic Panel:  Recent Labs  03/19/14 0543 03/20/14 0645 04/02/14 0835  NA 141 137 138  K 4.1 4.1 4.9  CL 103 98 100  CO2 26 26 26   GLUCOSE 106* 95 150*  BUN 25* 25* 34*  CREATININE 1.40* 1.50* 1.44*  CALCIUM 8.8 8.7 9.2    Liver Function Tests:  Recent Labs  03/02/14 0710 03/15/14 2019 03/16/14 0640  AST 12 13 12   ALT 7 10 9   ALKPHOS 95 122* 98  BILITOT 0.9 0.4 0.4  PROT 6.4 6.1 5.5*  ALBUMIN 2.6* 2.3* 2.2*    CBC:  Recent Labs  04/03/14 0550 04/04/14 0430 04/05/14 0615  WBC 9.2 7.8 7.4  NEUTROABS 6.4 5.1 4.5  HGB 9.2* 8.2* 8.3*  HCT 28.3* 24.6* 25.4*  MCV 85.2 87.5 86.1  PLT 232 184 183   Assessment/Plan 1. 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 S/P AKA (above knee  amputation) unilateral -dressing clean and dry today -keep f/u with ID tomorrow and Dr. Sharol Given in 2 wks -remains on doxycycline and keflex due to osteomyelitis he had--appears these are due to be discontinued tomorrow, 5/20 from review of d/c summary dates 03/10/14 (was to be a one month course)  3. PAD (peripheral artery disease) -contributor to his limb loss, secondary to DMII  4. Type II or unspecified type  diabetes mellitus with peripheral circulatory disorders, not stated as uncontrolled(250.70) -cont current insulin regimen, no concerns about lows  5. Atrial fibrillation -rate controlled, but irregular -on eliquis long term  6. Chronic diastolic CHF (congestive heart failure) -stable on current therapy, no s/s of exacerbation  F/u with Dr. Drucilla Schmidt 04/09/14 F/u with Dr. Sharol Given in 2 wks Podiatry Dr. [name] 05/15/14  Family/ staff Communication: seen with unit supervisor  Goals of care: here for rehab with goal to return to home  Labs/tests ordered:  Cbc, bmp in 1 wk

## 2014-04-09 ENCOUNTER — Ambulatory Visit (INDEPENDENT_AMBULATORY_CARE_PROVIDER_SITE_OTHER): Payer: Medicare Other | Admitting: Infectious Disease

## 2014-04-09 ENCOUNTER — Encounter: Payer: Self-pay | Admitting: Infectious Disease

## 2014-04-09 VITALS — BP 138/66 | HR 98 | Temp 98.3°F | Ht 70.0 in | Wt 159.0 lb

## 2014-04-09 DIAGNOSIS — Z89619 Acquired absence of unspecified leg above knee: Secondary | ICD-10-CM

## 2014-04-09 DIAGNOSIS — A491 Streptococcal infection, unspecified site: Secondary | ICD-10-CM

## 2014-04-09 DIAGNOSIS — M869 Osteomyelitis, unspecified: Secondary | ICD-10-CM

## 2014-04-09 DIAGNOSIS — E11628 Type 2 diabetes mellitus with other skin complications: Secondary | ICD-10-CM

## 2014-04-09 DIAGNOSIS — B951 Streptococcus, group B, as the cause of diseases classified elsewhere: Secondary | ICD-10-CM

## 2014-04-09 DIAGNOSIS — E1169 Type 2 diabetes mellitus with other specified complication: Secondary | ICD-10-CM

## 2014-04-09 DIAGNOSIS — L089 Local infection of the skin and subcutaneous tissue, unspecified: Secondary | ICD-10-CM

## 2014-04-09 DIAGNOSIS — L97509 Non-pressure chronic ulcer of other part of unspecified foot with unspecified severity: Secondary | ICD-10-CM

## 2014-04-09 DIAGNOSIS — I96 Gangrene, not elsewhere classified: Secondary | ICD-10-CM

## 2014-04-09 DIAGNOSIS — T879 Unspecified complications of amputation stump: Secondary | ICD-10-CM

## 2014-04-09 DIAGNOSIS — E11621 Type 2 diabetes mellitus with foot ulcer: Secondary | ICD-10-CM

## 2014-04-09 DIAGNOSIS — E1159 Type 2 diabetes mellitus with other circulatory complications: Secondary | ICD-10-CM

## 2014-04-09 DIAGNOSIS — S78119A Complete traumatic amputation at level between unspecified hip and knee, initial encounter: Secondary | ICD-10-CM

## 2014-04-09 NOTE — Progress Notes (Signed)
Subjective:    Patient ID: [name], male    DOB: Mar 24, 1935, 79 y.o.   MRN: [id number]  HPI   79 year-old patient with past medical history significant for diabetes mellitus, CHF, atrial fibrillation on chronic anticoagulation, syncope with tachybradycardia syndrome  status post pacemaker placement who was referred to [place] by Dr. Arnoldo Morale and Dr. 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  He stated he's had a wound on his heel for approximately half a year but is worsened in the last 6-8 weeks. He's been followed closely by Dr. Dellia Nims in the wound care clinic his prescribed him doxycycline and more recently ciprofloxacin after recovering Pseudomonas from a deep tissue culture.  Pseudomonas was sensitive to all antibiotics tested including ciprofloxacin and levofloxacin with MIC the left less than equal 0.25 and 0.5 also sensitive to Zosyn imipenem Ceftaz and cefepime gentamicin and tobramycin.  Patient had undergone a CT scan of the heel which showed:  Osteolysis and active osteomyelitis of the calcaneus.  He states that ciprofloxacin is improved his wound but the wound is now getting bigger after debridement from wound care and treatment with the hyperbaric chamber.   When I first saw him I had cautioned that he would need BKA to cure his chronic deep bone infection but that we could pursue bone biopsy followed by targetted IV abx followed by indefinite oral abx. I had referred him to Orthopedics but he ended up being admitted to [facility] for worsening infection, foul odor erythema from around the wound.   Dr. Migdalia Dk took him to the OR and did a bone biopsy for culture which yielded abundant GBS.   The following day however he decided he had better proceed with curative BKA which Dr. Sharol Given performed. She was seen by my partners in ID and abx were stopped within 72 hours post surgery.  He has been dc to [facility]. Unfortunately he   developed a soft  tissue infection involving his stump with purulence at the site and Dr Sharol Given started him on doxy--   I saw him cultured the area of purulence and added keflex and he grew a Klebsiella PNA that was S to this abx.  Since then the would has worsening dehiscence and purulence. He saw Dr. Sharol Given  Who took him to the OR for I and D of stump infection on  02/14/14. Marland Kitchen He has continued on doxy and keflex but then bled from the stump and was admitted to the hospital sp transfusion and re-exam by Orthopedics. He then underwent yet another revision of stump on 03/18/14. Unfortunately he had progressive gangrenous changes of this site and required admission for AKA by Dr. Sharol Given on 04/02/14. He has continued on keflex and doxy postoperatively though he should now finally be cured at this site. No fevers, chills or malaise.    Review of Systems  Constitutional: Negative for fever, chills, diaphoresis, appetite change and fatigue.  Eyes: Negative for photophobia and visual disturbance.  Respiratory: Negative for cough, chest tightness, shortness of breath and wheezing.   Cardiovascular: Negative for chest pain and palpitations.  Gastrointestinal: Negative for nausea, vomiting and diarrhea.  Musculoskeletal: Positive for arthralgias. Negative for back pain and myalgias.  Skin: Positive for wound. Negative for color change, pallor and rash.  Neurological: Negative for dizziness, tremors, weakness and light-headedness.  Hematological: Bruises/bleeds easily.  Psychiatric/Behavioral: Negative for behavioral problems, confusion, sleep disturbance, dysphoric mood, decreased concentration and agitation.       Objective:  Physical Exam  Constitutional: He is oriented to person, place, and time. He appears well-developed and well-nourished. No distress.  HENT:  Head: Normocephalic and atraumatic.  Mouth/Throat: Oropharynx is clear and moist. No oropharyngeal exudate.  Eyes: Conjunctivae and EOM are normal. No scleral  icterus.  Neck: Normal range of motion. Neck supple.  Cardiovascular: Normal rate and normal heart sounds.  An irregularly irregular rhythm present. Exam reveals no friction rub.   No murmur heard. Pulmonary/Chest: Effort normal and breath sounds normal. No respiratory distress. He has no wheezes. He has no rales. He exhibits no tenderness.    Abdominal: Soft. Bowel sounds are normal. He exhibits no distension. There is no tenderness. There is no rebound.  Musculoskeletal: He exhibits no edema and no tenderness.       Legs: Neurological: He is alert and oriented to person, place, and time. He exhibits normal muscle tone. Coordination normal.  Skin: Skin is warm and dry. He is not diaphoretic. There is erythema. No pallor.  Psychiatric: He has a normal mood and affect. His behavior is normal. Judgment and thought content normal.   Right AKA site: looks to be healing nicely!                Assessment & Plan:   Stump infection with gangrenous changes sp AKA  --continue keflex thru 04/16/14 and then DC it --there is really no data to support antibiotics beyond 48 hours postop curative amputation and we are likely largely "treating ourselves" with these antibiotics but I want to be very careful with [name] --rtc in one month --follow closely with Dr. Sharol Given  I spent greater than 25 minutes with the patient including greater than 50% of time in face to face counsel of the patient and in coordination of their care.   Diabetic osteomyelitis of the calcaneus: cured with BKA, GBS was culprit ID on deep culture   Left leg with erythematous area: area is concerning for embolic lesion ? Due to PVD vs endocarditis? He had normal ABI's in January.  I discussed with Cardiology and they  TTE now done and without vegetations  Will need to follow this site closely

## 2014-04-09 NOTE — Progress Notes (Signed)
Patient ID: [name], male   DOB: 05-29-1935, 79 y.o.   MRN: [id number] Patient with anemia of chronic disease. Patient presented with anemia and was transfused 2 units of packed red blood cells.

## 2014-04-30 ENCOUNTER — Encounter: Payer: Self-pay | Admitting: *Deleted

## 2014-05-01 ENCOUNTER — Other Ambulatory Visit: Payer: Self-pay | Admitting: Dermatology

## 2014-05-06 ENCOUNTER — Telehealth: Payer: Self-pay | Admitting: *Deleted

## 2014-05-06 NOTE — Telephone Encounter (Signed)
Left message for patient to call and schedule PPM check with Dr. Caryl Comes.

## 2014-05-08 ENCOUNTER — Non-Acute Institutional Stay (SKILLED_NURSING_FACILITY): Payer: Medicare Other | Admitting: Internal Medicine

## 2014-05-08 DIAGNOSIS — I4891 Unspecified atrial fibrillation: Secondary | ICD-10-CM

## 2014-05-08 DIAGNOSIS — I739 Peripheral vascular disease, unspecified: Secondary | ICD-10-CM

## 2014-05-08 DIAGNOSIS — I482 Chronic atrial fibrillation, unspecified: Secondary | ICD-10-CM

## 2014-05-08 DIAGNOSIS — E1159 Type 2 diabetes mellitus with other circulatory complications: Secondary | ICD-10-CM

## 2014-05-08 DIAGNOSIS — D62 Acute posthemorrhagic anemia: Secondary | ICD-10-CM

## 2014-05-08 NOTE — Progress Notes (Signed)
Patient ID: [name], male   DOB: 1935-11-17, 79 y.o.   MRN: [id number]    Facility: [facility]  Code status- DNR  Chief Complaint  Patient presents with  . Medical Management of Chronic Issues   Allergies  Allergen Reactions  . Morphine Rash and Other (See Comments)    Irritability also   HPI 79 year old male patient is here for rehabilitation. He is seen today for routine follow up. He has history of diabetes with vascular complications, chronic low back pain, TIA, esophageal stricture among others.    Had biopsy of lesion on head by dermatology last week and result pending Has seen dr Sharol Given- note reviewed Wound healing well  No further bleed reported Pain in leg under control occassional back pain Reviewed blood sugar reading. His cbg 115- 381  Review of Systems  Constitutional: Negative for malaise/fatigue.  Eyes: Negative for blurred vision.Wears glasses  Respiratory: Negative for shortness of breath.   Cardiovascular: Negative for chest pain and leg swelling.  Gastrointestinal: Negative for abdominal pain, constipation, blood in stool and melena.  Genitourinary: Negative for dysuria.  Musculoskeletal: Negative for falls.Leg pain controlled with medications  Skin: Negative for rash.  Neurological: Negative for dizziness, weakness and headaches.   Past Medical History  Diagnosis Date  . Fatigue   . Overweight(278.02)   . Syncope and collapse   . GERD (gastroesophageal reflux disease)   . Right bundle branch block   . Unspecified venous (peripheral) insufficiency   . Phlebitis and thrombophlebitis of superficial vessels of lower extremities   . Cellulitis and abscess of leg, except foot   . Backache, unspecified   . Long term (current) use of anticoagulants   . Type II or unspecified type diabetes mellitus without mention of complication, not stated as uncontrolled   . Unspecified venous (peripheral) insufficiency   . Hypertension   .  Gout, unspecified   . Heart failure   . Atrial fibrillation     permanent  . Esophageal stricture   . Hx of colonic polyps 05-2006    (Adenomatous)Dr. [name]  . Diverticulosis 05-2006    Dr. [name]   . Tachycardia-bradycardia     s/p PPM  . Pacemaker- [name] 02/20/2013  . CHF (congestive heart failure)    Current Outpatient Prescriptions on File Prior to Visit  Medication Sig Dispense Refill  . apixaban (ELIQUIS) 5 MG TABS tablet Take 5 mg by mouth daily.      . digoxin (LANOXIN) 0.125 MG tablet Take 0.125 mg by mouth daily.      Marland Kitchen diltiazem (CARDIZEM CD) 180 MG 24 hr capsule Take 1 capsule (180 mg total) by mouth daily.  30 capsule  6  . ferrous fumarate (HEMOCYTE - 106 MG FE) 325 (106 FE) MG TABS tablet Take 1 tablet (106 mg of iron total) by mouth daily at 12 noon.  30 each  0  . furosemide (LASIX) 40 MG tablet Take 40 mg by mouth daily.       Marland Kitchen gabapentin (NEURONTIN) 100 MG capsule Take 1 capsule (100 mg total) by mouth every 12 (twelve) hours.  90 capsule  3  . insulin aspart (NOVOLOG FLEXPEN) 100 UNIT/ML FlexPen Inject 0-10 Units into the skin 3 (three) times daily with meals. Per sliding scale:  cbg 150-250 5 units, 251-300 8 units, 301-500 10 units (call MD if over 350)      . Insulin Glargine (LANTUS SOLOSTAR) 100 UNIT/ML Solostar Pen Inject 20 Units  into the skin at bedtime.  15 mL  11  . lisinopril (PRINIVIL,ZESTRIL) 10 MG tablet Take 10 mg by mouth daily.      Marland Kitchen LORazepam (ATIVAN) 0.5 MG tablet Take 0.5 tablets (0.25 mg total) by mouth 2 (two) times daily as needed for anxiety.  30 tablet  5  . Melatonin 5 MG TABS Take 5 mg by mouth at bedtime as needed (insomnia).      . methocarbamol (ROBAXIN) 500 MG tablet Take 1 tablet (500 mg total) by mouth 3 (three) times daily.  30 tablet  0  . Multiple Vitamins-Minerals (DECUBI-VITE) CAPS Take 1 capsule by mouth daily.       Marland Kitchen omeprazole (PRILOSEC) 20 MG capsule Take 20 mg by mouth 2 (two) times daily before a meal.      .  senna-docusate (SENNA-S) 8.6-50 MG per tablet Take 1 tablet by mouth daily.       No current facility-administered medications on file prior to visit.   Physical exam BP 112/62  Pulse 70  Temp(Src) 97.7 F (36.5 C)  Resp 18  Ht 5\' 10"  (1.778 m)  Wt 159 lb (72.122 kg)  BMI 22.81 kg/m2  Constitutional: He is oriented to person, place, and time. He appears well-developed and well-nourished. No distress.  HENT:   Head: Normocephalic and atraumatic.  Cardiovascular: irregular heart rate Pulmonary/Chest: Effort normal and breath sounds normal. No respiratory distress.  Abdominal: Soft. Bowel sounds are normal. He exhibits no distension and no mass. There is no tenderness.  Musculoskeletal: Normal range of motion. Right AKA Neurological: He is alert and oriented to person, place and time.  Skin: dressing in right AKA clean and dry Psychiatric: He has a normal mood and affect.   Labs- 04/16/14 Wbc 7.7, hb 8.7, hct 26, na 133, k 4.7, glu 146, bun 35, cr 1.38, ca 8.9  Lab Results  Component Value Date   WBC 7.4 04/05/2014   HGB 8.3* 04/05/2014   HCT 25.4* 04/05/2014   MCV 86.1 04/05/2014   PLT 183 04/05/2014   Lab Results  Component Value Date   HGBA1C 5.9* 03/09/2014   Assessment/plan  DM with PVD With a1c of 5.9, will decrease his lantus to 10 u, continue sliding scale insulin and monitor cbg, recheck a1c tomorrow and if > 7, will adjust lantus dosing further  afib Rate controlled. Continue digoxin and diltiazem current regimen with apixaban  PAD S/p amputation on right side, getting wound care and has follow up with dr duda. Continue oxy-apap for pain and robaxin for muscle spasm. Continue to work with therapy team  Anemia With his blood loss. Recheck h&h and continue ferrous sulfate for now  Labs- a1c, h&h next draw

## 2014-05-12 LAB — CBC AND DIFFERENTIAL
Platelets: 198 10*3/uL (ref 150–399)
WBC: 7.4 10^3/mL

## 2014-05-12 LAB — HEMOGLOBIN A1C: Hgb A1c MFr Bld: 6.6 % — AB (ref 4.0–6.0)

## 2014-05-15 ENCOUNTER — Ambulatory Visit: Payer: Medicare Other | Admitting: Podiatry

## 2014-05-19 ENCOUNTER — Ambulatory Visit (INDEPENDENT_AMBULATORY_CARE_PROVIDER_SITE_OTHER): Payer: Medicare Other | Admitting: Infectious Disease

## 2014-05-19 ENCOUNTER — Encounter: Payer: Self-pay | Admitting: Infectious Disease

## 2014-05-19 VITALS — BP 148/64 | HR 65 | Temp 98.7°F

## 2014-05-19 DIAGNOSIS — E11621 Type 2 diabetes mellitus with foot ulcer: Secondary | ICD-10-CM

## 2014-05-19 DIAGNOSIS — T879 Unspecified complications of amputation stump: Secondary | ICD-10-CM

## 2014-05-19 DIAGNOSIS — B951 Streptococcus, group B, as the cause of diseases classified elsewhere: Secondary | ICD-10-CM

## 2014-05-19 DIAGNOSIS — L97509 Non-pressure chronic ulcer of other part of unspecified foot with unspecified severity: Secondary | ICD-10-CM

## 2014-05-19 DIAGNOSIS — M869 Osteomyelitis, unspecified: Secondary | ICD-10-CM

## 2014-05-19 DIAGNOSIS — M9080 Osteopathy in diseases classified elsewhere, unspecified site: Secondary | ICD-10-CM

## 2014-05-19 DIAGNOSIS — E1159 Type 2 diabetes mellitus with other circulatory complications: Secondary | ICD-10-CM

## 2014-05-19 DIAGNOSIS — E1169 Type 2 diabetes mellitus with other specified complication: Secondary | ICD-10-CM

## 2014-05-19 DIAGNOSIS — A491 Streptococcal infection, unspecified site: Secondary | ICD-10-CM

## 2014-05-19 MED ORDER — CEPHALEXIN 500 MG PO CAPS: 500.0000 mg | ORAL_CAPSULE | Freq: Two times a day (BID) | ORAL | Status: DC

## 2014-05-19 NOTE — Progress Notes (Signed)
Subjective:    Patient ID: [name], male    DOB: November 25, 1934, 79 y.o.   MRN: [id number]  HPI   79 year-old patient with past medical history significant for diabetes mellitus, CHF, atrial fibrillation on chronic anticoagulation, syncope with tachybradycardia syndrome  status post pacemaker placement who was referred to [place] by Dr. Arnoldo Morale and Dr. 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  He stated he's had a wound on his heel for approximately half a year but is worsened in the last 6-8 weeks. He's been followed closely by Dr. Dellia Nims in the wound care clinic his prescribed him doxycycline and more recently ciprofloxacin after recovering Pseudomonas from a deep tissue culture.  Pseudomonas was sensitive to all antibiotics tested including ciprofloxacin and levofloxacin with MIC the left less than equal 0.25 and 0.5 also sensitive to Zosyn imipenem Ceftaz and cefepime gentamicin and tobramycin.  Patient had undergone a CT scan of the heel which showed:  Osteolysis and active osteomyelitis of the calcaneus.  He states that ciprofloxacin is improved his wound but the wound is now getting bigger after debridement from wound care and treatment with the hyperbaric chamber.   When I first saw him I had cautioned that he would need BKA to cure his chronic deep bone infection but that we could pursue bone biopsy followed by targetted IV abx followed by indefinite oral abx. I had referred him to Orthopedics but he ended up being admitted to [facility] for worsening infection, foul odor erythema from around the wound.   Dr. Migdalia Dk took him to the OR and did a bone biopsy for culture which yielded abundant GBS.   The following day however he decided he had better proceed with curative BKA which Dr. Sharol Given performed. She was seen by my partners in ID and abx were stopped within 72 hours post surgery.  He has been dc to [facility]. Unfortunately he   developed a soft  tissue infection involving his stump with purulence at the site and Dr Sharol Given started him on doxy--   I saw him cultured the area of purulence and added keflex and he grew a Klebsiella PNA that was S to this abx.  Since then the would has worsening dehiscence and purulence. He saw Dr. Sharol Given  Who took him to the OR for I and D of stump infection on  02/14/14. Marland Kitchen He has continued on doxy and keflex but then bled from the stump and was admitted to the hospital sp transfusion and re-exam by Orthopedics. He then underwent yet another revision of stump on 03/18/14. Unfortunately he had progressive gangrenous changes of this site and required admission for AKA by Dr. Sharol Given on 04/02/14. He has continued on keflex and doxy postoperatively though we stopped a few weeks ago.  He has continued to have drainage from this site but the output has gone down.  Review of Systems  Constitutional: Negative for fever, chills, diaphoresis, appetite change and fatigue.  Eyes: Negative for photophobia and visual disturbance.  Respiratory: Negative for cough, chest tightness, shortness of breath and wheezing.   Cardiovascular: Negative for chest pain and palpitations.  Gastrointestinal: Negative for nausea, vomiting and diarrhea.  Musculoskeletal: Negative for back pain and myalgias.  Skin: Positive for wound. Negative for color change, pallor and rash.  Neurological: Negative for dizziness, tremors, weakness and light-headedness.  Hematological: Bruises/bleeds easily.  Psychiatric/Behavioral: Negative for behavioral problems, confusion, sleep disturbance, dysphoric mood, decreased concentration and agitation.  Objective:   Physical Exam  Constitutional: He is oriented to person, place, and time. He appears well-developed and well-nourished. No distress.  HENT:  Head: Normocephalic and atraumatic.  Mouth/Throat: Oropharynx is clear and moist. No oropharyngeal exudate.  Eyes: Conjunctivae and EOM are normal. No  scleral icterus.  Neck: Normal range of motion. Neck supple.  Cardiovascular: Normal rate and normal heart sounds.  An irregularly irregular rhythm present. Exam reveals no friction rub.   No murmur heard. Pulmonary/Chest: Effort normal and breath sounds normal. No respiratory distress. He has no wheezes. He has no rales. He exhibits no tenderness.  Abdominal: Soft. Bowel sounds are normal. He exhibits no distension. There is no tenderness. There is no rebound.  Musculoskeletal: He exhibits no edema and no tenderness.       Legs: Neurological: He is alert and oriented to person, place, and time. He exhibits normal muscle tone. Coordination normal.  Skin: Skin is warm and dry. He is not diaphoretic. There is erythema. No pallor.  Psychiatric: He has a normal mood and affect. His behavior is normal. Judgment and thought content normal.   Right AKA site: has improved but still with drainage and fungation from middle of wound  Today's visit:      Last visit             Assessment & Plan:   Stump infection with gangrenous changes sp AKA now with fungating area in middle of wound and drainage  --restart keflex and rtc in one month --follow closely with Dr. Sharol Given  I spent greater than 25 minutes with the patient including greater than 50% of time in face to face counsel of the patient and in coordination of their care.   Diabetic osteomyelitis of the calcaneus: cured with BKA, GBS was culprit ID on deep culture   Left leg with erythematous area: area is concerning for embolic lesion ? Due to PVD vs endocarditis? He had normal ABI's in January.  I discussed with Cardiology and they  TTE now done and without vegetations

## 2014-05-29 ENCOUNTER — Other Ambulatory Visit: Payer: Self-pay | Admitting: *Deleted

## 2014-05-29 ENCOUNTER — Encounter: Payer: Self-pay | Admitting: Radiation Oncology

## 2014-05-29 ENCOUNTER — Ambulatory Visit
Admission: RE | Admit: 2014-05-29 | Discharge: 2014-05-29 | Disposition: A | Discharge status: Home / Self Care | Payer: Medicare Other | Source: Ambulatory Visit | Attending: Radiation Oncology | Admitting: Radiation Oncology

## 2014-05-29 ENCOUNTER — Telehealth: Payer: Self-pay | Admitting: *Deleted

## 2014-05-29 VITALS — BP 126/69 | HR 85 | Temp 97.5°F | Resp 20 | Ht 70.0 in | Wt 161.0 lb

## 2014-05-29 DIAGNOSIS — I4510 Unspecified right bundle-branch block: Secondary | ICD-10-CM | POA: Insufficient documentation

## 2014-05-29 DIAGNOSIS — R5381 Other malaise: Secondary | ICD-10-CM | POA: Diagnosis not present

## 2014-05-29 DIAGNOSIS — I872 Venous insufficiency (chronic) (peripheral): Secondary | ICD-10-CM | POA: Insufficient documentation

## 2014-05-29 DIAGNOSIS — E663 Overweight: Secondary | ICD-10-CM | POA: Diagnosis not present

## 2014-05-29 DIAGNOSIS — S78119A Complete traumatic amputation at level between unspecified hip and knee, initial encounter: Secondary | ICD-10-CM | POA: Diagnosis not present

## 2014-05-29 DIAGNOSIS — Z7901 Long term (current) use of anticoagulants: Secondary | ICD-10-CM | POA: Diagnosis not present

## 2014-05-29 DIAGNOSIS — E119 Type 2 diabetes mellitus without complications: Secondary | ICD-10-CM | POA: Diagnosis not present

## 2014-05-29 DIAGNOSIS — Z794 Long term (current) use of insulin: Secondary | ICD-10-CM | POA: Diagnosis not present

## 2014-05-29 DIAGNOSIS — R5383 Other fatigue: Secondary | ICD-10-CM | POA: Diagnosis not present

## 2014-05-29 DIAGNOSIS — C4442 Squamous cell carcinoma of skin of scalp and neck: Secondary | ICD-10-CM

## 2014-05-29 DIAGNOSIS — I4891 Unspecified atrial fibrillation: Secondary | ICD-10-CM | POA: Insufficient documentation

## 2014-05-29 DIAGNOSIS — R51 Headache: Secondary | ICD-10-CM | POA: Insufficient documentation

## 2014-05-29 DIAGNOSIS — Z860100 Personal history of colon polyps, unspecified: Secondary | ICD-10-CM | POA: Insufficient documentation

## 2014-05-29 DIAGNOSIS — Z510 Encounter for antineoplastic radiation therapy: Secondary | ICD-10-CM | POA: Diagnosis not present

## 2014-05-29 DIAGNOSIS — M109 Gout, unspecified: Secondary | ICD-10-CM | POA: Insufficient documentation

## 2014-05-29 DIAGNOSIS — Z950 Presence of cardiac pacemaker: Secondary | ICD-10-CM | POA: Diagnosis not present

## 2014-05-29 DIAGNOSIS — K219 Gastro-esophageal reflux disease without esophagitis: Secondary | ICD-10-CM | POA: Diagnosis not present

## 2014-05-29 DIAGNOSIS — I509 Heart failure, unspecified: Secondary | ICD-10-CM | POA: Diagnosis not present

## 2014-05-29 DIAGNOSIS — L299 Pruritus, unspecified: Secondary | ICD-10-CM | POA: Diagnosis not present

## 2014-05-29 DIAGNOSIS — Z79899 Other long term (current) drug therapy: Secondary | ICD-10-CM | POA: Insufficient documentation

## 2014-05-29 DIAGNOSIS — D0439 Carcinoma in situ of skin of other parts of face: Secondary | ICD-10-CM | POA: Insufficient documentation

## 2014-05-29 DIAGNOSIS — Z85828 Personal history of other malignant neoplasm of skin: Secondary | ICD-10-CM | POA: Insufficient documentation

## 2014-05-29 DIAGNOSIS — K222 Esophageal obstruction: Secondary | ICD-10-CM | POA: Diagnosis not present

## 2014-05-29 DIAGNOSIS — D0430 Carcinoma in situ of skin of unspecified part of face: Secondary | ICD-10-CM | POA: Diagnosis not present

## 2014-05-29 MED ORDER — OXYCODONE-ACETAMINOPHEN 10-325 MG PO TABS: ORAL_TABLET | ORAL | Status: DC

## 2014-05-29 NOTE — Telephone Encounter (Signed)
[facility] 

## 2014-05-29 NOTE — Progress Notes (Signed)
[facility] NEW PATIENT EVALUATION  Name: [name] MRN: [id number]  Date:   05/29/2014           DOB: [birth year]  Status: outpatient   CC: Georgetta Haber, MD  Selinda Orion, MD , Dr. Virl Axe   REFERRING PHYSICIAN: Ubaldo Glassing Barnabas Lister, MD   DIAGNOSIS: Multiple squamous cell carcinomas, , both invasive and noninvasive, involving the scalp and left forehead  HISTORY OF PRESENT ILLNESS:  [name] is a 79 y.o. male who is seen today through the courtesy of Dr. Rolm Bookbinder for consideration of electron beam radiation therapy in the management of his squamous cell carcinomas involving the scalp and left forehead. The patient has had multiple treatments over the past few years for both invasive squamous cell carcinoma and squamous cell carcinoma site to along the scalp, and face. 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 He does have a left anterior cardiac pacemaker ( [employer]). 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  PREVIOUS RADIATION THERAPY: No   PAST MEDICAL HISTORY:  has a past medical history of Fatigue; Overweight(278.02); Syncope and collapse; GERD (gastroesophageal reflux disease); Right bundle branch block; Unspecified venous (peripheral) insufficiency; Phlebitis and  thrombophlebitis of superficial vessels of lower extremities; Cellulitis and abscess of leg, except foot; Backache, unspecified; Long term (current) use of anticoagulants; Type II or unspecified type diabetes mellitus without mention of complication, not stated as uncontrolled; Unspecified venous (peripheral) insufficiency; Hypertension; Gout, unspecified; Heart failure; Atrial fibrillation; Esophageal stricture; colonic polyps (05-2006); Diverticulosis (05-2006); Tachycardia-bradycardia; Pacemaker- [place] (02/20/2013); CHF (congestive heart failure); and Skin cancer.     PAST SURGICAL HISTORY:  Past Surgical History  Procedure Laterality Date  . Laminectomy    . Lumbar fusion    . Rotator cuff repair    . Ruptured rt rectus muscle    . Eye surgery    . Pacemaker insertion  8/12    SJM by [name] for tachy/brady syndrome  . I&d extremity Right 01/17/2014    Procedure: IRRIGATION AND DEBRIDEMENT RIGHT HEEL  WITH CULTURES AND BONE BIOPSY, placement of wound vac;  Surgeon: Theodoro Kos, DO;  Location: [place];  Service: Plastics;  Laterality: Right;  . Amputation Right 01/20/2014    Procedure: AMPUTATION BELOW KNEE- right;  Surgeon: [name], MD;  Location: [place];  Service: Orthopedics;  Laterality: Right;  Right Below Knee Amputation  . Amputation Right 02/14/2014    Procedure: AMPUTATION BELOW KNEE;  Surgeon: [name], MD;  Location: [place];  Service: Orthopedics;  Laterality: Right;  Right Below Knee Amputation Revision  . Amputation Right 03/07/2014    Procedure: AMPUTATION BELOW KNEE;  Surgeon: [name], MD;  Location: [place];  Service: Orthopedics;  Laterality: Right;  Right Below Knee Amputation Revision  . Amputation Right 03/18/2014    Procedure: revision of  AMPUTATION BELOW KNEE;  Surgeon: [name], MD;  Location: [place];  Service: Orthopedics;  Laterality: Right;  Revision Right Below Knee Amputation  . Above knee leg amputation Right 04/02/2014    DR DUDA  . Amputation  Right 04/02/2014    Procedure: AMPUTATION ABOVE KNEE;  Surgeon: [name], MD;  Location: [place];  Service: Orthopedics;  Laterality: Right;  Right Above Knee Amputation  . Skin biopsy      scalp     FAMILY HISTORY: family history includes Diabetes in his father; Heart disease in his father; Pneumonia in his mother. There is no history of Colon cancer. His father died of cardiac disease is 41. His mother also died from "old age/cardiac disease it 105.   SOCIAL HISTORY:  reports that he has never smoked. He has never used smokeless tobacco. He reports that he does not drink alcohol or use illicit drugs. Married, 2 children. 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   ALLERGIES: Morphine   MEDICATIONS:  Current Outpatient Prescriptions  Medication Sig Dispense Refill  . ACCU-CHEK SMARTVIEW test strip       . apixaban (ELIQUIS) 5 MG TABS tablet Take 5 mg by mouth daily.      . ASSURE COMFORT LANCETS 30G MISC       . Blood Glucose Calibration (ACCU-CHEK SMARTVIEW CONTROL) LIQD       . cephALEXin (KEFLEX) 500 MG capsule Take 1 capsule (500 mg total) by mouth 2 (two) times daily.  60 capsule  1  . digoxin (LANOXIN) 0.125 MG tablet Take 0.125 mg by mouth daily.      Marland Kitchen diltiazem (CARDIZEM CD) 180 MG 24 hr capsule Take 1 capsule (180 mg total) by mouth daily.  30 capsule  6  . ferrous fumarate (HEMOCYTE - 106 MG FE) 325 (106 FE) MG TABS tablet Take 1 tablet (106 mg of iron total) by mouth daily at 12 noon.  30 each  0  . furosemide (LASIX) 40 MG tablet Take 40 mg by mouth daily.       Marland Kitchen gabapentin (NEURONTIN) 100 MG capsule Take 1 capsule (100 mg total) by mouth every 12 (twelve) hours.  90 capsule  3  . insulin aspart (NOVOLOG FLEXPEN) 100 UNIT/ML FlexPen Inject 0-10 Units into the skin 3 (three) times daily with meals. Per sliding scale:  cbg 150-250 5 units, 251-300 8 units, 301-500 10 units (call MD if over 350)      . Insulin Glargine (LANTUS SOLOSTAR) 100 UNIT/ML Solostar Pen Inject 20  Units into the skin at bedtime.  15 mL  11  . Insulin Syringe-Needle U-100 (INSULIN SYRINGE .3CC/31GX5/16") 31G X 5/16" 0.3 ML MISC 0.3 mL 31 x 5/16"      . lisinopril (PRINIVIL,ZESTRIL) 10 MG tablet Take 10 mg by mouth daily.      Marland Kitchen LORazepam (ATIVAN) 0.5 MG tablet Take 0.5 tablets (0.25 mg total) by mouth 2 (two) times daily as needed for anxiety.  30 tablet  5  . Melatonin 5 MG TABS Take 5 mg by mouth at bedtime as needed (insomnia).      . methocarbamol (ROBAXIN) 500 MG tablet Take 1 tablet (500 mg total) by mouth 3 (three) times daily.  30 tablet  0  . Multiple Vitamins-Minerals (DECUBI-VITE) CAPS Take 1 capsule by mouth daily.       . mupirocin ointment (BACTROBAN) 2 % Place 1 application into the nose daily. To biopsy site on scalp until healed      . Nutritional Supplements (FEEDING SUPPLEMENT, GLUCERNA 1.2 CAL,) LIQD  Place 1,000 mLs into feeding tube continuous.      Marland Kitchen omeprazole (PRILOSEC) 20 MG capsule Take 20 mg by mouth 2 (two) times daily before a meal.      . oxyCODONE-acetaminophen (PERCOCET) 10-325 MG per tablet Take 1 tablet by mouth every 6 (six) hours.      . polyethylene glycol (MIRALAX / GLYCOLAX) packet Take 17 g by mouth daily as needed.      . saccharomyces boulardii (FLORASTOR) 250 MG capsule Take 250 mg by mouth 2 (two) times daily.      Marland Kitchen senna-docusate (SENNA-S) 8.6-50 MG per tablet Take 1 tablet by mouth daily.      . fluoruracil (CARAC) 0.5 % cream       . NEOMYCIN-POLYMYXIN-DEXAMETH OP 3.5-10,000-0.1 mg-unit/g-%       No current facility-administered medications for this encounter.     REVIEW OF SYSTEMS:  Pertinent items are noted in HPI.    PHYSICAL EXAM:  height is 5\' 10"  (1.778 m) and weight is 161 lb (73.029 kg). His oral temperature is 97.5 F (36.4 C). His blood pressure is 126/69 and his pulse is 85. His respiration is 20.  On inspection of the scalp there are numerous keratotic lesions which are noted and correlated with the excellent photograph  supplied by Dr. Ubaldo Glassing. Along the periphery, and also beneath remaining hair are scattered small actinic lesions. There is no palpable periaricular, facial, or neck adenopathy.   LABORATORY DATA:  Lab Results  Component Value Date   WBC 7.4 04/05/2014   HGB 8.3* 04/05/2014   HCT 25.4* 04/05/2014   MCV 86.1 04/05/2014   PLT 183 04/05/2014   Lab Results  Component Value Date   NA 138 04/02/2014   K 4.9 04/02/2014   CL 100 04/02/2014   CO2 26 04/02/2014   Lab Results  Component Value Date   ALT 9 03/16/2014   AST 12 03/16/2014   ALKPHOS 98 03/16/2014   BILITOT 0.4 03/16/2014      IMPRESSION: Squamous cell carcinomas, both invasive and noninvasive involving his scalp and left upper forehead. He is a candidate for fractionated electron beam radiation therapy over a period of 6 weeks. I intend to cover all of his lesions along the scalp and upper left forehead. I will not address/treat his biopsy-proven squamous cell carcinoma along his right upper chest/clavicular region unless requested by Dr. Ubaldo Glassing. This separate lesion could be treated with 2 weeks of electron beam radiation at any point in time. I discussed the potential acute and late toxicities of radiation therapy which should be well tolerated. Consent is signed today. I will have him return for simulation/treatment planning next week. His pacemaker should not pose a problem, but we will obtain cardiac clearance from his cardiologist per our protocol., Dr. Virl Axe. His prognosis is favorable.   PLAN: As discussed above.  I spent 45  minutes face to face with the patient and more than 50% of that time was spent in counseling and/or coordination of care.

## 2014-05-29 NOTE — Progress Notes (Signed)
Histology and Location of Primary Skin Cancer: scalp  [name] presented with the following signs/symptoms, mild to moderate crusty scaly lesions on scalp x 2 months  Past/Anticipated interventions by patient's surgeon/dermatologist for current problematic lesion, if any: electrodesiccation and curettage and shave biopsy  Past skin cancers, if any:  1) Location/Histology/Intervention: basal cell carcinoma  2) Location/Histology/Intervention: squamous cell carcinoma  3) Location/Histology/Intervention:   History of Blistering sunburns, if any: Patient has a history of significant sun exposure  SAFETY ISSUES:  Prior radiation? no  Pacemaker/ICD? yes  Possible current pregnancy? no  Is the patient on methotrexate? no  Current Complaints / other details:  79 year old male. AX: morphine PATIENT HAS A PACEMAKER

## 2014-05-29 NOTE — Addendum Note (Signed)
Encounter addended by: Andria Rhein, RN on: 05/29/2014 11:18 AM<BR>     Documentation filed: Charges VN, Visit Diagnoses

## 2014-05-29 NOTE — Progress Notes (Signed)
See progress note under physician encounter. 

## 2014-05-29 NOTE — Progress Notes (Signed)
Histology and Location of Primary Skin Cancer: scalp   [name] presented with the following signs/symptoms, mild to moderate crusty scaly lesions on scalp x 2 months , history of multiple Moh's surgeries, multiple skin cancers removed, excised by various methods Past/Anticipated interventions by patient's surgeon/dermatologist for current problematic lesion, if any: electrodesiccation and curettage and shave biopsy   05/01/14 Diagnosis 1. Skin Biopsy-(P), (A) right chest, shave and ED&C MODERATELY DIFFERENTIATED SQUAMOUS CELL CARCINOMA , CRUSTED 2. Skin Biopsy-(P), (B) mid posterior parietal scalp, shave WELL DIFFERENTIATED SQUAMOUS CELL CARCINOMA 3. Skin Biopsy-(P), (C) mid parietal scalp, shave SQUAMOUS CELL CARCINOMA IN SITU, BASE INVOLVED , CRUSTED 4. Skin Biopsy-(P), (D) mid anterior parietal scalp, shave WELL DIFFERENTIATED SQUAMOUS CELL CARCINOMA 5. Skin Biopsy-(P), (E) mid frontal scalp, shave SQUAMOUS CELL CARCINOMA IN SITU, BASE INVOLVED , ULCERATED 6. Skin Biopsy-(P), (F) right frontal scalp, shave SQUAMOUS CELL CARCINOMA IN SITU , ULCERATED 7. 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 no  Pacemaker/ICD? yes  Possible current pregnancy? na Is the patient on methotrexate? No  Current Complaints / other details: 79 year old male. AX: morphine PATIENT HAS A PACEMAKER, married, was a Hotel manager

## 2014-05-29 NOTE — Telephone Encounter (Signed)
[place] driver=Philander, informed him patient is ready to be picked up from [facility] [employer] inside lobby, "Darrell Jewel said he would be here shortly",escorted patient via w/c to the lobby 10:16 AM

## 2014-06-02 ENCOUNTER — Ambulatory Visit
Admission: RE | Admit: 2014-06-02 | Discharge: 2014-06-02 | Disposition: A | Discharge status: Home / Self Care | Payer: Medicare Other | Source: Ambulatory Visit | Attending: Radiation Oncology | Admitting: Radiation Oncology

## 2014-06-02 DIAGNOSIS — Z510 Encounter for antineoplastic radiation therapy: Secondary | ICD-10-CM | POA: Diagnosis not present

## 2014-06-02 DIAGNOSIS — C4442 Squamous cell carcinoma of skin of scalp and neck: Secondary | ICD-10-CM

## 2014-06-02 NOTE — Progress Notes (Signed)
Complex simulation/treatment planning note: The patient was taken to the CT simulator. Guided by his outside photograph, I marked his electron beam field on his scalp. I was generous along the periphery where he had actinic keratoses or occult squamous cell carcinomas. A head cast was constructed with his chin flexed. 0.8 cm custom bolus was placed on the skin to increase the dose to the skin surface for treatment planning ([place]). I am prescribing 6000 cGy in 30 sessions utilizing 6 MEV electrons. We'll begin his treatment his scalp he'll undergo a separate simulation to his left upper forehead where he has carcinoma in situ.

## 2014-06-03 ENCOUNTER — Non-Acute Institutional Stay (SKILLED_NURSING_FACILITY): Payer: Medicare Other | Admitting: Internal Medicine

## 2014-06-03 ENCOUNTER — Encounter: Payer: Self-pay | Admitting: Internal Medicine

## 2014-06-03 DIAGNOSIS — E1159 Type 2 diabetes mellitus with other circulatory complications: Secondary | ICD-10-CM

## 2014-06-03 DIAGNOSIS — R29898 Other symptoms and signs involving the musculoskeletal system: Secondary | ICD-10-CM

## 2014-06-03 DIAGNOSIS — C4442 Squamous cell carcinoma of skin of scalp and neck: Secondary | ICD-10-CM

## 2014-06-03 DIAGNOSIS — R2231 Localized swelling, mass and lump, right upper limb: Secondary | ICD-10-CM

## 2014-06-03 NOTE — Progress Notes (Signed)
Patient ID: [name], male   DOB: 12-May-1935, 79 y.o.   MRN: [id number]  Location:  [facility] Provider:  Jonelle Sidle L. Mariea Clonts, D.O., C.M.D.  Code Status:  DNR  Chief Complaint  Patient presents with  . Medical Management of Chronic Issues    HPI:  79 yo white male with h/o DMII with PVD and osteomyelitis, afib, chronic pain, chronic constipation, chronic diastolic chf, htn, esophageal stricture, GERD, adhesive capsulitis shoulder, left dupuytren's contracture, squamous cell ca of scalp seen for acute visit due to right dorsal forearm lump that appeared in the past week or so.  It is nonpainful to him.  He just finds it odd so wanted it checked out.    Glucose readings are doing better after insulin adjustments were made over the past several weeks.    He will now be getting radiation treatments to his scalp due to skin cancer there.     Review of Systems:  Review of Systems  Constitutional: Negative for fever.  Eyes: Negative for blurred vision.  Respiratory: Negative for cough and shortness of breath.   Cardiovascular: Negative for chest pain.  Gastrointestinal: Negative for abdominal pain and constipation.  Genitourinary: Negative for dysuria.  Musculoskeletal:       New bump on right elbow  Neurological: Negative for dizziness and headaches.  Psychiatric/Behavioral: Negative for depression and memory loss.    Medications: Patient's Medications  New Prescriptions   No medications on file  Previous Medications   ACCU-CHEK SMARTVIEW TEST STRIP       APIXABAN (ELIQUIS) 5 MG TABS TABLET    Take 5 mg by mouth daily.   ASSURE COMFORT LANCETS 30G MISC       BLOOD GLUCOSE CALIBRATION (ACCU-CHEK SMARTVIEW CONTROL) LIQD       CEPHALEXIN (KEFLEX) 500 MG CAPSULE    Take 1 capsule (500 mg total) by mouth 2 (two) times daily.   DIGOXIN (LANOXIN) 0.125 MG TABLET    Take 0.125 mg by mouth daily.   DILTIAZEM (CARDIZEM CD) 180 MG 24 HR CAPSULE    Take 1 capsule (180 mg  total) by mouth daily.   FERROUS FUMARATE (HEMOCYTE - 106 MG FE) 325 (106 FE) MG TABS TABLET    Take 1 tablet (106 mg of iron total) by mouth daily at 12 noon.   FUROSEMIDE (LASIX) 40 MG TABLET    Take 40 mg by mouth daily.    GABAPENTIN (NEURONTIN) 100 MG CAPSULE    Take 1 capsule (100 mg total) by mouth every 12 (twelve) hours.   INSULIN ASPART (NOVOLOG FLEXPEN) 100 UNIT/ML FLEXPEN    Inject 0-10 Units into the skin 3 (three) times daily with meals. Per sliding scale:  cbg 150-250 5 units, 251-300 8 units, 301-500 10 units (call MD if over 350)   INSULIN GLARGINE (LANTUS SOLOSTAR) 100 UNIT/ML SOLOSTAR PEN    Inject 20 Units into the skin at bedtime.   INSULIN SYRINGE-NEEDLE U-100 (INSULIN SYRINGE .3CC/31GX5/16") 31G X 5/16" 0.3 ML MISC    0.3 mL 31 x 5/16"   LISINOPRIL (PRINIVIL,ZESTRIL) 10 MG TABLET    Take 10 mg by mouth daily.   LORAZEPAM (ATIVAN) 0.5 MG TABLET    Take 0.5 tablets (0.25 mg total) by mouth 2 (two) times daily as needed for anxiety.   MELATONIN 5 MG TABS    Take 5 mg by mouth at bedtime as needed (insomnia).   METHOCARBAMOL (ROBAXIN) 500 MG TABLET    Take 1 tablet (500  mg total) by mouth 3 (three) times daily.   MULTIPLE VITAMINS-MINERALS (DECUBI-VITE) CAPS    Take 1 capsule by mouth daily.    NUTRITIONAL SUPPLEMENTS (FEEDING SUPPLEMENT, GLUCERNA 1.2 CAL,) LIQD    Place 1,000 mLs into feeding tube continuous.   OMEPRAZOLE (PRILOSEC) 20 MG CAPSULE    Take 20 mg by mouth 2 (two) times daily before a meal.   OXYCODONE-ACETAMINOPHEN (PERCOCET) 10-325 MG PER TABLET    Take one tablet by mouth every 8 hours for pain   POLYETHYLENE GLYCOL (MIRALAX / GLYCOLAX) PACKET    Take 17 g by mouth daily as needed.   SACCHAROMYCES BOULARDII (FLORASTOR) 250 MG CAPSULE    Take 250 mg by mouth 2 (two) times daily.   SENNA-DOCUSATE (SENNA-S) 8.6-50 MG PER TABLET    Take 1 tablet by mouth daily.  Modified Medications   No medications on file  Discontinued Medications   FLUORURACIL (CARAC) 0.5 %  CREAM       MUPIROCIN OINTMENT (BACTROBAN) 2 %    Place 1 application into the nose daily. To biopsy site on scalp until healed   NEOMYCIN-POLYMYXIN-DEXAMETH OP    3.5-10,000-0.1 mg-unit/g-%    Physical Exam: Filed Vitals:   06/03/14 1154  BP: 157/67  Pulse: 73  Temp: 97.7 F (36.5 C)  Resp: 20  Height: 5\' 10"  (1.778 m)  Weight: 161 lb (73.029 kg)  SpO2: 94%  Physical Exam  Constitutional: He is oriented to person, place, and time. He appears well-developed and well-nourished. No distress.  Cardiovascular: Normal rate, regular rhythm, normal heart sounds and intact distal pulses.   Pulmonary/Chest: Effort normal and breath sounds normal. No respiratory distress.  Musculoskeletal:  Right dorsal forearm with cyst distal to olecranon process, nontender, mobile;  No warmth, erythema, drainage;  Bilateral LE amputee  Neurological: He is alert and oriented to person, place, and time.  Skin:  Scalp with several excoriated crusty lesions present;  Has radiation marks on his scalp  Psychiatric: He has a normal mood and affect.    Labs reviewed: Basic Metabolic Panel:  Recent Labs  03/19/14 0543 03/20/14 0645 04/02/14 0835  NA 141 137 138  K 4.1 4.1 4.9  CL 103 98 100  CO2 26 26 26   GLUCOSE 106* 95 150*  BUN 25* 25* 34*  CREATININE 1.40* 1.50* 1.44*  CALCIUM 8.8 8.7 9.2    Liver Function Tests:  Recent Labs  03/02/14 0710 03/15/14 2019 03/16/14 0640  AST 12 13 12   ALT 7 10 9   ALKPHOS 95 122* 98  BILITOT 0.9 0.4 0.4  PROT 6.4 6.1 5.5*  ALBUMIN 2.6* 2.3* 2.2*    CBC:  Recent Labs  04/03/14 0550 04/04/14 0430 04/05/14 0615  WBC 9.2 7.8 7.4  NEUTROABS 6.4 5.1 4.5  HGB 9.2* 8.2* 8.3*  HCT 28.3* 24.6* 25.4*  MCV 85.2 87.5 86.1  PLT 232 184 183   Assessment/Plan 1. Mass of right elbow -cyst vs. Tophus -check ultrasound of it to see what it looks like -not concerning due to mobility, nontender  2. Type II or unspecified type diabetes mellitus with  peripheral circulatory disorders, not stated as uncontrolled(250.70) -much better controlled since adjustments of insulin  3. Squamous cell carcinoma of scalp and skin of neck -getting XRT to his scalp due to this at present  Family/ staff Communication: seen with unit supervisor  Goals of care: short term rehab, DNR code status  Labs/tests ordered:  [place] right elbow

## 2014-06-05 ENCOUNTER — Encounter: Payer: Self-pay | Admitting: Radiation Oncology

## 2014-06-05 DIAGNOSIS — Z510 Encounter for antineoplastic radiation therapy: Secondary | ICD-10-CM | POA: Diagnosis not present

## 2014-06-05 NOTE — Telephone Encounter (Signed)
Close encounter 

## 2014-06-05 NOTE — Progress Notes (Signed)
Electron beam treatment planning note: The patient completed his mild to call electron beam planning/calculations for treatment to his scalp. His dosimetry was reviewed and excepted. I prescribing 6000 cGy in 30 sessions to his 100% isodose line. There appears to be satisfactory coverage along the periphery. One custom block was constructed to conform the field. Isodose plan was reviewed and signed.

## 2014-06-06 LAB — CBC AND DIFFERENTIAL
HCT: 29 % — AB (ref 41–53)
Hemoglobin: 9.9 g/dL — AB (ref 13.5–17.5)

## 2014-06-12 ENCOUNTER — Ambulatory Visit (INDEPENDENT_AMBULATORY_CARE_PROVIDER_SITE_OTHER): Payer: Medicare Other | Admitting: Podiatry

## 2014-06-12 DIAGNOSIS — M79609 Pain in unspecified limb: Secondary | ICD-10-CM

## 2014-06-12 DIAGNOSIS — B351 Tinea unguium: Secondary | ICD-10-CM

## 2014-06-12 DIAGNOSIS — Q828 Other specified congenital malformations of skin: Secondary | ICD-10-CM

## 2014-06-12 DIAGNOSIS — M79672 Pain in left foot: Secondary | ICD-10-CM

## 2014-06-12 DIAGNOSIS — E1159 Type 2 diabetes mellitus with other circulatory complications: Secondary | ICD-10-CM

## 2014-06-14 NOTE — Progress Notes (Signed)
Subjective:     Patient ID: [name], male   DOB: 1935-08-21, 79 y.o.   MRN: [id number]  HPI very poor health individual with thick painful nailbeds 1-5 left foot he cannot take care of and lesion on the left hallux that he cannot take care of. Patient lost his right leg due to diabetes and poor circulation and needs to be very careful with his left foot   Review of Systems     Objective:   Physical Exam Neurovascular status unchanged left with thick nailbeds 1-5 left that are painful and keratotic lesion distal left hallux that is at risk    Assessment:     Nail disease 1-5 left and lesion which becomes irritated but is not broken down at the current time    Plan:     Debris painful nailbeds 1-5 left and lesion on the left big toe with no iatrogenic bleeding noted

## 2014-06-16 ENCOUNTER — Ambulatory Visit
Admission: RE | Admit: 2014-06-16 | Discharge: 2014-06-16 | Disposition: A | Discharge status: Home / Self Care | Payer: Medicare Other | Source: Ambulatory Visit | Attending: Radiation Oncology | Admitting: Radiation Oncology

## 2014-06-16 ENCOUNTER — Encounter: Payer: Self-pay | Admitting: Radiation Oncology

## 2014-06-16 VITALS — BP 150/63 | HR 66 | Temp 97.9°F | Resp 20

## 2014-06-16 DIAGNOSIS — C4442 Squamous cell carcinoma of skin of scalp and neck: Secondary | ICD-10-CM

## 2014-06-16 DIAGNOSIS — Z510 Encounter for antineoplastic radiation therapy: Secondary | ICD-10-CM | POA: Diagnosis not present

## 2014-06-16 NOTE — Progress Notes (Signed)
See other note from today for weekly management.

## 2014-06-16 NOTE — Progress Notes (Signed)
Weekly Management Note:  Site: Scalp Current Dose:  200  cGy Projected Dose: 6000  cGy  Narrative: The patient is seen today for routine under treatment assessment. CBCT/MVCT images/port films were reviewed. The chart was reviewed.   No complaints today.  Physical Examination: There were no vitals filed for this visit..  Weight:  . No change.  Impression: Tolerating radiation therapy well.  Plan: Continue radiation therapy as planned.

## 2014-06-16 NOTE — Progress Notes (Signed)
Patient denies pain, fatigue, loss of appetite. Patient education completed w/pt. Gave pt "Radiation and You" booklet w/all pertinent information marked and discussed, re: fatigue, hair loss/care, skin irritation/care. Pt verbalized understanding.

## 2014-06-17 ENCOUNTER — Ambulatory Visit
Admission: RE | Admit: 2014-06-17 | Discharge: 2014-06-17 | Disposition: A | Discharge status: Home / Self Care | Payer: Medicare Other | Source: Ambulatory Visit | Attending: Radiation Oncology | Admitting: Radiation Oncology

## 2014-06-17 ENCOUNTER — Encounter: Payer: Medicare Other | Admitting: Internal Medicine

## 2014-06-17 DIAGNOSIS — Z510 Encounter for antineoplastic radiation therapy: Secondary | ICD-10-CM | POA: Diagnosis not present

## 2014-06-18 ENCOUNTER — Ambulatory Visit
Admission: RE | Admit: 2014-06-18 | Discharge: 2014-06-18 | Disposition: A | Discharge status: Home / Self Care | Payer: Medicare Other | Source: Ambulatory Visit | Attending: Radiation Oncology | Admitting: Radiation Oncology

## 2014-06-18 DIAGNOSIS — Z510 Encounter for antineoplastic radiation therapy: Secondary | ICD-10-CM | POA: Diagnosis not present

## 2014-06-19 ENCOUNTER — Ambulatory Visit
Admission: RE | Admit: 2014-06-19 | Discharge: 2014-06-19 | Disposition: A | Discharge status: Home / Self Care | Payer: Medicare Other | Source: Ambulatory Visit | Attending: Radiation Oncology | Admitting: Radiation Oncology

## 2014-06-19 ENCOUNTER — Ambulatory Visit: Payer: Medicare Other | Admitting: Infectious Disease

## 2014-06-19 DIAGNOSIS — Z510 Encounter for antineoplastic radiation therapy: Secondary | ICD-10-CM | POA: Diagnosis not present

## 2014-06-20 ENCOUNTER — Ambulatory Visit
Admission: RE | Admit: 2014-06-20 | Discharge: 2014-06-20 | Disposition: A | Discharge status: Home / Self Care | Payer: Medicare Other | Source: Ambulatory Visit | Attending: Radiation Oncology | Admitting: Radiation Oncology

## 2014-06-20 DIAGNOSIS — Z510 Encounter for antineoplastic radiation therapy: Secondary | ICD-10-CM | POA: Diagnosis not present

## 2014-06-23 ENCOUNTER — Ambulatory Visit
Admission: RE | Admit: 2014-06-23 | Discharge: 2014-06-23 | Disposition: A | Discharge status: Home / Self Care | Payer: Medicare Other | Source: Ambulatory Visit | Attending: Radiation Oncology | Admitting: Radiation Oncology

## 2014-06-23 VITALS — BP 145/62 | HR 70 | Temp 98.7°F

## 2014-06-23 DIAGNOSIS — Z510 Encounter for antineoplastic radiation therapy: Secondary | ICD-10-CM | POA: Diagnosis not present

## 2014-06-23 DIAGNOSIS — C4442 Squamous cell carcinoma of skin of scalp and neck: Secondary | ICD-10-CM

## 2014-06-23 NOTE — Progress Notes (Signed)
Weekly assessment of radiation to scalp.Completed 6 of 30 treatments.No visible skin changes but states scalp is starting to itch.will give tube biafine to apply twice  daily.History of chronic back pain.

## 2014-06-23 NOTE — Progress Notes (Signed)
Weekly Management Note:  Site: Scalp Current Dose:  1200  cGy Projected Dose: 6000  cGy  Narrative: The patient is seen today for routine under treatment assessment. CBCT/MVCT images/port films were reviewed. The chart was reviewed.   He is without complaints today except for slight pruritus. He was given Biafine cream to use today.  Physical Examination:  Filed Vitals:   06/23/14 1538  BP: 145/62  Pulse: 70  Temp: 98.7 F (37.1 C)  .  Weight:  . There are no significant skin changes along the scalp.  Impression: Tolerating radiation therapy well.  Plan: Continue radiation therapy as planned.

## 2014-06-24 ENCOUNTER — Ambulatory Visit
Admission: RE | Admit: 2014-06-24 | Discharge: 2014-06-24 | Disposition: A | Discharge status: Home / Self Care | Payer: Medicare Other | Source: Ambulatory Visit | Attending: Radiation Oncology | Admitting: Radiation Oncology

## 2014-06-24 DIAGNOSIS — Z510 Encounter for antineoplastic radiation therapy: Secondary | ICD-10-CM | POA: Diagnosis not present

## 2014-06-25 ENCOUNTER — Ambulatory Visit
Admission: RE | Admit: 2014-06-25 | Discharge: 2014-06-25 | Disposition: A | Discharge status: Home / Self Care | Payer: Medicare Other | Source: Ambulatory Visit | Attending: Radiation Oncology | Admitting: Radiation Oncology

## 2014-06-25 DIAGNOSIS — Z510 Encounter for antineoplastic radiation therapy: Secondary | ICD-10-CM | POA: Diagnosis not present

## 2014-06-26 ENCOUNTER — Ambulatory Visit
Admission: RE | Admit: 2014-06-26 | Discharge: 2014-06-26 | Disposition: A | Discharge status: Home / Self Care | Payer: Medicare Other | Source: Ambulatory Visit | Attending: Radiation Oncology | Admitting: Radiation Oncology

## 2014-06-26 DIAGNOSIS — Z510 Encounter for antineoplastic radiation therapy: Secondary | ICD-10-CM | POA: Diagnosis not present

## 2014-06-27 ENCOUNTER — Ambulatory Visit
Admission: RE | Admit: 2014-06-27 | Discharge: 2014-06-27 | Disposition: A | Discharge status: Home / Self Care | Payer: Medicare Other | Source: Ambulatory Visit | Attending: Radiation Oncology | Admitting: Radiation Oncology

## 2014-06-27 DIAGNOSIS — Z510 Encounter for antineoplastic radiation therapy: Secondary | ICD-10-CM | POA: Diagnosis not present

## 2014-06-30 ENCOUNTER — Other Ambulatory Visit: Payer: Self-pay | Admitting: *Deleted

## 2014-06-30 ENCOUNTER — Encounter: Payer: Self-pay | Admitting: Radiation Oncology

## 2014-06-30 ENCOUNTER — Encounter: Payer: Self-pay | Admitting: Infectious Disease

## 2014-06-30 ENCOUNTER — Ambulatory Visit
Admission: RE | Admit: 2014-06-30 | Discharge: 2014-06-30 | Disposition: A | Discharge status: Home / Self Care | Payer: Medicare Other | Source: Ambulatory Visit | Attending: Radiation Oncology | Admitting: Radiation Oncology

## 2014-06-30 ENCOUNTER — Ambulatory Visit (INDEPENDENT_AMBULATORY_CARE_PROVIDER_SITE_OTHER): Payer: Medicare Other | Admitting: Infectious Disease

## 2014-06-30 VITALS — BP 148/63 | HR 69 | Resp 16

## 2014-06-30 VITALS — BP 123/72 | HR 57 | Temp 97.9°F | Wt 163.0 lb

## 2014-06-30 DIAGNOSIS — T879 Unspecified complications of amputation stump: Secondary | ICD-10-CM

## 2014-06-30 DIAGNOSIS — E1169 Type 2 diabetes mellitus with other specified complication: Secondary | ICD-10-CM

## 2014-06-30 DIAGNOSIS — M869 Osteomyelitis, unspecified: Secondary | ICD-10-CM

## 2014-06-30 DIAGNOSIS — Z510 Encounter for antineoplastic radiation therapy: Secondary | ICD-10-CM | POA: Diagnosis not present

## 2014-06-30 DIAGNOSIS — B951 Streptococcus, group B, as the cause of diseases classified elsewhere: Secondary | ICD-10-CM

## 2014-06-30 DIAGNOSIS — C4442 Squamous cell carcinoma of skin of scalp and neck: Secondary | ICD-10-CM

## 2014-06-30 DIAGNOSIS — M9080 Osteopathy in diseases classified elsewhere, unspecified site: Secondary | ICD-10-CM

## 2014-06-30 DIAGNOSIS — E11621 Type 2 diabetes mellitus with foot ulcer: Secondary | ICD-10-CM

## 2014-06-30 DIAGNOSIS — A491 Streptococcal infection, unspecified site: Secondary | ICD-10-CM

## 2014-06-30 DIAGNOSIS — L97509 Non-pressure chronic ulcer of other part of unspecified foot with unspecified severity: Secondary | ICD-10-CM

## 2014-06-30 MED ORDER — OXYCODONE-ACETAMINOPHEN 10-325 MG PO TABS: ORAL_TABLET | ORAL | Status: DC

## 2014-06-30 NOTE — Telephone Encounter (Signed)
[facility]  

## 2014-06-30 NOTE — Progress Notes (Signed)
   Weekly Management Note:  Outpatient    ICD-9-CM  1. Squamous cell carcinoma of scalp and skin of neck 173.42    Current Dose:  22 Gy  Projected Dose: 60 Gy   Narrative:  The patient presents for routine under treatment assessment.  CBCT/MVCT images/Port film x-rays were reviewed.  The chart was checked. Doing well, no new complaints  Physical Findings:  blood pressure is 148/63 and his pulse is 69. His respiration is 16.  NAD, skin over scalp erythematous with some scaling (dry)  Impression:  The patient is tolerating radiotherapy.  Plan:  Continue radiotherapy as planned.  Biafine BID-TID  ________________________________   Eppie Gibson, M.D.

## 2014-06-30 NOTE — Progress Notes (Signed)
Subjective:    Patient ID: [name], male    DOB: 05/07/1935, 79 y.o.   MRN: [id number]  HPI   79 year-old patient with past medical history significant for diabetes mellitus, CHF, atrial fibrillation on chronic anticoagulation, syncope with tachybradycardia syndrome  status post pacemaker placement who was referred to [place] by Dr. Arnoldo Morale and Dr. 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  He stated he's had a wound on his heel for approximately half a year but is worsened in the last 6-8 weeks. He's been followed closely by Dr. Dellia Nims in the wound care clinic his prescribed him doxycycline and more recently ciprofloxacin after recovering Pseudomonas from a deep tissue culture.  Pseudomonas was sensitive to all antibiotics tested including ciprofloxacin and levofloxacin with MIC the left less than equal 0.25 and 0.5 also sensitive to Zosyn imipenem Ceftaz and cefepime gentamicin and tobramycin.  Patient had undergone a CT scan of the heel which showed:  Osteolysis and active osteomyelitis of the calcaneus.  He states that ciprofloxacin is improved his wound but the wound is now getting bigger after debridement from wound care and treatment with the hyperbaric chamber.   When I first saw him I had cautioned that he would need BKA to cure his chronic deep bone infection but that we could pursue bone biopsy followed by targetted IV abx followed by indefinite oral abx. I had referred him to Orthopedics but he ended up being admitted to [facility] for worsening infection, foul odor erythema from around the wound.   Dr. Migdalia Dk took him to the OR and did a bone biopsy for culture which yielded abundant GBS.   The following day however he decided he had better proceed with curative BKA which Dr. Sharol Given performed. She was seen by my partners in ID and abx were stopped within 72 hours post surgery.  He has been dc to [facility]. Unfortunately he   developed a soft  tissue infection involving his stump with purulence at the site and Dr Sharol Given started him on doxy--   I saw him cultured the area of purulence and added keflex and he grew a Klebsiella PNA that was S to this abx.  Since then the would has worsening dehiscence and purulence. He saw Dr. Sharol Given  Who took him to the OR for I and D of stump infection on  02/14/14. Marland Kitchen He has continued on doxy and keflex but then bled from the stump and was admitted to the hospital sp transfusion and re-exam by Orthopedics. He then underwent yet another revision of stump on 03/18/14. Unfortunately he had progressive gangrenous changes of this site and required admission for AKA by Dr. Sharol Given on 04/02/14. He has continued on keflex and doxy postoperatively though we stopped a few weeks ago.  He had continued to have drainage from this site but the output has gone down. I saw him last month and we kept him on keflex. Now stump is completely healed up. See pictures.     Review of Systems  Constitutional: Negative for fever, chills, diaphoresis, appetite change and fatigue.  Eyes: Negative for photophobia and visual disturbance.  Respiratory: Negative for cough, chest tightness, shortness of breath and wheezing.   Cardiovascular: Negative for chest pain and palpitations.  Gastrointestinal: Negative for nausea, vomiting and diarrhea.  Musculoskeletal: Negative for back pain and myalgias.  Skin: Positive for wound. Negative for color change, pallor and rash.  Neurological: Negative for dizziness, tremors, weakness and light-headedness.  Hematological: Bruises/bleeds easily.  Psychiatric/Behavioral: Negative for behavioral problems, confusion, sleep disturbance, dysphoric mood, decreased concentration and agitation.       Objective:   Physical Exam  Constitutional: He is oriented to person, place, and time. He appears well-developed and well-nourished. No distress.  HENT:  Head: Normocephalic and atraumatic.  Mouth/Throat:  Oropharynx is clear and moist. No oropharyngeal exudate.  Eyes: Conjunctivae and EOM are normal. No scleral icterus.  Neck: Normal range of motion. Neck supple.  Cardiovascular: Normal rate and normal heart sounds.  An irregularly irregular rhythm present. Exam reveals no friction rub.   No murmur heard. Pulmonary/Chest: Effort normal and breath sounds normal. No respiratory distress. He has no wheezes. He has no rales. He exhibits no tenderness.  Abdominal: Soft. Bowel sounds are normal. He exhibits no distension. There is no tenderness. There is no rebound.  Musculoskeletal: He exhibits no edema and no tenderness.       Legs: Neurological: He is alert and oriented to person, place, and time. He exhibits normal muscle tone. Coordination normal.  Skin: Skin is warm and dry. He is not diaphoretic. There is erythema. No pallor.  Psychiatric: He has a normal mood and affect. His behavior is normal. Judgment and thought content normal.   Right AKA site: has improved but still with drainage and fungation from middle of wound  Last visit      TOday's visit:                    Assessment & Plan:   Stump infection with gangrenous changes sp AKA now heale   Diabetic osteomyelitis of the calcaneus: cured with BKA, GBS was culprit ID on deep culture   Left leg with erythematous area resolved:  Still needs to follow closely with [facility] to prevent any new ulcers, control DM

## 2014-06-30 NOTE — Telephone Encounter (Signed)
[facility] 

## 2014-06-30 NOTE — Progress Notes (Signed)
Patient unable to stand for weight due to above the knee amputation of right leg. Faint hyperpigmentation with dry small area of dry desquamation noted top of patient's scalp. Patient reports that he has only used biafine once. Encouraged patient to use biafine bid but, not four hours prior to treatment. Patient verbalized understanding. Blood pressure slightly elevated. Patient scheduled to see his PCP at nursing home tomorrow to discuss fluctuating bp. Patient denies pain.

## 2014-07-01 ENCOUNTER — Ambulatory Visit
Admission: RE | Admit: 2014-07-01 | Discharge: 2014-07-01 | Disposition: A | Discharge status: Home / Self Care | Payer: Medicare Other | Source: Ambulatory Visit | Attending: Radiation Oncology | Admitting: Radiation Oncology

## 2014-07-01 DIAGNOSIS — Z510 Encounter for antineoplastic radiation therapy: Secondary | ICD-10-CM | POA: Diagnosis not present

## 2014-07-02 ENCOUNTER — Ambulatory Visit
Admission: RE | Admit: 2014-07-02 | Discharge: 2014-07-02 | Disposition: A | Discharge status: Home / Self Care | Payer: Medicare Other | Source: Ambulatory Visit | Attending: Radiation Oncology | Admitting: Radiation Oncology

## 2014-07-02 DIAGNOSIS — Z510 Encounter for antineoplastic radiation therapy: Secondary | ICD-10-CM | POA: Diagnosis not present

## 2014-07-03 ENCOUNTER — Ambulatory Visit
Admission: RE | Admit: 2014-07-03 | Discharge: 2014-07-03 | Disposition: A | Discharge status: Home / Self Care | Payer: Medicare Other | Source: Ambulatory Visit | Attending: Radiation Oncology | Admitting: Radiation Oncology

## 2014-07-03 DIAGNOSIS — Z510 Encounter for antineoplastic radiation therapy: Secondary | ICD-10-CM | POA: Diagnosis not present

## 2014-07-04 ENCOUNTER — Ambulatory Visit
Admission: RE | Admit: 2014-07-04 | Discharge: 2014-07-04 | Disposition: A | Discharge status: Home / Self Care | Payer: Medicare Other | Source: Ambulatory Visit | Attending: Radiation Oncology | Admitting: Radiation Oncology

## 2014-07-04 DIAGNOSIS — Z510 Encounter for antineoplastic radiation therapy: Secondary | ICD-10-CM | POA: Diagnosis not present

## 2014-07-07 ENCOUNTER — Ambulatory Visit
Admission: RE | Admit: 2014-07-07 | Discharge: 2014-07-07 | Disposition: A | Discharge status: Home / Self Care | Payer: Medicare Other | Source: Ambulatory Visit | Attending: Radiation Oncology | Admitting: Radiation Oncology

## 2014-07-07 VITALS — BP 138/48 | HR 58 | Temp 98.6°F | Resp 12

## 2014-07-07 DIAGNOSIS — C4442 Squamous cell carcinoma of skin of scalp and neck: Secondary | ICD-10-CM

## 2014-07-07 DIAGNOSIS — Z510 Encounter for antineoplastic radiation therapy: Secondary | ICD-10-CM | POA: Diagnosis not present

## 2014-07-07 NOTE — Progress Notes (Signed)
Weekly Management Note:  Site: Scalp Current Dose:  3200  cGy Projected Dose: 6000  cGy  Narrative: The patient is seen today for routine under treatment assessment. CBCT/MVCT images/port films were reviewed. The chart was reviewed.   He is without complaints today except for occasional pruritus along his scalp. He uses Biafine cream. He's had some difficulty with control of his blood sugars and he will see physician tomorrow.  Physical Examination:  Filed Vitals:   07/07/14 1545  BP: 138/48  Pulse: 58  Temp: 98.6 F (37 C)  Resp: 12  .  Weight:  . There is mild erythema along his scalp with some crusting of his areas of keratosis.  Impression: Tolerating radiation therapy well.  Plan: Continue radiation therapy as planned.

## 2014-07-07 NOTE — Progress Notes (Signed)
He is currently in no pain. Reports he phantom pain in his right foot today.  Pt complains of pruritis over treatment area. Skin over scalp area presenting slight erythema, and some dry yellow and brown scabbing.  He also reports he gets "sinus headaches" over the frontal area post radiation treatment.  Fatigue and Generalized Weakness also reported. The patient eats a regular, healthy diet. Reports his blood sugars have been 300+ and he hopes to get on metformin asap.

## 2014-07-08 ENCOUNTER — Ambulatory Visit
Admission: RE | Admit: 2014-07-08 | Discharge: 2014-07-08 | Disposition: A | Discharge status: Home / Self Care | Payer: Medicare Other | Source: Ambulatory Visit | Attending: Radiation Oncology | Admitting: Radiation Oncology

## 2014-07-08 ENCOUNTER — Non-Acute Institutional Stay (SKILLED_NURSING_FACILITY): Payer: Medicare Other | Admitting: Internal Medicine

## 2014-07-08 ENCOUNTER — Encounter: Payer: Self-pay | Admitting: Internal Medicine

## 2014-07-08 DIAGNOSIS — Z510 Encounter for antineoplastic radiation therapy: Secondary | ICD-10-CM | POA: Diagnosis not present

## 2014-07-08 DIAGNOSIS — S78119A Complete traumatic amputation at level between unspecified hip and knee, initial encounter: Secondary | ICD-10-CM

## 2014-07-08 DIAGNOSIS — Z89611 Acquired absence of right leg above knee: Secondary | ICD-10-CM

## 2014-07-08 DIAGNOSIS — E1159 Type 2 diabetes mellitus with other circulatory complications: Secondary | ICD-10-CM

## 2014-07-08 DIAGNOSIS — C4442 Squamous cell carcinoma of skin of scalp and neck: Secondary | ICD-10-CM

## 2014-07-08 NOTE — Progress Notes (Signed)
Patient ID: [name], male   DOB: 05/07/1935, 79 y.o.   MRN: [id number]  Location:  [facility] Provider:  Jonelle Sidle L. Mariea Clonts, D.O., C.M.D.  Code Status:  DNR  Chief Complaint  Patient presents with  . Acute Visit    HPI:  79 yo white male here for short term rehab s/p right BKA that then required eventual revision to AKA was seen for mgt of his diabetes.  He notes his glucose readings are remaining high despite avoiding sweets and starchy foods.  He asks about some snacks he has which are sugar free and some nuts which should be ok.  Upon review of his glucose readings, they have been generally in the upper 100s at the lowest to upper 300s at the highest.  He has not had any lows whatsoever.  His renal function is borderline due to his diabetes for going back on metformin and we discussed that with his cr 1.44 last eval in this system.   He also updated me on his progress with his radiation treatments for the skin cancer on his scalp.   He has been fitted for his right leg prosthetic, as well, and was allowed to try it out.  He is very excited about getting to walk again soon.  Review of Systems:  Review of Systems  Constitutional: Negative for fever and malaise/fatigue.  HENT: Negative for congestion.   Eyes: Negative for blurred vision.  Respiratory: Negative for shortness of breath.   Cardiovascular: Negative for chest pain.  Gastrointestinal: Negative for constipation.  Genitourinary: Negative for dysuria.  Musculoskeletal: Negative for falls.       Pain is well controlled  Skin:       Getting xrt to scalp  Neurological: Negative for dizziness.  Endo/Heme/Allergies: Bruises/bleeds easily.       Hyperglycemia  Psychiatric/Behavioral: Negative for depression and memory loss. The patient is nervous/anxious.        Anxiety well controlled    Medications: Patient's Medications  New Prescriptions   No medications on file  Previous Medications   ACCU-CHEK  SMARTVIEW TEST STRIP       APIXABAN (ELIQUIS) 5 MG TABS TABLET    Take 5 mg by mouth daily.   ASSURE COMFORT LANCETS 30G MISC       BLOOD GLUCOSE CALIBRATION (ACCU-CHEK SMARTVIEW CONTROL) LIQD       DIGOXIN (LANOXIN) 0.125 MG TABLET    Take 0.125 mg by mouth daily.   DILTIAZEM (CARDIZEM CD) 180 MG 24 HR CAPSULE    Take 1 capsule (180 mg total) by mouth daily.   FERROUS FUMARATE (HEMOCYTE - 106 MG FE) 325 (106 FE) MG TABS TABLET    Take 1 tablet (106 mg of iron total) by mouth daily at 12 noon.   FUROSEMIDE (LASIX) 40 MG TABLET    Take 40 mg by mouth daily.    GABAPENTIN (NEURONTIN) 100 MG CAPSULE    Take 1 capsule (100 mg total) by mouth every 12 (twelve) hours.   INSULIN ASPART (NOVOLOG FLEXPEN) 100 UNIT/ML FLEXPEN    Inject 0-10 Units into the skin 3 (three) times daily with meals. Per sliding scale:  cbg 150-250 5 units, 251-300 8 units, 301-500 10 units (call MD if over 350)   INSULIN SYRINGE-NEEDLE U-100 (INSULIN SYRINGE .3CC/31GX5/16") 31G X 5/16" 0.3 ML MISC    0.3 mL 31 x 5/16"   LISINOPRIL (PRINIVIL,ZESTRIL) 10 MG TABLET    Take 10 mg by mouth daily.  LORAZEPAM (ATIVAN) 0.5 MG TABLET    Take 0.5 tablets (0.25 mg total) by mouth 2 (two) times daily as needed for anxiety.   MELATONIN 5 MG TABS    Take 5 mg by mouth at bedtime as needed (insomnia).   METHOCARBAMOL (ROBAXIN) 500 MG TABLET    Take 1 tablet (500 mg total) by mouth 3 (three) times daily.   MULTIPLE VITAMINS-MINERALS (DECUBI-VITE) CAPS    Take 1 capsule by mouth daily.    NUTRITIONAL SUPPLEMENTS (FEEDING SUPPLEMENT, GLUCERNA 1.2 CAL,) LIQD    Place 1,000 mLs into feeding tube continuous.   OMEPRAZOLE (PRILOSEC) 20 MG CAPSULE    Take 20 mg by mouth 2 (two) times daily before a meal.   OXYCODONE-ACETAMINOPHEN (PERCOCET) 10-325 MG PER TABLET    Take one tablet by mouth every 8 hours for pain   POLYETHYLENE GLYCOL (MIRALAX / GLYCOLAX) PACKET    Take 17 g by mouth daily as needed.   SENNA-DOCUSATE (SENNA-S) 8.6-50 MG PER TABLET     Take 1 tablet by mouth daily.  Modified Medications   Modified Medication Previous Medication   INSULIN GLARGINE (LANTUS) 100 UNIT/ML SOLOSTAR PEN Insulin Glargine (LANTUS SOLOSTAR) 100 UNIT/ML Solostar Pen      Inject 24 Units into the skin at bedtime.    Inject 20 Units into the skin at bedtime.  Discontinued Medications   CEPHALEXIN (KEFLEX) 500 MG CAPSULE    Take 1 capsule (500 mg total) by mouth 2 (two) times daily.   EMOLLIENT (BIAFINE) CREAM    Apply topically as needed.   SACCHAROMYCES BOULARDII (FLORASTOR) 250 MG CAPSULE    Take 250 mg by mouth 2 (two) times daily.    Physical Exam: Filed Vitals:   07/08/14 1116  BP: 117/77  Pulse: 88  Temp: 97.6 F (36.4 C)  Resp: 20  Height: 6' (1.829 m)  Weight: 243 lb (110.224 kg)  SpO2: 96%  Physical Exam  Constitutional: He is oriented to person, place, and time. He appears well-developed and well-nourished. No distress.  White male seated in wheelchair  HENT:  Head: Normocephalic and atraumatic.  Cardiovascular: Normal rate and regular rhythm.   Pulmonary/Chest: Effort normal and breath sounds normal.  Abdominal: Bowel sounds are normal.  Musculoskeletal: Normal range of motion. He exhibits no edema.  Has right AKA  Neurological: He is alert and oriented to person, place, and time.  Skin: Skin is warm and dry.  Has sticker on his scalp where he is getting his xrt    Labs reviewed: Basic Metabolic Panel:  Recent Labs  03/19/14 0543 03/20/14 0645 04/02/14 0835  NA 141 137 138  K 4.1 4.1 4.9  CL 103 98 100  CO2 26 26 26   GLUCOSE 106* 95 150*  BUN 25* 25* 34*  CREATININE 1.40* 1.50* 1.44*  CALCIUM 8.8 8.7 9.2    Liver Function Tests:  Recent Labs  03/02/14 0710 03/15/14 2019 03/16/14 0640  AST 12 13 12   ALT 7 10 9   ALKPHOS 95 122* 98  BILITOT 0.9 0.4 0.4  PROT 6.4 6.1 5.5*  ALBUMIN 2.6* 2.3* 2.2*    CBC:  Recent Labs  04/03/14 0550 04/04/14 0430 04/05/14 0615 05/12/14 06/06/14  WBC 9.2 7.8 7.4  7.4  --   NEUTROABS 6.4 5.1 4.5  --   --   HGB 9.2* 8.2* 8.3*  --  9.9*  HCT 28.3* 24.6* 25.4*  --  29*  MCV 85.2 87.5 86.1  --   --   PLT  232 184 183 198  --      Assessment/Plan 1. Type II or unspecified type diabetes mellitus with peripheral circulatory disorders, not stated as uncontrolled(250.70) -will continue his current lantus and novolog meal coverage and add tradjenta 5mg  daily at his largest meal to help improve glucose readings -check hba1c q 3 mos  -cannot take metformin due to renal dysfunction being too high risk for going over cr 1.5 (1.44 last check)  2. 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 S/P AKA (above knee amputation) unilateral, right -is getting prosthesis and will continue therapy with it to get back to walking independently so he can return home  Family/ staff Communication: seen with unit supervisor  Goals of care: here for short term rehab with goal to return home  Labs/tests ordered:  Cont cbgs ac and hs   Tateanna Bach L. Itzae Miralles, D.O. [facility], [place], [place] phone [phone]):  [phone] Facility fax:  [phone] Cell Phone (Mon-Fri 8am-5pm):  [phone] On Call Provider (follow prompts after 5pm & weekends):  [phone]

## 2014-07-09 ENCOUNTER — Ambulatory Visit
Admission: RE | Admit: 2014-07-09 | Discharge: 2014-07-09 | Disposition: A | Discharge status: Home / Self Care | Payer: Medicare Other | Source: Ambulatory Visit | Attending: Radiation Oncology | Admitting: Radiation Oncology

## 2014-07-09 DIAGNOSIS — Z510 Encounter for antineoplastic radiation therapy: Secondary | ICD-10-CM | POA: Diagnosis not present

## 2014-07-10 ENCOUNTER — Ambulatory Visit
Admission: RE | Admit: 2014-07-10 | Discharge: 2014-07-10 | Disposition: A | Discharge status: Home / Self Care | Payer: Medicare Other | Source: Ambulatory Visit | Attending: Radiation Oncology | Admitting: Radiation Oncology

## 2014-07-10 DIAGNOSIS — Z510 Encounter for antineoplastic radiation therapy: Secondary | ICD-10-CM | POA: Diagnosis not present

## 2014-07-11 ENCOUNTER — Ambulatory Visit
Admission: RE | Admit: 2014-07-11 | Discharge: 2014-07-11 | Disposition: A | Discharge status: Home / Self Care | Payer: Medicare Other | Source: Ambulatory Visit | Attending: Radiation Oncology | Admitting: Radiation Oncology

## 2014-07-11 DIAGNOSIS — Z510 Encounter for antineoplastic radiation therapy: Secondary | ICD-10-CM | POA: Diagnosis not present

## 2014-07-12 ENCOUNTER — Encounter: Payer: Self-pay | Admitting: Internal Medicine

## 2014-07-14 ENCOUNTER — Ambulatory Visit
Admission: RE | Admit: 2014-07-14 | Discharge: 2014-07-14 | Disposition: A | Discharge status: Home / Self Care | Payer: Medicare Other | Source: Ambulatory Visit | Attending: Radiation Oncology | Admitting: Radiation Oncology

## 2014-07-14 VITALS — BP 124/50 | HR 57 | Temp 98.7°F

## 2014-07-14 DIAGNOSIS — C4442 Squamous cell carcinoma of skin of scalp and neck: Secondary | ICD-10-CM

## 2014-07-14 DIAGNOSIS — Z510 Encounter for antineoplastic radiation therapy: Secondary | ICD-10-CM | POA: Diagnosis not present

## 2014-07-14 NOTE — Progress Notes (Signed)
Weekly Management Note:  Site: Skin Current Dose:  4200  cGy Projected Dose: 6  cGy  Narrative: The patient is seen today for routine under treatment assessment. CBCT/MVCT images/port films were reviewed. The chart was reviewed.   He is still doing well. He uses Radioplex gel.  Physical Examination:  Filed Vitals:   07/14/14 1624  BP: 124/50  Pulse: 57  Temp: 98.7 F (37.1 C)  .  Weight:  . There is moderate erythema along the scalp with continued crusting of his carcinomas. There is no moist desquamation.  Impression: Tolerating radiation therapy well.  Plan: Continue radiation therapy as planned.

## 2014-07-14 NOTE — Progress Notes (Signed)
Weekly assessment of radiation to scalp.Completed 21 treatments.Scalp is red without peeling.Patient is applying biafine at night.states he has new medication for diabetes that will need to be added to med list.

## 2014-07-15 ENCOUNTER — Ambulatory Visit
Admission: RE | Admit: 2014-07-15 | Discharge: 2014-07-15 | Disposition: A | Discharge status: Home / Self Care | Payer: Medicare Other | Source: Ambulatory Visit | Attending: Radiation Oncology | Admitting: Radiation Oncology

## 2014-07-15 DIAGNOSIS — Z510 Encounter for antineoplastic radiation therapy: Secondary | ICD-10-CM | POA: Diagnosis not present

## 2014-07-16 ENCOUNTER — Ambulatory Visit
Admission: RE | Admit: 2014-07-16 | Discharge: 2014-07-16 | Disposition: A | Discharge status: Home / Self Care | Payer: Medicare Other | Source: Ambulatory Visit | Attending: Radiation Oncology | Admitting: Radiation Oncology

## 2014-07-16 DIAGNOSIS — Z510 Encounter for antineoplastic radiation therapy: Secondary | ICD-10-CM | POA: Diagnosis not present

## 2014-07-17 ENCOUNTER — Ambulatory Visit
Admission: RE | Admit: 2014-07-17 | Discharge: 2014-07-17 | Disposition: A | Discharge status: Home / Self Care | Payer: Medicare Other | Source: Ambulatory Visit | Attending: Radiation Oncology | Admitting: Radiation Oncology

## 2014-07-17 DIAGNOSIS — Z510 Encounter for antineoplastic radiation therapy: Secondary | ICD-10-CM | POA: Diagnosis not present

## 2014-07-18 ENCOUNTER — Ambulatory Visit
Admission: RE | Admit: 2014-07-18 | Discharge: 2014-07-18 | Disposition: A | Discharge status: Home / Self Care | Payer: Medicare Other | Source: Ambulatory Visit | Attending: Radiation Oncology | Admitting: Radiation Oncology

## 2014-07-18 DIAGNOSIS — Z510 Encounter for antineoplastic radiation therapy: Secondary | ICD-10-CM | POA: Diagnosis not present

## 2014-07-21 ENCOUNTER — Ambulatory Visit
Admission: RE | Admit: 2014-07-21 | Discharge: 2014-07-21 | Disposition: A | Discharge status: Home / Self Care | Payer: Medicare Other | Source: Ambulatory Visit | Attending: Radiation Oncology | Admitting: Radiation Oncology

## 2014-07-21 ENCOUNTER — Encounter: Payer: Self-pay | Admitting: Radiation Oncology

## 2014-07-21 VITALS — BP 146/46 | HR 68 | Temp 99.5°F | Resp 20

## 2014-07-21 DIAGNOSIS — Z510 Encounter for antineoplastic radiation therapy: Secondary | ICD-10-CM | POA: Diagnosis not present

## 2014-07-21 DIAGNOSIS — C4442 Squamous cell carcinoma of skin of scalp and neck: Secondary | ICD-10-CM

## 2014-07-21 MED ORDER — SILVER SULFADIAZINE 1 % EX CREA
TOPICAL_CREAM | Freq: Two times a day (BID) | CUTANEOUS | Status: DC
  Administered 2014-07-21: 16:00:00 via TOPICAL

## 2014-07-21 NOTE — Progress Notes (Signed)
Weekly Management Note:  Site: Scalp Current Dose:  5200  cGy Projected Dose: 6000  cGy  Narrative: The patient is seen today for routine under treatment assessment. CBCT/MVCT images/port films were reviewed. The chart was reviewed.   He is having more scalp discomfort. This is somewhat relieved by Biafine that he uses twice a day. He does have a mild headache.  Physical Examination:  Filed Vitals:   07/21/14 1541  BP: 146/46  Pulse: 68  Temp: 99.5 F (37.5 C)  Resp: 20  .  Weight:  . There is marked erythema along the scalp within his treatment field. The 3 small areas of moist desquamation measuring no greater than 2 cm in size. There is slight clear drainage.  Impression: Tolerating radiation therapy well, however he does have focal moist desquamation and I'll start Silvadene cream twice a day. He will stop Biafine for the time being. I will check his skin again this Wednesday or Thursday.  Plan: Continue radiation therapy as planned. He will finish treatment this Friday and I'll see him for a followup visit next week.

## 2014-07-21 NOTE — Progress Notes (Addendum)
Patient c/o headache and nausea he states is "from my radiation". He states he has had HA a few times following treatment, does not take any medication for this, states it "goes away on it's own". Pt applying Biafine to scalp, hyperpigmentation and a few scattered red crusted areas noted, small areas of desquamation.  He is fatigued, denies loss of appetite. Pt will complete treatment on Friday. Gave pt Silvadene 1% 50 gm jar per Dr Valere Dross. Spoke w/DJ, LPN at [facility], [place] and informed her of Dr Charlton Amor new order of Silvadene cream. Informed her that Silvadene cream was applied to pt's scalp prior to him leaving cancer center; requested she apply a second thin layer tonight. Informed DJ that per Dr Valere Dross, patient is to have Silvadene  Cream applied twice daily to scalp area that is red, 8 am and 8 pm. DJ verbalized understanding.

## 2014-07-22 ENCOUNTER — Non-Acute Institutional Stay (SKILLED_NURSING_FACILITY): Payer: Medicare Other | Admitting: Internal Medicine

## 2014-07-22 ENCOUNTER — Ambulatory Visit
Admission: RE | Admit: 2014-07-22 | Discharge: 2014-07-22 | Disposition: A | Discharge status: Home / Self Care | Payer: Medicare Other | Source: Ambulatory Visit | Attending: Radiation Oncology | Admitting: Radiation Oncology

## 2014-07-22 DIAGNOSIS — C4442 Squamous cell carcinoma of skin of scalp and neck: Secondary | ICD-10-CM

## 2014-07-22 DIAGNOSIS — Z89611 Acquired absence of right leg above knee: Secondary | ICD-10-CM

## 2014-07-22 DIAGNOSIS — Z510 Encounter for antineoplastic radiation therapy: Secondary | ICD-10-CM | POA: Diagnosis not present

## 2014-07-22 DIAGNOSIS — S78119A Complete traumatic amputation at level between unspecified hip and knee, initial encounter: Secondary | ICD-10-CM

## 2014-07-22 DIAGNOSIS — E1159 Type 2 diabetes mellitus with other circulatory complications: Secondary | ICD-10-CM

## 2014-07-23 ENCOUNTER — Ambulatory Visit
Admission: RE | Admit: 2014-07-23 | Discharge: 2014-07-23 | Disposition: A | Discharge status: Home / Self Care | Payer: Medicare Other | Source: Ambulatory Visit | Attending: Radiation Oncology | Admitting: Radiation Oncology

## 2014-07-23 DIAGNOSIS — Z510 Encounter for antineoplastic radiation therapy: Secondary | ICD-10-CM | POA: Diagnosis not present

## 2014-07-24 ENCOUNTER — Ambulatory Visit
Admission: RE | Admit: 2014-07-24 | Discharge: 2014-07-24 | Disposition: A | Discharge status: Home / Self Care | Payer: Medicare Other | Source: Ambulatory Visit | Attending: Radiation Oncology | Admitting: Radiation Oncology

## 2014-07-24 DIAGNOSIS — Z510 Encounter for antineoplastic radiation therapy: Secondary | ICD-10-CM | POA: Diagnosis not present

## 2014-07-25 ENCOUNTER — Other Ambulatory Visit: Payer: Self-pay | Admitting: Internal Medicine

## 2014-07-25 ENCOUNTER — Ambulatory Visit
Admission: RE | Admit: 2014-07-25 | Discharge: 2014-07-25 | Disposition: A | Discharge status: Home / Self Care | Payer: Medicare Other | Source: Ambulatory Visit | Attending: Radiation Oncology | Admitting: Radiation Oncology

## 2014-07-25 DIAGNOSIS — Z510 Encounter for antineoplastic radiation therapy: Secondary | ICD-10-CM | POA: Diagnosis not present

## 2014-07-25 LAB — HEMOGLOBIN: Hemoglobin: 10.8 g/dL — ABNORMAL LOW (ref 13.0–17.0)

## 2014-07-25 LAB — HEMATOCRIT: HCT: 31.9 % — ABNORMAL LOW (ref 39.0–52.0)

## 2014-07-26 ENCOUNTER — Encounter: Payer: Self-pay | Admitting: Internal Medicine

## 2014-07-26 MED ORDER — INSULIN ASPART 100 UNIT/ML FLEXPEN
8.0000 [IU] | PEN_INJECTOR | Freq: Three times a day (TID) | SUBCUTANEOUS | Status: DC
Start: 2014-07-26 — End: 2014-11-10

## 2014-07-26 MED ORDER — GLYBURIDE 2.5 MG PO TABS: 2.5000 mg | ORAL_TABLET | Freq: Every day | ORAL | Status: DC

## 2014-07-26 NOTE — Progress Notes (Signed)
Patient ID: [name], male   DOB: 1935/06/20, 80 y.o.   MRN: [id number]  [facility] Provider:  Jonelle Sidle L. Mariea Clonts, D.O., C.M.D.  Chief Complaint  Patient presents with  . Acute Visit    persistently elevated sugars    HPI:  79 yo male here for rehab after LE amputation and currently receiving XRT for skin cancer on his scalp was seen due to concerns about persistent elevation of his glucose despite the appropriate dietary changes he has made including skipping foods he knows are culprits that come on the consistent carb tray.  He is nearly finished with his XRT (will be done 9/4).    Review of Systems:  Review of Systems  Constitutional: Negative for malaise/fatigue.  Gastrointestinal: Negative for nausea and vomiting.  Skin:       Getting XRT on scalp  Neurological: Negative for dizziness, loss of consciousness, weakness and headaches.    Medications: Patient's Medications  New Prescriptions   No medications on file  Previous Medications   ACCU-CHEK SMARTVIEW TEST STRIP       APIXABAN (ELIQUIS) 5 MG TABS TABLET    Take 5 mg by mouth daily.   ASSURE COMFORT LANCETS 30G MISC       BLOOD GLUCOSE CALIBRATION (ACCU-CHEK SMARTVIEW CONTROL) LIQD       DIGOXIN (LANOXIN) 0.125 MG TABLET    Take 0.125 mg by mouth daily.   DILTIAZEM (CARDIZEM CD) 180 MG 24 HR CAPSULE    Take 1 capsule (180 mg total) by mouth daily.   FERROUS FUMARATE (HEMOCYTE - 106 MG FE) 325 (106 FE) MG TABS TABLET    Take 1 tablet (106 mg of iron total) by mouth daily at 12 noon.   FUROSEMIDE (LASIX) 40 MG TABLET    Take 40 mg by mouth daily.    GABAPENTIN (NEURONTIN) 100 MG CAPSULE    Take 1 capsule (100 mg total) by mouth every 12 (twelve) hours.   INSULIN ASPART (NOVOLOG FLEXPEN) 100 UNIT/ML FLEXPEN    Inject 0-10 Units into the skin 3 (three) times daily with meals. Per sliding scale:  cbg 150-250 5 units, 251-300 8 units, 301-500 10 units (call MD if over 350)   INSULIN GLARGINE (LANTUS) 100  UNIT/ML SOLOSTAR PEN    Inject 24 Units into the skin at bedtime.   INSULIN SYRINGE-NEEDLE U-100 (INSULIN SYRINGE .3CC/31GX5/16") 31G X 5/16" 0.3 ML MISC    0.3 mL 31 x 5/16"   LISINOPRIL (PRINIVIL,ZESTRIL) 10 MG TABLET    Take 10 mg by mouth daily.   LORAZEPAM (ATIVAN) 0.5 MG TABLET    Take 0.5 tablets (0.25 mg total) by mouth 2 (two) times daily as needed for anxiety.   MELATONIN 5 MG TABS    Take 5 mg by mouth at bedtime as needed (insomnia).   METHOCARBAMOL (ROBAXIN) 500 MG TABLET    Take 1 tablet (500 mg total) by mouth 3 (three) times daily.   MULTIPLE VITAMINS-MINERALS (DECUBI-VITE) CAPS    Take 1 capsule by mouth daily.    NUTRITIONAL SUPPLEMENTS (FEEDING SUPPLEMENT, GLUCERNA 1.2 CAL,) LIQD    Place 1,000 mLs into feeding tube continuous.   OMEPRAZOLE (PRILOSEC) 20 MG CAPSULE    Take 20 mg by mouth 2 (two) times daily before a meal.   OXYCODONE-ACETAMINOPHEN (PERCOCET) 10-325 MG PER TABLET    Take one tablet by mouth every 8 hours for pain   POLYETHYLENE GLYCOL (MIRALAX / GLYCOLAX) PACKET    Take 17 g by  mouth daily as needed.   SENNA-DOCUSATE (SENNA-S) 8.6-50 MG PER TABLET    Take 1 tablet by mouth daily.   SILVER SULFADIAZINE (SILVADENE) 1 % CREAM    Apply 1 application topically 2 (two) times daily.  Modified Medications   No medications on file  Discontinued Medications   No medications on file    Physical Exam: Filed Vitals:   07/22/14 1757  BP: 125/98  Pulse: 75  Temp: 97.7 F (36.5 C)  Resp: 18  Height: 5\' 10"  (1.778 m)  Weight: 166 lb (75.297 kg)  Physical Exam  Constitutional: He is oriented to person, place, and time. He appears well-developed and well-nourished. No distress.  HENT:  Head: Normocephalic and atraumatic.  Cardiovascular: Normal rate, regular rhythm and normal heart sounds.   Pulmonary/Chest: Effort normal and breath sounds normal.  Musculoskeletal:  amputation  Neurological: He is alert and oriented to person, place, and time.  Skin:  Redness  and scaling of his scalp  Psychiatric: He has a normal mood and affect.    Labs reviewed: Basic Metabolic Panel:  Recent Labs  03/19/14 0543 03/20/14 0645 04/02/14 0835  NA 141 137 138  K 4.1 4.1 4.9  CL 103 98 100  CO2 26 26 26   GLUCOSE 106* 95 150*  BUN 25* 25* 34*  CREATININE 1.40* 1.50* 1.44*  CALCIUM 8.8 8.7 9.2    Liver Function Tests:  Recent Labs  03/02/14 0710 03/15/14 2019 03/16/14 0640  AST 12 13 12   ALT 7 10 9   ALKPHOS 95 122* 98  BILITOT 0.9 0.4 0.4  PROT 6.4 6.1 5.5*  ALBUMIN 2.6* 2.3* 2.2*    CBC:  Recent Labs  04/03/14 0550 04/04/14 0430 04/05/14 0615 05/12/14 06/06/14  WBC 9.2 7.8 7.4 7.4  --   NEUTROABS 6.4 5.1 4.5  --   --   HGB 9.2* 8.2* 8.3*  --  9.9*  HCT 28.3* 24.6* 25.4*  --  29*  MCV 85.2 87.5 86.1  --   --   PLT 232 184 183 198  --     Assessment/Plan 1. Type II or unspecified type diabetes mellitus with peripheral circulatory disorders, not stated as uncontrolled(250.70) -he is currently on tradjenta 5mg  since last week, lantus 24 units since 8/11 and novolog 5 units 150-250, 8 units 251-300 and 10 units 301-500 -will d/c novolog current orders -novolog 8 units unless cbg<100, glyburide 2mg  with breakfast, cont lantus 24 units and tradjenta 5mg  -monitor for hypoglycemia 2. 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 S/P AKA (above knee amputation) unilateral, right -cont therapy for walking with prosthesis with goal to return home  Family/ staff Communication: seen with unit supervisor  Goals of care: plans to complete rehab, start walking with prosthetic and go home to his apt

## 2014-07-26 NOTE — Progress Notes (Signed)
This encounter was created in error - please disregard.

## 2014-07-28 ENCOUNTER — Encounter: Payer: Self-pay | Admitting: Radiation Oncology

## 2014-07-28 NOTE — Progress Notes (Addendum)
[facility] End of Treatment Note  Name:[name]  Date: 07/28/2014 PRF:[id number] DOB:1935-11-01   Status:outpatient    CC: Georgetta Haber, MD  Dr. Rolm Bookbinder  REFERRING PHYSICIAN: Dr. Rolm Bookbinder   DIAGNOSIS:  Multiple squamous cell carcinomas, both invasive and noninvasive involving his scalp   INDICATION FOR TREATMENT: Curative   TREATMENT DATES: 06/16/2014 through 07/25/2014                          SITE/DOSE:   Scalp 6000 cGy in 30 sessions                         BEAMS/ENERGY:     6 MEV electrons with 0.8 cm bolus to maximize the dose to the skin surface.              NARRATIVE:   The patient tolerated treatment well with focal areas of moist desquamation along his carcinomas during his last 2 weeks of therapy.  He was treated with Biafine cream.                    PLAN: Routine followup in one month. Patient instructed to call if questions or worsening complaints in interim.

## 2014-07-30 ENCOUNTER — Encounter: Payer: Self-pay | Admitting: *Deleted

## 2014-07-31 ENCOUNTER — Ambulatory Visit
Admission: RE | Admit: 2014-07-31 | Discharge: 2014-07-31 | Disposition: A | Discharge status: Home / Self Care | Payer: Medicare Other | Source: Ambulatory Visit | Attending: Radiation Oncology | Admitting: Radiation Oncology

## 2014-07-31 ENCOUNTER — Encounter: Payer: Self-pay | Admitting: Radiation Oncology

## 2014-07-31 VITALS — BP 124/47 | HR 57 | Temp 98.6°F

## 2014-07-31 DIAGNOSIS — C4442 Squamous cell carcinoma of skin of scalp and neck: Secondary | ICD-10-CM

## 2014-07-31 NOTE — Progress Notes (Signed)
[name]  Is here today for assessment s/p treatment for squamous cell carcinoma of the scalp.  The area is moist, and reddened with yellowish exudate on the scalp.  He reports it is tender to the touch.  Using Slivadene BID.

## 2014-08-01 NOTE — Progress Notes (Signed)
CC: Dr. Rolm Bookbinder   Followup note:  The patient return for a brief followup yesterday for a skin check having finished his electron beam radiation therapy one week ago. He has been using Silvadene cream along his scalp twice a day. He is without complaints today.  On examination of his scalp,  he has multiple patchy areas of granulating tissue at the site of his previous carcinomas.  All areas appear to be clean without evidence for infection.  Impression: Satisfactory progress. He is to continue with his Silvadene cream.  Plan: Followup visit to see me in 2 weeks.

## 2014-08-04 ENCOUNTER — Other Ambulatory Visit: Payer: Self-pay | Admitting: Internal Medicine

## 2014-08-04 LAB — HEMOGLOBIN AND HEMATOCRIT, BLOOD
HCT: 31.2 % — ABNORMAL LOW (ref 39.0–52.0)
Hemoglobin: 10.5 g/dL — ABNORMAL LOW (ref 13.0–17.0)

## 2014-08-07 ENCOUNTER — Telehealth: Payer: Self-pay | Admitting: *Deleted

## 2014-08-07 NOTE — Telephone Encounter (Signed)
Patient called again stating he wanted to get in his record that when he applies Silvadene in the daytime he gets "an excruciating headache for about 15 minutes then it goes away." He states he does not get a headache when he applies it at night, but stated that may be because he goes to sleep. Pt states he does not take Tylenol and does not want "to take any more medications". Patient again stated that his scalp "looks good and is healing". Advised patient to discontinue daytime application until he sees Dr Valere Dross. He stated he did not want to stop Silvadene but wanted this on his record. Informed him this RN would document.

## 2014-08-07 NOTE — Telephone Encounter (Signed)
Received call from patient stating when he applies Silvadene to scalp during the day "it ends up running down my forehead". He states at night it stays on well. He is asking if he may only apply at night. He states "It looks good." Pt has FU w/Dr Valere Dross on 08/12/14. Advised he continue to apply twice daily but put very light coat on in the mornings. He verbalized understanding, agreement.

## 2014-08-12 ENCOUNTER — Encounter: Payer: Self-pay | Admitting: Radiation Oncology

## 2014-08-12 ENCOUNTER — Ambulatory Visit
Admission: RE | Admit: 2014-08-12 | Discharge: 2014-08-12 | Disposition: A | Discharge status: Home / Self Care | Payer: Medicare Other | Source: Ambulatory Visit | Attending: Radiation Oncology | Admitting: Radiation Oncology

## 2014-08-12 VITALS — BP 137/53 | HR 63 | Temp 98.1°F | Resp 18

## 2014-08-12 DIAGNOSIS — C4442 Squamous cell carcinoma of skin of scalp and neck: Secondary | ICD-10-CM

## 2014-08-12 NOTE — Progress Notes (Signed)
CC: Dr. Rolm Bookbinder  Followup note:  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 He continues to use Silvadene cream twice a day as prescribed. This causes temporary discomfort with minimal bleeding, but soon afterwards he is asymptomatic. He is on Eliquis.  Physical examination: Alert and cheerful. Filed Vitals:   08/12/14 1607  BP: 137/53  Pulse: 63  Temp: 98.1 F (36.7 C)  Resp: 18   On inspection of his scalp there is diffuse mild erythema. There is no evidence for residual carcinoma. There are 3 areas of superficial granulating tissue, one along the right frontal region measuring 5 x 1 cm, another along the left frontal region and measuring 1 x 1 cm, and the other along the posterior scalp measuring 2.5 x 0.5 cm. All wounds are superficial and clean.  Impression: Satisfactory progress.  Plan: Continue with Silvadene cream twice a day and followup visit with me in 2 weeks.

## 2014-08-12 NOTE — Progress Notes (Signed)
Follow up skin check on patient 's scalp, shiny,tight skin, thinning, 3 areas of scabbed, he uses silvadene lightly during the day, after about 10 minutes, scalp starts to bleed  Slightly from 2 of the skin areas, and has a painful  headache for about 10 minutes then resolves, and at night a thicker application of the silvadene cream  at night, again bleeding and painful headache for 10 minutes and then resolves, patient feels is getting better,appetite is good 4:12 PM

## 2014-08-23 ENCOUNTER — Encounter: Payer: Self-pay | Admitting: Internal Medicine

## 2014-08-25 ENCOUNTER — Encounter: Payer: Medicare Other | Admitting: Internal Medicine

## 2014-08-26 ENCOUNTER — Ambulatory Visit
Admission: RE | Admit: 2014-08-26 | Discharge: 2014-08-26 | Disposition: A | Discharge status: Home / Self Care | Payer: Medicare Other | Source: Ambulatory Visit | Attending: Radiation Oncology | Admitting: Radiation Oncology

## 2014-08-26 ENCOUNTER — Encounter: Payer: Self-pay | Admitting: Radiation Oncology

## 2014-08-26 VITALS — BP 150/65 | HR 89 | Resp 18 | Wt 161.0 lb

## 2014-08-26 DIAGNOSIS — C4442 Squamous cell carcinoma of skin of scalp and neck: Secondary | ICD-10-CM

## 2014-08-26 NOTE — Progress Notes (Signed)
The patient's scalp has healed completely. There are no open wounds noted on the patient's scalp. He reports that he continues to use SSD daily. Reports he experiences a brief headache following application of SSD that resolves on its own. Denies nausea, vomiting, or dizziness. Weight and vitals stable. Denies pain. Reports he received his right leg prothesis but, has since had to stop wearing it because he developed a sore. He explains the sore on his stump is almost healed.

## 2014-08-26 NOTE — Progress Notes (Signed)
CC: Dr. Rolm Bookbinder  Followup note:  [name] returns today approximately 1 month following completion of electron beam radiotherapy to his scalp in the management of his multiple squamous cell carcinomas. He is pleased with his progress. He has been using Silvadene cream twice a day as prescribed. He no longer has any scalp discomfort.  Physical examination: Alert and oriented. There is one area of remaining superficial granulating tissue along the right frontal region now measuring 2.0 x 0. 5 cm (previously measuring 5 x 1 cm). The other 2 areas have completely reepithelialized. His scalp looks great and is without evidence for persistent disease within his treatment field. There is an small area of keratosis along his left frontal region (outside his treatment field) which bears attention when he sees Dr. Ubaldo Glassing for future followup visit. 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  Impression: Satisfactory progress.  Plan: I am releasing Mr. Gootee to Dr. Ubaldo Glassing and can see him late this year.

## 2014-08-28 ENCOUNTER — Other Ambulatory Visit: Payer: Self-pay | Admitting: *Deleted

## 2014-08-28 ENCOUNTER — Encounter: Payer: Self-pay | Admitting: Internal Medicine

## 2014-08-28 ENCOUNTER — Ambulatory Visit (INDEPENDENT_AMBULATORY_CARE_PROVIDER_SITE_OTHER): Payer: Medicare Other | Admitting: Internal Medicine

## 2014-08-28 VITALS — BP 98/42 | HR 64 | Ht 70.0 in

## 2014-08-28 DIAGNOSIS — I482 Chronic atrial fibrillation, unspecified: Secondary | ICD-10-CM

## 2014-08-28 DIAGNOSIS — Z950 Presence of cardiac pacemaker: Secondary | ICD-10-CM

## 2014-08-28 LAB — MDC_IDC_ENUM_SESS_TYPE_INCLINIC
Battery Remaining Longevity: 120 mo
Battery Voltage: 2.95 V
Brady Statistic RV Percent Paced: 8.2 %
Implantable Pulse Generator Serial Number: 7244744
Lead Channel Impedance Value: 637.5 Ohm
Lead Channel Pacing Threshold Pulse Width: 0.4 ms
Lead Channel Setting Pacing Amplitude: 2 V
Lead Channel Setting Pacing Pulse Width: 0.4 ms
MDC IDC MSMT LEADCHNL RV PACING THRESHOLD AMPLITUDE: 0.5 V
MDC IDC MSMT LEADCHNL RV PACING THRESHOLD AMPLITUDE: 0.5 V
MDC IDC MSMT LEADCHNL RV PACING THRESHOLD PULSEWIDTH: 0.4 ms
MDC IDC MSMT LEADCHNL RV SENSING INTR AMPL: 12 mV
MDC IDC SESS DTM: 20151008134715
MDC IDC SET LEADCHNL RV SENSING SENSITIVITY: 2 mV

## 2014-08-28 LAB — BASIC METABOLIC PANEL
BUN: 43 mg/dL — ABNORMAL HIGH (ref 6–23)
CALCIUM: 9.1 mg/dL (ref 8.4–10.5)
CO2: 26 meq/L (ref 19–32)
Chloride: 102 mEq/L (ref 96–112)
Creatinine, Ser: 1.7 mg/dL — ABNORMAL HIGH (ref 0.4–1.5)
GFR: 41.16 mL/min — AB (ref >60.00)
Glucose, Bld: 154 mg/dL — ABNORMAL HIGH (ref 70–99)
POTASSIUM: 4.4 meq/L (ref 3.5–5.1)
Sodium: 135 mEq/L (ref 135–145)

## 2014-08-28 NOTE — Patient Instructions (Signed)
Your physician recommends that you continue on your current medications as directed. Please refer to the Current Medication list given to you today.  Labs today: BMET, Digoxin level  Your physician wants you to follow-up in: 1 year with Dr. Caryl Comes.  You will receive a reminder letter in the mail two months in advance. If you don't receive a letter, please call our office to schedule the follow-up appointment.

## 2014-08-28 NOTE — Progress Notes (Signed)
Patient Care Team: Ricard Dillon, MD as PCP - General Ricard Dillon, MD as Consulting Physician (Internal Medicine) Deboraha Sprang, MD as Consulting Physician (Cardiology) Thompson Grayer, MD as Consulting Physician (Cardiology) [name], MD as Consulting Physician (Orthopedic Surgery)   HPI  [name] is a 79 y.o. male Seen in followup for pacemaker implanted 2012   He has atrial fibrillation and had a stroke/TIA 22,014  He is doing relatively well without complaints of significant SOB    He si s./p  BKA    Past Medical History  Diagnosis Date  . Fatigue   . Overweight(278.02)   . Syncope and collapse   . GERD (gastroesophageal reflux disease)   . Right bundle branch block   . Unspecified venous (peripheral) insufficiency   . Phlebitis and thrombophlebitis of superficial vessels of lower extremities   . Cellulitis and abscess of leg, except foot   . Backache, unspecified   . Long term (current) use of anticoagulants   . Type II or unspecified type diabetes mellitus without mention of complication, not stated as uncontrolled   . Unspecified venous (peripheral) insufficiency   . Hypertension   . Gout, unspecified   . Heart failure   . Atrial fibrillation     permanent  . Esophageal stricture   . Hx of colonic polyps 05-2006    (Adenomatous)Dr. [name]  . Diverticulosis 05-2006    Dr. [name]   . Tachycardia-bradycardia     s/p PPM  . Pacemaker- [name] 02/20/2013  . CHF (congestive heart failure)   . Skin cancer     squamous cell carcinoma scalp  . Hx of radiation therapy 06/16/14- 07/18/14    mult squamous cell carcinomas of scalp    Past Surgical History  Procedure Laterality Date  . Laminectomy    . Lumbar fusion    . Rotator cuff repair    . Ruptured rt rectus muscle    . Eye surgery    . Pacemaker insertion  8/12    SJM by [name] for tachy/brady syndrome  . I&d extremity Right 01/17/2014    Procedure: IRRIGATION AND DEBRIDEMENT RIGHT HEEL   WITH CULTURES AND BONE BIOPSY, placement of wound vac;  Surgeon: Theodoro Kos, DO;  Location: [facility];  Service: Plastics;  Laterality: Right;  . Amputation Right 01/20/2014    Procedure: AMPUTATION BELOW KNEE- right;  Surgeon: [name], MD;  Location: [facility];  Service: Orthopedics;  Laterality: Right;  Right Below Knee Amputation  . Amputation Right 02/14/2014    Procedure: AMPUTATION BELOW KNEE;  Surgeon: [name], MD;  Location: [facility];  Service: Orthopedics;  Laterality: Right;  Right Below Knee Amputation Revision  . Amputation Right 03/07/2014    Procedure: AMPUTATION BELOW KNEE;  Surgeon: [name], MD;  Location: [facility];  Service: Orthopedics;  Laterality: Right;  Right Below Knee Amputation Revision  . Amputation Right 03/18/2014    Procedure: revision of  AMPUTATION BELOW KNEE;  Surgeon: [name], MD;  Location: [facility];  Service: Orthopedics;  Laterality: Right;  Revision Right Below Knee Amputation  . Above knee leg amputation Right 04/02/2014    DR DUDA  . Amputation Right 04/02/2014    Procedure: AMPUTATION ABOVE KNEE;  Surgeon: [name], MD;  Location: [facility];  Service: Orthopedics;  Laterality: Right;  Right Above Knee Amputation  . Skin biopsy      scalp    Current Outpatient Prescriptions  Medication Sig Dispense Refill  . ACCU-CHEK SMARTVIEW test strip       . apixaban (ELIQUIS) 5 MG TABS tablet Take 5 mg by mouth daily.      . ASSURE COMFORT LANCETS 30G MISC       . Blood Glucose Calibration (ACCU-CHEK SMARTVIEW CONTROL) LIQD       . digoxin (LANOXIN) 0.125 MG tablet Take 0.125 mg by mouth daily.      Marland Kitchen diltiazem (CARDIZEM CD) 180 MG 24 hr capsule Take 1 capsule (180 mg total) by mouth daily.  30 capsule  6  . ferrous fumarate (HEMOCYTE - 106 MG FE) 325 (106 FE) MG TABS tablet Take 1 tablet (106 mg of iron total) by mouth daily at 12 noon.  30 each  0  . furosemide (LASIX) 40 MG tablet Take 40 mg by mouth daily.       Marland Kitchen gabapentin (NEURONTIN) 100 MG capsule  Take 1 capsule (100 mg total) by mouth every 12 (twelve) hours.  90 capsule  3  . glimepiride (AMARYL) 2 MG tablet       . insulin aspart (NOVOLOG FLEXPEN) 100 UNIT/ML FlexPen Inject 8 Units into the skin 3 (three) times daily with meals. Hold if cbg<100  15 mL  3  . Insulin Glargine (LANTUS) 100 UNIT/ML Solostar Pen Inject 24 Units into the skin at bedtime.      . Insulin Syringe-Needle U-100 (INSULIN SYRINGE .3CC/31GX5/16") 31G X 5/16" 0.3 ML MISC 0.3 mL 31 x 5/16"      . linagliptin (TRADJENTA) 5 MG TABS tablet Take 5 mg by mouth daily.      Marland Kitchen lisinopril (PRINIVIL,ZESTRIL) 10 MG tablet Take 10 mg by mouth daily.      Marland Kitchen LORazepam (ATIVAN) 0.5 MG tablet Take 0.5 tablets (0.25 mg total) by mouth 2 (two) times daily as needed for anxiety.  30 tablet  5  . Melatonin 5 MG TABS Take 5 mg by mouth at bedtime as needed (insomnia).      . methocarbamol (ROBAXIN) 500 MG tablet Take 1 tablet (500 mg total) by mouth 3 (three) times daily.  30 tablet  0  . Multiple Vitamins-Minerals (DECUBI-VITE) CAPS Take 1 capsule by mouth daily.       . Nutritional Supplements (FEEDING SUPPLEMENT, GLUCERNA 1.2 CAL,) LIQD Take 1,000 mLs by mouth daily.       Marland Kitchen omeprazole (PRILOSEC) 20 MG capsule Take 20 mg by mouth 2 (two) times daily before a meal.      . Ostomy Supplies (SKIN PREP WIPES) MISC Apply 1 application topically daily.      Marland Kitchen oxyCODONE-acetaminophen (PERCOCET) 10-325 MG per tablet Take one tablet by mouth every 8 hours for pain  90 tablet  0  . polyethylene glycol (MIRALAX / GLYCOLAX) packet Take 17 g by mouth daily as needed.      . senna-docusate (SENNA-S) 8.6-50 MG per tablet Take 1 tablet by mouth daily.      . silver sulfADIAZINE (SILVADENE) 1 % cream Apply 1 application topically 2 (two) times daily.      . Skin Protectants, Misc. (CALAZIME SKIN PROTECTANT EX) Apply 1 application topically daily.       No current facility-administered medications for this visit.    Allergies  Allergen Reactions  .  Morphine Rash and Other (See Comments)    Irritability also  . Sulfate     Review of Systems negative except from HPI and PMH  Physical Exam BP 98/42  Pulse 64  Ht 5\' 10"  (1.778 m) Well developed and nourished in no acute distress HENT normal Neck supple with JVP-flat Clear Irregular rate and rhythm, no murmurs or gallops Abd-soft with active BS No Clubbing cyanosis edema Skin-warm and dry A & Oriented  Grossly normal sensory and motor function    Assessment and  Plan  Atrial fib  Renal insufficiency grade 3 ( Cr 1.44--5/15)  Pacemaker  The patient's device was interrogated.  The information was reviewed. No changes were made in the programming.     We'll check digoxin level and metabolic profile

## 2014-08-29 LAB — DIGOXIN LEVEL: DIGOXIN LVL: 1.3 ng/mL (ref 0.8–2.0)

## 2014-09-02 ENCOUNTER — Other Ambulatory Visit: Payer: Self-pay | Admitting: *Deleted

## 2014-09-02 MED ORDER — OXYCODONE-ACETAMINOPHEN 10-325 MG PO TABS: ORAL_TABLET | ORAL | Status: DC

## 2014-09-02 NOTE — Telephone Encounter (Signed)
[facility] 

## 2014-09-09 ENCOUNTER — Non-Acute Institutional Stay (SKILLED_NURSING_FACILITY): Payer: Medicare Other | Admitting: Internal Medicine

## 2014-09-09 DIAGNOSIS — K219 Gastro-esophageal reflux disease without esophagitis: Secondary | ICD-10-CM

## 2014-09-09 DIAGNOSIS — I482 Chronic atrial fibrillation, unspecified: Secondary | ICD-10-CM

## 2014-09-09 DIAGNOSIS — E1165 Type 2 diabetes mellitus with hyperglycemia: Secondary | ICD-10-CM

## 2014-09-09 DIAGNOSIS — I10 Essential (primary) hypertension: Secondary | ICD-10-CM

## 2014-09-09 DIAGNOSIS — E1129 Type 2 diabetes mellitus with other diabetic kidney complication: Secondary | ICD-10-CM

## 2014-09-09 DIAGNOSIS — I5032 Chronic diastolic (congestive) heart failure: Secondary | ICD-10-CM

## 2014-09-09 DIAGNOSIS — IMO0002 Reserved for concepts with insufficient information to code with codable children: Secondary | ICD-10-CM

## 2014-09-09 NOTE — Progress Notes (Signed)
Patient ID: [name], male   DOB: [birth year], 79 y.o.   MRN: [id number]   Place of Service: [facility]  Allergies  Allergen Reactions   Morphine Rash and Other (See Comments)    Irritability also   Sulfate     Code Status: DNR  Goals of Care: Comfort and Quality of Life/Short Term Rehab  Chief Complaint  Patient presents with   Medical Management of Chronic Issues    weight gain, afib, dm2, htn, insomnia    HPI 79 y.o. male with PMH of afib, DM2 s/p R BKA, htn, insomnia among others is being seen for a routine visit for management of his chronic condition and change in weight at the request of nursing staff. No other concerns reported from nursing staff. Patient denies any complaint. Reports appetite is good, sleeps well, no change in bowel habit. No bleeding issues. Works well with therapy.   Review of Systems Constitutional: Positive for weight gain (1lb). No fever, chills, weakness, or fatigue. HEENT: No dizziness. No vision or hearing loss. No eye pain, earache, runny nose, or sore throat. Positive for headache occasionally  Cardiovascular: No chest pain, chest discomfort, palpitations, or dependent edema. Respiratory: No cough, shortness of breath, or wheezing.  Gastrointestinal: No nausea or vomiting. No heartburn. No change in appetite. No diarrhea or constipation. No bowel incontinence. Genitourinary: No dysuria, frequency, or urgency. No hematuria. No urinary incontinence.  Musculoskeletal: No falls. No muscle pain, joint pain, or joint stiffness.  R BKA Neurological: No numbness or tingling.  No memory loss. Skin: No rashes or lesions.  Psychiatric: No change in personality or affect. No anxiety or depression.   Past Medical History  Diagnosis Date   Fatigue    Overweight(278.02)    Syncope and collapse    GERD (gastroesophageal reflux disease)    Right bundle branch block    Unspecified venous (peripheral) insufficiency     Phlebitis and thrombophlebitis of superficial vessels of lower extremities    Cellulitis and abscess of leg, except foot    Backache, unspecified    Long term (current) use of anticoagulants    Type II or unspecified type diabetes mellitus without mention of complication, not stated as uncontrolled    Unspecified venous (peripheral) insufficiency    Hypertension    Gout, unspecified    Heart failure    Atrial fibrillation     permanent   Esophageal stricture    Hx of colonic polyps 05-2006    (Adenomatous)Dr. [name]   Diverticulosis 05-2006    Dr. Murrell Converse     s/p PPM   Pacemaker- [name] 02/20/2013   CHF (congestive heart failure)    Skin cancer     squamous cell carcinoma scalp   Hx of radiation therapy 06/16/14- 07/18/14    mult squamous cell carcinomas of scalp    Past Surgical History  Procedure Laterality Date   Laminectomy     Lumbar fusion     Rotator cuff repair     Ruptured rt rectus muscle     Eye surgery     Pacemaker insertion  8/12    SJM by [name] for tachy/brady syndrome   I&d extremity Right 01/17/2014    Procedure: IRRIGATION AND DEBRIDEMENT RIGHT HEEL  WITH CULTURES AND BONE BIOPSY, placement of wound vac;  Surgeon: Theodoro Kos, DO;  Location: [facility];  Service: Plastics;  Laterality: Right;   Amputation Right 01/20/2014    Procedure: AMPUTATION BELOW  KNEE- right;  Surgeon: [name], MD;  Location: [facility];  Service: Orthopedics;  Laterality: Right;  Right Below Knee Amputation   Amputation Right 02/14/2014    Procedure: AMPUTATION BELOW KNEE;  Surgeon: [name], MD;  Location: [facility];  Service: Orthopedics;  Laterality: Right;  Right Below Knee Amputation Revision   Amputation Right 03/07/2014    Procedure: AMPUTATION BELOW KNEE;  Surgeon: [name], MD;  Location: [place];  Service: Orthopedics;  Laterality: Right;  Right Below Knee Amputation Revision   Amputation Right 03/18/2014    Procedure:  revision of  AMPUTATION BELOW KNEE;  Surgeon: [name], MD;  Location: [facility];  Service: Orthopedics;  Laterality: Right;  Revision Right Below Knee Amputation   Above knee leg amputation Right 04/02/2014    DR DUDA   Amputation Right 04/02/2014    Procedure: AMPUTATION ABOVE KNEE;  Surgeon: [name], MD;  Location: [facility];  Service: Orthopedics;  Laterality: Right;  Right Above Knee Amputation   Skin biopsy      scalp    History   Social History   Marital Status: Married    Spouse Name: [name]    Number of Children: 2   Years of Education: [name]   Occupational History   Retired     Social History Main Topics   Smoking status: Never Smoker    Smokeless tobacco: Never Used   Alcohol Use: No     Comment: two drinks daily - not while at [employer] Use: No   Sexual Activity: Not Currently   Other Topics Concern   Not on file   Social History Narrative   2 caffeine drinks daily       Medication List       This list is accurate as of: 09/09/14  1:46 PM.  Always use your most recent med list.               ACCU-CHEK SMARTVIEW CONTROL Liqd     ACCU-CHEK SMARTVIEW test strip  Generic drug:  glucose blood     ASSURE COMFORT LANCETS 30G Misc     CALAZIME SKIN PROTECTANT EX  Apply 1 application topically daily.     DECUBI-VITE Caps  Take 1 capsule by mouth daily.     digoxin 0.125 MG tablet  Commonly known as:  LANOXIN  Take 0.0625 mg by mouth daily.     diltiazem 180 MG 24 hr capsule  Commonly known as:  CARDIZEM CD  Take 1 capsule (180 mg total) by mouth daily.     ELIQUIS 5 MG Tabs tablet  Generic drug:  apixaban  Take 5 mg by mouth daily.     feeding supplement (GLUCERNA 1.2 CAL) Liqd  Take 1,000 mLs by mouth daily.     ferrous fumarate 325 (106 FE) MG Tabs tablet  Commonly known as:  HEMOCYTE - 106 mg FE  Take 1 tablet (106 mg of iron total) by mouth daily at 12 noon.     furosemide 40 MG tablet  Commonly known as:  LASIX    Take 20 mg by mouth every other day.     gabapentin 100 MG capsule  Commonly known as:  NEURONTIN  Take 1 capsule (100 mg total) by mouth every 12 (twelve) hours.     glimepiride 2 MG tablet  Commonly known as:  AMARYL     insulin aspart 100 UNIT/ML FlexPen  Commonly known as:  NOVOLOG  FLEXPEN  Inject 8 Units into the skin 3 (three) times daily with meals. Hold if cbg<100     Insulin Glargine 100 UNIT/ML Solostar Pen  Commonly known as:  LANTUS  Inject 24 Units into the skin at bedtime.     INSULIN SYRINGE .3CC/31GX5/16" 31G X 5/16" 0.3 ML Misc  0.3 mL 31 x 5/16"     linagliptin 5 MG Tabs tablet  Commonly known as:  TRADJENTA  Take 5 mg by mouth daily.     lisinopril 10 MG tablet  Commonly known as:  PRINIVIL,ZESTRIL  Take 10 mg by mouth daily.     LORazepam 0.5 MG tablet  Commonly known as:  ATIVAN  Take 0.5 tablets (0.25 mg total) by mouth 2 (two) times daily as needed for anxiety.     Melatonin 5 MG Tabs  Take 5 mg by mouth at bedtime as needed (insomnia).     methocarbamol 500 MG tablet  Commonly known as:  ROBAXIN  Take 1 tablet (500 mg total) by mouth 3 (three) times daily.     omeprazole 20 MG capsule  Commonly known as:  PRILOSEC  Take 20 mg by mouth 2 (two) times daily before a meal.     oxyCODONE-acetaminophen 10-325 MG per tablet  Commonly known as:  PERCOCET  Take one tablet by mouth every 8 hours for pain     polyethylene glycol packet  Commonly known as:  MIRALAX / GLYCOLAX  Take 17 g by mouth daily as needed.     SENNA-S 8.6-50 MG per tablet  Generic drug:  senna-docusate  Take 1 tablet by mouth daily.     silver sulfADIAZINE 1 % cream  Commonly known as:  SILVADENE  Apply 1 application topically 2 (two) times daily.     SKIN PREP WIPES Misc  Apply 1 application topically daily.        Physical Exam Filed Vitals:   09/09/14 1341  BP: 162/78  Pulse: 76  Temp: 97.9 F (36.6 C)  Resp: 20   Constitutional:  WDWN elderly male in no  acute distress. Appears stated age.  HEENT: Normocephalic and atraumatic. PERRL. EOM intact. No icterus. Vision grossly intact. External auditory canals patent, auricles without lesions.  Hearing grossly intact. No nasal discharge or sinus tenderness. Oral mucosa moist. Posterior pharynx clear of any exudate or lesions.  Neck: Supple and nontender. No lymphadenopathy, masses, or thyromegaly. No JVD or carotid bruits. Cardiac: Normal S1, S2.Regular rate Irregular rhythm without appreciable murmurs, rubs, or gallops. Left pedal pulse intact. No peripheral edema.  Lungs: Unlabored respiration. Breath sounds clear bilaterally without rales, rhonchi, or wheezing.  Abdomen: Audible bowel sounds in all quadrants. Soft, nontender, nondistended. No palpable mass.  Musculoskeletal: Extremities ROM intact. No joint erythema or tenderness. Seen in wheelchair Spine and Back: Normal spinal profile. No scoliosis. No tenderness over spines. No CVA tenderness.  Skin: Warm and dry.  Neurological: Alert and oriented to person, place, and time. No focal deficits Psychiatric: Judgment and insight adequate. Appropriate mood and affect.   Labs Reviewed 09/03/14: hbg 11.2, hct 32.8 08/29/14: dig level 0.9, na 139, k 4.3, cl 104, gluc 90, bun 40, cr. 1.6, ca 9.2 05/12/14: a1c 6.6  Assessment & Plan 1. Chronic atrial fibrillation Chronic. Rate control. No bleeding issues on Eliquis. Continue eliquis 5mg  daily and digoxin 0.0625mg  daily. Continue to monitor  2. Gastroesophageal reflux disease without esophagitis Stable. Last a1c 6.6. Continue omeprazole 20mg  twice daily. Continue to monitor. May consider decrease to  20mg  once daily if continues to be asymptomatic.   3. DM type 2, uncontrolled, with renal complications Stable. Continue Lantus 24 units SQ at bedtime, Novolog 8 units before meals-hold if CBG<100, glimeperide 2mg  daily-hold if cbg<70, and trajenta 5mg  daily. Continue to monitor his status  4. Uncontrolled  hypertension Stable. Mostly in 140s-150s. Continue lisinopril 10mg  daily, lasix 20mg  daily, and diltiazem ER 180mg  daily. Continue to monitor BPs  5. Chronic diastolic CHF (congestive heart failure) Euvolemic. No s&s of volume overload. Continue lasix 20mg  daily. Continue to monitor for change in patient status.   6. Weight change 1 lb gain. Continue to monitor for now.    Family/Staff Communication Plan of care discuss with resident and professional staff members. Resident and professional staff members verbalize understanding and agree with plan of care. No additional questions or concerns reported.    Arthur Holms, [facility] [address] [place], [place] [address] [phone] [8am-5pm]

## 2014-09-11 ENCOUNTER — Ambulatory Visit: Payer: Medicare Other | Admitting: Podiatry

## 2014-09-12 ENCOUNTER — Encounter: Payer: Self-pay | Admitting: Internal Medicine

## 2014-09-18 ENCOUNTER — Ambulatory Visit (INDEPENDENT_AMBULATORY_CARE_PROVIDER_SITE_OTHER): Payer: Medicare Other | Admitting: Podiatry

## 2014-09-18 ENCOUNTER — Encounter: Payer: Self-pay | Admitting: Podiatry

## 2014-09-18 DIAGNOSIS — M79673 Pain in unspecified foot: Secondary | ICD-10-CM

## 2014-09-18 DIAGNOSIS — B351 Tinea unguium: Secondary | ICD-10-CM

## 2014-09-18 NOTE — Patient Instructions (Signed)
Diabetes and Foot Care Diabetes may cause you to have problems because of poor blood supply (circulation) to your feet and legs. This may cause the skin on your feet to become thinner, break easier, and heal more slowly. Your skin may become dry, and the skin may peel and crack. You may also have nerve damage in your legs and feet causing decreased feeling in them. You may not notice minor injuries to your feet that could lead to infections or more serious problems. Taking care of your feet is one of the most important things you can do for yourself.  HOME CARE INSTRUCTIONS  Wear shoes at all times, even in the house. Do not go barefoot. Bare feet are easily injured.  Check your feet daily for blisters, cuts, and redness. If you cannot see the bottom of your feet, use a mirror or ask someone for help.  Wash your feet with warm water (do not use hot water) and mild soap. Then pat your feet and the areas between your toes until they are completely dry. Do not soak your feet as this can dry your skin.  Apply a moisturizing lotion or petroleum jelly (that does not contain alcohol and is unscented) to the skin on your feet and to dry, brittle toenails. Do not apply lotion between your toes.  Trim your toenails straight across. Do not dig under them or around the cuticle. File the edges of your nails with an emery board or nail file.  Do not cut corns or calluses or try to remove them with medicine.  Wear clean socks or stockings every day. Make sure they are not too tight. Do not wear knee-high stockings since they may decrease blood flow to your legs.  Wear shoes that fit properly and have enough cushioning. To break in new shoes, wear them for just a few hours a day. This prevents you from injuring your feet. Always look in your shoes before you put them on to be sure there are no objects inside.  Do not cross your legs. This may decrease the blood flow to your feet.  If you find a minor scrape,  cut, or break in the skin on your feet, keep it and the skin around it clean and dry. These areas may be cleansed with mild soap and water. Do not cleanse the area with peroxide, alcohol, or iodine.  When you remove an adhesive bandage, be sure not to damage the skin around it.  If you have a wound, look at it several times a day to make sure it is healing.  Do not use heating pads or hot water bottles. They may burn your skin. If you have lost feeling in your feet or legs, you may not know it is happening until it is too late.  Make sure your health care provider performs a complete foot exam at least annually or more often if you have foot problems. Report any cuts, sores, or bruises to your health care provider immediately. SEEK MEDICAL CARE IF:   You have an injury that is not healing.  You have cuts or breaks in the skin.  You have an ingrown nail.  You notice redness on your legs or feet.  You feel burning or tingling in your legs or feet.  You have pain or cramps in your legs and feet.  Your legs or feet are numb.  Your feet always feel cold. SEEK IMMEDIATE MEDICAL CARE IF:   There is increasing redness,   swelling, or pain in or around a wound.  There is a red line that goes up your leg.  Pus is coming from a wound.  You develop a fever or as directed by your health care provider.  You notice a bad smell coming from an ulcer or wound. Document Released: 11/04/2000 Document Revised: 07/10/2013 Document Reviewed: 04/16/2013 Exit[employer] Patient Information 2015 [employer]. This information is not intended to replace advice given to you by your health care provider. Make sure you discuss any questions you have with your health care provider.  

## 2014-09-22 NOTE — Progress Notes (Signed)
Subjective:     Patient ID: [name], male   DOB: September 19, 1935, 79 y.o.   MRN: [id number]  HPIpatient presents with nail disease 1-5 both feet that are thick yellow and brittle and painful   Review of Systems     Objective:   Physical Exam Neurovascular status intact with thick yellow brittle nailbeds 1-5 both feet that are painful when pressed    Assessment:     Mycotic nail infection with pain 1-5 both feet    Plan:     Debride painful nailbeds 1-5 both feet with no iatrogenic bleeding noted

## 2014-09-23 ENCOUNTER — Non-Acute Institutional Stay (SKILLED_NURSING_FACILITY): Payer: Medicare Other | Admitting: Internal Medicine

## 2014-09-23 ENCOUNTER — Encounter: Payer: Self-pay | Admitting: Internal Medicine

## 2014-09-23 ENCOUNTER — Other Ambulatory Visit: Payer: Self-pay | Admitting: *Deleted

## 2014-09-23 DIAGNOSIS — L259 Unspecified contact dermatitis, unspecified cause: Secondary | ICD-10-CM

## 2014-09-23 MED ORDER — OXYCODONE-ACETAMINOPHEN 10-325 MG PO TABS: ORAL_TABLET | ORAL | Status: DC

## 2014-09-23 NOTE — Progress Notes (Signed)
Patient ID: [name], male   DOB: [birth year], 79 y.o.   MRN: [id number]   Place of Service: [facility]  Allergies  Allergen Reactions   Morphine Rash and Other (See Comments)    Irritability also   Sulfate     Code Status: DNR  Goals of Care: Comfort and Quality of Life/Long term care  Chief Complaint  Patient presents with   Acute Visit    rash     HPI  79 y.o. male with PMH of afib, DM2, HTN, insomia among others is being seen for an acute visit at the request of nursing staff for the evaluation of rash. Red rash was noted where a band-aid was and where the resident wears his watch. No additional concerns reported from nursing staff.     Review of Systems Constitutional: Negative for fever, chills, and fatigue. Cardiovascular: Negative for chest pain, palpitations, and leg swelling Respiratory: Negative cough, shortness of breath, and wheezing.  Gastrointestinal: Negative for nausea and vomiting.  Neurological: Negative for dizziness and headache Skin: Positive for rash on both wrists. mild itching. Negative for pain   Past Medical History  Diagnosis Date   Fatigue    Overweight(278.02)    Syncope and collapse    GERD (gastroesophageal reflux disease)    Right bundle branch block    Unspecified venous (peripheral) insufficiency    Phlebitis and thrombophlebitis of superficial vessels of lower extremities    Cellulitis and abscess of leg, except foot    Backache, unspecified    Long term (current) use of anticoagulants    Type II or unspecified type diabetes mellitus without mention of complication, not stated as uncontrolled    Unspecified venous (peripheral) insufficiency    Hypertension    Gout, unspecified    Heart failure    Atrial fibrillation     permanent   Esophageal stricture    Hx of colonic polyps 05-2006    (Adenomatous)Dr. [name]   Diverticulosis 05-2006    Dr. Murrell Converse      s/p PPM   Pacemaker- [name] 02/20/2013   CHF (congestive heart failure)    Skin cancer     squamous cell carcinoma scalp   Hx of radiation therapy 06/16/14- 07/18/14    mult squamous cell carcinomas of scalp    Past Surgical History  Procedure Laterality Date   Laminectomy     Lumbar fusion     Rotator cuff repair     Ruptured rt rectus muscle     Eye surgery     Pacemaker insertion  8/12    SJM by [name] for tachy/brady syndrome   I&d extremity Right 01/17/2014    Procedure: IRRIGATION AND DEBRIDEMENT RIGHT HEEL  WITH CULTURES AND BONE BIOPSY, placement of wound vac;  Surgeon: Theodoro Kos, DO;  Location: [facility];  Service: Plastics;  Laterality: Right;   Amputation Right 01/20/2014    Procedure: AMPUTATION BELOW KNEE- right;  Surgeon: [name], MD;  Location: [place];  Service: Orthopedics;  Laterality: Right;  Right Below Knee Amputation   Amputation Right 02/14/2014    Procedure: AMPUTATION BELOW KNEE;  Surgeon: [name], MD;  Location: [place];  Service: Orthopedics;  Laterality: Right;  Right Below Knee Amputation Revision   Amputation Right 03/07/2014    Procedure: AMPUTATION BELOW KNEE;  Surgeon: [name], MD;  Location: [place];  Service: Orthopedics;  Laterality: Right;  Right Below Knee Amputation Revision  Amputation Right 03/18/2014    Procedure: revision of  AMPUTATION BELOW KNEE;  Surgeon: [name], MD;  Location: [place];  Service: Orthopedics;  Laterality: Right;  Revision Right Below Knee Amputation   Above knee leg amputation Right 04/02/2014    DR DUDA   Amputation Right 04/02/2014    Procedure: AMPUTATION ABOVE KNEE;  Surgeon: [name], MD;  Location: [place];  Service: Orthopedics;  Laterality: Right;  Right Above Knee Amputation   Skin biopsy      scalp    History   Social History   Marital Status: Married    Spouse Name: [name]    Number of Children: 2   Years of Education: [name]   Occupational History   Retired      Social History Main Topics   Smoking status: Never Smoker    Smokeless tobacco: Never Used   Alcohol Use: No     Comment: two drinks daily - not while at [employer] Use: No   Sexual Activity: Not Currently   Other Topics Concern   Not on file   Social History Narrative   2 caffeine drinks daily       Medication List       This list is accurate as of: 09/23/14 12:43 PM.  Always use your most recent med list.               ACCU-CHEK SMARTVIEW CONTROL Liqd     ACCU-CHEK SMARTVIEW test strip  Generic drug:  glucose blood     ASSURE COMFORT LANCETS 30G Misc     CALAZIME SKIN PROTECTANT EX  Apply 1 application topically daily.     DECUBI-VITE Caps  Take 1 capsule by mouth daily.     digoxin 0.125 MG tablet  Commonly known as:  LANOXIN  Take 0.0625 mg by mouth daily.     diltiazem 180 MG 24 hr capsule  Commonly known as:  CARDIZEM CD  Take 1 capsule (180 mg total) by mouth daily.     ELIQUIS 5 MG Tabs tablet  Generic drug:  apixaban  Take 5 mg by mouth daily.     feeding supplement (GLUCERNA 1.2 CAL) Liqd  Take 1,000 mLs by mouth daily.     ferrous fumarate 325 (106 FE) MG Tabs tablet  Commonly known as:  HEMOCYTE - 106 mg FE  Take 1 tablet (106 mg of iron total) by mouth daily at 12 noon.     furosemide 40 MG tablet  Commonly known as:  LASIX  Take 20 mg by mouth every other day.     gabapentin 100 MG capsule  Commonly known as:  NEURONTIN  Take 1 capsule (100 mg total) by mouth every 12 (twelve) hours.     glimepiride 2 MG tablet  Commonly known as:  AMARYL     insulin aspart 100 UNIT/ML FlexPen  Commonly known as:  NOVOLOG FLEXPEN  Inject 8 Units into the skin 3 (three) times daily with meals. Hold if cbg<100     Insulin Glargine 100 UNIT/ML Solostar Pen  Commonly known as:  LANTUS  Inject 24 Units into the skin at bedtime.     INSULIN SYRINGE .3CC/31GX5/16" 31G X 5/16" 0.3 ML Misc  0.3 mL 31 x 5/16"     linagliptin 5 MG  Tabs tablet  Commonly known as:  TRADJENTA  Take 5 mg by mouth daily.     lisinopril 10 MG tablet  Commonly known as:  PRINIVIL,ZESTRIL  Take 10 mg by mouth daily.     LORazepam 0.5 MG tablet  Commonly known as:  ATIVAN  Take 0.5 tablets (0.25 mg total) by mouth 2 (two) times daily as needed for anxiety.     Melatonin 5 MG Tabs  Take 5 mg by mouth at bedtime as needed (insomnia).     methocarbamol 500 MG tablet  Commonly known as:  ROBAXIN  Take 1 tablet (500 mg total) by mouth 3 (three) times daily.     omeprazole 20 MG capsule  Commonly known as:  PRILOSEC  Take 20 mg by mouth 2 (two) times daily before a meal.     oxyCODONE-acetaminophen 10-325 MG per tablet  Commonly known as:  PERCOCET  Take one tablet by mouth every 8 hours for pain     polyethylene glycol packet  Commonly known as:  MIRALAX / GLYCOLAX  Take 17 g by mouth daily as needed.     SENNA-S 8.6-50 MG per tablet  Generic drug:  senna-docusate  Take 1 tablet by mouth daily.     silver sulfADIAZINE 1 % cream  Commonly known as:  SILVADENE  Apply 1 application topically 2 (two) times daily.     SKIN PREP WIPES Misc  Apply 1 application topically daily.        Physical Exam Filed Vitals:   09/23/14 1236  BP: 145/69  Pulse: 76  Temp: 96.4 F (35.8 C)  Resp: 16   Constitutional: WDWN elderly male in no acute distress.  HEENT: Normocephalic and atraumatic. PERRL. EOM intact. No icterus Neck:  No JVD or carotid bruits. Cardiac: Normal S1, S2. RRR without appreciable murmurs, rubs, or gallops. Lungs: No respiratory distress. Breath sounds clear bilaterally without rales, rhonchi, or wheezes. Abdomen: Audible bowel sounds in all quadrants. Soft, nontender, nondistended.  Musculoskeletal: Normal range of motion. Right BKA Skin: Warm and dry. Erythematous confluent lesions with some scattering noted across on bilaterally wrists.  Neurological: Alert and oriented to person, place, and time.   Psychiatric: Judgment and insight adequate. Appropriate mood and affect.   Assessment & Plan 1. Contact dermatitis Hydrocortisone 0.5% cream topically TID to both wrists until healing complete. Continue to monitor.    Family/Staff Communication Plan of care discuss with resident and professional staff members. Resident and professional staff members verbalize understanding and agree with plan of care. No additional questions or concerns reported.    Arthur Holms, MSN, AGNP-C [facility] [address] [place], [place] [address] [phone] [8am-5pm] After hours: [phone]

## 2014-09-23 NOTE — Telephone Encounter (Signed)
[facility] 

## 2014-10-21 ENCOUNTER — Other Ambulatory Visit: Payer: Self-pay | Admitting: *Deleted

## 2014-10-21 MED ORDER — OXYCODONE-ACETAMINOPHEN 10-325 MG PO TABS: ORAL_TABLET | ORAL | Status: DC

## 2014-10-21 NOTE — Telephone Encounter (Signed)
[facility] 

## 2014-10-23 DIAGNOSIS — Z89519 Acquired absence of unspecified leg below knee: Secondary | ICD-10-CM

## 2014-10-23 DIAGNOSIS — T148 Other injury of unspecified body region: Secondary | ICD-10-CM

## 2014-10-23 DIAGNOSIS — R262 Difficulty in walking, not elsewhere classified: Secondary | ICD-10-CM

## 2014-10-31 ENCOUNTER — Telehealth: Payer: Self-pay

## 2014-10-31 DIAGNOSIS — I4891 Unspecified atrial fibrillation: Secondary | ICD-10-CM

## 2014-10-31 NOTE — Telephone Encounter (Signed)
Patient called at 4:17pm about the Digoxin strength, the one he just got d/c from [facility]. He has never taken 1/2 pill and very concern about this. I told him what we have in the computer that Dr Mariea Clonts had noted in his note. I would call [name] and ask them to fax his medication list. Asked him about his cardiologist, he was just there a few days ago, but he doesn't take care of the digoxin dose, Dr. Mariea Clonts does. Has a appt to get Est in office 11/06/14. Received a fax from [facility] questioning the dose.   [facility] faxed copies of the prescriptions Dr. Mariea Clonts signed digoxin 0.0625. Called Mr. Chastang back, patient was there per wife, at the apartment partly, told to call back and leave message on recorder. Left message that we can't change dose at this time, he could call his cardiologist office, I will talk with Dr. Mariea Clonts Monday to see if she has any other suggestions and I will call him back.

## 2014-11-03 ENCOUNTER — Other Ambulatory Visit: Payer: Self-pay

## 2014-11-03 ENCOUNTER — Telehealth: Payer: Self-pay | Admitting: Internal Medicine

## 2014-11-03 ENCOUNTER — Other Ambulatory Visit: Payer: Medicare Other

## 2014-11-03 DIAGNOSIS — I4891 Unspecified atrial fibrillation: Secondary | ICD-10-CM

## 2014-11-03 NOTE — Telephone Encounter (Signed)
Spoke with Dr. Mariea Clonts, she said patient is to take 0.0625mg  Digoxin. Called patient left message for patient to call back. Patient called told him Dr. Cyndi Lennert message. He said this is wrong, he has never taken 1/2 pill. Told him he needs to come in for a blood test, so we can see what his level is.  Patient is taking 1/2 of .125mg  as directed when discharged from nursing home. He will have to check with his wife, she is working right now. I'll put him down for digoxin level this afternoon.

## 2014-11-03 NOTE — Telephone Encounter (Signed)
New message      Talk to a nurse to get the correct dosage on his digoxin. Please call this am

## 2014-11-03 NOTE — Telephone Encounter (Signed)
He states he has been taking 1/2 of 0.125 mg tablet of Digoxin since Dec 1.  Apparently he was scheduled at Dr.Reed's office for repeat Digoxin level today but he wants to come here. He has taken his digoxin this AM so advised for him to come tomorrow and not to take diogoxin until lab has been done.  Advised once we have received the results will let him know what should be his correct dose. He understands and will wait for results.

## 2014-11-03 NOTE — Telephone Encounter (Signed)
Pt calling requesting dose of digoxin that he is suppose to be taking.  States he was d/c from [facility] on Dec 1 and when went to fill Rx was given Digoxin 0.125 mg

## 2014-11-04 ENCOUNTER — Other Ambulatory Visit (INDEPENDENT_AMBULATORY_CARE_PROVIDER_SITE_OTHER): Payer: Medicare Other | Admitting: *Deleted

## 2014-11-04 ENCOUNTER — Telehealth: Payer: Self-pay

## 2014-11-04 DIAGNOSIS — I4891 Unspecified atrial fibrillation: Secondary | ICD-10-CM

## 2014-11-04 NOTE — Telephone Encounter (Signed)
Left message, patient didn't come in for digoxin level yesterday, following up. To call back let us know what he plans on doing.

## 2014-11-05 ENCOUNTER — Telehealth: Payer: Self-pay | Admitting: Internal Medicine

## 2014-11-05 LAB — DIGOXIN LEVEL: Digoxin Level: 0.8 ng/mL (ref 0.8–2.0)

## 2014-11-05 NOTE — Telephone Encounter (Signed)
New Msg    Pt calling to get results of test from yesterday and is waiting for instructions about how much digoxin he should take.    Please return call [phone].

## 2014-11-06 ENCOUNTER — Ambulatory Visit: Payer: Medicare Other | Admitting: Nurse Practitioner

## 2014-11-06 NOTE — Telephone Encounter (Signed)
Follow up      Pt want to talk to the nurse regarding test results

## 2014-11-06 NOTE — Telephone Encounter (Signed)
Pt rtn call to Sherri, pls call

## 2014-11-06 NOTE — Telephone Encounter (Signed)
Informed patient of preliminary results and that Dr. Caryl Comes still needed to review. Explained that we would call him if Dr. Caryl Comes wants to make any changes to his Digoxin dosage. Patient verbalized understanding.

## 2014-11-10 ENCOUNTER — Encounter: Payer: Self-pay | Admitting: Nurse Practitioner

## 2014-11-10 ENCOUNTER — Ambulatory Visit (INDEPENDENT_AMBULATORY_CARE_PROVIDER_SITE_OTHER): Payer: Medicare Other | Admitting: Nurse Practitioner

## 2014-11-10 VITALS — BP 120/50 | HR 74 | Temp 98.4°F | Resp 18

## 2014-11-10 DIAGNOSIS — G8929 Other chronic pain: Secondary | ICD-10-CM

## 2014-11-10 DIAGNOSIS — E1159 Type 2 diabetes mellitus with other circulatory complications: Secondary | ICD-10-CM

## 2014-11-10 DIAGNOSIS — S81812A Laceration without foreign body, left lower leg, initial encounter: Secondary | ICD-10-CM

## 2014-11-10 DIAGNOSIS — D649 Anemia, unspecified: Secondary | ICD-10-CM

## 2014-11-10 DIAGNOSIS — K5900 Constipation, unspecified: Secondary | ICD-10-CM

## 2014-11-10 DIAGNOSIS — I482 Chronic atrial fibrillation, unspecified: Secondary | ICD-10-CM

## 2014-11-10 DIAGNOSIS — G546 Phantom limb syndrome with pain: Secondary | ICD-10-CM

## 2014-11-10 DIAGNOSIS — I5032 Chronic diastolic (congestive) heart failure: Secondary | ICD-10-CM

## 2014-11-10 DIAGNOSIS — M549 Dorsalgia, unspecified: Secondary | ICD-10-CM

## 2014-11-10 DIAGNOSIS — K219 Gastro-esophageal reflux disease without esophagitis: Secondary | ICD-10-CM

## 2014-11-10 MED ORDER — GLIMEPIRIDE 2 MG PO TABS: 2.0000 mg | ORAL_TABLET | Freq: Every day | ORAL | Status: DC

## 2014-11-10 MED ORDER — APIXABAN 5 MG PO TABS: 5.0000 mg | ORAL_TABLET | Freq: Every day | ORAL | Status: DC

## 2014-11-10 MED ORDER — FERROUS FUMARATE 325 (106 FE) MG PO TABS: 1.0000 | ORAL_TABLET | Freq: Every day | ORAL | Status: DC

## 2014-11-10 MED ORDER — OXYCODONE HCL 10 MG PO TABS: 10.0000 mg | ORAL_TABLET | Freq: Three times a day (TID) | ORAL | Status: DC | PRN

## 2014-11-10 MED ORDER — DIGOXIN 125 MCG PO TABS: 0.0625 mg | ORAL_TABLET | Freq: Every day | ORAL | Status: DC

## 2014-11-10 MED ORDER — GABAPENTIN 100 MG PO CAPS: 100.0000 mg | ORAL_CAPSULE | Freq: Two times a day (BID) | ORAL | Status: DC

## 2014-11-10 MED ORDER — FUROSEMIDE 20 MG PO TABS: 20.0000 mg | ORAL_TABLET | ORAL | Status: DC

## 2014-11-10 MED ORDER — DILTIAZEM HCL ER COATED BEADS 180 MG PO CP24: 180.0000 mg | ORAL_CAPSULE | Freq: Every day | ORAL | Status: DC

## 2014-11-10 NOTE — Progress Notes (Signed)
Patient ID: [name], male   DOB: 05-31-1935, 79 y.o.   MRN: [id number]    PCP: Georgetta Haber, MD  Allergies  Allergen Reactions  . Morphine Rash and Other (See Comments)    Irritability also  . Sulfate     Chief Complaint  Patient presents with  . Establish Care     HPI: Patient is a 79 y.o. male seen in the office today to establish care. Pt was previously seen [facility] for STR after right BKA was discharged home 10/21/14. Previously seen by Dr Arnoldo Morale who is no longer seeing patients.  -Taking percocet currently as needed. Previously was taking PLAIN oxycodone, which is a lot cheaper.  -Would like to come off tradenta, blood sugars at home have well controlled. Fasting blood sugars always ranging 110-115.  Eating better than when he was in the nursing facility.  -taking iron since April when he had a bleed from [place]. Hoping he can come off iron -saw dr Caryl Comes last week- did dig level which was 0.8 , reviewed with pt. Pt reports he was not taking a half tablet of dig until after he left the nursing home.  Currently being followed by home health while at home.  Review of Systems:  Review of Systems  Constitutional: Negative for activity change, appetite change, fatigue and unexpected weight change.  HENT: Negative for congestion and hearing loss.   Eyes: Negative.   Respiratory: Negative for cough and shortness of breath.   Cardiovascular: Negative for chest pain, palpitations and leg swelling.  Gastrointestinal: Negative for abdominal pain, diarrhea and constipation.  Genitourinary: Negative for dysuria and difficulty urinating.  Musculoskeletal: Positive for arthralgias.  Skin: Negative for color change and wound.  Neurological: Negative for dizziness and weakness.       Phantom pain in right foot  Psychiatric/Behavioral: Negative for behavioral problems, confusion and agitation.    Past Medical History  Diagnosis Date  . Fatigue   .  Overweight(278.02)   . Syncope and collapse   . GERD (gastroesophageal reflux disease)   . Right bundle branch block   . Unspecified venous (peripheral) insufficiency   . Phlebitis and thrombophlebitis of superficial vessels of lower extremities   . Cellulitis and abscess of leg, except foot   . Backache, unspecified   . Long term (current) use of anticoagulants   . Type II or unspecified type diabetes mellitus without mention of complication, not stated as uncontrolled   . Unspecified venous (peripheral) insufficiency   . Hypertension   . Gout, unspecified   . Heart failure   . Atrial fibrillation     permanent  . Esophageal stricture   . Hx of colonic polyps 05-2006    (Adenomatous)Dr. [name]  . Diverticulosis 05-2006    Dr. [name]   . Tachycardia-bradycardia     s/p PPM  . Pacemaker- [name] 02/20/2013  . CHF (congestive heart failure)   . Skin cancer     squamous cell carcinoma scalp  . Hx of radiation therapy 06/16/14- 07/18/14    mult squamous cell carcinomas of scalp   Past Surgical History  Procedure Laterality Date  . Laminectomy    . Lumbar fusion    . Rotator cuff repair    . Ruptured rt rectus muscle    . Eye surgery    . Pacemaker insertion  8/12    SJM by [name] for tachy/brady syndrome  . I&d extremity Right 01/17/2014    Procedure: IRRIGATION AND  DEBRIDEMENT RIGHT HEEL  WITH CULTURES AND BONE BIOPSY, placement of wound vac;  Surgeon: Theodoro Kos, DO;  Location: [place];  Service: Plastics;  Laterality: Right;  . Amputation Right 01/20/2014    Procedure: AMPUTATION BELOW KNEE- right;  Surgeon: [name], MD;  Location: [place];  Service: Orthopedics;  Laterality: Right;  Right Below Knee Amputation  . Amputation Right 02/14/2014    Procedure: AMPUTATION BELOW KNEE;  Surgeon: [name], MD;  Location: [place];  Service: Orthopedics;  Laterality: Right;  Right Below Knee Amputation Revision  . Amputation Right 03/07/2014    Procedure: AMPUTATION BELOW KNEE;   Surgeon: [name], MD;  Location: [place];  Service: Orthopedics;  Laterality: Right;  Right Below Knee Amputation Revision  . Amputation Right 03/18/2014    Procedure: revision of  AMPUTATION BELOW KNEE;  Surgeon: [name], MD;  Location: [place];  Service: Orthopedics;  Laterality: Right;  Revision Right Below Knee Amputation  . Above knee leg amputation Right 04/02/2014    DR DUDA  . Amputation Right 04/02/2014    Procedure: AMPUTATION ABOVE KNEE;  Surgeon: [name], MD;  Location: [place];  Service: Orthopedics;  Laterality: Right;  Right Above Knee Amputation  . Skin biopsy      scalp   Social History:   reports that he has quit smoking. He has never used smokeless tobacco. He reports that he does not drink alcohol or use illicit drugs.  Family History  Problem Relation Age of Onset  . Pneumonia Mother   . Heart disease Father   . Diabetes Father   . Colon cancer Neg Hx     Medications: Patient's Medications  New Prescriptions   No medications on file  Previous Medications   ACCU-CHEK SMARTVIEW TEST STRIP       APIXABAN (ELIQUIS) 5 MG TABS TABLET    Take 5 mg by mouth daily.   ASSURE COMFORT LANCETS 30G MISC       BLOOD GLUCOSE CALIBRATION (ACCU-CHEK SMARTVIEW CONTROL) LIQD       DIGOXIN (LANOXIN) 0.125 MG TABLET    Take 0.0625 mg by mouth daily.    DILTIAZEM (CARDIZEM CD) 180 MG 24 HR CAPSULE    Take 1 capsule (180 mg total) by mouth daily.   FERROUS FUMARATE (HEMOCYTE - 106 MG FE) 325 (106 FE) MG TABS TABLET    Take 1 tablet (106 mg of iron total) by mouth daily at 12 noon.   FUROSEMIDE (LASIX) 40 MG TABLET    Take 20 mg by mouth every other day.    GABAPENTIN (NEURONTIN) 100 MG CAPSULE    Take 1 capsule (100 mg total) by mouth every 12 (twelve) hours.   GLIMEPIRIDE (AMARYL) 2 MG TABLET       HUMALOG KWIKPEN 100 UNIT/ML KIWKPEN    Inject 8 Units into the skin 3 (three) times daily with meals.   INSULIN GLARGINE (LANTUS) 100 UNIT/ML SOLOSTAR PEN    Inject 24 Units  into the skin at bedtime.   INSULIN SYRINGE-NEEDLE U-100 (INSULIN SYRINGE .3CC/31GX5/16") 31G X 5/16" 0.3 ML MISC    0.3 mL 31 x 5/16"   LINAGLIPTIN (TRADJENTA) 5 MG TABS TABLET    Take 5 mg by mouth daily.   LISINOPRIL (PRINIVIL,ZESTRIL) 10 MG TABLET    Take 10 mg by mouth daily.   LORAZEPAM (ATIVAN) 0.5 MG TABLET    Take 0.5 tablets (0.25 mg total) by mouth 2 (two) times daily as needed  for anxiety.   MELATONIN 5 MG TABS    Take 5 mg by mouth at bedtime as needed (insomnia).   NUTRITIONAL SUPPLEMENTS (FEEDING SUPPLEMENT, GLUCERNA 1.2 CAL,) LIQD    Take 1,000 mLs by mouth daily.    OMEPRAZOLE (PRILOSEC) 20 MG CAPSULE    Take 20 mg by mouth 2 (two) times daily before a meal.   OXYCODONE-ACETAMINOPHEN (PERCOCET) 10-325 MG PER TABLET    Take one tablet by mouth every 8 hours for pain   POLYETHYLENE GLYCOL (MIRALAX / GLYCOLAX) PACKET    Take 17 g by mouth daily as needed.   SENNA-DOCUSATE (SENNA-S) 8.6-50 MG PER TABLET    Take 1 tablet by mouth daily.   SILVER SULFADIAZINE (SILVADENE) 1 % CREAM    Apply 1 application topically 2 (two) times daily.  Modified Medications   No medications on file  Discontinued Medications   INSULIN ASPART (NOVOLOG FLEXPEN) 100 UNIT/ML FLEXPEN    Inject 8 Units into the skin 3 (three) times daily with meals. Hold if cbg<100   METHOCARBAMOL (ROBAXIN) 500 MG TABLET    Take 1 tablet (500 mg total) by mouth 3 (three) times daily.   MULTIPLE VITAMINS-MINERALS (DECUBI-VITE) CAPS    Take 1 capsule by mouth daily.    OSTOMY SUPPLIES (SKIN PREP WIPES) MISC    Apply 1 application topically daily.   SKIN PROTECTANTS, MISC. (CALAZIME SKIN PROTECTANT EX)    Apply 1 application topically daily.     Physical Exam:  Filed Vitals:   11/10/14 1332  BP: 120/50  Pulse: 74  Temp: 98.4 F (36.9 C)  TempSrc: Oral  Resp: 18  SpO2: 98%    Physical Exam  Constitutional: He is oriented to person, place, and time. He appears well-developed and well-nourished. No distress.  HENT:    Head: Normocephalic and atraumatic.  Mouth/Throat: Oropharynx is clear and moist. No oropharyngeal exudate.  Eyes: Conjunctivae are normal. Pupils are equal, round, and reactive to light.  Neck: Normal range of motion. Neck supple.  Cardiovascular: Normal rate, regular rhythm and normal heart sounds.   Pulmonary/Chest: Effort normal and breath sounds normal.  Abdominal: Soft. Bowel sounds are normal.  Musculoskeletal: He exhibits no edema.  Right AKA  Neurological: He is alert and oriented to person, place, and time.  Skin:  Redness and scaling of his scalp Skin tear 1 cm noted to left shin  Psychiatric: He has a normal mood and affect.    Labs reviewed: Basic Metabolic Panel:  Recent Labs  03/20/14 0645 04/02/14 0835 08/28/14 1228  NA 137 138 135  K 4.1 4.9 4.4  CL 98 100 102  CO2 26 26 26   GLUCOSE 95 150* 154*  BUN 25* 34* 43*  CREATININE 1.50* 1.44* 1.7*  CALCIUM 8.7 9.2 9.1   Liver Function Tests:  Recent Labs  03/02/14 0710 03/15/14 2019 03/16/14 0640  AST 12 13 12   ALT 7 10 9   ALKPHOS 95 122* 98  BILITOT 0.9 0.4 0.4  PROT 6.4 6.1 5.5*  ALBUMIN 2.6* 2.3* 2.2*   No results for input(s): LIPASE, AMYLASE in the last 8760 hours. No results for input(s): AMMONIA in the last 8760 hours. CBC:  Recent Labs  04/03/14 0550 04/04/14 0430 04/05/14 0615 05/12/14 06/06/14 07/25/14 0833 08/04/14 0600  WBC 9.2 7.8 7.4 7.4  --   --   --   NEUTROABS 6.4 5.1 4.5  --   --   --   --   HGB 9.2* 8.2* 8.3*  --  9.9* 10.8* 10.5*  HCT 28.3* 24.6* 25.4*  --  29* 31.9* 31.2*  MCV 85.2 87.5 86.1  --   --   --   --   PLT 232 184 183 198  --   --   --    Lipid Panel: No results for input(s): CHOL, HDL, LDLCALC, TRIG, CHOLHDL, LDLDIRECT in the last 8760 hours. TSH: No results for input(s): TSH in the last 8760 hours. A1C: Lab Results  Component Value Date   HGBA1C 6.6* 05/12/2014     Assessment/Plan  1. Chronic atrial fibrillation -stable at this time -discussed  lab work, dig level 0.8, will cont current dose of dig and follow up in 1 month (pt reports he was on higher dose at NH but called and verified the 0.0625.  - Digoxin level; Future - apixaban (ELIQUIS) 5 MG TABS tablet; Take 1 tablet (5 mg total) by mouth daily.  Dispense: 60 tablet; Refill: 2 - diltiazem (CARDIZEM CD) 180 MG 24 hr capsule; Take 1 capsule (180 mg total) by mouth daily.  Dispense: 30 capsule; Refill: 6 - digoxin (LANOXIN) 0.125 MG tablet; Take 0.5 tablets (0.0625 mg total) by mouth daily.  Dispense: 30 tablet; Refill: 2  2. Chronic diastolic CHF (congestive heart failure) -following with cardiology - furosemide (LASIX) 20 MG tablet; Take 1 tablet (20 mg total) by mouth every other day.  Dispense: 30 tablet; Refill: 3  3. Type 2 diabetes mellitus with other circulatory complications -blood sugars in good range, will STOP tradjenta at this time and cont to follow   - Lipid panel; Future - Comprehensive metabolic panel; Future - Hemoglobin A1c; Future - glimepiride (AMARYL) 2 MG tablet; Take 1 tablet (2 mg total) by mouth daily with breakfast.  Dispense: 30 tablet; Refill: 3  4. Gastroesophageal reflux disease without esophagitis Stable on omeprazole BID   5. Constipation, unspecified constipation type Stable on senna s only   6. Anemia, unspecified anemia type - CBC With differential/Platelet; Future - Iron and TIBC; Future - Ferritin; Future - to cont ferrous fumarate (HEMOCYTE - 106 MG FE) 325 (106 FE) MG TABS tablet; for now 7. Chronic back pain -percocet is a lot more expensive than plain oxycodone, would like this changed, okay with this.  -pain contract signed - Oxycodone HCl 10 MG TABS; Take 1 tablet (10 mg total) by mouth every 8 (eight) hours as needed. For pain  Dispense: 90 tablet; Refill: 0  8. Phantom pain Stable on currentmedication - gabapentin (NEURONTIN) 100 MG capsule; Take 1 capsule (100 mg total) by mouth every 12 (twelve) hours.  Dispense: 90  capsule; Refill: 3  9. Skin Tear Slow healing skin tear, has HH PT/OT already coming to house, will get nursing involved  To evaluate and treat due to slow healing wound and pt already with AKA  Follow up in 4-6 weeks EV with fasting blood work prior to visit To get MMSE at visit

## 2014-11-10 NOTE — Patient Instructions (Signed)
To follow up in 4-6 weeks with FASTING blood work prior to visit  STOP Tradjenta  Changed percocet to plain oxycodone.  Cont Iron for now Cont current dose of digoxin, will follow up dig level with next blood work

## 2014-11-13 ENCOUNTER — Telehealth: Payer: Self-pay

## 2014-11-13 NOTE — Telephone Encounter (Signed)
Timmothy Sours called back and indicated patient missed appointment today due to no grab bar in bathroom. FYI ONLY

## 2014-11-13 NOTE — Telephone Encounter (Signed)
?   If ok to add 1 OT visit in 3 weeks  OK to move visit depending on when grab bars installed in bathroom  Please advise if above is ok

## 2014-11-13 NOTE — Telephone Encounter (Signed)
Per Janett Billow ok to give verbal orders.  Called Don and gave verbal.

## 2014-11-17 ENCOUNTER — Other Ambulatory Visit: Payer: Self-pay | Admitting: Internal Medicine

## 2014-11-18 ENCOUNTER — Telehealth: Payer: Self-pay | Admitting: *Deleted

## 2014-11-18 NOTE — Telephone Encounter (Signed)
Patient had would on leg and [facility] was calling to treat with Wound care. Gave verbal orders and she will fax assessment and care plan.

## 2014-11-21 DIAGNOSIS — Z89519 Acquired absence of unspecified leg below knee: Secondary | ICD-10-CM | POA: Diagnosis not present

## 2014-11-24 ENCOUNTER — Ambulatory Visit (HOSPITAL_COMMUNITY)
Admission: RE | Admit: 2014-11-24 | Discharge: 2014-11-24 | Disposition: A | Discharge status: Home / Self Care | Payer: Medicare Other | Source: Ambulatory Visit | Attending: Infectious Diseases | Admitting: Infectious Diseases

## 2014-11-24 ENCOUNTER — Encounter: Payer: Self-pay | Admitting: Infectious Diseases

## 2014-11-24 ENCOUNTER — Ambulatory Visit (INDEPENDENT_AMBULATORY_CARE_PROVIDER_SITE_OTHER): Payer: Medicare Other | Admitting: Infectious Diseases

## 2014-11-24 ENCOUNTER — Other Ambulatory Visit: Payer: Self-pay | Admitting: Infectious Diseases

## 2014-11-24 ENCOUNTER — Encounter (HOSPITAL_COMMUNITY): Payer: Self-pay

## 2014-11-24 VITALS — BP 174/74 | HR 87 | Temp 98.1°F | Ht 70.0 in | Wt 170.0 lb

## 2014-11-24 DIAGNOSIS — E11621 Type 2 diabetes mellitus with foot ulcer: Secondary | ICD-10-CM | POA: Insufficient documentation

## 2014-11-24 DIAGNOSIS — L97529 Non-pressure chronic ulcer of other part of left foot with unspecified severity: Secondary | ICD-10-CM | POA: Insufficient documentation

## 2014-11-24 DIAGNOSIS — L97509 Non-pressure chronic ulcer of other part of unspecified foot with unspecified severity: Secondary | ICD-10-CM

## 2014-11-24 LAB — BASIC METABOLIC PANEL
Anion gap: 8 (ref 5–15)
BUN: 24 mg/dL — ABNORMAL HIGH (ref 6–23)
BUN: 24 mg/dL — ABNORMAL HIGH (ref 6–23)
CHLORIDE: 103 meq/L (ref 96–112)
CO2: 25 mmol/L (ref 19–32)
CO2: 25 mEq/L (ref 19–32)
Calcium: 9.3 mg/dL (ref 8.4–10.5)
Calcium: 9.3 mg/dL (ref 8.4–10.5)
Chloride: 103 mEq/L (ref 96–112)
Creat: 1.38 mg/dL — ABNORMAL HIGH (ref 0.50–1.35)
Creatinine, Ser: 1.38 mg/dL — ABNORMAL HIGH (ref 0.50–1.35)
GFR calc Af Amer: 55 mL/min — ABNORMAL LOW (ref >90)
GFR calc non Af Amer: 48 mL/min — ABNORMAL LOW (ref >90)
Glucose, Bld: 119 mg/dL — ABNORMAL HIGH (ref 70–99)
Glucose, Bld: 119 mg/dL — ABNORMAL HIGH (ref 70–99)
Potassium: 4.9 mmol/L (ref 3.5–5.1)
Potassium: 4.9 mEq/L (ref 3.5–5.3)
Sodium: 136 mmol/L (ref 135–145)
Sodium: 136 mEq/L (ref 135–145)

## 2014-11-24 LAB — C-REACTIVE PROTEIN

## 2014-11-24 LAB — CBC
HEMATOCRIT: 36.5 % — AB (ref 39.0–52.0)
Hemoglobin: 12.8 g/dL — ABNORMAL LOW (ref 13.0–17.0)
MCH: 28.8 pg (ref 26.0–34.0)
MCHC: 35.1 g/dL (ref 30.0–36.0)
MCV: 82 fL (ref 78.0–100.0)
MPV: 10.3 fL (ref 8.6–12.4)
PLATELETS: 221 10*3/uL (ref 150–400)
RBC: 4.45 MIL/uL (ref 4.22–5.81)
RDW: 14.3 % (ref 11.5–15.5)
WBC: 9.8 10*3/uL (ref 4.0–10.5)

## 2014-11-24 MED ORDER — IOHEXOL 300 MG/ML  SOLN
80.0000 mL | Freq: Once | INTRAMUSCULAR | Status: AC | PRN
  Administered 2014-11-24: 80 mL via INTRAVENOUS

## 2014-11-24 NOTE — Addendum Note (Signed)
Addended by: Landis Gandy on: 11/24/2014 02:44 PM   Modules accepted: Orders

## 2014-11-24 NOTE — Progress Notes (Signed)
   Subjective:    Patient ID: [name], male    DOB: November 08, 1935, 80 y.o.   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  He had a worsening wound on his heel for approximately half a year. The wound care clinic prescribed him doxycycline and then ciprofloxacin after recovering Pseudomonas (pan-sens) from a deep tissue culture.  Patient had undergone a CT scan of the heel which showed: Osteolysis and active osteomyelitis of the calcaneus.  He ended up being admitted to [facility] for worsening infection, foul odor erythema from around the wound. Dr. Migdalia Dk took him to the OR and did a bone biopsy for culture which yielded abundant GBS.   The following day however he decided he had better proceed with curative BKA which Dr. Sharol Given performed. He was seen by ID and abx were stopped within 72 hours post surgery.  He was dc to [facility]. Unfortunately he developed a soft tissue infection involving his stump with purulence at the site and Dr Sharol Given started him on doxy. He was seen by ID and cultured the area of purulence. Added keflex and he grew a Klebsiella PNA that was S to this abx.  Since then the wound has worsening dehiscence and purulence. He saw Dr. Satira Mccallum took him to the OR for I and D of stump infection on 02/14/14.  He has continued on doxy and keflex but then bled from the stump and was admitted to the hospital sp transfusion and re-exam by Orthopedics. He then underwent yet another revision of stump on 03/18/14. Unfortunately he had progressive gangrenous changes of this site and required admission for AKA by Dr. Sharol Given on 04/02/14. He has continued on keflex and doxy until roughly mid-July.   He called ID on call on 11-23-14 with concerns about return of a  spot on his leg similar to the lesion which caused him to lose his leg.  He has a diabetic foot ulcer on his lateral L heel. There has been minimal d/c. There has been mild pain. There has been on erythema.  He has had no f/c. States his FSG have been well controlled.   Review of Systems     Objective:   Physical Exam  Constitutional: He appears well-developed and well-nourished.  Musculoskeletal:       Legs:         Assessment & Plan:

## 2014-11-24 NOTE — Assessment & Plan Note (Signed)
Will send him to ortho for bone Bx, will get CT scan with contrast (he cannot have MRI) to eval for osteo. Will get Cr prior to CT. Will hold on anbx til we get bx.  He is quite concerned that he will lose this foot as well.  Will see him back as soon as he can get Bx.

## 2014-11-25 ENCOUNTER — Telehealth: Payer: Self-pay | Admitting: *Deleted

## 2014-11-25 DIAGNOSIS — I70262 Atherosclerosis of native arteries of extremities with gangrene, left leg: Secondary | ICD-10-CM | POA: Diagnosis not present

## 2014-11-25 DIAGNOSIS — L97421 Non-pressure chronic ulcer of left heel and midfoot limited to breakdown of skin: Secondary | ICD-10-CM | POA: Diagnosis not present

## 2014-11-25 DIAGNOSIS — E1142 Type 2 diabetes mellitus with diabetic polyneuropathy: Secondary | ICD-10-CM | POA: Diagnosis not present

## 2014-11-25 DIAGNOSIS — Z89611 Acquired absence of right leg above knee: Secondary | ICD-10-CM | POA: Diagnosis not present

## 2014-11-25 LAB — SEDIMENTATION RATE: SED RATE: 34 mm/h — AB (ref 0–16)

## 2014-11-25 NOTE — Telephone Encounter (Signed)
Received fax notification of approval for CT left foot -- [id number]. CT performed 11/24/14.

## 2014-11-26 ENCOUNTER — Telehealth: Payer: Self-pay | Admitting: Infectious Diseases

## 2014-11-26 NOTE — Telephone Encounter (Signed)
Spoke with pt- his CT did not show ostoe or abscess. He was seen by ortho and felt to only need local wound care. He is getting home health care.  Will see him back as scheduled.

## 2014-11-27 DIAGNOSIS — R262 Difficulty in walking, not elsewhere classified: Secondary | ICD-10-CM | POA: Diagnosis not present

## 2014-11-27 DIAGNOSIS — Z89519 Acquired absence of unspecified leg below knee: Secondary | ICD-10-CM | POA: Diagnosis not present

## 2014-12-01 ENCOUNTER — Other Ambulatory Visit: Payer: Self-pay | Admitting: Internal Medicine

## 2014-12-03 DIAGNOSIS — R262 Difficulty in walking, not elsewhere classified: Secondary | ICD-10-CM | POA: Diagnosis not present

## 2014-12-03 DIAGNOSIS — Z89519 Acquired absence of unspecified leg below knee: Secondary | ICD-10-CM | POA: Diagnosis not present

## 2014-12-09 ENCOUNTER — Telehealth: Payer: Self-pay | Admitting: *Deleted

## 2014-12-09 NOTE — Telephone Encounter (Signed)
Manuela Schwartz with HomeHealth called and left message to return her call regarding patient. I tried calling her and left her a message to return call.

## 2014-12-09 NOTE — Telephone Encounter (Signed)
Pt seen 11/24/14, to return in 6 days per Dr. Johnnye Sima.  Pt did not make return appt when he let clinic.  Requesting appt with either Dr. Johnnye Sima or Dr. Tommy Medal.  Dr. Tommy Medal has opening tomorrow at 1000.  Wife took this appt.

## 2014-12-09 NOTE — Telephone Encounter (Signed)
Spoke with Manuela Schwartz with [facility]. She stated that patient has a pressure ulcer on his outer aspect of heel. Patient went to see Dr. Sharol Given. Nurse is applying Silvasorb gel to it and needs verbal wound orders. Given.

## 2014-12-10 ENCOUNTER — Ambulatory Visit: Payer: Medicare Other | Admitting: Infectious Disease

## 2014-12-10 DIAGNOSIS — R262 Difficulty in walking, not elsewhere classified: Secondary | ICD-10-CM | POA: Diagnosis not present

## 2014-12-10 DIAGNOSIS — Z89519 Acquired absence of unspecified leg below knee: Secondary | ICD-10-CM | POA: Diagnosis not present

## 2014-12-15 DIAGNOSIS — R262 Difficulty in walking, not elsewhere classified: Secondary | ICD-10-CM | POA: Diagnosis not present

## 2014-12-15 DIAGNOSIS — Z89519 Acquired absence of unspecified leg below knee: Secondary | ICD-10-CM | POA: Diagnosis not present

## 2014-12-16 DIAGNOSIS — L97421 Non-pressure chronic ulcer of left heel and midfoot limited to breakdown of skin: Secondary | ICD-10-CM | POA: Diagnosis not present

## 2014-12-16 DIAGNOSIS — Z89611 Acquired absence of right leg above knee: Secondary | ICD-10-CM | POA: Diagnosis not present

## 2014-12-16 DIAGNOSIS — E1142 Type 2 diabetes mellitus with diabetic polyneuropathy: Secondary | ICD-10-CM | POA: Diagnosis not present

## 2014-12-16 DIAGNOSIS — I70262 Atherosclerosis of native arteries of extremities with gangrene, left leg: Secondary | ICD-10-CM | POA: Diagnosis not present

## 2014-12-17 DIAGNOSIS — H10503 Unspecified blepharoconjunctivitis, bilateral: Secondary | ICD-10-CM | POA: Diagnosis not present

## 2014-12-18 DIAGNOSIS — C4442 Squamous cell carcinoma of skin of scalp and neck: Secondary | ICD-10-CM | POA: Diagnosis not present

## 2014-12-18 DIAGNOSIS — I4510 Unspecified right bundle-branch block: Secondary | ICD-10-CM | POA: Diagnosis not present

## 2014-12-18 DIAGNOSIS — Z89519 Acquired absence of unspecified leg below knee: Secondary | ICD-10-CM | POA: Diagnosis not present

## 2014-12-19 DIAGNOSIS — Z89519 Acquired absence of unspecified leg below knee: Secondary | ICD-10-CM | POA: Diagnosis not present

## 2014-12-19 DIAGNOSIS — R262 Difficulty in walking, not elsewhere classified: Secondary | ICD-10-CM | POA: Diagnosis not present

## 2014-12-22 ENCOUNTER — Ambulatory Visit: Payer: Medicare Other | Admitting: Infectious Diseases

## 2014-12-22 ENCOUNTER — Other Ambulatory Visit: Payer: Medicare Other

## 2014-12-22 DIAGNOSIS — R262 Difficulty in walking, not elsewhere classified: Secondary | ICD-10-CM | POA: Diagnosis not present

## 2014-12-22 DIAGNOSIS — Z89519 Acquired absence of unspecified leg below knee: Secondary | ICD-10-CM | POA: Diagnosis not present

## 2014-12-22 DIAGNOSIS — T148 Other injury of unspecified body region: Secondary | ICD-10-CM | POA: Diagnosis not present

## 2014-12-23 ENCOUNTER — Ambulatory Visit: Payer: Medicare Other | Admitting: Infectious Diseases

## 2014-12-23 DIAGNOSIS — Z89519 Acquired absence of unspecified leg below knee: Secondary | ICD-10-CM | POA: Diagnosis not present

## 2014-12-23 DIAGNOSIS — R262 Difficulty in walking, not elsewhere classified: Secondary | ICD-10-CM | POA: Diagnosis not present

## 2014-12-23 DIAGNOSIS — T148 Other injury of unspecified body region: Secondary | ICD-10-CM | POA: Diagnosis not present

## 2014-12-24 ENCOUNTER — Other Ambulatory Visit: Payer: Self-pay

## 2014-12-24 ENCOUNTER — Ambulatory Visit: Payer: Medicare Other | Admitting: Infectious Diseases

## 2014-12-24 ENCOUNTER — Other Ambulatory Visit: Payer: Self-pay | Admitting: *Deleted

## 2014-12-24 DIAGNOSIS — G8929 Other chronic pain: Secondary | ICD-10-CM

## 2014-12-24 DIAGNOSIS — M549 Dorsalgia, unspecified: Principal | ICD-10-CM

## 2014-12-24 MED ORDER — OXYCODONE HCL 10 MG PO TABS: 10.0000 mg | ORAL_TABLET | Freq: Three times a day (TID) | ORAL | Status: DC | PRN

## 2014-12-24 NOTE — Telephone Encounter (Signed)
Patient requested and will pick up 

## 2014-12-25 ENCOUNTER — Other Ambulatory Visit: Payer: Medicare Other

## 2014-12-26 ENCOUNTER — Ambulatory Visit: Payer: Self-pay | Admitting: Podiatrist

## 2014-12-26 DIAGNOSIS — R262 Difficulty in walking, not elsewhere classified: Secondary | ICD-10-CM | POA: Diagnosis not present

## 2014-12-26 DIAGNOSIS — T148 Other injury of unspecified body region: Secondary | ICD-10-CM | POA: Diagnosis not present

## 2014-12-26 DIAGNOSIS — Z89519 Acquired absence of unspecified leg below knee: Secondary | ICD-10-CM | POA: Diagnosis not present

## 2014-12-29 ENCOUNTER — Ambulatory Visit (INDEPENDENT_AMBULATORY_CARE_PROVIDER_SITE_OTHER): Payer: Medicare Other | Admitting: Nurse Practitioner

## 2014-12-29 ENCOUNTER — Encounter: Payer: Self-pay | Admitting: Nurse Practitioner

## 2014-12-29 DIAGNOSIS — I482 Chronic atrial fibrillation, unspecified: Secondary | ICD-10-CM

## 2014-12-29 DIAGNOSIS — E1159 Type 2 diabetes mellitus with other circulatory complications: Secondary | ICD-10-CM | POA: Diagnosis not present

## 2014-12-29 DIAGNOSIS — D649 Anemia, unspecified: Secondary | ICD-10-CM

## 2014-12-29 DIAGNOSIS — E1151 Type 2 diabetes mellitus with diabetic peripheral angiopathy without gangrene: Secondary | ICD-10-CM | POA: Diagnosis not present

## 2014-12-29 DIAGNOSIS — I4891 Unspecified atrial fibrillation: Secondary | ICD-10-CM | POA: Diagnosis not present

## 2014-12-29 NOTE — Patient Instructions (Signed)
Will get blood work today, follow up in 6 months for routine visit with Dr Mariea Clonts

## 2014-12-29 NOTE — Progress Notes (Signed)
Patient ID: [name], male   DOB: 11-11-1935, 80 y.o.   MRN: [id number]    PCP: Lauree Chandler, NP  Allergies  Allergen Reactions  . Morphine Rash and Other (See Comments)    Irritability also    Chief Complaint  Patient presents with  . Annual Exam    Comprehensive exam: A-fib, blood sugar, anemia, GERD, CHF, chronic back pain  . Skin Ulcer    left foot, had CT 11/24/14, seen Dr. Johnnye Sima     HPI: Patient is a 80 y.o. male seen in the office today for wellness visit.  Hx of diabetes, needs Hgb A1c, foot exam Eye exam- Dr Satira Sark, last exam was 10/21/2014 Dental exam- last exam 09/21/2014 Colonoscopy- done 06/01/2006 Cardiovascular Exercise- none, due to foot problem Diet- diabetic diet, drinks a lot of coffee  Up to date on influenza and pneumococcal exam    Pt conts to follow up with ID regarding foot ulcer, never followed up due to snow needs an appt, cardiology regarding a fib and CHF, seeing Dr Sharol Given due to foot ulcer. Noticed a spot on the left side of his left heel. Had CT and then was taken to [facility] office, using silvadeen and covering area now bayetta is following. conts to follow up with Dr Sharol Given, using speciality sock and boot.  Also seeing dermatology due to hx of skin cancer  Review of Systems:  Review of Systems  Constitutional: Negative for activity change, appetite change, fatigue and unexpected weight change.  HENT: Negative for congestion and hearing loss.   Eyes: Negative.   Respiratory: Negative for cough and shortness of breath.   Cardiovascular: Negative for chest pain, palpitations and leg swelling.  Gastrointestinal: Negative for abdominal pain, diarrhea and constipation.  Genitourinary: Negative for dysuria and difficulty urinating.  Musculoskeletal: Positive for back pain and arthralgias.       Pain has been good, attempting to use less of pain medication   Skin: Positive for wound (small ulcer to left lateral heel, followin giwth Sharol Given).  Negative for color change.  Neurological: Negative for dizziness and weakness.       Phantom pain in right foot  Psychiatric/Behavioral: Negative for behavioral problems, confusion and agitation.    Past Medical History  Diagnosis Date  . Fatigue   . Overweight(278.02)   . Syncope and collapse   . GERD (gastroesophageal reflux disease)   . Right bundle branch block   . Unspecified venous (peripheral) insufficiency   . Phlebitis and thrombophlebitis of superficial vessels of lower extremities   . Cellulitis and abscess of leg, except foot   . Backache, unspecified   . Long term (current) use of anticoagulants   . Type II or unspecified type diabetes mellitus without mention of complication, not stated as uncontrolled   . Unspecified venous (peripheral) insufficiency   . Hypertension   . Gout, unspecified   . Heart failure   . Atrial fibrillation     permanent  . Esophageal stricture   . Hx of colonic polyps 05-2006    (Adenomatous)Dr. [name]  . Diverticulosis 05-2006    Dr. [name]   . Tachycardia-bradycardia     s/p PPM  . Pacemaker- [name] 02/20/2013  . CHF (congestive heart failure)   . Skin cancer     squamous cell carcinoma scalp  . Hx of radiation therapy 06/16/14- 07/18/14    mult squamous cell carcinomas of scalp   Past Surgical History  Procedure Laterality Date  . Laminectomy    .  Lumbar fusion    . Rotator cuff repair    . Ruptured rt rectus muscle    . Pacemaker insertion  8/12    SJM by [name] for tachy/brady syndrome  . I&d extremity Right 01/17/2014    Procedure: IRRIGATION AND DEBRIDEMENT RIGHT HEEL  WITH CULTURES AND BONE BIOPSY, placement of wound vac;  Surgeon: Theodoro Kos, DO;  Location: [place];  Service: Plastics;  Laterality: Right;  . Amputation Right 01/20/2014    Procedure: AMPUTATION BELOW KNEE- right;  Surgeon: [name], MD;  Location: [place];  Service: Orthopedics;  Laterality: Right;  Right Below Knee Amputation  . Amputation Right  02/14/2014    Procedure: AMPUTATION BELOW KNEE;  Surgeon: [name], MD;  Location: [place];  Service: Orthopedics;  Laterality: Right;  Right Below Knee Amputation Revision  . Amputation Right 03/07/2014    Procedure: AMPUTATION BELOW KNEE;  Surgeon: [name], MD;  Location: [place];  Service: Orthopedics;  Laterality: Right;  Right Below Knee Amputation Revision  . Amputation Right 03/18/2014    Procedure: revision of  AMPUTATION BELOW KNEE;  Surgeon: [name], MD;  Location: [place];  Service: Orthopedics;  Laterality: Right;  Revision Right Below Knee Amputation  . Above knee leg amputation Right 04/02/2014    DR DUDA  . Amputation Right 04/02/2014    Procedure: AMPUTATION ABOVE KNEE;  Surgeon: [name], MD;  Location: [place];  Service: Orthopedics;  Laterality: Right;  Right Above Knee Amputation  . Skin biopsy      scalp  . Eye surgery     Social History:   reports that he has quit smoking. He has never used smokeless tobacco. He reports that he does not drink alcohol or use illicit drugs.  Family History  Problem Relation Age of Onset  . Pneumonia Mother   . Heart disease Father   . Diabetes Father   . Colon cancer Neg Hx     Medications: Patient's Medications  New Prescriptions   No medications on file  Previous Medications   ACCU-CHEK SMARTVIEW TEST STRIP       APIXABAN (ELIQUIS) 5 MG TABS TABLET    Take 1 tablet (5 mg total) by mouth daily.   ASSURE COMFORT LANCETS 30G MISC       B-D ULTRAFINE III SHORT PEN 31G X 8 MM MISC    USE AS DIRECTED   BLOOD GLUCOSE CALIBRATION (ACCU-CHEK SMARTVIEW CONTROL) LIQD       DIGOXIN (LANOXIN) 0.125 MG TABLET    Take 0.5 tablets (0.0625 mg total) by mouth daily.   DILTIAZEM (CARDIZEM CD) 180 MG 24 HR CAPSULE    Take 1 capsule (180 mg total) by mouth daily.   FUROSEMIDE (LASIX) 20 MG TABLET    Take 1 tablet (20 mg total) by mouth every other day.   GABAPENTIN (NEURONTIN) 100 MG CAPSULE    Take 1 capsule (100 mg total) by mouth  every 12 (twelve) hours.   GLIMEPIRIDE (AMARYL) 2 MG TABLET    Take 1 tablet (2 mg total) by mouth daily with breakfast.   HUMALOG KWIKPEN 100 UNIT/ML KIWKPEN    INJECT 8 UNITS BEFORE MEALS-HOLD FOR BLOOD SUGAR LESS THAN 100   INSULIN SYRINGE-NEEDLE U-100 (INSULIN SYRINGE .3CC/31GX5/16") 31G X 5/16" 0.3 ML MISC    0.3 mL 31 x 5/16"   LANTUS SOLOSTAR 100 UNIT/ML SOLOSTAR PEN    INJECT 24 UNITS AT BEDTIME   LISINOPRIL (PRINIVIL,ZESTRIL) 10 MG  TABLET    TAKE 1 TABLET EACH DAY.   MELATONIN 5 MG TABS    Take 5 mg by mouth at bedtime as needed (insomnia).   NUTRITIONAL SUPPLEMENTS (FEEDING SUPPLEMENT, GLUCERNA 1.2 CAL,) LIQD    Take 237 mLs by mouth daily as needed.    OMEPRAZOLE (PRILOSEC) 20 MG CAPSULE    TAKE (1) CAPSULE TWICE DAILY.   OXYCODONE HCL 10 MG TABS    Take 1 tablet (10 mg total) by mouth every 8 (eight) hours as needed. For pain   SENNA-DOCUSATE (SENNA-S) 8.6-50 MG PER TABLET    Take 1 tablet by mouth daily as needed.   Modified Medications   No medications on file  Discontinued Medications   FERROUS FUMARATE (HEMOCYTE - 106 MG FE) 325 (106 FE) MG TABS TABLET    Take 1 tablet (106 mg of iron total) by mouth daily.     Physical Exam:  Filed Vitals:   12/29/14 1358  BP: 100/62  Pulse: 72  Temp: 97.6 F (36.4 C)  TempSrc: Oral  SpO2: 99%    Physical Exam  Constitutional: He is oriented to person, place, and time. He appears well-developed and well-nourished. No distress.  HENT:  Head: Normocephalic and atraumatic.  Right Ear: External ear normal.  Left Ear: External ear normal.  Nose: Nose normal.  Mouth/Throat: Oropharynx is clear and moist. No oropharyngeal exudate.  Eyes: Conjunctivae and EOM are normal. Pupils are equal, round, and reactive to light.  Neck: Normal range of motion. Neck supple. No JVD present. No thyromegaly present.  Cardiovascular: Normal rate, regular rhythm and normal heart sounds.   Pulses:      Dorsalis pedis pulses are 2+ on the left side.    Pulmonary/Chest: Effort normal and breath sounds normal. No respiratory distress.  Abdominal: Soft. Bowel sounds are normal. He exhibits no distension. There is no tenderness.  Musculoskeletal: He exhibits tenderness (to lumbar spine, some increased pain to lower back with ROM of left leg). He exhibits no edema.  Right AKA  Lymphadenopathy:    He has no cervical adenopathy.  Neurological: He is alert and oriented to person, place, and time.  Skin: Skin is warm and dry. No rash noted. No erythema.  Small eschar 0.5 x 1 cm to left lateral heel  Psychiatric: He has a normal mood and affect.    Labs reviewed: Basic Metabolic Panel:  Recent Labs  04/02/14 0835 08/28/14 1228 11/24/14 1451  NA 138 135 136  136  K 4.9 4.4 4.9  4.9  CL 100 102 103  103  CO2 26 26 25  25   GLUCOSE 150* 154* 119*  119*  BUN 34* 43* 24*  24*  CREATININE 1.44* 1.7* 1.38*  1.38*  CALCIUM 9.2 9.1 9.3  9.3   Liver Function Tests:  Recent Labs  03/02/14 0710 03/15/14 2019 03/16/14 0640  AST 12 13 12   ALT 7 10 9   ALKPHOS 95 122* 98  BILITOT 0.9 0.4 0.4  PROT 6.4 6.1 5.5*  ALBUMIN 2.6* 2.3* 2.2*   No results for input(s): LIPASE, AMYLASE in the last 8760 hours. No results for input(s): AMMONIA in the last 8760 hours. CBC:  Recent Labs  04/03/14 0550 04/04/14 0430 04/05/14 0615 05/12/14  07/25/14 0833 08/04/14 0600 11/24/14 1451  WBC 9.2 7.8 7.4 7.4  --   --   --  9.8  NEUTROABS 6.4 5.1 4.5  --   --   --   --   --  HGB 9.2* 8.2* 8.3*  --   < > 10.8* 10.5* 12.8*  HCT 28.3* 24.6* 25.4*  --   < > 31.9* 31.2* 36.5*  MCV 85.2 87.5 86.1  --   --   --   --  82.0  PLT 232 184 183 198  --   --   --  221  < > = values in this interval not displayed. Lipid Panel: No results for input(s): CHOL, HDL, LDLCALC, TRIG, CHOLHDL, LDLDIRECT in the last 8760 hours. TSH: No results for input(s): TSH in the last 8760 hours. A1C: Lab Results  Component Value Date   HGBA1C 6.6* 05/12/2014      Assessment/Plan  1. Anemia, unspecified anemia type -off all supplements will follow up labs - CBC With differential/Platelet - Iron and TIBC - Ferritin  2. Type 2 diabetes mellitus with other circulatory complications Cont on lantus,humalog and ACE - Lipid panel - Comprehensive metabolic panel - Hemoglobin A1c  3. Chronic atrial fibrillation -rate controlled, conts on eliquis, dig and cardizem  - folllow up Digoxin level   4. Preventative Care PREVENTIVE COUNSELING:  The patient was counseled regarding the appropriate use of alcohol, prevention of dental and periodontal disease, diet, and recommended schedule for GI hemoccult testing, colonoscopy, cholesterol, thyroid and diabetes screening.  Pt does not wish to have prostate screening.   Follow up with Dr Mariea Clonts in 6 months, sooner if needed

## 2014-12-29 NOTE — Progress Notes (Signed)
Passed clock drawing 

## 2014-12-30 LAB — CBC WITH DIFFERENTIAL
BASOS ABS: 0.1 10*3/uL (ref 0.0–0.2)
Basos: 1 %
EOS ABS: 0.2 10*3/uL (ref 0.0–0.4)
Eos: 2 %
HEMATOCRIT: 37.9 % (ref 37.5–51.0)
HEMOGLOBIN: 13.2 g/dL (ref 12.6–17.7)
IMMATURE GRANS (ABS): 0 10*3/uL (ref 0.0–0.1)
Immature Granulocytes: 0 %
LYMPHS: 24 %
Lymphocytes Absolute: 2.5 10*3/uL (ref 0.7–3.1)
MCH: 28.7 pg (ref 26.6–33.0)
MCHC: 34.8 g/dL (ref 31.5–35.7)
MCV: 82 fL (ref 79–97)
Monocytes Absolute: 0.6 10*3/uL (ref 0.1–0.9)
Monocytes: 5 %
NEUTROS PCT: 68 %
Neutrophils Absolute: 7.3 10*3/uL — ABNORMAL HIGH (ref 1.4–7.0)
RBC: 4.6 x10E6/uL (ref 4.14–5.80)
RDW: 14 % (ref 12.3–15.4)
WBC: 10.7 10*3/uL (ref 3.4–10.8)

## 2014-12-30 LAB — LIPID PANEL
CHOL/HDL RATIO: 4.7 ratio (ref 0.0–5.0)
Cholesterol, Total: 149 mg/dL (ref 100–199)
HDL: 32 mg/dL — ABNORMAL LOW (ref >39)
LDL CALC: 88 mg/dL (ref 0–99)
Triglycerides: 146 mg/dL (ref 0–149)
VLDL CHOLESTEROL CAL: 29 mg/dL (ref 5–40)

## 2014-12-30 LAB — COMPREHENSIVE METABOLIC PANEL
ALK PHOS: 119 IU/L — AB (ref 39–117)
ALT: 16 IU/L (ref 0–44)
AST: 14 IU/L (ref 0–40)
Albumin/Globulin Ratio: 1.4 (ref 1.1–2.5)
Albumin: 3.9 g/dL (ref 3.5–4.8)
BUN / CREAT RATIO: 20 (ref 10–22)
BUN: 31 mg/dL — ABNORMAL HIGH (ref 8–27)
Bilirubin Total: 0.4 mg/dL (ref 0.0–1.2)
CHLORIDE: 100 mmol/L (ref 97–108)
CO2: 21 mmol/L (ref 18–29)
Calcium: 9.4 mg/dL (ref 8.6–10.2)
Creatinine, Ser: 1.58 mg/dL — ABNORMAL HIGH (ref 0.76–1.27)
GFR calc Af Amer: 48 mL/min/{1.73_m2} — ABNORMAL LOW (ref >59)
GFR calc non Af Amer: 42 mL/min/{1.73_m2} — ABNORMAL LOW (ref >59)
Globulin, Total: 2.8 g/dL (ref 1.5–4.5)
Glucose: 156 mg/dL — ABNORMAL HIGH (ref 65–99)
Potassium: 4.8 mmol/L (ref 3.5–5.2)
SODIUM: 137 mmol/L (ref 134–144)
Total Protein: 6.7 g/dL (ref 6.0–8.5)

## 2014-12-30 LAB — IRON AND TIBC
IRON SATURATION: 21 % (ref 15–55)
IRON: 63 ug/dL (ref 38–169)
Total Iron Binding Capacity: 301 ug/dL (ref 250–450)
UIBC: 238 ug/dL (ref 111–343)

## 2014-12-30 LAB — HEMOGLOBIN A1C
Est. average glucose Bld gHb Est-mCnc: 134 mg/dL
Hgb A1c MFr Bld: 6.3 % — ABNORMAL HIGH (ref 4.8–5.6)

## 2014-12-30 LAB — FERRITIN: FERRITIN: 93 ng/mL (ref 30–400)

## 2014-12-30 LAB — DIGOXIN LEVEL: Digoxin Level: 0.6 ng/mL — ABNORMAL LOW (ref 0.9–2.0)

## 2014-12-31 DIAGNOSIS — Z89519 Acquired absence of unspecified leg below knee: Secondary | ICD-10-CM | POA: Diagnosis not present

## 2014-12-31 DIAGNOSIS — R262 Difficulty in walking, not elsewhere classified: Secondary | ICD-10-CM | POA: Diagnosis not present

## 2014-12-31 DIAGNOSIS — T148 Other injury of unspecified body region: Secondary | ICD-10-CM | POA: Diagnosis not present

## 2015-01-08 DIAGNOSIS — T148 Other injury of unspecified body region: Secondary | ICD-10-CM | POA: Diagnosis not present

## 2015-01-08 DIAGNOSIS — Z89519 Acquired absence of unspecified leg below knee: Secondary | ICD-10-CM | POA: Diagnosis not present

## 2015-01-08 DIAGNOSIS — R262 Difficulty in walking, not elsewhere classified: Secondary | ICD-10-CM | POA: Diagnosis not present

## 2015-01-11 IMAGING — CR DG WRIST COMPLETE 3+V*R*
4 series · 4 of 4 positions shown · non-contrast
Comparison: None.

CLINICAL DATA: Fell several days ago. Right wrist pain and
swelling.

EXAM:
RIGHT WRIST - COMPLETE 3+ VIEW

[x wrist pa right]
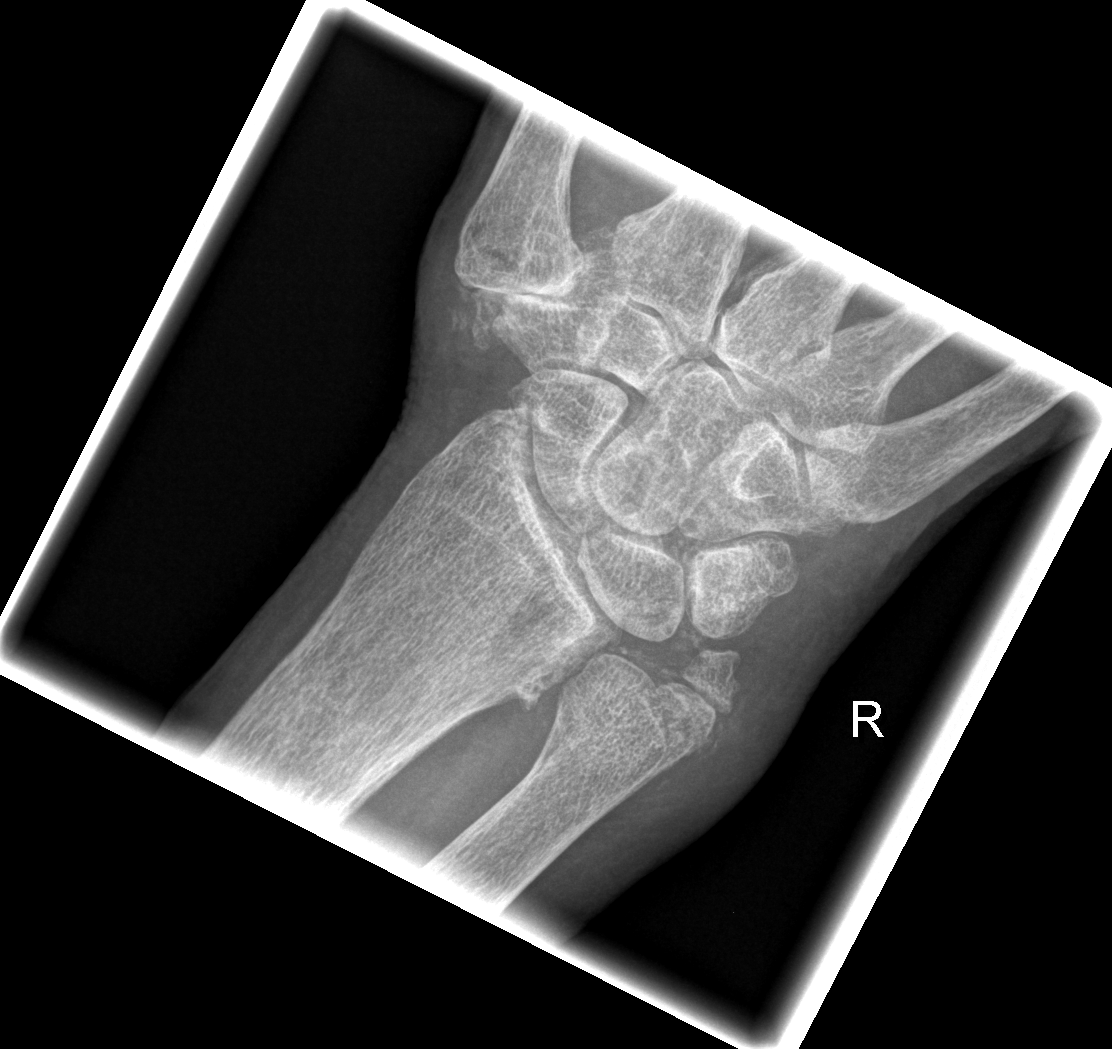

[x wrist obl right]
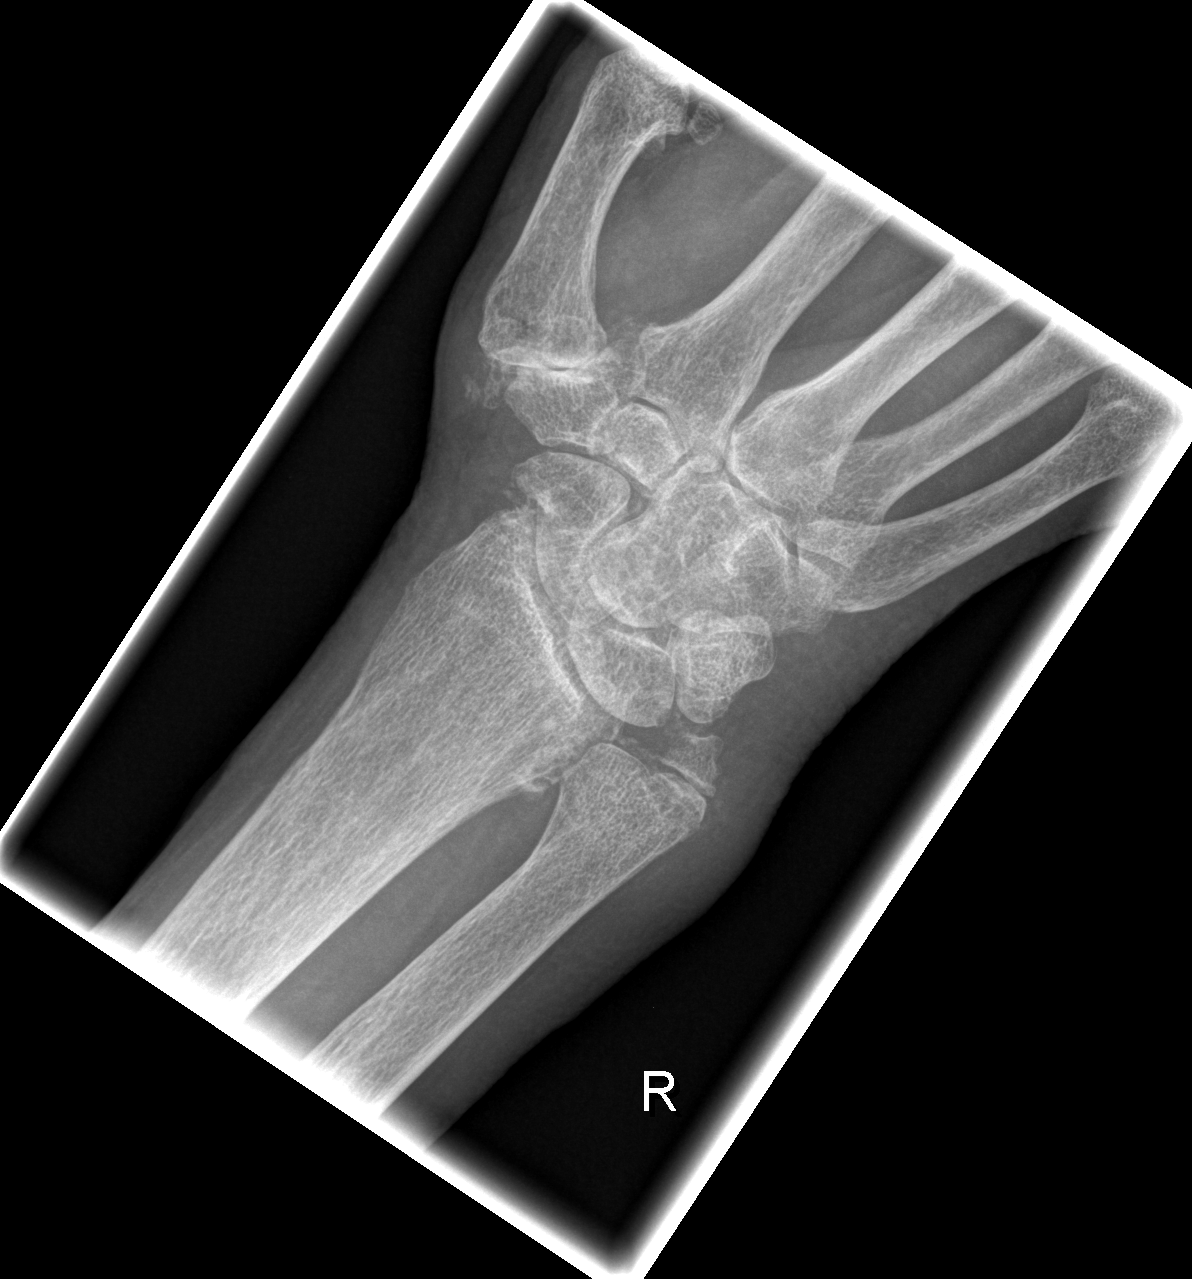

[x wrist lat right]
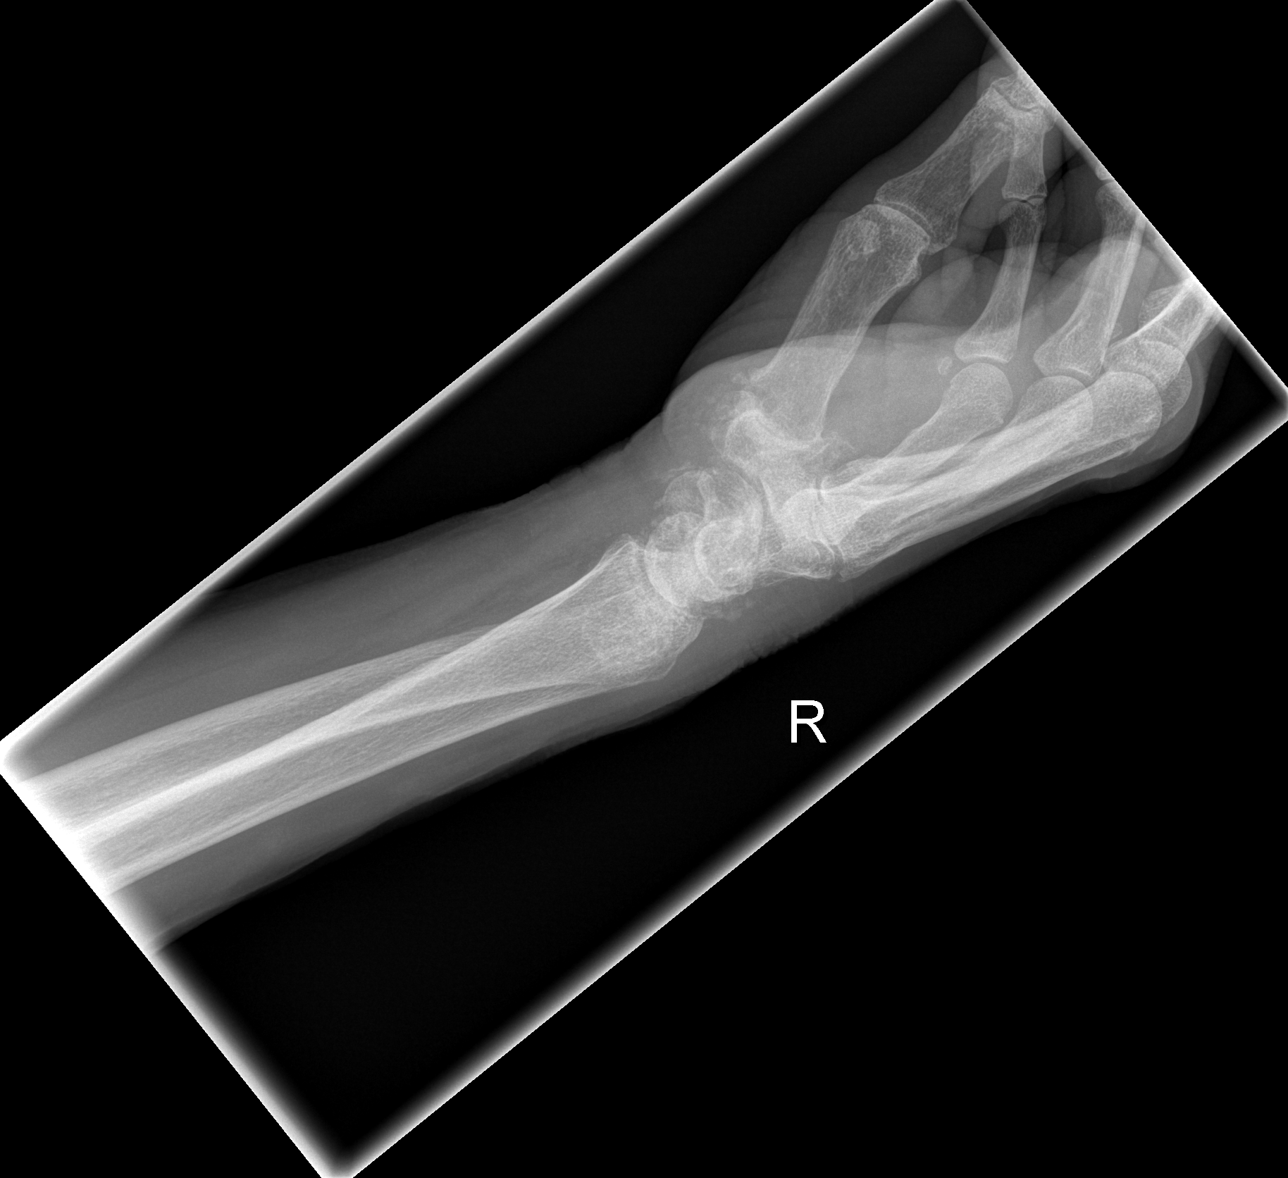

[x navicular]
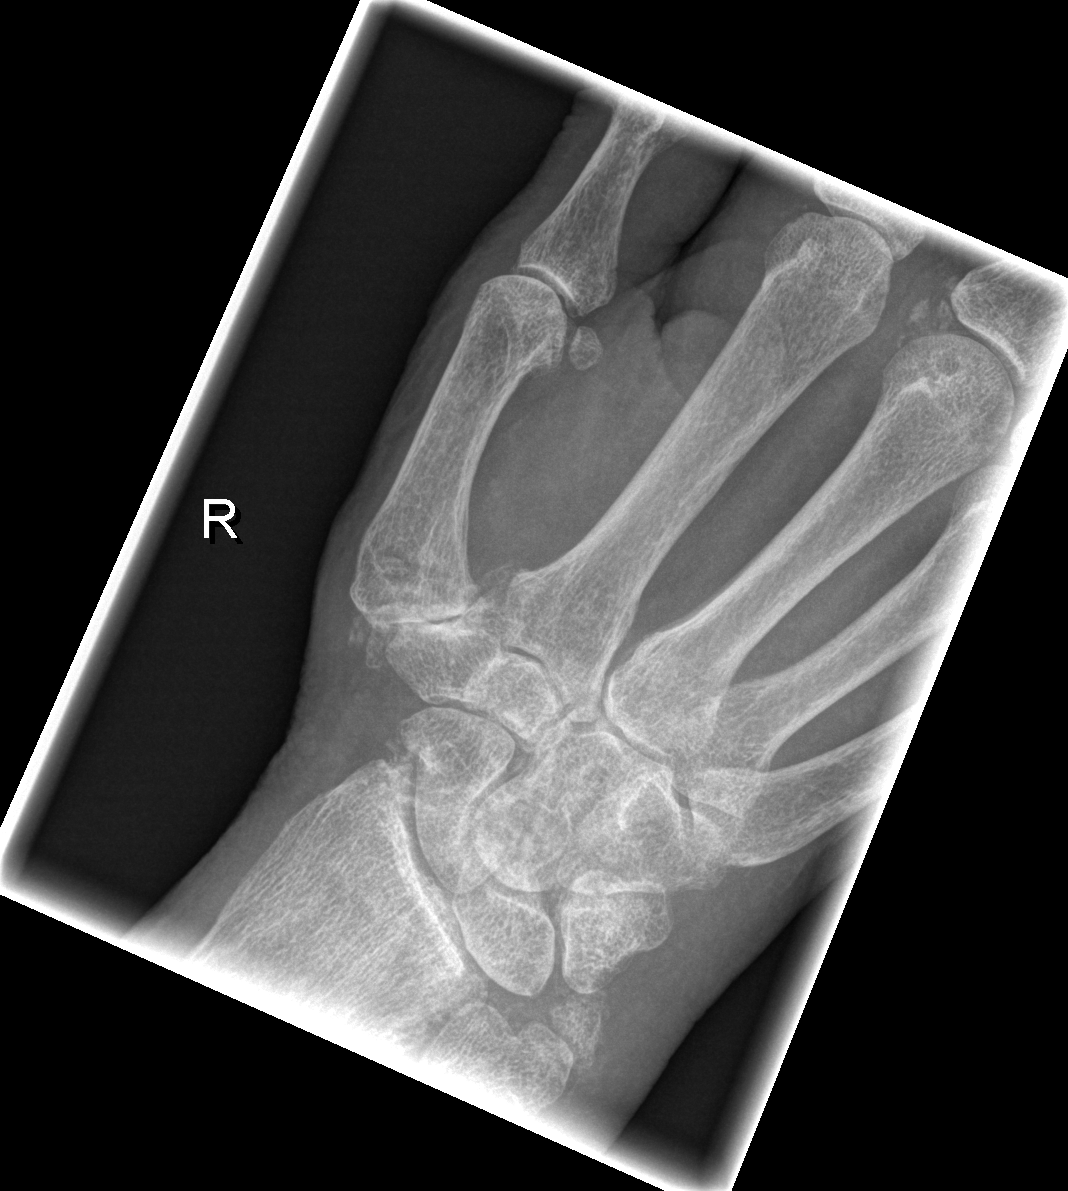

[4 of 4 positions shown; findings below may reference images not displayed]

FINDINGS: No acute fracture. There is an old ulnar styloid fracture. Soft
tissue calcifications are seen surrounding the wrist joints. This
suggest chondrocalcinosis. There is joint space narrowing and
subchondral sclerosis with adjacent soft tissue calcification at the
first carpal metacarpal articulation. The bones are diffusely
demineralized.

There is mild soft tissue swelling predominates along the ulnar
aspect of the wrist.
IMPRESSION: No acute fracture or dislocation.

## 2015-01-14 ENCOUNTER — Telehealth: Payer: Self-pay | Admitting: *Deleted

## 2015-01-14 ENCOUNTER — Encounter: Payer: Self-pay | Admitting: *Deleted

## 2015-01-14 ENCOUNTER — Other Ambulatory Visit: Payer: Self-pay | Admitting: *Deleted

## 2015-01-14 DIAGNOSIS — I482 Chronic atrial fibrillation, unspecified: Secondary | ICD-10-CM

## 2015-01-14 DIAGNOSIS — I4891 Unspecified atrial fibrillation: Secondary | ICD-10-CM

## 2015-01-14 MED ORDER — DIGOXIN 125 MCG PO TABS: 0.1250 mg | ORAL_TABLET | Freq: Every day | ORAL | Status: DC

## 2015-01-14 NOTE — Telephone Encounter (Signed)
Patient Notified and agreed with lab instructions. Digoxin called into pharmacy this morning by Ivin Booty and lab appointment scheduled for 3/10. Patient Aware and agree.

## 2015-01-14 NOTE — Telephone Encounter (Signed)
Patient called yesterday and left Voice message wanting someone to return his call regarding his medications and labs. I tried calling him back yesterday but no answer. Tried calling him this morning at 9:20 and his wife stated that he was still in the bed and will have him give [place] a call back when he got up.

## 2015-01-14 NOTE — Progress Notes (Signed)
Changed patient digoxin medication in list, and sent new script to pharmacy to be filled.

## 2015-01-15 ENCOUNTER — Ambulatory Visit: Payer: Self-pay | Admitting: Podiatrist

## 2015-01-15 ENCOUNTER — Other Ambulatory Visit: Payer: Self-pay | Admitting: Dermatology

## 2015-01-15 DIAGNOSIS — Z85828 Personal history of other malignant neoplasm of skin: Secondary | ICD-10-CM | POA: Diagnosis not present

## 2015-01-15 DIAGNOSIS — C44519 Basal cell carcinoma of skin of other part of trunk: Secondary | ICD-10-CM | POA: Diagnosis not present

## 2015-01-15 DIAGNOSIS — L570 Actinic keratosis: Secondary | ICD-10-CM | POA: Diagnosis not present

## 2015-01-15 DIAGNOSIS — C44619 Basal cell carcinoma of skin of left upper limb, including shoulder: Secondary | ICD-10-CM | POA: Diagnosis not present

## 2015-01-15 DIAGNOSIS — D225 Melanocytic nevi of trunk: Secondary | ICD-10-CM | POA: Diagnosis not present

## 2015-01-15 DIAGNOSIS — L821 Other seborrheic keratosis: Secondary | ICD-10-CM | POA: Diagnosis not present

## 2015-01-16 ENCOUNTER — Telehealth: Payer: Self-pay | Admitting: *Deleted

## 2015-01-16 NOTE — Telephone Encounter (Signed)
Received fax from [facility] regarding a Drug Interaction Alert between Omeprazole 20mg  and Digoxin 0.125mg . Given fax warning to Sherrie Mustache to respond.

## 2015-01-16 NOTE — Telephone Encounter (Signed)
Member ID: [id number] Incident ID: [id number] [facility] #: [phone]

## 2015-01-19 ENCOUNTER — Telehealth: Payer: Self-pay | Admitting: *Deleted

## 2015-01-19 ENCOUNTER — Other Ambulatory Visit: Payer: Self-pay | Admitting: Nurse Practitioner

## 2015-01-19 MED ORDER — OMEPRAZOLE 20 MG PO CPDR: 20.0000 mg | DELAYED_RELEASE_CAPSULE | Freq: Every day | ORAL | Status: DC

## 2015-01-19 NOTE — Telephone Encounter (Signed)
Don with [facility] called wanting verbal orders for OT. Verbal order given. Patient needs to be shown how to use the new shower bar.

## 2015-01-19 NOTE — Progress Notes (Signed)
Discussed [employer] drug-drug interaction with omeprazole with digoxin with pt. Pt willing to cut back omeprazole to once daily at this time. Discussed lifestyle modifications and stopping omeprazole completely if no symptoms from once daily dose.

## 2015-01-20 DIAGNOSIS — T148 Other injury of unspecified body region: Secondary | ICD-10-CM | POA: Diagnosis not present

## 2015-01-20 DIAGNOSIS — R262 Difficulty in walking, not elsewhere classified: Secondary | ICD-10-CM | POA: Diagnosis not present

## 2015-01-20 DIAGNOSIS — Z89519 Acquired absence of unspecified leg below knee: Secondary | ICD-10-CM | POA: Diagnosis not present

## 2015-01-22 ENCOUNTER — Ambulatory Visit: Payer: Self-pay | Admitting: Podiatrist

## 2015-01-22 DIAGNOSIS — I70262 Atherosclerosis of native arteries of extremities with gangrene, left leg: Secondary | ICD-10-CM | POA: Diagnosis not present

## 2015-01-22 DIAGNOSIS — E1142 Type 2 diabetes mellitus with diabetic polyneuropathy: Secondary | ICD-10-CM | POA: Diagnosis not present

## 2015-01-22 DIAGNOSIS — Z89611 Acquired absence of right leg above knee: Secondary | ICD-10-CM | POA: Diagnosis not present

## 2015-01-22 DIAGNOSIS — L97421 Non-pressure chronic ulcer of left heel and midfoot limited to breakdown of skin: Secondary | ICD-10-CM | POA: Diagnosis not present

## 2015-01-23 DIAGNOSIS — R262 Difficulty in walking, not elsewhere classified: Secondary | ICD-10-CM | POA: Diagnosis not present

## 2015-01-23 DIAGNOSIS — T148 Other injury of unspecified body region: Secondary | ICD-10-CM | POA: Diagnosis not present

## 2015-01-23 DIAGNOSIS — Z89519 Acquired absence of unspecified leg below knee: Secondary | ICD-10-CM | POA: Diagnosis not present

## 2015-01-27 DIAGNOSIS — Z89519 Acquired absence of unspecified leg below knee: Secondary | ICD-10-CM | POA: Diagnosis not present

## 2015-01-27 DIAGNOSIS — T148 Other injury of unspecified body region: Secondary | ICD-10-CM | POA: Diagnosis not present

## 2015-01-27 DIAGNOSIS — R262 Difficulty in walking, not elsewhere classified: Secondary | ICD-10-CM | POA: Diagnosis not present

## 2015-01-29 ENCOUNTER — Ambulatory Visit (INDEPENDENT_AMBULATORY_CARE_PROVIDER_SITE_OTHER): Payer: Medicare Other | Admitting: Nurse Practitioner

## 2015-01-29 ENCOUNTER — Encounter: Payer: Self-pay | Admitting: Nurse Practitioner

## 2015-01-29 ENCOUNTER — Other Ambulatory Visit: Payer: Self-pay

## 2015-01-29 VITALS — BP 130/60 | HR 64 | Temp 99.2°F | Resp 18 | Wt 159.0 lb

## 2015-01-29 DIAGNOSIS — D649 Anemia, unspecified: Secondary | ICD-10-CM | POA: Diagnosis not present

## 2015-01-29 DIAGNOSIS — G8929 Other chronic pain: Secondary | ICD-10-CM | POA: Diagnosis not present

## 2015-01-29 DIAGNOSIS — M549 Dorsalgia, unspecified: Secondary | ICD-10-CM | POA: Diagnosis not present

## 2015-01-29 DIAGNOSIS — N189 Chronic kidney disease, unspecified: Secondary | ICD-10-CM | POA: Diagnosis not present

## 2015-01-29 DIAGNOSIS — I4891 Unspecified atrial fibrillation: Secondary | ICD-10-CM | POA: Diagnosis not present

## 2015-01-29 DIAGNOSIS — K219 Gastro-esophageal reflux disease without esophagitis: Secondary | ICD-10-CM | POA: Diagnosis not present

## 2015-01-29 DIAGNOSIS — M159 Polyosteoarthritis, unspecified: Secondary | ICD-10-CM

## 2015-01-29 MED ORDER — OXYCODONE HCL 10 MG PO TABS: 10.0000 mg | ORAL_TABLET | Freq: Three times a day (TID) | ORAL | Status: DC | PRN

## 2015-01-29 NOTE — Patient Instructions (Signed)
Stop omeprazole at this time, notify if acid reflux symptoms return  Will check labs today- including digoxin level

## 2015-01-29 NOTE — Progress Notes (Signed)
Patient ID: [name], male   DOB: [birth year], 80 y.o.   MRN: [id number]    PCP: Lauree Chandler, NP  Allergies  Allergen Reactions  . Morphine Rash and Other (See Comments)    Irritability also    Chief Complaint  Patient presents with  . Medical Management of Chronic Issues     HPI: Patient is a 80 y.o. male seen in the office today  Pt had bad pain in knuckles and elbows 4 days ago, took pain pill (oxycodone) and used icyhot which relieved pain and hasnt had pain since. Wants information on arthritis.   Cutting back on pain medication but does still need it occasionally. Has chronic pain in back from surgery over 10 years ago  Pt was also called by myself 10 days ago due to interaction with omeprazole and digoxin- he cut back to omeprazole daily- not having any symptoms of GERD. Unsure why he was started on omeprazole    Increased digoxin to 1 tablet after last labs where dig level was low.   BUN/CR was elevated with last labs, increased hydration no NSAIDS.  Advanced Directive information Does patient have an advance directive?: Yes, Type of Advance Directive: [place];Living will Review of Systems:  Review of Systems  Constitutional: Negative for activity change, appetite change, fatigue and unexpected weight change.  Eyes: Negative.   Respiratory: Negative for cough and shortness of breath.   Cardiovascular: Negative for chest pain, palpitations and leg swelling.  Gastrointestinal: Negative for abdominal pain, diarrhea and constipation.       Denies GERD symptoms  Musculoskeletal: Positive for back pain and arthralgias.  Skin: Negative for color change.  Neurological: Negative for dizziness and weakness.  Psychiatric/Behavioral: Negative for confusion.    Past Medical History  Diagnosis Date  . Fatigue   . Overweight(278.02)   . Syncope and collapse   . GERD (gastroesophageal reflux disease)   . Right bundle branch block   .  Unspecified venous (peripheral) insufficiency   . Phlebitis and thrombophlebitis of superficial vessels of lower extremities   . Cellulitis and abscess of leg, except foot   . Backache, unspecified   . Long term (current) use of anticoagulants   . Type II or unspecified type diabetes mellitus without mention of complication, not stated as uncontrolled   . Unspecified venous (peripheral) insufficiency   . Hypertension   . Gout, unspecified   . Heart failure   . Atrial fibrillation     permanent  . Esophageal stricture   . Hx of colonic polyps 05-2006    (Adenomatous)Dr. [name]  . Diverticulosis 05-2006    Dr. [name]   . Tachycardia-bradycardia     s/p PPM  . Pacemaker- [name] 02/20/2013  . CHF (congestive heart failure)   . Skin cancer     squamous cell carcinoma scalp  . Hx of radiation therapy 06/16/14- 07/18/14    mult squamous cell carcinomas of scalp   Past Surgical History  Procedure Laterality Date  . Laminectomy    . Lumbar fusion    . Rotator cuff repair    . Ruptured rt rectus muscle    . Pacemaker insertion  8/12    SJM by [name] for tachy/brady syndrome  . I&d extremity Right 01/17/2014    Procedure: IRRIGATION AND DEBRIDEMENT RIGHT HEEL  WITH CULTURES AND BONE BIOPSY, placement of wound vac;  Surgeon: Theodoro Kos, DO;  Location: [place];  Service: Plastics;  Laterality: Right;  .  Amputation Right 01/20/2014    Procedure: AMPUTATION BELOW KNEE- right;  Surgeon: [name], MD;  Location: [place];  Service: Orthopedics;  Laterality: Right;  Right Below Knee Amputation  . Amputation Right 02/14/2014    Procedure: AMPUTATION BELOW KNEE;  Surgeon: [name], MD;  Location: [place];  Service: Orthopedics;  Laterality: Right;  Right Below Knee Amputation Revision  . Amputation Right 03/07/2014    Procedure: AMPUTATION BELOW KNEE;  Surgeon: [name], MD;  Location: [place];  Service: Orthopedics;  Laterality: Right;  Right Below Knee Amputation Revision  . Amputation  Right 03/18/2014    Procedure: revision of  AMPUTATION BELOW KNEE;  Surgeon: [name], MD;  Location: [place];  Service: Orthopedics;  Laterality: Right;  Revision Right Below Knee Amputation  . Above knee leg amputation Right 04/02/2014    DR DUDA  . Amputation Right 04/02/2014    Procedure: AMPUTATION ABOVE KNEE;  Surgeon: [name], MD;  Location: [place];  Service: Orthopedics;  Laterality: Right;  Right Above Knee Amputation  . Skin biopsy      scalp  . Eye surgery     Social History:   reports that he has quit smoking. He has never used smokeless tobacco. He reports that he does not drink alcohol or use illicit drugs.  Family History  Problem Relation Age of Onset  . Pneumonia Mother   . Heart disease Father   . Diabetes Father   . Colon cancer Neg Hx     Medications: Patient's Medications  New Prescriptions   No medications on file  Previous Medications   ACCU-CHEK SMARTVIEW TEST STRIP       APIXABAN (ELIQUIS) 5 MG TABS TABLET    Take 1 tablet (5 mg total) by mouth daily.   ASSURE COMFORT LANCETS 30G MISC       B-D ULTRAFINE III SHORT PEN 31G X 8 MM MISC    USE AS DIRECTED   BLOOD GLUCOSE CALIBRATION (ACCU-CHEK SMARTVIEW CONTROL) LIQD       DIGOXIN (LANOXIN) 0.125 MG TABLET    Take 1 tablet (0.125 mg total) by mouth daily.   DILTIAZEM (CARDIZEM CD) 180 MG 24 HR CAPSULE    Take 1 capsule (180 mg total) by mouth daily.   FUROSEMIDE (LASIX) 20 MG TABLET    Take 1 tablet (20 mg total) by mouth every other day.   GABAPENTIN (NEURONTIN) 100 MG CAPSULE    Take 1 capsule (100 mg total) by mouth every 12 (twelve) hours.   GLIMEPIRIDE (AMARYL) 2 MG TABLET    Take 1 tablet (2 mg total) by mouth daily with breakfast.   HUMALOG KWIKPEN 100 UNIT/ML KIWKPEN    INJECT 8 UNITS BEFORE MEALS-HOLD FOR BLOOD SUGAR LESS THAN 100   INSULIN SYRINGE-NEEDLE U-100 (INSULIN SYRINGE .3CC/31GX5/16") 31G X 5/16" 0.3 ML MISC    0.3 mL 31 x 5/16"   LANTUS SOLOSTAR 100 UNIT/ML SOLOSTAR PEN    INJECT 24  UNITS AT BEDTIME   LISINOPRIL (PRINIVIL,ZESTRIL) 10 MG TABLET    TAKE 1 TABLET EACH DAY.   MELATONIN 5 MG TABS    Take 5 mg by mouth at bedtime as needed (insomnia).   NUTRITIONAL SUPPLEMENTS (FEEDING SUPPLEMENT, GLUCERNA 1.2 CAL,) LIQD    Take 237 mLs by mouth daily as needed.    OMEPRAZOLE (PRILOSEC) 20 MG CAPSULE    Take 1 capsule (20 mg total) by mouth daily.   OXYCODONE HCL 10 MG TABS  Take 1 tablet (10 mg total) by mouth every 8 (eight) hours as needed. For pain   SENNA-DOCUSATE (SENNA-S) 8.6-50 MG PER TABLET    Take 1 tablet by mouth daily as needed.   Modified Medications   No medications on file  Discontinued Medications   No medications on file     Physical Exam:  Filed Vitals:   01/29/15 1547  BP: 130/60  Pulse: 64  Temp: 99.2 F (37.3 C)  TempSrc: Oral  Resp: 18  Weight: 159 lb (72.122 kg)  SpO2: 99%    Physical Exam  Constitutional: He is oriented to person, place, and time. He appears well-developed and well-nourished. No distress.  Neck: Normal range of motion. Neck supple.  Cardiovascular: Normal rate, regular rhythm and normal heart sounds.   Pulmonary/Chest: Effort normal and breath sounds normal. No respiratory distress.  Abdominal: Soft. Bowel sounds are normal.  Musculoskeletal: He exhibits tenderness (to lumbar spine). He exhibits no edema.  Right AKA  Neurological: He is alert and oriented to person, place, and time.  Skin: Skin is warm and dry.  Psychiatric: He has a normal mood and affect.    Labs reviewed: Basic Metabolic Panel:  Recent Labs  08/28/14 1228 11/24/14 1451 12/29/14 1449  NA 135 136  136 137  K 4.4 4.9  4.9 4.8  CL 102 103  103 100  CO2 26 25  25 21   GLUCOSE 154* 119*  119* 156*  BUN 43* 24*  24* 31*  CREATININE 1.7* 1.38*  1.38* 1.58*  CALCIUM 9.1 9.3  9.3 9.4   Liver Function Tests:  Recent Labs  03/02/14 0710 03/15/14 2019 03/16/14 0640 12/29/14 1449  AST 12 13 12 14   ALT 7 10 9 16   ALKPHOS 95 122*  98 119*  BILITOT 0.9 0.4 0.4 0.4  PROT 6.4 6.1 5.5* 6.7  ALBUMIN 2.6* 2.3* 2.2*  --    No results for input(s): LIPASE, AMYLASE in the last 8760 hours. No results for input(s): AMMONIA in the last 8760 hours. CBC:  Recent Labs  04/04/14 0430 04/05/14 0615 05/12/14  08/04/14 0600 11/24/14 1451 12/29/14 1449  WBC 7.8 7.4 7.4  --   --  9.8 10.7  NEUTROABS 5.1 4.5  --   --   --   --  7.3*  HGB 8.2* 8.3*  --   < > 10.5* 12.8* 13.2  HCT 24.6* 25.4*  --   < > 31.2* 36.5* 37.9  MCV 87.5 86.1  --   --   --  82.0 82  PLT 184 183 198  --   --  221  --   < > = values in this interval not displayed. Lipid Panel:  Recent Labs  12/29/14 1449  CHOL 149  HDL 32*  LDLCALC 88  TRIG 146  CHOLHDL 4.7   TSH: No results for input(s): TSH in the last 8760 hours. A1C: Lab Results  Component Value Date   HGBA1C 6.3* 12/29/2014     Assessment/Plan 1. Chronic back pain -controlled on current regimen  - Oxycodone HCl 10 MG TABS; Take 1 tablet (10 mg total) by mouth every 8 (eight) hours as needed. For pain  Dispense: 90 tablet; Refill: 0  2. Atrial fibrillation, unspecified -rate controlled, conts on eliquis dig and Cardizem  -dig was increased to 0.125 after low dig level, will follow up level  - Basic Metabolic Panel - Digoxin level  3. Anemia, unspecified anemia type -cbc reviewed with pt, anemia improved and  currently off all supplements  4. Gastroesophageal reflux disease without esophagitis No current symptoms to STOP omeprazole  5. Chronic kidney disease, unspecified stage Pt increased hydration, will follow up BMP  6. Osteoarthritis Episode of arthritis in finger and elbows, current pain medication helped. No additional episodes, will cont to monitor    To keep follow up with DR Mariea Clonts, follow up sooner as needed

## 2015-01-30 DIAGNOSIS — Z89519 Acquired absence of unspecified leg below knee: Secondary | ICD-10-CM | POA: Diagnosis not present

## 2015-01-30 DIAGNOSIS — R262 Difficulty in walking, not elsewhere classified: Secondary | ICD-10-CM | POA: Diagnosis not present

## 2015-01-30 DIAGNOSIS — T148 Other injury of unspecified body region: Secondary | ICD-10-CM | POA: Diagnosis not present

## 2015-01-30 LAB — BASIC METABOLIC PANEL
BUN/Creatinine Ratio: 16 (ref 10–22)
BUN: 26 mg/dL (ref 8–27)
CALCIUM: 8.9 mg/dL (ref 8.6–10.2)
CO2: 20 mmol/L (ref 18–29)
Chloride: 100 mmol/L (ref 97–108)
Creatinine, Ser: 1.63 mg/dL — ABNORMAL HIGH (ref 0.76–1.27)
GFR calc Af Amer: 46 mL/min/{1.73_m2} — ABNORMAL LOW (ref >59)
GFR, EST NON AFRICAN AMERICAN: 40 mL/min/{1.73_m2} — AB (ref >59)
GLUCOSE: 165 mg/dL — AB (ref 65–99)
Potassium: 4.8 mmol/L (ref 3.5–5.2)
Sodium: 136 mmol/L (ref 134–144)

## 2015-01-30 LAB — DIGOXIN LEVEL: Digoxin Level: 1.2 ng/mL (ref 0.9–2.0)

## 2015-02-02 DIAGNOSIS — Z89519 Acquired absence of unspecified leg below knee: Secondary | ICD-10-CM | POA: Diagnosis not present

## 2015-02-02 DIAGNOSIS — T148 Other injury of unspecified body region: Secondary | ICD-10-CM | POA: Diagnosis not present

## 2015-02-02 DIAGNOSIS — R262 Difficulty in walking, not elsewhere classified: Secondary | ICD-10-CM | POA: Diagnosis not present

## 2015-02-06 ENCOUNTER — Encounter: Payer: Self-pay | Admitting: Podiatrist

## 2015-02-06 ENCOUNTER — Ambulatory Visit (INDEPENDENT_AMBULATORY_CARE_PROVIDER_SITE_OTHER): Payer: Medicare Other | Admitting: Podiatrist

## 2015-02-06 DIAGNOSIS — M79676 Pain in unspecified toe(s): Secondary | ICD-10-CM

## 2015-02-06 DIAGNOSIS — R262 Difficulty in walking, not elsewhere classified: Secondary | ICD-10-CM | POA: Diagnosis not present

## 2015-02-06 DIAGNOSIS — B351 Tinea unguium: Secondary | ICD-10-CM

## 2015-02-06 DIAGNOSIS — Z89519 Acquired absence of unspecified leg below knee: Secondary | ICD-10-CM | POA: Diagnosis not present

## 2015-02-06 DIAGNOSIS — E1140 Type 2 diabetes mellitus with diabetic neuropathy, unspecified: Secondary | ICD-10-CM

## 2015-02-06 DIAGNOSIS — T148 Other injury of unspecified body region: Secondary | ICD-10-CM | POA: Diagnosis not present

## 2015-02-06 DIAGNOSIS — I739 Peripheral vascular disease, unspecified: Secondary | ICD-10-CM

## 2015-02-06 NOTE — Progress Notes (Signed)
     HPI: Patient presents today for follow up of diabetic foot and nail care. Past medical history, meds, and allergies reviewed. Patient states blood sugar is under good  control.  Patient has a right lower extremity amputation.  He presents in a wheelchair.    Objective:   Objective:  Patients chart is reviewed.  Vascular status reveals pedal pulses noted at  1 out of 4 dp and 0/4 pt   Neurological sensation is Decreased to [employer] monofilament bilateral at 1/5 sites left.  Left foot toenails are elongated, incurvated, discolored, dystrophic x 5 left foot   Assessment: Diabetes with Neuropathy, symptomatic toenails x 5 left foot.  Plan: debridment of left foot toenails accomplished today without incidence. He will be seen back every three months for followup and contined pallative care.

## 2015-02-10 DIAGNOSIS — R262 Difficulty in walking, not elsewhere classified: Secondary | ICD-10-CM | POA: Diagnosis not present

## 2015-02-10 DIAGNOSIS — Z89519 Acquired absence of unspecified leg below knee: Secondary | ICD-10-CM | POA: Diagnosis not present

## 2015-02-10 DIAGNOSIS — T148 Other injury of unspecified body region: Secondary | ICD-10-CM | POA: Diagnosis not present

## 2015-02-13 DIAGNOSIS — T148 Other injury of unspecified body region: Secondary | ICD-10-CM | POA: Diagnosis not present

## 2015-02-13 DIAGNOSIS — R262 Difficulty in walking, not elsewhere classified: Secondary | ICD-10-CM | POA: Diagnosis not present

## 2015-02-13 DIAGNOSIS — Z89519 Acquired absence of unspecified leg below knee: Secondary | ICD-10-CM | POA: Diagnosis not present

## 2015-02-18 DIAGNOSIS — Z89519 Acquired absence of unspecified leg below knee: Secondary | ICD-10-CM | POA: Diagnosis not present

## 2015-02-18 DIAGNOSIS — R262 Difficulty in walking, not elsewhere classified: Secondary | ICD-10-CM | POA: Diagnosis not present

## 2015-02-18 DIAGNOSIS — T148 Other injury of unspecified body region: Secondary | ICD-10-CM | POA: Diagnosis not present

## 2015-02-19 DIAGNOSIS — Z89611 Acquired absence of right leg above knee: Secondary | ICD-10-CM | POA: Diagnosis not present

## 2015-02-19 DIAGNOSIS — E1142 Type 2 diabetes mellitus with diabetic polyneuropathy: Secondary | ICD-10-CM | POA: Diagnosis not present

## 2015-02-19 DIAGNOSIS — I70262 Atherosclerosis of native arteries of extremities with gangrene, left leg: Secondary | ICD-10-CM | POA: Diagnosis not present

## 2015-02-19 DIAGNOSIS — L97421 Non-pressure chronic ulcer of left heel and midfoot limited to breakdown of skin: Secondary | ICD-10-CM | POA: Diagnosis not present

## 2015-02-20 DIAGNOSIS — Z89519 Acquired absence of unspecified leg below knee: Secondary | ICD-10-CM | POA: Diagnosis not present

## 2015-03-19 ENCOUNTER — Other Ambulatory Visit: Payer: Self-pay | Admitting: *Deleted

## 2015-03-19 DIAGNOSIS — M549 Dorsalgia, unspecified: Principal | ICD-10-CM

## 2015-03-19 DIAGNOSIS — G8929 Other chronic pain: Secondary | ICD-10-CM

## 2015-03-19 MED ORDER — OXYCODONE HCL 10 MG PO TABS: 10.0000 mg | ORAL_TABLET | Freq: Three times a day (TID) | ORAL | Status: DC | PRN

## 2015-03-19 NOTE — Telephone Encounter (Signed)
Patient requested and will pick up 

## 2015-03-22 DIAGNOSIS — Z89519 Acquired absence of unspecified leg below knee: Secondary | ICD-10-CM | POA: Diagnosis not present

## 2015-03-23 ENCOUNTER — Other Ambulatory Visit: Payer: Medicare Other

## 2015-03-23 DIAGNOSIS — I4891 Unspecified atrial fibrillation: Secondary | ICD-10-CM

## 2015-03-24 LAB — DIGOXIN LEVEL: Digoxin Level: 1.4 ng/mL (ref 0.9–2.0)

## 2015-03-26 DIAGNOSIS — Z89611 Acquired absence of right leg above knee: Secondary | ICD-10-CM | POA: Diagnosis not present

## 2015-03-26 DIAGNOSIS — E1142 Type 2 diabetes mellitus with diabetic polyneuropathy: Secondary | ICD-10-CM | POA: Diagnosis not present

## 2015-03-26 DIAGNOSIS — I70262 Atherosclerosis of native arteries of extremities with gangrene, left leg: Secondary | ICD-10-CM | POA: Diagnosis not present

## 2015-03-26 DIAGNOSIS — L97421 Non-pressure chronic ulcer of left heel and midfoot limited to breakdown of skin: Secondary | ICD-10-CM | POA: Diagnosis not present

## 2015-04-01 ENCOUNTER — Other Ambulatory Visit: Payer: Self-pay

## 2015-04-01 MED ORDER — INSULIN PEN NEEDLE 31G X 8 MM MISC: Status: DC

## 2015-04-14 DIAGNOSIS — E119 Type 2 diabetes mellitus without complications: Secondary | ICD-10-CM | POA: Diagnosis not present

## 2015-04-14 DIAGNOSIS — I10 Essential (primary) hypertension: Secondary | ICD-10-CM | POA: Diagnosis not present

## 2015-04-14 DIAGNOSIS — R269 Unspecified abnormalities of gait and mobility: Secondary | ICD-10-CM | POA: Diagnosis not present

## 2015-04-14 DIAGNOSIS — S88911D Complete traumatic amputation of right lower leg, level unspecified, subsequent encounter: Secondary | ICD-10-CM | POA: Diagnosis not present

## 2015-04-14 DIAGNOSIS — I739 Peripheral vascular disease, unspecified: Secondary | ICD-10-CM | POA: Diagnosis not present

## 2015-04-22 DIAGNOSIS — Z89519 Acquired absence of unspecified leg below knee: Secondary | ICD-10-CM | POA: Diagnosis not present

## 2015-04-24 DIAGNOSIS — I739 Peripheral vascular disease, unspecified: Secondary | ICD-10-CM | POA: Diagnosis not present

## 2015-04-24 DIAGNOSIS — I10 Essential (primary) hypertension: Secondary | ICD-10-CM | POA: Diagnosis not present

## 2015-04-24 DIAGNOSIS — S88911D Complete traumatic amputation of right lower leg, level unspecified, subsequent encounter: Secondary | ICD-10-CM | POA: Diagnosis not present

## 2015-04-24 DIAGNOSIS — E119 Type 2 diabetes mellitus without complications: Secondary | ICD-10-CM | POA: Diagnosis not present

## 2015-04-24 DIAGNOSIS — R269 Unspecified abnormalities of gait and mobility: Secondary | ICD-10-CM | POA: Diagnosis not present

## 2015-04-28 ENCOUNTER — Other Ambulatory Visit: Payer: Self-pay | Admitting: *Deleted

## 2015-04-28 DIAGNOSIS — I482 Chronic atrial fibrillation, unspecified: Secondary | ICD-10-CM

## 2015-04-28 DIAGNOSIS — M549 Dorsalgia, unspecified: Principal | ICD-10-CM

## 2015-04-28 DIAGNOSIS — G8929 Other chronic pain: Secondary | ICD-10-CM

## 2015-04-28 MED ORDER — DIGOXIN 125 MCG PO TABS: 0.1250 mg | ORAL_TABLET | Freq: Every day | ORAL | Status: DC

## 2015-04-28 MED ORDER — OXYCODONE HCL 10 MG PO TABS: 10.0000 mg | ORAL_TABLET | Freq: Three times a day (TID) | ORAL | Status: DC | PRN

## 2015-05-01 DIAGNOSIS — E119 Type 2 diabetes mellitus without complications: Secondary | ICD-10-CM | POA: Diagnosis not present

## 2015-05-01 DIAGNOSIS — R269 Unspecified abnormalities of gait and mobility: Secondary | ICD-10-CM | POA: Diagnosis not present

## 2015-05-01 DIAGNOSIS — S88911D Complete traumatic amputation of right lower leg, level unspecified, subsequent encounter: Secondary | ICD-10-CM | POA: Diagnosis not present

## 2015-05-01 DIAGNOSIS — I10 Essential (primary) hypertension: Secondary | ICD-10-CM | POA: Diagnosis not present

## 2015-05-01 DIAGNOSIS — I739 Peripheral vascular disease, unspecified: Secondary | ICD-10-CM | POA: Diagnosis not present

## 2015-05-05 DIAGNOSIS — I739 Peripheral vascular disease, unspecified: Secondary | ICD-10-CM | POA: Diagnosis not present

## 2015-05-05 DIAGNOSIS — I10 Essential (primary) hypertension: Secondary | ICD-10-CM | POA: Diagnosis not present

## 2015-05-05 DIAGNOSIS — E119 Type 2 diabetes mellitus without complications: Secondary | ICD-10-CM | POA: Diagnosis not present

## 2015-05-05 DIAGNOSIS — R269 Unspecified abnormalities of gait and mobility: Secondary | ICD-10-CM | POA: Diagnosis not present

## 2015-05-05 DIAGNOSIS — S88911D Complete traumatic amputation of right lower leg, level unspecified, subsequent encounter: Secondary | ICD-10-CM | POA: Diagnosis not present

## 2015-05-08 DIAGNOSIS — E119 Type 2 diabetes mellitus without complications: Secondary | ICD-10-CM | POA: Diagnosis not present

## 2015-05-08 DIAGNOSIS — I739 Peripheral vascular disease, unspecified: Secondary | ICD-10-CM | POA: Diagnosis not present

## 2015-05-08 DIAGNOSIS — I10 Essential (primary) hypertension: Secondary | ICD-10-CM | POA: Diagnosis not present

## 2015-05-08 DIAGNOSIS — S88911D Complete traumatic amputation of right lower leg, level unspecified, subsequent encounter: Secondary | ICD-10-CM | POA: Diagnosis not present

## 2015-05-08 DIAGNOSIS — R269 Unspecified abnormalities of gait and mobility: Secondary | ICD-10-CM | POA: Diagnosis not present

## 2015-05-12 ENCOUNTER — Telehealth: Payer: Self-pay | Admitting: Licensed Clinical Social Worker

## 2015-05-12 DIAGNOSIS — S88911D Complete traumatic amputation of right lower leg, level unspecified, subsequent encounter: Secondary | ICD-10-CM | POA: Diagnosis not present

## 2015-05-12 DIAGNOSIS — I10 Essential (primary) hypertension: Secondary | ICD-10-CM | POA: Diagnosis not present

## 2015-05-12 DIAGNOSIS — R269 Unspecified abnormalities of gait and mobility: Secondary | ICD-10-CM | POA: Diagnosis not present

## 2015-05-12 DIAGNOSIS — E119 Type 2 diabetes mellitus without complications: Secondary | ICD-10-CM | POA: Diagnosis not present

## 2015-05-12 DIAGNOSIS — I739 Peripheral vascular disease, unspecified: Secondary | ICD-10-CM | POA: Diagnosis not present

## 2015-05-12 NOTE — Telephone Encounter (Signed)
Patient called very anxious about a spot on his foot and he wants to see Dr. Tommy Medal, but there is nothing available. Patient wants to be worked in. Please advise

## 2015-05-13 ENCOUNTER — Ambulatory Visit: Payer: Medicare Other | Admitting: Infectious Disease

## 2015-05-13 NOTE — Telephone Encounter (Signed)
Let me know what transpired after our conversation today

## 2015-05-13 NOTE — Telephone Encounter (Signed)
Spoke with patient to let him know we did not have any available appointment with Dr. Johnnye Sima or Dr. Tommy Medal until 05/26/15, patient was upset. I discussed with the patient that he could see his PCP or Podiatrist. He has a Podiatrist appointment on 6/27 and he will let them look at it.

## 2015-05-14 NOTE — Telephone Encounter (Signed)
VERY GOOD

## 2015-05-14 NOTE — Telephone Encounter (Signed)
Patient has an appointment with his Podiatrist Dr. [name] and he will have him take a look at it, and if it is suspicious then the patient or Dr. [name] will give [place] a call.

## 2015-05-15 DIAGNOSIS — R269 Unspecified abnormalities of gait and mobility: Secondary | ICD-10-CM | POA: Diagnosis not present

## 2015-05-15 DIAGNOSIS — I10 Essential (primary) hypertension: Secondary | ICD-10-CM | POA: Diagnosis not present

## 2015-05-15 DIAGNOSIS — I739 Peripheral vascular disease, unspecified: Secondary | ICD-10-CM | POA: Diagnosis not present

## 2015-05-15 DIAGNOSIS — E119 Type 2 diabetes mellitus without complications: Secondary | ICD-10-CM | POA: Diagnosis not present

## 2015-05-15 DIAGNOSIS — S88911D Complete traumatic amputation of right lower leg, level unspecified, subsequent encounter: Secondary | ICD-10-CM | POA: Diagnosis not present

## 2015-05-17 ENCOUNTER — Other Ambulatory Visit: Payer: Self-pay | Admitting: Nurse Practitioner

## 2015-05-18 ENCOUNTER — Encounter: Payer: Self-pay | Admitting: Podiatry

## 2015-05-18 ENCOUNTER — Ambulatory Visit (INDEPENDENT_AMBULATORY_CARE_PROVIDER_SITE_OTHER): Payer: Medicare Other

## 2015-05-18 ENCOUNTER — Ambulatory Visit (INDEPENDENT_AMBULATORY_CARE_PROVIDER_SITE_OTHER): Payer: Medicare Other | Admitting: Podiatry

## 2015-05-18 DIAGNOSIS — Q828 Other specified congenital malformations of skin: Secondary | ICD-10-CM

## 2015-05-18 DIAGNOSIS — M79673 Pain in unspecified foot: Secondary | ICD-10-CM

## 2015-05-18 DIAGNOSIS — E1140 Type 2 diabetes mellitus with diabetic neuropathy, unspecified: Secondary | ICD-10-CM | POA: Diagnosis not present

## 2015-05-18 DIAGNOSIS — L97411 Non-pressure chronic ulcer of right heel and midfoot limited to breakdown of skin: Secondary | ICD-10-CM

## 2015-05-18 DIAGNOSIS — B351 Tinea unguium: Secondary | ICD-10-CM | POA: Diagnosis not present

## 2015-05-19 ENCOUNTER — Telehealth: Payer: Self-pay | Admitting: *Deleted

## 2015-05-19 DIAGNOSIS — E119 Type 2 diabetes mellitus without complications: Secondary | ICD-10-CM | POA: Diagnosis not present

## 2015-05-19 DIAGNOSIS — S88911D Complete traumatic amputation of right lower leg, level unspecified, subsequent encounter: Secondary | ICD-10-CM | POA: Diagnosis not present

## 2015-05-19 DIAGNOSIS — I10 Essential (primary) hypertension: Secondary | ICD-10-CM | POA: Diagnosis not present

## 2015-05-19 DIAGNOSIS — R269 Unspecified abnormalities of gait and mobility: Secondary | ICD-10-CM | POA: Diagnosis not present

## 2015-05-19 DIAGNOSIS — I739 Peripheral vascular disease, unspecified: Secondary | ICD-10-CM | POA: Diagnosis not present

## 2015-05-19 NOTE — Telephone Encounter (Signed)
Patient called stating he was seen by his podiatrist who wanted to order a  venous doppler on his leg and he told him no because he wanted Dr. Johnnye Sima of Dr. Tommy Medal to order it. He was told by podiatry that he did not have an infection and an xray was done. I explained that if the podiatrist wanted to refer him here he could do that; but that we could not take an order from an MD that was not associated with [facility]. He said that was ridiculous because it was at his request. I then explained that this office sees patient's by referral and he said that was too much trouble and he would call the podiatrist back to request this test.

## 2015-05-20 ENCOUNTER — Telehealth: Payer: Self-pay | Admitting: *Deleted

## 2015-05-20 NOTE — Telephone Encounter (Signed)
Patient called and stated that he saw his Podiatrist at [facility] and he found a spot on his big toe on Left leg. Patient is an amputee and stated that he wants to keep his leg and wants to make sure there is no problem with it. The Podiatrist recommended hem to have a arterial circulation test arranged by his PCP and patient is calling wanting [place] to set this up. Doesn't want an appointment. Infectious Disease Dr. , patient stated, will not make the appointment for him. Please Advise.

## 2015-05-20 NOTE — Telephone Encounter (Signed)
Needs appt for this for insurance purposes

## 2015-05-20 NOTE — Progress Notes (Signed)
Subjective:     Patient ID: [name], male   DOB: 1935/05/09, 80 y.o.   MRN: [id number]  HPI patient presents with nail disease and thickness 1-5 both feet that are painful and he cannot cut   Review of Systems     Objective:   Physical Exam Neurovascular status intact thick yellow brittle nailbeds 1-5 both feet with pain    Assessment:     Mycotic nail infection with pain 1-5 both feet    Plan:     Debride painful nailbeds 1-5 both feet with no iatrogenic bleeding noted

## 2015-05-20 NOTE — Telephone Encounter (Signed)
Patient notified and agreed. Appointment scheduled for 6/30 with Janett Billow

## 2015-05-21 ENCOUNTER — Ambulatory Visit (INDEPENDENT_AMBULATORY_CARE_PROVIDER_SITE_OTHER): Payer: Medicare Other | Admitting: Nurse Practitioner

## 2015-05-21 ENCOUNTER — Encounter: Payer: Self-pay | Admitting: Nurse Practitioner

## 2015-05-21 VITALS — BP 134/58 | HR 69 | Temp 98.5°F | Resp 18

## 2015-05-21 DIAGNOSIS — L97529 Non-pressure chronic ulcer of other part of left foot with unspecified severity: Secondary | ICD-10-CM

## 2015-05-21 DIAGNOSIS — M549 Dorsalgia, unspecified: Secondary | ICD-10-CM

## 2015-05-21 DIAGNOSIS — I482 Chronic atrial fibrillation, unspecified: Secondary | ICD-10-CM

## 2015-05-21 DIAGNOSIS — L570 Actinic keratosis: Secondary | ICD-10-CM | POA: Diagnosis not present

## 2015-05-21 DIAGNOSIS — G8929 Other chronic pain: Secondary | ICD-10-CM | POA: Diagnosis not present

## 2015-05-21 DIAGNOSIS — L821 Other seborrheic keratosis: Secondary | ICD-10-CM | POA: Diagnosis not present

## 2015-05-21 DIAGNOSIS — Z85828 Personal history of other malignant neoplasm of skin: Secondary | ICD-10-CM | POA: Diagnosis not present

## 2015-05-21 MED ORDER — DILTIAZEM HCL ER COATED BEADS 180 MG PO CP24: 180.0000 mg | ORAL_CAPSULE | Freq: Every day | ORAL | Status: DC

## 2015-05-21 MED ORDER — DOXYCYCLINE HYCLATE 100 MG PO TABS: 100.0000 mg | ORAL_TABLET | Freq: Two times a day (BID) | ORAL | Status: DC

## 2015-05-21 MED ORDER — OXYCODONE HCL 10 MG PO TABS: 10.0000 mg | ORAL_TABLET | Freq: Three times a day (TID) | ORAL | Status: DC | PRN

## 2015-05-21 MED ORDER — LISINOPRIL 10 MG PO TABS: ORAL_TABLET | ORAL | Status: DC

## 2015-05-21 MED ORDER — DIGOXIN 125 MCG PO TABS: 0.1250 mg | ORAL_TABLET | Freq: Every day | ORAL | Status: DC

## 2015-05-21 MED ORDER — APIXABAN 5 MG PO TABS: 5.0000 mg | ORAL_TABLET | Freq: Every day | ORAL | Status: DC

## 2015-05-21 NOTE — Patient Instructions (Addendum)
Start Doxycyline 100 mg twice daily for 10 days  Follow up in 2 weeks with Dr Nyoka Cowden

## 2015-05-21 NOTE — Progress Notes (Signed)
Patient ID: Synthia Innocent, male   DOB: 27-Apr-1935, 80 y.o.   MRN: [id number]    PCP: Lauree Chandler, NP  Allergies  Allergen Reactions  . Morphine Rash and Other (See Comments)    Irritability also    Chief Complaint  Patient presents with  . Acute Visit    needs a arterial studies     HPI: Patient is a 80 y.o. male seen in the office today due to request for arterial circulation test. Martin Majestic to podiatrist, spot on great toe. Podiatrist trimmed toe and debrided area. wife reports toe has changed in the last 24 hours, had had drainage and foul odor.  Pt with eschar to great toe for 3 months, last night was yellow and mushy per wife.  No pain Increased redness overnight  no fever or chills  Pt called infectious diease to follow up.  Had been wearing shoe all the time at home, has started to let the foot rest without the shoe yesterday  Review of Systems:  Review of Systems  Constitutional: Negative for activity change, appetite change, fatigue and unexpected weight change.  Eyes: Negative.   Respiratory: Negative for cough and shortness of breath.   Cardiovascular: Negative for chest pain, palpitations and leg swelling.  Gastrointestinal: Negative for abdominal pain, diarrhea and constipation.  Skin: Positive for wound (to great toe- left). Negative for color change.  Neurological: Negative for dizziness and weakness.  Psychiatric/Behavioral: Negative for confusion.    Past Medical History  Diagnosis Date  . Fatigue   . Overweight(278.02)   . Syncope and collapse   . GERD (gastroesophageal reflux disease)   . Right bundle branch block   . Unspecified venous (peripheral) insufficiency   . Phlebitis and thrombophlebitis of superficial vessels of lower extremities   . Cellulitis and abscess of leg, except foot   . Backache, unspecified   . Long term (current) use of anticoagulants   . Type II or unspecified type diabetes mellitus without mention of complication, not  stated as uncontrolled   . Unspecified venous (peripheral) insufficiency   . Hypertension   . Gout, unspecified   . Heart failure   . Atrial fibrillation     permanent  . Esophageal stricture   . Hx of colonic polyps 05-2006    (Adenomatous)Dr. [name]  . Diverticulosis 05-2006    Dr. [name]   . Tachycardia-bradycardia     s/p PPM  . Pacemaker- [name] 02/20/2013  . CHF (congestive heart failure)   . Skin cancer     squamous cell carcinoma scalp  . Hx of radiation therapy 06/16/14- 07/18/14    mult squamous cell carcinomas of scalp   Past Surgical History  Procedure Laterality Date  . Laminectomy    . Lumbar fusion    . Rotator cuff repair    . Ruptured rt rectus muscle    . Pacemaker insertion  8/12    SJM by [name] for tachy/brady syndrome  . I&d extremity Right 01/17/2014    Procedure: IRRIGATION AND DEBRIDEMENT RIGHT HEEL  WITH CULTURES AND BONE BIOPSY, placement of wound vac;  Surgeon: Theodoro Kos, DO;  Location: [place];  Service: Plastics;  Laterality: Right;  . Amputation Right 01/20/2014    Procedure: AMPUTATION BELOW KNEE- right;  Surgeon: [name], MD;  Location: [place];  Service: Orthopedics;  Laterality: Right;  Right Below Knee Amputation  . Amputation Right 02/14/2014    Procedure: AMPUTATION BELOW KNEE;  Surgeon: [name],  MD;  Location: [place];  Service: Orthopedics;  Laterality: Right;  Right Below Knee Amputation Revision  . Amputation Right 03/07/2014    Procedure: AMPUTATION BELOW KNEE;  Surgeon: [name], MD;  Location: [place];  Service: Orthopedics;  Laterality: Right;  Right Below Knee Amputation Revision  . Amputation Right 03/18/2014    Procedure: revision of  AMPUTATION BELOW KNEE;  Surgeon: [name], MD;  Location: [place];  Service: Orthopedics;  Laterality: Right;  Revision Right Below Knee Amputation  . Above knee leg amputation Right 04/02/2014    DR DUDA  . Amputation Right 04/02/2014    Procedure: AMPUTATION ABOVE KNEE;  Surgeon:  [name], MD;  Location: [place];  Service: Orthopedics;  Laterality: Right;  Right Above Knee Amputation  . Skin biopsy      scalp  . Eye surgery     Social History:   reports that he has quit smoking. He has never used smokeless tobacco. He reports that he does not drink alcohol or use illicit drugs.  Family History  Problem Relation Age of Onset  . Pneumonia Mother   . Heart disease Father   . Diabetes Father   . Colon cancer Neg Hx     Medications: Patient's Medications  New Prescriptions   No medications on file  Previous Medications   ACCU-CHEK SMARTVIEW TEST STRIP       APIXABAN (ELIQUIS) 5 MG TABS TABLET    Take 1 tablet (5 mg total) by mouth daily.   ASSURE COMFORT LANCETS 30G MISC       BLOOD GLUCOSE CALIBRATION (ACCU-CHEK SMARTVIEW CONTROL) LIQD       DIGOXIN (LANOXIN) 0.125 MG TABLET    Take 1 tablet (0.125 mg total) by mouth daily.   DILTIAZEM (CARDIZEM CD) 180 MG 24 HR CAPSULE    Take 1 capsule (180 mg total) by mouth daily.   FUROSEMIDE (LASIX) 20 MG TABLET    Take 1 tablet (20 mg total) by mouth every other day.   GABAPENTIN (NEURONTIN) 100 MG CAPSULE    Take 1 capsule (100 mg total) by mouth every 12 (twelve) hours.   GLIMEPIRIDE (AMARYL) 2 MG TABLET    Take 1 tablet (2 mg total) by mouth daily with breakfast.   HUMALOG KWIKPEN 100 UNIT/ML KIWKPEN    INJECT 8 UNITS BEFORE MEALS-HOLD FOR BLOOD SUGAR LESS THAN 100   INSULIN PEN NEEDLE (B-D ULTRAFINE III SHORT PEN) 31G X 8 MM MISC    Use two times daily the the administration of insulin E11.9   INSULIN SYRINGE-NEEDLE U-100 (INSULIN SYRINGE .3CC/31GX5/16") 31G X 5/16" 0.3 ML MISC    0.3 mL 31 x 5/16"   LANTUS SOLOSTAR 100 UNIT/ML SOLOSTAR PEN    INJECT 24 UNITS AT BEDTIME   LISINOPRIL (PRINIVIL,ZESTRIL) 10 MG TABLET    TAKE 1 TABLET EACH DAY.   MELATONIN 5 MG TABS    Take 5 mg by mouth at bedtime as needed (insomnia).   NUTRITIONAL SUPPLEMENTS (FEEDING SUPPLEMENT, GLUCERNA 1.2 CAL,) LIQD    Take 237 mLs by mouth  daily as needed.    OXYCODONE HCL 10 MG TABS    Take 1 tablet (10 mg total) by mouth every 8 (eight) hours as needed. For pain   SENNA-DOCUSATE (SENNA-S) 8.6-50 MG PER TABLET    Take 1 tablet by mouth daily as needed.   Modified Medications   No medications on file  Discontinued Medications   No medications on file  Physical Exam:  Filed Vitals:   05/21/15 1359  BP: 134/58  Pulse: 69  Temp: 98.5 F (36.9 C)  TempSrc: Oral  Resp: 18  SpO2: 96%    Physical Exam  Constitutional: He is oriented to person, place, and time. He appears well-developed and well-nourished. No distress.  Neck: Normal range of motion. Neck supple.  Cardiovascular: Normal rate, regular rhythm and normal heart sounds.   Pulmonary/Chest: Effort normal and breath sounds normal. No respiratory distress.  Abdominal: Soft. Bowel sounds are normal.  Musculoskeletal: He exhibits no edema or tenderness.  Right AKA  Neurological: He is alert and oriented to person, place, and time.  Skin: Skin is warm and dry. Bruising:    0.5 x 0.5 circular ulcer to great toe with eschar, foul oder, slight redness noted to great toe only  Psychiatric: He has a normal mood and affect.    Labs reviewed: Basic Metabolic Panel:  Recent Labs  11/24/14 1451 12/29/14 1449 01/29/15 1618  NA 136  136 137 136  K 4.9  4.9 4.8 4.8  CL 103  103 100 100  CO2 25  25 21 20   GLUCOSE 119*  119* 156* 165*  BUN 24*  24* 31* 26  CREATININE 1.38*  1.38* 1.58* 1.63*  CALCIUM 9.3  9.3 9.4 8.9   Liver Function Tests:  Recent Labs  12/29/14 1449  AST 14  ALT 16  ALKPHOS 119*  BILITOT 0.4  PROT 6.7   No results for input(s): LIPASE, AMYLASE in the last 8760 hours. No results for input(s): AMMONIA in the last 8760 hours. CBC:  Recent Labs  08/04/14 0600 11/24/14 1451 12/29/14 1449  WBC  --  9.8 10.7  NEUTROABS  --   --  7.3*  HGB 10.5* 12.8* 13.2  HCT 31.2* 36.5* 37.9  MCV  --  82.0 82  PLT  --  221  --     Lipid Panel:  Recent Labs  12/29/14 1449  CHOL 149  HDL 32*  LDLCALC 88  TRIG 146  CHOLHDL 4.7   TSH: No results for input(s): TSH in the last 8760 hours. A1C: Lab Results  Component Value Date   HGBA1C 6.3* 12/29/2014     Assessment/Plan  1. Ulcer of great toe, left, with unspecified severity -with eschar, pt had ABIs in January 2015, reviewed will have them faxed to podiatrist - will start doxycycline (VIBRA-TABS) 100 MG tablet; Take 1 tablet (100 mg total) by mouth 2 (two) times daily.  Dispense: 20 tablet; Refill: 0 -follow up in 2 weeks  -follow up sooner if needed for changes in wound, increased redness or pain   Johnnie Moten K. Harle Battiest  [facility] [phone] 8 am - 5 pm) [phone] (after hours)

## 2015-05-22 DIAGNOSIS — I739 Peripheral vascular disease, unspecified: Secondary | ICD-10-CM | POA: Diagnosis not present

## 2015-05-22 DIAGNOSIS — E119 Type 2 diabetes mellitus without complications: Secondary | ICD-10-CM | POA: Diagnosis not present

## 2015-05-22 DIAGNOSIS — Z89519 Acquired absence of unspecified leg below knee: Secondary | ICD-10-CM | POA: Diagnosis not present

## 2015-05-22 DIAGNOSIS — I10 Essential (primary) hypertension: Secondary | ICD-10-CM | POA: Diagnosis not present

## 2015-05-22 DIAGNOSIS — R269 Unspecified abnormalities of gait and mobility: Secondary | ICD-10-CM | POA: Diagnosis not present

## 2015-05-22 DIAGNOSIS — S88911D Complete traumatic amputation of right lower leg, level unspecified, subsequent encounter: Secondary | ICD-10-CM | POA: Diagnosis not present

## 2015-05-26 ENCOUNTER — Other Ambulatory Visit: Payer: Self-pay

## 2015-05-26 DIAGNOSIS — E1159 Type 2 diabetes mellitus with other circulatory complications: Secondary | ICD-10-CM

## 2015-05-26 MED ORDER — INSULIN LISPRO 100 UNIT/ML (KWIKPEN): PEN_INJECTOR | SUBCUTANEOUS | Status: DC

## 2015-05-26 MED ORDER — GLIMEPIRIDE 2 MG PO TABS: 2.0000 mg | ORAL_TABLET | Freq: Every day | ORAL | Status: DC

## 2015-05-28 ENCOUNTER — Other Ambulatory Visit: Payer: Self-pay | Admitting: Podiatry

## 2015-05-28 ENCOUNTER — Telehealth: Payer: Self-pay | Admitting: *Deleted

## 2015-05-28 DIAGNOSIS — R0989 Other specified symptoms and signs involving the circulatory and respiratory systems: Secondary | ICD-10-CM

## 2015-05-28 DIAGNOSIS — L97301 Non-pressure chronic ulcer of unspecified ankle limited to breakdown of skin: Secondary | ICD-10-CM

## 2015-05-28 NOTE — Telephone Encounter (Addendum)
Pt states he thought he was to get an arterial doppler.  Dr. [name] states pt said he was scheduled for studies coming up, but if not order L arterial dopplers.  Pt called states would like to have the arterial doppler on the left leg at [facility] care.  I told him I would get the orders in and he should receive a call from them soon.

## 2015-05-29 ENCOUNTER — Ambulatory Visit (HOSPITAL_COMMUNITY): Payer: Medicare Other | Attending: Cardiovascular Disease

## 2015-05-29 DIAGNOSIS — L97301 Non-pressure chronic ulcer of unspecified ankle limited to breakdown of skin: Secondary | ICD-10-CM | POA: Diagnosis not present

## 2015-05-29 DIAGNOSIS — I10 Essential (primary) hypertension: Secondary | ICD-10-CM | POA: Diagnosis not present

## 2015-05-29 DIAGNOSIS — R0989 Other specified symptoms and signs involving the circulatory and respiratory systems: Secondary | ICD-10-CM | POA: Diagnosis not present

## 2015-05-29 DIAGNOSIS — Z89611 Acquired absence of right leg above knee: Secondary | ICD-10-CM | POA: Insufficient documentation

## 2015-05-29 DIAGNOSIS — E11621 Type 2 diabetes mellitus with foot ulcer: Secondary | ICD-10-CM | POA: Insufficient documentation

## 2015-05-29 DIAGNOSIS — E785 Hyperlipidemia, unspecified: Secondary | ICD-10-CM | POA: Insufficient documentation

## 2015-05-29 DIAGNOSIS — Z8673 Personal history of transient ischemic attack (TIA), and cerebral infarction without residual deficits: Secondary | ICD-10-CM | POA: Diagnosis not present

## 2015-05-29 DIAGNOSIS — L97529 Non-pressure chronic ulcer of other part of left foot with unspecified severity: Secondary | ICD-10-CM | POA: Diagnosis not present

## 2015-06-02 ENCOUNTER — Telehealth: Payer: Self-pay | Admitting: *Deleted

## 2015-06-02 ENCOUNTER — Encounter (HOSPITAL_COMMUNITY): Payer: Medicare Other

## 2015-06-02 NOTE — Telephone Encounter (Addendum)
Pt states he has the results of the arterial dopplers.  I left message I would put the results before Dr. [name] for orders.  Dr. [name] states pt's lower arterial dopplers are within normal ranges.  I informed pt's wife and she said she'd tell pt.

## 2015-06-04 ENCOUNTER — Ambulatory Visit: Payer: Medicare Other | Admitting: Nurse Practitioner

## 2015-06-05 DIAGNOSIS — M79672 Pain in left foot: Secondary | ICD-10-CM | POA: Diagnosis not present

## 2015-06-09 ENCOUNTER — Other Ambulatory Visit: Payer: Self-pay | Admitting: *Deleted

## 2015-06-09 DIAGNOSIS — G546 Phantom limb syndrome with pain: Secondary | ICD-10-CM

## 2015-06-09 MED ORDER — GABAPENTIN 100 MG PO CAPS: 100.0000 mg | ORAL_CAPSULE | Freq: Two times a day (BID) | ORAL | Status: DC

## 2015-06-09 NOTE — Telephone Encounter (Signed)
[facility]  

## 2015-06-11 ENCOUNTER — Ambulatory Visit (INDEPENDENT_AMBULATORY_CARE_PROVIDER_SITE_OTHER): Payer: Medicare Other | Admitting: Nurse Practitioner

## 2015-06-11 ENCOUNTER — Encounter: Payer: Self-pay | Admitting: Nurse Practitioner

## 2015-06-11 VITALS — BP 138/64 | HR 85 | Temp 98.0°F | Resp 20 | Ht 70.0 in | Wt 172.2 lb

## 2015-06-11 DIAGNOSIS — G8929 Other chronic pain: Secondary | ICD-10-CM | POA: Diagnosis not present

## 2015-06-11 DIAGNOSIS — S91302D Unspecified open wound, left foot, subsequent encounter: Secondary | ICD-10-CM | POA: Diagnosis not present

## 2015-06-11 DIAGNOSIS — L97529 Non-pressure chronic ulcer of other part of left foot with unspecified severity: Secondary | ICD-10-CM

## 2015-06-11 DIAGNOSIS — M549 Dorsalgia, unspecified: Secondary | ICD-10-CM

## 2015-06-11 MED ORDER — OXYCODONE HCL 10 MG PO TABS: 10.0000 mg | ORAL_TABLET | Freq: Three times a day (TID) | ORAL | Status: DC | PRN

## 2015-06-11 NOTE — Progress Notes (Signed)
Patient ID: [name], male   DOB: December 29, 1934, 80 y.o.   MRN: [id number]    PCP: Lauree Chandler, NP  Allergies  Allergen Reactions  . Morphine Rash and Other (See Comments)    Irritability also    Chief Complaint  Patient presents with  . Medical Management of Chronic Issues    foot check- red, bigger in size     HPI: Patient is a 80 y.o. male seen in the office today to follow up ulcer of great toe. Completed doxycyline and ulcer of great toe is doing better. Now pts ulcer to left lateral heel has gotten worse. Pt had lower arterial dopplers done which were unchanged from last year and normal. Dr Sharol Given previously following ulcer. In the last week has has gotten much worse. Saw another orthopedic doctor last week. Taking clindamycin TID for 10 days and following up with Dr Sharol Given in 5 days. No fever or chills. Area is draining, and some better since he has been on antibiotics   Review of Systems:  Review of Systems  Constitutional: Negative for activity change, appetite change, fatigue and unexpected weight change.  Eyes: Negative.   Respiratory: Negative for cough and shortness of breath.   Cardiovascular: Negative for chest pain, palpitations and leg swelling.  Gastrointestinal: Negative for abdominal pain, diarrhea and constipation.  Skin: Positive for wound (left lateral heel). Negative for color change.  Neurological: Negative for dizziness and weakness.  Psychiatric/Behavioral: Negative for confusion.    Past Medical History  Diagnosis Date  . Fatigue   . Overweight(278.02)   . Syncope and collapse   . GERD (gastroesophageal reflux disease)   . Right bundle branch block   . Unspecified venous (peripheral) insufficiency   . Phlebitis and thrombophlebitis of superficial vessels of lower extremities   . Cellulitis and abscess of leg, except foot   . Backache, unspecified   . Long term (current) use of anticoagulants   . Type II or unspecified type diabetes  mellitus without mention of complication, not stated as uncontrolled   . Unspecified venous (peripheral) insufficiency   . Hypertension   . Gout, unspecified   . Heart failure   . Atrial fibrillation     permanent  . Esophageal stricture   . Hx of colonic polyps 05-2006    (Adenomatous)Dr. [name]  . Diverticulosis 05-2006    Dr. [name]   . Tachycardia-bradycardia     s/p PPM  . Pacemaker- [name] 02/20/2013  . CHF (congestive heart failure)   . Skin cancer     squamous cell carcinoma scalp  . Hx of radiation therapy 06/16/14- 07/18/14    mult squamous cell carcinomas of scalp   Past Surgical History  Procedure Laterality Date  . Laminectomy    . Lumbar fusion    . Rotator cuff repair    . Ruptured rt rectus muscle    . Pacemaker insertion  8/12    SJM by [name] for tachy/brady syndrome  . I&d extremity Right 01/17/2014    Procedure: IRRIGATION AND DEBRIDEMENT RIGHT HEEL  WITH CULTURES AND BONE BIOPSY, placement of wound vac;  Surgeon: Theodoro Kos, DO;  Location: [place];  Service: Plastics;  Laterality: Right;  . Amputation Right 01/20/2014    Procedure: AMPUTATION BELOW KNEE- right;  Surgeon: [name], MD;  Location: [place];  Service: Orthopedics;  Laterality: Right;  Right Below Knee Amputation  . Amputation Right 02/14/2014    Procedure: AMPUTATION BELOW KNEE;  Surgeon:  [name], MD;  Location: [place];  Service: Orthopedics;  Laterality: Right;  Right Below Knee Amputation Revision  . Amputation Right 03/07/2014    Procedure: AMPUTATION BELOW KNEE;  Surgeon: [name], MD;  Location: [place];  Service: Orthopedics;  Laterality: Right;  Right Below Knee Amputation Revision  . Amputation Right 03/18/2014    Procedure: revision of  AMPUTATION BELOW KNEE;  Surgeon: [name], MD;  Location: [place];  Service: Orthopedics;  Laterality: Right;  Revision Right Below Knee Amputation  . Above knee leg amputation Right 04/02/2014    DR DUDA  . Amputation Right 04/02/2014     Procedure: AMPUTATION ABOVE KNEE;  Surgeon: [name], MD;  Location: [place];  Service: Orthopedics;  Laterality: Right;  Right Above Knee Amputation  . Skin biopsy      scalp  . Eye surgery     Social History:   reports that he has quit smoking. He has never used smokeless tobacco. He reports that he does not drink alcohol or use illicit drugs.  Family History  Problem Relation Age of Onset  . Pneumonia Mother   . Heart disease Father   . Diabetes Father   . Colon cancer Neg Hx     Medications: Patient's Medications  New Prescriptions   No medications on file  Previous Medications   ACCU-CHEK SMARTVIEW TEST STRIP       APIXABAN (ELIQUIS) 5 MG TABS TABLET    Take 1 tablet (5 mg total) by mouth daily.   ASSURE COMFORT LANCETS 30G MISC       BLOOD GLUCOSE CALIBRATION (ACCU-CHEK SMARTVIEW CONTROL) LIQD       CLINDAMYCIN (CLEOCIN) 300 MG CAPSULE    Take 1 capsule by mouth 3 (three) times daily with meals.   DIGOXIN (LANOXIN) 0.125 MG TABLET    Take 1 tablet (0.125 mg total) by mouth daily.   DILTIAZEM (CARDIZEM CD) 180 MG 24 HR CAPSULE    Take 1 capsule (180 mg total) by mouth daily.   DOXYCYCLINE (VIBRA-TABS) 100 MG TABLET    Take 1 tablet (100 mg total) by mouth 2 (two) times daily.   FUROSEMIDE (LASIX) 20 MG TABLET    Take 1 tablet (20 mg total) by mouth every other day.   GABAPENTIN (NEURONTIN) 100 MG CAPSULE    Take 1 capsule (100 mg total) by mouth every 12 (twelve) hours.   GLIMEPIRIDE (AMARYL) 2 MG TABLET    Take 1 tablet (2 mg total) by mouth daily with breakfast.   INSULIN LISPRO (HUMALOG KWIKPEN) 100 UNIT/ML KIWKPEN    INJECT 8 UNITS BEFORE MEALS-HOLD FOR BLOOD SUGAR LESS THAN 100   INSULIN PEN NEEDLE (B-D ULTRAFINE III SHORT PEN) 31G X 8 MM MISC    Use two times daily the the administration of insulin E11.9   INSULIN SYRINGE-NEEDLE U-100 (INSULIN SYRINGE .3CC/31GX5/16") 31G X 5/16" 0.3 ML MISC    0.3 mL 31 x 5/16"   LANTUS SOLOSTAR 100 UNIT/ML SOLOSTAR PEN    INJECT 24  UNITS AT BEDTIME   LISINOPRIL (PRINIVIL,ZESTRIL) 10 MG TABLET    TAKE 1 TABLET EACH DAY.   MELATONIN 5 MG TABS    Take 5 mg by mouth at bedtime as needed (insomnia).   NITROGLYCERIN (NITRODUR - DOSED IN MG/24 HR) 0.2 MG/HR PATCH    Place 1 patch onto the skin daily.   NUTRITIONAL SUPPLEMENTS (FEEDING SUPPLEMENT, GLUCERNA 1.2 CAL,) LIQD    Take 237 mLs by mouth  daily as needed.    SENNA-DOCUSATE (SENNA-S) 8.6-50 MG PER TABLET    Take 1 tablet by mouth daily as needed.   Modified Medications   Modified Medication Previous Medication   OXYCODONE HCL 10 MG TABS Oxycodone HCl 10 MG TABS      Take 1 tablet (10 mg total) by mouth every 8 (eight) hours as needed. For pain    Take 1 tablet (10 mg total) by mouth every 8 (eight) hours as needed. For pain  Discontinued Medications   No medications on file     Physical Exam:  Filed Vitals:   06/11/15 1443  BP: 138/64  Pulse: 85  Temp: 98 F (36.7 C)  TempSrc: Oral  Resp: 20  Height: 5\' 10"  (1.778 m)  Weight: 172 lb 3.2 oz (78.109 kg)  SpO2: 98%    Physical Exam  Constitutional: He is oriented to person, place, and time. He appears well-developed and well-nourished. No distress.  Neck: Normal range of motion. Neck supple.  Cardiovascular: Normal rate, regular rhythm and normal heart sounds.   Pulmonary/Chest: Effort normal and breath sounds normal. No respiratory distress.  Abdominal: Soft. Bowel sounds are normal.  Musculoskeletal: He exhibits no edema or tenderness.  Right AKA  Neurological: He is alert and oriented to person, place, and time.  Skin: Skin is warm and dry. Bruising:    0.5 x 0.5 circular ulcer to great toe with eschar, no redness or drainage 1 cm x. 25 cm oval wound to lateral aspect of left heel with serous drainage, no erythema noted    Psychiatric: He has a normal mood and affect.    Labs reviewed: Basic Metabolic Panel:  Recent Labs  11/24/14 1451 12/29/14 1449 01/29/15 1618  NA 136  136 137 136  K 4.9   4.9 4.8 4.8  CL 103  103 100 100  CO2 25  25 21 20   GLUCOSE 119*  119* 156* 165*  BUN 24*  24* 31* 26  CREATININE 1.38*  1.38* 1.58* 1.63*  CALCIUM 9.3  9.3 9.4 8.9   Liver Function Tests:  Recent Labs  12/29/14 1449  AST 14  ALT 16  ALKPHOS 119*  BILITOT 0.4  PROT 6.7   No results for input(s): LIPASE, AMYLASE in the last 8760 hours. No results for input(s): AMMONIA in the last 8760 hours. CBC:  Recent Labs  08/04/14 0600 11/24/14 1451 12/29/14 1449  WBC  --  9.8 10.7  NEUTROABS  --   --  7.3*  HGB 10.5* 12.8* 13.2  HCT 31.2* 36.5* 37.9  MCV  --  82.0 82  PLT  --  221  --    Lipid Panel:  Recent Labs  12/29/14 1449  CHOL 149  HDL 32*  LDLCALC 88  TRIG 146  CHOLHDL 4.7   TSH: No results for input(s): TSH in the last 8760 hours. A1C: Lab Results  Component Value Date   HGBA1C 6.3* 12/29/2014     Assessment/Plan 1. Chronic back pain Refill provided  - Oxycodone HCl 10 MG TABS; Take 1 tablet (10 mg total) by mouth every 8 (eight) hours as needed. For pain  Dispense: 90 tablet; Refill: 0   2. Ulcer of great toe, left, with unspecified severity -improved, no drainage or redness noted, eschar stable  3. Open wound of heel, left, subsequent encounter New draining left wound to lateral aspect of left heel. Previously seen by ortho due to similar wound. Saw earlier this week and was placed on  clindamycin, no erythema noted. Pt reports drainage has improved. Has follow up with Dr Sharol Given next week.    Keep follow up with ortho and Dr Sharee Holster K. Harle Battiest  [facility] [phone] 8 am - 5 pm) [phone] (after hours)

## 2015-06-15 DIAGNOSIS — I70262 Atherosclerosis of native arteries of extremities with gangrene, left leg: Secondary | ICD-10-CM | POA: Diagnosis not present

## 2015-06-15 DIAGNOSIS — E1142 Type 2 diabetes mellitus with diabetic polyneuropathy: Secondary | ICD-10-CM | POA: Diagnosis not present

## 2015-06-15 DIAGNOSIS — L97421 Non-pressure chronic ulcer of left heel and midfoot limited to breakdown of skin: Secondary | ICD-10-CM | POA: Diagnosis not present

## 2015-06-15 DIAGNOSIS — Z89611 Acquired absence of right leg above knee: Secondary | ICD-10-CM | POA: Diagnosis not present

## 2015-06-15 DIAGNOSIS — M79672 Pain in left foot: Secondary | ICD-10-CM | POA: Diagnosis not present

## 2015-06-22 DIAGNOSIS — Z89519 Acquired absence of unspecified leg below knee: Secondary | ICD-10-CM | POA: Diagnosis not present

## 2015-06-24 ENCOUNTER — Other Ambulatory Visit: Payer: Self-pay | Admitting: *Deleted

## 2015-06-24 NOTE — Telephone Encounter (Signed)
Patient changed his mind and wants to check with [place], will call back in the am.

## 2015-06-29 ENCOUNTER — Encounter: Payer: Self-pay | Admitting: Internal Medicine

## 2015-06-29 ENCOUNTER — Ambulatory Visit (INDEPENDENT_AMBULATORY_CARE_PROVIDER_SITE_OTHER): Payer: Medicare Other | Admitting: Internal Medicine

## 2015-06-29 VITALS — BP 136/68 | HR 76 | Temp 99.0°F | Resp 18 | Ht 70.0 in | Wt 171.0 lb

## 2015-06-29 DIAGNOSIS — I482 Chronic atrial fibrillation, unspecified: Secondary | ICD-10-CM

## 2015-06-29 DIAGNOSIS — E1151 Type 2 diabetes mellitus with diabetic peripheral angiopathy without gangrene: Secondary | ICD-10-CM

## 2015-06-29 DIAGNOSIS — L97529 Non-pressure chronic ulcer of other part of left foot with unspecified severity: Secondary | ICD-10-CM | POA: Diagnosis not present

## 2015-06-29 DIAGNOSIS — E1159 Type 2 diabetes mellitus with other circulatory complications: Secondary | ICD-10-CM

## 2015-06-29 DIAGNOSIS — I5032 Chronic diastolic (congestive) heart failure: Secondary | ICD-10-CM | POA: Diagnosis not present

## 2015-06-29 DIAGNOSIS — Z89611 Acquired absence of right leg above knee: Secondary | ICD-10-CM

## 2015-06-29 DIAGNOSIS — K219 Gastro-esophageal reflux disease without esophagitis: Secondary | ICD-10-CM

## 2015-06-29 DIAGNOSIS — G546 Phantom limb syndrome with pain: Secondary | ICD-10-CM

## 2015-06-29 DIAGNOSIS — S91302D Unspecified open wound, left foot, subsequent encounter: Secondary | ICD-10-CM | POA: Diagnosis not present

## 2015-06-29 DIAGNOSIS — Z23 Encounter for immunization: Secondary | ICD-10-CM | POA: Diagnosis not present

## 2015-06-29 NOTE — Progress Notes (Signed)
Patient ID: [name], male   DOB: 1935-04-20, 80 y.o.   MRN: [id number]   Location:  [facility]  Code Status: DNR Goals of Care: Advanced Directive information Does patient have an advance directive?: Yes, Type of Advance Directive: [place];Living will, Does patient want to make changes to advanced directive?: No - Patient declined   Chief Complaint  Patient presents with  . Medical Management of Chronic Issues    6 month follow-up    HPI: Patient is a 80 y.o. white male seen in the office today for medical mgt of chronic diseases.  He usually sees [place].    Left foot still problematic.  Working with Dr. Sharol Given.  1/4, left leg had flare up.  Went to see ID right away.  Wound up at hospital for CT instead of MRI due to pacemaker.  Ulcer seen but not infected.  Was put in compression socks with medicine built-in.  2 mos, ulcer gone, but now back.  Using duda sock on occasion.  Has washed them frequently.  Began the process Friday.  Completed abx from Dr. Xu--doxycycline.  Janett Billow had given him the clindamycin first.  Off both of these now.    CBGs have been good.  Lowest 74 and highest 221 once.  Takes it in the am and sometimes in the afternoon (if was not good in am).  116, 94, 114 last three fastings.    BP is good. Eating healthily.   Had a complete ultrasound of left leg done per Dr. Erlinda Hong.  05/29/15 at Dr. Olin Pia office.  Using ntg patches to speed up circulation in foot.  Had good circulation on the testing, he says.    Review of Systems:  Review of Systems  Constitutional: Negative for fever, chills and malaise/fatigue.  HENT: Negative for congestion.   Eyes: Negative for blurred vision.       Glasses  Respiratory: Negative for cough and shortness of breath.   Cardiovascular: Negative for chest pain and leg swelling.  Gastrointestinal: Negative for abdominal pain.  Genitourinary: Negative for dysuria.    Musculoskeletal: Negative for myalgias and falls.  Skin: Negative for rash.       2 ulcers left foot  Neurological: Positive for tingling and sensory change. Negative for weakness.       Phantom pain in right leg  Endo/Heme/Allergies: Bruises/bleeds easily.  Psychiatric/Behavioral: Negative for depression and memory loss. The patient is not nervous/anxious and does not have insomnia.        Has h/o alcohol abuse but none for a couple of years now    Past Medical History  Diagnosis Date  . Fatigue   . Overweight(278.02)   . Syncope and collapse   . GERD (gastroesophageal reflux disease)   . Right bundle branch block   . Unspecified venous (peripheral) insufficiency   . Phlebitis and thrombophlebitis of superficial vessels of lower extremities   . Cellulitis and abscess of leg, except foot   . Backache, unspecified   . Long term (current) use of anticoagulants   . Type II or unspecified type diabetes mellitus without mention of complication, not stated as uncontrolled   . Unspecified venous (peripheral) insufficiency   . Hypertension   . Gout, unspecified   . Heart failure   . Atrial fibrillation     permanent  . Esophageal stricture   . Hx of colonic polyps 05-2006    (Adenomatous)Dr. [name]  . Diverticulosis 05-2006  Dr. [name]   . Tachycardia-bradycardia     s/p PPM  . Pacemaker- [name] 02/20/2013  . CHF (congestive heart failure)   . Skin cancer     squamous cell carcinoma scalp  . Hx of radiation therapy 06/16/14- 07/18/14    mult squamous cell carcinomas of scalp    Past Surgical History  Procedure Laterality Date  . Laminectomy    . Lumbar fusion    . Rotator cuff repair    . Ruptured rt rectus muscle    . Pacemaker insertion  8/12    SJM by [name] for tachy/brady syndrome  . I&d extremity Right 01/17/2014    Procedure: IRRIGATION AND DEBRIDEMENT RIGHT HEEL  WITH CULTURES AND BONE BIOPSY, placement of wound vac;  Surgeon: Theodoro Kos, DO;  Location: [place];  Service: Plastics;  Laterality: Right;  . Amputation Right 01/20/2014    Procedure: AMPUTATION BELOW KNEE- right;  Surgeon: [name], MD;  Location: [place];  Service: Orthopedics;  Laterality: Right;  Right Below Knee Amputation  . Amputation Right 02/14/2014    Procedure: AMPUTATION BELOW KNEE;  Surgeon: [name], MD;  Location: [place];  Service: Orthopedics;  Laterality: Right;  Right Below Knee Amputation Revision  . Amputation Right 03/07/2014    Procedure: AMPUTATION BELOW KNEE;  Surgeon: [name], MD;  Location: [place];  Service: Orthopedics;  Laterality: Right;  Right Below Knee Amputation Revision  . Amputation Right 03/18/2014    Procedure: revision of  AMPUTATION BELOW KNEE;  Surgeon: [name], MD;  Location: [place];  Service: Orthopedics;  Laterality: Right;  Revision Right Below Knee Amputation  . Above knee leg amputation Right 04/02/2014    DR DUDA  . Amputation Right 04/02/2014    Procedure: AMPUTATION ABOVE KNEE;  Surgeon: [name], MD;  Location: [place];  Service: Orthopedics;  Laterality: Right;  Right Above Knee Amputation  . Skin biopsy      scalp  . Eye surgery      Allergies  Allergen Reactions  . Morphine Rash and Other (See Comments)    Irritability also   Medications: Patient's Medications  New Prescriptions   No medications on file  Previous Medications   ACCU-CHEK SMARTVIEW TEST STRIP       APIXABAN (ELIQUIS) 5 MG TABS TABLET    Take 1 tablet (5 mg total) by mouth daily.   ASSURE COMFORT LANCETS 30G MISC       BLOOD GLUCOSE CALIBRATION (ACCU-CHEK SMARTVIEW CONTROL) LIQD       DIGOXIN (LANOXIN) 0.125 MG TABLET    Take 1 tablet (0.125 mg total) by mouth daily.   DILTIAZEM (CARDIZEM CD) 180 MG 24 HR CAPSULE    Take 1 capsule (180 mg total) by mouth daily.   FUROSEMIDE (LASIX) 20 MG TABLET    Take 1 tablet (20 mg total) by mouth every other day.   GABAPENTIN (NEURONTIN) 100 MG CAPSULE    Take 1 capsule (100 mg total) by mouth every 12  (twelve) hours.   GLIMEPIRIDE (AMARYL) 2 MG TABLET    Take 1 tablet (2 mg total) by mouth daily with breakfast.   INSULIN LISPRO (HUMALOG KWIKPEN) 100 UNIT/ML KIWKPEN    INJECT 8 UNITS BEFORE MEALS-HOLD FOR BLOOD SUGAR LESS THAN 100   INSULIN PEN NEEDLE (B-D ULTRAFINE III SHORT PEN) 31G X 8 MM MISC    Use two times daily the the administration of insulin E11.9   INSULIN SYRINGE-NEEDLE  U-100 (INSULIN SYRINGE .3CC/31GX5/16") 31G X 5/16" 0.3 ML MISC    0.3 mL 31 x 5/16"   LANTUS SOLOSTAR 100 UNIT/ML SOLOSTAR PEN    INJECT 24 UNITS AT BEDTIME   LISINOPRIL (PRINIVIL,ZESTRIL) 10 MG TABLET    TAKE 1 TABLET EACH DAY.   MELATONIN 5 MG TABS    Take 5 mg by mouth at bedtime as needed (insomnia).   NITROGLYCERIN (NITRODUR - DOSED IN MG/24 HR) 0.2 MG/HR PATCH    Place 1 patch onto the skin daily.   NUTRITIONAL SUPPLEMENTS (FEEDING SUPPLEMENT, GLUCERNA 1.2 CAL,) LIQD    Take 237 mLs by mouth daily as needed.    OXYCODONE HCL 10 MG TABS    Take 1 tablet (10 mg total) by mouth every 8 (eight) hours as needed. For pain   SENNA-DOCUSATE (SENNA-S) 8.6-50 MG PER TABLET    Take 1 tablet by mouth daily as needed.   Modified Medications   No medications on file  Discontinued Medications   CLINDAMYCIN (CLEOCIN) 300 MG CAPSULE    Take 1 capsule by mouth 3 (three) times daily with meals.   DOXYCYCLINE (VIBRA-TABS) 100 MG TABLET    Take 1 tablet (100 mg total) by mouth 2 (two) times daily.    Physical Exam: Filed Vitals:   06/29/15 1605  BP: 136/68  Pulse: 76  Temp: 99 F (37.2 C)  TempSrc: Oral  Resp: 18  Height: 5\' 10"  (1.778 m)  Weight: 171 lb (77.565 kg)  SpO2: 97%   Physical Exam  Constitutional: He is oriented to person, place, and time. He appears well-nourished. No distress.  HENT:  Head: Normocephalic and atraumatic.  Eyes:  glasses  Cardiovascular:  irreg irreg; right AKA  Pulmonary/Chest: Effort normal and breath sounds normal.  Abdominal: Soft. Bowel sounds are normal. He exhibits no  distension. There is no tenderness.  Musculoskeletal: Normal range of motion.  Right AKA  Neurological: He is alert and oriented to person, place, and time.  Skin: Skin is warm and dry.  Left heel ulcer and left great toe ulcer with some necrosis on it  Psychiatric: He has a normal mood and affect.    Labs reviewed: Basic Metabolic Panel:  Recent Labs  12/29/14 1449 01/29/15 1618 06/29/15 1651  NA 137 136 135  K 4.8 4.8 5.0  CL 100 100 99  CO2 21 20 20   GLUCOSE 156* 165* 254*  BUN 31* 26 31*  CREATININE 1.58* 1.63* 1.57*  CALCIUM 9.4 8.9 9.1   Liver Function Tests:  Recent Labs  12/29/14 1449  AST 14  ALT 16  ALKPHOS 119*  BILITOT 0.4  PROT 6.7   No results for input(s): LIPASE, AMYLASE in the last 8760 hours. No results for input(s): AMMONIA in the last 8760 hours. CBC:  Recent Labs  08/04/14 0600 11/24/14 1451 12/29/14 1449 06/29/15 1651  WBC  --  9.8 10.7 9.1  NEUTROABS  --   --  7.3* 6.4  HGB 10.5* 12.8* 13.2  --   HCT 31.2* 36.5* 37.9 37.7  MCV  --  82.0 82  --   PLT  --  221  --   --    Lipid Panel:  Recent Labs  12/29/14 1449  CHOL 149  HDL 32*  LDLCALC 88  TRIG 146  CHOLHDL 4.7   Lab Results  Component Value Date   HGBA1C 7.2* 06/29/2015    Assessment/Plan 1. Type 2 diabetes mellitus with other circulatory complications - control has been fairly good,  but ideally hba1c <7 in his case to promote wound healing -has not had lows -cont his current regimen as above and lisinopril for renal protection -unsure why he's not on a statin?   - Basic metabolic panel - CBC with Differential/Platelet - Hemoglobin A1c - Microalbumin/Creatinine Ratio, Urine; Future  2. Open wound of heel, left, subsequent encounter - following closely on this with Dr. Sharol Given -no s/s of infection - CBC with Differential/Platelet  3. Ulcer of great toe, left, with unspecified severity -following with Dr. Sharol Given -no s/s of infection - CBC with  Differential/Platelet  4. Chronic atrial fibrillation -cont eliquis so check cbc - CBC with Differential/Platelet -cont dig (last level wnl), diltiazem  5. Chronic diastolic CHF (congestive heart failure) -cont lasix, lisinopril, is not on a beta blocker - Basic metabolic panel  6. Phantom pain -intermittent; cont gabapentin, as needed oxycodone (3 per day max)  7. Gastroesophageal reflux disease, esophagitis presence not specified -well controlled with current regimen  8. Need for vaccination with 13-polyvalent pneumococcal conjugate vaccine - prevnar given today - Pneumococcal conjugate vaccine 13-valent IM  Labs/tests ordered: Orders Placed This Encounter  Procedures  . Pneumococcal conjugate vaccine 13-valent IM  . Basic metabolic panel  . CBC with Differential/Platelet  . Hemoglobin A1c  . Microalbumin/Creatinine Ratio, Urine    Standing Status: Future     Number of Occurrences: 1     Standing Expiration Date: 06/28/2016    Next appt:  3 months with Janett Billow (to cancel 8/22 b/c just seen by me)  Yarah Fuente L. Chaim Gatley, D.O. [facility] [address], [place] [address] Cell Phone (Mon-Fri 8am-5pm):  [phone] On Call:  [phone] & follow prompts after 5pm & weekends Office Phone:  [phone] Office Fax:  [phone]

## 2015-06-30 LAB — BASIC METABOLIC PANEL
BUN/Creatinine Ratio: 20 (ref 10–22)
BUN: 31 mg/dL — ABNORMAL HIGH (ref 8–27)
CO2: 20 mmol/L (ref 18–29)
Calcium: 9.1 mg/dL (ref 8.6–10.2)
Chloride: 99 mmol/L (ref 97–108)
Creatinine, Ser: 1.57 mg/dL — ABNORMAL HIGH (ref 0.76–1.27)
GFR calc Af Amer: 48 mL/min/{1.73_m2} — ABNORMAL LOW (ref >59)
GFR calc non Af Amer: 42 mL/min/{1.73_m2} — ABNORMAL LOW (ref >59)
Glucose: 254 mg/dL — ABNORMAL HIGH (ref 65–99)
Potassium: 5 mmol/L (ref 3.5–5.2)
Sodium: 135 mmol/L (ref 134–144)

## 2015-06-30 LAB — HEMOGLOBIN A1C
Est. average glucose Bld gHb Est-mCnc: 160 mg/dL
Hgb A1c MFr Bld: 7.2 % — ABNORMAL HIGH (ref 4.8–5.6)

## 2015-06-30 LAB — CBC WITH DIFFERENTIAL/PLATELET
Basophils Absolute: 0.1 10*3/uL (ref 0.0–0.2)
Basos: 1 %
EOS (ABSOLUTE): 0.2 10*3/uL (ref 0.0–0.4)
Eos: 2 %
Hematocrit: 37.7 % (ref 37.5–51.0)
Hemoglobin: 12.3 g/dL — ABNORMAL LOW (ref 12.6–17.7)
Immature Grans (Abs): 0 10*3/uL (ref 0.0–0.1)
Immature Granulocytes: 0 %
Lymphocytes Absolute: 1.9 10*3/uL (ref 0.7–3.1)
Lymphs: 21 %
MCH: 27 pg (ref 26.6–33.0)
MCHC: 32.6 g/dL (ref 31.5–35.7)
MCV: 83 fL (ref 79–97)
Monocytes Absolute: 0.5 10*3/uL (ref 0.1–0.9)
Monocytes: 6 %
Neutrophils Absolute: 6.4 10*3/uL (ref 1.4–7.0)
Neutrophils: 70 %
Platelets: 241 10*3/uL (ref 150–379)
RBC: 4.55 x10E6/uL (ref 4.14–5.80)
RDW: 14.8 % (ref 12.3–15.4)
WBC: 9.1 10*3/uL (ref 3.4–10.8)

## 2015-07-03 ENCOUNTER — Other Ambulatory Visit: Payer: Medicare Other

## 2015-07-03 DIAGNOSIS — E1159 Type 2 diabetes mellitus with other circulatory complications: Secondary | ICD-10-CM | POA: Diagnosis not present

## 2015-07-03 DIAGNOSIS — E1151 Type 2 diabetes mellitus with diabetic peripheral angiopathy without gangrene: Secondary | ICD-10-CM

## 2015-07-03 NOTE — Addendum Note (Signed)
Addended byMarisa Cyphers C on: 07/03/2015 03:31 PM   Modules accepted: Orders

## 2015-07-04 ENCOUNTER — Other Ambulatory Visit: Payer: Self-pay | Admitting: Internal Medicine

## 2015-07-04 LAB — MICROALBUMIN / CREATININE URINE RATIO
Creatinine, Urine: 53.6 mg/dL
MICROALB/CREAT RATIO: 669.8 mg/g creat — ABNORMAL HIGH (ref 0.0–30.0)
Microalbumin, Urine: 359 ug/mL

## 2015-07-11 ENCOUNTER — Encounter: Payer: Self-pay | Admitting: Internal Medicine

## 2015-07-13 ENCOUNTER — Telehealth: Payer: Self-pay | Admitting: *Deleted

## 2015-07-13 DIAGNOSIS — L97421 Non-pressure chronic ulcer of left heel and midfoot limited to breakdown of skin: Secondary | ICD-10-CM | POA: Diagnosis not present

## 2015-07-13 DIAGNOSIS — M79672 Pain in left foot: Secondary | ICD-10-CM | POA: Diagnosis not present

## 2015-07-13 DIAGNOSIS — I70262 Atherosclerosis of native arteries of extremities with gangrene, left leg: Secondary | ICD-10-CM | POA: Diagnosis not present

## 2015-07-13 DIAGNOSIS — E1142 Type 2 diabetes mellitus with diabetic polyneuropathy: Secondary | ICD-10-CM | POA: Diagnosis not present

## 2015-07-13 NOTE — Telephone Encounter (Signed)
Blood Sugar Reading:  06/30/2015  Am 216  Pm 116 07/01/2015 84 141 07/02/15  96 160 07/03/15 114 100 07/04/15 97 131 07/05/15 109 143 07/06/15 90 135 07/07/15 84 94 07/08/15 105 101 07/09/15 127 97 07/10/15 80 Didn't take 07/11/15 91 151 07/12/15 108 157 07/13/15 111

## 2015-07-13 NOTE — Telephone Encounter (Signed)
Agree with Dr Reed.  

## 2015-07-13 NOTE — Telephone Encounter (Signed)
Patient notified

## 2015-07-13 NOTE — Telephone Encounter (Signed)
I will forward these to [place], but they look good to me.

## 2015-07-16 ENCOUNTER — Other Ambulatory Visit: Payer: Self-pay

## 2015-07-16 DIAGNOSIS — M549 Dorsalgia, unspecified: Principal | ICD-10-CM

## 2015-07-16 DIAGNOSIS — G8929 Other chronic pain: Secondary | ICD-10-CM

## 2015-07-16 MED ORDER — OXYCODONE HCL 10 MG PO TABS: 10.0000 mg | ORAL_TABLET | Freq: Three times a day (TID) | ORAL | Status: DC | PRN

## 2015-07-23 DIAGNOSIS — Z89519 Acquired absence of unspecified leg below knee: Secondary | ICD-10-CM | POA: Diagnosis not present

## 2015-07-24 ENCOUNTER — Other Ambulatory Visit: Payer: Self-pay | Admitting: Nurse Practitioner

## 2015-08-03 ENCOUNTER — Encounter: Payer: Self-pay | Admitting: Podiatry

## 2015-08-03 ENCOUNTER — Ambulatory Visit (INDEPENDENT_AMBULATORY_CARE_PROVIDER_SITE_OTHER): Payer: Medicare Other | Admitting: Podiatry

## 2015-08-03 DIAGNOSIS — E0821 Diabetes mellitus due to underlying condition with diabetic nephropathy: Secondary | ICD-10-CM

## 2015-08-03 DIAGNOSIS — Q828 Other specified congenital malformations of skin: Secondary | ICD-10-CM

## 2015-08-03 DIAGNOSIS — M79676 Pain in unspecified toe(s): Secondary | ICD-10-CM

## 2015-08-03 DIAGNOSIS — M79673 Pain in unspecified foot: Secondary | ICD-10-CM

## 2015-08-03 DIAGNOSIS — B351 Tinea unguium: Secondary | ICD-10-CM | POA: Diagnosis not present

## 2015-08-03 DIAGNOSIS — E1140 Type 2 diabetes mellitus with diabetic neuropathy, unspecified: Secondary | ICD-10-CM

## 2015-08-05 NOTE — Progress Notes (Signed)
Subjective:     Patient ID: [name], male   DOB: 12-07-1934, 80 y.o.   MRN: [id number]  HPI patient presents with long-term diabetes with nail disease 1-5 of both feet that are painful thick and hard to cut along with keratotic lesion sub-fifth metatarsal bilateral that are thick and he cannot take care of himself and are painful   Review of Systems     Objective:   Physical Exam Neurovascular status is diminished but unchanged from previous visit with thick keratotic lesion sub-5 bilateral and nail disease with yellow brittle discoloration 1-5 both feet that are painful when pressed    Assessment:     Keratotic lesion sub-5 bilateral with pain and porokeratotic component along with nail disease 1-5 both feet that are painful    Plan:     Debridement nailbeds 1-5 both feet with no iatrogenic bleeding noted and lesions on both feet with no iatrogenic bleeding noted

## 2015-08-10 DIAGNOSIS — I70262 Atherosclerosis of native arteries of extremities with gangrene, left leg: Secondary | ICD-10-CM | POA: Diagnosis not present

## 2015-08-10 DIAGNOSIS — L97421 Non-pressure chronic ulcer of left heel and midfoot limited to breakdown of skin: Secondary | ICD-10-CM | POA: Diagnosis not present

## 2015-08-10 DIAGNOSIS — E1142 Type 2 diabetes mellitus with diabetic polyneuropathy: Secondary | ICD-10-CM | POA: Diagnosis not present

## 2015-08-18 ENCOUNTER — Ambulatory Visit: Payer: Medicare Other | Admitting: Podiatry

## 2015-08-19 ENCOUNTER — Other Ambulatory Visit: Payer: Self-pay

## 2015-08-19 DIAGNOSIS — G8929 Other chronic pain: Secondary | ICD-10-CM

## 2015-08-19 DIAGNOSIS — M549 Dorsalgia, unspecified: Principal | ICD-10-CM

## 2015-08-19 MED ORDER — OXYCODONE HCL 10 MG PO TABS: 10.0000 mg | ORAL_TABLET | Freq: Three times a day (TID) | ORAL | Status: DC | PRN

## 2015-09-16 ENCOUNTER — Telehealth: Payer: Self-pay

## 2015-09-16 NOTE — Telephone Encounter (Signed)
Does the pt request this? What is the indication? May need an OV if this is a new issue

## 2015-09-16 NOTE — Telephone Encounter (Signed)
[name] from  [facility] called today to have lidocaine prescription refilled for this patient. But this script was D/C 04-02-2014 patient was last seen on 07-24-15 has new appointment on 10-01-15. Please Advise if you would like to reinstate this medication for patient.

## 2015-09-18 NOTE — Telephone Encounter (Signed)
Called  Spoke with patient about the lidocaine cream he said that his insurance company called and offered this to him for his pain he said yes he would like to have the cream for the pain in his hands. Please advise

## 2015-09-21 NOTE — Telephone Encounter (Signed)
Needs OV, not on medication list

## 2015-09-22 ENCOUNTER — Encounter: Payer: Self-pay | Admitting: *Deleted

## 2015-09-22 NOTE — Telephone Encounter (Signed)
Called the patient back spoke with his wife told her that Janett Billow would like him to make an appointment. She said she would have him call the office today to make appointment.

## 2015-09-24 DIAGNOSIS — L821 Other seborrheic keratosis: Secondary | ICD-10-CM | POA: Diagnosis not present

## 2015-09-24 DIAGNOSIS — D044 Carcinoma in situ of skin of scalp and neck: Secondary | ICD-10-CM | POA: Diagnosis not present

## 2015-09-24 DIAGNOSIS — D0421 Carcinoma in situ of skin of right ear and external auricular canal: Secondary | ICD-10-CM | POA: Diagnosis not present

## 2015-09-24 DIAGNOSIS — L570 Actinic keratosis: Secondary | ICD-10-CM | POA: Diagnosis not present

## 2015-09-24 DIAGNOSIS — Z85828 Personal history of other malignant neoplasm of skin: Secondary | ICD-10-CM | POA: Diagnosis not present

## 2015-09-24 DIAGNOSIS — L820 Inflamed seborrheic keratosis: Secondary | ICD-10-CM | POA: Diagnosis not present

## 2015-10-01 ENCOUNTER — Ambulatory Visit: Payer: Self-pay | Admitting: Nurse Practitioner

## 2015-10-05 ENCOUNTER — Ambulatory Visit: Payer: Medicare Other | Admitting: Podiatry

## 2015-10-06 ENCOUNTER — Ambulatory Visit: Payer: Self-pay | Admitting: Nurse Practitioner

## 2015-10-08 ENCOUNTER — Ambulatory Visit (INDEPENDENT_AMBULATORY_CARE_PROVIDER_SITE_OTHER): Payer: Medicare Other | Admitting: Nurse Practitioner

## 2015-10-08 ENCOUNTER — Encounter: Payer: Self-pay | Admitting: Nurse Practitioner

## 2015-10-08 ENCOUNTER — Telehealth: Payer: Self-pay | Admitting: Nurse Practitioner

## 2015-10-08 VITALS — BP 106/60 | HR 83 | Temp 97.3°F | Resp 12

## 2015-10-08 DIAGNOSIS — G8929 Other chronic pain: Secondary | ICD-10-CM | POA: Diagnosis not present

## 2015-10-08 DIAGNOSIS — E11621 Type 2 diabetes mellitus with foot ulcer: Secondary | ICD-10-CM | POA: Diagnosis not present

## 2015-10-08 DIAGNOSIS — M25532 Pain in left wrist: Secondary | ICD-10-CM | POA: Diagnosis not present

## 2015-10-08 DIAGNOSIS — Z23 Encounter for immunization: Secondary | ICD-10-CM | POA: Diagnosis not present

## 2015-10-08 DIAGNOSIS — E785 Hyperlipidemia, unspecified: Secondary | ICD-10-CM

## 2015-10-08 DIAGNOSIS — M549 Dorsalgia, unspecified: Secondary | ICD-10-CM | POA: Diagnosis not present

## 2015-10-08 DIAGNOSIS — K5900 Constipation, unspecified: Secondary | ICD-10-CM

## 2015-10-08 DIAGNOSIS — L97529 Non-pressure chronic ulcer of other part of left foot with unspecified severity: Secondary | ICD-10-CM | POA: Diagnosis not present

## 2015-10-08 DIAGNOSIS — I482 Chronic atrial fibrillation, unspecified: Secondary | ICD-10-CM

## 2015-10-08 MED ORDER — LUBIPROSTONE 24 MCG PO CAPS: 24.0000 ug | ORAL_CAPSULE | Freq: Two times a day (BID) | ORAL | Status: DC

## 2015-10-08 MED ORDER — OXYCODONE HCL 10 MG PO TABS: 10.0000 mg | ORAL_TABLET | Freq: Three times a day (TID) | ORAL | Status: DC | PRN

## 2015-10-08 NOTE — Telephone Encounter (Signed)
PT WANTS TO MAKE SURE WE DO NOT APPROVE ANY MEDICATIONS FROM MANIFEST PHARMACY. YOU DO NOT HAVE TO CALL HIM AT ALL...... Cdavis.

## 2015-10-08 NOTE — Patient Instructions (Signed)
Follow up in 3 months for EV with lab work prior to visit

## 2015-10-08 NOTE — Progress Notes (Signed)
Patient ID: [name], male   DOB: [birth year], 80 y.o.   MRN: [id number]    PCP: Lauree Chandler, NP  Advanced Directive information    Allergies  Allergen Reactions  . Morphine Rash and Other (See Comments)    Irritability also    Chief Complaint  Patient presents with  . Medical Management of Chronic Issues    3 month follow-up, no recent labs   . Immunizations    Flu Vaccine,   . Medication Refill    Oxycodone   . Medication Management    Discuss Lidocaine ointment      HPI: Patient is a 80 y.o. male seen in the office today to follow up on chronic conditions. Feels the best he has in a long time. On a special diet.  Pt with hx of DM, left toe ulcer, chronic a fib, CHF, pain.  Pt received lidocaine cream in the mail- 7 tubes from [facility] in [facility] with Dr Magdalene Molly authorization. Pt has not used this pharmacy in the past and currently does not use. Reports they called him asking if he wanted to try lidocaine ointment and pt agreed. Uses OTC lidocaine ointment does not wish to be taking Rx medication at this time.  conts to follow up Dr Sharol Given, appt next week.  No pain, little drainage. Using lidocaine patch to foot  Following with podiatrist as well.  Blood sugars have been good Needs oxycodone refill- having increased constipation. Taking senna-s without success. Oxycodone for back pain which is effective for pain, does not get pain medication from any other place.    Review of Systems:  Review of Systems  Constitutional: Negative for fever and chills.  HENT: Negative for congestion.   Eyes:       Glasses  Respiratory: Negative for cough and shortness of breath.   Cardiovascular: Negative for chest pain and leg swelling.  Gastrointestinal: Negative for abdominal pain.  Genitourinary: Negative for dysuria.  Musculoskeletal: Positive for arthralgias (back pain). Negative for myalgias.  Skin: Negative for rash.        ulcers left toes followed by duda   Neurological: Negative for weakness.       Phantom pain in right leg  Psychiatric/Behavioral: The patient is not nervous/anxious.     Past Medical History  Diagnosis Date  . Fatigue   . Overweight(278.02)   . Syncope and collapse   . GERD (gastroesophageal reflux disease)   . Right bundle branch block   . Unspecified venous (peripheral) insufficiency   . Phlebitis and thrombophlebitis of superficial vessels of lower extremities   . Cellulitis and abscess of leg, except foot   . Backache, unspecified   . Long term (current) use of anticoagulants   . Type II or unspecified type diabetes mellitus without mention of complication, not stated as uncontrolled   . Unspecified venous (peripheral) insufficiency   . Hypertension   . Gout, unspecified   . Heart failure   . Atrial fibrillation ([place])     permanent  . Esophageal stricture   . Hx of colonic polyps 05-2006    (Adenomatous)Dr. [name]  . Diverticulosis 05-2006    Dr. [name]   . Tachycardia-bradycardia [facility])     s/p PPM  . Pacemaker- [name] 02/20/2013  . CHF (congestive heart failure) ([place])   . Skin cancer     squamous cell carcinoma scalp  . Hx of radiation therapy 06/16/14- 07/18/14    mult squamous cell carcinomas of scalp  Past Surgical History  Procedure Laterality Date  . Laminectomy    . Lumbar fusion    . Rotator cuff repair    . Ruptured rt rectus muscle    . Pacemaker insertion  8/12    SJM by [name] for tachy/brady syndrome  . I&d extremity Right 01/17/2014    Procedure: IRRIGATION AND DEBRIDEMENT RIGHT HEEL  WITH CULTURES AND BONE BIOPSY, placement of wound vac;  Surgeon: Theodoro Kos, DO;  Location: [place];  Service: Plastics;  Laterality: Right;  . Amputation Right 01/20/2014    Procedure: AMPUTATION BELOW KNEE- right;  Surgeon: [name], MD;  Location: [place];  Service: Orthopedics;  Laterality: Right;  Right Below Knee Amputation  . Amputation Right 02/14/2014    Procedure: AMPUTATION BELOW KNEE;   Surgeon: [name], MD;  Location: [place];  Service: Orthopedics;  Laterality: Right;  Right Below Knee Amputation Revision  . Amputation Right 03/07/2014    Procedure: AMPUTATION BELOW KNEE;  Surgeon: [name], MD;  Location: [place];  Service: Orthopedics;  Laterality: Right;  Right Below Knee Amputation Revision  . Amputation Right 03/18/2014    Procedure: revision of  AMPUTATION BELOW KNEE;  Surgeon: [name], MD;  Location: [place];  Service: Orthopedics;  Laterality: Right;  Revision Right Below Knee Amputation  . Above knee leg amputation Right 04/02/2014    DR DUDA  . Amputation Right 04/02/2014    Procedure: AMPUTATION ABOVE KNEE;  Surgeon: [name], MD;  Location: [place];  Service: Orthopedics;  Laterality: Right;  Right Above Knee Amputation  . Skin biopsy      scalp  . Eye surgery     Social History:   reports that he has quit smoking. He has never used smokeless tobacco. He reports that he does not drink alcohol or use illicit drugs.  Family History  Problem Relation Age of Onset  . Pneumonia Mother   . Heart disease Father   . Diabetes Father   . Colon cancer Neg Hx     Medications: Patient's Medications  New Prescriptions   No medications on file  Previous Medications   ACCU-CHEK SMARTVIEW TEST STRIP       APIXABAN (ELIQUIS) 5 MG TABS TABLET    Take 1 tablet (5 mg total) by mouth daily.   ASSURE COMFORT LANCETS 30G MISC       BLOOD GLUCOSE CALIBRATION (ACCU-CHEK SMARTVIEW CONTROL) LIQD       DIGOX 125 MCG TABLET    TAKE 1 TABLET ONCE DAILY.   DILTIAZEM (CARDIZEM CD) 180 MG 24 HR CAPSULE    Take 1 capsule (180 mg total) by mouth daily.   FUROSEMIDE (LASIX) 20 MG TABLET    Take 1 tablet (20 mg total) by mouth every other day.   GABAPENTIN (NEURONTIN) 100 MG CAPSULE    Take 1 capsule (100 mg total) by mouth every 12 (twelve) hours.   GLIMEPIRIDE (AMARYL) 2 MG TABLET    Take 1 tablet (2 mg total) by mouth daily with breakfast.   INSULIN LISPRO (HUMALOG  KWIKPEN) 100 UNIT/ML KIWKPEN    INJECT 8 UNITS BEFORE MEALS-HOLD FOR BLOOD SUGAR LESS THAN 100   INSULIN SYRINGE-NEEDLE U-100 (INSULIN SYRINGE .3CC/31GX5/16") 31G X 5/16" 0.3 ML MISC    0.3 mL 31 x 5/16"   LANTUS SOLOSTAR 100 UNIT/ML SOLOSTAR PEN    INJECT 24 UNITS AT BEDTIME   LISINOPRIL (PRINIVIL,ZESTRIL) 10 MG TABLET    TAKE 1 TABLET  EACH DAY.   MELATONIN 5 MG TABS    Take 5 mg by mouth at bedtime as needed (insomnia).   NITROGLYCERIN (NITRODUR - DOSED IN MG/24 HR) 0.2 MG/HR PATCH    Place 1 patch onto the skin daily.   NUTRITIONAL SUPPLEMENTS (FEEDING SUPPLEMENT, GLUCERNA 1.2 CAL,) LIQD    Take 237 mLs by mouth daily as needed.    OXYCODONE HCL 10 MG TABS    Take 1 tablet (10 mg total) by mouth every 8 (eight) hours as needed. For pain   SENNA-DOCUSATE (SENNA-S) 8.6-50 MG PER TABLET    Take 1 tablet by mouth daily as needed.   Modified Medications   Modified Medication Previous Medication   INSULIN PEN NEEDLE (B-D ULTRAFINE III SHORT PEN) 31G X 8 MM MISC Insulin Pen Needle (B-D ULTRAFINE III SHORT PEN) 31G X 8 MM MISC      Use four times daily the the administration of insulin E11.9    Use two times daily the the administration of insulin E11.9  Discontinued Medications   OXYCODONE HCL 10 MG TABS    Take 1 tablet (10 mg total) by mouth every 8 (eight) hours as needed. For pain     Physical Exam:  Filed Vitals:   10/08/15 1502  BP: 106/60  Pulse: 83  Temp: 97.3 F (36.3 C)  TempSrc: Oral  Resp: 12  SpO2: 95%   There is no weight on file to calculate BMI.  Physical Exam  Constitutional: He is oriented to person, place, and time. He appears well-nourished. No distress.  HENT:  Head: Normocephalic and atraumatic.  Eyes:  glasses  Cardiovascular:  irreg irreg; right AKA  Pulmonary/Chest: Effort normal and breath sounds normal.  Abdominal: Soft. Bowel sounds are normal. He exhibits no distension. There is no tenderness.  Musculoskeletal: Normal range of motion.  Right AKA    Neurological: He is alert and oriented to person, place, and time.  Skin: Skin is warm and dry.   left great toe ulcer open with necrosis and min serous drainage  Psychiatric: He has a normal mood and affect.    Labs reviewed: Basic Metabolic Panel:  Recent Labs  12/29/14 1449 01/29/15 1618 06/29/15 1651  NA 137 136 135  K 4.8 4.8 5.0  CL 100 100 99  CO2 21 20 20   GLUCOSE 156* 165* 254*  BUN 31* 26 31*  CREATININE 1.58* 1.63* 1.57*  CALCIUM 9.4 8.9 9.1   Liver Function Tests:  Recent Labs  12/29/14 1449  AST 14  ALT 16  ALKPHOS 119*  BILITOT 0.4  PROT 6.7  ALBUMIN 3.9   No results for input(s): LIPASE, AMYLASE in the last 8760 hours. No results for input(s): AMMONIA in the last 8760 hours. CBC:  Recent Labs  11/24/14 1451 12/29/14 1449 06/29/15 1651  WBC 9.8 10.7 9.1  NEUTROABS  --  7.3* 6.4  HGB 12.8* 13.2  --   HCT 36.5* 37.9 37.7  MCV 82.0 82  --   PLT 221  --   --    Lipid Panel:  Recent Labs  12/29/14 1449  CHOL 149  HDL 32*  LDLCALC 88  TRIG 146  CHOLHDL 4.7   TSH: No results for input(s): TSH in the last 8760 hours. A1C: Lab Results  Component Value Date   HGBA1C 7.2* 06/29/2015     Assessment/Plan 1. Chronic atrial fibrillation (HCC) -rate controlled, currently on dig 125 mcg daily  - CBC with Differential; Future - Digoxin level; Future  2. Constipation, unspecified constipation type -worse due to pain medication, has tried several OTC without good results - lubiprostone (AMITIZA) 24 MCG capsule; Take 1 capsule (24 mcg total) by mouth 2 (two) times daily with a meal.  Dispense: 60 capsule; Refill: 1  3. Diabetic ulcer of left foot associated with type 2 diabetes mellitus ([place]) -remains stable, ongoing wound care provided by Dr Sharol Given and podiatry, no signs of infection   Cont current diabetic medication.  - Comprehensive metabolic panel; Future - Hemoglobin A1c; Future  4. Hyperlipidemia -cont lifestyle modifications,  not currently on medications - Lipid panel; Future  5. Chronic back pain -stable on oxycodone  - Oxycodone HCl 10 MG TABS; Take 1 tablet (10 mg total) by mouth every 8 (eight) hours as needed. For pain  Dispense: 60 tablet; Refill: 0  6. Wrist pain -managed with OTC lidocaine, does not wish to receive any more topical prescription medication   Follow up in 3 months for EV with fasting blood work prior to visit   Glendale Youngblood K. Harle Battiest  [facility] & Adult Medicine [phone] 8 am - 5 pm) [phone] (after hours)

## 2015-10-12 DIAGNOSIS — M79672 Pain in left foot: Secondary | ICD-10-CM | POA: Diagnosis not present

## 2015-10-12 DIAGNOSIS — Z89611 Acquired absence of right leg above knee: Secondary | ICD-10-CM | POA: Diagnosis not present

## 2015-10-12 DIAGNOSIS — L97421 Non-pressure chronic ulcer of left heel and midfoot limited to breakdown of skin: Secondary | ICD-10-CM | POA: Diagnosis not present

## 2015-10-12 DIAGNOSIS — I70262 Atherosclerosis of native arteries of extremities with gangrene, left leg: Secondary | ICD-10-CM | POA: Diagnosis not present

## 2015-10-19 ENCOUNTER — Other Ambulatory Visit: Payer: Self-pay | Admitting: Nurse Practitioner

## 2015-10-19 ENCOUNTER — Ambulatory Visit (INDEPENDENT_AMBULATORY_CARE_PROVIDER_SITE_OTHER): Payer: Medicare Other | Admitting: *Deleted

## 2015-10-19 DIAGNOSIS — I4821 Permanent atrial fibrillation: Secondary | ICD-10-CM

## 2015-10-19 DIAGNOSIS — Z950 Presence of cardiac pacemaker: Secondary | ICD-10-CM

## 2015-10-19 DIAGNOSIS — I482 Chronic atrial fibrillation: Secondary | ICD-10-CM | POA: Diagnosis not present

## 2015-10-19 LAB — CUP PACEART INCLINIC DEVICE CHECK
Implantable Lead Location: 753860
Lead Channel Impedance Value: 562.5 Ohm
Lead Channel Pacing Threshold Amplitude: 0.5 V
Lead Channel Pacing Threshold Pulse Width: 0.4 ms
Lead Channel Setting Sensing Sensitivity: 2 mV
MDC IDC LEAD IMPLANT DT: 20120817
MDC IDC LEAD MODEL: 1948
MDC IDC MSMT BATTERY REMAINING LONGEVITY: 136.8
MDC IDC MSMT BATTERY VOLTAGE: 2.96 V
MDC IDC MSMT LEADCHNL RV PACING THRESHOLD AMPLITUDE: 0.5 V
MDC IDC MSMT LEADCHNL RV PACING THRESHOLD PULSEWIDTH: 0.4 ms
MDC IDC MSMT LEADCHNL RV SENSING INTR AMPL: 11.8 mV
MDC IDC PG SERIAL: 7244744
MDC IDC SESS DTM: 20161128160350
MDC IDC SET LEADCHNL RV PACING AMPLITUDE: 2 V
MDC IDC SET LEADCHNL RV PACING PULSEWIDTH: 0.4 ms
MDC IDC STAT BRADY RV PERCENT PACED: 21 %
Pulse Gen Model: 1210

## 2015-10-19 NOTE — Progress Notes (Signed)
Pacemaker check in clinic. Normal device function. Threshold, sensing, impedances consistent with previous measurements. Device programmed to maximize longevity. No high ventricular rates noted. Device programmed at appropriate safety margins. Histogram distribution appropriate for patient activity level. Device programmed to optimize intrinsic conduction. Estimated longevity 11.17yrs. ROV w/ SK in 58mo.

## 2015-11-02 ENCOUNTER — Ambulatory Visit: Payer: Medicare Other | Admitting: Podiatry

## 2015-11-03 ENCOUNTER — Other Ambulatory Visit: Payer: Self-pay | Admitting: *Deleted

## 2015-11-03 DIAGNOSIS — M549 Dorsalgia, unspecified: Principal | ICD-10-CM

## 2015-11-03 DIAGNOSIS — G8929 Other chronic pain: Secondary | ICD-10-CM

## 2015-11-03 MED ORDER — OXYCODONE HCL 10 MG PO TABS: 10.0000 mg | ORAL_TABLET | Freq: Three times a day (TID) | ORAL | Status: DC | PRN

## 2015-11-03 NOTE — Telephone Encounter (Signed)
Patient called and requested. Narcotic Psychologist, occupational.

## 2015-11-05 ENCOUNTER — Telehealth: Payer: Self-pay | Admitting: *Deleted

## 2015-11-05 NOTE — Telephone Encounter (Signed)
Patient called and left message requesting to speak with Janett Billow or Dr. Mariea Clonts regarding his Insulin. Called patient back and he stated that he was told to take a reading before breakfast and before supper. Sometimes his reading is as low as 65 and then sometimes as high as 120. He thinks his schedule on his insulin needs to be adjusting. Currently taking 8 units with meals when his blood sugar is above 100.   He stated I was not qualify to talk to him regarding his health and I didn't know him and he only wants to speak with Dr. Mariea Clonts or Janett Billow.  Wants one of the two of you to return his call [phone]

## 2015-11-05 NOTE — Telephone Encounter (Signed)
Can decrease lantus to 22 units at bedtime, cont to monitor fasting blood sugars as well as blood sugars prior to meals as he has been doing. Please make appt with myself or Dr Mariea Clonts for 1-2 weeks to review blood glucose

## 2015-11-06 NOTE — Telephone Encounter (Signed)
Patient notified and agreed. Will call back to schedule an appointment. Wife is going to drop off Narcotic Contract today.

## 2015-11-12 ENCOUNTER — Other Ambulatory Visit: Payer: Self-pay | Admitting: Nurse Practitioner

## 2015-11-18 ENCOUNTER — Encounter: Payer: Self-pay | Admitting: Internal Medicine

## 2015-11-18 ENCOUNTER — Ambulatory Visit (INDEPENDENT_AMBULATORY_CARE_PROVIDER_SITE_OTHER): Payer: Medicare Other | Admitting: Internal Medicine

## 2015-11-18 VITALS — BP 118/50 | HR 74 | Temp 98.0°F | Resp 20

## 2015-11-18 DIAGNOSIS — L97529 Non-pressure chronic ulcer of other part of left foot with unspecified severity: Secondary | ICD-10-CM

## 2015-11-18 DIAGNOSIS — J069 Acute upper respiratory infection, unspecified: Secondary | ICD-10-CM | POA: Diagnosis not present

## 2015-11-18 DIAGNOSIS — E11621 Type 2 diabetes mellitus with foot ulcer: Secondary | ICD-10-CM | POA: Diagnosis not present

## 2015-11-18 DIAGNOSIS — K5900 Constipation, unspecified: Secondary | ICD-10-CM | POA: Diagnosis not present

## 2015-11-18 MED ORDER — GUAIFENESIN ER 600 MG PO TB12: 1200.0000 mg | ORAL_TABLET | Freq: Two times a day (BID) | ORAL | Status: DC

## 2015-11-18 MED ORDER — NALOXEGOL OXALATE 12.5 MG PO TABS: 12.5000 mg | ORAL_TABLET | Freq: Every day | ORAL | Status: DC

## 2015-11-18 MED ORDER — DIGOXIN 125 MCG PO TABS: 125.0000 ug | ORAL_TABLET | Freq: Every day | ORAL | Status: DC

## 2015-11-18 NOTE — Progress Notes (Signed)
Patient ID: [name], male   DOB: [birth year], 80 y.o.   MRN: [id number]    Location:    [facility]   Place of Service:   OFFICE  Chief Complaint  Patient presents with  . Acute Visit    Patient c/o Cold and constipation and check foot    HPI:  80 yo male seen today for URI sx's and constipation. He c/o of day hx nasal congestion and pdtive cough with cloudy sputum. He has low grade temp with Tmax 100F. He reports sick contact with similar sx's. No f/c, HA, dizziness. No CP or SOB but has sinus pressure. No ear sx's.   GERD/diverticulosis/constipation - takes amitiza but constipation relief limited by chronic opioid use. He reports that med does not work well. He has tried warm water with prune juice + MOM which works well but he is out of prune juice.   Chronic back pain - on oxycodone  DM - on amaryl and insulin. BS checked BID and range 90-120s fasting and daytime 80-190s during the day. They have not become elevated since acute illness  He is c/a left heel ulcer that he picked scab off yesterday and it has been bleeding. No d/c. Hx right AKA by Dr Sharol Given that started as ulcer also.   Past Medical History  Diagnosis Date  . Fatigue   . Overweight(278.02)   . Syncope and collapse   . GERD (gastroesophageal reflux disease)   . Right bundle branch block   . Unspecified venous (peripheral) insufficiency   . Phlebitis and thrombophlebitis of superficial vessels of lower extremities   . Cellulitis and abscess of leg, except foot   . Backache, unspecified   . Long term (current) use of anticoagulants   . Type II or unspecified type diabetes mellitus without mention of complication, not stated as uncontrolled   . Unspecified venous (peripheral) insufficiency   . Hypertension   . Gout, unspecified   . Heart failure   . Atrial fibrillation ([place])     permanent  . Esophageal stricture   . Hx of colonic polyps 05-2006    (Adenomatous)Dr. [name]  . Diverticulosis 05-2006    Dr.  [name]   . 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 Pacemaker- [name] 02/20/2013  . CHF (congestive heart failure) ([place])   . Skin cancer     squamous cell carcinoma scalp  . Hx of radiation therapy 06/16/14- 07/18/14    mult squamous cell carcinomas of scalp    Past Surgical History  Procedure Laterality Date  . Laminectomy    . Lumbar fusion    . Rotator cuff repair    . Ruptured rt rectus muscle    . Pacemaker insertion  8/12    SJM by [name] for tachy/brady syndrome  . I&d extremity Right 01/17/2014    Procedure: IRRIGATION AND DEBRIDEMENT RIGHT HEEL  WITH CULTURES AND BONE BIOPSY, placement of wound vac;  Surgeon: Theodoro Kos, DO;  Location: [place];  Service: Plastics;  Laterality: Right;  . Amputation Right 01/20/2014    Procedure: AMPUTATION BELOW KNEE- right;  Surgeon: [name], MD;  Location: [place];  Service: Orthopedics;  Laterality: Right;  Right Below Knee Amputation  . Amputation Right 02/14/2014    Procedure: AMPUTATION BELOW KNEE;  Surgeon: [name], MD;  Location: [place];  Service: Orthopedics;  Laterality: Right;  Right Below Knee Amputation Revision  . Amputation Right 03/07/2014    Procedure: AMPUTATION  BELOW KNEE;  Surgeon: [name], MD;  Location: [place];  Service: Orthopedics;  Laterality: Right;  Right Below Knee Amputation Revision  . Amputation Right 03/18/2014    Procedure: revision of  AMPUTATION BELOW KNEE;  Surgeon: [name], MD;  Location: [place];  Service: Orthopedics;  Laterality: Right;  Revision Right Below Knee Amputation  . Above knee leg amputation Right 04/02/2014    DR DUDA  . Amputation Right 04/02/2014    Procedure: AMPUTATION ABOVE KNEE;  Surgeon: [name], MD;  Location: [place];  Service: Orthopedics;  Laterality: Right;  Right Above Knee Amputation  . Skin biopsy      scalp  . Eye surgery      Patient Care Team: Lauree Chandler, NP as PCP - General (Nurse Practitioner) Ricard Dillon, MD as Consulting Physician  (Internal Medicine) Deboraha Sprang, MD as Consulting Physician (Cardiology) Thompson Grayer, MD as Consulting Physician (Cardiology) [name], MD as Consulting Physician (Orthopedic Surgery)  Social History   Social History  . Marital Status: Married    Spouse Name: [name]  . Number of Children: 2  . Years of Education: [name]   Occupational History  . Retired Hotel manager    Social History Main Topics  . Smoking status: Former Research scientist (life sciences)  . Smokeless tobacco: Never Used     Comment: Quit at age 69   . Alcohol Use: No     Comment: none currenlty  . Drug Use: No  . Sexual Activity: Not Currently   Other Topics Concern  . Not on file   Social History Narrative   2 caffeine drinks daily    Stopped smoking 1963   Married   Exercise bar bells (R) leg amputation   Alcohol none         reports that he has quit smoking. He has never used smokeless tobacco. He reports that he does not drink alcohol or use illicit drugs.  Allergies  Allergen Reactions  . Morphine Rash and Other (See Comments)    Irritability also    Medications: Patient's Medications  New Prescriptions   No medications on file  Previous Medications   ACCU-CHEK SMARTVIEW TEST STRIP       APIXABAN (ELIQUIS) 5 MG TABS TABLET    Take 1 tablet (5 mg total) by mouth daily.   ASSURE COMFORT LANCETS 30G MISC       BLOOD GLUCOSE CALIBRATION (ACCU-CHEK SMARTVIEW CONTROL) LIQD       DIGOX 125 MCG TABLET    TAKE 1 TABLET ONCE DAILY.   DILTIAZEM (CARDIZEM CD) 180 MG 24 HR CAPSULE    Take 1 capsule (180 mg total) by mouth daily.   FUROSEMIDE (LASIX) 20 MG TABLET    TAKE ONE TABLET EVERY OTHER DAY.   GABAPENTIN (NEURONTIN) 100 MG CAPSULE    Take 1 capsule (100 mg total) by mouth every 12 (twelve) hours.   GLIMEPIRIDE (AMARYL) 2 MG TABLET    Take 1 tablet (2 mg total) by mouth daily with breakfast.   HUMALOG KWIKPEN 100 UNIT/ML KIWKPEN    INJECT 8 UNITS BEFORE MEALS-HOLD FOR BLOOD SUGAR LESS THAN 100   INSULIN PEN NEEDLE (B-D  ULTRAFINE III SHORT PEN) 31G X 8 MM MISC    Use four times daily the the administration of insulin E11.9   INSULIN SYRINGE-NEEDLE U-100 (INSULIN SYRINGE .3CC/31GX5/16") 31G X 5/16" 0.3 ML MISC    0.3 mL 31 x 5/16"   LANTUS SOLOSTAR 100 UNIT/ML  SOLOSTAR PEN    INJECT 24 UNITS AT BEDTIME   LISINOPRIL (PRINIVIL,ZESTRIL) 10 MG TABLET    TAKE 1 TABLET EACH DAY.   LUBIPROSTONE (AMITIZA) 24 MCG CAPSULE    Take 1 capsule (24 mcg total) by mouth 2 (two) times daily with a meal.   MELATONIN 5 MG TABS    Take 5 mg by mouth at bedtime as needed (insomnia).   NITROGLYCERIN (NITRODUR - DOSED IN MG/24 HR) 0.2 MG/HR PATCH    Place 1 patch onto the skin daily.   NUTRITIONAL SUPPLEMENTS (FEEDING SUPPLEMENT, GLUCERNA 1.2 CAL,) LIQD    Take 237 mLs by mouth daily as needed.    OXYCODONE HCL 10 MG TABS    Take 1 tablet (10 mg total) by mouth every 8 (eight) hours as needed. For pain   SENNA-DOCUSATE (SENNA-S) 8.6-50 MG PER TABLET    Take 1 tablet by mouth daily as needed.   Modified Medications   No medications on file  Discontinued Medications   No medications on file    Review of Systems  Constitutional: Positive for fatigue. Negative for chills and activity change.  HENT: Negative for sore throat and trouble swallowing.   Eyes: Negative for visual disturbance.  Respiratory: Negative for cough, chest tightness and shortness of breath.   Cardiovascular: Negative for chest pain, palpitations and leg swelling.  Gastrointestinal: Negative for nausea, vomiting, abdominal pain and blood in stool.  Genitourinary: Negative for urgency, frequency and difficulty urinating.  Musculoskeletal: Positive for gait problem. Negative for arthralgias.  Skin: Positive for rash and wound.  Neurological: Negative for weakness and headaches.  Psychiatric/Behavioral: Negative for confusion and sleep disturbance. The patient is not nervous/anxious.     Filed Vitals:   11/18/15 1434  BP: 118/50  Pulse: 74  Temp: 98 F (36.7  C)  TempSrc: Oral  Resp: 20  SpO2: 98%   There is no weight on file to calculate BMI.  Physical Exam  Constitutional: He is oriented to person, place, and time. He appears well-developed and well-nourished.  HENT:  TM intact b/l, nonbulging no redness. No sinus TTP. oropharynx clear without exudate. No redness  Eyes: Pupils are equal, round, and reactive to light. No scleral icterus.  Neck: Neck supple.  Cardiovascular: An irregularly irregular rhythm present. Exam reveals no gallop, no distant heart sounds and no friction rub.   Murmur heard.  Systolic murmur is present with a grade of 1/6  Right AKA. Reduced DP/PT pulse on left  Pulmonary/Chest: Effort normal and breath sounds normal. No respiratory distress. He has no wheezes. He has no rales.  Abdominal: Soft. Bowel sounds are normal. He exhibits no distension. There is no tenderness. There is no rebound and no guarding.  Musculoskeletal: He exhibits edema.  Right AKA  Lymphadenopathy:    He has no cervical adenopathy.  Neurological: He is alert and oriented to person, place, and time.  Skin: Skin is warm and dry.  Lateral left heel with scaley area, dry, NT and no d/c about 25 cent sized. No redness. Left tip of toes calluses with no d/c or redness  Psychiatric: He has a normal mood and affect. His behavior is normal. Thought content normal.     Labs reviewed: Clinical Support on 10/19/2015  Component Date Value Ref Range Status  . Date Time Interrogation Session 10/19/2015 [id number]   Final  . Pulse Generator Manufacturer 10/19/2015 SJCR   Final  . Pulse Gen Model 10/19/2015 1210 [employer] SR RF   Final  .  Pulse Gen Serial Number 10/19/2015 [id number]   Final  . Implantable Pulse Generator Type 10/19/2015 Implantable Pulse Generator   Final  . Implantable Pulse Generator Implan* 10/19/2015 [id number]   Final  . Implantable Lead Manufacturer 10/19/2015 SJCR   Final  . Implantable Lead Model 10/19/2015 1948    Final  . Implantable Lead Serial Number 10/19/2015 [id number]   Final  . Implantable Lead Implant Date 10/19/2015 [id number]   Final  . Implantable Lead Location 10/19/2015 [id number]   Final  . Lead Channel Setting Sensing Sensi* 10/19/2015 2.0   Final  . Lead Channel Setting Pacing Pulse * 10/19/2015 0.4   Final  . Lead Channel Setting Pacing Amplit* 10/19/2015 2.0   Final  . Lead Channel Impedance Value 10/19/2015 562.5   Final  . Lead Channel Sensing Intrinsic Amp* 10/19/2015 11.8   Final  . Lead Channel Pacing Threshold Ampl* 10/19/2015 0.5   Final  . Lead Channel Pacing Threshold Puls* 10/19/2015 0.4   Final  . Lead Channel Pacing Threshold Ampl* 10/19/2015 0.5   Final  . Lead Channel Pacing Threshold Puls* 10/19/2015 0.4   Final  . Battery Status 10/19/2015 Unknown   Final  . Battery Remaining Longevity 10/19/2015 136.8   Final  . Battery Voltage 10/19/2015 2.96   Final  . Loletha Grayer Statistic RV Percent Paced 10/19/2015 21.0   Final  . Eval Rhythm 10/19/2015 Irreg Vs@50 -86   Final    No results found.   Assessment/Plan   ICD-9-CM ICD-10-CM   1. Viral upper respiratory illness 465.9 J06.9   2. Constipation, unspecified constipation type - failing to change as expected 564.00 K59.00   3. Diabetic ulcer of left foot associated with type 2 diabetes mellitus ([place]) 250.80 E11.621    707.15 L97.529    Push fluids and rest. May take OTC plain antihistamine daily  STOP AMITIZA. Start movantik for constipation due to chronic opioid use  Follow up with podiatry as scheduled. Please mention left heel at appt  Continue current medications as ordered  Follow up as scheduled with Dr Mariea Clonts as scheduled  [facility]. Perlie Gold  [facility] and Adult Medicine [address] [place], [place] [address] [phone] Cell (Monday-Friday 8 AM - 5 PM) [phone] After 5 PM and follow prompts

## 2015-11-18 NOTE — Patient Instructions (Addendum)
Push fluids and rest. May take OTC plain antihistamine daily  STOP AMITIZA  Follow up with podiatry as scheduled. Please mention left heel at appt  Continue current medications as ordered  Follow up as scheduled with Dr Mariea Clonts as scheduled

## 2015-12-04 ENCOUNTER — Telehealth: Payer: Self-pay

## 2015-12-04 NOTE — Telephone Encounter (Signed)
Prior Authorization was received for Movantix 12..5 MG tablet, take 1 tablet daily. I initiated prior authorization by calling [phone]. Patient ID Number: [id number].  Completed PA form, faxed form to [phone]. Awaiting response from insurance company.

## 2015-12-07 ENCOUNTER — Encounter: Payer: Self-pay | Admitting: Podiatry

## 2015-12-07 ENCOUNTER — Encounter: Payer: Self-pay | Admitting: Internal Medicine

## 2015-12-07 ENCOUNTER — Ambulatory Visit (INDEPENDENT_AMBULATORY_CARE_PROVIDER_SITE_OTHER): Payer: Medicare Other | Admitting: Podiatry

## 2015-12-07 DIAGNOSIS — M79676 Pain in unspecified toe(s): Secondary | ICD-10-CM

## 2015-12-07 DIAGNOSIS — L97411 Non-pressure chronic ulcer of right heel and midfoot limited to breakdown of skin: Secondary | ICD-10-CM

## 2015-12-07 DIAGNOSIS — B351 Tinea unguium: Secondary | ICD-10-CM

## 2015-12-07 DIAGNOSIS — M79673 Pain in unspecified foot: Secondary | ICD-10-CM

## 2015-12-07 DIAGNOSIS — E0821 Diabetes mellitus due to underlying condition with diabetic nephropathy: Secondary | ICD-10-CM

## 2015-12-07 DIAGNOSIS — E1140 Type 2 diabetes mellitus with diabetic neuropathy, unspecified: Secondary | ICD-10-CM

## 2015-12-07 NOTE — Telephone Encounter (Signed)
Received fax from [facility] Fax: [phone] [id number] for [place] has been DENIED. Denied because it is not a covered drug and patient has to have tried and failed one of the following: Constulose, Enulose, Generlac, or Lactulose before approving Movantik. Given determination to [place].

## 2015-12-08 ENCOUNTER — Telehealth: Payer: Self-pay | Admitting: *Deleted

## 2015-12-08 ENCOUNTER — Other Ambulatory Visit: Payer: Self-pay | Admitting: *Deleted

## 2015-12-08 NOTE — Telephone Encounter (Signed)
1) Okay to talk about medication for constipation 2) We can not prescribe medication differently than taking. If needed can make appt with [facility] for further evaluation of diabetic medication

## 2015-12-08 NOTE — Telephone Encounter (Signed)
1.  Received fax from [place] and coverage for Movantik has been DENIED. Member ID#: [id number] Billow stated that patient may try Amitiza or Linzess and we could give him samples first. Called patient back he stated that he gets constipated from his pain medication and would like a refill on this. Already taking Amitiza and it does not work, patient stated that he will talk with his provider at his next appointment and for now he will continue his prune juice and MOM.   2.  Patient stated he is on Lantus and Humalog and he purchases by 5 pens at a time. Was on Patient assistance last year. Patient is no longer on it because he no longer has Medicaid. Needs to reduce his price of medication. [employer] tells him to get his medication through [employer] to get cheaper. Cost $45/month. Needs Lantus and Humalog increased to slightly larger dosage to get cheaper.   Wants Lantus 25 units a night and Humalog 9 units with meals three times daily. If prescribed this way he can get it cheaper through [facility].  He stated that he would not take it this way but needs it prescribed this way.  Please Advise.

## 2015-12-09 NOTE — Telephone Encounter (Signed)
LMOM to return call.

## 2015-12-09 NOTE — Progress Notes (Signed)
Subjective:     Patient ID: [name], male   DOB: September 20, 1935, 81 y.o.   MRN: [id number]  HPI patient presents stating I'm getting some breakdown of tissue on my big toe second toe left it's been look at by the vascular doctors but it's stable but my nails of the long gaited on my left foot   Review of Systems     Objective:   Physical Exam Significant circulatory disease left with history of right leg amputation with crusted tissue left big toe second toe that stable with no active drainage redness or other irritation. Elongated nails 1-5 left that he cannot cut and are high risk due to vascular disease    Assessment:     Stress on the left big toe second toe which may ultimately require amputation with nail disease 1-5 left    Plan:     Continue monitoring of condition left and we did dispensed surgical shoe to keep pressure off this digits and debrided nailbeds 1-5 left with strict instructions to come in immediately if any changes should occur and if not will be seen for routine care

## 2015-12-10 NOTE — Telephone Encounter (Signed)
LMOM to return call.

## 2015-12-11 ENCOUNTER — Other Ambulatory Visit: Payer: Self-pay

## 2015-12-11 DIAGNOSIS — G8929 Other chronic pain: Secondary | ICD-10-CM

## 2015-12-11 DIAGNOSIS — M549 Dorsalgia, unspecified: Principal | ICD-10-CM

## 2015-12-11 MED ORDER — OXYCODONE HCL 10 MG PO TABS: 10.0000 mg | ORAL_TABLET | Freq: Three times a day (TID) | ORAL | Status: DC | PRN

## 2015-12-18 ENCOUNTER — Other Ambulatory Visit: Payer: Self-pay | Admitting: Nurse Practitioner

## 2015-12-30 NOTE — Telephone Encounter (Signed)
Patient has an appointment on 2/23 for CPE and will discuss with Janett Billow

## 2016-01-11 ENCOUNTER — Other Ambulatory Visit: Payer: Medicare Other

## 2016-01-11 DIAGNOSIS — E11621 Type 2 diabetes mellitus with foot ulcer: Secondary | ICD-10-CM

## 2016-01-11 DIAGNOSIS — L97529 Non-pressure chronic ulcer of other part of left foot with unspecified severity: Secondary | ICD-10-CM | POA: Diagnosis not present

## 2016-01-11 DIAGNOSIS — E785 Hyperlipidemia, unspecified: Secondary | ICD-10-CM

## 2016-01-11 DIAGNOSIS — I482 Chronic atrial fibrillation, unspecified: Secondary | ICD-10-CM

## 2016-01-12 LAB — COMPREHENSIVE METABOLIC PANEL
A/G RATIO: 1.2 (ref 1.1–2.5)
ALK PHOS: 113 IU/L (ref 39–117)
ALT: 10 IU/L (ref 0–44)
AST: 11 IU/L (ref 0–40)
Albumin: 3.6 g/dL (ref 3.5–4.8)
BILIRUBIN TOTAL: 0.4 mg/dL (ref 0.0–1.2)
BUN/Creatinine Ratio: 14 (ref 10–22)
BUN: 23 mg/dL (ref 8–27)
CALCIUM: 8.6 mg/dL (ref 8.6–10.2)
CO2: 20 mmol/L (ref 18–29)
Chloride: 97 mmol/L (ref 96–106)
Creatinine, Ser: 1.65 mg/dL — ABNORMAL HIGH (ref 0.76–1.27)
GFR calc Af Amer: 45 mL/min/{1.73_m2} — ABNORMAL LOW (ref >59)
GFR, EST NON AFRICAN AMERICAN: 39 mL/min/{1.73_m2} — AB (ref >59)
GLOBULIN, TOTAL: 2.9 g/dL (ref 1.5–4.5)
Glucose: 175 mg/dL — ABNORMAL HIGH (ref 65–99)
POTASSIUM: 5.1 mmol/L (ref 3.5–5.2)
SODIUM: 134 mmol/L (ref 134–144)
Total Protein: 6.5 g/dL (ref 6.0–8.5)

## 2016-01-12 LAB — CBC WITH DIFFERENTIAL/PLATELET
BASOS: 0 %
Basophils Absolute: 0 10*3/uL (ref 0.0–0.2)
EOS (ABSOLUTE): 0.2 10*3/uL (ref 0.0–0.4)
Eos: 2 %
Hematocrit: 36.6 % — ABNORMAL LOW (ref 37.5–51.0)
Hemoglobin: 12.2 g/dL — ABNORMAL LOW (ref 12.6–17.7)
IMMATURE GRANULOCYTES: 0 %
Immature Grans (Abs): 0 10*3/uL (ref 0.0–0.1)
Lymphocytes Absolute: 2.6 10*3/uL (ref 0.7–3.1)
Lymphs: 29 %
MCH: 27.2 pg (ref 26.6–33.0)
MCHC: 33.3 g/dL (ref 31.5–35.7)
MCV: 82 fL (ref 79–97)
MONOS ABS: 0.5 10*3/uL (ref 0.1–0.9)
Monocytes: 6 %
NEUTROS ABS: 5.7 10*3/uL (ref 1.4–7.0)
NEUTROS PCT: 63 %
PLATELETS: 233 10*3/uL (ref 150–379)
RBC: 4.48 x10E6/uL (ref 4.14–5.80)
RDW: 14.9 % (ref 12.3–15.4)
WBC: 9.1 10*3/uL (ref 3.4–10.8)

## 2016-01-12 LAB — LIPID PANEL
Chol/HDL Ratio: 5 ratio units (ref 0.0–5.0)
Cholesterol, Total: 140 mg/dL (ref 100–199)
HDL: 28 mg/dL — ABNORMAL LOW (ref >39)
LDL Calculated: 81 mg/dL (ref 0–99)
Triglycerides: 157 mg/dL — ABNORMAL HIGH (ref 0–149)
VLDL Cholesterol Cal: 31 mg/dL (ref 5–40)

## 2016-01-12 LAB — DIGOXIN LEVEL: Digoxin, Serum: 1.3 ng/mL — ABNORMAL HIGH (ref 0.5–0.9)

## 2016-01-12 LAB — HEMOGLOBIN A1C
Est. average glucose Bld gHb Est-mCnc: 137 mg/dL
HEMOGLOBIN A1C: 6.4 % — AB (ref 4.8–5.6)

## 2016-01-14 ENCOUNTER — Encounter: Payer: Medicare Other | Admitting: Nurse Practitioner

## 2016-01-14 ENCOUNTER — Ambulatory Visit (INDEPENDENT_AMBULATORY_CARE_PROVIDER_SITE_OTHER): Payer: Medicare Other | Admitting: Nurse Practitioner

## 2016-01-14 ENCOUNTER — Other Ambulatory Visit: Payer: Self-pay

## 2016-01-14 ENCOUNTER — Encounter: Payer: Self-pay | Admitting: Nurse Practitioner

## 2016-01-14 VITALS — BP 120/62 | HR 59 | Temp 98.5°F | Resp 20

## 2016-01-14 DIAGNOSIS — E785 Hyperlipidemia, unspecified: Secondary | ICD-10-CM

## 2016-01-14 DIAGNOSIS — E1159 Type 2 diabetes mellitus with other circulatory complications: Secondary | ICD-10-CM

## 2016-01-14 DIAGNOSIS — Z0000 Encounter for general adult medical examination without abnormal findings: Secondary | ICD-10-CM

## 2016-01-14 DIAGNOSIS — I10 Essential (primary) hypertension: Secondary | ICD-10-CM

## 2016-01-14 DIAGNOSIS — L97529 Non-pressure chronic ulcer of other part of left foot with unspecified severity: Secondary | ICD-10-CM

## 2016-01-14 DIAGNOSIS — E11621 Type 2 diabetes mellitus with foot ulcer: Secondary | ICD-10-CM | POA: Diagnosis not present

## 2016-01-14 DIAGNOSIS — I482 Chronic atrial fibrillation, unspecified: Secondary | ICD-10-CM

## 2016-01-14 DIAGNOSIS — E1140 Type 2 diabetes mellitus with diabetic neuropathy, unspecified: Secondary | ICD-10-CM

## 2016-01-14 DIAGNOSIS — I4891 Unspecified atrial fibrillation: Secondary | ICD-10-CM

## 2016-01-14 NOTE — Progress Notes (Signed)
Patient ID: [name], male   DOB: 1935/03/29, 81 y.o.   MRN: [id number]    PCP: Lauree Chandler, NP  Advanced Directive information Does patient have an advance directive?: Yes, Type of Advance Directive: Living will, Does patient want to make changes to advanced directive?: No - Patient declined  Allergies  Allergen Reactions  . Morphine Rash and Other (See Comments)    Irritability also    Chief Complaint  Patient presents with  . Annual Exam    Annual Exam, Labs printed     HPI: Patient is a 81 y.o. male seen in the office today for annual exam. Pt with no major illnesses or hospitalizations in the last year. conts to follow up with orthopedic for chronic diabetic ulcer. Screenings: Colon Cancer- last colonscopy ~ 4 years ago. Did not recommend follow up Prostate Cancer- no increase in urination or changes in stream, no family hx of prostate cancer   Depression screening Depression screen [facility] 2/9 01/14/2016 06/29/2015 12/29/2014 11/24/2014 08/26/2014  Decreased Interest 0 0 0 0 0  Down, Depressed, Hopeless 0 0 0 1 0  PHQ - 2 Score 0 0 0 1 0  no depression or anxiety noted  Falls Fall Risk  01/14/2016 11/18/2015 10/08/2015 06/29/2015 06/11/2015  Falls in the past year? No No No No No  Number falls in past yr: - - - - -  Risk for fall due to : - - - - -   MMSE MMSE - Mini Mental State Exam 01/14/2016 12/29/2014  Not completed: (No Data) -  Orientation to time 5 5  Orientation to Place 5 5  Registration 3 3  Attention/ Calculation 5 5  Recall 3 3  Language- name 2 objects 2 2  Language- repeat 1 1  Language- follow 3 step command 3 3  Language- read & follow direction 1 1  Write a sentence 1 1  Copy design 1 1  Total score 30 30   Vaccines Immunization History  Administered Date(s) Administered  . Influenza Split 08/22/2011  . Influenza Whole 11/21/2004, 08/22/2008, 09/03/2009, 08/12/2010  . Influenza,inj,Quad PF,36+ Mos 09/02/2013, 10/08/2015  .  Influenza-Unspecified 09/03/2014  . Pneumococcal Conjugate-13 06/29/2015  . Pneumococcal Polysaccharide-23 11/22/2003, 12/12/2012  . Td 11/21/2001  . Zoster 11/21/2008    Smoking status:.previous smoker ~ 4 years  Alcohol use: none  Dentist: every 6 months Ophthalmologist: yearly, aug 2016 Cardiologist- following routinely Dermatology- every 3 months Podiatrist- monthly for diabetic ulcers   Exercise regimen: exercises daily, no set amount of time ~ 30 Diet: diabetic diet.   Functional Status of ADLs: independent of all ADLs Incontinence- none  Review of Systems:  Review of Systems  Constitutional: Negative for fever and chills.  HENT: Negative for congestion.   Eyes:       Glasses  Respiratory: Negative for cough and shortness of breath.   Cardiovascular: Negative for chest pain and leg swelling.  Gastrointestinal: Negative for abdominal pain.  Genitourinary: Negative for dysuria.  Musculoskeletal: Positive for arthralgias (back pain). Negative for myalgias.  Skin: Negative for rash.        ulcers left toes followed by podiatry   Neurological: Negative for weakness.       Phantom pain in right leg  Psychiatric/Behavioral: The patient is not nervous/anxious.     Past Medical History  Diagnosis Date  . Fatigue   . Overweight(278.02)   . Syncope and collapse   . GERD (gastroesophageal reflux disease)   . Right bundle  branch block   . Unspecified venous (peripheral) insufficiency   . Phlebitis and thrombophlebitis of superficial vessels of lower extremities   . Cellulitis and abscess of leg, except foot   . Backache, unspecified   . Long term (current) use of anticoagulants   . Type II or unspecified type diabetes mellitus without mention of complication, not stated as uncontrolled   . Unspecified venous (peripheral) insufficiency   . Hypertension   . Gout, unspecified   . Heart failure   . Atrial fibrillation ([place])     permanent  . Esophageal stricture   . Hx  of colonic polyps 05-2006    (Adenomatous)Dr. [name]  . Diverticulosis 05-2006    Dr. [name]   . 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 Pacemaker- [name] 02/20/2013  . CHF (congestive heart failure) ([place])   . Skin cancer     squamous cell carcinoma scalp  . Hx of radiation therapy 06/16/14- 07/18/14    mult squamous cell carcinomas of scalp   Past Surgical History  Procedure Laterality Date  . Laminectomy    . Lumbar fusion    . Rotator cuff repair    . Ruptured rt rectus muscle    . Pacemaker insertion  8/12    SJM by [name] for tachy/brady syndrome  . I&d extremity Right 01/17/2014    Procedure: IRRIGATION AND DEBRIDEMENT RIGHT HEEL  WITH CULTURES AND BONE BIOPSY, placement of wound vac;  Surgeon: Theodoro Kos, DO;  Location: [place];  Service: Plastics;  Laterality: Right;  . Amputation Right 01/20/2014    Procedure: AMPUTATION BELOW KNEE- right;  Surgeon: [name], MD;  Location: [place];  Service: Orthopedics;  Laterality: Right;  Right Below Knee Amputation  . Amputation Right 02/14/2014    Procedure: AMPUTATION BELOW KNEE;  Surgeon: [name], MD;  Location: [place];  Service: Orthopedics;  Laterality: Right;  Right Below Knee Amputation Revision  . Amputation Right 03/07/2014    Procedure: AMPUTATION BELOW KNEE;  Surgeon: [name], MD;  Location: [place];  Service: Orthopedics;  Laterality: Right;  Right Below Knee Amputation Revision  . Amputation Right 03/18/2014    Procedure: revision of  AMPUTATION BELOW KNEE;  Surgeon: [name], MD;  Location: [place];  Service: Orthopedics;  Laterality: Right;  Revision Right Below Knee Amputation  . Above knee leg amputation Right 04/02/2014    DR DUDA  . Amputation Right 04/02/2014    Procedure: AMPUTATION ABOVE KNEE;  Surgeon: [name], MD;  Location: [place];  Service: Orthopedics;  Laterality: Right;  Right Above Knee Amputation  . Skin biopsy      scalp  . Eye surgery     Social History:   reports that he has  quit smoking. He has never used smokeless tobacco. He reports that he does not drink alcohol or use illicit drugs.  Family History  Problem Relation Age of Onset  . Pneumonia Mother   . Heart disease Father   . Diabetes Father   . Colon cancer Neg Hx     Medications: Patient's Medications  New Prescriptions   No medications on file  Previous Medications   ACCU-CHEK SMARTVIEW TEST STRIP       ASSURE COMFORT LANCETS 30G MISC       BLOOD GLUCOSE CALIBRATION (ACCU-CHEK SMARTVIEW CONTROL) LIQD       DIGOX 125 MCG TABLET    TAKE 1 TABLET ONCE DAILY.   DILTIAZEM (  CARDIZEM CD) 180 MG 24 HR CAPSULE    TAKE (1) CAPSULE DAILY.   ELIQUIS 5 MG TABS TABLET    TAKE 1 TABLET DAILY.   FUROSEMIDE (LASIX) 20 MG TABLET    TAKE ONE TABLET EVERY OTHER DAY.   GABAPENTIN (NEURONTIN) 100 MG CAPSULE    TAKE 1 CAPSULE EVERY TWELVE HOURS.   GLIMEPIRIDE (AMARYL) 2 MG TABLET    TAKE 1 TABLET IN THE MORNING WITH BREAKFAST.   HUMALOG KWIKPEN 100 UNIT/ML KIWKPEN    INJECT 8 UNITS BEFORE MEALS-HOLD FOR BLOOD SUGAR LESS THAN 100   INSULIN PEN NEEDLE (B-D ULTRAFINE III SHORT PEN) 31G X 8 MM MISC    Use four times daily the the administration of insulin E11.9   LANTUS SOLOSTAR 100 UNIT/ML SOLOSTAR PEN    INJECT 24 UNITS AT BEDTIME   LISINOPRIL (PRINIVIL,ZESTRIL) 10 MG TABLET    TAKE 1 TABLET EACH DAY.   NITROGLYCERIN (NITRODUR - DOSED IN MG/24 HR) 0.2 MG/HR PATCH    Place 1 patch onto the skin daily.   NUTRITIONAL SUPPLEMENTS (FEEDING SUPPLEMENT, GLUCERNA 1.2 CAL,) LIQD    Take 237 mLs by mouth daily as needed.    OXYCODONE HCL 10 MG TABS    Take 1 tablet (10 mg total) by mouth every 8 (eight) hours as needed. For pain   SENNA-DOCUSATE (SENNA-S) 8.6-50 MG PER TABLET    Take 1 tablet by mouth daily as needed.   Modified Medications   No medications on file  Discontinued Medications   GUAIFENESIN (MUCINEX) 600 MG 12 HR TABLET    Take 2 tablets (1,200 mg total) by mouth 2 (two) times daily.   INSULIN SYRINGE-NEEDLE  U-100 (INSULIN SYRINGE .3CC/31GX5/16") 31G X 5/16" 0.3 ML MISC    0.3 mL 31 x 5/16"   MELATONIN 5 MG TABS    Take 5 mg by mouth at bedtime as needed (insomnia). Reported on 01/14/2016   NALOXEGOL OXALATE (MOVANTIK) 12.5 MG TABS TABLET    Take 1 tablet (12.5 mg total) by mouth daily.     Physical Exam:  Filed Vitals:   01/14/16 1453  BP: 120/62  Pulse: 59  Temp: 98.5 F (36.9 C)  TempSrc: Oral  Resp: 20  SpO2: 97%   There is no weight on file to calculate BMI.  Physical Exam  Constitutional: He is oriented to person, place, and time. He appears well-developed and well-nourished. No distress.  HENT:  Head: Normocephalic and atraumatic.  Right Ear: External ear normal.  Left Ear: External ear normal.  Nose: Nose normal.  Mouth/Throat: Oropharynx is clear and moist. No oropharyngeal exudate.  Eyes: Conjunctivae and EOM are normal. Pupils are equal, round, and reactive to light.  glasses  Neck: Normal range of motion. Neck supple. No thyromegaly present.  Cardiovascular:  irreg irreg; right AKA  Pulmonary/Chest: Effort normal and breath sounds normal.  Abdominal: Soft. Bowel sounds are normal. He exhibits no distension. There is no tenderness.  Musculoskeletal: Normal range of motion. He exhibits no edema or tenderness.  Right AKA  Lymphadenopathy:    He has no cervical adenopathy.  Neurological: He is alert and oriented to person, place, and time.  Skin: Skin is warm and dry. He is not diaphoretic.   left great toe ulcer open with necrosis and min erythema to great toe  Psychiatric: He has a normal mood and affect.    Labs reviewed: Basic Metabolic Panel:  Recent Labs  01/29/15 1618 06/29/15 1651 01/11/16 1509  NA 136 135  134  K 4.8 5.0 5.1  CL 100 99 97  CO2 20 20 20   GLUCOSE 165* 254* 175*  BUN 26 31* 23  CREATININE 1.63* 1.57* 1.65*  CALCIUM 8.9 9.1 8.6   Liver Function Tests:  Recent Labs  01/11/16 1509  AST 11  ALT 10  ALKPHOS 113  BILITOT 0.4    PROT 6.5  ALBUMIN 3.6   No results for input(s): LIPASE, AMYLASE in the last 8760 hours. No results for input(s): AMMONIA in the last 8760 hours. CBC:  Recent Labs  06/29/15 1651 01/11/16 1509  WBC 9.1 9.1  NEUTROABS 6.4 5.7  HCT 37.7 36.6*  MCV 83 82  PLT 241 233   Lipid Panel:  Recent Labs  01/11/16 1509  CHOL 140  HDL 28*  LDLCALC 81  TRIG 157*  CHOLHDL 5.0   TSH: No results for input(s): TSH in the last 8760 hours. A1C: Lab Results  Component Value Date   HGBA1C 6.4* 01/11/2016     Assessment/Plan 1. Essential hypertension -blood pressure remains stable, conts current regimen - EKG 12-Lead  2. Chronic atrial fibrillation (HCC) -rate controlled, dig level elevated on last labs, decreased digoxin to 125 mcg 1/2 tablet daily and will have pt follow up dig level in 6 weeks.  hgb reviewed with pt and stable. conts on eliquis for anticoagulation.   3. Hyperlipidemia LDL stable, conts heart healthy diet  4. Type 2 diabetes mellitus with other circulatory complications (HCC) 123456 improved, conts with lifestyle modifications. Following with podiatry monthly due to diabetic ulcer.   5. Diabetic ulcer of left foot associated with type 2 diabetes mellitus ([place]) -remains stable. Ongoing follow up with podiatry.   6. Medicare annual wellness visit, subsequent -pt doing well. Without new issues noted.  -MMSE 30/30 -very independent despite having right AKA   Discussed exercise of 30 mins 5 days a week -cont heart healthy, diabetic diet - pt up to date on colonoscopy.  -no falls in the last year and negative depression screening   Jessica K. Harle Battiest  [facility] [phone] 8 am - 5 pm) [phone] (after hours)

## 2016-01-14 NOTE — Patient Instructions (Signed)
Follow up in 6 weeks for dig level  Follow up in 6 months for routine follow up with labs prior to visit

## 2016-01-28 ENCOUNTER — Other Ambulatory Visit: Payer: Self-pay

## 2016-01-28 DIAGNOSIS — M549 Dorsalgia, unspecified: Principal | ICD-10-CM

## 2016-01-28 DIAGNOSIS — G8929 Other chronic pain: Secondary | ICD-10-CM

## 2016-01-28 MED ORDER — OXYCODONE HCL 10 MG PO TABS: 10.0000 mg | ORAL_TABLET | Freq: Three times a day (TID) | ORAL | Status: DC | PRN

## 2016-02-03 ENCOUNTER — Other Ambulatory Visit: Payer: Self-pay | Admitting: Nurse Practitioner

## 2016-02-08 ENCOUNTER — Other Ambulatory Visit: Payer: Self-pay | Admitting: Nurse Practitioner

## 2016-02-15 ENCOUNTER — Other Ambulatory Visit: Payer: Medicare Other

## 2016-02-22 ENCOUNTER — Other Ambulatory Visit: Payer: Medicare Other

## 2016-02-26 ENCOUNTER — Other Ambulatory Visit: Payer: Medicare Other

## 2016-03-07 ENCOUNTER — Other Ambulatory Visit: Payer: Medicare Other

## 2016-03-09 ENCOUNTER — Other Ambulatory Visit: Payer: Self-pay | Admitting: *Deleted

## 2016-03-09 DIAGNOSIS — G8929 Other chronic pain: Secondary | ICD-10-CM

## 2016-03-09 DIAGNOSIS — M549 Dorsalgia, unspecified: Principal | ICD-10-CM

## 2016-03-09 MED ORDER — OXYCODONE HCL 10 MG PO TABS: 10.0000 mg | ORAL_TABLET | Freq: Three times a day (TID) | ORAL | Status: DC | PRN

## 2016-03-14 ENCOUNTER — Other Ambulatory Visit: Payer: Medicare Other

## 2016-03-14 ENCOUNTER — Encounter: Payer: Self-pay | Admitting: Podiatry

## 2016-03-14 ENCOUNTER — Other Ambulatory Visit: Payer: Self-pay | Admitting: Nurse Practitioner

## 2016-03-14 ENCOUNTER — Ambulatory Visit (INDEPENDENT_AMBULATORY_CARE_PROVIDER_SITE_OTHER): Payer: Medicare Other | Admitting: Podiatry

## 2016-03-14 VITALS — Temp 98.7°F

## 2016-03-14 DIAGNOSIS — E1140 Type 2 diabetes mellitus with diabetic neuropathy, unspecified: Secondary | ICD-10-CM | POA: Diagnosis not present

## 2016-03-14 DIAGNOSIS — B351 Tinea unguium: Secondary | ICD-10-CM

## 2016-03-14 DIAGNOSIS — M79676 Pain in unspecified toe(s): Secondary | ICD-10-CM | POA: Diagnosis not present

## 2016-03-14 DIAGNOSIS — L97502 Non-pressure chronic ulcer of other part of unspecified foot with fat layer exposed: Secondary | ICD-10-CM

## 2016-03-14 DIAGNOSIS — I739 Peripheral vascular disease, unspecified: Secondary | ICD-10-CM

## 2016-03-16 NOTE — Progress Notes (Signed)
Subjective:     Patient ID: [name], male   DOB: 1935/10/11, 81 y.o.   MRN: [id number]  HPI patient presents stating that he feels like the area on the left foot looks better but it is crusted on the left hallux and second toe and he knows he is at risk of losing this leg and he does have nail disease of the nails on the left foot. Has had previous amputation of his right leg   Review of Systems     Objective:   Physical Exam Neurovascular status diminished but was checked about 6 months ago with indications that it is adequate flow with continued breakdown of tissue distal left hallux second toe and thick nail disease 1 through 5 left foot    Assessment:     At risk condition left hallux second toe that's not hurting him but is crusted with nail disease 1-5 left    Plan:     Today I debrided the tissue and applied Iodosorb with sterile dressings and instructed on home therapy for this and instructed him that if any redness were to occur or any drainage or pain to reappoint immediately and that there is a very good chance he is given a loose these toes and one point in the future and may possibly end up requiring further amputation. He understands this completely but does not want to go this route at this point. We will continue to provide care and watch it carefully and I did show this case to Dr. Jacqualyn Posey who agrees with me. I debrided nailbeds 1-5 left and also spoke to his wife and explained the risk and the fact that he has a high risk right rotation

## 2016-03-21 ENCOUNTER — Other Ambulatory Visit: Payer: Medicare Other

## 2016-03-21 ENCOUNTER — Telehealth: Payer: Self-pay | Admitting: *Deleted

## 2016-03-21 DIAGNOSIS — I4891 Unspecified atrial fibrillation: Secondary | ICD-10-CM | POA: Diagnosis not present

## 2016-03-21 MED ORDER — FUROSEMIDE 20 MG PO TABS: 20.0000 mg | ORAL_TABLET | ORAL | Status: DC

## 2016-03-21 MED ORDER — APIXABAN 5 MG PO TABS: 5.0000 mg | ORAL_TABLET | Freq: Every day | ORAL | Status: DC

## 2016-03-21 MED ORDER — DILTIAZEM HCL ER COATED BEADS 180 MG PO CP24
180.0000 mg | ORAL_CAPSULE | Freq: Every day | ORAL | Status: DC
Start: 2016-03-21 — End: 2017-05-13

## 2016-03-21 MED ORDER — DIGOXIN 125 MCG PO TABS: 125.0000 ug | ORAL_TABLET | Freq: Every day | ORAL | Status: DC

## 2016-03-21 MED ORDER — GABAPENTIN 100 MG PO CAPS: 100.0000 mg | ORAL_CAPSULE | Freq: Two times a day (BID) | ORAL | Status: DC

## 2016-03-21 MED ORDER — INSULIN LISPRO 100 UNIT/ML (KWIKPEN): PEN_INJECTOR | SUBCUTANEOUS | Status: DC

## 2016-03-21 MED ORDER — GLIMEPIRIDE 2 MG PO TABS: 2.0000 mg | ORAL_TABLET | Freq: Every day | ORAL | Status: DC

## 2016-03-21 MED ORDER — INSULIN GLARGINE 100 UNIT/ML SOLOSTAR PEN: PEN_INJECTOR | SUBCUTANEOUS | Status: DC

## 2016-03-21 MED ORDER — LISINOPRIL 10 MG PO TABS: 10.0000 mg | ORAL_TABLET | Freq: Every day | ORAL | Status: DC

## 2016-03-21 NOTE — Telephone Encounter (Signed)
rx sent to pharmacy by e-script  

## 2016-03-22 LAB — DIGOXIN LEVEL: Digoxin, Serum: 0.6 ng/mL (ref 0.5–0.9)

## 2016-03-25 ENCOUNTER — Telehealth: Payer: Self-pay | Admitting: *Deleted

## 2016-03-25 NOTE — Telephone Encounter (Signed)
A DNA Drug Sensitivity Test Requisition Form was left on my desk from being at a facility with a note attached stating patient will call with a Fax number to send this from to him first before we sign.  Form was from [place] [phone] Fax: [phone] Patient called back today with fax #: [phone] Faxed to patient.

## 2016-03-28 NOTE — Telephone Encounter (Signed)
Patient called back today and stated DO NOT give this place any of his information until he lets [place] know. Stated that he called this place but they cannot tell him why or who is requesting the information on him. (patient request to hold paperwork until we hear back from him, on desk)

## 2016-03-29 ENCOUNTER — Other Ambulatory Visit: Payer: Self-pay | Admitting: Nurse Practitioner

## 2016-03-31 ENCOUNTER — Telehealth: Payer: Self-pay | Admitting: *Deleted

## 2016-03-31 MED ORDER — DICLOFENAC SODIUM 1.5 % TD SOLN: TRANSDERMAL | Status: DC

## 2016-03-31 NOTE — Telephone Encounter (Signed)
It's fine to fill this.  Which body part is he using this on?

## 2016-03-31 NOTE — Telephone Encounter (Signed)
Patient called and wanted to know if we could prescribe his Arthritis lotion Pennsaid 1.5 %w/w. Its expensive but he want the generic. Dr. Benay Pillow had prescribed in the past. He uses two to three drops daily as needed. Would like to have it faxed to the pharmacy at [facility]. (patient states not to call him back, just fax in Rx) Please Advise.

## 2016-03-31 NOTE — Telephone Encounter (Signed)
rx sent to pharmacy by e-script, looks like this was last used in 2014

## 2016-04-07 ENCOUNTER — Other Ambulatory Visit: Payer: Self-pay | Admitting: Nurse Practitioner

## 2016-04-07 DIAGNOSIS — Z85828 Personal history of other malignant neoplasm of skin: Secondary | ICD-10-CM | POA: Diagnosis not present

## 2016-04-07 DIAGNOSIS — C44619 Basal cell carcinoma of skin of left upper limb, including shoulder: Secondary | ICD-10-CM | POA: Diagnosis not present

## 2016-04-07 DIAGNOSIS — D0421 Carcinoma in situ of skin of right ear and external auricular canal: Secondary | ICD-10-CM | POA: Diagnosis not present

## 2016-04-07 DIAGNOSIS — L570 Actinic keratosis: Secondary | ICD-10-CM | POA: Diagnosis not present

## 2016-04-07 DIAGNOSIS — D485 Neoplasm of uncertain behavior of skin: Secondary | ICD-10-CM | POA: Diagnosis not present

## 2016-04-07 DIAGNOSIS — D044 Carcinoma in situ of skin of scalp and neck: Secondary | ICD-10-CM | POA: Diagnosis not present

## 2016-04-07 DIAGNOSIS — C44329 Squamous cell carcinoma of skin of other parts of face: Secondary | ICD-10-CM | POA: Diagnosis not present

## 2016-04-07 DIAGNOSIS — L821 Other seborrheic keratosis: Secondary | ICD-10-CM | POA: Diagnosis not present

## 2016-04-07 DIAGNOSIS — C44519 Basal cell carcinoma of skin of other part of trunk: Secondary | ICD-10-CM | POA: Diagnosis not present

## 2016-04-07 DIAGNOSIS — D0439 Carcinoma in situ of skin of other parts of face: Secondary | ICD-10-CM | POA: Diagnosis not present

## 2016-04-14 ENCOUNTER — Other Ambulatory Visit: Payer: Self-pay | Admitting: *Deleted

## 2016-04-14 DIAGNOSIS — M549 Dorsalgia, unspecified: Principal | ICD-10-CM

## 2016-04-14 DIAGNOSIS — G8929 Other chronic pain: Secondary | ICD-10-CM

## 2016-04-14 MED ORDER — OXYCODONE HCL 10 MG PO TABS: 10.0000 mg | ORAL_TABLET | Freq: Three times a day (TID) | ORAL | Status: DC | PRN

## 2016-04-14 NOTE — Telephone Encounter (Signed)
Left message on machine that rx is ready for pick-up, and it will be at our front desk.  

## 2016-04-14 NOTE — Telephone Encounter (Signed)
Patient requested and will pick up 

## 2016-04-27 ENCOUNTER — Other Ambulatory Visit: Payer: Self-pay | Admitting: *Deleted

## 2016-04-27 MED ORDER — GLUCOSE BLOOD VI STRP: ORAL_STRIP | Status: DC

## 2016-04-27 NOTE — Telephone Encounter (Signed)
[facility]  

## 2016-04-29 ENCOUNTER — Encounter: Payer: Self-pay | Admitting: Podiatry

## 2016-04-29 ENCOUNTER — Ambulatory Visit (INDEPENDENT_AMBULATORY_CARE_PROVIDER_SITE_OTHER): Payer: Medicare Other | Admitting: Podiatry

## 2016-04-29 ENCOUNTER — Telehealth: Payer: Self-pay | Admitting: *Deleted

## 2016-04-29 ENCOUNTER — Telehealth: Payer: Self-pay

## 2016-04-29 VITALS — BP 115/58 | HR 76 | Resp 12

## 2016-04-29 DIAGNOSIS — E1140 Type 2 diabetes mellitus with diabetic neuropathy, unspecified: Secondary | ICD-10-CM

## 2016-04-29 DIAGNOSIS — L97502 Non-pressure chronic ulcer of other part of unspecified foot with fat layer exposed: Secondary | ICD-10-CM

## 2016-04-29 DIAGNOSIS — R0989 Other specified symptoms and signs involving the circulatory and respiratory systems: Secondary | ICD-10-CM

## 2016-04-29 MED ORDER — CEPHALEXIN 500 MG PO CAPS: 500.0000 mg | ORAL_CAPSULE | Freq: Three times a day (TID) | ORAL | Status: DC

## 2016-04-29 MED ORDER — GLUCOSE BLOOD VI STRP: ORAL_STRIP | Status: DC

## 2016-04-29 NOTE — Progress Notes (Signed)
Subjective:     Patient ID: [name], male   DOB: 1935-01-25, 81 y.o.   MRN: [id number]  HPI patient states I'm still getting drainage and my big toe second toe left and it seems like it's getting worse gradually over time and I'm desperate to maintain my left   Review of Systems     Objective:   Physical Exam Neurovascular status is not changed from previous visit with continued demarcation of the left hallux second toe distal with crusted tissue formation and breakdown of tissue extending into the more proximal portion of the second toe left    Assessment:     Continued breakdown of tissue with patient with chronic vascular disease was lost the right leg    Plan:     H&P and I reviewed condition. At this point we are sending him for vascular evaluation for consideration of ray resection first and second metatarsal to try to stop this process and hopefully prevent him from losing his leg. I did place him on cephalexin 500 mg 3 times a day to try to control any infection present

## 2016-04-29 NOTE — Telephone Encounter (Signed)
Patient called to clarify that he is using accu-chek aviva plus and NOT accu-chek smartview. Medications list updated and rx sent to mail order company

## 2016-04-29 NOTE — Telephone Encounter (Signed)
Left message on voicemail for patient to return call when available , reason for call clarify name of current glucose testing machine and to update medication list

## 2016-04-29 NOTE — Telephone Encounter (Addendum)
Orders for LE left Arterial dopplers and Left ABI with TBI faxed to [place]. 05/17/2016-Pt called for doppler results. 05/18/2016-Dr. Regal reviewed the doppler results of 05/05/2016, states does not inform as to the healing status remaining in pt left foot and leg for healing if amputation of toes was needed. I told pt that Dr. [name] had reviewed the results but needed clarification as to his healing status, and we were writing a note to Dr. Scot Dock. 05/19/2016-Left message [facility] Triage Nurse Line to call with clarification of pt's testing 05/05/2016 results as related to his healing and circulation status for possible toe amputation surgery consideration. Arbie Cookey - [facility] states pt has not been evaluated by [facility] physicians only tested and pt would benefit from doctor's evaluation rather than review of test. Left message informing pt of referral to [facility] and their appt line. 06/10/2016-Pt states he is scheduled for Monday to see Dr. [name], would like to know if he needed to refill the antibiotic. I spoke with pt and he said the foot is looking better, but has a lot of drainage.  Dr. [name] ordered refill the Cephalexin as previous.

## 2016-05-05 ENCOUNTER — Ambulatory Visit (HOSPITAL_COMMUNITY)
Admission: RE | Admit: 2016-05-05 | Discharge: 2016-05-05 | Disposition: A | Discharge status: Home / Self Care | Payer: Medicare Other | Source: Ambulatory Visit | Attending: Podiatry | Admitting: Podiatry

## 2016-05-05 DIAGNOSIS — M109 Gout, unspecified: Secondary | ICD-10-CM | POA: Insufficient documentation

## 2016-05-05 DIAGNOSIS — K219 Gastro-esophageal reflux disease without esophagitis: Secondary | ICD-10-CM | POA: Insufficient documentation

## 2016-05-05 DIAGNOSIS — I110 Hypertensive heart disease with heart failure: Secondary | ICD-10-CM | POA: Diagnosis not present

## 2016-05-05 DIAGNOSIS — Z7901 Long term (current) use of anticoagulants: Secondary | ICD-10-CM | POA: Diagnosis not present

## 2016-05-05 DIAGNOSIS — L97502 Non-pressure chronic ulcer of other part of unspecified foot with fat layer exposed: Secondary | ICD-10-CM | POA: Diagnosis not present

## 2016-05-05 DIAGNOSIS — Z950 Presence of cardiac pacemaker: Secondary | ICD-10-CM | POA: Diagnosis not present

## 2016-05-05 DIAGNOSIS — R0989 Other specified symptoms and signs involving the circulatory and respiratory systems: Secondary | ICD-10-CM | POA: Diagnosis not present

## 2016-05-05 DIAGNOSIS — I509 Heart failure, unspecified: Secondary | ICD-10-CM | POA: Diagnosis not present

## 2016-05-05 DIAGNOSIS — E1140 Type 2 diabetes mellitus with diabetic neuropathy, unspecified: Secondary | ICD-10-CM

## 2016-05-05 DIAGNOSIS — I4510 Unspecified right bundle-branch block: Secondary | ICD-10-CM | POA: Diagnosis not present

## 2016-05-05 NOTE — Progress Notes (Signed)
VASCULAR LAB PRELIMINARY  ARTERIAL  ABI completed:    RIGHT    LEFT    PRESSURE WAVEFORM  PRESSURE WAVEFORM  BRACHIAL 134 Triphasic BRACHIAL 121 Triphasic  DP  AKA DP 120 Monophasic  AT  AKA AT    PT  AKA PT 73 Dampened monophasic  PER  AKA PER    GREAT TOE  AKA GREAT TOE  NA    RIGHT LEFT  ABI AKA 0.9    The left dorsalis pedis artery ABI is suggestive of mild arterial insufficiency at rest, however abnormal waveforms suggest that the ABI may be falsely elevated due to medial arterial calcification.   The left posterior tibial artery ABI is suggestive of moderate, borderline severe arterial insufficiency at rest.  Left great toe pressure was not obtained due to open wound.   05/05/2016 4:01 PM Maudry Mayhew, RVT, RDCS, RDMS

## 2016-05-11 DIAGNOSIS — C44329 Squamous cell carcinoma of skin of other parts of face: Secondary | ICD-10-CM | POA: Diagnosis not present

## 2016-05-17 ENCOUNTER — Other Ambulatory Visit: Payer: Self-pay | Admitting: *Deleted

## 2016-05-17 DIAGNOSIS — G8929 Other chronic pain: Secondary | ICD-10-CM

## 2016-05-17 DIAGNOSIS — M549 Dorsalgia, unspecified: Principal | ICD-10-CM

## 2016-05-17 MED ORDER — OXYCODONE HCL 10 MG PO TABS: 10.0000 mg | ORAL_TABLET | Freq: Three times a day (TID) | ORAL | Status: DC | PRN

## 2016-05-17 NOTE — Telephone Encounter (Signed)
Patient requested and will pick up 

## 2016-05-18 DIAGNOSIS — C44619 Basal cell carcinoma of skin of left upper limb, including shoulder: Secondary | ICD-10-CM | POA: Diagnosis not present

## 2016-05-20 ENCOUNTER — Other Ambulatory Visit: Payer: Self-pay | Admitting: Nurse Practitioner

## 2016-05-23 ENCOUNTER — Encounter: Payer: Self-pay | Admitting: Vascular Surgery

## 2016-05-25 ENCOUNTER — Encounter: Payer: Self-pay | Admitting: Vascular Surgery

## 2016-05-25 ENCOUNTER — Ambulatory Visit (INDEPENDENT_AMBULATORY_CARE_PROVIDER_SITE_OTHER): Payer: Medicare Other | Admitting: Vascular Surgery

## 2016-05-25 VITALS — BP 123/69 | HR 75 | Temp 98.1°F | Ht 70.0 in | Wt 171.0 lb

## 2016-05-25 DIAGNOSIS — I7025 Atherosclerosis of native arteries of other extremities with ulceration: Secondary | ICD-10-CM | POA: Diagnosis not present

## 2016-05-25 NOTE — Progress Notes (Signed)
[facility] [place]  Patient name: [name] MRN: [id number] DOB: 09-19-1935 Sex: male  REASON FOR CONSULT: poor circulation with left second toe wound. Referred by [facility].  HPI: [name] is a 81 y.o. male, who is status post right above-the-knee amputation for diabetic foot infection. This was done in 2015 by Dr. Sharol Given. He states that he developed a small wound on his left heel in January 2016 but ultimately this healed. He set swelling developed wounds on the end of his left first and second toes which have been slow to heal and he is followed by Dr. Ila Mcgill. Given that he's had these wounds for some time without successful healing he was sent for evaluation for possible ray amputation of the first and second toes.  He does have a prosthesis for his right AKA but states that he rarely uses it. He states that it simply easier to get around in his wheelchair. He denies any history of rest pain in the left foot.   Have reviewed the records that were sent with the patient. The patient has had a wound on his great toe and second toe of the left foot which has gradually been getting worse. He was sent for vascular evaluation for consideration of ray amputation of the first and second toes. He was placed on Keflex 500 mg 3 times a day.  Past Medical History  Diagnosis Date  . Fatigue   . Overweight(278.02)   . Syncope and collapse   . GERD (gastroesophageal reflux disease)   . Right bundle branch block   . Unspecified venous (peripheral) insufficiency   . Phlebitis and thrombophlebitis of superficial vessels of lower extremities   . Cellulitis and abscess of leg, except foot   . Backache, unspecified   . Long term (current) use of anticoagulants   . Type II or unspecified type diabetes mellitus without mention of complication, not stated as uncontrolled   . Unspecified venous (peripheral) insufficiency   . Hypertension   . Gout,  unspecified   . Heart failure   . Atrial fibrillation ([place])     permanent  . Esophageal stricture   . Hx of colonic polyps 05-2006    (Adenomatous)Dr. [name]  . Diverticulosis 05-2006    Dr. [name]   . 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 Pacemaker- [name] 02/20/2013  . CHF (congestive heart failure) ([place])   . Skin cancer     squamous cell carcinoma scalp  . Hx of radiation therapy 06/16/14- 07/18/14    mult squamous cell carcinomas of scalp    Family History  Problem Relation Age of Onset  . Pneumonia Mother   . Heart disease Father   . Diabetes Father   . Colon cancer Neg Hx     SOCIAL HISTORY: Social History   Social History  . Marital Status: Married    Spouse Name: [name]  . Number of Children: 2  . Years of Education: [name]   Occupational History  . Retired Hotel manager    Social History Main Topics  . Smoking status: Former Research scientist (life sciences)  . Smokeless tobacco: Never Used     Comment: Quit at age 70   . Alcohol Use: No     Comment: none currenlty  . Drug Use: No  . Sexual Activity: Not Currently   Other Topics Concern  . Not on file   Social History Narrative   2 caffeine drinks daily  Stopped smoking 1963   Married   Exercise bar bells (R) leg amputation   Alcohol none        Allergies  Allergen Reactions  . Morphine Rash and Other (See Comments)    Irritability also    Current Outpatient Prescriptions  Medication Sig Dispense Refill  . apixaban (ELIQUIS) 5 MG TABS tablet Take 1 tablet (5 mg total) by mouth daily. 90 tablet 3  . B-D ULTRAFINE III SHORT PEN 31G X 8 MM MISC USE AS DIRECTED 100 each 0  . cephALEXin (KEFLEX) 500 MG capsule Take 1 capsule (500 mg total) by mouth 3 (three) times daily. 60 capsule 1  . Diclofenac Sodium (PENNSAID) 1.5 % SOLN Place on skin 2-3 times daily as needed. 150 mL 0  . digoxin (DIGOX) 0.125 MG tablet Take 1 tablet (125 mcg total) by mouth daily. 90 tablet 3  . diltiazem (CARDIZEM CD) 180 MG 24 hr capsule Take 1  capsule (180 mg total) by mouth daily. 90 capsule 3  . furosemide (LASIX) 20 MG tablet Take 1 tablet (20 mg total) by mouth every other day. 45 tablet 3  . gabapentin (NEURONTIN) 100 MG capsule Take 1 capsule (100 mg total) by mouth 2 (two) times daily. 180 capsule 3  . glimepiride (AMARYL) 2 MG tablet Take 1 tablet (2 mg total) by mouth daily with breakfast. 90 tablet 3  . glucose blood (ACCU-CHEK AVIVA PLUS) test strip Use to test blood sugar three times daily. Dx E11.59 300 each 3  . Insulin Glargine (LANTUS SOLOSTAR) 100 UNIT/ML Solostar Pen INJECT 22 UNITS AT BEDTIME 15 mL 5  . insulin lispro (HUMALOG KWIKPEN) 100 UNIT/ML KiwkPen INJECT 8 UNITS BEFORE MEALS-HOLD FOR BLOOD SUGAR LESS THAN 100 15 mL 5  . lisinopril (PRINIVIL,ZESTRIL) 10 MG tablet Take 1 tablet (10 mg total) by mouth daily. 90 tablet 3  . nitroGLYCERIN (NITRODUR - DOSED IN MG/24 HR) 0.2 mg/hr patch Place 1 patch onto the skin daily.    Marland Kitchen omeprazole (PRILOSEC) 20 MG capsule TAKE (1) CAPSULE DAILY. 90 capsule 0  . Oxycodone HCl 10 MG TABS Take 1 tablet (10 mg total) by mouth every 8 (eight) hours as needed. For pain 90 tablet 0  . senna-docusate (SENNA-S) 8.6-50 MG per tablet Take 1 tablet by mouth daily as needed.     . Nutritional Supplements (FEEDING SUPPLEMENT, GLUCERNA 1.2 CAL,) LIQD Take 237 mLs by mouth daily as needed. Reported on 05/25/2016     No current facility-administered medications for this visit.    REVIEW OF SYSTEMS:  [X]  denotes positive finding, [ ]  denotes negative finding Cardiac  Comments:  Chest pain or chest pressure:    Shortness of breath upon exertion:    Short of breath when lying flat:    Irregular heart rhythm:        Vascular    Pain in calf, thigh, or hip brought on by ambulation:    Pain in feet at night that wakes you up from your sleep:     Blood clot in your veins:    Leg swelling:         Pulmonary    Oxygen at home:    Productive cough:     Wheezing:         Neurologic      Sudden weakness in arms or legs:     Sudden numbness in arms or legs:     Sudden onset of difficulty speaking or slurred speech:  Temporary loss of vision in one eye:     Problems with dizziness:         Gastrointestinal    Blood in stool:     Vomited blood:         Genitourinary    Burning when urinating:     Blood in urine:        Psychiatric    Major depression:         Hematologic    Bleeding problems:    Problems with blood clotting too easily:        Skin    Rashes or ulcers:        Constitutional    Fever or chills:      PHYSICAL EXAM: Filed Vitals:   05/25/16 1506  BP: 123/69  Pulse: 75  Temp: 98.1 F (36.7 C)  TempSrc: Oral  Height: 5\' 10"  (1.778 m)  Weight: 171 lb (77.565 kg)  SpO2: 98%    GENERAL: The patient is a well-nourished male, in no acute distress. The vital signs are documented above. CARDIAC: There is a regular rate and rhythm.  VASCULAR: I do not detect carotid bruits. On the left side, which is the site of concern, he has a palpable femoral pulse, popliteal pulse, and dorsalis pedis pulse. In my exam he has a biphasic dorsalis pedis signal with the Doppler and a monophasic posterior tibial signal. On the right side, the side of his AKA, he has a palpable femoral pulse. PULMONARY: There is good air exchange bilaterally without wheezing or rales. ABDOMEN: Soft and non-tender with normal pitched bowel sounds.  MUSCULOSKELETAL: he has a right AKA. NEUROLOGIC: No focal weakness or paresthesias are detected. SKIN: He has wounds on the left first and second toes with dry eschar and some cellulitis in the toes and forefoot. PSYCHIATRIC: The patient has a normal affect.  DATA:   ARTERIAL DOPPLER STUDY: I have reviewed the arterial Doppler study that was done on 05/05/2016. This shows a monophasic anterior tibial signal on the left and a dampened monophasic posterior tibial signal. ABI is 90% However, this may be falsely elevated because of  calcific disease. A toe pressure could not be obtained.  MEDICAL ISSUES:  NONHEALING WOUNDS OF THE LEFT FIRST AND SECOND TOES: this patient has had nonhealing wounds of the left first and second toes for many months now. He is currently on Keflex which I would continue. Based on my exam , he has a biphasic dorsalis pedis signal and a palpable dorsalis pedis pulse on the left. Therefore, I think he does have adequate circulation to heal a first and second toe amputation. However, he is reluctant to proceed at this time. I have encouraged him to continue with aggressive outpatient care and if the toes failed to healed and certainly we can schedule him for ray amputation of the left first and second toes. Although he has a palpable dorsalis pedis pulse and I think he does have adequate circulation to heal this, he does understand that there would be some risk of nonhealing toe amputations. Fortunately he is not a smoker. We have discussed the importance of nutrition. See him back in 2 months unless he call sooner.   Deitra Mayo [facility] [place] [phone]

## 2016-06-10 ENCOUNTER — Telehealth: Payer: Self-pay | Admitting: *Deleted

## 2016-06-10 MED ORDER — CEPHALEXIN 500 MG PO CAPS: 500.0000 mg | ORAL_CAPSULE | Freq: Three times a day (TID) | ORAL | Status: DC

## 2016-06-10 NOTE — Telephone Encounter (Signed)
Pt called to see if he needed an appt.  I told D. Miner to schedule pt to discuss with Dr. [name] his visit with [facility].

## 2016-06-13 ENCOUNTER — Encounter: Payer: Self-pay | Admitting: Podiatry

## 2016-06-13 ENCOUNTER — Ambulatory Visit: Payer: Medicare Other | Admitting: Podiatry

## 2016-06-13 ENCOUNTER — Ambulatory Visit (INDEPENDENT_AMBULATORY_CARE_PROVIDER_SITE_OTHER): Payer: Medicare Other | Admitting: Podiatry

## 2016-06-13 VITALS — BP 124/66 | HR 72 | Resp 16

## 2016-06-13 DIAGNOSIS — I739 Peripheral vascular disease, unspecified: Secondary | ICD-10-CM

## 2016-06-13 DIAGNOSIS — L97411 Non-pressure chronic ulcer of right heel and midfoot limited to breakdown of skin: Secondary | ICD-10-CM

## 2016-06-13 DIAGNOSIS — R0989 Other specified symptoms and signs involving the circulatory and respiratory systems: Secondary | ICD-10-CM

## 2016-06-15 ENCOUNTER — Other Ambulatory Visit: Payer: Self-pay | Admitting: Internal Medicine

## 2016-06-15 NOTE — Progress Notes (Signed)
Subjective:     Patient ID: [name], male   DOB: 03-Mar-1935, 81 y.o.   MRN: [id number]  HPI patient presents stating I seem to be doing okay stain on my antibiotic and so far I've not lost any toe or require amputation   Review of Systems     Objective:   Physical Exam No change neurovascular status with distal keratotic lesion hallux second digit left with breakdown noted but no indications of proximal edema erythema drainage noted    Assessment:     Patient is stable with the left foot after having B cane amputation right    Plan:     Reviewed condition and debrided tissue and advised on the continuation of conservative care. Gave strict instructions of any further redness or other pathology were to occur to let us know immediately

## 2016-06-20 ENCOUNTER — Ambulatory Visit (INDEPENDENT_AMBULATORY_CARE_PROVIDER_SITE_OTHER): Payer: Medicare Other | Admitting: Internal Medicine

## 2016-06-20 ENCOUNTER — Encounter: Payer: Self-pay | Admitting: Internal Medicine

## 2016-06-20 VITALS — BP 118/68 | HR 76 | Ht 70.0 in | Wt 177.0 lb

## 2016-06-20 DIAGNOSIS — I482 Chronic atrial fibrillation: Secondary | ICD-10-CM

## 2016-06-20 DIAGNOSIS — I495 Sick sinus syndrome: Secondary | ICD-10-CM | POA: Diagnosis not present

## 2016-06-20 DIAGNOSIS — Z950 Presence of cardiac pacemaker: Secondary | ICD-10-CM | POA: Diagnosis not present

## 2016-06-20 DIAGNOSIS — I4821 Permanent atrial fibrillation: Secondary | ICD-10-CM

## 2016-06-20 LAB — CUP PACEART INCLINIC DEVICE CHECK
Battery Remaining Longevity: 127.2
Date Time Interrogation Session: 20170731153630
Implantable Lead Location: 753860
Lead Channel Pacing Threshold Amplitude: 0.5 V
Lead Channel Pacing Threshold Pulse Width: 0.4 ms
Lead Channel Sensing Intrinsic Amplitude: 10.3 mV
Lead Channel Setting Sensing Sensitivity: 2 mV
MDC IDC LEAD IMPLANT DT: 20120817
MDC IDC LEAD MODEL: 1948
MDC IDC MSMT BATTERY VOLTAGE: 2.95 V
MDC IDC MSMT LEADCHNL RV IMPEDANCE VALUE: 537.5 Ohm
MDC IDC MSMT LEADCHNL RV PACING THRESHOLD AMPLITUDE: 0.5 V
MDC IDC MSMT LEADCHNL RV PACING THRESHOLD PULSEWIDTH: 0.4 ms
MDC IDC PG SERIAL: 7244744
MDC IDC SET LEADCHNL RV PACING AMPLITUDE: 2 V
MDC IDC SET LEADCHNL RV PACING PULSEWIDTH: 0.4 ms
MDC IDC STAT BRADY RV PERCENT PACED: 21 %
Pulse Gen Model: 1210

## 2016-06-20 NOTE — Progress Notes (Signed)
Patient Care Team: [name], DO as PCP - General (Geriatric Medicine) Ricard Dillon, MD as Consulting Physician (Internal Medicine) Deboraha Sprang, MD as Consulting Physician (Cardiology) Thompson Grayer, MD as Consulting Physician (Cardiology) [name], MD as Consulting Physician (Orthopedic Surgery) Wallene Huh, DPM as Consulting Physician (Podiatry)   HPI  [name] is a 81 y.o. male Seen in followup for pacemaker implanted 2012   He has atrial fibrillation and had a stroke/TIA 22,014  4/15 Echo EF normal  He is doing relatively well without complaints of significant SOB    He si s./p  AKA 2015      Past Medical History:  Diagnosis Date  . Atrial fibrillation ([place])    permanent  . Backache, unspecified   . Cellulitis and abscess of leg, except foot   . CHF (congestive heart failure) ([place])   . Diverticulosis 05-2006   Dr. [name]   . Esophageal stricture   . Fatigue   . GERD (gastroesophageal reflux disease)   . Gout, unspecified   . Heart failure   . Hx of colonic polyps 05-2006   (Adenomatous)Dr. [name]  . Hx of radiation therapy 06/16/14- 07/18/14   mult squamous cell carcinomas of scalp  . Hypertension   . Long term (current) use of anticoagulants   . Overweight(278.02)   . Pacemaker- [facility] 02/20/2013  . Phlebitis and thrombophlebitis of superficial vessels of lower extremities   . Right bundle branch block   . Skin cancer    squamous cell carcinoma scalp  . Syncope and collapse   . Tachycardia-bradycardia ([place])    s/p PPM  . Type II or unspecified type diabetes mellitus without mention of complication, not stated as uncontrolled   . Unspecified venous (peripheral) insufficiency   . Unspecified venous (peripheral) insufficiency     Past Surgical History:  Procedure Laterality Date  . ABOVE KNEE LEG AMPUTATION Right 04/02/2014   DR DUDA  . AMPUTATION Right 01/20/2014   Procedure: AMPUTATION BELOW KNEE- right;  Surgeon: [name],  MD;  Location: [facility];  Service: Orthopedics;  Laterality: Right;  Right Below Knee Amputation  . AMPUTATION Right 02/14/2014   Procedure: AMPUTATION BELOW KNEE;  Surgeon: [name], MD;  Location: [place];  Service: Orthopedics;  Laterality: Right;  Right Below Knee Amputation Revision  . AMPUTATION Right 03/07/2014   Procedure: AMPUTATION BELOW KNEE;  Surgeon: [name], MD;  Location: [place];  Service: Orthopedics;  Laterality: Right;  Right Below Knee Amputation Revision  . AMPUTATION Right 03/18/2014   Procedure: revision of  AMPUTATION BELOW KNEE;  Surgeon: [name], MD;  Location: [place];  Service: Orthopedics;  Laterality: Right;  Revision Right Below Knee Amputation  . AMPUTATION Right 04/02/2014   Procedure: AMPUTATION ABOVE KNEE;  Surgeon: [name], MD;  Location: [place];  Service: Orthopedics;  Laterality: Right;  Right Above Knee Amputation  . EYE SURGERY    . I&D EXTREMITY Right 01/17/2014   Procedure: IRRIGATION AND DEBRIDEMENT RIGHT HEEL  WITH CULTURES AND BONE BIOPSY, placement of wound vac;  Surgeon: Theodoro Kos, DO;  Location: [facility];  Service: Plastics;  Laterality: Right;  . LAMINECTOMY    . LUMBAR FUSION    . PACEMAKER INSERTION  8/12   SJM by [name] for tachy/brady syndrome  . ROTATOR CUFF REPAIR    . ruptured rt rectus muscle    . SKIN BIOPSY     scalp  Current Outpatient Prescriptions  Medication Sig Dispense Refill  . apixaban (ELIQUIS) 5 MG TABS tablet Take 1 tablet (5 mg total) by mouth daily. 90 tablet 3  . B-D ULTRAFINE III SHORT PEN 31G X 8 MM MISC USE AS DIRECTED 100 each 12  . cephALEXin (KEFLEX) 500 MG capsule Take 1 capsule (500 mg total) by mouth 3 (three) times daily. 60 capsule 1  . Diclofenac Sodium (PENNSAID) 1.5 % SOLN Place on skin 2-3 times daily as needed. 150 mL 0  . digoxin (DIGOX) 0.125 MG tablet Take 1 tablet (125 mcg total) by mouth daily. 90 tablet 3  . diltiazem (CARDIZEM CD) 180 MG 24 hr capsule Take 1 capsule (180 mg  total) by mouth daily. 90 capsule 3  . furosemide (LASIX) 20 MG tablet Take 1 tablet (20 mg total) by mouth every other day. 45 tablet 3  . gabapentin (NEURONTIN) 100 MG capsule Take 1 capsule (100 mg total) by mouth 2 (two) times daily. 180 capsule 3  . glimepiride (AMARYL) 2 MG tablet Take 1 tablet (2 mg total) by mouth daily with breakfast. 90 tablet 3  . glucose blood (ACCU-CHEK AVIVA PLUS) test strip Use to test blood sugar three times daily. Dx E11.59 300 each 3  . Insulin Glargine (LANTUS SOLOSTAR) 100 UNIT/ML Solostar Pen INJECT 22 UNITS AT BEDTIME 15 mL 5  . insulin lispro (HUMALOG KWIKPEN) 100 UNIT/ML KiwkPen INJECT 8 UNITS BEFORE MEALS-HOLD FOR BLOOD SUGAR LESS THAN 100 15 mL 5  . lisinopril (PRINIVIL,ZESTRIL) 10 MG tablet Take 1 tablet (10 mg total) by mouth daily. 90 tablet 3  . nitroGLYCERIN (NITRODUR - DOSED IN MG/24 HR) 0.2 mg/hr patch Place 1 patch onto the skin daily.    . Nutritional Supplements (FEEDING SUPPLEMENT, GLUCERNA 1.2 CAL,) LIQD Take 237 mLs by mouth daily as needed. Reported on 05/25/2016    . omeprazole (PRILOSEC) 20 MG capsule TAKE (1) CAPSULE DAILY. 90 capsule 0  . Oxycodone HCl 10 MG TABS Take 1 tablet (10 mg total) by mouth every 8 (eight) hours as needed. For pain 90 tablet 0  . senna-docusate (SENNA-S) 8.6-50 MG per tablet Take 1 tablet by mouth daily as needed.      No current facility-administered medications for this visit.     Allergies  Allergen Reactions  . Morphine Rash and Other (See Comments)    Irritability also    Review of Systems negative except from HPI and PMH  Physical Exam BP 118/68   Pulse 76   Ht 5\' 10"  (1.778 m)   Wt 177 lb (80.3 kg)   SpO2 98%   BMI 25.40 kg/m  Well developed and nourished in no acute distress HENT normal Neck supple with JVP-flat Clear Irregular rate and rhythm, no murmurs or gallops Abd-soft with active BS No Clubbing cyanosis edema  S/p R AKA  Skin-warm and dry A & Oriented  Grossly normal sensory  and motor function  #CG  Atrial fib 76 RBBB -/13/40  Assessment and  Plan  Atrial fib  Renal insufficiency grade 3 ( Cr 1.44--5/15)  Pacemaker  The patient's device was interrogated.  The information was reviewed. No changes were made in the programming.    We'll discontinue digoxin

## 2016-06-20 NOTE — Patient Instructions (Signed)
Medication Instructions: - Your physician has recommended you make the following change in your medication:  1) Stop digoxin  Labwork: - none  Procedures/Testing: - none  Follow-Up: - Your physician wants you to follow-up in: 6 months with the [facility] 1 year with Dr. Caryl Comes. You will receive a reminder letter in the mail two months in advance. If you don't receive a letter, please call our office to schedule the follow-up appointment.  Any Additional Special Instructions Will Be Listed Below (If Applicable).     If you need a refill on your cardiac medications before your next appointment, please call your pharmacy.

## 2016-06-22 ENCOUNTER — Encounter: Payer: Self-pay | Admitting: Internal Medicine

## 2016-06-23 ENCOUNTER — Telehealth: Payer: Self-pay

## 2016-06-23 ENCOUNTER — Other Ambulatory Visit: Payer: Self-pay

## 2016-06-23 ENCOUNTER — Other Ambulatory Visit: Payer: Self-pay | Admitting: Internal Medicine

## 2016-06-23 DIAGNOSIS — M549 Dorsalgia, unspecified: Principal | ICD-10-CM

## 2016-06-23 DIAGNOSIS — G8929 Other chronic pain: Secondary | ICD-10-CM

## 2016-06-23 MED ORDER — OXYCODONE HCL 10 MG PO TABS: 10.0000 mg | ORAL_TABLET | Freq: Three times a day (TID) | ORAL | 0 refills | Status: DC | PRN

## 2016-06-23 NOTE — Telephone Encounter (Signed)
I call patient to let him know that his prescription for Oxycodone 10 mg tablets is ready to be picked up. Prescription was placed in filing cabinet at front desk.

## 2016-07-05 ENCOUNTER — Other Ambulatory Visit: Payer: Self-pay | Admitting: Nurse Practitioner

## 2016-07-14 ENCOUNTER — Ambulatory Visit: Payer: Medicare Other | Admitting: Internal Medicine

## 2016-07-18 ENCOUNTER — Ambulatory Visit (INDEPENDENT_AMBULATORY_CARE_PROVIDER_SITE_OTHER): Payer: Medicare Other | Admitting: Internal Medicine

## 2016-07-18 ENCOUNTER — Encounter: Payer: Self-pay | Admitting: Internal Medicine

## 2016-07-18 VITALS — BP 130/70 | HR 70 | Temp 98.3°F

## 2016-07-18 DIAGNOSIS — Z23 Encounter for immunization: Secondary | ICD-10-CM

## 2016-07-18 DIAGNOSIS — I10 Essential (primary) hypertension: Secondary | ICD-10-CM | POA: Diagnosis not present

## 2016-07-18 DIAGNOSIS — M549 Dorsalgia, unspecified: Secondary | ICD-10-CM

## 2016-07-18 DIAGNOSIS — G8929 Other chronic pain: Secondary | ICD-10-CM

## 2016-07-18 DIAGNOSIS — L97529 Non-pressure chronic ulcer of other part of left foot with unspecified severity: Secondary | ICD-10-CM | POA: Diagnosis not present

## 2016-07-18 DIAGNOSIS — E11621 Type 2 diabetes mellitus with foot ulcer: Secondary | ICD-10-CM | POA: Diagnosis not present

## 2016-07-18 DIAGNOSIS — E162 Hypoglycemia, unspecified: Secondary | ICD-10-CM | POA: Diagnosis not present

## 2016-07-18 LAB — CBC WITH DIFFERENTIAL/PLATELET
Basophils Absolute: 0 cells/uL (ref 0–200)
Basophils Relative: 0 %
Eosinophils Absolute: 162 cells/uL (ref 15–500)
Eosinophils Relative: 2 %
HCT: 34.4 % — ABNORMAL LOW (ref 38.5–50.0)
Hemoglobin: 11.4 g/dL — ABNORMAL LOW (ref 13.2–17.1)
Lymphocytes Relative: 20 %
Lymphs Abs: 1620 cells/uL (ref 850–3900)
MCH: 27.2 pg (ref 27.0–33.0)
MCHC: 33.1 g/dL (ref 32.0–36.0)
MCV: 82.1 fL (ref 80.0–100.0)
MPV: 10 fL (ref 7.5–12.5)
Monocytes Absolute: 405 cells/uL (ref 200–950)
Monocytes Relative: 5 %
Neutro Abs: 5913 cells/uL (ref 1500–7800)
Neutrophils Relative %: 73 %
Platelets: 202 10*3/uL (ref 140–400)
RBC: 4.19 MIL/uL — ABNORMAL LOW (ref 4.20–5.80)
RDW: 14.3 % (ref 11.0–15.0)
WBC: 8.1 10*3/uL (ref 3.8–10.8)

## 2016-07-18 LAB — BASIC METABOLIC PANEL WITH GFR
BUN: 24 mg/dL (ref 7–25)
CO2: 22 mmol/L (ref 20–31)
Calcium: 9.1 mg/dL (ref 8.6–10.3)
Chloride: 103 mmol/L (ref 98–110)
Creat: 1.55 mg/dL — ABNORMAL HIGH (ref 0.70–1.18)
GFR, Est African American: 49 mL/min — ABNORMAL LOW (ref >=60)
GFR, Est Non African American: 42 mL/min — ABNORMAL LOW (ref >=60)
Glucose, Bld: 162 mg/dL — ABNORMAL HIGH (ref 65–99)
Potassium: 4.9 mmol/L (ref 3.5–5.3)
Sodium: 136 mmol/L (ref 135–146)

## 2016-07-18 MED ORDER — OXYCODONE HCL 10 MG PO TABS: 10.0000 mg | ORAL_TABLET | Freq: Three times a day (TID) | ORAL | 0 refills | Status: DC | PRN

## 2016-07-18 NOTE — Patient Instructions (Signed)
Stop glimepiride (amaryl). Continue to check your glucose levels and call me back with your results.

## 2016-07-18 NOTE — Progress Notes (Signed)
Location:  [facility] clinic Provider:  Goodwin Kamphaus L. Mariea Clonts, D.O., C.M.D.  Code Status: DNR Goals of Care:  Advanced Directives 07/18/2016  Does patient have an advance directive? Yes  Type of Advance Directive Living will;Out of facility DNR (pink MOST or yellow form)  Does patient want to make changes to advanced directive? -  Copy of advanced directive(s) in chart? Yes  Pre-existing out of facility DNR order (yellow form or pink MOST form) Yellow form placed in chart (order not valid for inpatient use)     Chief Complaint  Patient presents with  . Medical Management of Chronic Issues    6 mth follow-up    HPI: Patient is a 81 y.o. male seen today for medical management of chronic diseases.   Colon Cancer- last colonscopy ~ 4 years ago. Did not recommend follow up Prostate Cancer- no increase in urination or changes in stream, no family hx of prostate cancer   6/9 visit with Dr. [name]:  "At this point we are sending him for vascular evaluation for consideration of ray resection first and second metatarsal to try to stop this process and hopefully prevent him from losing his leg. I did place him on cephalexin 500 mg 3 times a day to try to control any infection present"    Had ABIs 05/05/16. Saw Dr. Scot Dock 05/25/16:  "developed a small wound on his left heel in January 2016 but ultimately this healed. He set swelling developed wounds on the end of his left first and second toes which have been slow to heal and he is followed by Dr. Ila Mcgill. Given that he's had these wounds for some time without successful healing he was sent for evaluation for possible ray amputation of the first and second toes."  He felt he had adequate circulation to heal amputations of his first and second toes.  Saw Dr. Caryl Comes from cardiology.  Is off digoxin now.  Requested his flu shot.  He feels the toes are healing.  He's been on the abx about three months.  They had been looking terrible but finally had pink  tissue at his last appt with Dr. [name].  He doesn't want another amputation.  He needs the toes for balance for the little bit he does get up with his prosthesis.    Blood sugars have been very low.  He's had to skip his lantus sometimes for fear he'd be low overnight (if at night below 125).     Sleeps late after being up till 2-3am. Up at noon: 8/_0 _1 61 Then had breakfast and 191 today after a bowl of shredded wheat, 1/2 banana and blueberries with half and half.    Postprandial _2 _3 64  8/4 was his last night glucose after supper over 200  Has not fallen in over a year.  He's very careful.  Says slow and steady.   Knows lows--shakes and sweats.     Past Medical History:  Diagnosis Date  . Atrial fibrillation ([place])    permanent  . Backache, unspecified   . Cellulitis and abscess of leg, except foot   . CHF (congestive heart failure) ([place])   . Diverticulosis 05-2006   Dr. [name]   . Esophageal stricture   . Fatigue   . GERD (gastroesophageal  reflux disease)   . Gout, unspecified   . Heart failure   . Hx of colonic polyps 05-2006   (Adenomatous)Dr. [name]  . Hx of radiation therapy 06/16/14- 07/18/14   mult squamous cell carcinomas of scalp  . Hypertension   . Long term (current) use of anticoagulants   . Overweight(278.02)   . Pacemaker- [employer] 02/20/2013  . Phlebitis and thrombophlebitis of superficial vessels of lower extremities   . Right bundle branch block   . Skin cancer    squamous cell carcinoma scalp  . Syncope and collapse   . Tachycardia-bradycardia ([place])    s/p PPM  . Type II or unspecified type diabetes mellitus without mention of complication, not stated as uncontrolled   . Unspecified venous (peripheral) insufficiency   . Unspecified venous (peripheral) insufficiency     Past  Surgical History:  Procedure Laterality Date  . ABOVE KNEE LEG AMPUTATION Right 04/02/2014   DR DUDA  . AMPUTATION Right 01/20/2014   Procedure: AMPUTATION BELOW KNEE- right;  Surgeon: [name], MD;  Location: [place];  Service: Orthopedics;  Laterality: Right;  Right Below Knee Amputation  . AMPUTATION Right 02/14/2014   Procedure: AMPUTATION BELOW KNEE;  Surgeon: [name], MD;  Location: [place];  Service: Orthopedics;  Laterality: Right;  Right Below Knee Amputation Revision  . AMPUTATION Right 03/07/2014   Procedure: AMPUTATION BELOW KNEE;  Surgeon: [name], MD;  Location: [place];  Service: Orthopedics;  Laterality: Right;  Right Below Knee Amputation Revision  . AMPUTATION Right 03/18/2014   Procedure: revision of  AMPUTATION BELOW KNEE;  Surgeon: [name], MD;  Location: [place];  Service: Orthopedics;  Laterality: Right;  Revision Right Below Knee Amputation  . AMPUTATION Right 04/02/2014   Procedure: AMPUTATION ABOVE KNEE;  Surgeon: [name], MD;  Location: [place];  Service: Orthopedics;  Laterality: Right;  Right Above Knee Amputation  . EYE SURGERY    . I&D EXTREMITY Right 01/17/2014   Procedure: IRRIGATION AND DEBRIDEMENT RIGHT HEEL  WITH CULTURES AND BONE BIOPSY, placement of wound vac;  Surgeon: Theodoro Kos, DO;  Location: [place];  Service: Plastics;  Laterality: Right;  . LAMINECTOMY    . LUMBAR FUSION    . PACEMAKER INSERTION  8/12   SJM by [name] for tachy/brady syndrome  . ROTATOR CUFF REPAIR    . ruptured rt rectus muscle    . SKIN BIOPSY     scalp    Allergies  Allergen Reactions  . Morphine Rash and Other (See Comments)    Irritability also      Medication List       Accurate as of 07/18/16  4:04 PM. Always use your most recent med list.          apixaban 5 MG Tabs tablet Commonly known as:  ELIQUIS Take 1 tablet (5 mg total) by mouth daily.   B-D ULTRAFINE III SHORT PEN 31G X 8 MM Misc Generic drug:  Insulin Pen Needle USE AS  DIRECTED   cephALEXin 500 MG capsule Commonly known as:  KEFLEX Take 1 capsule (500 mg total) by mouth 3 (three) times daily.   Diclofenac Sodium 1.5 % Soln Commonly known as:  PENNSAID Place on skin 2-3 times daily as needed.   diltiazem 180 MG 24 hr capsule Commonly known as:  CARDIZEM CD Take 1 capsule (180 mg total) by mouth daily.   feeding supplement (GLUCERNA 1.2 CAL) Liqd Take 237 mLs by  mouth daily as needed. Reported on 05/25/2016   furosemide 20 MG tablet Commonly known as:  LASIX Take 1 tablet (20 mg total) by mouth every other day.   gabapentin 100 MG capsule Commonly known as:  NEURONTIN Take 1 capsule (100 mg total) by mouth 2 (two) times daily.   glimepiride 2 MG tablet Commonly known as:  AMARYL Take 1 tablet (2 mg total) by mouth daily with breakfast.   glucose blood test strip Commonly known as:  ACCU-CHEK AVIVA PLUS Use to test blood sugar three times daily. Dx E11.59   Insulin Glargine 100 UNIT/ML Solostar Pen Commonly known as:  LANTUS SOLOSTAR INJECT 22 UNITS AT BEDTIME   insulin lispro 100 UNIT/ML KiwkPen Commonly known as:  HUMALOG KWIKPEN INJECT 8 UNITS BEFORE MEALS-HOLD FOR BLOOD SUGAR LESS THAN 100   lisinopril 10 MG tablet Commonly known as:  PRINIVIL,ZESTRIL Take 1 tablet (10 mg total) by mouth daily.   nitroGLYCERIN 0.2 mg/hr patch Commonly known as:  NITRODUR - Dosed in mg/24 hr Place 1 patch onto the skin daily.   omeprazole 20 MG capsule Commonly known as:  PRILOSEC TAKE (1) CAPSULE DAILY.   Oxycodone HCl 10 MG Tabs Take 1 tablet (10 mg total) by mouth every 8 (eight) hours as needed. For pain   SENNA-S 8.6-50 MG tablet Generic drug:  senna-docusate Take 1 tablet by mouth daily as needed.       Review of Systems:  Review of Systems  Constitutional: Negative for chills, fever and malaise/fatigue.  Eyes: Negative for blurred vision.  Respiratory: Negative for cough and shortness of breath.   Cardiovascular: Negative for  chest pain and palpitations.  Gastrointestinal: Negative for abdominal pain.  Musculoskeletal: Negative for falls.  Skin:       Left first and second toes with erythema and dry thick scaly skin on the tips; ulceration on medial aspect of second toe, slight drainage on his sock--says he wears a sandal at home  Neurological: Positive for tingling, sensory change and headaches. Negative for weakness.  Endo/Heme/Allergies: Bruises/bleeds easily.  Psychiatric/Behavioral: Negative for depression and memory loss.    Health Maintenance  Topic Date Due  . TETANUS/TDAP  11/22/2011  . OPHTHALMOLOGY EXAM  10/22/2015  . FOOT EXAM  12/30/2015  . INFLUENZA VACCINE  06/21/2016  . HEMOGLOBIN A1C  07/10/2016  . ZOSTAVAX  Completed  . PNA vac Low Risk Adult  Completed    Physical Exam: There were no vitals filed for this visit. There is no height or weight on file to calculate BMI. Physical Exam  Constitutional: He is oriented to person, place, and time. He appears well-developed and well-nourished. No distress.  Cardiovascular:  irreg irreg  Pulmonary/Chest: Effort normal and breath sounds normal. No respiratory distress.  Abdominal: Soft. Bowel sounds are normal.  Musculoskeletal: Normal range of motion.  R AKA; wounds on left great toe and second toe; Left first and second toes with erythema and dry thick scaly skin on the tips; ulceration on medial aspect of second toe, slight drainage on his sock--says he wears a sandal at home   Neurological: He is alert and oriented to person, place, and time.  Skin:  See above about toe wounds  Psychiatric: He has a normal mood and affect.    Labs reviewed: Basic Metabolic Panel:  Recent Labs  01/11/16 1509  NA 134  K 5.1  CL 97  CO2 20  GLUCOSE 175*  BUN 23  CREATININE 1.65*  CALCIUM 8.6   Liver  Function Tests:  Recent Labs  01/11/16 1509  AST 11  ALT 10  ALKPHOS 113  BILITOT 0.4  PROT 6.5  ALBUMIN 3.6   No results for  input(s): LIPASE, AMYLASE in the last 8760 hours. No results for input(s): AMMONIA in the last 8760 hours. CBC:  Recent Labs  01/11/16 1509  WBC 9.1  NEUTROABS 5.7  HCT 36.6*  MCV 82  PLT 233   Lipid Panel:  Recent Labs  01/11/16 1509  CHOL 140  HDL 28*  LDLCALC 81  TRIG 157*  CHOLHDL 5.0   Lab Results  Component Value Date   HGBA1C 6.4 (H) 01/11/2016    Assessment/Plan 1. Chronic back pain - cont his chronic pain regimen which has been effective for him long term -he is likely dependent on this, but has not been escalating doses or causing problems worrisome for addictive tendencies - Oxycodone HCl 10 MG TABS; Take 1 tablet (10 mg total) by mouth every 8 (eight) hours as needed. For pain  Dispense: 90 tablet; Refill: 0  2. Need for immunization against influenza - Flu Vaccine QUAD 36+ mos PF IM (Fluarix & Fluzone Quad PF) was given  3. Diabetic ulcer of left foot associated with type 2 diabetes mellitus ([place]) -keep f/u at Dr. Nicole Cella and Dr. Mellody Drown  -continue on keflex -pt reports that podiatry felt there was an improvement which is why amputation was not scheduled though Dr. Scot Dock felt he could heal from an amputation if needed  4.  Hypoglycemia -advised to d/c glimepiride and continue to monitor his glucose--report results after 2 wks to the office--attn to me  Labs/tests ordered:   Orders Placed This Encounter  Procedures  . Flu Vaccine QUAD 36+ mos PF IM (Fluarix & Fluzone Quad PF)  . Hemoglobin A1c  . BMP with eGFR  . CBC with Differential/Platelets   Next appt:  10/18/2016 Janett Billow for med [employer] L. Karsyn Rochin, D.O. [facility] [address], [place] [address] Cell Phone (Mon-Fri 8am-5pm):  [phone] On Call:  [phone] & follow prompts after 5pm & weekends Office Phone:  [phone] Office Fax:  [phone]

## 2016-07-19 LAB — HEMOGLOBIN A1C
Hgb A1c MFr Bld: 6.2 % — ABNORMAL HIGH (ref <5.7)
Mean Plasma Glucose: 131 mg/dL

## 2016-07-21 ENCOUNTER — Encounter: Payer: Self-pay | Admitting: *Deleted

## 2016-07-22 ENCOUNTER — Other Ambulatory Visit: Payer: Self-pay | Admitting: Internal Medicine

## 2016-07-22 DIAGNOSIS — L97529 Non-pressure chronic ulcer of other part of left foot with unspecified severity: Principal | ICD-10-CM

## 2016-07-22 DIAGNOSIS — E11621 Type 2 diabetes mellitus with foot ulcer: Secondary | ICD-10-CM

## 2016-07-28 ENCOUNTER — Encounter: Payer: Self-pay | Admitting: Vascular Surgery

## 2016-08-01 ENCOUNTER — Encounter: Payer: Self-pay | Admitting: Podiatry

## 2016-08-01 ENCOUNTER — Ambulatory Visit (INDEPENDENT_AMBULATORY_CARE_PROVIDER_SITE_OTHER): Payer: Medicare Other | Admitting: Podiatry

## 2016-08-01 ENCOUNTER — Telehealth: Payer: Self-pay | Admitting: *Deleted

## 2016-08-01 DIAGNOSIS — I739 Peripheral vascular disease, unspecified: Secondary | ICD-10-CM

## 2016-08-01 DIAGNOSIS — L89891 Pressure ulcer of other site, stage 1: Secondary | ICD-10-CM | POA: Diagnosis not present

## 2016-08-01 DIAGNOSIS — M79676 Pain in unspecified toe(s): Secondary | ICD-10-CM | POA: Diagnosis not present

## 2016-08-01 DIAGNOSIS — L97502 Non-pressure chronic ulcer of other part of unspecified foot with fat layer exposed: Secondary | ICD-10-CM

## 2016-08-01 DIAGNOSIS — B351 Tinea unguium: Secondary | ICD-10-CM | POA: Diagnosis not present

## 2016-08-01 MED ORDER — CEPHALEXIN 500 MG PO CAPS: 500.0000 mg | ORAL_CAPSULE | Freq: Three times a day (TID) | ORAL | 2 refills | Status: DC

## 2016-08-01 NOTE — Telephone Encounter (Signed)
Received fax from [facility] [phone] Fax: [phone]. Eliquis was APPROVED 11/22/15 to 11/20/16. Patient ID: [id number] Reference #: [id number]

## 2016-08-03 ENCOUNTER — Encounter: Payer: Self-pay | Admitting: Vascular Surgery

## 2016-08-03 ENCOUNTER — Ambulatory Visit (INDEPENDENT_AMBULATORY_CARE_PROVIDER_SITE_OTHER): Payer: Medicare Other | Admitting: Vascular Surgery

## 2016-08-03 VITALS — BP 128/61 | HR 80 | Temp 99.4°F | Resp 16 | Ht 70.0 in | Wt 177.0 lb

## 2016-08-03 DIAGNOSIS — I7025 Atherosclerosis of native arteries of other extremities with ulceration: Secondary | ICD-10-CM

## 2016-08-03 NOTE — Progress Notes (Signed)
Patient name: [name] MRN: [id number] DOB: January 21, 1935 Sex: male  REASON FOR VISIT: Follow up of left second toe wound.  HPI: [name] is a 81 y.o. male Who I last saw on 05/25/2016. He is status post previous right above-the-knee amputation for diabetic foot infection. He developed a small wound on his left heel in January 2016 but this ultimately healed. He subsequently developed a wound on his left first, and second toes.  When I saw him on 05/25/2016, he had a palpable femoral pulse popliteal pulse and dorsalis pedis pulse on the left. He had a biphasic dorsalis pedis signal with the Doppler and a monophasic posterior tibial signal with the Doppler. ABI was 90%. Toe pressure could not be obtained. My feeling was that he likely had adequate circulation to heal a first and second toe amputation. However he was reluctant to proceed at that time. Therefore he was to continue outpatient care and I'll arrange for a follow up visit.   Since I saw him last, he's been taking excellent care of his foot and Dr. [name] has been doing aggressive outpatient care on his toe wounds. The wounds are improving. He denies fever or chills.  Current Outpatient Prescriptions  Medication Sig Dispense Refill  . apixaban (ELIQUIS) 5 MG TABS tablet Take 1 tablet (5 mg total) by mouth daily. 90 tablet 3  . B-D ULTRAFINE III SHORT PEN 31G X 8 MM MISC USE AS DIRECTED 100 each 3  . cephALEXin (KEFLEX) 500 MG capsule Take 1 capsule (500 mg total) by mouth 3 (three) times daily. 60 capsule 1  . cephALEXin (KEFLEX) 500 MG capsule Take 1 capsule (500 mg total) by mouth 3 (three) times daily. 90 capsule 2  . Diclofenac Sodium (PENNSAID) 1.5 % SOLN Place on skin 2-3 times daily as needed. 150 mL 0  . diltiazem (CARDIZEM CD) 180 MG 24 hr capsule Take 1 capsule (180 mg total) by mouth daily. 90 capsule 3  . furosemide (LASIX) 20 MG tablet Take 1 tablet (20 mg total) by mouth every other day. 45 tablet 3  .  gabapentin (NEURONTIN) 100 MG capsule Take 1 capsule (100 mg total) by mouth 2 (two) times daily. 180 capsule 3  . glucose blood (ACCU-CHEK AVIVA PLUS) test strip Use to test blood sugar three times daily. Dx E11.59 300 each 3  . Insulin Glargine (LANTUS SOLOSTAR) 100 UNIT/ML Solostar Pen INJECT 22 UNITS AT BEDTIME (Patient taking differently: at bedtime. INJECT 22 UNITS AT BEDTIME) 15 mL 5  . insulin lispro (HUMALOG KWIKPEN) 100 UNIT/ML KiwkPen INJECT 8 UNITS BEFORE MEALS-HOLD FOR BLOOD SUGAR LESS THAN 100 (Patient taking differently: daily. INJECT 8 UNITS BEFORE MEALS-HOLD FOR BLOOD SUGAR LESS THAN 100) 15 mL 5  . lisinopril (PRINIVIL,ZESTRIL) 10 MG tablet Take 1 tablet (10 mg total) by mouth daily. 90 tablet 3  . Nutritional Supplements (FEEDING SUPPLEMENT, GLUCERNA 1.2 CAL,) LIQD Take 237 mLs by mouth daily as needed. Reported on 05/25/2016    . omeprazole (PRILOSEC) 20 MG capsule TAKE (1) CAPSULE DAILY. 90 capsule 0  . Oxycodone HCl 10 MG TABS Take 1 tablet (10 mg total) by mouth every 8 (eight) hours as needed. For pain 90 tablet 0  . senna-docusate (SENNA-S) 8.6-50 MG per tablet Take 1 tablet by mouth daily as needed.      No current facility-administered medications for this visit.     REVIEW OF SYSTEMS:  [X]  denotes positive finding, [ ]  denotes negative finding Cardiac  Comments:  Chest pain or chest pressure:    Shortness of breath upon exertion:    Short of breath when lying flat:    Irregular heart rhythm:    Constitutional    Fever or chills:      PHYSICAL EXAM: Vitals:   08/03/16 1441  BP: 128/61  Pulse: 80  Resp: 16  Temp: 99.4 F (37.4 C)  TempSrc: Oral  SpO2: 100%  Weight: 177 lb (80.3 kg)  Height: 5\' 10"  (1.778 m)   GENERAL: The patient is a well-nourished male, in no acute distress. The vital signs are documented above. CARDIOVASCULAR: There is a regular rate and rhythm. PULMONARY: There is good air exchange bilaterally without wheezing or rales. He has a  palpable left dorsalis pedis pulse. The wounds on his left first and second toes are improving with good granulation tissue.  MEDICAL ISSUES:  PERIPHERAL VASCULAR DISEASE WITH WOUNDS ON LEFT FOOT: The wounds on his left first and second toes continue to improve. He has a palpable dorsalis pedis pulse. I think he has adequate circulation to heal these. Continue his follow up with Dr. [name]. If the wounds stop making progress or he has any concerns she knows to call. Otherwise I will plan on seeing him back as needed.  Deitra Mayo [facility] [phone]

## 2016-08-03 NOTE — Progress Notes (Signed)
Subjective:     Patient ID: [name], male   DOB: 10/31/1935, 81 y.o.   MRN: [id number]  HPI patient states I seem to be doing a little better and I taking my antibiotics and I feel like the tissues improving   Review of Systems     Objective:   Physical Exam High risk vascular diabetic patient has lost right leg to amputation and left hallux and second toe have significant distal crusted tissue that is localized with no current proximal edema erythema drainage. Patient is noted to have nail disease 1-5 left foot with incurvation of the beds    Assessment:     High risk diabetic with mycotic nail infections and lesion formations with moderate ulceration    Plan:     Debridement of tissue 1-5 left and debridement of nailbeds 1-5 left with no iatrogenic bleeding and continue cephalexin 500 mg 3 times a day and gave strict instructions of any changes were to occur to let us know immediately and if not to reappoint 6 weeks for routine care. Applied Iodosorb to ulcerated tissue and sterile dressings and instructed on home soaks and medication

## 2016-08-04 ENCOUNTER — Telehealth: Payer: Self-pay

## 2016-08-04 MED ORDER — INSULIN LISPRO 100 UNIT/ML (KWIKPEN): PEN_INJECTOR | SUBCUTANEOUS | 5 refills | Status: DC

## 2016-08-04 NOTE — Telephone Encounter (Signed)
BS is running very low for example 1 reading was 105, did not take insulin until 4:15 when BS was 148. At 6 pm BS dropped to 60.   The day before 111 at 9:45 pm, at 4:15 BS was 187, patient had 8 units of Humalog then at 7:15 it dropped to 76.  Patient gets shaky and thinks his insulin needs adjusting downwards. Please advise

## 2016-08-04 NOTE — Telephone Encounter (Signed)
Let's decrease his insulin to 6 units of humalog with meals and hold for sugars less than 100.   He should continue to monitor.  If his sugars are still low, I will decrease his long acting also.

## 2016-08-04 NOTE — Telephone Encounter (Signed)
Patient notified and agreed. Medication list updated.  

## 2016-08-30 ENCOUNTER — Other Ambulatory Visit: Payer: Self-pay | Admitting: *Deleted

## 2016-08-30 MED ORDER — OXYCODONE HCL 10 MG PO TABS: 10.0000 mg | ORAL_TABLET | Freq: Three times a day (TID) | ORAL | 0 refills | Status: DC | PRN

## 2016-08-30 NOTE — Telephone Encounter (Signed)
Patient requested and will pick up 

## 2016-09-12 ENCOUNTER — Ambulatory Visit (INDEPENDENT_AMBULATORY_CARE_PROVIDER_SITE_OTHER): Payer: Medicare Other | Admitting: Podiatry

## 2016-09-12 ENCOUNTER — Encounter: Payer: Self-pay | Admitting: Podiatry

## 2016-09-12 DIAGNOSIS — Q828 Other specified congenital malformations of skin: Secondary | ICD-10-CM | POA: Diagnosis not present

## 2016-09-12 DIAGNOSIS — L97502 Non-pressure chronic ulcer of other part of unspecified foot with fat layer exposed: Secondary | ICD-10-CM

## 2016-09-12 DIAGNOSIS — E11621 Type 2 diabetes mellitus with foot ulcer: Secondary | ICD-10-CM

## 2016-09-12 DIAGNOSIS — L89891 Pressure ulcer of other site, stage 1: Secondary | ICD-10-CM | POA: Diagnosis not present

## 2016-09-12 DIAGNOSIS — I739 Peripheral vascular disease, unspecified: Secondary | ICD-10-CM

## 2016-09-12 DIAGNOSIS — E0800 Diabetes mellitus due to underlying condition with hyperosmolarity without nonketotic hyperglycemic-hyperosmolar coma (NKHHC): Secondary | ICD-10-CM

## 2016-09-12 DIAGNOSIS — L97401 Non-pressure chronic ulcer of unspecified heel and midfoot limited to breakdown of skin: Secondary | ICD-10-CM

## 2016-09-12 DIAGNOSIS — E1140 Type 2 diabetes mellitus with diabetic neuropathy, unspecified: Secondary | ICD-10-CM

## 2016-09-14 NOTE — Progress Notes (Signed)
Subjective:     Patient ID: [name], male   DOB: 1935-07-18, 81 y.o.   MRN: [id number]  HPI patient states his toes are doing pretty well on his left foot   Review of Systems     Objective:   Physical Exam Chronic breakdown of tissue distal hallux second toe left with a lesion on the left lateral heel that is not broken down with history of loss in his right lower leg due to vascular disease and diabetes    Assessment:     At risk patient who currently is relatively stable    Plan:     Debrided the tissue today which was localized on both hallux and second digit left applied sterile dressing and instructed on continued local wound care and debrided lesion on the left fifth heel and patient will be seen back to recheck 6 weeks or earlier if any changes should occur

## 2016-10-05 ENCOUNTER — Other Ambulatory Visit: Payer: Self-pay | Admitting: *Deleted

## 2016-10-05 MED ORDER — OXYCODONE HCL 10 MG PO TABS: 10.0000 mg | ORAL_TABLET | Freq: Three times a day (TID) | ORAL | 0 refills | Status: DC | PRN

## 2016-10-05 NOTE — Telephone Encounter (Signed)
Patient requested and will pick up 

## 2016-10-18 ENCOUNTER — Ambulatory Visit: Payer: Medicare Other | Admitting: Nurse Practitioner

## 2016-10-20 ENCOUNTER — Ambulatory Visit (INDEPENDENT_AMBULATORY_CARE_PROVIDER_SITE_OTHER): Payer: Medicare Other | Admitting: Nurse Practitioner

## 2016-10-20 ENCOUNTER — Encounter: Payer: Self-pay | Admitting: Nurse Practitioner

## 2016-10-20 ENCOUNTER — Other Ambulatory Visit: Payer: Self-pay | Admitting: Internal Medicine

## 2016-10-20 VITALS — BP 120/68 | HR 64 | Temp 98.4°F | Resp 17

## 2016-10-20 DIAGNOSIS — L853 Xerosis cutis: Secondary | ICD-10-CM | POA: Diagnosis not present

## 2016-10-20 DIAGNOSIS — M25562 Pain in left knee: Secondary | ICD-10-CM | POA: Diagnosis not present

## 2016-10-20 DIAGNOSIS — K5900 Constipation, unspecified: Secondary | ICD-10-CM

## 2016-10-20 DIAGNOSIS — L97522 Non-pressure chronic ulcer of other part of left foot with fat layer exposed: Secondary | ICD-10-CM | POA: Diagnosis not present

## 2016-10-20 DIAGNOSIS — I10 Essential (primary) hypertension: Secondary | ICD-10-CM

## 2016-10-20 DIAGNOSIS — E1151 Type 2 diabetes mellitus with diabetic peripheral angiopathy without gangrene: Secondary | ICD-10-CM

## 2016-10-20 DIAGNOSIS — D649 Anemia, unspecified: Secondary | ICD-10-CM | POA: Diagnosis not present

## 2016-10-20 DIAGNOSIS — E11621 Type 2 diabetes mellitus with foot ulcer: Secondary | ICD-10-CM | POA: Diagnosis not present

## 2016-10-20 LAB — CBC WITH DIFFERENTIAL/PLATELET
BASOS PCT: 0 %
Basophils Absolute: 0 cells/uL (ref 0–200)
EOS PCT: 3 %
Eosinophils Absolute: 234 cells/uL (ref 15–500)
HCT: 34.7 % — ABNORMAL LOW (ref 38.5–50.0)
Hemoglobin: 11.3 g/dL — ABNORMAL LOW (ref 13.2–17.1)
LYMPHS PCT: 18 %
Lymphs Abs: 1404 cells/uL (ref 850–3900)
MCH: 27 pg (ref 27.0–33.0)
MCHC: 32.6 g/dL (ref 32.0–36.0)
MCV: 82.8 fL (ref 80.0–100.0)
MONO ABS: 390 {cells}/uL (ref 200–950)
MONOS PCT: 5 %
MPV: 10.7 fL (ref 7.5–12.5)
NEUTROS PCT: 74 %
Neutro Abs: 5772 cells/uL (ref 1500–7800)
PLATELETS: 206 10*3/uL (ref 140–400)
RBC: 4.19 MIL/uL — AB (ref 4.20–5.80)
RDW: 15 % (ref 11.0–15.0)
WBC: 7.8 10*3/uL (ref 3.8–10.8)

## 2016-10-20 LAB — BASIC METABOLIC PANEL WITH GFR
BUN: 26 mg/dL — AB (ref 7–25)
CALCIUM: 9 mg/dL (ref 8.6–10.3)
CHLORIDE: 101 mmol/L (ref 98–110)
CO2: 27 mmol/L (ref 20–31)
CREATININE: 1.63 mg/dL — AB (ref 0.70–1.18)
GFR, EST AFRICAN AMERICAN: 46 mL/min — AB (ref >=60)
GFR, Est Non African American: 39 mL/min — ABNORMAL LOW (ref >=60)
Glucose, Bld: 296 mg/dL — ABNORMAL HIGH (ref 65–99)
Potassium: 4.7 mmol/L (ref 3.5–5.3)
SODIUM: 135 mmol/L (ref 135–146)

## 2016-10-20 MED ORDER — TRIAMCINOLONE ACETONIDE 0.1 % EX CREA: 1.0000 "application " | TOPICAL_CREAM | Freq: Two times a day (BID) | CUTANEOUS | 0 refills | Status: DC

## 2016-10-20 NOTE — Progress Notes (Signed)
Careteam: Patient Care Team: [name], DO as PCP - General (Geriatric Medicine) Ricard Dillon, MD as Consulting Physician (Internal Medicine) Deboraha Sprang, MD as Consulting Physician (Cardiology) Thompson Grayer, MD as Consulting Physician (Cardiology) [name], MD as Consulting Physician (Orthopedic Surgery) Wallene Huh, DPM as Consulting Physician (Podiatry)  Advanced Directive information Does Patient Have a Medical Advance Directive?: Yes, Type of Advance Directive: Living will;Out of facility DNR (pink MOST or yellow form)  Allergies  Allergen Reactions  . Morphine Rash and Other (See Comments)    Irritability also    Chief Complaint  Patient presents with  . Medical Management of Chronic Issues    3 month follow up     HPI: Patient is a 81 y.o. male seen in the office today for a routine follow-up.  His Humalog was decreased to 6 units with meals and to hold for blood sugar less than 100 on 9/14 by Dr. Mariea Clonts. The patient checks his blood sugar between 2-4 times per day and gets readings that range from 70-283 in the month of November. He did have one reading that was 355 but had ate some cake that day with company. A majority of the fasting blood sugar readings are < 100. The patient does not take any Humalog if his blood sugar is < 150. He self adjusts Humalog and Lantus based on blood sugar readings. Does not always take Lantus. He reports he has figured out that he drops 6 points in blood sugar for every unit of Lantus and 18 points in blood sugar for every unit of Humalog.   The hallus, second digit on left foot, and left fifth heel debrided 10/23 by podiatrist. To follow-up on 12/4. Patient reports the wounds are healing with no erythema or odor. The wounds do have a serous drainage. He normally wears an open diabetic sandal with a sock. He reports he goes through a lot of socks because he changes it as it gets drainage on it throughout the day. He does  not apply any dressing. Soaks his foot in dial soap and warm water for 30-45 minutes twice a week. On 9/13 he was evaluted by vascular to ensure adequate blood flow to heal wounds.   Prostate cancer - no increase in urination or change in stream.   Concerned about the left knee. He reports it bothers him at times. This just started within the past 2 weeks. Describes it as a feeling of something pushing back like he shouldn't be using it. Treatments tried: lidocaine gel and an OTC cream for pain - both of which were effective. He reports he is happy that it relieves the pain but is worried he is just covering up the real problem. He is afraid of something happening to this knee since he already has an AKA of the right leg.   Review of Systems:  Review of Systems  Constitutional: Negative for activity change, appetite change, chills and fever.  HENT: Negative for congestion.   Eyes:       Glasses  Respiratory: Negative for cough and shortness of breath.   Cardiovascular: Negative for chest pain, palpitations and leg swelling.  Gastrointestinal: Positive for constipation. Negative for abdominal pain and diarrhea.  Genitourinary: Negative for difficulty urinating, dysuria and frequency.  Musculoskeletal: Positive for back pain. Negative for myalgias.       Left knee pain  Skin: Positive for wound. Negative for rash.  Ulcers left great and second toes - followed by podiatry   Neurological: Negative for weakness.       Phantom pain in right leg  Psychiatric/Behavioral: The patient is not nervous/anxious.     Past Medical History:  Diagnosis Date  . Atrial fibrillation ([place])    permanent  . Backache, unspecified   . Cellulitis and abscess of leg, except foot   . CHF (congestive heart failure) ([place])   . Diverticulosis 05-2006   Dr. [name]   . Esophageal stricture   . Fatigue   . GERD (gastroesophageal reflux disease)   . Gout, unspecified   . Heart failure   . Hx of colonic  polyps 05-2006   (Adenomatous)Dr. [name]  . Hx of radiation therapy 06/16/14- 07/18/14   mult squamous cell carcinomas of scalp  . Hypertension   . Long term (current) use of anticoagulants   . Overweight(278.02)   . Pacemaker- [place] 02/20/2013  . Phlebitis and thrombophlebitis of superficial vessels of lower extremities   . Right bundle branch block   . Skin cancer    squamous cell carcinoma scalp  . Syncope and collapse   . 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 Type II or unspecified type diabetes mellitus without mention of complication, not stated as uncontrolled   . Unspecified venous (peripheral) insufficiency   . Unspecified venous (peripheral) insufficiency    Past Surgical History:  Procedure Laterality Date  . ABOVE KNEE LEG AMPUTATION Right 04/02/2014   DR DUDA  . AMPUTATION Right 01/20/2014   Procedure: AMPUTATION BELOW KNEE- right;  Surgeon: [name], MD;  Location: [place];  Service: Orthopedics;  Laterality: Right;  Right Below Knee Amputation  . AMPUTATION Right 02/14/2014   Procedure: AMPUTATION BELOW KNEE;  Surgeon: [name], MD;  Location: [place];  Service: Orthopedics;  Laterality: Right;  Right Below Knee Amputation Revision  . AMPUTATION Right 03/07/2014   Procedure: AMPUTATION BELOW KNEE;  Surgeon: [name], MD;  Location: [facility];  Service: Orthopedics;  Laterality: Right;  Right Below Knee Amputation Revision  . AMPUTATION Right 03/18/2014   Procedure: revision of  AMPUTATION BELOW KNEE;  Surgeon: [name], MD;  Location: [place];  Service: Orthopedics;  Laterality: Right;  Revision Right Below Knee Amputation  . AMPUTATION Right 04/02/2014   Procedure: AMPUTATION ABOVE KNEE;  Surgeon: [name], MD;  Location: [place];  Service: Orthopedics;  Laterality: Right;  Right Above Knee Amputation  . EYE SURGERY    . I&D EXTREMITY Right 01/17/2014   Procedure: IRRIGATION AND DEBRIDEMENT RIGHT HEEL  WITH CULTURES AND BONE BIOPSY, placement of wound vac;   Surgeon: Theodoro Kos, DO;  Location: [place];  Service: Plastics;  Laterality: Right;  . LAMINECTOMY    . LUMBAR FUSION    . PACEMAKER INSERTION  8/12   SJM by [name] for tachy/brady syndrome  . ROTATOR CUFF REPAIR    . ruptured rt rectus muscle    . SKIN BIOPSY     scalp   Social History:   reports that he has quit smoking. He has never used smokeless tobacco. He reports that he does not drink alcohol or use drugs.  Family History  Problem Relation Age of Onset  . Pneumonia Mother   . Heart disease Father   . Diabetes Father   . Colon cancer Neg Hx     Medications: Patient's Medications  New Prescriptions   No medications on file  Previous Medications   APIXABAN (ELIQUIS) 5 MG TABS TABLET    Take 1 tablet (5 mg total) by mouth daily.   B-D ULTRAFINE III SHORT PEN 31G X 8 MM MISC    USE AS DIRECTED   CEPHALEXIN (KEFLEX) 500 MG CAPSULE    Take 1 capsule (500 mg total) by mouth 3 (three) times daily.   DILTIAZEM (CARDIZEM CD) 180 MG 24 HR CAPSULE    Take 1 capsule (180 mg total) by mouth daily.   FUROSEMIDE (LASIX) 20 MG TABLET    Take 1 tablet (20 mg total) by mouth every other day.   GABAPENTIN (NEURONTIN) 100 MG CAPSULE    Take 1 capsule (100 mg total) by mouth 2 (two) times daily.   GLUCOSE BLOOD (ACCU-CHEK AVIVA PLUS) TEST STRIP    Use to test blood sugar three times daily. Dx E11.59   INSULIN GLARGINE (LANTUS SOLOSTAR) 100 UNIT/ML SOLOSTAR PEN    INJECT 22 UNITS AT BEDTIME   INSULIN LISPRO (HUMALOG KWIKPEN) 100 UNIT/ML KIWKPEN    INJECT 6 UNITS BEFORE MEALS-HOLD FOR BLOOD SUGAR LESS THAN 100   LISINOPRIL (PRINIVIL,ZESTRIL) 10 MG TABLET    Take 1 tablet (10 mg total) by mouth daily.   NUTRITIONAL SUPPLEMENTS (FEEDING SUPPLEMENT, GLUCERNA 1.2 CAL,) LIQD    Take 237 mLs by mouth daily as needed. Reported on 05/25/2016   OMEPRAZOLE (PRILOSEC) 20 MG CAPSULE    TAKE (1) CAPSULE DAILY.   OXYCODONE HCL 10 MG TABS    Take 1 tablet (10 mg total) by mouth every 8 (eight) hours as  needed. For pain   SENNA-DOCUSATE (SENNA-S) 8.6-50 MG PER TABLET    Take 1 tablet by mouth daily as needed.   Modified Medications   No medications on file  Discontinued Medications   DICLOFENAC SODIUM (PENNSAID) 1.5 % SOLN    Place on skin 2-3 times daily as needed.     Physical Exam:  Vitals:   10/20/16 1430  BP: 120/68  Pulse: 64  Resp: 17  Temp: 98.4 F (36.9 C)  TempSrc: Oral  SpO2: 98%   There is no height or weight on file to calculate BMI.  Physical Exam  Constitutional: He is oriented to person, place, and time. He appears well-developed and well-nourished. No distress.  HENT:  Head: Normocephalic.  Eyes: Pupils are equal, round, and reactive to light.  Neck: Normal range of motion. Neck supple. No thyromegaly present.  Cardiovascular: Intact distal pulses.  An irregularly irregular rhythm present.  Pulmonary/Chest: Effort normal and breath sounds normal. No respiratory distress.  Abdominal: Soft. Bowel sounds are normal.  Musculoskeletal: Normal range of motion.  R AKA. Wounds on left great toe and second toe with slough and surrounding blanchable erythema. Some drainage on his sock. No foul odor.  Full ROM of left knee with crepitus.  Neurological: He is alert and oriented to person, place, and time.  Skin: Skin is warm.  Left anterior shin with thick scaly skin. Mild erythema below the thick scaly skin.   Psychiatric: He has a normal mood and affect. His speech is normal and behavior is normal. Judgment and thought content normal.    Labs reviewed: Basic Metabolic Panel:  Recent Labs  01/11/16 1509 07/18/16 1650  NA 134 136  K 5.1 4.9  CL 97 103  CO2 20 22  GLUCOSE 175* 162*  BUN 23 24  CREATININE 1.65* 1.55*  CALCIUM 8.6 9.1   Liver Function Tests:  Recent Labs  01/11/16 1509  AST 11  ALT 10  ALKPHOS 113  BILITOT 0.4  PROT 6.5  ALBUMIN 3.6   No results for input(s): LIPASE, AMYLASE in the last 8760 hours. No results for input(s):  AMMONIA in the last 8760 hours. CBC:  Recent Labs  01/11/16 1509 07/18/16 1650  WBC 9.1 8.1  NEUTROABS 5.7 5,913  HGB  --  11.4*  HCT 36.6* 34.4*  MCV 82 82.1  PLT 233 202   Lipid Panel:  Recent Labs  01/11/16 1509  CHOL 140  HDL 28*  LDLCALC 81  TRIG 157*  CHOLHDL 5.0   TSH: No results for input(s): TSH in the last 8760 hours. A1C: Lab Results  Component Value Date   HGBA1C 6.2 (H) 07/18/2016     Assessment/Plan 1. Diabetic ulcer of toe of left foot associated with type 2 diabetes mellitus, with fat layer exposed ([place]) - Wounds debrided on 10/23 - to follow-up with podiatrist on 12/4.  [facility] and surrounding erythema present, no foul odor, drainage present on sock. - cont wound care per podiatrist who is managing wound.   2. Essential hypertension - Controlled. BP today 120/68. - Continue cardizem and lisinopril. - BMP with eGFR  3. Constipation, unspecified constipation type - May take Miralax 17 gm daily. Advised that if he has loose stool to decrease to every other day.    4. Type II diabetes mellitus with peripheral circulatory disorder (HCC) - Controlled. Last HgbA1c 6.2 on 8/28. - Patient is self adjusting his Humalog and Lantus. He has figured out how many points in blood sugar he drops per unit of insulin and adjusts based on that.  - Hemoglobin A1c  5. Acute pain of left knee - Crepitus and pain present in left knee. Using lidocaine gel with good results.  - Ambulatory referral to [facility] for PT on knee.   6. Dry skin dermatitis - Thick scaly skin to left anterior shin  - triamcinolone cream (KENALOG) 0.1 %; Apply 1 application topically 2 (two) times daily.  Dispense: 30 g; Refill: 0 until clears (approximately 1-2 weeks). Do not apply to any open skin. Only apply to thick skin. - Once triamcinolone has been completed, may go back to the Gold Bond Diabetic lotion.   7. Anemia, unspecified type - Hgb 11.4 on 8/28 which was decreased from  12.2 on 2/20 - CBC with Differential/Platelets   Tobby Fawcett K. Harle Battiest  [facility] [phone] 8 am - 5 pm) [phone] (after hours)

## 2016-10-20 NOTE — Patient Instructions (Addendum)
Miralax 1 capful daily for constipation. If you start going too much, change it to every other day.   Triamcinolone cream to left leg until it clears, then go back to the Gold Bond Diabetic lotion. Do not apply to any open skin. Only apply to thick skin.  A referral has been placed for home health physical therapy.   We will call you if any changes need to be made based on your lab work.   Return for a follow-up in 4 months with Dr Mariea Clonts

## 2016-10-21 LAB — HEMOGLOBIN A1C
HEMOGLOBIN A1C: 6.5 % — AB (ref <5.7)
MEAN PLASMA GLUCOSE: 140 mg/dL

## 2016-10-24 ENCOUNTER — Ambulatory Visit (INDEPENDENT_AMBULATORY_CARE_PROVIDER_SITE_OTHER): Payer: Medicare Other | Admitting: Podiatry

## 2016-10-24 ENCOUNTER — Encounter: Payer: Self-pay | Admitting: Podiatry

## 2016-10-24 VITALS — Temp 98.5°F

## 2016-10-24 DIAGNOSIS — I739 Peripheral vascular disease, unspecified: Secondary | ICD-10-CM

## 2016-10-24 DIAGNOSIS — L97502 Non-pressure chronic ulcer of other part of unspecified foot with fat layer exposed: Secondary | ICD-10-CM

## 2016-10-26 ENCOUNTER — Telehealth: Payer: Self-pay | Admitting: *Deleted

## 2016-10-26 NOTE — Progress Notes (Signed)
Subjective:     Patient ID: [name], male   DOB: 11-21-1935, 81 y.o.   MRN: [id number]  HPI patient presents stating I'm doing well with my ulcerations   Review of Systems     Objective:   Physical Exam Neurovascular status unchanged with be can't be taken right with chronic ulcerations left hallux second digit which a been kept under control with debridement flushing and local wound care    Assessment:     Chronic lesion formation which are not currently draining and looked relatively healthy    Plan:     H&P explained the patient that amputation is always a possibility and debrided lesions today flushing the areas and applying Iodosorb with sterile dressings and continue home care. Patient be seen back for regular routine care or unless any changes were to occur when he will be seen immediately

## 2016-10-26 NOTE — Telephone Encounter (Signed)
Kesa with [facility] called and stated that they will be going out to see patient tomorrow to start services.

## 2016-10-31 ENCOUNTER — Telehealth: Payer: Self-pay | Admitting: *Deleted

## 2016-10-31 DIAGNOSIS — M25562 Pain in left knee: Principal | ICD-10-CM

## 2016-10-31 DIAGNOSIS — G8929 Other chronic pain: Secondary | ICD-10-CM

## 2016-10-31 NOTE — Telephone Encounter (Signed)
Okay to refer to another home health agency. Can he tell you what this is regarding to see if maybe it is something you can help him with, if not I will give him a call.

## 2016-10-31 NOTE — Telephone Encounter (Signed)
Patient called and stated that he needs another [place] because the one that came out to his home didn't want to "deal" with him because he is ambulatory. Patient is requesting [facility] or [facility]. Patient is also requesting to speak with you about personally. He stated he would like for you to call him. Please Advise.

## 2016-11-01 NOTE — Telephone Encounter (Signed)
Referral placed. Patient only wanted to speak with Janett Billow to explain why he wanted a different Home Health.

## 2016-11-03 DIAGNOSIS — E11621 Type 2 diabetes mellitus with foot ulcer: Secondary | ICD-10-CM | POA: Diagnosis not present

## 2016-11-03 DIAGNOSIS — R531 Weakness: Secondary | ICD-10-CM | POA: Diagnosis not present

## 2016-11-08 ENCOUNTER — Telehealth: Payer: Self-pay

## 2016-11-08 NOTE — Telephone Encounter (Signed)
Message left on clinical intake voicemail:   Free with [name] called to inform Dr.Reed of a missed visit with the patient today. Patient will be seen on Thursday. FYI Only

## 2016-11-10 ENCOUNTER — Telehealth: Payer: Self-pay

## 2016-11-10 ENCOUNTER — Other Ambulatory Visit: Payer: Self-pay | Admitting: *Deleted

## 2016-11-10 DIAGNOSIS — R531 Weakness: Secondary | ICD-10-CM | POA: Diagnosis not present

## 2016-11-10 DIAGNOSIS — E11621 Type 2 diabetes mellitus with foot ulcer: Secondary | ICD-10-CM | POA: Diagnosis not present

## 2016-11-10 MED ORDER — OXYCODONE HCL 10 MG PO TABS: 10.0000 mg | ORAL_TABLET | Freq: Three times a day (TID) | ORAL | 0 refills | Status: DC | PRN

## 2016-11-10 NOTE — Telephone Encounter (Signed)
Patient requested and will pick up 

## 2016-11-10 NOTE — Telephone Encounter (Signed)
I called patient to notify him that a prescription is ready to pick up at the office. He stated that he would pick it up tomorrow.   Rx for oxycodone 10 mg tablets. #90

## 2016-11-22 DIAGNOSIS — R531 Weakness: Secondary | ICD-10-CM | POA: Diagnosis not present

## 2016-11-22 DIAGNOSIS — E11621 Type 2 diabetes mellitus with foot ulcer: Secondary | ICD-10-CM | POA: Diagnosis not present

## 2016-11-24 DIAGNOSIS — R531 Weakness: Secondary | ICD-10-CM | POA: Diagnosis not present

## 2016-11-24 DIAGNOSIS — E11621 Type 2 diabetes mellitus with foot ulcer: Secondary | ICD-10-CM | POA: Diagnosis not present

## 2016-11-29 ENCOUNTER — Telehealth: Payer: Self-pay | Admitting: *Deleted

## 2016-11-29 DIAGNOSIS — R531 Weakness: Secondary | ICD-10-CM | POA: Diagnosis not present

## 2016-11-29 DIAGNOSIS — E11621 Type 2 diabetes mellitus with foot ulcer: Secondary | ICD-10-CM | POA: Diagnosis not present

## 2016-11-29 NOTE — Telephone Encounter (Signed)
Srea with [facility] health called and requested Verbal order to extend patient's home Health PT and requesting OT. Verbal orders given.

## 2016-12-02 DIAGNOSIS — R531 Weakness: Secondary | ICD-10-CM | POA: Diagnosis not present

## 2016-12-02 DIAGNOSIS — E11621 Type 2 diabetes mellitus with foot ulcer: Secondary | ICD-10-CM | POA: Diagnosis not present

## 2016-12-05 ENCOUNTER — Ambulatory Visit: Payer: Medicare Other | Admitting: Podiatry

## 2016-12-06 DIAGNOSIS — E11621 Type 2 diabetes mellitus with foot ulcer: Secondary | ICD-10-CM | POA: Diagnosis not present

## 2016-12-06 DIAGNOSIS — R531 Weakness: Secondary | ICD-10-CM | POA: Diagnosis not present

## 2016-12-08 ENCOUNTER — Ambulatory Visit: Payer: Medicare Other | Admitting: Podiatry

## 2016-12-09 DIAGNOSIS — E11621 Type 2 diabetes mellitus with foot ulcer: Secondary | ICD-10-CM | POA: Diagnosis not present

## 2016-12-09 DIAGNOSIS — R531 Weakness: Secondary | ICD-10-CM | POA: Diagnosis not present

## 2016-12-12 ENCOUNTER — Ambulatory Visit (INDEPENDENT_AMBULATORY_CARE_PROVIDER_SITE_OTHER): Payer: Medicare Other | Admitting: Podiatry

## 2016-12-12 ENCOUNTER — Encounter: Payer: Self-pay | Admitting: Podiatry

## 2016-12-12 VITALS — BP 112/57 | HR 81 | Resp 16

## 2016-12-12 DIAGNOSIS — E1140 Type 2 diabetes mellitus with diabetic neuropathy, unspecified: Secondary | ICD-10-CM

## 2016-12-12 DIAGNOSIS — L97502 Non-pressure chronic ulcer of other part of unspecified foot with fat layer exposed: Secondary | ICD-10-CM | POA: Diagnosis not present

## 2016-12-13 DIAGNOSIS — R531 Weakness: Secondary | ICD-10-CM | POA: Diagnosis not present

## 2016-12-13 DIAGNOSIS — E11621 Type 2 diabetes mellitus with foot ulcer: Secondary | ICD-10-CM | POA: Diagnosis not present

## 2016-12-14 ENCOUNTER — Other Ambulatory Visit: Payer: Self-pay | Admitting: *Deleted

## 2016-12-14 DIAGNOSIS — E11621 Type 2 diabetes mellitus with foot ulcer: Secondary | ICD-10-CM | POA: Diagnosis not present

## 2016-12-14 DIAGNOSIS — R531 Weakness: Secondary | ICD-10-CM | POA: Diagnosis not present

## 2016-12-14 MED ORDER — OXYCODONE HCL 10 MG PO TABS: 10.0000 mg | ORAL_TABLET | Freq: Three times a day (TID) | ORAL | 0 refills | Status: DC | PRN

## 2016-12-14 NOTE — Telephone Encounter (Signed)
Patient requested and wife will pick up 

## 2016-12-14 NOTE — Progress Notes (Signed)
Subjective:     Patient ID: [name], male   DOB: 10/24/1935, 82 y.o.   MRN: [id number]  HPI patient presents with chronic breakdown of skin left big toe left second toe with history of ulceration right lower leg with amputation   Review of Systems     Objective:   Physical Exam Neurovascular status intact negative Homans sign was noted with patient's left stump area crusted over and second digit with no current odor or proximal edema erythema    Assessment:     Chronic ulceration left hallux second toe being treated well with localized wound care and home care    Plan:     Debridement of lesion accomplished with no iatrogenic bleeding and advised this patient on soaks and utilizing sterile his mentation debrided necrotic tissue and applied Iodosorb

## 2016-12-15 ENCOUNTER — Telehealth: Payer: Self-pay | Admitting: Internal Medicine

## 2016-12-15 NOTE — Telephone Encounter (Signed)
appt rescheduled to 01/02/17.

## 2016-12-15 NOTE — Telephone Encounter (Signed)
New message       Pt has an appt at 2:30 today.  He is having transportation problems and want to know if he can be worked in at 4?  Please call

## 2016-12-15 NOTE — Telephone Encounter (Signed)
New message       Pt is having transportation problems. He has an appt for 2:30 today. Can he come at 4 and be worked in?

## 2016-12-21 DIAGNOSIS — E11621 Type 2 diabetes mellitus with foot ulcer: Secondary | ICD-10-CM | POA: Diagnosis not present

## 2016-12-21 DIAGNOSIS — R531 Weakness: Secondary | ICD-10-CM | POA: Diagnosis not present

## 2016-12-22 ENCOUNTER — Ambulatory Visit (INDEPENDENT_AMBULATORY_CARE_PROVIDER_SITE_OTHER): Payer: Medicare Other | Admitting: Orthopedic Surgery

## 2016-12-22 DIAGNOSIS — Z89611 Acquired absence of right leg above knee: Secondary | ICD-10-CM | POA: Diagnosis not present

## 2016-12-22 NOTE — Progress Notes (Addendum)
Office Visit Note   Patient: [name]           Date of Birth: Jul 12, 1935           MRN: [id number] Visit Date: 12/22/2016              Requested by: [name], DO [place],  [id number] PCP: [name], DO  Chief Complaint  Patient presents with  . Right Leg - Re-evaluation    Right above knee amputation 04/02/14 prosthetic evaluation    HPI: Patient is here to discuss above knee amputation prosthetic. He feels currently his prosthetic is unsafe to use. He is determined not to be wheelchair bound. He is wanting to discuss possibilities for a new prosthetic without having the expense of a new prosthetic. He has parts from his old prosthetic and hoping to fabricate something new. He states since last seeing [place] he has been able to decrease insulin. Patient has recently seen Dr. [name] 500mg  keflex tid discontinued January 10th. Maxcine Ham, RT    Assessment & Plan: Visit Diagnoses:  1. Status post above knee amputation of right lower extremity ([name])     Plan: Patient does not have the strength or balance to use a hinged knee. Patient does not want to follow-up with [employer]. Patient was given a prescription for [employer] to fabricate a socket with a fixed knee that could be released with sitting this should provide him stability for a K 2 level ambulation.  Patient's current prosthesis is uncomfortable and very tight proximally. He hasn't used the prosthesis in over a year due to inability to donn safely or completely. Patient has a prior functional status as a high k2 level, patient's current functional status is a low K2 level due to significant overall reduction in activity levels resulting in significant decreased strength and balance. It's medically necessary for patient to have a lightweight prosthesis with a locking suspension system, lightweight foot and a lightweight knee with a simple manual lock that can be used without a complex cable system.  This will allow patient to safely perform ADL's, increase strength, balance and regain independence through increased activity and function.  Follow-Up Instructions: Return if symptoms worsen or fail to improve.   Ortho Exam Examination patient is alert oriented no adenopathy well-dressed normal affect normal list wear for ambulation wheelchair. Examination patient is a well-healed above-knee amputation the right. There is no cellulitis no signs of infection. Patient was unable to use a hinged knee that was fabricated by [name] he did not feel safe in trying to use the leg.  Imaging: No results found.  Orders:  No orders of the defined types were placed in this encounter.  No orders of the defined types were placed in this encounter.    Procedures: No procedures performed  Clinical Data: No additional findings.  Subjective: Review of Systems  Objective: Vital Signs: There were no vitals taken for this visit.  Specialty Comments:  No specialty comments available.  PMFS History: Patient Active Problem List   Diagnosis Date Noted  . Diabetic foot ulcer associated with type 2 diabetes mellitus ([place]) 11/24/2014  . 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 Status post above knee amputation of right lower extremity ([name]) 04/08/2014  . PAD (peripheral artery disease) ([place]) 04/08/2014  . Gangrene associated with type 2 diabetes mellitus ([name]) 04/02/2014  . Generalized weakness 03/23/2014  . Wound dehiscence 03/15/2014  . Coagulopathy (  [place]) 03/09/2014  . Anemia due to acute blood loss 03/08/2014  . Diabetes mellitus ([name]) 03/02/2014  . Anemia 03/01/2014  . Constipation 02/18/2014  . Insomnia 02/18/2014  . Amputation stump infection ([name]) 02/06/2014  . Hx of right BKA ([name]) 01/28/2014  . Osteomyelitis ([name]) 01/16/2014  . Diabetic osteomyelitis ([name]) 01/09/2014  . Pacemaker- [name] 02/20/2013  . Hyperlipidemia 12/12/2012  . TIA (transient ischemic  attack) 12/11/2012  . ETOH abuse 04/02/2012  . ACTINIC KERATOSIS 11/29/2010  . MULTIPLE&UNSPEC OPEN WOUND LOWER LIMB COMP 10/19/2010  . ADHESIVE CAPSULITIS OF SHOULDER 08/30/2010  . ESOPHAGEAL STRICTURE 05/19/2010  . DYSPHAGIA UNSPECIFIED 04/08/2010  . DUPUYTREN'S CONTRACTURE, LEFT 12/21/2009  . CERVICAL STRAIN, ACUTE 08/28/2009  . OVERWEIGHT/OBESITY 04/07/2009  . RBBB 04/07/2009  . GERD 04/07/2009  . SYNCOPE AND COLLAPSE 04/07/2009  . FATIGUE / MALAISE 04/07/2009  . PHLEBITIS&THROMBOPHLEB SUP VESSELS LOWER EXTREM 02/28/2008  . CELLULITIS/ABSCESS, LEG 02/28/2008  . BACK PAIN, CHRONIC 10/31/2007  . Unspecified venous (peripheral) insufficiency 07/02/2007  . GOUT 06/26/2007  . Essential hypertension 06/26/2007  . Atrial fibrillation ([place]) 06/26/2007  . Chronic diastolic CHF (congestive heart failure) ([place]) 06/26/2007   Past Medical History:  Diagnosis Date  . Atrial fibrillation ([name])    permanent  . Backache, unspecified   . Cellulitis and abscess of leg, except foot   . CHF (congestive heart failure) ([name])   . Diverticulosis 05-2006   Dr. [name]   . Esophageal stricture   . Fatigue   . GERD (gastroesophageal reflux disease)   . Gout, unspecified   . Heart failure   . Hx of colonic polyps 05-2006   (Adenomatous)Dr. [name]  . Hx of radiation therapy 06/16/14- 07/18/14   mult squamous cell carcinomas of scalp  . Hypertension   . Long term (current) use of anticoagulants   . Overweight(278.02)   . Pacemaker- [place] 02/20/2013  . Phlebitis and thrombophlebitis of superficial vessels of lower extremities   . Right bundle branch block   . Skin cancer    squamous cell carcinoma scalp  . Syncope and collapse   . Tachycardia-bradycardia ([place])    s/p PPM  . Type II or unspecified type diabetes mellitus without mention of complication, not stated as uncontrolled   . Unspecified venous (peripheral) insufficiency   . Unspecified venous (peripheral) insufficiency     Family  History  Problem Relation Age of Onset  . Pneumonia Mother   . Heart disease Father   . Diabetes Father   . Colon cancer Neg Hx     Past Surgical History:  Procedure Laterality Date  . ABOVE KNEE LEG AMPUTATION Right 04/02/2014   DR Amethyst Gainer  . AMPUTATION Right 01/20/2014   Procedure: AMPUTATION BELOW KNEE- right;  Surgeon: [name], MD;  Location: [place];  Service: Orthopedics;  Laterality: Right;  Right Below Knee Amputation  . AMPUTATION Right 02/14/2014   Procedure: AMPUTATION BELOW KNEE;  Surgeon: [name], MD;  Location: [place];  Service: Orthopedics;  Laterality: Right;  Right Below Knee Amputation Revision  . AMPUTATION Right 03/07/2014   Procedure: AMPUTATION BELOW KNEE;  Surgeon: [name], MD;  Location: [place];  Service: Orthopedics;  Laterality: Right;  Right Below Knee Amputation Revision  . AMPUTATION Right 03/18/2014   Procedure: revision of  AMPUTATION BELOW KNEE;  Surgeon: [name], MD;  Location: [place];  Service: Orthopedics;  Laterality: Right;  Revision Right Below Knee Amputation  . AMPUTATION Right 04/02/2014   Procedure: AMPUTATION ABOVE  KNEE;  Surgeon: [name], MD;  Location: [place];  Service: Orthopedics;  Laterality: Right;  Right Above Knee Amputation  . EYE SURGERY    . I&D EXTREMITY Right 01/17/2014   Procedure: IRRIGATION AND DEBRIDEMENT RIGHT HEEL  WITH CULTURES AND BONE BIOPSY, placement of wound vac;  Surgeon: Theodoro Kos, DO;  Location: [place];  Service: Plastics;  Laterality: Right;  . LAMINECTOMY    . LUMBAR FUSION    . PACEMAKER INSERTION  8/12   SJM by [name] for tachy/brady syndrome  . ROTATOR CUFF REPAIR    . ruptured rt rectus muscle    . SKIN BIOPSY     scalp   Social History   Occupational History  . Retired Hotel manager    Social History Main Topics  . Smoking status: Former Research scientist (life sciences)  . Smokeless tobacco: Never Used     Comment: Quit at age 29   . Alcohol use No     Comment: none currenlty  . Drug use: No  . Sexual  activity: Not Currently

## 2016-12-30 DIAGNOSIS — R531 Weakness: Secondary | ICD-10-CM | POA: Diagnosis not present

## 2016-12-30 DIAGNOSIS — E11621 Type 2 diabetes mellitus with foot ulcer: Secondary | ICD-10-CM | POA: Diagnosis not present

## 2017-01-04 ENCOUNTER — Ambulatory Visit (INDEPENDENT_AMBULATORY_CARE_PROVIDER_SITE_OTHER): Payer: Medicare Other | Admitting: *Deleted

## 2017-01-04 DIAGNOSIS — I495 Sick sinus syndrome: Secondary | ICD-10-CM | POA: Diagnosis not present

## 2017-01-04 DIAGNOSIS — I4891 Unspecified atrial fibrillation: Secondary | ICD-10-CM | POA: Diagnosis not present

## 2017-01-04 DIAGNOSIS — Z950 Presence of cardiac pacemaker: Secondary | ICD-10-CM

## 2017-01-04 DIAGNOSIS — I4821 Permanent atrial fibrillation: Secondary | ICD-10-CM

## 2017-01-04 LAB — CUP PACEART INCLINIC DEVICE CHECK
Brady Statistic RV Percent Paced: 17 %
Date Time Interrogation Session: 20180214162543
Implantable Lead Implant Date: 20120817
Implantable Pulse Generator Implant Date: 20120817
Lead Channel Pacing Threshold Amplitude: 0.75 V
Lead Channel Pacing Threshold Amplitude: 0.75 V
Lead Channel Pacing Threshold Pulse Width: 0.4 ms
Lead Channel Sensing Intrinsic Amplitude: 9.7 mV
MDC IDC LEAD LOCATION: 753860
MDC IDC MSMT BATTERY VOLTAGE: 2.96 V
MDC IDC MSMT LEADCHNL RV IMPEDANCE VALUE: 525 Ohm
MDC IDC MSMT LEADCHNL RV PACING THRESHOLD PULSEWIDTH: 0.4 ms
MDC IDC SET LEADCHNL RV PACING AMPLITUDE: 2 V
MDC IDC SET LEADCHNL RV PACING PULSEWIDTH: 0.4 ms
MDC IDC SET LEADCHNL RV SENSING SENSITIVITY: 2 mV
Pulse Gen Serial Number: 7244744

## 2017-01-04 NOTE — Progress Notes (Signed)
Pacemaker check in clinic. Normal device function. Threshold, sensing, impedance consistent with previous measurements. Device programmed to maximize longevity. No high ventricular rates noted. Chronic AF- on Eliquis. Device programmed at appropriate safety margins. Histogram distribution appropriate for patient activity level. Device programmed to optimize intrinsic conduction. Estimated longevity 11.4 years. ROV with SK in July.

## 2017-01-12 ENCOUNTER — Ambulatory Visit (INDEPENDENT_AMBULATORY_CARE_PROVIDER_SITE_OTHER): Payer: Medicare Other

## 2017-01-12 VITALS — BP 112/60 | HR 62 | Temp 98.8°F | Wt 180.8 lb

## 2017-01-12 DIAGNOSIS — Z0000 Encounter for general adult medical examination without abnormal findings: Secondary | ICD-10-CM | POA: Diagnosis not present

## 2017-01-12 MED ORDER — OXYCODONE HCL 10 MG PO TABS: 10.0000 mg | ORAL_TABLET | Freq: Three times a day (TID) | ORAL | 0 refills | Status: DC | PRN

## 2017-01-12 NOTE — Progress Notes (Signed)
   I reviewed health advisor's note, was available for consultation and agree with the assessment and plan as written.    Nikalas Bramel L. Kashten Gowin, D.O. [facility] [address], [place] [address] Cell Phone (Mon-Fri 8am-5pm):  [phone] On Call:  [phone] & follow prompts after 5pm & weekends Office Phone:  [phone] Office Fax:  [phone]   Quick Notes   Health Maintenance:  None    Abnormal Screen: MMSE-29/30 passed clock test     Patient Concerns:  None    Nurse Concerns:  None

## 2017-01-12 NOTE — Patient Instructions (Addendum)
Mr. Bellman , Thank you for taking time to come for your Medicare Wellness Visit. I appreciate your ongoing commitment to your health goals. Please review the following plan we discussed and let me know if I can assist you in the future.   These are the goals we discussed: Goals    . <enter goal here>          I have been working on making my prosthesis and being able to walk again.         This is a list of the screening recommended for you and due dates:  Health Maintenance  Topic Date Due  . Tetanus Vaccine  11/22/2011  . Eye exam for diabetics  10/22/2015  . Complete foot exam   03/14/2017  . Hemoglobin A1C  04/19/2017  . Flu Shot  Completed  . Pneumonia vaccines  Completed  Preventive Care for Adults  A healthy lifestyle and preventive care can promote health and wellness. Preventive health guidelines for adults include the following key practices.  . A routine yearly physical is a good way to check with your health care provider about your health and preventive screening. It is a chance to share any concerns and updates on your health and to receive a thorough exam.  . Visit your dentist for a routine exam and preventive care every 6 months. Brush your teeth twice a day and floss once a day. Good oral hygiene prevents tooth decay and gum disease.  . The frequency of eye exams is based on your age, health, family medical history, use  of contact lenses, and other factors. Follow your health care provider's ecommendations for frequency of eye exams.  . Eat a healthy diet. Foods like vegetables, fruits, whole grains, low-fat dairy products, and lean protein foods contain the nutrients you need without too many calories. Decrease your intake of foods high in solid fats, added sugars, and salt. Eat the right amount of calories for you. Get information about a proper diet from your health care provider, if necessary.  . Regular physical exercise is one of the most important things  you can do for your health. Most adults should get at least 150 minutes of moderate-intensity exercise (any activity that increases your heart rate and causes you to sweat) each week. In addition, most adults need muscle-strengthening exercises on 2 or more days a week.  Silver Sneakers may be a benefit available to you. To determine eligibility, you may visit the website: www.silversneakers.com or contact program at 365-344-5183 Mon-Fri between 8AM-8PM.   . Maintain a healthy weight. The body mass index (BMI) is a screening tool to identify possible weight problems. It provides an estimate of body fat based on height and weight. Your health care provider can find your BMI and can help you achieve or maintain a healthy weight.   For adults 20 years and older: ? A BMI below 18.5 is considered underweight. ? A BMI of 18.5 to 24.9 is normal. ? A BMI of 25 to 29.9 is considered overweight. ? A BMI of 30 and above is considered obese.   . Maintain normal blood lipids and cholesterol levels by exercising and minimizing your intake of saturated fat. Eat a balanced diet with plenty of fruit and vegetables. Blood tests for lipids and cholesterol should begin at age 41 and be repeated every 5 years. If your lipid or cholesterol levels are high, you are over 50, or you are at high risk for heart disease, you  may need your cholesterol levels checked more frequently. Ongoing high lipid and cholesterol levels should be treated with medicines if diet and exercise are not working.  . If you smoke, find out from your health care provider how to quit. If you do not use tobacco, please do not start.  . If you choose to drink alcohol, please do not consume more than 2 drinks per day. One drink is considered to be 12 ounces (355 mL) of beer, 5 ounces (148 mL) of wine, or 1.5 ounces (44 mL) of liquor.  . If you are 26-44 years old, ask your health care provider if you should take aspirin to prevent strokes.  . Use  sunscreen. Apply sunscreen liberally and repeatedly throughout the day. You should seek shade when your shadow is shorter than you. Protect yourself by wearing long sleeves, pants, a wide-brimmed hat, and sunglasses year round, whenever you are outdoors.  . Once a month, do a whole body skin exam, using a mirror to look at the skin on your back. Tell your health care provider of new moles, moles that have irregular borders, moles that are larger than a pencil eraser, or moles that have changed in shape or color.

## 2017-01-12 NOTE — Progress Notes (Signed)
Subjective:   [name] is a 82 y.o. male who presents for Medicare Annual/Subsequent preventive examination.  Review of Systems:  Cardiac Risk Factors include: advanced age (>49men, >47 women);diabetes mellitus;hypertension;male gender;family history of premature cardiovascular disease;sedentary lifestyle     Objective:    Vitals: BP 112/60 (BP Location: Left Arm, Patient Position: Sitting, Cuff Size: Normal)   Pulse 62   Temp 98.8 F (37.1 C) (Oral)   Wt 180 lb 12.8 oz (82 kg)   SpO2 98%   BMI 25.94 kg/m   Body mass index is 25.94 kg/m.  Tobacco History  Smoking Status  . Former Smoker  Smokeless Tobacco  . Never Used    Comment: Quit at age 59      Counseling given: No   Past Medical History:  Diagnosis Date  . Atrial fibrillation ([place])    permanent  . Backache, unspecified   . Cellulitis and abscess of leg, except foot   . CHF (congestive heart failure) ([place])   . Diverticulosis 05-2006   Dr. [name]   . Esophageal stricture   . Fatigue   . GERD (gastroesophageal reflux disease)   . Gout, unspecified   . Heart failure   . Hx of colonic polyps 05-2006   (Adenomatous)Dr. [name]  . Hx of radiation therapy 06/16/14- 07/18/14   mult squamous cell carcinomas of scalp  . Hypertension   . Long term (current) use of anticoagulants   . Overweight(278.02)   . Pacemaker- [place] 02/20/2013  . Phlebitis and thrombophlebitis of superficial vessels of lower extremities   . Right bundle branch block   . Skin cancer    squamous cell carcinoma scalp  . Syncope and collapse   . Tachycardia-bradycardia ([place])    s/p PPM  . Type II or unspecified type diabetes mellitus without mention of complication, not stated as uncontrolled   . Unspecified venous (peripheral) insufficiency   . Unspecified venous (peripheral) insufficiency    Past Surgical History:  Procedure Laterality Date  . ABOVE KNEE LEG AMPUTATION Right 04/02/2014   DR DUDA  . AMPUTATION Right 01/20/2014     Procedure: AMPUTATION BELOW KNEE- right;  Surgeon: [name], MD;  Location: [facility];  Service: Orthopedics;  Laterality: Right;  Right Below Knee Amputation  . AMPUTATION Right 02/14/2014   Procedure: AMPUTATION BELOW KNEE;  Surgeon: [name], MD;  Location: [facility];  Service: Orthopedics;  Laterality: Right;  Right Below Knee Amputation Revision  . AMPUTATION Right 03/07/2014   Procedure: AMPUTATION BELOW KNEE;  Surgeon: [name], MD;  Location: [facility];  Service: Orthopedics;  Laterality: Right;  Right Below Knee Amputation Revision  . AMPUTATION Right 03/18/2014   Procedure: revision of  AMPUTATION BELOW KNEE;  Surgeon: [name], MD;  Location: [facility];  Service: Orthopedics;  Laterality: Right;  Revision Right Below Knee Amputation  . AMPUTATION Right 04/02/2014   Procedure: AMPUTATION ABOVE KNEE;  Surgeon: [name], MD;  Location: [facility];  Service: Orthopedics;  Laterality: Right;  Right Above Knee Amputation  . EYE SURGERY    . I&D EXTREMITY Right 01/17/2014   Procedure: IRRIGATION AND DEBRIDEMENT RIGHT HEEL  WITH CULTURES AND BONE BIOPSY, placement of wound vac;  Surgeon: Theodoro Kos, DO;  Location: [facility];  Service: Plastics;  Laterality: Right;  . LAMINECTOMY    . LUMBAR FUSION    . PACEMAKER INSERTION  8/12   [facility] by [name] for tachy/brady syndrome  . ROTATOR CUFF  REPAIR    . ruptured rt rectus muscle    . SKIN BIOPSY     scalp   Family History  Problem Relation Age of Onset  . Pneumonia Mother   . Heart disease Father   . Diabetes Father   . Colon cancer Neg Hx    History  Sexual Activity  . Sexual activity: Not Currently    Outpatient Encounter Prescriptions as of 01/12/2017  Medication Sig  . apixaban (ELIQUIS) 5 MG TABS tablet Take 1 tablet (5 mg total) by mouth daily.  . B-D ULTRAFINE III SHORT PEN 31G X 8 MM MISC USE AS DIRECTED  . diltiazem (CARDIZEM CD) 180 MG 24 hr capsule Take 1 capsule (180 mg total) by mouth daily.  . furosemide (LASIX) 20  MG tablet Take 1 tablet (20 mg total) by mouth every other day.  . gabapentin (NEURONTIN) 100 MG capsule Take 1 capsule (100 mg total) by mouth 2 (two) times daily.  Marland Kitchen glucose blood (ACCU-CHEK AVIVA PLUS) test strip Use to test blood sugar three times daily. Dx E11.59  . Insulin Glargine (LANTUS SOLOSTAR) 100 UNIT/ML Solostar Pen INJECT 22 UNITS AT BEDTIME  . insulin lispro (HUMALOG KWIKPEN) 100 UNIT/ML KiwkPen INJECT 6 UNITS BEFORE MEALS-HOLD FOR BLOOD SUGAR LESS THAN 100  . lisinopril (PRINIVIL,ZESTRIL) 10 MG tablet Take 1 tablet (10 mg total) by mouth daily.  . Nutritional Supplements (FEEDING SUPPLEMENT, GLUCERNA 1.2 CAL,) LIQD Take 237 mLs by mouth daily as needed. Reported on 05/25/2016  . omeprazole (PRILOSEC) 20 MG capsule TAKE (1) CAPSULE DAILY.  Marland Kitchen Oxycodone HCl 10 MG TABS Take 1 tablet (10 mg total) by mouth every 8 (eight) hours as needed. For pain  . triamcinolone cream (KENALOG) 0.1 % Apply 1 application topically 2 (two) times daily.  . [DISCONTINUED] cephALEXin (KEFLEX) 500 MG capsule Take 1 capsule (500 mg total) by mouth 3 (three) times daily. (Patient not taking: Reported on 01/04/2017)  . [DISCONTINUED] Oxycodone HCl 10 MG TABS Take 1 tablet (10 mg total) by mouth every 8 (eight) hours as needed. For pain  . [DISCONTINUED] senna-docusate (SENNA-S) 8.6-50 MG per tablet Take 1 tablet by mouth daily as needed.    No facility-administered encounter medications on file as of 01/12/2017.     Activities of Daily Living In your present state of health, do you have any difficulty performing the following activities: 01/12/2017 01/14/2016  Hearing? N N  Vision? N N  Difficulty concentrating or making decisions? N N  Walking or climbing stairs? Y Y  Dressing or bathing? N N  Doing errands, shopping? Y N  Preparing Food and eating ? N -  Using the Toilet? N -  In the past six months, have you accidently leaked urine? Y -  Do you have problems with loss of bowel control? N -  Managing  your Medications? N -  Managing your Finances? N -  Housekeeping or managing your Housekeeping? N -  Some recent data might be hidden    Patient Care Team: [name], DO as PCP - General (Geriatric Medicine) Ricard Dillon, MD as Consulting Physician (Internal Medicine) Deboraha Sprang, MD as Consulting Physician (Cardiology) Thompson Grayer, MD as Consulting Physician (Cardiology) [name], MD as Consulting Physician (Orthopedic Surgery) Wallene Huh, DPM as Consulting Physician (Podiatry)   Assessment:    Exercise Activities and Dietary recommendations Exercise limited by: orthopedic condition(s)  Goals    . <enter goal here>  I have been working on making my prosthesis and being able to walk again.        Hearing Screening Comments: Last hearing screen was done about a year ago. Denies hearing loss at this time.  Vision Screening Comments: Last eye exam was done Jan. 2016 with Dr. Lovena Le. Pt denies any eye sight at this time.   Fall Risk Fall Risk  01/12/2017 10/20/2016 07/18/2016 01/14/2016 11/18/2015  Falls in the past year? Exclusion - non ambulatory No No No No  Number falls in past yr: - - - - -  Risk for fall due to : Impaired mobility - - - -   Depression Screen PHQ 2/9 Scores 01/12/2017 07/18/2016 01/14/2016 06/29/2015  PHQ - 2 Score 0 0 0 0    Cognitive Function MMSE - Mini Mental State Exam 01/12/2017 01/14/2016 12/29/2014  Not completed: - (No Data) -  Orientation to time 5 5 5   Orientation to Place 4 5 5   Registration 3 3 3   Attention/ Calculation 5 5 5   Recall 3 3 3   Language- name 2 objects 2 2 2   Language- repeat 1 1 1   Language- follow 3 step command 3 3 3   Language- read & follow direction 1 1 1   Write a sentence 1 1 1   Copy design 1 1 1   Total score 29 30 30         Immunization History  Administered Date(s) Administered  . Influenza Split 08/22/2011  . Influenza Whole 11/21/2004, 08/22/2008, 09/03/2009, 08/12/2010  .  Influenza,inj,Quad PF,36+ Mos 09/02/2013, 10/08/2015, 07/18/2016  . Influenza-Unspecified 09/03/2014  . Pneumococcal Conjugate-13 06/29/2015  . Pneumococcal Polysaccharide-23 11/22/2003, 12/12/2012  . Td 11/21/2001  . Zoster 11/21/2008   Screening Tests Health Maintenance  Topic Date Due  . TETANUS/TDAP  11/22/2011  . OPHTHALMOLOGY EXAM  10/22/2015  . FOOT EXAM  03/14/2017  . HEMOGLOBIN A1C  04/19/2017  . INFLUENZA VACCINE  Completed  . PNA vac Low Risk Adult  Completed      Plan:    I have personally reviewed and addressed the Medicare Annual Wellness questionnaire and have noted the following in the patient's chart:  A. Medical and social history B. Use of alcohol, tobacco or illicit drugs  C. Current medications and supplements D. Functional ability and status E.  Nutritional status F.  Physical activity G. Advance directives H. List of other physicians I.  Hospitalizations, surgeries, and ER visits in previous 12 months J.  [place] to include hearing, vision, cognitive, depression L. Referrals and appointments - none  In addition, I have reviewed and discussed with patient certain preventive protocols, quality metrics, and best practice recommendations. A written personalized care plan for preventive services as well as general preventive health recommendations were provided to patient.  See attached scanned questionnaire for additional information.   Signed,   Allyn Kenner, LPN Health Advisor

## 2017-01-17 ENCOUNTER — Other Ambulatory Visit: Payer: Self-pay | Admitting: Nurse Practitioner

## 2017-01-17 DIAGNOSIS — L853 Xerosis cutis: Secondary | ICD-10-CM

## 2017-01-19 ENCOUNTER — Encounter: Payer: Medicare Other | Admitting: Internal Medicine

## 2017-01-19 ENCOUNTER — Ambulatory Visit: Payer: Medicare Other

## 2017-01-25 ENCOUNTER — Ambulatory Visit: Payer: Medicare Other | Admitting: Podiatry

## 2017-01-25 ENCOUNTER — Other Ambulatory Visit: Payer: Self-pay | Admitting: Internal Medicine

## 2017-01-31 ENCOUNTER — Other Ambulatory Visit: Payer: Self-pay | Admitting: Nurse Practitioner

## 2017-02-02 ENCOUNTER — Ambulatory Visit (INDEPENDENT_AMBULATORY_CARE_PROVIDER_SITE_OTHER): Payer: Medicare Other | Admitting: Podiatry

## 2017-02-02 ENCOUNTER — Encounter: Payer: Self-pay | Admitting: Podiatry

## 2017-02-02 DIAGNOSIS — L97502 Non-pressure chronic ulcer of other part of unspecified foot with fat layer exposed: Secondary | ICD-10-CM | POA: Diagnosis not present

## 2017-02-05 NOTE — Progress Notes (Signed)
Subjective:     Patient ID: [name], male   DOB: 1935/05/02, 82 y.o.   MRN: [id number]  HPI patient presents with advanced disease of his left big toe second toe that we have been working on conservatively and so far it's been able to prevent him from needing amputation   Review of Systems     Objective:   Physical Exam Neurovascular status unchanged left with patient with significant risk factors who has drainage on the left hallux localized in nature second toe with crusted tissue formation and no current odor emitting from the area    Assessment:     Chronic ulceration left hallux second toe that does not heal completely do to circulatory loss with history of right leg amputation    Plan:     Reviewed condition and debrided tissue and applied Iodosorb dressing to the area. Instructed on continuing to soak and see back 6 weeks or earlier if any issues should occur

## 2017-02-14 ENCOUNTER — Telehealth (INDEPENDENT_AMBULATORY_CARE_PROVIDER_SITE_OTHER): Payer: Self-pay | Admitting: Orthopedic Surgery

## 2017-02-14 NOTE — Telephone Encounter (Signed)
Patient called having a few questions for you about a consultation appointment for his new leg. CB # [phone]

## 2017-02-14 NOTE — Telephone Encounter (Signed)
Pt would like to discuss his prosthetic

## 2017-02-14 NOTE — Telephone Encounter (Signed)
Patient wants to schedule appointment with with [place] to include [facility] his orthotists

## 2017-02-16 ENCOUNTER — Encounter: Payer: Medicare Other | Admitting: Internal Medicine

## 2017-02-21 ENCOUNTER — Other Ambulatory Visit: Payer: Self-pay | Admitting: *Deleted

## 2017-02-21 MED ORDER — OXYCODONE HCL 10 MG PO TABS: 10.0000 mg | ORAL_TABLET | Freq: Three times a day (TID) | ORAL | 0 refills | Status: DC | PRN

## 2017-02-21 NOTE — Telephone Encounter (Signed)
Patient requested and will pick up 

## 2017-02-23 ENCOUNTER — Ambulatory Visit (INDEPENDENT_AMBULATORY_CARE_PROVIDER_SITE_OTHER): Payer: Medicare Other | Admitting: Orthopedic Surgery

## 2017-02-27 ENCOUNTER — Telehealth (INDEPENDENT_AMBULATORY_CARE_PROVIDER_SITE_OTHER): Payer: Self-pay | Admitting: Radiology

## 2017-02-27 NOTE — Telephone Encounter (Signed)
Can you make an addendum to your office visit for this patient on 12/22/16? Needs to include this below:  Patient's current prosthesis is uncomfortable and very tight proximally. He hasn't used the prosthesis in over a year due to inability to donn safely or completely. Patient has a prior functional status as a high k2 level, patient's current functional status is a low K2 level due to significant overall reduction in activity levels resulting in significant decreased strength and balance. It's medically necessary for patient to have a lightweight prosthesis with a locking suspension system, lightweight foot and a lightweight knee with a simple manual lock that can be used without a complex cable system. This will allow patient to safely perform ADL's, increase strength, balance and regain independence through increased activity and function.

## 2017-02-28 ENCOUNTER — Telehealth (INDEPENDENT_AMBULATORY_CARE_PROVIDER_SITE_OTHER): Payer: Self-pay | Admitting: Orthopedic Surgery

## 2017-02-28 NOTE — Telephone Encounter (Signed)
Faxed office note to [facility] at [phone]

## 2017-02-28 NOTE — Telephone Encounter (Signed)
The February 1 office note was changed.

## 2017-02-28 NOTE — Telephone Encounter (Signed)
Faxed to [facility] at [phone]

## 2017-02-28 NOTE — Telephone Encounter (Signed)
Can you send this info to [facility]   Patient's current prosthesis is uncomfortable and very tight proximally. He hasn't used the prosthesis in over a year due to inability to donn safely or completely. Patient has a prior functional status as a high k2 level, patient's current functional status is a low K2 level due to significant overall reduction in activity levels resulting in significant decreased strength and balance. It's medically necessary for patient to have a lightweight prosthesis with a locking suspension system, lightweight foot and a lightweight knee with a simple manual lock that can be used without a complex cable system. This will allow patient to safely perform ADL's, increase strength, balance and regain independence through increased activity and function.  Meridee Score

## 2017-02-28 NOTE — Telephone Encounter (Signed)
Patient called wanting to ask you a question about his appointment. CB # [phone]

## 2017-02-28 NOTE — Telephone Encounter (Signed)
I called and spoke with patient we will go ahead and cancel his appointment. Addendum is being done. And I will fax this information to [facility] for fabrication of prosthetic.

## 2017-03-01 ENCOUNTER — Ambulatory Visit (INDEPENDENT_AMBULATORY_CARE_PROVIDER_SITE_OTHER): Payer: Medicare Other | Admitting: Orthopedic Surgery

## 2017-03-03 ENCOUNTER — Encounter: Payer: Medicare Other | Admitting: Internal Medicine

## 2017-03-06 ENCOUNTER — Encounter (HOSPITAL_COMMUNITY): Payer: Self-pay | Admitting: Emergency Medicine

## 2017-03-06 ENCOUNTER — Emergency Department (HOSPITAL_COMMUNITY): Payer: Medicare Other

## 2017-03-06 ENCOUNTER — Emergency Department (HOSPITAL_COMMUNITY)
Admission: EM | Admit: 2017-03-06 | Discharge: 2017-03-06 | Disposition: A | Discharge status: Home / Self Care | Payer: Medicare Other | Attending: Emergency Medicine | Admitting: Emergency Medicine

## 2017-03-06 DIAGNOSIS — Y929 Unspecified place or not applicable: Secondary | ICD-10-CM | POA: Insufficient documentation

## 2017-03-06 DIAGNOSIS — Z794 Long term (current) use of insulin: Secondary | ICD-10-CM | POA: Insufficient documentation

## 2017-03-06 DIAGNOSIS — Z7901 Long term (current) use of anticoagulants: Secondary | ICD-10-CM | POA: Diagnosis not present

## 2017-03-06 DIAGNOSIS — S91102A Unspecified open wound of left great toe without damage to nail, initial encounter: Secondary | ICD-10-CM | POA: Diagnosis not present

## 2017-03-06 DIAGNOSIS — J069 Acute upper respiratory infection, unspecified: Secondary | ICD-10-CM | POA: Diagnosis not present

## 2017-03-06 DIAGNOSIS — E11649 Type 2 diabetes mellitus with hypoglycemia without coma: Secondary | ICD-10-CM | POA: Insufficient documentation

## 2017-03-06 DIAGNOSIS — Y999 Unspecified external cause status: Secondary | ICD-10-CM | POA: Insufficient documentation

## 2017-03-06 DIAGNOSIS — Z87891 Personal history of nicotine dependence: Secondary | ICD-10-CM | POA: Insufficient documentation

## 2017-03-06 DIAGNOSIS — Z950 Presence of cardiac pacemaker: Secondary | ICD-10-CM | POA: Diagnosis not present

## 2017-03-06 DIAGNOSIS — R410 Disorientation, unspecified: Secondary | ICD-10-CM | POA: Diagnosis not present

## 2017-03-06 DIAGNOSIS — R404 Transient alteration of awareness: Secondary | ICD-10-CM | POA: Diagnosis not present

## 2017-03-06 DIAGNOSIS — I110 Hypertensive heart disease with heart failure: Secondary | ICD-10-CM | POA: Insufficient documentation

## 2017-03-06 DIAGNOSIS — B9789 Other viral agents as the cause of diseases classified elsewhere: Secondary | ICD-10-CM

## 2017-03-06 DIAGNOSIS — R531 Weakness: Secondary | ICD-10-CM | POA: Diagnosis not present

## 2017-03-06 DIAGNOSIS — Z85828 Personal history of other malignant neoplasm of skin: Secondary | ICD-10-CM | POA: Insufficient documentation

## 2017-03-06 DIAGNOSIS — Y939 Activity, unspecified: Secondary | ICD-10-CM | POA: Insufficient documentation

## 2017-03-06 DIAGNOSIS — I509 Heart failure, unspecified: Secondary | ICD-10-CM | POA: Diagnosis not present

## 2017-03-06 DIAGNOSIS — E162 Hypoglycemia, unspecified: Secondary | ICD-10-CM

## 2017-03-06 DIAGNOSIS — X58XXXA Exposure to other specified factors, initial encounter: Secondary | ICD-10-CM | POA: Insufficient documentation

## 2017-03-06 DIAGNOSIS — Z79899 Other long term (current) drug therapy: Secondary | ICD-10-CM | POA: Diagnosis not present

## 2017-03-06 DIAGNOSIS — R05 Cough: Secondary | ICD-10-CM | POA: Diagnosis not present

## 2017-03-06 LAB — COMPREHENSIVE METABOLIC PANEL
ALBUMIN: 3.2 g/dL — AB (ref 3.5–5.0)
ALT: 9 U/L — AB (ref 17–63)
ANION GAP: 11 (ref 5–15)
AST: 19 U/L (ref 15–41)
Alkaline Phosphatase: 77 U/L (ref 38–126)
BILIRUBIN TOTAL: 0.5 mg/dL (ref 0.3–1.2)
BUN: 15 mg/dL (ref 6–20)
CALCIUM: 9.1 mg/dL (ref 8.9–10.3)
CO2: 26 mmol/L (ref 22–32)
CREATININE: 1.72 mg/dL — AB (ref 0.61–1.24)
Chloride: 99 mmol/L — ABNORMAL LOW (ref 101–111)
GFR calc non Af Amer: 36 mL/min — ABNORMAL LOW (ref >60)
GFR, EST AFRICAN AMERICAN: 42 mL/min — AB (ref >60)
Glucose, Bld: 99 mg/dL (ref 65–99)
Potassium: 3.8 mmol/L (ref 3.5–5.1)
Sodium: 136 mmol/L (ref 135–145)
TOTAL PROTEIN: 6.8 g/dL (ref 6.5–8.1)

## 2017-03-06 LAB — CBC WITH DIFFERENTIAL/PLATELET
Basophils Absolute: 0 10*3/uL (ref 0.0–0.1)
Basophils Relative: 0 %
Eosinophils Absolute: 0.2 10*3/uL (ref 0.0–0.7)
Eosinophils Relative: 2 %
HEMATOCRIT: 34 % — AB (ref 39.0–52.0)
Hemoglobin: 11.1 g/dL — ABNORMAL LOW (ref 13.0–17.0)
LYMPHS ABS: 1.3 10*3/uL (ref 0.7–4.0)
Lymphocytes Relative: 13 %
MCH: 27.1 pg (ref 26.0–34.0)
MCHC: 32.6 g/dL (ref 30.0–36.0)
MCV: 83.1 fL (ref 78.0–100.0)
MONOS PCT: 6 %
Monocytes Absolute: 0.5 10*3/uL (ref 0.1–1.0)
NEUTROS ABS: 7.7 10*3/uL (ref 1.7–7.7)
Neutrophils Relative %: 79 %
Platelets: 204 10*3/uL (ref 150–400)
RBC: 4.09 MIL/uL — AB (ref 4.22–5.81)
RDW: 14.7 % (ref 11.5–15.5)
WBC: 9.7 10*3/uL (ref 4.0–10.5)

## 2017-03-06 LAB — URINALYSIS, ROUTINE W REFLEX MICROSCOPIC
BACTERIA UA: NONE SEEN
BILIRUBIN URINE: NEGATIVE
Glucose, UA: 50 mg/dL — AB
Hgb urine dipstick: NEGATIVE
KETONES UR: NEGATIVE mg/dL
LEUKOCYTES UA: NEGATIVE
Nitrite: NEGATIVE
PH: 7 (ref 5.0–8.0)
PROTEIN: 100 mg/dL — AB
RBC / HPF: NONE SEEN RBC/hpf (ref 0–5)
SPECIFIC GRAVITY, URINE: 1.008 (ref 1.005–1.030)
SQUAMOUS EPITHELIAL / LPF: NONE SEEN

## 2017-03-06 LAB — CBG MONITORING, ED
GLUCOSE-CAPILLARY: 99 mg/dL (ref 65–99)
GLUCOSE-CAPILLARY: 83 mg/dL (ref 65–99)
GLUCOSE-CAPILLARY: 118 mg/dL — AB (ref 65–99)

## 2017-03-06 NOTE — ED Provider Notes (Signed)
TIME SEEN: 4:48 AM  CHIEF COMPLAINT: Hypoglycemia, generalized weakness, cough  HPI: Patient is a 82 year old male with history of atrial fibrillation on Eliquis, CHF status post pacemaker, insulin-dependent diabetes, hypertension who lives at home with his wife who presents to the emergency department via EMS with generalized weakness. He states that "thought I was shriveled up". States he was unable to move. EMS found his blood sugar was 55. He was given juice and it came up into the 90s. He reports he feels like his weakness has resolved once his blood sugar improved. He does report however the past week he has had a productive cough with brown sputum. Reports fever as high as 101 but last fever was 2-3 days ago. No recent chest pain, shortness of breath, vomiting, diarrhea, numbness, tingling or focal weakness, headache, head injury, medication changes. States he is on Humalog sliding scale insulin and Lantus 22 units at night. States he took Humalog yesterday afternoon 6 units and then 22 units last night before bed at 11:30 pm. No changes in his insulin regimen. He is not sure what caused his blood sugar to drop tonight. He also has a chronic wound to the left toe that he states has been there for 3 years and has been draining for the past several months. It is not worse than normal. It is not red or painful. He states he has been on several rounds of antibiotics without any significant change. He is status post right AKA.  ROS: See HPI Constitutional: no fever  Eyes: no drainage  ENT: no runny nose   Cardiovascular:  no chest pain  Resp: no SOB  GI: no vomiting GU: no dysuria Integumentary: no rash  Allergy: no hives  Musculoskeletal: no leg swelling  Neurological: no slurred speech ROS otherwise negative  PAST MEDICAL HISTORY/PAST SURGICAL HISTORY:  Past Medical History:  Diagnosis Date  . Atrial fibrillation ([place])    permanent  . Backache, unspecified   . Cellulitis and abscess of  leg, except foot   . CHF (congestive heart failure) ([place])   . Diverticulosis 05-2006   Dr. [name]   . Esophageal stricture   . Fatigue   . GERD (gastroesophageal reflux disease)   . Gout, unspecified   . Heart failure   . Hx of colonic polyps 05-2006   (Adenomatous)Dr. [name]  . Hx of radiation therapy 06/16/14- 07/18/14   mult squamous cell carcinomas of scalp  . Hypertension   . Long term (current) use of anticoagulants   . Overweight(278.02)   . Pacemaker- [place] 02/20/2013  . Phlebitis and thrombophlebitis of superficial vessels of lower extremities   . Right bundle branch block   . Skin cancer    squamous cell carcinoma scalp  . Syncope and collapse   . 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 Type II or unspecified type diabetes mellitus without mention of complication, not stated as uncontrolled   . Unspecified venous (peripheral) insufficiency   . Unspecified venous (peripheral) insufficiency     MEDICATIONS:  Prior to Admission medications   Medication Sig Start Date End Date Taking? Authorizing Provider  apixaban (ELIQUIS) 5 MG TABS tablet Take 1 tablet (5 mg total) by mouth daily. 03/21/16   Tiffany L Reed, DO  B-D ULTRAFINE III SHORT PEN 31G X 8 MM MISC USE AS DIRECTED 02/01/17   Tiffany L Reed, DO  diltiazem (CARDIZEM CD) 180 MG 24 hr capsule Take 1 capsule (180 mg total) by mouth daily. 03/21/16  Tiffany L Reed, DO  furosemide (LASIX) 20 MG tablet Take 1 tablet (20 mg total) by mouth every other day. 03/21/16   Tiffany L Reed, DO  gabapentin (NEURONTIN) 100 MG capsule Take 1 capsule (100 mg total) by mouth 2 (two) times daily. 03/21/16   Tiffany L Reed, DO  glucose blood (ACCU-CHEK AVIVA PLUS) test strip Use to test blood sugar three times daily. Dx E11.59 04/29/16   Lauree Chandler, NP  Insulin Glargine (LANTUS SOLOSTAR) 100 UNIT/ML Solostar Pen INJECT 22 UNITS AT BEDTIME 03/21/16   Tiffany L Reed, DO  insulin lispro (HUMALOG KWIKPEN) 100 UNIT/ML KiwkPen INJECT 6 UNITS  BEFORE MEALS-HOLD FOR BLOOD SUGAR LESS THAN 100 08/04/16   Tiffany L Reed, DO  lisinopril (PRINIVIL,ZESTRIL) 10 MG tablet Take 1 tablet (10 mg total) by mouth daily. 03/21/16   Tiffany L Reed, DO  Nutritional Supplements (FEEDING SUPPLEMENT, GLUCERNA 1.2 CAL,) LIQD Take 237 mLs by mouth daily as needed. Reported on 05/25/2016    Historical Provider, MD  omeprazole (PRILOSEC) 20 MG capsule TAKE (1) CAPSULE DAILY. 01/25/17   Tiffany L Reed, DO  Oxycodone HCl 10 MG TABS Take 1 tablet (10 mg total) by mouth every 8 (eight) hours as needed. For pain 02/21/17   Estill Dooms, MD  triamcinolone cream (KENALOG) 0.1 % APPLY TO AFFECTED AREA TWICE A DAY. 01/17/17   Estill Dooms, MD    ALLERGIES:  Allergies  Allergen Reactions  . Morphine Rash and Other (See Comments)    Irritability also    SOCIAL HISTORY:  Social History  Substance Use Topics  . Smoking status: Former Research scientist (life sciences)  . Smokeless tobacco: Never Used     Comment: Quit at age 36   . Alcohol use No     Comment: none currenlty    FAMILY HISTORY: Family History  Problem Relation Age of Onset  . Pneumonia Mother   . Heart disease Father   . Diabetes Father   . Colon cancer Neg Hx     EXAM: BP (!) 118/58 (BP Location: Right Arm)   Pulse 92   Temp 97.5 F (36.4 C) (Oral)   Resp 17   Wt 180 lb (81.6 kg)   SpO2 96%   BMI 25.83 kg/m  CONSTITUTIONAL: Alert and oriented 3 and responds appropriately to questions. Well-appearing; well-nourished, elderly, pleasant, afebrile, no distress HEAD: Normocephalic EYES: Conjunctivae clear, pupils appear equal, EOMI ENT: normal nose; moist mucous membranes NECK: Supple, no meningismus, no nuchal rigidity, no LAD  CARD: RRR; S1 and S2 appreciated; no murmurs, no clicks, no rubs, no gallops RESP: Normal chest excursion without splinting or tachypnea; breath sounds clear and equal bilaterally; no wheezes, no rhonchi, no rales, no hypoxia or respiratory distress, speaking full sentences ABD/GI: Normal  bowel sounds; non-distended; soft, non-tender, no rebound, no guarding, no peritoneal signs, no hepatosplenomegaly BACK:  The back appears normal and is non-tender to palpation, there is no CVA tenderness EXT: Patient is status post right AKA. He has chronic superficial wound involving the left great toe that has been partially dictated. There is a small amount of granulation tissue and white/yellow appearing fluid but no significant erythema, warmth, foul odor. He has a 2+ DP pulse of the right foot. Normal ROM in all joints; non-tender to palpation; no edema; normal capillary refill; no cyanosis, no calf tenderness or swelling    SKIN: Normal color for age and race; warm; no rash NEURO: Moves all extremities equally; no pronator drift, sensation to light  touch intact diffusely, cranial nerves II through XII intact, normal speech PSYCH: The patient's mood and manner are appropriate. Grooming and personal hygiene are appropriate.  MEDICAL DECISION MAKING: Patient here complains of weakness and feeling like he could not move tonight. He was found to be hypoglycemic and once this was corrected he reports this is better. He does report last week he had fever and cough but also reports this is improving. Lungs are now clear. Bones of the left great toe is something that is chronic and not significantly worse. He is neurologically intact, present, without any complaints currently. Will check labs, chest x-ray, urine, head CT to assure there is no other organic cause that may have led him to be hypoglycemic or altered. We'll encourage patient to eat and drink and continue to monitor his blood sugar here. If workup is unremarkable, anticipate that patient will be discharged home and he is comfortable with this plan.  ED PROGRESS: 6:30 AM  Patient's blood sugar initially was 99. He is slowly eating and drinking. Repeat blood sugar 1 hour later is 83. Encouraged him to eat and drink further. Given another glass of  orange juice and applesauce. Otherwise labs unremarkable. He has chronic kidney disease which is unchanged. CT of his head shows no acute abnormality. Chest x-ray shows linear atelectasis but no infiltrate or edema. Patient attempting to urinate. We will continue to encourage oral fluids and food. Suspect his episode today was from hypoglycemia.  7:30 AM  Pt's urine is unremarkable. Patient's repeat blood glucose is 118.  I feel patient safe to be discharged home with his wife. Have recommended he check his blood sugar multiple times a day and continue to drink juice when he gets home. Discussed return precautions. Patient and wife are comfortable with this plan.  At this time, I do not feel there is any life-threatening condition present. I have reviewed and discussed all results (EKG, imaging, lab, urine as appropriate) and exam findings with patient/family. I have reviewed nursing notes and appropriate previous records.  I feel the patient is safe to be discharged home without further emergent workup and can continue workup as an outpatient as needed. Discussed usual and customary return precautions. Patient/family verbalize understanding and are comfortable with this plan.  Outpatient follow-up has been provided if needed. All questions have been answered.    EKG Interpretation  Date/Time:  Monday March 06 2017 04:34:19 EDT Ventricular Rate:  91 PR Interval:    QRS Duration: 141 QT Interval:  402 QTC Calculation: 495 R Axis:   24 Text Interpretation:  Atrial fibrillation Right bundle branch block Baseline wander in lead(s) I V1 No significant change since last tracing Confirmed by WARD,  DO, KRISTEN [phone]) on 03/06/2017 5:00:58 AM         [place], DO 03/06/17 [id number]

## 2017-03-06 NOTE — ED Triage Notes (Signed)
Pt in from home via [facility] with c/o generalized weakness, "coughing up brown sputum x 3 days" and L great toe pain. Pt states he has had trouble sleeping past few nights, denies fevers, n/v, cp or sob. When EMS arrived, CBG was 55 and 96 after OJ. Hx of Afib, R AKA, pacemaker, etc. BP was 110/70 for EMS. L big toe noted to have some drainage, worse per pt. Alert, VSS, NAD

## 2017-03-06 NOTE — ED Notes (Signed)
Patient transported to X-ray 

## 2017-03-09 ENCOUNTER — Telehealth: Payer: Self-pay

## 2017-03-09 NOTE — Telephone Encounter (Signed)
I realize it's not convenient for him to come in, but he also missed his last visit with me.  I'm concerned b/c of the fevers he reported prior to his ED visit.  I'd like him to come Monday with his CBG readings and machine along so we can determine if any insulin changes are needed.

## 2017-03-09 NOTE — Telephone Encounter (Signed)
Patient called requesting to speak with Dr.Reed or Janett Billow. Patient went to the ER with a blood sugar of 53 and a near syncope episode on 03/06/17   Patient is bruising  easily and has concerns with his blood sugar medication. Patient states he will adjust to his medication daily based on his blood sugar reading. Patient could not give me any specifics, patient states he has not taken insulin as listed on medication list in over 8 months. Yesterday patient took 9 units of Lantus and the day before he took 11 units.   Patient states Janett Billow, NP is aware of this and that's why he would prefer that Dr.Reed or Janett Billow call him directly and he not have to go through a middle man.  Patient scheduled appointment for Monday with Dr.Reed for a ER follow-up, patient states he would prefer not to come in and handle his concerns via telephone call. Patient over-talked me when I tried to explain the importance of having an office visit.

## 2017-03-09 NOTE — Telephone Encounter (Signed)
Spoke with patient, patient verbalized understanding of Dr.Reed and Jessica's response

## 2017-03-09 NOTE — Telephone Encounter (Signed)
Agree with Dr Reed.  

## 2017-03-13 ENCOUNTER — Ambulatory Visit (INDEPENDENT_AMBULATORY_CARE_PROVIDER_SITE_OTHER): Payer: Medicare Other | Admitting: Internal Medicine

## 2017-03-13 VITALS — BP 110/60 | HR 70 | Temp 98.2°F

## 2017-03-13 DIAGNOSIS — L97401 Non-pressure chronic ulcer of unspecified heel and midfoot limited to breakdown of skin: Secondary | ICD-10-CM

## 2017-03-13 DIAGNOSIS — I10 Essential (primary) hypertension: Secondary | ICD-10-CM

## 2017-03-13 DIAGNOSIS — I481 Persistent atrial fibrillation: Secondary | ICD-10-CM | POA: Diagnosis not present

## 2017-03-13 DIAGNOSIS — T148XXA Other injury of unspecified body region, initial encounter: Secondary | ICD-10-CM | POA: Diagnosis not present

## 2017-03-13 DIAGNOSIS — E162 Hypoglycemia, unspecified: Secondary | ICD-10-CM

## 2017-03-13 DIAGNOSIS — E11621 Type 2 diabetes mellitus with foot ulcer: Secondary | ICD-10-CM

## 2017-03-13 DIAGNOSIS — I4819 Other persistent atrial fibrillation: Secondary | ICD-10-CM

## 2017-03-13 LAB — CBC WITH DIFFERENTIAL/PLATELET
Basophils Absolute: 77 cells/uL (ref 0–200)
Basophils Relative: 1 %
Eosinophils Absolute: 231 cells/uL (ref 15–500)
Eosinophils Relative: 3 %
HCT: 33.6 % — ABNORMAL LOW (ref 38.5–50.0)
Hemoglobin: 11 g/dL — ABNORMAL LOW (ref 13.2–17.1)
Lymphocytes Relative: 18 %
Lymphs Abs: 1386 cells/uL (ref 850–3900)
MCH: 27.4 pg (ref 27.0–33.0)
MCHC: 32.7 g/dL (ref 32.0–36.0)
MCV: 83.8 fL (ref 80.0–100.0)
MPV: 10.2 fL (ref 7.5–12.5)
Monocytes Absolute: 462 cells/uL (ref 200–950)
Monocytes Relative: 6 %
Neutro Abs: 5544 cells/uL (ref 1500–7800)
Neutrophils Relative %: 72 %
Platelets: 229 10*3/uL (ref 140–400)
RBC: 4.01 MIL/uL — ABNORMAL LOW (ref 4.20–5.80)
RDW: 14.7 % (ref 11.0–15.0)
WBC: 7.7 10*3/uL (ref 3.8–10.8)

## 2017-03-13 LAB — COMPLETE METABOLIC PANEL WITH GFR
ALT: 7 U/L — ABNORMAL LOW (ref 9–46)
AST: 11 U/L (ref 10–35)
Albumin: 3.5 g/dL — ABNORMAL LOW (ref 3.6–5.1)
Alkaline Phosphatase: 78 U/L (ref 40–115)
BUN: 22 mg/dL (ref 7–25)
CO2: 23 mmol/L (ref 20–31)
Calcium: 9 mg/dL (ref 8.6–10.3)
Chloride: 103 mmol/L (ref 98–110)
Creat: 1.77 mg/dL — ABNORMAL HIGH (ref 0.70–1.18)
GFR, Est African American: 41 mL/min — ABNORMAL LOW (ref >=60)
GFR, Est Non African American: 36 mL/min — ABNORMAL LOW (ref >=60)
Glucose, Bld: 253 mg/dL — ABNORMAL HIGH (ref 65–99)
Potassium: 4.4 mmol/L (ref 3.5–5.3)
Sodium: 135 mmol/L (ref 135–146)
Total Bilirubin: 0.6 mg/dL (ref 0.2–1.2)
Total Protein: 6.6 g/dL (ref 6.1–8.1)

## 2017-03-13 MED ORDER — APIXABAN 5 MG PO TABS: 5.0000 mg | ORAL_TABLET | Freq: Two times a day (BID) | ORAL | 3 refills | Status: DC

## 2017-03-13 MED ORDER — INSULIN GLARGINE 100 UNIT/ML SOLOSTAR PEN: 10.0000 [IU] | PEN_INJECTOR | Freq: Every day | SUBCUTANEOUS | 5 refills | Status: DC

## 2017-03-13 MED ORDER — INSULIN LISPRO 100 UNIT/ML (KWIKPEN): PEN_INJECTOR | SUBCUTANEOUS | 5 refills | Status: DC

## 2017-03-13 NOTE — Progress Notes (Signed)
Location:  [facility] clinic Provider: Jaleeya Mcnelly L. Mariea Clonts, D.O., C.M.D.  Code Status: DNR Goals of Care:  Advanced Directives 03/13/2017  Does Patient Have a Medical Advance Directive? Yes  Type of Paramedic of [place];Out of facility DNR (pink MOST or yellow form)  Does patient want to make changes to medical advance directive? -  Copy of [place] in Chart? Yes  Pre-existing out of facility DNR order (yellow form or pink MOST form) -   Chief Complaint  Patient presents with  . Acute Visit    ER follow-up    HPI: Patient is a 82 y.o. male seen today for an acute visit for hypoglycemia.   Tries not to go beyond 5 hrs without food.  Eats cereal with blueberries with another fruit for breakfast with half milk and half cream.  Will have a sandwich or biscuit for late afternoon snack.  He had had friends over on 4/15 and mostly ate "snacky snacks" through the day rather than actual meals, did have some wine.  He had slept that day until 1:45pm, then got up and started this day.  He went to bed at midnight and took 22 units of lantus at that time.  He awoke at 240am and took his sugar and it was 55.  Somehow his wife found him unarousable at first.  Got him awake.  He took 2 8oz glasses of OJ and ate some hard candy.  EMS came and took him to the ED.  There his glucose was even lower at 53.  At 430am, it was 11.  He was sent home at 1050am at which point it was up to 224 again.    Tues 1:14pm  4:30pm 257 6 units humalog 6:10pm 117  Not taking insulin earlier b/c he's not been needing it 8:15pm 105 11:45pm 212 2am 224 11 units lantus  Wed: 1pm 94 4pm 223 4 units humalog 8pm 200 1230 187 11 units lantus  Thurs 1:45pm 110 45:30 203 67 at 7:25 ate cake 7:55 76 6oz oj 9:15 128 1230 225 11 units lantus  Fri 210pm 107 6pm 225 4 units humalog 1050pm 126 150 230 11 units  Sat 102 at 1:45 228 at 4:15 6 units humalog 615 was 164 9pm 195 11pm  191 2am 214 11 units lantus  Sun 130 133 247 at 6:15 6 units humalog 9pm 118 226am 9 units lantus  Mon 1240pm 125  Says he had an upper respiratory infection before he went to the hospital also.  I'm not sure how he was dealing with his insulin doses being lantus 22 at hs and humalog 6 with meals.  Appears the humalog was clearly dropping his sugars too much and he was probably not eating enough for that much insulin.  He says he was using 9 or 11 units or lantus and 4 or 6 of humalog.  Needs hba1c.  I was concerned that his temps he reported before the ED visit could be due to osteomyelitis developing in his great toe with the left diabetic ulcer, but he adamantly refused to let me see it today saying it is fine and his URI was responsible for the fevers.  He is following with podiatry about the toe and has historically been seen with drainage-soaked socks, missing dressings and overall poor hygiene in terms of managing his wound at home.      Past Medical History:  Diagnosis Date  . Atrial fibrillation ([place])  permanent  . Backache, unspecified   . Cellulitis and abscess of leg, except foot   . CHF (congestive heart failure) ([place])   . Diverticulosis 05-2006   Dr. [name]   . Esophageal stricture   . Fatigue   . GERD (gastroesophageal reflux disease)   . Gout, unspecified   . Heart failure   . Hx of colonic polyps 05-2006   (Adenomatous)Dr. [name]  . Hx of radiation therapy 06/16/14- 07/18/14   mult squamous cell carcinomas of scalp  . Hypertension   . Long term (current) use of anticoagulants   . Overweight(278.02)   . Pacemaker- [place] 02/20/2013  . Phlebitis and thrombophlebitis of superficial vessels of lower extremities   . Right bundle branch block   . Skin cancer    squamous cell carcinoma scalp  . Syncope and collapse   . Tachycardia-bradycardia ([place])    s/p PPM  . Type II or unspecified type diabetes mellitus without mention of complication, not stated as  uncontrolled   . Unspecified venous (peripheral) insufficiency   . Unspecified venous (peripheral) insufficiency     Past Surgical History:  Procedure Laterality Date  . ABOVE KNEE LEG AMPUTATION Right 04/02/2014   DR DUDA  . AMPUTATION Right 01/20/2014   Procedure: AMPUTATION BELOW KNEE- right;  Surgeon: [name], MD;  Location: [place];  Service: Orthopedics;  Laterality: Right;  Right Below Knee Amputation  . AMPUTATION Right 02/14/2014   Procedure: AMPUTATION BELOW KNEE;  Surgeon: [name], MD;  Location: [place];  Service: Orthopedics;  Laterality: Right;  Right Below Knee Amputation Revision  . AMPUTATION Right 03/07/2014   Procedure: AMPUTATION BELOW KNEE;  Surgeon: [name], MD;  Location: [place];  Service: Orthopedics;  Laterality: Right;  Right Below Knee Amputation Revision  . AMPUTATION Right 03/18/2014   Procedure: revision of  AMPUTATION BELOW KNEE;  Surgeon: [name], MD;  Location: [place];  Service: Orthopedics;  Laterality: Right;  Revision Right Below Knee Amputation  . AMPUTATION Right 04/02/2014   Procedure: AMPUTATION ABOVE KNEE;  Surgeon: [name], MD;  Location: [place];  Service: Orthopedics;  Laterality: Right;  Right Above Knee Amputation  . EYE SURGERY    . I&D EXTREMITY Right 01/17/2014   Procedure: IRRIGATION AND DEBRIDEMENT RIGHT HEEL  WITH CULTURES AND BONE BIOPSY, placement of wound vac;  Surgeon: Theodoro Kos, DO;  Location: [place];  Service: Plastics;  Laterality: Right;  . LAMINECTOMY    . LUMBAR FUSION    . PACEMAKER INSERTION  8/12   SJM by [name] for tachy/brady syndrome  . ROTATOR CUFF REPAIR    . ruptured rt rectus muscle    . SKIN BIOPSY     scalp    Allergies  Allergen Reactions  . Morphine Rash and Other (See Comments)    Irritability also    Allergies as of 03/13/2017      Reactions   Morphine Rash, Other (See Comments)   Irritability also      Medication List       Accurate as of 03/13/17  2:20 PM. Always use your  most recent med list.          apixaban 5 MG Tabs tablet Commonly known as:  ELIQUIS Take 1 tablet (5 mg total) by mouth daily.   B-D ULTRAFINE III SHORT PEN 31G X 8 MM Misc Generic drug:  Insulin Pen Needle USE AS DIRECTED   diltiazem 180 MG 24 hr capsule  Commonly known as:  CARDIZEM CD Take 1 capsule (180 mg total) by mouth daily.   feeding supplement (GLUCERNA 1.2 CAL) Liqd Take 237 mLs by mouth every 30 (thirty) days. Reported on 05/25/2016   furosemide 20 MG tablet Commonly known as:  LASIX Take 1 tablet (20 mg total) by mouth every other day.   gabapentin 100 MG capsule Commonly known as:  NEURONTIN Take 1 capsule (100 mg total) by mouth 2 (two) times daily.   glucose blood test strip Commonly known as:  ACCU-CHEK AVIVA PLUS Use to test blood sugar three times daily. Dx E11.59   Insulin Glargine 100 UNIT/ML Solostar Pen Commonly known as:  LANTUS SOLOSTAR INJECT 22 UNITS AT BEDTIME   insulin lispro 100 UNIT/ML KiwkPen Commonly known as:  HUMALOG KWIKPEN INJECT 6 UNITS BEFORE MEALS-HOLD FOR BLOOD SUGAR LESS THAN 100   lisinopril 10 MG tablet Commonly known as:  PRINIVIL,ZESTRIL Take 1 tablet (10 mg total) by mouth daily.   omeprazole 20 MG capsule Commonly known as:  PRILOSEC TAKE (1) CAPSULE DAILY.   Oxycodone HCl 10 MG Tabs Take 1 tablet (10 mg total) by mouth every 8 (eight) hours as needed. For pain       Review of Systems:  Review of Systems  Constitutional: Positive for chills and fever. Negative for malaise/fatigue.  HENT: Positive for hearing loss. Negative for congestion.   Eyes: Negative for blurred vision.       Glasses  Respiratory: Negative for cough and shortness of breath.        Had URI that has since resolved  Cardiovascular: Negative for chest pain, palpitations and leg swelling.  Gastrointestinal: Negative for abdominal pain.  Genitourinary: Negative for dysuria.  Musculoskeletal: Negative for falls.       Right aka, left great  toe ulcer  Skin: Negative for itching and rash.  Neurological: Negative for dizziness and weakness.  Endo/Heme/Allergies:       Hypoglycemia, diabetes  Psychiatric/Behavioral: Negative for memory loss.    Health Maintenance  Topic Date Due  . TETANUS/TDAP  11/22/2011  . OPHTHALMOLOGY EXAM  10/22/2015  . FOOT EXAM  03/14/2017  . HEMOGLOBIN A1C  04/19/2017  . INFLUENZA VACCINE  06/21/2017  . PNA vac Low Risk Adult  Completed    Physical Exam: Vitals:   03/13/17 1403  BP: 110/60  Pulse: 70  Temp: 98.2 F (36.8 C)  TempSrc: Oral  SpO2: 98%   There is no height or weight on file to calculate BMI. Physical Exam  Constitutional: He is oriented to person, place, and time. He appears well-developed and well-nourished. No distress.  Cardiovascular: Normal heart sounds and intact distal pulses.   irreg irreg  Pulmonary/Chest: Effort normal and breath sounds normal. No respiratory distress.  Musculoskeletal:  Right AKA, uses standard w/c to get around  Neurological: He is alert and oriented to person, place, and time.  Skin: Skin is warm and dry.  Left great toe ulcer that he refuses to let me examine today  Psychiatric: He has a normal mood and affect.    Labs reviewed: Basic Metabolic Panel:  Recent Labs  07/18/16 1650 10/20/16 1544 03/06/17 0511  NA 136 135 136  K 4.9 4.7 3.8  CL 103 101 99*  CO2 22 27 26   GLUCOSE 162* 296* 99  BUN 24 26* 15  CREATININE 1.55* 1.63* 1.72*  CALCIUM 9.1 9.0 9.1   Liver Function Tests:  Recent Labs  03/06/17 0511  AST 19  ALT 9*  ALKPHOS 77  BILITOT 0.5  PROT 6.8  ALBUMIN 3.2*   No results for input(s): LIPASE, AMYLASE in the last 8760 hours. No results for input(s): AMMONIA in the last 8760 hours. CBC:  Recent Labs  07/18/16 1650 10/20/16 1544 03/06/17 0511  WBC 8.1 7.8 9.7  NEUTROABS 5,913 5,772 7.7  HGB 11.4* 11.3* 11.1*  HCT 34.4* 34.7* 34.0*  MCV 82.1 82.8 83.1  PLT 202 206 204   Lipid Panel: No results  for input(s): CHOL, HDL, LDLCALC, TRIG, CHOLHDL, LDLDIRECT in the last 8760 hours. Lab Results  Component Value Date   HGBA1C 6.5 (H) 10/20/2016    Procedures since last visit: Dg Chest 2 View  Result Date: 03/06/2017 CLINICAL DATA:  c/o generalized weakness, "coughing up brown sputum x 3 days" and L great toe pain. Pt states he has had trouble sleeping past few nights. DM, HTN, ex-smoker. Hx pacemaker, CHF, a-fib EXAM: CHEST  2 VIEW COMPARISON:  12/12/2013 FINDINGS: Shallow inspiration with linear atelectasis in the left lung base. Normal heart size and pulmonary vascularity. No focal airspace disease or consolidation in the lungs. No blunting of costophrenic angles. No pneumothorax. Mediastinal contours appear intact. Cardiac pacemaker. Old resection or resorption of the distal right clavicle. Compression of T12 anteriorly, similar prior study. IMPRESSION: Shallow inspiration with linear atelectasis in the left lung base. Electronically Signed   By: Lucienne Capers M.D.   On: 03/06/2017 05:47   Ct Head Wo Contrast  Result Date: 03/06/2017 CLINICAL DATA:  Recent fever and cough. Today with weakness, disorientation, and confusion. EXAM: CT HEAD WITHOUT CONTRAST TECHNIQUE: Contiguous axial images were obtained from the base of the skull through the vertex without intravenous contrast. COMPARISON:  12/11/2012 FINDINGS: Brain: No evidence of acute infarction, hemorrhage, hydrocephalus, extra-axial collection or mass lesion/mass effect. Diffuse cerebral atrophy. Mild ventricular dilatation consistent with central atrophy. Low-attenuation changes in the deep white matter consistent small vessel ischemia. Old lacunar infarcts in the basal ganglia bilaterally and in the right cerebellum. Vascular: No hyperdense vessel or unexpected calcification. Skull: No depressed skull fractures. Sinuses/Orbits: Mucosal thickening and air-fluid levels in both maxillary sinuses, likely inflammatory. Mucosal thickening in  the ethmoid air cells. Mastoid air cells are not opacified. Other: None. IMPRESSION: No acute intracranial abnormalities. Chronic atrophy and small vessel ischemic changes. Mucosal thickening and air-fluid levels in the paranasal sinuses may indicate sinusitis. Electronically Signed   By: Lucienne Capers M.D.   On: 03/06/2017 06:02    Assessment/Plan 1. Hypoglycemia - reduce insulin as follows: 10 units of lantus qhs 4 units of humalog after meals if cbg is >150 - CBC with Differential/Platelet - COMPLETE METABOLIC PANEL WITH GFR - Hemoglobin A1c  2. Diabetic ulcer of midfoot associated with type 2 diabetes mellitus, limited to breakdown of skin, unspecified laterality ([place]) -refused exam so I don't really know how this is doing, but it has been worrisome to me with his history sequential amputations - CBC with Differential/Platelet - COMPLETE METABOLIC PANEL WITH GFR - Hemoglobin A1c  3. Bruising -unsure why he's bruising more besides years on earth and senile purpura -was actually on less eliquis than appropriate so increased to 5mg  po bid today for his afib etiology -will need to reduce to 2.5mg  po bid after he turns 82 yo 06/11/17  4. Persistent atrial fibrillation (H[facility]) -cont eliquis but change to bid dosing for this purpose  5. Essential hypertension -bp well controlled, cont same regimen which includes ace appropriately with his DM   Labs/tests ordered:  Orders Placed This Encounter  Procedures  . CBC with Differential/Platelet  . COMPLETE METABOLIC PANEL WITH GFR  . Hemoglobin A1c   Next appt:  04/24/2017 at which eliquis dose must be changed to 2.5mg  po bid   Jayvyn Haselton L. Turki Tapanes, D.O. [facility] [address], [place] [address] Cell Phone (Mon-Fri 8am-5pm):  [phone] On Call:  [phone] & follow prompts after 5pm & weekends Office Phone:  [phone] Office Fax:  [phone]

## 2017-03-14 LAB — HEMOGLOBIN A1C
Hgb A1c MFr Bld: 6.4 % — ABNORMAL HIGH (ref <5.7)
Mean Plasma Glucose: 137 mg/dL

## 2017-03-16 ENCOUNTER — Ambulatory Visit: Payer: Medicare Other | Admitting: Podiatry

## 2017-03-16 ENCOUNTER — Encounter: Payer: Self-pay | Admitting: *Deleted

## 2017-03-19 ENCOUNTER — Encounter: Payer: Self-pay | Admitting: Internal Medicine

## 2017-03-22 ENCOUNTER — Inpatient Hospital Stay (HOSPITAL_COMMUNITY)
Admission: EM | Admit: 2017-03-22 | Discharge: 2017-03-25 | DRG: 872 | Disposition: A | Discharge status: Home / Self Care | Payer: Medicare Other | Attending: Internal Medicine | Admitting: Internal Medicine

## 2017-03-22 ENCOUNTER — Encounter (HOSPITAL_COMMUNITY): Payer: Self-pay | Admitting: Emergency Medicine

## 2017-03-22 ENCOUNTER — Emergency Department (HOSPITAL_COMMUNITY): Payer: Medicare Other

## 2017-03-22 ENCOUNTER — Other Ambulatory Visit: Payer: Self-pay | Admitting: *Deleted

## 2017-03-22 DIAGNOSIS — A419 Sepsis, unspecified organism: Secondary | ICD-10-CM | POA: Diagnosis not present

## 2017-03-22 DIAGNOSIS — Z886 Allergy status to analgesic agent status: Secondary | ICD-10-CM

## 2017-03-22 DIAGNOSIS — Z89511 Acquired absence of right leg below knee: Secondary | ICD-10-CM | POA: Diagnosis not present

## 2017-03-22 DIAGNOSIS — M7989 Other specified soft tissue disorders: Secondary | ICD-10-CM | POA: Diagnosis not present

## 2017-03-22 DIAGNOSIS — I5032 Chronic diastolic (congestive) heart failure: Secondary | ICD-10-CM | POA: Diagnosis not present

## 2017-03-22 DIAGNOSIS — I10 Essential (primary) hypertension: Secondary | ICD-10-CM

## 2017-03-22 DIAGNOSIS — D72829 Elevated white blood cell count, unspecified: Secondary | ICD-10-CM | POA: Diagnosis not present

## 2017-03-22 DIAGNOSIS — E1151 Type 2 diabetes mellitus with diabetic peripheral angiopathy without gangrene: Secondary | ICD-10-CM | POA: Diagnosis present

## 2017-03-22 DIAGNOSIS — Z950 Presence of cardiac pacemaker: Secondary | ICD-10-CM

## 2017-03-22 DIAGNOSIS — Z85828 Personal history of other malignant neoplasm of skin: Secondary | ICD-10-CM | POA: Diagnosis not present

## 2017-03-22 DIAGNOSIS — L97529 Non-pressure chronic ulcer of other part of left foot with unspecified severity: Secondary | ICD-10-CM | POA: Diagnosis present

## 2017-03-22 DIAGNOSIS — Z794 Long term (current) use of insulin: Secondary | ICD-10-CM | POA: Diagnosis not present

## 2017-03-22 DIAGNOSIS — I482 Chronic atrial fibrillation: Secondary | ICD-10-CM | POA: Diagnosis not present

## 2017-03-22 DIAGNOSIS — Z993 Dependence on wheelchair: Secondary | ICD-10-CM

## 2017-03-22 DIAGNOSIS — Z89611 Acquired absence of right leg above knee: Secondary | ICD-10-CM | POA: Diagnosis not present

## 2017-03-22 DIAGNOSIS — Z981 Arthrodesis status: Secondary | ICD-10-CM | POA: Diagnosis not present

## 2017-03-22 DIAGNOSIS — N179 Acute kidney failure, unspecified: Secondary | ICD-10-CM | POA: Diagnosis not present

## 2017-03-22 DIAGNOSIS — Z885 Allergy status to narcotic agent status: Secondary | ICD-10-CM | POA: Diagnosis not present

## 2017-03-22 DIAGNOSIS — E1122 Type 2 diabetes mellitus with diabetic chronic kidney disease: Secondary | ICD-10-CM | POA: Diagnosis present

## 2017-03-22 DIAGNOSIS — L03116 Cellulitis of left lower limb: Secondary | ICD-10-CM | POA: Diagnosis not present

## 2017-03-22 DIAGNOSIS — Z79899 Other long term (current) drug therapy: Secondary | ICD-10-CM | POA: Diagnosis not present

## 2017-03-22 DIAGNOSIS — K219 Gastro-esophageal reflux disease without esophagitis: Secondary | ICD-10-CM | POA: Diagnosis not present

## 2017-03-22 DIAGNOSIS — N183 Chronic kidney disease, stage 3 unspecified: Secondary | ICD-10-CM | POA: Diagnosis present

## 2017-03-22 DIAGNOSIS — Z87891 Personal history of nicotine dependence: Secondary | ICD-10-CM | POA: Diagnosis not present

## 2017-03-22 DIAGNOSIS — I481 Persistent atrial fibrillation: Secondary | ICD-10-CM

## 2017-03-22 DIAGNOSIS — E0800 Diabetes mellitus due to underlying condition with hyperosmolarity without nonketotic hyperglycemic-hyperosmolar coma (NKHHC): Secondary | ICD-10-CM

## 2017-03-22 DIAGNOSIS — Z923 Personal history of irradiation: Secondary | ICD-10-CM

## 2017-03-22 DIAGNOSIS — I4891 Unspecified atrial fibrillation: Secondary | ICD-10-CM | POA: Diagnosis present

## 2017-03-22 DIAGNOSIS — E11621 Type 2 diabetes mellitus with foot ulcer: Secondary | ICD-10-CM | POA: Diagnosis present

## 2017-03-22 DIAGNOSIS — L0390 Cellulitis, unspecified: Secondary | ICD-10-CM | POA: Diagnosis present

## 2017-03-22 DIAGNOSIS — E119 Type 2 diabetes mellitus without complications: Secondary | ICD-10-CM

## 2017-03-22 DIAGNOSIS — Z7901 Long term (current) use of anticoagulants: Secondary | ICD-10-CM

## 2017-03-22 DIAGNOSIS — I130 Hypertensive heart and chronic kidney disease with heart failure and stage 1 through stage 4 chronic kidney disease, or unspecified chronic kidney disease: Secondary | ICD-10-CM | POA: Diagnosis present

## 2017-03-22 DIAGNOSIS — M79662 Pain in left lower leg: Secondary | ICD-10-CM | POA: Diagnosis not present

## 2017-03-22 LAB — PROTIME-INR
INR: 1.45
Prothrombin Time: 17.7 seconds — ABNORMAL HIGH (ref 11.4–15.2)

## 2017-03-22 LAB — CBC WITH DIFFERENTIAL/PLATELET
Basophils Absolute: 0 10*3/uL (ref 0.0–0.1)
Basophils Relative: 0 %
Eosinophils Absolute: 0.2 10*3/uL (ref 0.0–0.7)
Eosinophils Relative: 1 %
HEMATOCRIT: 37.2 % — AB (ref 39.0–52.0)
HEMOGLOBIN: 12.3 g/dL — AB (ref 13.0–17.0)
LYMPHS PCT: 8 %
Lymphs Abs: 1.9 10*3/uL (ref 0.7–4.0)
MCH: 27 pg (ref 26.0–34.0)
MCHC: 33.1 g/dL (ref 30.0–36.0)
MCV: 81.8 fL (ref 78.0–100.0)
Monocytes Absolute: 0.7 10*3/uL (ref 0.1–1.0)
Monocytes Relative: 3 %
NEUTROS PCT: 88 %
Neutro Abs: 20.2 10*3/uL — ABNORMAL HIGH (ref 1.7–7.7)
Platelets: 258 10*3/uL (ref 150–400)
RBC: 4.55 MIL/uL (ref 4.22–5.81)
RDW: 14.9 % (ref 11.5–15.5)
WBC: 23 10*3/uL — ABNORMAL HIGH (ref 4.0–10.5)

## 2017-03-22 LAB — COMPREHENSIVE METABOLIC PANEL
ALT: 16 U/L — ABNORMAL LOW (ref 17–63)
AST: 25 U/L (ref 15–41)
Albumin: 3.8 g/dL (ref 3.5–5.0)
Alkaline Phosphatase: 109 U/L (ref 38–126)
Anion gap: 13 (ref 5–15)
BUN: 26 mg/dL — ABNORMAL HIGH (ref 6–20)
CHLORIDE: 98 mmol/L — AB (ref 101–111)
CO2: 21 mmol/L — AB (ref 22–32)
Calcium: 9.2 mg/dL (ref 8.9–10.3)
Creatinine, Ser: 1.92 mg/dL — ABNORMAL HIGH (ref 0.61–1.24)
GFR, EST AFRICAN AMERICAN: 37 mL/min — AB (ref >60)
GFR, EST NON AFRICAN AMERICAN: 32 mL/min — AB (ref >60)
Glucose, Bld: 199 mg/dL — ABNORMAL HIGH (ref 65–99)
POTASSIUM: 4.3 mmol/L (ref 3.5–5.1)
Sodium: 132 mmol/L — ABNORMAL LOW (ref 135–145)
Total Bilirubin: 0.8 mg/dL (ref 0.3–1.2)
Total Protein: 7.9 g/dL (ref 6.5–8.1)

## 2017-03-22 LAB — I-STAT CG4 LACTIC ACID, ED
Lactic Acid, Venous: 1.98 mmol/L (ref 0.5–1.9)
Lactic Acid, Venous: 1.45 mmol/L (ref 0.5–1.9)

## 2017-03-22 LAB — CBG MONITORING, ED: GLUCOSE-CAPILLARY: 198 mg/dL — AB (ref 65–99)

## 2017-03-22 MED ORDER — VANCOMYCIN HCL IN DEXTROSE 1-5 GM/200ML-% IV SOLN: 1000.0000 mg | Freq: Once | INTRAVENOUS | Status: DC

## 2017-03-22 MED ORDER — VANCOMYCIN HCL 10 G IV SOLR
1750.0000 mg | Freq: Once | INTRAVENOUS | Status: AC
  Administered 2017-03-22: 1750 mg via INTRAVENOUS
  Filled 2017-03-22: qty 1750

## 2017-03-22 MED ORDER — OXYCODONE HCL 10 MG PO TABS: 10.0000 mg | ORAL_TABLET | Freq: Three times a day (TID) | ORAL | 0 refills | Status: DC | PRN

## 2017-03-22 MED ORDER — ACETAMINOPHEN 325 MG PO TABS
650.0000 mg | ORAL_TABLET | Freq: Once | ORAL | Status: AC
  Administered 2017-03-22: 650 mg via ORAL
  Filled 2017-03-22: qty 2

## 2017-03-22 MED ORDER — VANCOMYCIN HCL 10 G IV SOLR
1250.0000 mg | INTRAVENOUS | Status: DC
  Administered 2017-03-23 – 2017-03-24 (×2): 1250 mg via INTRAVENOUS
  Filled 2017-03-22 (×3): qty 1250

## 2017-03-22 MED ORDER — PIPERACILLIN-TAZOBACTAM 3.375 G IVPB
3.3750 g | Freq: Three times a day (TID) | INTRAVENOUS | Status: DC
  Administered 2017-03-23 – 2017-03-25 (×8): 3.375 g via INTRAVENOUS
  Filled 2017-03-22 (×10): qty 50

## 2017-03-22 MED ORDER — FENTANYL CITRATE (PF) 100 MCG/2ML IJ SOLN: 25.0000 ug | Freq: Once | INTRAMUSCULAR | Status: DC

## 2017-03-22 MED ORDER — SODIUM CHLORIDE 0.9 % IV BOLUS (SEPSIS)
1000.0000 mL | Freq: Once | INTRAVENOUS | Status: AC
  Administered 2017-03-22: 1000 mL via INTRAVENOUS

## 2017-03-22 MED ORDER — PIPERACILLIN-TAZOBACTAM 3.375 G IVPB 30 MIN
3.3750 g | Freq: Once | INTRAVENOUS | Status: AC
  Administered 2017-03-22: 3.375 g via INTRAVENOUS
  Filled 2017-03-22: qty 50

## 2017-03-22 NOTE — Progress Notes (Signed)
Pharmacy Antibiotic Note  [name] is a 82 y.o. male admitted on 03/22/2017 with sepsis 2/2 cellulitis.  Pharmacy has been consulted for vancomycin and Zosyn dosing. Patient is Tmax 100.1, WBC elevated at 23, CrCl ~36 ml/min  Plan: Vancomycin 1750 mg x1 then 1250 mg IV every 24 hours.  Goal trough 15-20 mcg/mL. Zosyn 3.375g IV q8h (4 hour infusion). Monitor renal fxn, cultures, ability to de-escalate     Temp (24hrs), Avg:99.8 F (37.7 C), Min:99.4 F (37.4 C), Max:100.1 F (37.8 C)   Recent Labs Lab 03/22/17 2023 03/22/17 2034  WBC 23.0*  --   CREATININE 1.92*  --   LATICACIDVEN  --  1.98*    CrCl cannot be calculated (Unknown ideal weight.).    Allergies  Allergen Reactions  . Morphine Rash and Other (See Comments)    Irritability also    Antimicrobials this admission: Vanc 5/2>> Zosyn 5/2>>  Dose adjustments this admission: n/a    Thank you for allowing pharmacy to be a part of this patient's care.   Gwenlyn Perking, PharmD PGY1 Pharmacy Resident Pager: [phone] 03/22/2017 10:59 PM

## 2017-03-22 NOTE — ED Notes (Signed)
Admitting MD at bedside.

## 2017-03-22 NOTE — Telephone Encounter (Signed)
Patient requested and will pick up 

## 2017-03-22 NOTE — H&P (Signed)
History and Physical    [name] GYJ:[id number] DOB: [birth year] DOA: 03/22/2017  PCP: [name], DO Consultants:  [place]; Caryl Comes - cardiology; Sharol Given - orthopedics; Scot Dock - vascular; [place] - dermatology; Lovena Le - eye Patient coming from: home - lives with wife; NOK: wife, [phone]  Chief Complaint: leg pain  HPI: [name] is a 82 y.o. male with medical history significant of PVD s/p R AKA and left great toe necrosis; DM; pacemaker placement for tachy0brady syndrome; afib on Eliquis; and diastolic heart failure presenting with acute onset of pain in left leg between the ankle and knee, started at 2pm.  "It was truly excruciating".  He tried exercise, elevating, mustard - nothing worked.  Called the nurse line, who told him to come in for admission for possible cellulitis.  He began checking his temperature over and over - fever at 2:30, 100.2 -> 100.5 -> 100.9 -> 101.3 -> 101.7.  Then came to the ER.  Sugars 121-164 yesterday.  Lantus 10 units "the official prescription amount" but he "has permission to lower it" as needed.   ED Course:  Code sepsis due to fever, tachycardia, elevated lactate - Vanc/Zosyn for cellulitis, 1L bolus.  Review of Systems: As per HPI; otherwise review of systems reviewed and negative.   Ambulatory Status:  Ambulates in a wheelchair, awaiting prosthesis  Past Medical History:  Diagnosis Date  . Atrial fibrillation ([place])    permanent  . Backache, unspecified   . Cellulitis and abscess of leg, except foot   . CHF (congestive heart failure) ([place])   . Diverticulosis 05-2006   Dr. [name]   . Esophageal stricture   . Fatigue   . GERD (gastroesophageal reflux disease)   . Gout, unspecified   . Hx of colonic polyps 05-2006   (Adenomatous)Dr. [name]  . Hx of radiation therapy 06/16/14- 07/18/14   mult squamous cell carcinomas of scalp  . Hypertension   . Long term (current) use of anticoagulants   . Overweight(278.02)   .  Pacemaker- [place] 02/20/2013  . Phlebitis and thrombophlebitis of superficial vessels of lower extremities   . Right bundle branch block   . Skin cancer    squamous cell carcinoma scalp  . Syncope and collapse   . Tachycardia-bradycardia ([place])    s/p PPM  . Type II or unspecified type diabetes mellitus without mention of complication, not stated as uncontrolled   . Unspecified venous (peripheral) insufficiency     Past Surgical History:  Procedure Laterality Date  . ABOVE KNEE LEG AMPUTATION Right 04/02/2014   DR DUDA  . AMPUTATION Right 01/20/2014   Procedure: AMPUTATION BELOW KNEE- right;  Surgeon: [name], MD;  Location: [facility];  Service: Orthopedics;  Laterality: Right;  Right Below Knee Amputation  . AMPUTATION Right 02/14/2014   Procedure: AMPUTATION BELOW KNEE;  Surgeon: [name], MD;  Location: [facility];  Service: Orthopedics;  Laterality: Right;  Right Below Knee Amputation Revision  . AMPUTATION Right 03/07/2014   Procedure: AMPUTATION BELOW KNEE;  Surgeon: [name], MD;  Location: [facility];  Service: Orthopedics;  Laterality: Right;  Right Below Knee Amputation Revision  . AMPUTATION Right 03/18/2014   Procedure: revision of  AMPUTATION BELOW KNEE;  Surgeon: [name], MD;  Location: [facility];  Service: Orthopedics;  Laterality: Right;  Revision Right Below Knee Amputation  . AMPUTATION Right 04/02/2014   Procedure: AMPUTATION ABOVE KNEE;  Surgeon: [name], MD;  Location:  [facility];  Service: Orthopedics;  Laterality: Right;  Right Above Knee Amputation  . EYE SURGERY    . I&D EXTREMITY Right 01/17/2014   Procedure: IRRIGATION AND DEBRIDEMENT RIGHT HEEL  WITH CULTURES AND BONE BIOPSY, placement of wound vac;  Surgeon: Theodoro Kos, DO;  Location: [facility];  Service: Plastics;  Laterality: Right;  . LAMINECTOMY    . LUMBAR FUSION    . PACEMAKER INSERTION  8/12   SJM by [name] for tachy/brady syndrome  . ROTATOR CUFF REPAIR    . ruptured rt rectus muscle    . SKIN  BIOPSY     scalp    Social History   Social History  . Marital status: Married    Spouse name: [name]  . Number of children: 2  . Years of education: [name]   Occupational History  . Retired Hotel manager    Social History Main Topics  . Smoking status: Former Research scientist (life sciences)  . Smokeless tobacco: Never Used     Comment: Quit at age 41   . Alcohol use No     Comment: none currenlty  . Drug use: No  . Sexual activity: Not Currently   Other Topics Concern  . Not on file   Social History Narrative   2 caffeine drinks daily    Stopped smoking 1963   Married   Exercise bar bells (R) leg amputation   Alcohol none        Allergies  Allergen Reactions  . Morphine Rash and Other (See Comments)    Irritability also    Family History  Problem Relation Age of Onset  . Pneumonia Mother   . Heart disease Father   . Diabetes Father   . Colon cancer Neg Hx     Prior to Admission medications   Medication Sig Start Date End Date Taking? Authorizing Provider  apixaban (ELIQUIS) 5 MG TABS tablet Take 1 tablet (5 mg total) by mouth 2 (two) times daily. 03/13/17   Tiffany L Reed, DO  B-D ULTRAFINE III SHORT PEN 31G X 8 MM MISC USE AS DIRECTED 02/01/17   Tiffany L Reed, DO  diltiazem (CARDIZEM CD) 180 MG 24 hr capsule Take 1 capsule (180 mg total) by mouth daily. 03/21/16   Tiffany L Reed, DO  furosemide (LASIX) 20 MG tablet Take 1 tablet (20 mg total) by mouth every other day. 03/21/16   Tiffany L Reed, DO  gabapentin (NEURONTIN) 100 MG capsule Take 1 capsule (100 mg total) by mouth 2 (two) times daily. 03/21/16   Tiffany L Reed, DO  glucose blood (ACCU-CHEK AVIVA PLUS) test strip Use to test blood sugar three times daily. Dx E11.59 04/29/16   Lauree Chandler, NP  Insulin Glargine (LANTUS SOLOSTAR) 100 UNIT/ML Solostar Pen Inject 10 Units into the skin at bedtime. 03/13/17   Tiffany L Reed, DO  insulin lispro (HUMALOG KWIKPEN) 100 UNIT/ML KiwkPen INJECT 4 UNITS after MEALS for glucose over 150 03/13/17    Tiffany L Reed, DO  lisinopril (PRINIVIL,ZESTRIL) 10 MG tablet Take 1 tablet (10 mg total) by mouth daily. 03/21/16   Tiffany L Reed, DO  Nutritional Supplements (FEEDING SUPPLEMENT, GLUCERNA 1.2 CAL,) LIQD Take 237 mLs by mouth every 30 (thirty) days. Reported on 05/25/2016    Historical Provider, MD  omeprazole (PRILOSEC) 20 MG capsule TAKE (1) CAPSULE DAILY. 01/25/17   Tiffany L Reed, DO  Oxycodone HCl 10 MG TABS Take 1 tablet (10 mg total) by mouth every 8 (eight) hours as needed.  For pain 03/22/17   Estill Dooms, MD    Physical Exam: Vitals:   03/22/17 2230 03/22/17 2300 03/22/17 2315 03/22/17 2330  BP: (!) 127/53 (!) 102/43 (!) 109/40 (!) 103/40  Pulse: (!) 105 99 (!) 102 97  Resp: 18 16 20 12   Temp:      TempSrc:      SpO2: 97% 95% 96% 96%     General:  Appears calm and comfortable and is NAD; quite conversant Eyes:  PERRL, EOMI, normal lids, iris ENT:  grossly normal hearing, lips & tongue, mmm Neck:  no LAD, masses or thyromegaly Cardiovascular:  RRR, no m/r/g. No LE edema.  Respiratory:  CTA bilaterally, no w/r/r. Normal respiratory effort. Abdomen:  soft, ntnd, NABS Skin:  no rash or induration seen on limited exam other than as below Musculoskeletal:  s/p R AKA.  Left great toe appears to have had a distal amputation but the patient description is that it was actually a necrotic auto-amputation; there is still crust present on the distal toe edge.  Diffuse erythema with crusting along the anterior calf without obvious purulence. Psychiatric:  grossly normal mood and affect, speech fluent and appropriate, AOx3 Neurologic:  CN 2-12 grossly intact, moves all extremities in coordinated fashion, sensation intact  Labs on Admission: I have personally reviewed following labs and imaging studies  CBC:  Recent Labs Lab 03/22/17 2023  WBC 23.0*  NEUTROABS 20.2*  HGB 12.3*  HCT 37.2*  MCV 81.8  PLT 250   Basic Metabolic Panel:  Recent Labs Lab 03/22/17 2023  NA 132*  K  4.3  CL 98*  CO2 21*  GLUCOSE 199*  BUN 26*  CREATININE 1.92*  CALCIUM 9.2   GFR: CrCl cannot be calculated (Unknown ideal weight.). Liver Function Tests:  Recent Labs Lab 03/22/17 2023  AST 25  ALT 16*  ALKPHOS 109  BILITOT 0.8  PROT 7.9  ALBUMIN 3.8   No results for input(s): LIPASE, AMYLASE in the last 168 hours. No results for input(s): AMMONIA in the last 168 hours. Coagulation Profile:  Recent Labs Lab 03/22/17 2048  INR 1.45   Cardiac Enzymes: No results for input(s): CKTOTAL, CKMB, CKMBINDEX, TROPONINI in the last 168 hours. BNP (last 3 results) No results for input(s): PROBNP in the last 8760 hours. HbA1C: No results for input(s): HGBA1C in the last 72 hours. CBG:  Recent Labs Lab 03/22/17 2314  GLUCAP 198*   Lipid Profile: No results for input(s): CHOL, HDL, LDLCALC, TRIG, CHOLHDL, LDLDIRECT in the last 72 hours. Thyroid Function Tests: No results for input(s): TSH, T4TOTAL, FREET4, T3FREE, THYROIDAB in the last 72 hours. Anemia Panel: No results for input(s): VITAMINB12, FOLATE, FERRITIN, TIBC, IRON, RETICCTPCT in the last 72 hours. Urine analysis:    Component Value Date/Time   COLORURINE YELLOW 03/06/2017 [place] 03/06/2017 0642   LABSPEC 1.008 03/06/2017 0642   PHURINE 7.0 03/06/2017 0642   GLUCOSEU 50 (A) 03/06/2017 0642   HGBUR NEGATIVE 03/06/2017 0642   BILIRUBINUR NEGATIVE 03/06/2017 0642   BILIRUBINUR n 03/01/2013 1613   KETONESUR NEGATIVE 03/06/2017 0642   PROTEINUR 100 (A) 03/06/2017 0642   UROBILINOGEN 0.2 01/16/2014 2042   NITRITE NEGATIVE 03/06/2017 0642   LEUKOCYTESUR NEGATIVE 03/06/2017 0642    Creatinine Clearance: CrCl cannot be calculated (Unknown ideal weight.).  Sepsis Labs: @LABRCNTIP (procalcitonin:4,lacticidven:4) )No results found for this or any previous visit (from the past 240 hour(s)).   Radiological Exams on Admission: Dg Tibia/fibula Left  Result Date: 03/22/2017  CLINICAL DATA:  Pain  and swelling for 1 day EXAM: LEFT TIBIA AND FIBULA - 2 VIEW COMPARISON:  None. FINDINGS: Meniscal calcifications are noted in the knee joint. No acute fracture or dislocation is seen. No soft tissue abnormality is noted. IMPRESSION: No acute abnormality noted. Electronically Signed   By: [name] M.D.   On: 03/22/2017 21:41    EKG: Independently reviewed.  Afib with rate 123; RBBB, nonspecific ST changes with no evidence of acute ischemia  Assessment/Plan Principal Problem:   Sepsis ([place]) Active Problems:   Essential hypertension   Atrial fibrillation ([facility])   Diabetes mellitus ([place])   Cellulitis   CKD (chronic kidney disease), stage III   Sepsis related to cellulitis -Elevated WBC count (23.0), fever at home, tachycardia with elevated lactate to 1.98, 1.45 -While awaiting blood cultures, this appears to be a preseptic condition. -Sepsis protocol initiated; has did not receive the full IVF bolus due to lactate <4 -Patient with skin sloughing and breakdown as well as stasis changes -Erythema along the left anterior lower leg consistent with cellulitis, which is most likely the source -He was given Vanc and Zosyn in the ER, will continue -Blood cultures pending -Will admit with telemetry and continue to monitor -Will trend lactate to ensure improvement and check procalcitonin -He has already had 1 amputation and surely would like to avoid another, but the toe on his left foot does appear chronically infected/necrotic; imaging might be considered.  DPM note from 3/15 indicates that this is being monitored on an ongoing basis as an outpatient.  DM -Glucose 198 -A1c was 6.4 on 4/23 so he has reasonable control -Continue home insulin as well as SSI  Afib on Eliquis -INR 1.45 -Mild tachycardia which is likely related to infection/fever -CHA2DS2-VASc Score is 6, average stroke rate/year is 9.8% -Continue rate control with Cardizem for now  CKD -BUN 26/Creatinine 1.92/GFR 32 -  baseline appears to be 1.5-1.6 -Na 132 -CO2 21 -Replete with IVF and monitor -Hold nephrotoxic agents and NSAIDs as much as possible  HTN -Hold Lisinopril with possible mild AKI right now -Continue Cardizem    DVT prophylaxis: Eliquis Code Status:  Full - confirmed with patient Family Communication: None present  Disposition Plan:  Home once clinically improved Consults called: None  Admission status: Admit - It is my clinical opinion that admission to INPATIENT is reasonable and necessary because this patient will require at least 2 midnights in the hospital to treat this condition based on the medical complexity of the problems presented.  Given the aforementioned information, the predictability of an adverse outcome is felt to be significant.    Karmen Bongo MD [facility]  If 7PM-7AM, please contact night-coverage [email or web address] Password [email or web address]  03/23/2017, 12:17 AM

## 2017-03-22 NOTE — ED Provider Notes (Signed)
[facility] DEPT Provider Note   CSN: [id number] Arrival date & time: 03/22/17  2000     History   Chief Complaint Chief Complaint  Patient presents with  . Recurrent Skin Infections  . Fever    HPI [name] is a 82 y.o. male.  HPI  82 y.o. male with a hx of Atrial Fibrillation on Eliquis, CHD, HTN, DM2, St. Jude's Pacemaker, presents to the Emergency Department today complaining of redness to left leg that started today. NOtes pinkish color last night, but worsened this morning. Subjective fevers. No N/V. No CP/SOB/ABD pain. No numbness/tingling. Pt states that there is no purulence from the leg. Notes pain in leg 8/10. Worse with palpation. Denies swelling. Pt currently on Eliquis for Atrial Fibrillation. Noted hx of right AKA. No other symptoms noted.   Past Medical History:  Diagnosis Date  . Atrial fibrillation ([place])    permanent  . Backache, unspecified   . Cellulitis and abscess of leg, except foot   . CHF (congestive heart failure) ([place])   . Diverticulosis 05-2006   Dr. [name]   . Esophageal stricture   . Fatigue   . GERD (gastroesophageal reflux disease)   . Gout, unspecified   . Heart failure   . Hx of colonic polyps 05-2006   (Adenomatous)Dr. [name]  . Hx of radiation therapy 06/16/14- 07/18/14   mult squamous cell carcinomas of scalp  . Hypertension   . Long term (current) use of anticoagulants   . Overweight(278.02)   . Pacemaker- [place] 02/20/2013  . Phlebitis and thrombophlebitis of superficial vessels of lower extremities   . Right bundle branch block   . Skin cancer    squamous cell carcinoma scalp  . Syncope and collapse   . Tachycardia-bradycardia ([place])    s/p PPM  . Type II or unspecified type diabetes mellitus without mention of complication, not stated as uncontrolled   . Unspecified venous (peripheral) insufficiency   . Unspecified venous (peripheral) insufficiency     Patient Active Problem List   Diagnosis Date Noted  .  Diabetic foot ulcer associated with type 2 diabetes mellitus ([place]) 11/24/2014  . 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 Status post above knee amputation of right lower extremity ([place]) 04/08/2014  . PAD (peripheral artery disease) ([place]) 04/08/2014  . Gangrene associated with type 2 diabetes mellitus ([place]) 04/02/2014  . Generalized weakness 03/23/2014  . Wound dehiscence 03/15/2014  . Coagulopathy ([place]) 03/09/2014  . Anemia due to acute blood loss 03/08/2014  . Diabetes mellitus ([place]) 03/02/2014  . Anemia 03/01/2014  . Constipation 02/18/2014  . Insomnia 02/18/2014  . Amputation stump infection ([place]) 02/06/2014  . Hx of right BKA ([place]) 01/28/2014  . Osteomyelitis ([place]) 01/16/2014  . Diabetic osteomyelitis ([place]) 01/09/2014  . Pacemaker- [name] 02/20/2013  . Hyperlipidemia 12/12/2012  . TIA (transient ischemic attack) 12/11/2012  . ETOH abuse 04/02/2012  . ACTINIC KERATOSIS 11/29/2010  . MULTIPLE&UNSPEC OPEN WOUND LOWER LIMB COMP 10/19/2010  . ADHESIVE CAPSULITIS OF SHOULDER 08/30/2010  . ESOPHAGEAL STRICTURE 05/19/2010  . DYSPHAGIA UNSPECIFIED 04/08/2010  . DUPUYTREN'S CONTRACTURE, LEFT 12/21/2009  . CERVICAL STRAIN, ACUTE 08/28/2009  . OVERWEIGHT/OBESITY 04/07/2009  . RBBB 04/07/2009  . GERD 04/07/2009  . SYNCOPE AND COLLAPSE 04/07/2009  . FATIGUE / MALAISE 04/07/2009  . PHLEBITIS&THROMBOPHLEB SUP VESSELS LOWER EXTREM 02/28/2008  . CELLULITIS/ABSCESS, LEG 02/28/2008  . BACK PAIN, CHRONIC 10/31/2007  . Unspecified venous (peripheral) insufficiency 07/02/2007  . GOUT 06/26/2007  .  Essential hypertension 06/26/2007  . Atrial fibrillation ([place]) 06/26/2007  . Chronic diastolic CHF (congestive heart failure) ([place]) 06/26/2007    Past Surgical History:  Procedure Laterality Date  . ABOVE KNEE LEG AMPUTATION Right 04/02/2014   DR DUDA  . AMPUTATION Right 01/20/2014   Procedure: AMPUTATION BELOW KNEE- right;  Surgeon: [name], MD;  Location: [facility];   Service: Orthopedics;  Laterality: Right;  Right Below Knee Amputation  . AMPUTATION Right 02/14/2014   Procedure: AMPUTATION BELOW KNEE;  Surgeon: [name], MD;  Location: [facility];  Service: Orthopedics;  Laterality: Right;  Right Below Knee Amputation Revision  . AMPUTATION Right 03/07/2014   Procedure: AMPUTATION BELOW KNEE;  Surgeon: [name], MD;  Location: [facility];  Service: Orthopedics;  Laterality: Right;  Right Below Knee Amputation Revision  . AMPUTATION Right 03/18/2014   Procedure: revision of  AMPUTATION BELOW KNEE;  Surgeon: [name], MD;  Location: [facility];  Service: Orthopedics;  Laterality: Right;  Revision Right Below Knee Amputation  . AMPUTATION Right 04/02/2014   Procedure: AMPUTATION ABOVE KNEE;  Surgeon: [name], MD;  Location: [place];  Service: Orthopedics;  Laterality: Right;  Right Above Knee Amputation  . EYE SURGERY    . I&D EXTREMITY Right 01/17/2014   Procedure: IRRIGATION AND DEBRIDEMENT RIGHT HEEL  WITH CULTURES AND BONE BIOPSY, placement of wound vac;  Surgeon: Theodoro Kos, DO;  Location: [facility];  Service: Plastics;  Laterality: Right;  . LAMINECTOMY    . LUMBAR FUSION    . PACEMAKER INSERTION  8/12   SJM by [name] for tachy/brady syndrome  . ROTATOR CUFF REPAIR    . ruptured rt rectus muscle    . SKIN BIOPSY     scalp       Home Medications    Prior to Admission medications   Medication Sig Start Date End Date Taking? Authorizing Provider  apixaban (ELIQUIS) 5 MG TABS tablet Take 1 tablet (5 mg total) by mouth 2 (two) times daily. 03/13/17   Tiffany L Reed, DO  B-D ULTRAFINE III SHORT PEN 31G X 8 MM MISC USE AS DIRECTED 02/01/17   Tiffany L Reed, DO  diltiazem (CARDIZEM CD) 180 MG 24 hr capsule Take 1 capsule (180 mg total) by mouth daily. 03/21/16   Tiffany L Reed, DO  furosemide (LASIX) 20 MG tablet Take 1 tablet (20 mg total) by mouth every other day. 03/21/16   Tiffany L Reed, DO  gabapentin (NEURONTIN) 100 MG capsule Take 1 capsule (100  mg total) by mouth 2 (two) times daily. 03/21/16   Tiffany L Reed, DO  glucose blood (ACCU-CHEK AVIVA PLUS) test strip Use to test blood sugar three times daily. Dx E11.59 04/29/16   Lauree Chandler, NP  Insulin Glargine (LANTUS SOLOSTAR) 100 UNIT/ML Solostar Pen Inject 10 Units into the skin at bedtime. 03/13/17   Tiffany L Reed, DO  insulin lispro (HUMALOG KWIKPEN) 100 UNIT/ML KiwkPen INJECT 4 UNITS after MEALS for glucose over 150 03/13/17   Tiffany L Reed, DO  lisinopril (PRINIVIL,ZESTRIL) 10 MG tablet Take 1 tablet (10 mg total) by mouth daily. 03/21/16   Tiffany L Reed, DO  Nutritional Supplements (FEEDING SUPPLEMENT, GLUCERNA 1.2 CAL,) LIQD Take 237 mLs by mouth every 30 (thirty) days. Reported on 05/25/2016    Historical Provider, MD  omeprazole (PRILOSEC) 20 MG capsule TAKE (1) CAPSULE DAILY. 01/25/17   Tiffany L Reed, DO  Oxycodone HCl 10 MG TABS Take 1 tablet (  10 mg total) by mouth every 8 (eight) hours as needed. For pain 03/22/17   Estill Dooms, MD    Family History Family History  Problem Relation Age of Onset  . Pneumonia Mother   . Heart disease Father   . Diabetes Father   . Colon cancer Neg Hx     Social History Social History  Substance Use Topics  . Smoking status: Former Research scientist (life sciences)  . Smokeless tobacco: Never Used     Comment: Quit at age 55   . Alcohol use No     Comment: none currenlty     Allergies   Morphine   Review of Systems Review of Systems ROS reviewed and all are negative for acute change except as noted in the HPI.  Physical Exam Updated Vital Signs BP 138/63 (BP Location: Right Arm)   Pulse (!) 128   Temp 99.4 F (37.4 C) (Oral)   Resp 16   SpO2 100%   Physical Exam  Constitutional: He is oriented to person, place, and time. Vital signs are normal. He appears well-developed and well-nourished.  HENT:  Head: Normocephalic and atraumatic.  Right Ear: Hearing normal.  Left Ear: Hearing normal.  Eyes: Conjunctivae and EOM are normal. Pupils are  equal, round, and reactive to light.  Neck: Normal range of motion. Neck supple.  Cardiovascular: Normal heart sounds and normal pulses.  An irregularly irregular rhythm present. Tachycardia present.   Pulmonary/Chest: Effort normal and breath sounds normal. No respiratory distress. He has no wheezes. He has no rales. He exhibits no tenderness.  Abdominal: Soft.  Musculoskeletal: Normal range of motion.  Right AKA Left Leg with non purulent cellulitis on anterior lower leg. TTP. No obvious swelling.   Neurological: He is alert and oriented to person, place, and time.  Skin: Skin is warm and dry.  Psychiatric: He has a normal mood and affect. His speech is normal and behavior is normal. Thought content normal.  Nursing note and vitals reviewed.  ED Treatments / Results  Labs (all labs ordered are listed, but only abnormal results are displayed) Labs Reviewed  COMPREHENSIVE METABOLIC PANEL - Abnormal; Notable for the following:       Result Value   Sodium 132 (*)    Chloride 98 (*)    CO2 21 (*)    Glucose, Bld 199 (*)    BUN 26 (*)    Creatinine, Ser 1.92 (*)    ALT 16 (*)    GFR calc non Af Amer 32 (*)    GFR calc Af Amer 37 (*)    All other components within normal limits  CBC WITH DIFFERENTIAL/PLATELET - Abnormal; Notable for the following:    WBC 23.0 (*)    Hemoglobin 12.3 (*)    HCT 37.2 (*)    Neutro Abs 20.2 (*)    All other components within normal limits  PROTIME-INR - Abnormal; Notable for the following:    Prothrombin Time 17.7 (*)    All other components within normal limits  I-STAT CG4 LACTIC ACID, ED - Abnormal; Notable for the following:    Lactic Acid, Venous 1.98 (*)    All other components within normal limits  CULTURE, BLOOD (ROUTINE X 2)  CULTURE, BLOOD (ROUTINE X 2)  URINALYSIS, ROUTINE W REFLEX MICROSCOPIC    EKG  EKG Interpretation None       Radiology Dg Tibia/fibula Left  Result Date: 03/22/2017 CLINICAL DATA:  Pain and swelling for 1  day EXAM: LEFT TIBIA  AND FIBULA - 2 VIEW COMPARISON:  None. FINDINGS: Meniscal calcifications are noted in the knee joint. No acute fracture or dislocation is seen. No soft tissue abnormality is noted. IMPRESSION: No acute abnormality noted. Electronically Signed   By: [name] M.D.   On: 03/22/2017 21:41    Procedures Procedures (including critical care time)  Medications Ordered in ED Medications  piperacillin-tazobactam (ZOSYN) IVPB 3.375 g (not administered)  vancomycin (VANCOCIN) IVPB 1000 mg/200 mL premix (not administered)  sodium chloride 0.9 % bolus 1,000 mL (not administered)  acetaminophen (TYLENOL) tablet 650 mg (not administered)     Initial Impression / Assessment and Plan / ED Course  I have reviewed the triage vital signs and the nursing notes.  Pertinent labs & imaging results that were available during my care of the patient were reviewed by me and considered in my medical decision making (see chart for details).  Final Clinical Impressions(s) / ED Diagnoses  {I have reviewed and evaluated the relevant laboratory values. {I have reviewed and evaluated the relevant imaging studies. {I have interpreted the relevant EKG. {I have reviewed the relevant previous healthcare records.  {I obtained HPI from historian. {Patient discussed with supervising physician.  ED Course:  Assessment: Pt is a 82 y.o. male with hx Atrial Fibrillation on Eliquis, CHD, HTN, DM2, St. Jude's Pacemaker who presents with left leg cellulitis with onset this morning. On exam, pt in Nontoxic/nonseptic appearing. VS with tachycardia. Normotensive.  Low grade temp 100.10F. Lungs CTA. Heart RRR. Abdomen nontender soft. iStat Lactate 1.98. WBC 23. Code Sepsis initiated. Made NPO. Given Vanc/Zosyn abx to cover Cellulitis. 1L NS Bolus given. Imaging of left leg unremarkable. Plan is to Admit to medicine. Repeat sepsis assessment completed.  Disposition/Plan:  Admit Pt acknowledges and agrees with  plan  Supervising Physician Sharlett Iles, MD  Final diagnoses:  Cellulitis of left lower extremity  Leukocytosis, unspecified type    New Prescriptions New Prescriptions   No medications on file     Shary Decamp, PA-C 03/22/17 2323    Little, Wenda Overland, MD 03/25/17 [phone]

## 2017-03-22 NOTE — ED Notes (Signed)
Patient transported to X-ray 

## 2017-03-22 NOTE — ED Notes (Signed)
Requested vancomycin from pharmacy

## 2017-03-22 NOTE — ED Triage Notes (Signed)
Patient with redness of the left leg and pain that started today.  Patient is tachycardic, with low grade fever in triage.  Patient has a right AKA.  Patient is a diabetic.

## 2017-03-23 ENCOUNTER — Telehealth: Payer: Self-pay | Admitting: Podiatry

## 2017-03-23 ENCOUNTER — Encounter (HOSPITAL_COMMUNITY): Payer: Self-pay | Admitting: Internal Medicine

## 2017-03-23 ENCOUNTER — Ambulatory Visit: Payer: Medicare Other | Admitting: Podiatry

## 2017-03-23 DIAGNOSIS — I482 Chronic atrial fibrillation: Secondary | ICD-10-CM

## 2017-03-23 DIAGNOSIS — N183 Chronic kidney disease, stage 3 unspecified: Secondary | ICD-10-CM | POA: Diagnosis present

## 2017-03-23 DIAGNOSIS — D72829 Elevated white blood cell count, unspecified: Secondary | ICD-10-CM

## 2017-03-23 DIAGNOSIS — A419 Sepsis, unspecified organism: Secondary | ICD-10-CM | POA: Diagnosis present

## 2017-03-23 DIAGNOSIS — N179 Acute kidney failure, unspecified: Secondary | ICD-10-CM | POA: Diagnosis present

## 2017-03-23 LAB — BASIC METABOLIC PANEL
Anion gap: 10 (ref 5–15)
BUN: 27 mg/dL — AB (ref 6–20)
CO2: 20 mmol/L — ABNORMAL LOW (ref 22–32)
CREATININE: 2.08 mg/dL — AB (ref 0.61–1.24)
Calcium: 8.1 mg/dL — ABNORMAL LOW (ref 8.9–10.3)
Chloride: 101 mmol/L (ref 101–111)
GFR calc Af Amer: 33 mL/min — ABNORMAL LOW (ref >60)
GFR, EST NON AFRICAN AMERICAN: 29 mL/min — AB (ref >60)
GLUCOSE: 305 mg/dL — AB (ref 65–99)
Potassium: 4.7 mmol/L (ref 3.5–5.1)
SODIUM: 131 mmol/L — AB (ref 135–145)

## 2017-03-23 LAB — GLUCOSE, CAPILLARY
GLUCOSE-CAPILLARY: 76 mg/dL (ref 65–99)
GLUCOSE-CAPILLARY: 204 mg/dL — AB (ref 65–99)
GLUCOSE-CAPILLARY: 205 mg/dL — AB (ref 65–99)
Glucose-Capillary: 182 mg/dL — ABNORMAL HIGH (ref 65–99)
Glucose-Capillary: 251 mg/dL — ABNORMAL HIGH (ref 65–99)

## 2017-03-23 LAB — LACTIC ACID, PLASMA
LACTIC ACID, VENOUS: 1.9 mmol/L (ref 0.5–1.9)
Lactic Acid, Venous: 2.2 mmol/L (ref 0.5–1.9)

## 2017-03-23 LAB — URINALYSIS, ROUTINE W REFLEX MICROSCOPIC
Bacteria, UA: NONE SEEN
Bilirubin Urine: NEGATIVE
Glucose, UA: 50 mg/dL — AB
Hgb urine dipstick: NEGATIVE
KETONES UR: NEGATIVE mg/dL
LEUKOCYTES UA: NEGATIVE
Nitrite: NEGATIVE
PROTEIN: 100 mg/dL — AB
SQUAMOUS EPITHELIAL / LPF: NONE SEEN
Specific Gravity, Urine: 1.016 (ref 1.005–1.030)
pH: 6 (ref 5.0–8.0)

## 2017-03-23 LAB — CBC
HCT: 30.7 % — ABNORMAL LOW (ref 39.0–52.0)
Hemoglobin: 9.9 g/dL — ABNORMAL LOW (ref 13.0–17.0)
MCH: 26.5 pg (ref 26.0–34.0)
MCHC: 32.2 g/dL (ref 30.0–36.0)
MCV: 82.3 fL (ref 78.0–100.0)
PLATELETS: 201 10*3/uL (ref 150–400)
RBC: 3.73 MIL/uL — ABNORMAL LOW (ref 4.22–5.81)
RDW: 15 % (ref 11.5–15.5)
WBC: 21.4 10*3/uL — AB (ref 4.0–10.5)

## 2017-03-23 LAB — PROCALCITONIN: PROCALCITONIN: 1.41 ng/mL

## 2017-03-23 MED ORDER — ACETAMINOPHEN 325 MG PO TABS: 650.0000 mg | ORAL_TABLET | Freq: Four times a day (QID) | ORAL | Status: DC | PRN

## 2017-03-23 MED ORDER — INSULIN ASPART 100 UNIT/ML ~~LOC~~ SOLN
4.0000 [IU] | Freq: Three times a day (TID) | SUBCUTANEOUS | Status: DC
  Administered 2017-03-23 – 2017-03-25 (×7): 4 [IU] via SUBCUTANEOUS

## 2017-03-23 MED ORDER — DILTIAZEM HCL ER COATED BEADS 180 MG PO CP24
180.0000 mg | ORAL_CAPSULE | Freq: Every day | ORAL | Status: DC
  Administered 2017-03-23 – 2017-03-25 (×3): 180 mg via ORAL
  Filled 2017-03-23 (×3): qty 1

## 2017-03-23 MED ORDER — SODIUM CHLORIDE 0.9 % IV SOLN
INTRAVENOUS | Status: DC
  Administered 2017-03-23 – 2017-03-24 (×3): via INTRAVENOUS

## 2017-03-23 MED ORDER — ONDANSETRON HCL 4 MG/2ML IJ SOLN: 4.0000 mg | Freq: Four times a day (QID) | INTRAMUSCULAR | Status: DC | PRN

## 2017-03-23 MED ORDER — APIXABAN 5 MG PO TABS
5.0000 mg | ORAL_TABLET | Freq: Two times a day (BID) | ORAL | Status: DC
Start: 2017-03-23 — End: 2017-03-25
  Administered 2017-03-23 – 2017-03-25 (×6): 5 mg via ORAL
  Filled 2017-03-23 (×6): qty 1

## 2017-03-23 MED ORDER — GABAPENTIN 100 MG PO CAPS
100.0000 mg | ORAL_CAPSULE | Freq: Two times a day (BID) | ORAL | Status: DC
Start: 2017-03-23 — End: 2017-03-25
  Administered 2017-03-23 – 2017-03-25 (×6): 100 mg via ORAL
  Filled 2017-03-23 (×6): qty 1

## 2017-03-23 MED ORDER — GLUCERNA 1.2 CAL PO LIQD: 237.0000 mL | ORAL | Status: DC

## 2017-03-23 MED ORDER — SODIUM CHLORIDE 0.9% FLUSH
3.0000 mL | Freq: Two times a day (BID) | INTRAVENOUS | Status: DC
  Administered 2017-03-23 – 2017-03-24 (×2): 3 mL via INTRAVENOUS

## 2017-03-23 MED ORDER — INSULIN ASPART 100 UNIT/ML ~~LOC~~ SOLN
0.0000 [IU] | Freq: Every day | SUBCUTANEOUS | Status: DC
  Administered 2017-03-23: 2 [IU] via SUBCUTANEOUS

## 2017-03-23 MED ORDER — MELATONIN 3 MG PO TABS
3.0000 mg | ORAL_TABLET | Freq: Every evening | ORAL | Status: DC | PRN
  Administered 2017-03-23 – 2017-03-24 (×3): 3 mg via ORAL
  Filled 2017-03-23 (×4): qty 1

## 2017-03-23 MED ORDER — INSULIN LISPRO 100 UNIT/ML (KWIKPEN): 4.0000 [IU] | PEN_INJECTOR | Freq: Three times a day (TID) | SUBCUTANEOUS | Status: DC

## 2017-03-23 MED ORDER — ACETAMINOPHEN 650 MG RE SUPP: 650.0000 mg | Freq: Four times a day (QID) | RECTAL | Status: DC | PRN

## 2017-03-23 MED ORDER — INSULIN ASPART 100 UNIT/ML ~~LOC~~ SOLN
0.0000 [IU] | Freq: Three times a day (TID) | SUBCUTANEOUS | Status: DC
  Administered 2017-03-23: 8 [IU] via SUBCUTANEOUS
  Administered 2017-03-23 – 2017-03-24 (×2): 5 [IU] via SUBCUTANEOUS
  Administered 2017-03-24 (×2): 3 [IU] via SUBCUTANEOUS
  Administered 2017-03-25: 2 [IU] via SUBCUTANEOUS
  Administered 2017-03-25: 5 [IU] via SUBCUTANEOUS

## 2017-03-23 MED ORDER — OXYCODONE HCL 5 MG PO TABS
10.0000 mg | ORAL_TABLET | Freq: Three times a day (TID) | ORAL | Status: DC | PRN
  Administered 2017-03-23 – 2017-03-25 (×6): 10 mg via ORAL
  Filled 2017-03-23 (×6): qty 2

## 2017-03-23 MED ORDER — PANTOPRAZOLE SODIUM 40 MG PO TBEC
40.0000 mg | DELAYED_RELEASE_TABLET | Freq: Every day | ORAL | Status: DC
  Administered 2017-03-23 – 2017-03-25 (×3): 40 mg via ORAL
  Filled 2017-03-23 (×3): qty 1

## 2017-03-23 MED ORDER — DOCUSATE SODIUM 100 MG PO CAPS
100.0000 mg | ORAL_CAPSULE | Freq: Two times a day (BID) | ORAL | Status: DC
  Administered 2017-03-23 – 2017-03-25 (×6): 100 mg via ORAL
  Filled 2017-03-23 (×6): qty 1

## 2017-03-23 MED ORDER — SODIUM CHLORIDE 0.9 % IV BOLUS (SEPSIS)
500.0000 mL | Freq: Once | INTRAVENOUS | Status: AC
  Administered 2017-03-23: 500 mL via INTRAVENOUS

## 2017-03-23 MED ORDER — ONDANSETRON HCL 4 MG PO TABS: 4.0000 mg | ORAL_TABLET | Freq: Four times a day (QID) | ORAL | Status: DC | PRN

## 2017-03-23 MED ORDER — INSULIN GLARGINE 100 UNIT/ML ~~LOC~~ SOLN
10.0000 [IU] | Freq: Every day | SUBCUTANEOUS | Status: DC
  Administered 2017-03-23 (×2): 10 [IU] via SUBCUTANEOUS
  Filled 2017-03-23 (×2): qty 0.1

## 2017-03-23 NOTE — Progress Notes (Signed)
CRITICAL VALUE ALERT  Critical value received:  Lactic Acid 2.2 up from 1.9  Date of notification:  03/23/17  Time of notification:  8850  Critical value read back:Yes.    Nurse who received alert:  K. Laurance Flatten  MD notified (1st page):  Tylene Fantasia  Time of first page:  [phone]  Responding MD: New order per Baltazar Najjar for 500 cc bolus  Time MD responded:  0604

## 2017-03-23 NOTE — Consult Note (Signed)
[facility] Nurse wound consult note Reason for Consult: left toe, patient reports he is followed by [facility] here in town and has been treated with "high power antibiotics by mouth" for his toe.  He has been instructed by podiatry to leave wound open to air. Admitted with LLE cellulitis, skin changes consistent with venous stasis and patient is known to have PAD as well.  Wound type: full thickness ulceration left great toe Measurement:1.0cm x 1.5cm x 0.1 Wound bed: pale, very dry Drainage (amount, consistency, odor) none Periwound: hyperkeratotic Dressing procedure/placement/frequency: Paint with betadine, allow to air dry.  Serves as antibacterial and drying agent. Patient can purchase OTC if he wishes to continue at home.  Discussed POC with patient and bedside nurse.  Re consult if needed, will not follow at this time. Thanks  Tamaira Ciriello [employer], RN,CWOCN, CNS [phone])

## 2017-03-23 NOTE — Progress Notes (Signed)
PROGRESS NOTE    [name]  UUV:[id number] DOB: January 03, 1935 DOA: 03/22/2017 PCP: [name], DO   Brief Narrative:  Patient is a 82 year old gentleman history of peripheral vascular disease status post right AKA and left great toe necrosis being followed by podiatrist, status post pacemaker placement for tachybradycardia syndrome, atrial fibrillation on chronic anticoagulation of eliquis, diastolic heart failure presented with acute onset of left lower extremity pain between the ankle and any found to have a cellulitis. Patient placed empirically on IV vancomycin and IV Zosyn.   Assessment & Plan:   Principal Problem:   Sepsis ([place]) Active Problems:   Essential hypertension   Atrial fibrillation (H[facility])   Diabetes mellitus ([place])   Cellulitis   CKD (chronic kidney disease), stage III  #1 sepsis secondary to left lower extremity cellulitis Patient on admission May criteria for sepsis with a leukocytosis with a white count of 23, fever, tachycardia, elevated lactic acid level. Patient with some clinical improvement. Patient has been pancultured. Plain films negative. Will check a sedimentation rate and a CRP. Continue empiric IV vancomycin and IV Zosyn. Elevate lower extremity. Follow.  #2 leukocytosis Secondary to problem #1. Patient has been pancultured. Continue empiric IV vancomycin and IV Zosyn.  #3 diabetes mellitus Hemoglobin A1c was 6.4 and 03/13/2017. CBGs have ranged from 76-251. Continue current regimen of Lantus, sliding scale insulin, meal coverage insulin.  #4 hypertension Blood pressure is borderline however patient asymptomatic. Lisinopril on hold. Patient on Cardizem.  #5 chronic kidney disease stage III Stable. Monitor volume status closely.  #6 atrial fibrillation CHA2DS2VASC SCORE 6 Currently rate controlled on Cardizem. Continue ELIQUIS for anticoagulation.    DVT prophylaxis: Eliquis Code Status: Full Family Communication: Updated patient. No  family at bedside. Disposition Plan: Home was lower extremity cellulitis has improved, improvement with leukocytosis.   Consultants:   None  Procedures:   Plain films of the left tib-fib 03/22/2017  Antimicrobials:   IV vancomycin 03/22/2017  IV Zosyn 03/22/2017   Subjective: Patient denies any shortness of breath or chest pain. Patient states left lower extremity pain improved since admission.  Objective: Vitals:   03/23/17 0015 03/23/17 0128 03/23/17 0425 03/23/17 1000  BP: (!) 101/46 (!) 110/56 (!) 100/45 (!) 102/49  Pulse: 93 87 60 (!) 59  Resp: 19 20 18 18   Temp:  99.5 F (37.5 C) 99.1 F (37.3 C) 98.9 F (37.2 C)  TempSrc:  Oral Oral Oral  SpO2: 95% 99% 98% 99%  Weight:  88 kg (193 lb 14.4 oz)    Height:  5\' 10"  (1.778 m)      Intake/Output Summary (Last 24 hours) at 03/23/17 1521 Last data filed at 03/23/17 1300  Gross per 24 hour  Intake          1556.35 ml  Output                0 ml  Net          1556.35 ml   Filed Weights   03/23/17 0128  Weight: 88 kg (193 lb 14.4 oz)    Examination:  General exam: Appears calm and comfortable  Respiratory system: Clear to auscultation. Respiratory effort normal. Cardiovascular system: S1 & S2 heard, RRR. No JVD, murmurs, rubs, gallops or clicks. No pedal edema. Gastrointestinal system: Abdomen is nondistended, soft and nontender. No organomegaly or masses felt. Normal bowel sounds heard. Central nervous system: Alert and oriented. No focal neurological deficits. Extremities:Left lower extremity with erythema, warmth, some swelling. Left great  toe appears to have had a distal amputation but actually necrotic autoamputation with a crust on the distal toe edge Skin: Left lower extremity with erythema, warmth, some swelling. Left great toe appears to have had a distal amputation but actually necrotic autoamputation with a crust on the distal toe edge Psychiatry: Judgement and insight appear normal. Mood & affect  appropriate.     Data Reviewed: I have personally reviewed following labs and imaging studies  CBC:  Recent Labs Lab 03/22/17 2023 03/23/17 0423  WBC 23.0* 21.4*  NEUTROABS 20.2*  --   HGB 12.3* 9.9*  HCT 37.2* 30.7*  MCV 81.8 82.3  PLT 258 737   Basic Metabolic Panel:  Recent Labs Lab 03/22/17 2023 03/23/17 0423  NA 132* 131*  K 4.3 4.7  CL 98* 101  CO2 21* 20*  GLUCOSE 199* 305*  BUN 26* 27*  CREATININE 1.92* 2.08*  CALCIUM 9.2 8.1*   GFR: Estimated Creatinine Clearance: 32.2 mL/min (A) (by C-G formula based on SCr of 2.08 mg/dL (H)). Liver Function Tests:  Recent Labs Lab 03/22/17 2023  AST 25  ALT 16*  ALKPHOS 109  BILITOT 0.8  PROT 7.9  ALBUMIN 3.8   No results for input(s): LIPASE, AMYLASE in the last 168 hours. No results for input(s): AMMONIA in the last 168 hours. Coagulation Profile:  Recent Labs Lab 03/22/17 2048  INR 1.45   Cardiac Enzymes: No results for input(s): CKTOTAL, CKMB, CKMBINDEX, TROPONINI in the last 168 hours. BNP (last 3 results) No results for input(s): PROBNP in the last 8760 hours. HbA1C: No results for input(s): HGBA1C in the last 72 hours. CBG:  Recent Labs Lab 03/22/17 2314 03/23/17 0118 03/23/17 0750 03/23/17 1139  GLUCAP 198* 182* 251* 76   Lipid Profile: No results for input(s): CHOL, HDL, LDLCALC, TRIG, CHOLHDL, LDLDIRECT in the last 72 hours. Thyroid Function Tests: No results for input(s): TSH, T4TOTAL, FREET4, T3FREE, THYROIDAB in the last 72 hours. Anemia Panel: No results for input(s): VITAMINB12, FOLATE, FERRITIN, TIBC, IRON, RETICCTPCT in the last 72 hours. Sepsis Labs:  Recent Labs Lab 03/22/17 2034 03/22/17 2324 03/23/17 0156 03/23/17 0157 03/23/17 0423  PROCALCITON  --   --   --  1.41  --   LATICACIDVEN 1.98* 1.45 1.9  --  2.2*    Recent Results (from the past 240 hour(s))  Blood culture (routine x 2)     Status: None (Preliminary result)   Collection Time: 03/22/17  8:20 PM    Result Value Ref Range Status   Specimen Description BLOOD RIGHT ARM  Final   Special Requests   Final    BOTTLES DRAWN AEROBIC AND ANAEROBIC Blood Culture adequate volume   Culture NO GROWTH < 24 HOURS  Final   Report Status PENDING  Incomplete  Blood culture (routine x 2)     Status: None (Preliminary result)   Collection Time: 03/22/17  8:28 PM  Result Value Ref Range Status   Specimen Description BLOOD LEFT ARM  Final   Special Requests IN PEDIATRIC BOTTLE Blood Culture adequate volume  Final   Culture NO GROWTH < 24 HOURS  Final   Report Status PENDING  Incomplete         Radiology Studies: Dg Tibia/fibula Left  Result Date: 03/22/2017 CLINICAL DATA:  Pain and swelling for 1 day EXAM: LEFT TIBIA AND FIBULA - 2 VIEW COMPARISON:  None. FINDINGS: Meniscal calcifications are noted in the knee joint. No acute fracture or dislocation is seen. No soft tissue  abnormality is noted. IMPRESSION: No acute abnormality noted. Electronically Signed   By: [name] M.D.   On: 03/22/2017 21:41        Scheduled Meds: . apixaban  5 mg Oral BID  . diltiazem  180 mg Oral Daily  . docusate sodium  100 mg Oral BID  . gabapentin  100 mg Oral BID  . insulin aspart  0-15 Units Subcutaneous TID WC  . insulin aspart  0-5 Units Subcutaneous QHS  . insulin aspart  4 Units Subcutaneous TID WC  . insulin glargine  10 Units Subcutaneous QHS  . pantoprazole  40 mg Oral Daily  . sodium chloride flush  3 mL Intravenous Q12H   Continuous Infusions: . sodium chloride 100 mL/hr at 03/23/17 0824  . piperacillin-tazobactam (ZOSYN)  IV 3.375 g (03/23/17 1227)  . vancomycin       LOS: 1 day    Time spent: 68 minutes    Davone Shinault, MD [facility] Pager [phone]  If 7PM-7AM, please contact night-coverage [email or web address] Password [email or web address] 03/23/2017, 3:21 PM

## 2017-03-23 NOTE — Progress Notes (Signed)
New Admission Note: Pt transferred to 6E18 from [facility] ED  Arrival Method: Via stretcher Mental Orientation: Alert and Oriented x 4 Telemetry: Placed on box 6E18 Assessment: Completed Skin: L leg cellultis. L foot Great toe ulcer, IV: R AC, L fa Pain: Denies Tubes: None Safety Measures: Safety Fall Prevention Plan has been discussed  Admission: To be completed 6 [place] Orientation: Patient has been orientated to the room, unit and staff.  Family: None at bedside  Pt request order for his melatonin to help him sleep. Page to on call Baltazar Najjar for notification. Orders to be reviewed and implemented. Will continue to monitor the patient. Call light has been placed within reach and bed alarm has been activated.   Mady Gemma, BSN, RN-BC Phone: [phone]

## 2017-03-23 NOTE — Telephone Encounter (Signed)
Pt is currently in the hospital for cellulitis of leg, room 6E 6718.

## 2017-03-23 NOTE — Progress Notes (Signed)
Inpatient Diabetes Program Recommendations  AACE/ADA: New Consensus Statement on Inpatient Glycemic Control (2015)  Target Ranges:  Prepandial:   less than 140 mg/dL      Peak postprandial:   less than 180 mg/dL (1-2 hours)      Critically ill patients:  140 - 180 mg/dL   Lab Results  Component Value Date   GLUCAP 76 03/23/2017   HGBA1C 6.4 (H) 03/13/2017    Review of Glycemic Control  Diabetes history: DM2 Outpatient Diabetes medications: Humalog 4 units tidwc, Lantus 10 units QHS Current orders for Inpatient glycemic control: Lantus 10 units QHS, Novolog 0-15 units tidwc and hs + 4units tidwc  HgbA1C of 6.4% indicates good glycemic control at home.  Inpatient Diabetes Program Recommendations:    Increase Lantus to 12 units QHS  Will follow closely.  Thank you. Lorenda Peck, RD, LDN, CDE Inpatient Diabetes Coordinator [phone]

## 2017-03-24 LAB — BASIC METABOLIC PANEL
ANION GAP: 8 (ref 5–15)
BUN: 27 mg/dL — ABNORMAL HIGH (ref 6–20)
CALCIUM: 8.3 mg/dL — AB (ref 8.9–10.3)
CO2: 21 mmol/L — AB (ref 22–32)
CREATININE: 1.98 mg/dL — AB (ref 0.61–1.24)
Chloride: 106 mmol/L (ref 101–111)
GFR calc Af Amer: 35 mL/min — ABNORMAL LOW (ref >60)
GFR, EST NON AFRICAN AMERICAN: 30 mL/min — AB (ref >60)
GLUCOSE: 206 mg/dL — AB (ref 65–99)
Potassium: 4.2 mmol/L (ref 3.5–5.1)
Sodium: 135 mmol/L (ref 135–145)

## 2017-03-24 LAB — GLUCOSE, CAPILLARY
GLUCOSE-CAPILLARY: 170 mg/dL — AB (ref 65–99)
Glucose-Capillary: 196 mg/dL — ABNORMAL HIGH (ref 65–99)
Glucose-Capillary: 209 mg/dL — ABNORMAL HIGH (ref 65–99)
Glucose-Capillary: 184 mg/dL — ABNORMAL HIGH (ref 65–99)

## 2017-03-24 LAB — CBC
HCT: 31 % — ABNORMAL LOW (ref 39.0–52.0)
Hemoglobin: 9.9 g/dL — ABNORMAL LOW (ref 13.0–17.0)
MCH: 26.3 pg (ref 26.0–34.0)
MCHC: 31.9 g/dL (ref 30.0–36.0)
MCV: 82.2 fL (ref 78.0–100.0)
PLATELETS: 173 10*3/uL (ref 150–400)
RBC: 3.77 MIL/uL — ABNORMAL LOW (ref 4.22–5.81)
RDW: 15.3 % (ref 11.5–15.5)
WBC: 9.3 10*3/uL (ref 4.0–10.5)

## 2017-03-24 LAB — LACTIC ACID, PLASMA: LACTIC ACID, VENOUS: 1 mmol/L (ref 0.5–1.9)

## 2017-03-24 MED ORDER — INSULIN GLARGINE 100 UNIT/ML ~~LOC~~ SOLN
12.0000 [IU] | Freq: Every day | SUBCUTANEOUS | Status: DC
  Administered 2017-03-24: 12 [IU] via SUBCUTANEOUS
  Filled 2017-03-24 (×2): qty 0.12

## 2017-03-24 MED ORDER — SODIUM CHLORIDE 0.9 % IV SOLN
INTRAVENOUS | Status: DC
  Administered 2017-03-24 – 2017-03-25 (×2): via INTRAVENOUS

## 2017-03-24 NOTE — Progress Notes (Signed)
PT Cancellation Note  Patient Details Name: [name] MRN: [id number] DOB: 29-May-1935   Cancelled Treatment:    Reason Eval/Treat Not Completed: Fatigue/lethargy limiting ability to participate (patient reports that he has been up to [facility] and to [facility], as he performs PTA and feels he is at baseline. will check back tomorrow. he is in process of having a new prosthetic made. )   Marcelino Freestone PT [phone]  03/24/2017, 4:34 PM

## 2017-03-24 NOTE — Progress Notes (Signed)
PROGRESS NOTE    [name]  ZJI:[id number] DOB: 1935-10-09 DOA: 03/22/2017 PCP: [name], DO   Brief Narrative:  Patient is a 82 year old gentleman history of peripheral vascular disease status post right AKA and left great toe necrosis being followed by podiatrist, status post pacemaker placement for tachybradycardia syndrome, atrial fibrillation on chronic anticoagulation of eliquis, diastolic heart failure presented with acute onset of left lower extremity pain between the ankle and any found to have a cellulitis. Patient placed empirically on IV vancomycin and IV Zosyn.   Assessment & Plan:   Principal Problem:   Sepsis ([place]) Active Problems:   Essential hypertension   Atrial fibrillation (HCC)   Diabetes mellitus ([place])   Cellulitis   CKD (chronic kidney disease), stage III   Leukocytosis  #1 sepsis secondary to left lower extremity cellulitis Patient on admission May criteria for sepsis with a leukocytosis with a white count of 23, fever, tachycardia, elevated lactic acid level. Patient with some clinical improvement. Patient has been pancultured. Plain films negative. WBC trending down. Continue empiric IV vancomycin and IV Zosyn. Elevate lower extremity. Follow.  #2 leukocytosis Secondary to problem #1. Patient has been pancultured. WBC trending down. Continue empiric IV vancomycin and IV Zosyn.  #3 diabetes mellitus Hemoglobin A1c was 6.4 and 03/13/2017. CBGs have ranged from 170-196. Continue current regimen of Lantus, sliding scale insulin, meal coverage insulin.  #4 hypertension Blood pressure is borderline however patient asymptomatic. Lisinopril on hold. Patient on Cardizem. Blood pressure is improving.  #5 chronic kidney disease stage III Stable. Monitor volume status closely.  #6 atrial fibrillation CHA2DS2VASC SCORE 6 Currently rate controlled on Cardizem. Continue ELIQUIS for anticoagulation.    DVT prophylaxis: Eliquis Code Status: Full Family  Communication: Updated patient. No family at bedside. Disposition Plan: Home when lower extremity cellulitis has improved, improvement with leukocytosis.   Consultants:   None  Procedures:   Plain films of the left tib-fib 03/22/2017  Antimicrobials:   IV vancomycin 03/22/2017  IV Zosyn 03/22/2017   Subjective: Patient states he feels better than on admission. Patient denies chest pain. No shortness of breath.   Objective: Vitals:   03/23/17 1000 03/23/17 2156 03/24/17 0559 03/24/17 0900  BP: (!) 102/49 (!) 126/58 (!) 153/70 (!) 127/58  Pulse: (!) 59 75 74 71  Resp: 18 17 17 18   Temp: 98.9 F (37.2 C) 99.1 F (37.3 C) 98.7 F (37.1 C) 99.4 F (37.4 C)  TempSrc: Oral Oral Oral Oral  SpO2: 99% 99% 100% 99%  Weight:  90 kg (198 lb 6.6 oz)    Height:        Intake/Output Summary (Last 24 hours) at 03/24/17 1017 Last data filed at 03/24/17 0900  Gross per 24 hour  Intake             3960 ml  Output             1750 ml  Net             2210 ml   Filed Weights   03/23/17 0128 03/23/17 2156  Weight: 88 kg (193 lb 14.4 oz) 90 kg (198 lb 6.6 oz)    Examination:  General exam: Appears calm and comfortable  Respiratory system: Clear to auscultation. Respiratory effort normal. Cardiovascular system: S1 & S2 heard, RRR. No JVD, murmurs, rubs, gallops or clicks. No pedal edema. Gastrointestinal system: Abdomen is nondistended, soft and nontender. No organomegaly or masses felt. Normal bowel sounds heard. Central nervous system: Alert and  oriented. No focal neurological deficits. Extremities:Left lower extremity with less erythema, less warmth, less swelling. Left great toe appears to have had a distal amputation but actually necrotic autoamputation with a crust on the distal toe edge Skin: Left lower extremity with less erythema, less warmth, less swelling. Left great toe appears to have had a distal amputation but actually necrotic autoamputation with a crust on the  distal toe edge Psychiatry: Judgement and insight appear normal. Mood & affect appropriate.     Data Reviewed: I have personally reviewed following labs and imaging studies  CBC:  Recent Labs Lab 03/22/17 2023 03/23/17 0423 03/24/17 0410  WBC 23.0* 21.4* 9.3  NEUTROABS 20.2*  --   --   HGB 12.3* 9.9* 9.9*  HCT 37.2* 30.7* 31.0*  MCV 81.8 82.3 82.2  PLT 258 201 892   Basic Metabolic Panel:  Recent Labs Lab 03/22/17 2023 03/23/17 0423 03/24/17 0410  NA 132* 131* 135  K 4.3 4.7 4.2  CL 98* 101 106  CO2 21* 20* 21*  GLUCOSE 199* 305* 206*  BUN 26* 27* 27*  CREATININE 1.92* 2.08* 1.98*  CALCIUM 9.2 8.1* 8.3*   GFR: Estimated Creatinine Clearance: 34.1 mL/min (A) (by C-G formula based on SCr of 1.98 mg/dL (H)). Liver Function Tests:  Recent Labs Lab 03/22/17 2023  AST 25  ALT 16*  ALKPHOS 109  BILITOT 0.8  PROT 7.9  ALBUMIN 3.8   No results for input(s): LIPASE, AMYLASE in the last 168 hours. No results for input(s): AMMONIA in the last 168 hours. Coagulation Profile:  Recent Labs Lab 03/22/17 2048  INR 1.45   Cardiac Enzymes: No results for input(s): CKTOTAL, CKMB, CKMBINDEX, TROPONINI in the last 168 hours. BNP (last 3 results) No results for input(s): PROBNP in the last 8760 hours. HbA1C: No results for input(s): HGBA1C in the last 72 hours. CBG:  Recent Labs Lab 03/23/17 0750 03/23/17 1139 03/23/17 1658 03/23/17 2153 03/24/17 0735  GLUCAP 251* 76 204* 205* 170*   Lipid Profile: No results for input(s): CHOL, HDL, LDLCALC, TRIG, CHOLHDL, LDLDIRECT in the last 72 hours. Thyroid Function Tests: No results for input(s): TSH, T4TOTAL, FREET4, T3FREE, THYROIDAB in the last 72 hours. Anemia Panel: No results for input(s): VITAMINB12, FOLATE, FERRITIN, TIBC, IRON, RETICCTPCT in the last 72 hours. Sepsis Labs:  Recent Labs Lab 03/22/17 2324 03/23/17 0156 03/23/17 0157 03/23/17 0423 03/24/17 0410  PROCALCITON  --   --  1.41  --   --     LATICACIDVEN 1.45 1.9  --  2.2* 1.0    Recent Results (from the past 240 hour(s))  Blood culture (routine x 2)     Status: None (Preliminary result)   Collection Time: 03/22/17  8:20 PM  Result Value Ref Range Status   Specimen Description BLOOD RIGHT ARM  Final   Special Requests   Final    BOTTLES DRAWN AEROBIC AND ANAEROBIC Blood Culture adequate volume   Culture NO GROWTH < 24 HOURS  Final   Report Status PENDING  Incomplete  Blood culture (routine x 2)     Status: None (Preliminary result)   Collection Time: 03/22/17  8:28 PM  Result Value Ref Range Status   Specimen Description BLOOD LEFT ARM  Final   Special Requests IN PEDIATRIC BOTTLE Blood Culture adequate volume  Final   Culture NO GROWTH < 24 HOURS  Final   Report Status PENDING  Incomplete         Radiology Studies: Dg Tibia/fibula Left  Result Date: 03/22/2017 CLINICAL DATA:  Pain and swelling for 1 day EXAM: LEFT TIBIA AND FIBULA - 2 VIEW COMPARISON:  None. FINDINGS: Meniscal calcifications are noted in the knee joint. No acute fracture or dislocation is seen. No soft tissue abnormality is noted. IMPRESSION: No acute abnormality noted. Electronically Signed   By: [name] M.D.   On: 03/22/2017 21:41        Scheduled Meds: . apixaban  5 mg Oral BID  . diltiazem  180 mg Oral Daily  . docusate sodium  100 mg Oral BID  . gabapentin  100 mg Oral BID  . insulin aspart  0-15 Units Subcutaneous TID WC  . insulin aspart  0-5 Units Subcutaneous QHS  . insulin aspart  4 Units Subcutaneous TID WC  . insulin glargine  12 Units Subcutaneous QHS  . pantoprazole  40 mg Oral Daily  . sodium chloride flush  3 mL Intravenous Q12H   Continuous Infusions: . sodium chloride    . piperacillin-tazobactam (ZOSYN)  IV 3.375 g (03/24/17 0548)  . vancomycin Stopped (03/23/17 2359)     LOS: 2 days    Time spent: 47 minutes    THOMPSON,DANIEL, MD [facility] Pager [phone]  If 7PM-7AM, please contact  night-coverage [email or web address] Password [email or web address] 03/24/2017, 10:17 AM

## 2017-03-25 LAB — CBC
HEMATOCRIT: 31 % — AB (ref 39.0–52.0)
Hemoglobin: 10.1 g/dL — ABNORMAL LOW (ref 13.0–17.0)
MCH: 26.9 pg (ref 26.0–34.0)
MCHC: 32.6 g/dL (ref 30.0–36.0)
MCV: 82.4 fL (ref 78.0–100.0)
Platelets: 183 10*3/uL (ref 150–400)
RBC: 3.76 MIL/uL — ABNORMAL LOW (ref 4.22–5.81)
RDW: 15.3 % (ref 11.5–15.5)
WBC: 8.2 10*3/uL (ref 4.0–10.5)

## 2017-03-25 LAB — BASIC METABOLIC PANEL
Anion gap: 9 (ref 5–15)
BUN: 19 mg/dL (ref 6–20)
CHLORIDE: 106 mmol/L (ref 101–111)
CO2: 21 mmol/L — ABNORMAL LOW (ref 22–32)
Calcium: 8.5 mg/dL — ABNORMAL LOW (ref 8.9–10.3)
Creatinine, Ser: 1.71 mg/dL — ABNORMAL HIGH (ref 0.61–1.24)
GFR calc Af Amer: 42 mL/min — ABNORMAL LOW (ref >60)
GFR calc non Af Amer: 36 mL/min — ABNORMAL LOW (ref >60)
GLUCOSE: 166 mg/dL — AB (ref 65–99)
POTASSIUM: 4.1 mmol/L (ref 3.5–5.1)
Sodium: 136 mmol/L (ref 135–145)

## 2017-03-25 LAB — GLUCOSE, CAPILLARY
Glucose-Capillary: 143 mg/dL — ABNORMAL HIGH (ref 65–99)
Glucose-Capillary: 218 mg/dL — ABNORMAL HIGH (ref 65–99)
Glucose-Capillary: 170 mg/dL — ABNORMAL HIGH (ref 65–99)

## 2017-03-25 MED ORDER — FUROSEMIDE 20 MG PO TABS: 20.0000 mg | ORAL_TABLET | ORAL | 0 refills | Status: DC

## 2017-03-25 MED ORDER — DILTIAZEM HCL ER COATED BEADS 180 MG PO CP24: 180.0000 mg | ORAL_CAPSULE | Freq: Every day | ORAL | Status: DC

## 2017-03-25 MED ORDER — INSULIN GLARGINE 100 UNIT/ML SOLOSTAR PEN: 12.0000 [IU] | PEN_INJECTOR | Freq: Every day | SUBCUTANEOUS | 0 refills | Status: DC

## 2017-03-25 MED ORDER — DILTIAZEM HCL ER COATED BEADS 120 MG PO CP24: 240.0000 mg | ORAL_CAPSULE | Freq: Every day | ORAL | Status: DC

## 2017-03-25 MED ORDER — AMOXICILLIN-POT CLAVULANATE 875-125 MG PO TABS: 1.0000 | ORAL_TABLET | Freq: Two times a day (BID) | ORAL | 0 refills | Status: AC

## 2017-03-25 MED ORDER — DOXYCYCLINE HYCLATE 100 MG PO CAPS: 100.0000 mg | ORAL_CAPSULE | Freq: Two times a day (BID) | ORAL | 0 refills | Status: AC

## 2017-03-25 NOTE — Progress Notes (Signed)
PT Cancellation Note  Patient Details Name: [name] MRN: [id number] DOB: Sep 13, 1935   Cancelled Treatment:    Reason Eval/Treat Not Completed: PT screened, no needs identified, will sign off. Pt feels his mobility is at baseline and that he doesn't currently need PT. Pt working with prosthetist and is getting new prosthesis very soon.    [place] 03/25/2017, 9:32 AM [place]

## 2017-03-25 NOTE — Progress Notes (Signed)
Pharmacy Antibiotic Note  [name] is a 82 y.o. male admitted on 03/22/2017 with sepsis 2/2 cellulitis.  Pharmacy has been consulted for vancomycin and Zosyn dosing. Patient is afebrile, WBC has trended down to 8.2, Scr has trended down to 1.71 (CrCl ~36 ml/min).   Plan: Vancomycin 1250 mg IV every 24 hours.  Goal trough 15-20 mcg/mL. Zosyn 3.375g IV q8h (4 hour infusion). Monitor renal fxn, cultures, ability to de-escalate   Height: 5\' 10"  (177.8 cm) Weight: 186 lb (84.4 kg) IBW/kg (Calculated) : 73  Temp (24hrs), Avg:98.7 F (37.1 C), Min:98.5 F (36.9 C), Max:98.9 F (37.2 C)   Recent Labs Lab 03/22/17 2023 03/22/17 2034 03/22/17 2324 03/23/17 0156 03/23/17 0423 03/24/17 0410 03/25/17 0343  WBC 23.0*  --   --   --  21.4* 9.3 8.2  CREATININE 1.92*  --   --   --  2.08* 1.98* 1.71*  LATICACIDVEN  --  1.98* 1.45 1.9 2.2* 1.0  --     Estimated Creatinine Clearance: 36.2 mL/min (A) (by C-G formula based on SCr of 1.71 mg/dL (H)).    Allergies  Allergen Reactions  . Morphine Rash and Other (See Comments)    Irritability also    Antimicrobials this admission: Vanc 5/2>> Zosyn 5/2>>  Dose adjustments this admission: n/a  Microbiology results:  5/2 BCx: ngtd   Thank you for allowing pharmacy to be a part of this patient's care.  Belia Heman, PharmD PGY1 Pharmacy Resident [phone] (Pager) 03/25/2017 2:43 PM

## 2017-03-25 NOTE — Discharge Summary (Signed)
Physician Discharge Summary  QUANTRELL SPLITT HXT:[id number] DOB: 1935/10/03 DOA: 03/22/2017  PCP: [name], DO  Admit date: 03/22/2017 Discharge date: 03/25/2017  Time spent: 65 minutes  Recommendations for Outpatient Follow-up:  1. Follow-up with podiatrist in 1 week. 2. Follow-up with Mariea Clonts, Tiffany L, DO in 1-2 weeks. On follow-up patient will need a basic metabolic profile done to follow-up on electrolytes and renal function. Patient's blood pressure will need to be reassessed. Patient's ACE inhibitor was discontinued on discharge.   Discharge Diagnoses:  Principal Problem:   Sepsis ([place]) Active Problems:   Essential hypertension   Atrial fibrillation (HCC)   Diabetes mellitus ([place])   Cellulitis   CKD (chronic kidney disease), stage III   Leukocytosis   Discharge Condition: stable and improved.  Diet recommendation: Carb modified diet.  Filed Weights   03/23/17 0128 03/23/17 2156 03/24/17 2125  Weight: 88 kg (193 lb 14.4 oz) 90 kg (198 lb 6.6 oz) 84.4 kg (186 lb)    History of present illness:  Per Dr.Yates [name] is a 82 y.o. male with medical history significant of PVD s/p R AKA and left great toe necrosis; DM; pacemaker placement for tachy-brady syndrome; afib on Eliquis; and diastolic heart failure presenting with acute onset of pain in left leg between the ankle and knee, started at 2pm.  "It was truly excruciating".  He tried exercise, elevating, mustard - nothing worked.  Called the nurse line, who told him to come in for admission for possible cellulitis.  He began checking his temperature over and over - fever at 2:30, 100.2 -> 100.5 -> 100.9 -> 101.3 -> 101.7.  Then came to the ER.  Sugars 121-164 yesterday.  Lantus 10 units "the official prescription amount" but he "has permission to lower it" as needed.   ED Course:  Code sepsis due to fever, tachycardia, elevated lactate - Vanc/Zosyn for cellulitis, 1L bolus.   Hospital Course:  #1 sepsis  secondary to left lower extremity cellulitis Patient on admission met criteria for sepsis with a leukocytosis with a white count of 23, fever, tachycardia, elevated lactic acid level. Patient was admitted place on IV fluids, empiric IV vancomycin and IV Zosyn and follow. Chest x-ray which was done was negative. It was felt patient's sepsis was secondary to left lower extremity cellulitis. Patient improved clinically WBC trended down and had resolved by day of discharge. Patient will be discharged home on Augmentin and doxycycline for 5 more days to complete a course of antibiotic treatment.   #2 leukocytosis Secondary to problem #1. Patient has been pancultured. Blood cultures with no growth to date. Patient's leukocytosis trended down. Patient was placed empirically on IV vancomycin and IV Zosyn. Patient improved clinically remained afebrile with discharged home on doxycycline and Augmentin for 5 more days to complete a course of antibiotic treatment. Outpatient follow-up.   #3 diabetes mellitus Hemoglobin A1c was 6.4 and 03/13/2017. Patient's Lantus dose was increased to 12 units daily. Patient was maintained on sliding-scale insulin as well as meal coverage insulin. CBGs remained stable.   #4 hypertension Blood pressure was borderline on admission however patient remained asymptomatic. Patient's lisinopril was held. Patient was maintained on home regimen of Cardizem and monitored. Patient was also placed on IV fluids. Patient's blood pressure improved and stabilized by day of discharge. Patient's ACE inhibitor will be discontinued on discharge. Outpatient follow-up.  #5 chronic kidney disease stage III Stable.   #6 atrial fibrillation CHA2DS2VASC SCORE 6 Patient remained rate controlled on  Cardizem. Patient maintained on ELIQUIS for anticoagulation.  Procedures:  Plain films of the left tib-fib 03/22/2017   Consultations:  None  Discharge Exam: Vitals:   03/25/17 0415 03/25/17  1006  BP: 133/61 (!) 155/62  Pulse: 70 71  Resp: 18 18  Temp: 98.6 F (37 C) 98.5 F (36.9 C)    General: NAD Cardiovascular: RRR Respiratory: CTAB Ext: LLE less erythemous, less TTP, decreased swelling.  Discharge Instructions   Discharge Instructions    Diet Carb Modified    Complete by:  As directed    Increase activity slowly    Complete by:  As directed      Current Discharge Medication List    START taking these medications   Details  amoxicillin-clavulanate (AUGMENTIN) 875-125 MG tablet Take 1 tablet by mouth 2 (two) times daily. Take for 5 days then stop. Qty: 10 tablet, Refills: 0    doxycycline (VIBRAMYCIN) 100 MG capsule Take 1 capsule (100 mg total) by mouth 2 (two) times daily. Take for 5 days then stop. Qty: 10 capsule, Refills: 0      CONTINUE these medications which have CHANGED   Details  furosemide (LASIX) 20 MG tablet Take 1 tablet (20 mg total) by mouth every other day. Qty: 45 tablet, Refills: 0    Insulin Glargine (LANTUS SOLOSTAR) 100 UNIT/ML Solostar Pen Inject 12 Units into the skin at bedtime. Qty: 15 mL, Refills: 0      CONTINUE these medications which have NOT CHANGED   Details  apixaban (ELIQUIS) 5 MG TABS tablet Take 1 tablet (5 mg total) by mouth 2 (two) times daily. Qty: 180 tablet, Refills: 3    diltiazem (CARDIZEM CD) 180 MG 24 hr capsule Take 1 capsule (180 mg total) by mouth daily. Qty: 90 capsule, Refills: 3    gabapentin (NEURONTIN) 100 MG capsule Take 1 capsule (100 mg total) by mouth 2 (two) times daily. Qty: 180 capsule, Refills: 3    insulin lispro (HUMALOG KWIKPEN) 100 UNIT/ML KiwkPen INJECT 4 UNITS after MEALS for glucose over 150 Qty: 15 mL, Refills: 5    Nutritional Supplements (FEEDING SUPPLEMENT, GLUCERNA 1.2 CAL,) LIQD Take 237 mLs by mouth every 30 (thirty) days. Reported on 05/25/2016    omeprazole (PRILOSEC) 20 MG capsule TAKE (1) CAPSULE DAILY. Qty: 90 capsule, Refills: 3    Oxycodone HCl 10 MG TABS Take  1 tablet (10 mg total) by mouth every 8 (eight) hours as needed. For pain Qty: 90 tablet, Refills: 0    B-D ULTRAFINE III SHORT PEN 31G X 8 MM MISC USE AS DIRECTED Qty: 100 each, Refills: 11    glucose blood (ACCU-CHEK AVIVA PLUS) test strip Use to test blood sugar three times daily. Dx E11.59 Qty: 300 each, Refills: 3      STOP taking these medications     lisinopril (PRINIVIL,ZESTRIL) 10 MG tablet        Allergies  Allergen Reactions  . Morphine Rash and Other (See Comments)    Irritability also   Follow-up Information    Reed, Tiffany L, DO. Schedule an appointment as soon as possible for a visit in 1 week(s).   Specialty:  Geriatric Medicine Why:  f/u in 1-2 weeks. Contact information: [address] [place] [address] [phone]        podiatrist. Schedule an appointment as soon as possible for a visit in 1 week(s).   Why:  f/u in 1 week.  The results of significant diagnostics from this hospitalization (including imaging, microbiology, ancillary and laboratory) are listed below for reference.    Significant Diagnostic Studies: Dg Chest 2 View  Result Date: 03/06/2017 CLINICAL DATA:  c/o generalized weakness, "coughing up brown sputum x 3 days" and L great toe pain. Pt states he has had trouble sleeping past few nights. DM, HTN, ex-smoker. Hx pacemaker, CHF, a-fib EXAM: CHEST  2 VIEW COMPARISON:  12/12/2013 FINDINGS: Shallow inspiration with linear atelectasis in the left lung base. Normal heart size and pulmonary vascularity. No focal airspace disease or consolidation in the lungs. No blunting of costophrenic angles. No pneumothorax. Mediastinal contours appear intact. Cardiac pacemaker. Old resection or resorption of the distal right clavicle. Compression of T12 anteriorly, similar prior study. IMPRESSION: Shallow inspiration with linear atelectasis in the left lung base. Electronically Signed   By: Lucienne Capers M.D.   On: 03/06/2017 05:47   Dg  Tibia/fibula Left  Result Date: 03/22/2017 CLINICAL DATA:  Pain and swelling for 1 day EXAM: LEFT TIBIA AND FIBULA - 2 VIEW COMPARISON:  None. FINDINGS: Meniscal calcifications are noted in the knee joint. No acute fracture or dislocation is seen. No soft tissue abnormality is noted. IMPRESSION: No acute abnormality noted. Electronically Signed   By: [name] M.D.   On: 03/22/2017 21:41   Ct Head Wo Contrast  Result Date: 03/06/2017 CLINICAL DATA:  Recent fever and cough. Today with weakness, disorientation, and confusion. EXAM: CT HEAD WITHOUT CONTRAST TECHNIQUE: Contiguous axial images were obtained from the base of the skull through the vertex without intravenous contrast. COMPARISON:  12/11/2012 FINDINGS: Brain: No evidence of acute infarction, hemorrhage, hydrocephalus, extra-axial collection or mass lesion/mass effect. Diffuse cerebral atrophy. Mild ventricular dilatation consistent with central atrophy. Low-attenuation changes in the deep white matter consistent small vessel ischemia. Old lacunar infarcts in the basal ganglia bilaterally and in the right cerebellum. Vascular: No hyperdense vessel or unexpected calcification. Skull: No depressed skull fractures. Sinuses/Orbits: Mucosal thickening and air-fluid levels in both maxillary sinuses, likely inflammatory. Mucosal thickening in the ethmoid air cells. Mastoid air cells are not opacified. Other: None. IMPRESSION: No acute intracranial abnormalities. Chronic atrophy and small vessel ischemic changes. Mucosal thickening and air-fluid levels in the paranasal sinuses may indicate sinusitis. Electronically Signed   By: Lucienne Capers M.D.   On: 03/06/2017 06:02    Microbiology: Recent Results (from the past 240 hour(s))  Blood culture (routine x 2)     Status: None (Preliminary result)   Collection Time: 03/22/17  8:20 PM  Result Value Ref Range Status   Specimen Description BLOOD RIGHT ARM  Final   Special Requests   Final    BOTTLES  DRAWN AEROBIC AND ANAEROBIC Blood Culture adequate volume   Culture NO GROWTH 3 DAYS  Final   Report Status PENDING  Incomplete  Blood culture (routine x 2)     Status: None (Preliminary result)   Collection Time: 03/22/17  8:28 PM  Result Value Ref Range Status   Specimen Description BLOOD LEFT ARM  Final   Special Requests IN PEDIATRIC BOTTLE Blood Culture adequate volume  Final   Culture NO GROWTH 3 DAYS  Final   Report Status PENDING  Incomplete     Labs: Basic Metabolic Panel:  Recent Labs Lab 03/22/17 2023 03/23/17 0423 03/24/17 0410 03/25/17 0343  NA 132* 131* 135 136  K 4.3 4.7 4.2 4.1  CL 98* 101 106 106  CO2 21* 20* 21* 21*  GLUCOSE 199* 305*  206* 166*  BUN 26* 27* 27* 19  CREATININE 1.92* 2.08* 1.98* 1.71*  CALCIUM 9.2 8.1* 8.3* 8.5*   Liver Function Tests:  Recent Labs Lab 03/22/17 2023  AST 25  ALT 16*  ALKPHOS 109  BILITOT 0.8  PROT 7.9  ALBUMIN 3.8   No results for input(s): LIPASE, AMYLASE in the last 168 hours. No results for input(s): AMMONIA in the last 168 hours. CBC:  Recent Labs Lab 03/22/17 2023 03/23/17 0423 03/24/17 0410 03/25/17 0343  WBC 23.0* 21.4* 9.3 8.2  NEUTROABS 20.2*  --   --   --   HGB 12.3* 9.9* 9.9* 10.1*  HCT 37.2* 30.7* 31.0* 31.0*  MCV 81.8 82.3 82.2 82.4  PLT 258 201 173 183   Cardiac Enzymes: No results for input(s): CKTOTAL, CKMB, CKMBINDEX, TROPONINI in the last 168 hours. BNP: BNP (last 3 results) No results for input(s): BNP in the last 8760 hours.  ProBNP (last 3 results) No results for input(s): PROBNP in the last 8760 hours.  CBG:  Recent Labs Lab 03/24/17 1213 03/24/17 1649 03/24/17 2132 03/25/17 0754 03/25/17 1152  GLUCAP 196* 209* 184* 143* 218*       Signed:  Joanmarie Tsang MD.  [facility] 03/25/2017, 3:39 PM

## 2017-03-27 ENCOUNTER — Telehealth: Payer: Self-pay

## 2017-03-27 LAB — CULTURE, BLOOD (ROUTINE X 2)
CULTURE: NO GROWTH
CULTURE: NO GROWTH
SPECIAL REQUESTS: ADEQUATE
Special Requests: ADEQUATE

## 2017-03-27 NOTE — Consult Note (Signed)
           [facility] CM Primary Care Navigator  03/27/2017  [name] 11-18-1935 [id number]   Went to see patient at the bedside to identify possible discharge needs but he was alreadydischarged.  Patient was discharged home this weekend.  Primary care provider's office called (Betty/ Sara)to notify of patient's discharge and need for post hospital follow-up and transition of care. Notified also of patient's health needs/issues needing follow-up.   Made aware to refer patient to [facility] care management if deemed appropriate for services.   For questions, please contact:  Dannielle Huh, BSN, RN- [facility] Primary Care Navigator  Telephone: [phone] [place]

## 2017-03-27 NOTE — Telephone Encounter (Signed)
Message left on clinical intake voicemail by [facility] assistance [place]. Patient was discharged this weekend. Patient will need to be re-evaluated for his B/P due to medication changes. Patient will also need repeat labs, if you feel like patient needs additional assistance after [facility] appointment please refer to [facility].  Another message was left on clinical intake voicemail by the patient requesting to discuss medication changes made by hospitalist (Dr.Thompson), patient states he does not wish to come in and would like to speak directly to Dr.Reed (not a middle man) to discuss his concerns.

## 2017-03-28 NOTE — Telephone Encounter (Signed)
LMOM to return call.

## 2017-03-28 NOTE — Telephone Encounter (Signed)
He should schedule a TOC visit for follow up as was recommended when he left the hospital.  We need to determine if his ace inhibitor can be restarted.  Also, he was to take his insulin exactly as we discussed at his last appt.

## 2017-03-28 NOTE — Telephone Encounter (Signed)
Appointment scheduled for 5/15 with Jessica.

## 2017-04-04 ENCOUNTER — Encounter: Payer: Self-pay | Admitting: Nurse Practitioner

## 2017-04-04 ENCOUNTER — Ambulatory Visit (INDEPENDENT_AMBULATORY_CARE_PROVIDER_SITE_OTHER): Payer: Medicare Other | Admitting: Nurse Practitioner

## 2017-04-04 VITALS — BP 124/76 | HR 72 | Temp 98.3°F | Resp 17

## 2017-04-04 DIAGNOSIS — L03116 Cellulitis of left lower limb: Secondary | ICD-10-CM | POA: Diagnosis not present

## 2017-04-04 DIAGNOSIS — N183 Chronic kidney disease, stage 3 unspecified: Secondary | ICD-10-CM

## 2017-04-04 DIAGNOSIS — E1151 Type 2 diabetes mellitus with diabetic peripheral angiopathy without gangrene: Secondary | ICD-10-CM

## 2017-04-04 DIAGNOSIS — I10 Essential (primary) hypertension: Secondary | ICD-10-CM | POA: Diagnosis not present

## 2017-04-04 DIAGNOSIS — I482 Chronic atrial fibrillation, unspecified: Secondary | ICD-10-CM

## 2017-04-04 LAB — BASIC METABOLIC PANEL WITH GFR
BUN: 18 mg/dL (ref 7–25)
CALCIUM: 9 mg/dL (ref 8.6–10.3)
CHLORIDE: 103 mmol/L (ref 98–110)
CO2: 23 mmol/L (ref 20–31)
CREATININE: 1.61 mg/dL — AB (ref 0.70–1.18)
GFR, Est African American: 46 mL/min — ABNORMAL LOW (ref >=60)
GFR, Est Non African American: 40 mL/min — ABNORMAL LOW (ref >=60)
GLUCOSE: 130 mg/dL — AB (ref 65–99)
Potassium: 4.2 mmol/L (ref 3.5–5.3)
Sodium: 136 mmol/L (ref 135–146)

## 2017-04-04 NOTE — Progress Notes (Addendum)
Careteam: Patient Care Team: [name], DO as PCP - General (Geriatric Medicine) Ricard Dillon, MD as Consulting Physician (Internal Medicine) Deboraha Sprang, MD as Consulting Physician (Cardiology) Thompson Grayer, MD as Consulting Physician (Cardiology) [name], MD as Consulting Physician (Orthopedic Surgery) Wallene Huh, DPM as Consulting Physician (Podiatry)  Advanced Directive information Does Patient Have a Medical Advance Directive?: Yes, Type of Advance Directive: Living will  Allergies  Allergen Reactions  . Morphine Rash and Other (See Comments)    Irritability also    Chief Complaint  Patient presents with  . Transitions Of Care    Pt recently hospitalized at [facility] 5/2 to 5/5 due to LLE cellulitis.      HPI: Patient is a 82 y.o. male seen in the office today for hospital follow up. with medical history significant of PVD s/p R AKA and left great toe necrosis; DM; pacemaker placement for tachy-brady syndrome; afib on Eliquis; and diastolic heart failure presenting with acute onset of pain in left leg between the ankle and knee.  Patient was admitted place on IV fluids, empiric IV vancomycin and IV Zosyn and follow. Chest x-ray which was done was negative. Noted to have sepsis was secondary to left lower extremity cellulitis. Patient improved clinically WBC trended down and had resolved by day of discharge. Patient was discharged home on Augmentin and doxycycline for 5 more days to complete a course of antibiotic treatment. He has completed both antibiotics and tolerated well.  Redness and pain has resolved. sensitivity remains but this has been chronic   Pt was taken off lisinopril due to renal function  lantus increased to 12 units and blood sugars have been well controlled. No hypoglycemia.   Review of Systems:  Review of Systems  Constitutional: Negative for activity change, appetite change, chills and fever.  HENT: Negative for congestion.     Eyes:       Glasses  Respiratory: Negative for cough and shortness of breath.   Cardiovascular: Negative for chest pain, palpitations and leg swelling.  Gastrointestinal: Positive for constipation. Negative for abdominal pain and diarrhea.  Genitourinary: Negative for difficulty urinating, dysuria and frequency.  Musculoskeletal: Positive for back pain. Negative for myalgias.  Skin: Positive for wound. Negative for rash.       Ulcers left great and second toes - followed by podiatry   Neurological: Negative for weakness.       Phantom pain in right leg  Psychiatric/Behavioral: The patient is not nervous/anxious.     Past Medical History:  Diagnosis Date  . Atrial fibrillation ([place])    permanent  . Backache, unspecified   . Cellulitis and abscess of leg, except foot   . CHF (congestive heart failure) ([place])   . Diverticulosis 05-2006   Dr. [name]   . Esophageal stricture   . Fatigue   . GERD (gastroesophageal reflux disease)   . Gout, unspecified   . Hx of colonic polyps 05-2006   (Adenomatous)Dr. [name]  . Hx of radiation therapy 06/16/14- 07/18/14   mult squamous cell carcinomas of scalp  . Hypertension   . Long term (current) use of anticoagulants   . Overweight(278.02)   . Pacemaker- Vivian 02/20/2013  . Phlebitis and thrombophlebitis of superficial vessels of lower extremities   . Right bundle branch block   . Skin cancer    squamous cell carcinoma scalp  . Syncope and collapse   . Tachycardia-bradycardia ([place])    s/p PPM  .  Type II or unspecified type diabetes mellitus without mention of complication, not stated as uncontrolled   . Unspecified venous (peripheral) insufficiency    Past Surgical History:  Procedure Laterality Date  . ABOVE KNEE LEG AMPUTATION Right 04/02/2014   DR DUDA  . AMPUTATION Right 01/20/2014   Procedure: AMPUTATION BELOW KNEE- right;  Surgeon: [name], MD;  Location: [place];  Service: Orthopedics;  Laterality: Right;  Right Below Knee  Amputation  . AMPUTATION Right 02/14/2014   Procedure: AMPUTATION BELOW KNEE;  Surgeon: [name], MD;  Location: [place];  Service: Orthopedics;  Laterality: Right;  Right Below Knee Amputation Revision  . AMPUTATION Right 03/07/2014   Procedure: AMPUTATION BELOW KNEE;  Surgeon: [name], MD;  Location: [place];  Service: Orthopedics;  Laterality: Right;  Right Below Knee Amputation Revision  . AMPUTATION Right 03/18/2014   Procedure: revision of  AMPUTATION BELOW KNEE;  Surgeon: [name], MD;  Location: [place];  Service: Orthopedics;  Laterality: Right;  Revision Right Below Knee Amputation  . AMPUTATION Right 04/02/2014   Procedure: AMPUTATION ABOVE KNEE;  Surgeon: [name], MD;  Location: [place];  Service: Orthopedics;  Laterality: Right;  Right Above Knee Amputation  . EYE SURGERY    . I&D EXTREMITY Right 01/17/2014   Procedure: IRRIGATION AND DEBRIDEMENT RIGHT HEEL  WITH CULTURES AND BONE BIOPSY, placement of wound vac;  Surgeon: Theodoro Kos, DO;  Location: [place];  Service: Plastics;  Laterality: Right;  . LAMINECTOMY    . LUMBAR FUSION    . PACEMAKER INSERTION  8/12   SJM by [name] for tachy/brady syndrome  . ROTATOR CUFF REPAIR    . ruptured rt rectus muscle    . SKIN BIOPSY     scalp   Social History:   reports that he has quit smoking. He has never used smokeless tobacco. He reports that he does not drink alcohol or use drugs.  Family History  Problem Relation Age of Onset  . Pneumonia Mother   . Heart disease Father   . Diabetes Father   . Colon cancer Neg Hx     Medications: Patient's Medications  New Prescriptions   No medications on file  Previous Medications   APIXABAN (ELIQUIS) 5 MG TABS TABLET    Take 1 tablet (5 mg total) by mouth 2 (two) times daily.   B-D ULTRAFINE III SHORT PEN 31G X 8 MM MISC    USE AS DIRECTED   DILTIAZEM (CARDIZEM CD) 180 MG 24 HR CAPSULE    Take 1 capsule (180 mg total) by mouth daily.   FUROSEMIDE (LASIX) 20 MG TABLET     Take 1 tablet (20 mg total) by mouth every other day.   GABAPENTIN (NEURONTIN) 100 MG CAPSULE    Take 1 capsule (100 mg total) by mouth 2 (two) times daily.   GLUCOSE BLOOD (ACCU-CHEK AVIVA PLUS) TEST STRIP    Use to test blood sugar three times daily. Dx E11.59   INSULIN GLARGINE (LANTUS SOLOSTAR) 100 UNIT/ML SOLOSTAR PEN    Inject 12 Units into the skin at bedtime.   INSULIN LISPRO (HUMALOG KWIKPEN) 100 UNIT/ML KIWKPEN    INJECT 4 UNITS after MEALS for glucose over 150   NUTRITIONAL SUPPLEMENTS (FEEDING SUPPLEMENT, GLUCERNA 1.2 CAL,) LIQD    Take 237 mLs by mouth every 30 (thirty) days. Reported on 05/25/2016   OMEPRAZOLE (PRILOSEC) 20 MG CAPSULE    TAKE (1) CAPSULE DAILY.   OXYCODONE HCL  10 MG TABS    Take 1 tablet (10 mg total) by mouth every 8 (eight) hours as needed. For pain  Modified Medications   No medications on file  Discontinued Medications   No medications on file     Physical Exam:  Vitals:   04/04/17 1453  BP: 124/76  Pulse: 72  Resp: 17  Temp: 98.3 F (36.8 C)  TempSrc: Oral  SpO2: 98%   There is no height or weight on file to calculate BMI.  Physical Exam  Constitutional: He is oriented to person, place, and time. He appears well-developed and well-nourished. No distress.  Cardiovascular: Normal rate, normal heart sounds and intact distal pulses.  An irregularly irregular rhythm present.  Pulmonary/Chest: Effort normal and breath sounds normal. No respiratory distress.  Abdominal: Soft. Bowel sounds are normal.  Musculoskeletal:  Right AKA, uses standard w/c to get around  Neurological: He is alert and oriented to person, place, and time.  Skin: Skin is warm and dry.  Left great toe ulcer  Psychiatric: He has a normal mood and affect.    Labs reviewed: Basic Metabolic Panel:  Recent Labs  03/23/17 0423 03/24/17 0410 03/25/17 0343  NA 131* 135 136  K 4.7 4.2 4.1  CL 101 106 106  CO2 20* 21* 21*  GLUCOSE 305* 206* 166*  BUN 27* 27* 19  CREATININE  2.08* 1.98* 1.71*  CALCIUM 8.1* 8.3* 8.5*   Liver Function Tests:  Recent Labs  03/06/17 0511 03/13/17 0305 03/22/17 2023  AST _0 ALT 9* 7* 16*  ALKPHOS 77 78 109  BILITOT 0.5 0.6 0.8  PROT 6.8 6.6 7.9  ALBUMIN 3.2* 3.5* 3.8   No results for input(s): LIPASE, AMYLASE in the last 8760 hours. No results for input(s): AMMONIA in the last 8760 hours. CBC:  Recent Labs  03/06/17 0511 03/13/17 0305 03/22/17 2023 03/23/17 0423 03/24/17 0410 03/25/17 0343  WBC 9.7 7.7 23.0* 21.4* 9.3 8.2  NEUTROABS 7.7 5,544 20.2*  --   --   --   HGB 11.1* 11.0* 12.3* 9.9* 9.9* 10.1*  HCT 34.0* 33.6* 37.2* 30.7* 31.0* 31.0*  MCV 83.1 83.8 81.8 82.3 82.2 82.4  PLT 204 229 258 201 173 183   Lipid Panel: No results for input(s): CHOL, HDL, LDLCALC, TRIG, CHOLHDL, LDLDIRECT in the last 8760 hours. TSH: No results for input(s): TSH in the last 8760 hours. A1C: Lab Results  Component Value Date   HGBA1C 6.4 (H) 03/13/2017     Assessment/Plan 1. Essential hypertension Blood pressure stable off lisinopril. conts on cardizem.   2. Cellulitis of left lower extremity Has improved on doxycycline and augmentin. Completed treatment without side effects.   3. Chronic atrial fibrillation (HCC) Rate controlled on cardizem, conts on eliquis for anticoagulation. Of note pt is taking eliquis 5 mg BID due to Cr and age at next visit will need to consider reducing dose to 2.5 mg po BID   4. CKD (chronic kidney disease), stage III - BMP with eGFR  5. Type II diabetes mellitus with peripheral circulatory disorder (HCC) Stable, will cont current regimen,  Total time 40 mins:  time greater than 50% of total time spent doing pt counseled and coordination of care and reviewing hospital records.   Carlos American. Harle Battiest  [facility] [phone] 8 am - 5 pm) [phone] (after hours)

## 2017-04-05 ENCOUNTER — Telehealth: Payer: Self-pay

## 2017-04-05 NOTE — Telephone Encounter (Signed)
Spoke with patient about his labs. While reading over his diagnosis he seen that he has stage 3 kidney disease. He wanted to talk to provider about Stage 3 kidney disease and find out what exactly it meant. Please advice.

## 2017-04-05 NOTE — Telephone Encounter (Signed)
-----   Message from Lauree Chandler, NP sent at 04/05/2017 12:02 PM EDT ----- Kidney function remains stable and has actually improved slightly

## 2017-04-06 DIAGNOSIS — Z89611 Acquired absence of right leg above knee: Secondary | ICD-10-CM | POA: Diagnosis not present

## 2017-04-06 NOTE — Telephone Encounter (Signed)
Left message for patient to call the office regarding his questions about kidney disease.   Patient should be informed that the stage of kidney disease is calculated based on creatinine, age, weight and sex. Once the creatinine level drops below the normal limits, a diagnosis of kidney disease is made. The stage of the disease is based on the level at which the creatinine is measured. Once kidney disease progress from one stage to another, the stages cannot be lowered even though creatinine level has normalized.

## 2017-04-06 NOTE — Telephone Encounter (Signed)
I spoke to patient and explained to him the instructions that were given to me. Patient wants a provider to send him printed information as to what kidney disease is and how to treat it. I informed patient that he could look online and he refused. He stated that it was the responsibility of his provider's to explain the process of kidney disease to him. He did not want to hear it from anyone other than a provider. I tried to explain that a provider had given me instruction as to what to discuss with him and that was not good enough.

## 2017-04-06 NOTE — Telephone Encounter (Signed)
Kidney disease is categorized into stages based on creatine, age and weight and that is how the diagnosis was made.

## 2017-04-06 NOTE — Telephone Encounter (Signed)
Pt should also be advised to stay well hydrated and to avoid the use of NSAIDS.

## 2017-04-07 ENCOUNTER — Encounter: Payer: Self-pay | Admitting: *Deleted

## 2017-04-07 NOTE — Telephone Encounter (Signed)
Attempted to call pt, if he calls back there is no treatment for CKD but ways to help preserve the kidney function. to encourage proper hydration and to avoid drugs that could harm the kidneys such as NSAIDS (Aleve, Advil, Motrin, Ibuprofen). We also dose adjust medications if needed.

## 2017-04-07 NOTE — Telephone Encounter (Signed)
Spoke with patient and advised results, per pt he would like this in writing and mailed to him. As requested letter sent to pt.

## 2017-04-08 ENCOUNTER — Other Ambulatory Visit: Payer: Self-pay | Admitting: Internal Medicine

## 2017-04-11 ENCOUNTER — Telehealth: Payer: Self-pay | Admitting: Nurse Practitioner

## 2017-04-11 NOTE — Telephone Encounter (Signed)
Attempted to call pt twice at his request. See phone note from 5/16. Called pt on 04/05/17 and 03/22/17. No answer. If pt calls back may offer appt if he needs more education or explanation of his chronic kidney disease.

## 2017-04-11 NOTE — Addendum Note (Signed)
Addended by: Lauree Chandler on: 04/11/2017 09:56 AM   Modules accepted: Level of Service

## 2017-04-12 ENCOUNTER — Telehealth (INDEPENDENT_AMBULATORY_CARE_PROVIDER_SITE_OTHER): Payer: Self-pay | Admitting: Orthopedic Surgery

## 2017-04-12 DIAGNOSIS — Z89611 Acquired absence of right leg above knee: Secondary | ICD-10-CM

## 2017-04-12 DIAGNOSIS — R269 Unspecified abnormalities of gait and mobility: Secondary | ICD-10-CM

## 2017-04-12 NOTE — Telephone Encounter (Signed)
Patient called wanting to speak with you about wanting to start PT. CB # [phone]

## 2017-04-13 ENCOUNTER — Encounter: Payer: Self-pay | Admitting: Podiatry

## 2017-04-13 ENCOUNTER — Ambulatory Visit (INDEPENDENT_AMBULATORY_CARE_PROVIDER_SITE_OTHER): Payer: Medicare Other | Admitting: Podiatry

## 2017-04-13 DIAGNOSIS — L97502 Non-pressure chronic ulcer of other part of unspecified foot with fat layer exposed: Secondary | ICD-10-CM

## 2017-04-13 DIAGNOSIS — L97522 Non-pressure chronic ulcer of other part of left foot with fat layer exposed: Secondary | ICD-10-CM | POA: Diagnosis not present

## 2017-04-13 NOTE — Telephone Encounter (Signed)
I called patient and left voicemail to advise that we normally send our patients to Jamey Reas with [facility] for physical therapy. If he would like to proceed, I can place referral order in EPIC and get this started for him.

## 2017-04-14 NOTE — Telephone Encounter (Signed)
I tried calling patient again, to advise of previous message below.

## 2017-04-14 NOTE — Addendum Note (Signed)
Addended by: Maxcine Ham on: 04/14/2017 11:51 AM   Modules accepted: Orders

## 2017-04-15 NOTE — Progress Notes (Signed)
Subjective:    Patient ID: [name], male   DOB: 82 y.o.   MRN: [id number]   HPI patient states I seem to be doing pretty good and I was in the hospital for about a week with cellulitis in my left leg    ROS      Objective:  Physical Exam No change neurovascular status with a fresh breakdown of tissue in the dorsum of the left hallux which is chronic in nature and he is aware of this it is localized with no proximal edema erythema drainage    Assessment:    Severe at risk patient who lost his right leg to infection with changes in the left dorsal     Plan:    Flushed the area entirely and applied Iodosorb with sterile dressing and gave instructions for home care and reappoint for regular visit or earlier if needed

## 2017-04-16 ENCOUNTER — Other Ambulatory Visit: Payer: Self-pay | Admitting: Internal Medicine

## 2017-04-18 ENCOUNTER — Other Ambulatory Visit: Payer: Self-pay | Admitting: Nurse Practitioner

## 2017-04-18 ENCOUNTER — Telehealth: Payer: Self-pay | Admitting: *Deleted

## 2017-04-18 NOTE — Telephone Encounter (Signed)
Patient called and stated that his right middle finger is inflamed and very painful and swollen. Exercise with the hand strengthing tool a couple of days ago. Placed Lidocaine cream on it with no relief. Wonders is he has broken something. Patient is going to the Urgent Care when his wife gets home to have it x-rayed. Agreed.

## 2017-04-19 ENCOUNTER — Ambulatory Visit (HOSPITAL_COMMUNITY)
Admission: EM | Admit: 2017-04-19 | Discharge: 2017-04-19 | Disposition: A | Discharge status: Home / Self Care | Payer: Medicare Other | Attending: Family Medicine | Admitting: Family Medicine

## 2017-04-19 ENCOUNTER — Ambulatory Visit (INDEPENDENT_AMBULATORY_CARE_PROVIDER_SITE_OTHER): Payer: Medicare Other

## 2017-04-19 ENCOUNTER — Encounter (HOSPITAL_COMMUNITY): Payer: Self-pay | Admitting: Family Medicine

## 2017-04-19 DIAGNOSIS — M1000 Idiopathic gout, unspecified site: Secondary | ICD-10-CM | POA: Diagnosis not present

## 2017-04-19 DIAGNOSIS — L03011 Cellulitis of right finger: Secondary | ICD-10-CM

## 2017-04-19 DIAGNOSIS — M7989 Other specified soft tissue disorders: Secondary | ICD-10-CM | POA: Diagnosis not present

## 2017-04-19 MED ORDER — DOXYCYCLINE HYCLATE 100 MG PO CAPS: 100.0000 mg | ORAL_CAPSULE | Freq: Two times a day (BID) | ORAL | 0 refills | Status: DC

## 2017-04-19 MED ORDER — METHYLPREDNISOLONE 4 MG PO TBPK: ORAL_TABLET | ORAL | 0 refills | Status: DC

## 2017-04-19 NOTE — ED Provider Notes (Signed)
CSN: [id number]     Arrival date & time 04/19/17  1708 History   None    Chief Complaint  Patient presents with  . Finger Injury   (Consider location/radiation/quality/duration/timing/severity/associated sxs/prior Treatment) Patient c/o right middle finger swelling erythema and pain.  He has hx of gout.   The history is provided by the patient.  Hand Pain  This is a new problem. The current episode started 2 days ago. The problem occurs constantly. The problem has not changed since onset.Nothing aggravates the symptoms. Nothing relieves the symptoms. He has tried nothing for the symptoms.    Past Medical History:  Diagnosis Date  . Atrial fibrillation ([place])    permanent  . Backache, unspecified   . Cellulitis and abscess of leg, except foot   . CHF (congestive heart failure) ([place])   . Diverticulosis 05-2006   Dr. [name]   . Esophageal stricture   . Fatigue   . GERD (gastroesophageal reflux disease)   . Gout, unspecified   . Hx of colonic polyps 05-2006   (Adenomatous)Dr. [name]  . Hx of radiation therapy 06/16/14- 07/18/14   mult squamous cell carcinomas of scalp  . Hypertension   . Long term (current) use of anticoagulants   . Overweight(278.02)   . Pacemaker- [place] 02/20/2013  . Phlebitis and thrombophlebitis of superficial vessels of lower extremities   . Right bundle branch block   . Skin cancer    squamous cell carcinoma scalp  . Syncope and collapse   . Tachycardia-bradycardia ([place])    s/p PPM  . Type II or unspecified type diabetes mellitus without mention of complication, not stated as uncontrolled   . Unspecified venous (peripheral) insufficiency    Past Surgical History:  Procedure Laterality Date  . ABOVE KNEE LEG AMPUTATION Right 04/02/2014   DR DUDA  . AMPUTATION Right 01/20/2014   Procedure: AMPUTATION BELOW KNEE- right;  Surgeon: [name], MD;  Location: [place];  Service: Orthopedics;  Laterality: Right;  Right Below Knee Amputation  . AMPUTATION  Right 02/14/2014   Procedure: AMPUTATION BELOW KNEE;  Surgeon: [name], MD;  Location: [place];  Service: Orthopedics;  Laterality: Right;  Right Below Knee Amputation Revision  . AMPUTATION Right 03/07/2014   Procedure: AMPUTATION BELOW KNEE;  Surgeon: [name], MD;  Location: [place];  Service: Orthopedics;  Laterality: Right;  Right Below Knee Amputation Revision  . AMPUTATION Right 03/18/2014   Procedure: revision of  AMPUTATION BELOW KNEE;  Surgeon: [name], MD;  Location: [place];  Service: Orthopedics;  Laterality: Right;  Revision Right Below Knee Amputation  . AMPUTATION Right 04/02/2014   Procedure: AMPUTATION ABOVE KNEE;  Surgeon: [name], MD;  Location: [place];  Service: Orthopedics;  Laterality: Right;  Right Above Knee Amputation  . EYE SURGERY    . I&D EXTREMITY Right 01/17/2014   Procedure: IRRIGATION AND DEBRIDEMENT RIGHT HEEL  WITH CULTURES AND BONE BIOPSY, placement of wound vac;  Surgeon: Theodoro Kos, DO;  Location: [place];  Service: Plastics;  Laterality: Right;  . LAMINECTOMY    . LUMBAR FUSION    . PACEMAKER INSERTION  8/12   SJM by [name] for tachy/brady syndrome  . ROTATOR CUFF REPAIR    . ruptured rt rectus muscle    . SKIN BIOPSY     scalp   Family History  Problem Relation Age of Onset  . Pneumonia Mother   . Heart disease Father   . Diabetes  Father   . Colon cancer Neg Hx    Social History  Substance Use Topics  . Smoking status: Former Research scientist (life sciences)  . Smokeless tobacco: Never Used     Comment: Quit at age 41   . Alcohol use No     Comment: none currenlty    Review of Systems  Constitutional: Negative.   HENT: Negative.   Eyes: Negative.   Respiratory: Negative.   Cardiovascular: Negative.   Gastrointestinal: Negative.   Endocrine: Negative.   Genitourinary: Negative.   Musculoskeletal: Positive for arthralgias.  Allergic/Immunologic: Negative.   Neurological: Negative.   Hematological: Negative.   Psychiatric/Behavioral:  Negative.     Allergies  Morphine  Home Medications   Prior to Admission medications   Medication Sig Start Date End Date Taking? Authorizing Provider  apixaban (ELIQUIS) 5 MG TABS tablet Take 1 tablet (5 mg total) by mouth 2 (two) times daily. 03/13/17   Reed, Tiffany L, DO  B-D ULTRAFINE III SHORT PEN 31G X 8 MM MISC USE AS DIRECTED 02/01/17   Reed, Tiffany L, DO  diltiazem (CARDIZEM CD) 180 MG 24 hr capsule Take 1 capsule (180 mg total) by mouth daily. 03/21/16   Reed, Tiffany L, DO  doxycycline (VIBRAMYCIN) 100 MG capsule Take 1 capsule (100 mg total) by mouth 2 (two) times daily. 04/19/17   Lysbeth Penner, FNP  furosemide (LASIX) 20 MG tablet Take 1 tablet (20 mg total) by mouth every other day. 04/03/17   Eugenie Filler, MD  gabapentin (NEURONTIN) 100 MG capsule TAKE 1 CAPSULE EVERY TWELVE HOURS. 04/10/17   Reed, Tiffany L, DO  glucose blood (ACCU-CHEK AVIVA PLUS) test strip DX E11.9, check blood sugar three times daily 04/18/17   Reed, Tiffany L, DO  Insulin Glargine (LANTUS SOLOSTAR) 100 UNIT/ML Solostar Pen Inject 12 Units into the skin at bedtime. 03/25/17   Eugenie Filler, MD  insulin lispro (HUMALOG KWIKPEN) 100 UNIT/ML KiwkPen INJECT 4 UNITS after MEALS for glucose over 150 03/13/17   Reed, Tiffany L, DO  methylPREDNISolone (MEDROL DOSEPAK) 4 MG TBPK tablet Take 6-5-4-3-2-1 po qd 04/19/17   Lysbeth Penner, FNP  Nutritional Supplements (FEEDING SUPPLEMENT, GLUCERNA 1.2 CAL,) LIQD Take 237 mLs by mouth every 30 (thirty) days. Reported on 05/25/2016    [provider]  omeprazole (PRILOSEC) 20 MG capsule TAKE (1) CAPSULE DAILY. 01/25/17   Reed, Tiffany L, DO  Oxycodone HCl 10 MG TABS Take 1 tablet (10 mg total) by mouth every 8 (eight) hours as needed. For pain 03/22/17   Estill Dooms, MD   Meds Ordered and Administered this Visit  Medications - No data to display  BP 128/76   Pulse (!) 103   Temp 98.8 F (37.1 C) (Oral)   Resp 18   SpO2 97%  No data  found.   Physical Exam  Constitutional: He appears well-developed and well-nourished.  WC bound  HENT:  Head: Normocephalic and atraumatic.  Eyes: Conjunctivae and EOM are normal. Pupils are equal, round, and reactive to light.  Neck: Normal range of motion. Neck supple.  Cardiovascular: Normal rate, regular rhythm and normal heart sounds.   Pulmonary/Chest: Effort normal and breath sounds normal.  Musculoskeletal: He exhibits tenderness.  Right middle finger with swelling and erythema  Nursing note and vitals reviewed.   Urgent Care Course     Procedures (including critical care time)  Labs Review Labs Reviewed - No data to display  Imaging Review Dg Finger Middle Right  Result Date:  04/19/2017 CLINICAL DATA:  82 year old male with erythema and swelling the right middle finger. EXAM: RIGHT MIDDLE FINGER 2+V COMPARISON:  None. FINDINGS: There is no acute fracture dislocation. Small calcific density along the dorsal aspect digit at the PIP likely chronic and represent calcification of the extensor tendon. Chronic appearing small bone fragments noted at the MCP joint the middle finger. The bones are osteopenic. There is dislocation. There is diffuse soft tissue swelling the middle finger primarily over the dorsum of the PIP. No radiopaque foreign object or soft tissue gas noted. IMPRESSION: 1. No acute fracture or dislocation. 2. Diffuse soft tissue swelling of the finger. Electronically Signed   By: Anner Crete M.D.   On: 04/19/2017 18:14     Visual Acuity Review  Right Eye Distance:   Left Eye Distance:   Bilateral Distance:    Right Eye Near:   Left Eye Near:    Bilateral Near:         MDM   1. Idiopathic gout, unspecified chronicity, unspecified site   2. Cellulitis of right middle finger    Possibilty this is gout and will tx with medrol dose pack And cellulitis with doxycycline 100mg  one po bid x 10 days    Lysbeth Penner, FNP 04/19/17 [place], A[place], FNP 04/19/17 [phone]

## 2017-04-19 NOTE — ED Triage Notes (Signed)
Pt here for redness and swelling to right middle finger.

## 2017-04-24 ENCOUNTER — Encounter: Payer: Medicare Other | Admitting: Internal Medicine

## 2017-04-26 ENCOUNTER — Other Ambulatory Visit: Payer: Self-pay | Admitting: *Deleted

## 2017-04-26 MED ORDER — ACCU-CHEK SOFTCLIX LANCETS MISC: 3 refills | Status: AC

## 2017-04-26 NOTE — Telephone Encounter (Signed)
[facility] 

## 2017-05-01 ENCOUNTER — Other Ambulatory Visit: Payer: Self-pay | Admitting: *Deleted

## 2017-05-01 MED ORDER — ACCU-CHEK AVIVA VI SOLN: 3 refills | Status: AC

## 2017-05-01 NOTE — Telephone Encounter (Signed)
[facility] 

## 2017-05-09 ENCOUNTER — Other Ambulatory Visit: Payer: Self-pay | Admitting: *Deleted

## 2017-05-09 ENCOUNTER — Telehealth (INDEPENDENT_AMBULATORY_CARE_PROVIDER_SITE_OTHER): Payer: Self-pay | Admitting: Orthopedic Surgery

## 2017-05-09 MED ORDER — OXYCODONE HCL 10 MG PO TABS: 10.0000 mg | ORAL_TABLET | Freq: Three times a day (TID) | ORAL | 0 refills | Status: DC | PRN

## 2017-05-09 NOTE — Telephone Encounter (Signed)
Patient called asked if he can get a Rx for (PT). Patient said [facility]  just called him. The number to contact patient is [phone]

## 2017-05-09 NOTE — Telephone Encounter (Signed)
Patient requested and will pick up 

## 2017-05-13 ENCOUNTER — Other Ambulatory Visit: Payer: Self-pay | Admitting: Internal Medicine

## 2017-05-18 ENCOUNTER — Telehealth (INDEPENDENT_AMBULATORY_CARE_PROVIDER_SITE_OTHER): Payer: Self-pay

## 2017-05-18 NOTE — Telephone Encounter (Signed)
Patient would like a Rx for PT.  Cb# [phone].  Please advise.

## 2017-05-19 ENCOUNTER — Other Ambulatory Visit (INDEPENDENT_AMBULATORY_CARE_PROVIDER_SITE_OTHER): Payer: Self-pay

## 2017-05-19 NOTE — Telephone Encounter (Signed)
Order for physical therapy faxed to [facility] [phone] for pt to have home service for prosthetic gait training as requested.

## 2017-05-26 ENCOUNTER — Telehealth (INDEPENDENT_AMBULATORY_CARE_PROVIDER_SITE_OTHER): Payer: Self-pay | Admitting: Orthopedic Surgery

## 2017-05-26 NOTE — Telephone Encounter (Signed)
Amy with [employer] called left message on voicemail advised patient need a prior auth. To have [place] (PT)  Amy asked for a call back as soon as possible.  The phone number is [phone]   Amy asked that the member be called as well at [phone]

## 2017-05-29 ENCOUNTER — Ambulatory Visit (INDEPENDENT_AMBULATORY_CARE_PROVIDER_SITE_OTHER): Payer: Medicare Other | Admitting: Internal Medicine

## 2017-05-29 ENCOUNTER — Encounter: Payer: Self-pay | Admitting: Internal Medicine

## 2017-05-29 VITALS — BP 120/60 | HR 90 | Temp 98.7°F

## 2017-05-29 DIAGNOSIS — K5903 Drug induced constipation: Secondary | ICD-10-CM | POA: Diagnosis not present

## 2017-05-29 DIAGNOSIS — E11621 Type 2 diabetes mellitus with foot ulcer: Secondary | ICD-10-CM

## 2017-05-29 DIAGNOSIS — T402X5A Adverse effect of other opioids, initial encounter: Secondary | ICD-10-CM | POA: Diagnosis not present

## 2017-05-29 DIAGNOSIS — L97529 Non-pressure chronic ulcer of other part of left foot with unspecified severity: Secondary | ICD-10-CM

## 2017-05-29 DIAGNOSIS — L97401 Non-pressure chronic ulcer of unspecified heel and midfoot limited to breakdown of skin: Secondary | ICD-10-CM

## 2017-05-29 DIAGNOSIS — Z89611 Acquired absence of right leg above knee: Secondary | ICD-10-CM | POA: Diagnosis not present

## 2017-05-29 DIAGNOSIS — R1909 Other intra-abdominal and pelvic swelling, mass and lump: Secondary | ICD-10-CM | POA: Diagnosis not present

## 2017-05-29 DIAGNOSIS — Z0000 Encounter for general adult medical examination without abnormal findings: Secondary | ICD-10-CM

## 2017-05-29 DIAGNOSIS — S78111A Complete traumatic amputation at level between right hip and knee, initial encounter: Secondary | ICD-10-CM

## 2017-05-29 NOTE — Telephone Encounter (Signed)
I called with [employer] and gave cpt codes for home health physical therapy. Advised we have never had to do this before so I'm unaware if they are accurate. [place], [id number]. His medical doctor has placed order for in home physical therapy. Janett Billow advised that [facility] would not require prior authorization with these codes.

## 2017-05-29 NOTE — Progress Notes (Signed)
Location:   [place]  Place of Service:   clinic Provider: Tareva Leske L. Mariea Clonts, D.O., C.M.D.  Patient Care Team: [name], DO as PCP - General (Geriatric Medicine) Ricard Dillon, MD as Consulting Physician (Internal Medicine) Deboraha Sprang, MD as Consulting Physician (Cardiology) Thompson Grayer, MD as Consulting Physician (Cardiology) [name], MD as Consulting Physician (Orthopedic Surgery) Regal, Tamala Fothergill, DPM as Consulting Physician (Podiatry)  Extended Emergency Contact Information Primary Emergency Contact: Begeman,Ann Address: [address]          [place], [place] [address] [place] Phone: [phone] Relation: Spouse Secondary Emergency Contact: [facility] Address: [address]          [place], [place] [address] [place] Phone: [phone] Mobile Phone: [phone] Relation: Daughter  Code Status: DNR Goals of Care: Advanced Directive information Advanced Directives 04/04/2017  Does Patient Have a Medical Advance Directive? Yes  Type of Advance Directive Living will  Does patient want to make changes to medical advance directive? -  Copy of [place] in Chart? -  Pre-existing out of facility DNR order (yellow form or pink MOST form) -   Chief Complaint  Patient presents with  . Annual Exam    wellness exam  . MMSE    30/30 passed clock    HPI: Patient is a 82 y.o. male seen in today for an annual wellness exam.    He is to try his new prosthesis which he has at home and he wore the part that goes on the stump.  It's a different type than he did have.  He needs PT at home now.    He continues to see his podiatrist re: his great toe ulcer which does not heal due to his PAD.  He has lost all of his toenails and has dry scaly skin of his foot and leg and serous drainage of his great toe with probable bone.  He has a lump in his mid to left side of abdomen near umbilicus.  Has not  exercised to cause this.  He has no pain there.  He reports he had at one time had a tear in his rectus muscle.  This mass is not reducible.  He noticed it there 3-4 mos ago.  No blood in stool or dark stool.  Weighed 180 at Dr. Olin Pia office for pacemaker check. No change in bowel habits, blood in stools or dark stools.  No weight loss.  His back hurts him rather acutely at times and he wonders if the mass helps.  The oxycodone will take away the pain.  Has chronic constipation with little rocks--takes one senna and two stool softeners.  Will take prune juice and milk of mag mixed together if it gets really bad.  Depression screen [facility] 2/9 05/29/2017 01/12/2017 07/18/2016 01/14/2016 06/29/2015  Decreased Interest 0 0 0 0 0  Down, Depressed, Hopeless 0 0 0 0 0  PHQ - 2 Score 0 0 0 0 0  Some recent data might be hidden    Fall Risk  05/29/2017 04/04/2017 01/12/2017 10/20/2016 07/18/2016  Falls in the past year? No No Exclusion - non ambulatory No No  Number falls in past yr: - - - - -  Risk for fall due to : - - Impaired mobility - -   MMSE - Mini Mental State Exam 05/29/2017 01/12/2017 01/14/2016 12/29/2014  Not completed: - - (No Data) -  Orientation to time 5 5 5 5   Orientation to Place 5 4 5 5   Registration 3 3 3 3   Attention/ Calculation 5 5 5 5   Recall 3 3 3 3   Language- name 2 objects 2 2 2 2   Language- repeat 1 1 1 1   Language- follow 3 step command 3 3 3 3   Language- read & follow direction 1 1 1 1   Write a sentence 1 1 1 1   Copy design 1 1 1 1   Total score 30 29 30 30    Health Maintenance  Topic Date Due  . TETANUS/TDAP  11/22/2011  . OPHTHALMOLOGY EXAM  10/22/2015  . FOOT EXAM  03/14/2017  . INFLUENZA VACCINE  06/21/2017  . HEMOGLOBIN A1C  09/12/2017  . PNA vac Low Risk Adult  Completed   Urinary incontinence?  No problems Functional Status Survey: Is the patient deaf or have difficulty hearing?: No Does the patient have difficulty seeing, even when wearing glasses/contacts?:  No Does the patient have difficulty concentrating, remembering, or making decisions?: No Does the patient have difficulty walking or climbing stairs?: Yes Does the patient have difficulty dressing or bathing?: No Does the patient have difficulty doing errands alone such as visiting a doctor's office or shopping?: Yes Exercise?  Yes, uses gym in building using for upper body strength, has grip strength machines, as well and puddy Diet?  Follows his diabetic diet except for his wife's bday they have had a cake and cupcakes No exam data presentVision:  Has occasional floaters.  Sees Dr. Elpidio Eric him each fall.  Is an avid reader so keeps careful watch on this. Hearing:  No problems. Dentition:  Teeth are terrible, but has managed to keep them.  Has some foods he cannot eat.  A few missing and a few missing fillings.  Does see dentist, but they agreed not to do anything with them.   Pain:  He says his phantom pain is hysterical.  Has his gabapentin for it, but not bothersome.  Back and shoulder bother him and he takes his "little pink" oxycodone very carefully for that.  He does not use them all, but refills Rx b/c he does not want to run out if he has a flare up. Last smoked in college.  Past Medical History:  Diagnosis Date  . Atrial fibrillation ([place])    permanent  . Backache, unspecified   . Cellulitis and abscess of leg, except foot   . CHF (congestive heart failure) ([place])   . Diverticulosis 05-2006   Dr. [name]   . Esophageal stricture   . Fatigue   . GERD (gastroesophageal reflux disease)   . Gout, unspecified   . Hx of colonic polyps 05-2006   (Adenomatous)Dr. [name]  . Hx of radiation therapy 06/16/14- 07/18/14   mult squamous cell carcinomas of scalp  . Hypertension   . Long term (current) use of anticoagulants   . Overweight(278.02)   . Pacemaker- [place] 02/20/2013  . Phlebitis and thrombophlebitis of superficial vessels of lower extremities   . Right bundle branch block    . Skin cancer    squamous cell carcinoma scalp  . Syncope and collapse   . Tachycardia-bradycardia ([place])    s/p PPM  . Type II or unspecified type diabetes mellitus without mention of complication, not stated as uncontrolled   . Unspecified venous (peripheral) insufficiency     Past Surgical History:  Procedure Laterality Date  . ABOVE KNEE LEG AMPUTATION Right 04/02/2014   DR  DUDA  . AMPUTATION Right 01/20/2014   Procedure: AMPUTATION BELOW KNEE- right;  Surgeon: [name], MD;  Location: [facility];  Service: Orthopedics;  Laterality: Right;  Right Below Knee Amputation  . AMPUTATION Right 02/14/2014   Procedure: AMPUTATION BELOW KNEE;  Surgeon: [name], MD;  Location: [facility];  Service: Orthopedics;  Laterality: Right;  Right Below Knee Amputation Revision  . AMPUTATION Right 03/07/2014   Procedure: AMPUTATION BELOW KNEE;  Surgeon: [name], MD;  Location: [facility];  Service: Orthopedics;  Laterality: Right;  Right Below Knee Amputation Revision  . AMPUTATION Right 03/18/2014   Procedure: revision of  AMPUTATION BELOW KNEE;  Surgeon: [name], MD;  Location: [facility];  Service: Orthopedics;  Laterality: Right;  Revision Right Below Knee Amputation  . AMPUTATION Right 04/02/2014   Procedure: AMPUTATION ABOVE KNEE;  Surgeon: [name], MD;  Location: [facility];  Service: Orthopedics;  Laterality: Right;  Right Above Knee Amputation  . EYE SURGERY    . I&D EXTREMITY Right 01/17/2014   Procedure: IRRIGATION AND DEBRIDEMENT RIGHT HEEL  WITH CULTURES AND BONE BIOPSY, placement of wound vac;  Surgeon: Theodoro Kos, DO;  Location: [facility];  Service: Plastics;  Laterality: Right;  . LAMINECTOMY    . LUMBAR FUSION    . PACEMAKER INSERTION  8/12   SJM by [name] for tachy/brady syndrome  . ROTATOR CUFF REPAIR    . ruptured rt rectus muscle    . SKIN BIOPSY     scalp    Family History  Problem Relation Age of Onset  . Pneumonia Mother   . Heart disease Father   . Diabetes Father   .  Colon cancer Neg Hx     Social History   Social History  . Marital status: Married    Spouse name: [name]  . Number of children: 2  . Years of education: [name]   Occupational History  . Retired Hotel manager    Social History Main Topics  . Smoking status: Former Research scientist (life sciences)  . Smokeless tobacco: Never Used     Comment: Quit at age 51   . Alcohol use No     Comment: none currenlty  . Drug use: No  . Sexual activity: Not Currently   Other Topics Concern  . None   Social History Narrative   2 caffeine drinks daily    Stopped smoking 1963   Married   Exercise bar bells (R) leg amputation   Alcohol none        reports that he has quit smoking. He has never used smokeless tobacco. He reports that he does not drink alcohol or use drugs.   Allergies  Allergen Reactions  . Morphine Rash and Other (See Comments)    Irritability also    Allergies as of 05/29/2017      Reactions   Morphine Rash, Other (See Comments)   Irritability also      Medication List       Accurate as of 05/29/17  2:14 PM. Always use your most recent med list.          ACCU-CHEK AVIVA Soln Use as Directed. Dx E11.9   ACCU-CHEK SOFTCLIX LANCETS lancets Use to check blood sugar three times daily. Dx E11.9   apixaban 5 MG Tabs tablet Commonly known as:  ELIQUIS Take 1 tablet (5 mg total) by mouth 2 (two) times daily.   B-D ULTRAFINE III SHORT PEN 31G X 8 MM Misc Generic  drug:  Insulin Pen Needle USE AS DIRECTED   diltiazem 180 MG 24 hr capsule Commonly known as:  CARDIZEM CD TAKE (1) CAPSULE DAILY.   feeding supplement (GLUCERNA 1.2 CAL) Liqd Take 237 mLs by mouth every 30 (thirty) days. Reported on 05/25/2016   furosemide 20 MG tablet Commonly known as:  LASIX Take 1 tablet (20 mg total) by mouth every other day.   gabapentin 100 MG capsule Commonly known as:  NEURONTIN TAKE 1 CAPSULE EVERY TWELVE HOURS.   glucose blood test strip Commonly known as:  ACCU-CHEK AVIVA PLUS DX E11.9, check  blood sugar three times daily   Insulin Glargine 100 UNIT/ML Solostar Pen Commonly known as:  LANTUS SOLOSTAR Inject 12 Units into the skin at bedtime.   insulin lispro 100 UNIT/ML KiwkPen Commonly known as:  HUMALOG KWIKPEN INJECT 4 UNITS after MEALS for glucose over 150   omeprazole 20 MG capsule Commonly known as:  PRILOSEC TAKE (1) CAPSULE DAILY.   Oxycodone HCl 10 MG Tabs Take 1 tablet (10 mg total) by mouth every 8 (eight) hours as needed. For pain        Review of Systems:  Review of Systems  Constitutional: Negative for activity change, appetite change, chills, fatigue and fever.  HENT: Negative for congestion.   Eyes: Negative for visual disturbance.       Glasses  Respiratory: Negative for chest tightness and shortness of breath.   Cardiovascular: Negative for chest pain and leg swelling.  Gastrointestinal: Negative for abdominal distention, abdominal pain, blood in stool, constipation, diarrhea and nausea.  Genitourinary: Negative for dysuria.  Musculoskeletal: Positive for gait problem.       Right BKA  Neurological: Negative for dizziness and weakness.  Hematological: Does not bruise/bleed easily.  Psychiatric/Behavioral: Negative for agitation, behavioral problems, confusion and sleep disturbance.    Physical Exam: Vitals:   05/29/17 1336  BP: 120/60  Pulse: 90  Temp: 98.7 F (37.1 C)  TempSrc: Oral  SpO2: 96%   There is no height or weight on file to calculate BMI. Physical Exam  Constitutional: He is oriented to person, place, and time. He appears well-developed and well-nourished. No distress.  Cardiovascular:  irreg irreg  Pulmonary/Chest: Effort normal and breath sounds normal. No respiratory distress. He has no rales.  Abdominal: Soft. Bowel sounds are normal. He exhibits no distension. There is no tenderness.  Musculoskeletal: Normal range of motion.  R BKA  Neurological: He is alert and oriented to person, place, and time.  Skin: Skin is  warm.  great toe ulcer which does not heal due to his PAD.  He has lost all of his toenails and has dry scaly skin of his foot and leg and serous drainage of his great toe with probable bone.   Psychiatric: He has a normal mood and affect.    Labs reviewed: Basic Metabolic Panel:  Recent Labs  03/24/17 0410 03/25/17 0343 04/04/17 0324  NA 135 136 136  K 4.2 4.1 4.2  CL 106 106 103  CO2 21* 21* 23  GLUCOSE 206* 166* 130*  BUN 27* 19 18  CREATININE 1.98* 1.71* 1.61*  CALCIUM 8.3* 8.5* 9.0   Liver Function Tests:  Recent Labs  03/06/17 0511 03/13/17 0305 03/22/17 2023  AST 19 11 25   ALT 9* 7* 16*  ALKPHOS 77 78 109  BILITOT 0.5 0.6 0.8  PROT 6.8 6.6 7.9  ALBUMIN 3.2* 3.5* 3.8   No results for input(s): LIPASE, AMYLASE in the last 8760 hours.  No results for input(s): AMMONIA in the last 8760 hours. CBC:  Recent Labs  03/06/17 0511 03/13/17 0305 03/22/17 2023 03/23/17 0423 03/24/17 0410 03/25/17 0343  WBC 9.7 7.7 23.0* 21.4* 9.3 8.2  NEUTROABS 7.7 5,544 20.2*  --   --   --   HGB 11.1* 11.0* 12.3* 9.9* 9.9* 10.1*  HCT 34.0* 33.6* 37.2* 30.7* 31.0* 31.0*  MCV 83.1 83.8 81.8 82.3 82.2 82.4  PLT 204 229 258 201 173 183   Lipid Panel: No results for input(s): CHOL, HDL, LDLCALC, TRIG, CHOLHDL, LDLDIRECT in the last 8760 hours. Lab Results  Component Value Date   HGBA1C 6.4 (H) 03/13/2017    Assessment/Plan 1. Annual physical exam - performed today - Ambulatory referral to [facility]  2. Medicare annual wellness visit, subsequent -performed today as above and in tables in encounter - Ambulatory referral to [facility]  3. Unilateral AKA, right (HCC) - still present, he has requested a home health referral for PT for his right AKA with new prosthesis he has obtained--needs gait and stair training, also would benefit from RN to assess and manage his great toe wound on left (followed by podiatry for chronic osteo) - Ambulatory referral to [facility]  4.  Central abdominal mass - new onset in past 3-4 mos by his report, larger than a grapefruit around his umbilical area, does not change with BMs so not stool from his constipation - CT Abdomen Pelvis Wo Contrast; Future to determine what this mass is -does not have red flags to suggest malignancy, but quite concerning to have such a large mass suddenly -also does not have any difficulty passing his urine  5. Diabetic ulcer left great toe: -cannot get rid of incorrect diagnosis selected due to lab associated with it in encounter, but wound in on left great toe not midfoot -he has been advised to soak the leg/foot per podiatry and he's following with them for it, has chronic osteomyelitis with prior right AKA  6. Therapeutic opioid-induced constipation (OIC) -cont senna, colace and prune juice with mom when needed which has been effective by report  Labs/tests ordered:   Orders Placed This Encounter  Procedures  . CT Abdomen Pelvis Wo Contrast    180lbs/ pt is amputee, will need something for balance, can stand and pivot MCR Complete/ Epic order  JTB/pt     Standing Status:   Future    Standing Expiration Date:   08/29/2018    Order Specific Question:   Reason for Exam (SYMPTOM  OR DIAGNOSIS REQUIRED)    Answer:   large midabdominal mass, hard, nontender    Order Specific Question:   Preferred imaging location?    Answer:   [facility]    Order Specific Question:   Radiology Contrast Protocol - do NOT remove file path    Answer:   [email or web address]  . Microalbumin / creatinine urine ratio    Standing Status:   Future    Standing Expiration Date:   06/20/2017  . Lipid panel    Standing Status:   Future    Standing Expiration Date:   06/20/2017  . Ambulatory referral to Home Health    Referral Priority:   Routine    Referral Type:   Home Health Care    Referral Reason:   Specialty Services Required    Requested Specialty:   [place]     Number of Visits Requested:   1    Next appt:  06/19/2017  Paige Vanderwoude L. Durand Wittmeyer, D.O. [facility] [address], [place] [address] Cell Phone (Mon-Fri 8am-5pm):  [phone] On Call:  [phone] & follow prompts after 5pm & weekends Office Phone:  [phone] Office Fax:  [phone]

## 2017-06-01 ENCOUNTER — Ambulatory Visit
Admission: RE | Admit: 2017-06-01 | Discharge: 2017-06-01 | Disposition: A | Discharge status: Home / Self Care | Payer: Medicare Other | Source: Ambulatory Visit | Attending: Internal Medicine | Admitting: Internal Medicine

## 2017-06-01 ENCOUNTER — Other Ambulatory Visit: Payer: Medicare Other

## 2017-06-01 DIAGNOSIS — L97401 Non-pressure chronic ulcer of unspecified heel and midfoot limited to breakdown of skin: Secondary | ICD-10-CM | POA: Diagnosis not present

## 2017-06-01 DIAGNOSIS — E11621 Type 2 diabetes mellitus with foot ulcer: Secondary | ICD-10-CM | POA: Diagnosis not present

## 2017-06-01 DIAGNOSIS — L97529 Non-pressure chronic ulcer of other part of left foot with unspecified severity: Secondary | ICD-10-CM | POA: Diagnosis not present

## 2017-06-01 DIAGNOSIS — R1909 Other intra-abdominal and pelvic swelling, mass and lump: Secondary | ICD-10-CM

## 2017-06-01 DIAGNOSIS — K5720 Diverticulitis of large intestine with perforation and abscess without bleeding: Secondary | ICD-10-CM | POA: Diagnosis not present

## 2017-06-02 LAB — LIPID PANEL
Cholesterol: 170 mg/dL (ref <200)
HDL: 50 mg/dL (ref >40)
LDL Cholesterol: 106 mg/dL — ABNORMAL HIGH (ref <100)
Total CHOL/HDL Ratio: 3.4 Ratio (ref <5.0)
Triglycerides: 70 mg/dL (ref <150)
VLDL: 14 mg/dL (ref <30)

## 2017-06-02 LAB — MICROALBUMIN / CREATININE URINE RATIO
Creatinine, Urine: 48 mg/dL (ref 20–370)
Microalb Creat Ratio: 1163 mcg/mg creat — ABNORMAL HIGH (ref <30)
Microalb, Ur: 55.8 mg/dL

## 2017-06-02 LAB — SEDIMENTATION RATE: Sed Rate: 28 mm/hr — ABNORMAL HIGH (ref 0–20)

## 2017-06-05 ENCOUNTER — Other Ambulatory Visit: Payer: Self-pay | Admitting: Internal Medicine

## 2017-06-06 DIAGNOSIS — I110 Hypertensive heart disease with heart failure: Secondary | ICD-10-CM | POA: Diagnosis not present

## 2017-06-06 DIAGNOSIS — E1151 Type 2 diabetes mellitus with diabetic peripheral angiopathy without gangrene: Secondary | ICD-10-CM | POA: Diagnosis not present

## 2017-06-06 DIAGNOSIS — I509 Heart failure, unspecified: Secondary | ICD-10-CM | POA: Diagnosis not present

## 2017-06-06 DIAGNOSIS — Z44111 Encounter for fitting and adjustment of complete right artificial leg: Secondary | ICD-10-CM | POA: Diagnosis not present

## 2017-06-06 DIAGNOSIS — I482 Chronic atrial fibrillation: Secondary | ICD-10-CM | POA: Diagnosis not present

## 2017-06-08 ENCOUNTER — Other Ambulatory Visit: Payer: Self-pay

## 2017-06-08 DIAGNOSIS — Z44111 Encounter for fitting and adjustment of complete right artificial leg: Secondary | ICD-10-CM | POA: Diagnosis not present

## 2017-06-08 DIAGNOSIS — I110 Hypertensive heart disease with heart failure: Secondary | ICD-10-CM | POA: Diagnosis not present

## 2017-06-08 DIAGNOSIS — I482 Chronic atrial fibrillation: Secondary | ICD-10-CM | POA: Diagnosis not present

## 2017-06-08 DIAGNOSIS — E1151 Type 2 diabetes mellitus with diabetic peripheral angiopathy without gangrene: Secondary | ICD-10-CM | POA: Diagnosis not present

## 2017-06-08 DIAGNOSIS — I509 Heart failure, unspecified: Secondary | ICD-10-CM | POA: Diagnosis not present

## 2017-06-08 MED ORDER — OXYCODONE HCL 10 MG PO TABS: 10.0000 mg | ORAL_TABLET | Freq: Three times a day (TID) | ORAL | 0 refills | Status: DC | PRN

## 2017-06-15 ENCOUNTER — Ambulatory Visit: Payer: Medicare Other | Admitting: Podiatry

## 2017-06-19 ENCOUNTER — Ambulatory Visit: Payer: Medicare Other | Admitting: Internal Medicine

## 2017-06-20 DIAGNOSIS — E1151 Type 2 diabetes mellitus with diabetic peripheral angiopathy without gangrene: Secondary | ICD-10-CM | POA: Diagnosis not present

## 2017-06-20 DIAGNOSIS — I509 Heart failure, unspecified: Secondary | ICD-10-CM | POA: Diagnosis not present

## 2017-06-20 DIAGNOSIS — I110 Hypertensive heart disease with heart failure: Secondary | ICD-10-CM | POA: Diagnosis not present

## 2017-06-20 DIAGNOSIS — Z44111 Encounter for fitting and adjustment of complete right artificial leg: Secondary | ICD-10-CM | POA: Diagnosis not present

## 2017-06-20 DIAGNOSIS — I482 Chronic atrial fibrillation: Secondary | ICD-10-CM | POA: Diagnosis not present

## 2017-06-21 ENCOUNTER — Encounter: Payer: Self-pay | Admitting: Internal Medicine

## 2017-06-21 ENCOUNTER — Telehealth: Payer: Self-pay | Admitting: *Deleted

## 2017-06-21 ENCOUNTER — Ambulatory Visit (INDEPENDENT_AMBULATORY_CARE_PROVIDER_SITE_OTHER): Payer: Medicare Other | Admitting: Internal Medicine

## 2017-06-21 VITALS — BP 132/50 | HR 91 | Ht 70.0 in | Wt 176.0 lb

## 2017-06-21 DIAGNOSIS — I495 Sick sinus syndrome: Secondary | ICD-10-CM | POA: Diagnosis not present

## 2017-06-21 DIAGNOSIS — Z950 Presence of cardiac pacemaker: Secondary | ICD-10-CM | POA: Diagnosis not present

## 2017-06-21 DIAGNOSIS — I482 Chronic atrial fibrillation: Secondary | ICD-10-CM

## 2017-06-21 DIAGNOSIS — I4821 Permanent atrial fibrillation: Secondary | ICD-10-CM

## 2017-06-21 NOTE — Telephone Encounter (Signed)
Shree with [facility] called and stated that they needed a verbal for 1X1wk because patient missed an appointment last week. Verbal order given.

## 2017-06-21 NOTE — Progress Notes (Signed)
Patient Care Team: [name], DO as PCP - General (Geriatric Medicine) Ricard Dillon, MD as Consulting Physician (Internal Medicine) Deboraha Sprang, MD as Consulting Physician (Cardiology) Thompson Grayer, MD as Consulting Physician (Cardiology) [name], MD as Consulting Physician (Orthopedic Surgery) Wallene Huh, DPM as Consulting Physician (Podiatry)   HPI  [name] is a 82 y.o. male Seen in followup for pacemaker implanted 2012   He has atrial fibrillation and had a stroke/TIA 22,014  4/15 Echo EF normal  He is doing relatively well without complaints of significant SOB    He si s./p  AKA 2015   Date Cr Hgb  5/18 1.61 10.1   Denies chest pain and sob  no edema  No bleeding    Past Medical History:  Diagnosis Date  . Atrial fibrillation ([place])    permanent  . Backache, unspecified   . Cellulitis and abscess of leg, except foot   . CHF (congestive heart failure) ([place])   . Diverticulosis 05-2006   Dr. [name]   . Esophageal stricture   . Fatigue   . GERD (gastroesophageal reflux disease)   . Gout, unspecified   . Hx of colonic polyps 05-2006   (Adenomatous)Dr. [name]  . Hx of radiation therapy 06/16/14- 07/18/14   mult squamous cell carcinomas of scalp  . Hypertension   . Long term (current) use of anticoagulants   . Overweight(278.02)   . Pacemaker- [place] 02/20/2013  . Phlebitis and thrombophlebitis of superficial vessels of lower extremities   . Right bundle branch block   . Skin cancer    squamous cell carcinoma scalp  . Syncope and collapse   . Tachycardia-bradycardia ([place])    s/p PPM  . Type II or unspecified type diabetes mellitus without mention of complication, not stated as uncontrolled   . Unspecified venous (peripheral) insufficiency     Past Surgical History:  Procedure Laterality Date  . ABOVE KNEE LEG AMPUTATION Right 04/02/2014   DR DUDA  . AMPUTATION Right 01/20/2014   Procedure: AMPUTATION BELOW KNEE- right;   Surgeon: [name], MD;  Location: [facility];  Service: Orthopedics;  Laterality: Right;  Right Below Knee Amputation  . AMPUTATION Right 02/14/2014   Procedure: AMPUTATION BELOW KNEE;  Surgeon: [name], MD;  Location: [facility];  Service: Orthopedics;  Laterality: Right;  Right Below Knee Amputation Revision  . AMPUTATION Right 03/07/2014   Procedure: AMPUTATION BELOW KNEE;  Surgeon: [name], MD;  Location: [facility];  Service: Orthopedics;  Laterality: Right;  Right Below Knee Amputation Revision  . AMPUTATION Right 03/18/2014   Procedure: revision of  AMPUTATION BELOW KNEE;  Surgeon: [name], MD;  Location: [facility];  Service: Orthopedics;  Laterality: Right;  Revision Right Below Knee Amputation  . AMPUTATION Right 04/02/2014   Procedure: AMPUTATION ABOVE KNEE;  Surgeon: [name], MD;  Location: [facility];  Service: Orthopedics;  Laterality: Right;  Right Above Knee Amputation  . EYE SURGERY    . I&D EXTREMITY Right 01/17/2014   Procedure: IRRIGATION AND DEBRIDEMENT RIGHT HEEL  WITH CULTURES AND BONE BIOPSY, placement of wound vac;  Surgeon: Theodoro Kos, DO;  Location: [facility];  Service: Plastics;  Laterality: Right;  . LAMINECTOMY    . LUMBAR FUSION    . PACEMAKER INSERTION  8/12   SJM by [name] for tachy/brady syndrome  . ROTATOR CUFF REPAIR    . ruptured rt rectus muscle    .  SKIN BIOPSY     scalp    Current Outpatient Prescriptions  Medication Sig Dispense Refill  . ACCU-CHEK SOFTCLIX LANCETS lancets Use to check blood sugar three times daily. Dx E11.9 300 each 3  . apixaban (ELIQUIS) 5 MG TABS tablet Take 1 tablet (5 mg total) by mouth 2 (two) times daily. 180 tablet 3  . B-D ULTRAFINE III SHORT PEN 31G X 8 MM MISC USE AS DIRECTED 100 each 11  . Blood Glucose Calibration (ACCU-CHEK AVIVA) SOLN Use as Directed. Dx E11.9 3 each 3  . diltiazem (CARDIZEM CD) 180 MG 24 hr capsule TAKE (1) CAPSULE DAILY. 90 capsule 1  . furosemide (LASIX) 20 MG tablet TAKE ONE TABLET EVERY OTHER  DAY. 45 tablet 3  . gabapentin (NEURONTIN) 100 MG capsule TAKE 1 CAPSULE EVERY TWELVE HOURS. 180 capsule 0  . glucose blood (ACCU-CHEK AVIVA PLUS) test strip DX E11.9, check blood sugar three times daily 300 each 11  . Insulin Glargine (LANTUS SOLOSTAR) 100 UNIT/ML Solostar Pen Inject 12 Units into the skin at bedtime. 15 mL 0  . insulin lispro (HUMALOG KWIKPEN) 100 UNIT/ML KiwkPen INJECT 4 UNITS after MEALS for glucose over 150 15 mL 5  . Nutritional Supplements (FEEDING SUPPLEMENT, GLUCERNA 1.2 CAL,) LIQD Take 237 mLs by mouth every 30 (thirty) days. Reported on 05/25/2016    . omeprazole (PRILOSEC) 20 MG capsule TAKE (1) CAPSULE DAILY. 90 capsule 3  . Oxycodone HCl 10 MG TABS Take 1 tablet (10 mg total) by mouth every 8 (eight) hours as needed. For pain 90 tablet 0   No current facility-administered medications for this visit.     Allergies  Allergen Reactions  . Morphine Rash and Other (See Comments)    Irritability also    Review of Systems negative except from HPI and PMH  Physical Exam BP (!) 132/50   Pulse 91   Ht 5\' 10"  (1.778 m)   Wt 176 lb (79.8 kg)   SpO2 98%   BMI 25.25 kg/m  Well developed and nourished in no acute distress HENT normal Neck supple with JVP-flat Clear Irregular rate and rhythm, no murmurs or gallops Abd-soft with active BS No Clubbing cyanosis edema  S/p R AKA  Skin-warm and dry A & Oriented  Grossly normal sensory and motor function  #CG * AFib 78  -/13/41  Assessment and  Plan  Atrial fib  Renal insufficiency grade 3    Pacemaker  The patient's device was interrogated.  The information was reviewed. No changes were made in the programming.     On Anticoagulation;  No bleeding issues   He was concerned about a abdominal mass. He said that he got report regarding the CT scan. I reviewed the CT scan report with him. It does not address the issue of his concern. I suggested that he follow-up with his dermatologist.

## 2017-06-21 NOTE — Patient Instructions (Addendum)

## 2017-06-22 ENCOUNTER — Ambulatory Visit (INDEPENDENT_AMBULATORY_CARE_PROVIDER_SITE_OTHER): Payer: Medicare Other | Admitting: Podiatry

## 2017-06-22 ENCOUNTER — Encounter: Payer: Self-pay | Admitting: Podiatry

## 2017-06-22 DIAGNOSIS — I739 Peripheral vascular disease, unspecified: Secondary | ICD-10-CM | POA: Diagnosis not present

## 2017-06-22 DIAGNOSIS — L97522 Non-pressure chronic ulcer of other part of left foot with fat layer exposed: Secondary | ICD-10-CM | POA: Diagnosis not present

## 2017-06-22 LAB — CUP PACEART INCLINIC DEVICE CHECK
Brady Statistic RV Percent Paced: 9.9 %
Lead Channel Impedance Value: 537.5 Ohm
Lead Channel Pacing Threshold Amplitude: 0.75 V
Lead Channel Sensing Intrinsic Amplitude: 10.8 mV
Lead Channel Setting Pacing Pulse Width: 0.4 ms
Lead Channel Setting Sensing Sensitivity: 2 mV
MDC IDC LEAD IMPLANT DT: 20120817
MDC IDC LEAD LOCATION: 753860
MDC IDC MSMT BATTERY REMAINING LONGEVITY: 126 mo
MDC IDC MSMT BATTERY VOLTAGE: 2.95 V
MDC IDC MSMT LEADCHNL RV PACING THRESHOLD PULSEWIDTH: 0.4 ms
MDC IDC PG IMPLANT DT: 20120817
MDC IDC SESS DTM: 20180801202119
MDC IDC SET LEADCHNL RV PACING AMPLITUDE: 2 V
Pulse Gen Model: 1210
Pulse Gen Serial Number: 7244744

## 2017-06-23 DIAGNOSIS — E1151 Type 2 diabetes mellitus with diabetic peripheral angiopathy without gangrene: Secondary | ICD-10-CM | POA: Diagnosis not present

## 2017-06-23 DIAGNOSIS — I509 Heart failure, unspecified: Secondary | ICD-10-CM | POA: Diagnosis not present

## 2017-06-23 DIAGNOSIS — I110 Hypertensive heart disease with heart failure: Secondary | ICD-10-CM | POA: Diagnosis not present

## 2017-06-23 DIAGNOSIS — I482 Chronic atrial fibrillation: Secondary | ICD-10-CM | POA: Diagnosis not present

## 2017-06-23 DIAGNOSIS — Z44111 Encounter for fitting and adjustment of complete right artificial leg: Secondary | ICD-10-CM | POA: Diagnosis not present

## 2017-06-23 NOTE — Progress Notes (Signed)
Subjective:    Patient ID: [name], male   DOB: 82 y.o.   MRN: [id number]   HPI Patient states doing well with left foot with area crusted over but no current infection is not been taking antibiotics   ROS      Objective:  Physical Exam neurovascular status left unchanged with ulceration of the left hallux that's been present for a long time but has remained in this state and something we cannot do the amputation due to the circulatory status the patient     Assessment:    Chronic ulceration of the left hallux     Plan:    Reviewed with patient debrided tissue a sharp sterile instrumentation flushed the wound and applied Iodosorb with sterile dressing. Continue home soaks and reappoint 8 weeks or earlier if needed

## 2017-06-29 DIAGNOSIS — I110 Hypertensive heart disease with heart failure: Secondary | ICD-10-CM | POA: Diagnosis not present

## 2017-06-29 DIAGNOSIS — E1151 Type 2 diabetes mellitus with diabetic peripheral angiopathy without gangrene: Secondary | ICD-10-CM | POA: Diagnosis not present

## 2017-06-29 DIAGNOSIS — I509 Heart failure, unspecified: Secondary | ICD-10-CM | POA: Diagnosis not present

## 2017-06-29 DIAGNOSIS — I482 Chronic atrial fibrillation: Secondary | ICD-10-CM | POA: Diagnosis not present

## 2017-06-29 DIAGNOSIS — Z44111 Encounter for fitting and adjustment of complete right artificial leg: Secondary | ICD-10-CM | POA: Diagnosis not present

## 2017-07-04 ENCOUNTER — Ambulatory Visit (INDEPENDENT_AMBULATORY_CARE_PROVIDER_SITE_OTHER): Payer: Medicare Other | Admitting: Podiatry

## 2017-07-04 ENCOUNTER — Encounter: Payer: Self-pay | Admitting: Podiatry

## 2017-07-04 VITALS — Temp 99.5°F

## 2017-07-04 DIAGNOSIS — Z89611 Acquired absence of right leg above knee: Secondary | ICD-10-CM

## 2017-07-04 DIAGNOSIS — E11621 Type 2 diabetes mellitus with foot ulcer: Secondary | ICD-10-CM

## 2017-07-04 DIAGNOSIS — L97522 Non-pressure chronic ulcer of other part of left foot with fat layer exposed: Secondary | ICD-10-CM | POA: Diagnosis not present

## 2017-07-04 MED ORDER — CEPHALEXIN 500 MG PO CAPS
500.0000 mg | ORAL_CAPSULE | Freq: Three times a day (TID) | ORAL | 0 refills | Status: DC
Start: 2017-07-04 — End: 2018-07-10

## 2017-07-04 NOTE — Progress Notes (Signed)
This patient presents the office stating that his big toe left foot has become red and inflamed.  He says that his left big toe and left leg became red and inflamed and he developed a temperature of 102.5 by midnight.  He called the office this morning stating he believed he was developing and cellulitis in both his left big toe and left leg.  He says these 2 areas became red and inflamed and painful.  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  He has been under the care of Dr. [name] for a chronic diabetic ulcer. The distal aspect of the left hallux.  Patient has been treated with dissection and bandaging for the chronic diabetic ulcer left hallux at his previous visit.  He also gives a history of an infection that was treated with antibiotics.  It was noted that in 2015 cephalexin have been ordered and patient had responded well to this antibiotic. He presents the office today because of his fragile medical history and the open diabetic ulcers on his left foot.  He also says that he has a problem of the second toe left foot at the site of the nail  . Finally, he has a healing diabetic ulcer on the top of the third toe, left foot.  He presents the office for an evaluation for this acute flareup cellulitis.  He does admit that the dramatically red and inflamed tissue in both the leg and the toe has diminished. He presents the office today for an evaluation of his left foot   GENERAL APPEARANCE: Alert, conversant. Appropriately groomed. No acute distress.  VASCULAR: Pedal pulses are not   palpable at  [facility] and PT bilateral.  Capillary refill time is diminished  to all digits. NEUROLOGIC: sensation is diminished  to 5.07 monofilament at 5/5 sites left foot.[place] touch is intact left foot. Muscle strength normal left foot. MUSCULOSKELETAL: acceptable muscle strength, tone and stability bilateral.  Intrinsic muscluature intact bilateral.  Rectus appearance of foot and digits noted bilateral.  ULCER   examination of the left hallux does reveal an open skin lesion at the distal aspect of the toe.  There is redness and swelling noted dorsally to the ulcer of the left great toe.  Necrotic tissue is noted at the site of the ulcer.  There is drainage noted in the center of the diabetic ulcer.  There is necrotic tissue noted in the nailbed of the second toe of the left foot  . Examination of the third toe does reveal a crusted healing ulcer on the dorsum of the third toe.  No drainage is noted.  Red inflamed crusted skin noted distal to the knee of the left leg.     Diabetic ulcer left hallux, third toe    Examination of her diabetic ulcer left foot  necrotic tissue debrided  from the hallux left foot.  Neosporin dry sterile dressing was applied to this toe .   The second toe, revealing no evidence of any pus drainage or infection. The third toe has a crusted healing ulcer on the dorsum of the third toe.   Prescribe cephalexin 500 mg.  # 30.  Home soaks were given.   Patient was instructed to return to the office at his regularly scheduled visit with Dr. [name].  If this condition worsens, he should call the office for follow-up examination.   Gardiner Barefoot DPM

## 2017-07-07 ENCOUNTER — Telehealth: Payer: Self-pay | Admitting: *Deleted

## 2017-07-07 NOTE — Telephone Encounter (Signed)
[name] (receptionist) stated that patient called front desk and told her to have Dr. Mariea Clonts give him a call. When [name] tried to transfer him to Clinical he hung up.   I tried calling patient and LMOM to return call.

## 2017-07-14 ENCOUNTER — Telehealth: Payer: Self-pay

## 2017-07-14 ENCOUNTER — Other Ambulatory Visit: Payer: Self-pay | Admitting: Internal Medicine

## 2017-07-14 MED ORDER — OXYCODONE HCL 10 MG PO TABS: 10.0000 mg | ORAL_TABLET | Freq: Three times a day (TID) | ORAL | 0 refills | Status: DC | PRN

## 2017-07-14 NOTE — Telephone Encounter (Signed)
Noted.  He requested it.  Interesting.

## 2017-07-14 NOTE — Telephone Encounter (Signed)
[place] data base checked. 

## 2017-07-14 NOTE — Telephone Encounter (Signed)
Sreejesh with [facility] called to inform provider that patient has refused [facility] PT since 06/29/17.

## 2017-07-20 ENCOUNTER — Telehealth: Payer: Self-pay

## 2017-07-20 DIAGNOSIS — I482 Chronic atrial fibrillation: Secondary | ICD-10-CM | POA: Diagnosis not present

## 2017-07-20 DIAGNOSIS — Z44111 Encounter for fitting and adjustment of complete right artificial leg: Secondary | ICD-10-CM | POA: Diagnosis not present

## 2017-07-20 DIAGNOSIS — E1151 Type 2 diabetes mellitus with diabetic peripheral angiopathy without gangrene: Secondary | ICD-10-CM | POA: Diagnosis not present

## 2017-07-20 DIAGNOSIS — I110 Hypertensive heart disease with heart failure: Secondary | ICD-10-CM | POA: Diagnosis not present

## 2017-07-20 DIAGNOSIS — I509 Heart failure, unspecified: Secondary | ICD-10-CM | POA: Diagnosis not present

## 2017-07-20 NOTE — Telephone Encounter (Signed)
[name] with [facility] called to inform Dr.Reed that patient was seen today and refused PT for next week, patient plans to resume the week 08/03/17  Dr.Reed verbally informed

## 2017-07-29 ENCOUNTER — Other Ambulatory Visit: Payer: Self-pay | Admitting: Internal Medicine

## 2017-08-03 ENCOUNTER — Other Ambulatory Visit: Payer: Self-pay | Admitting: Gastroenterology

## 2017-08-03 DIAGNOSIS — R1905 Periumbilic swelling, mass or lump: Secondary | ICD-10-CM

## 2017-08-03 NOTE — Telephone Encounter (Signed)
Call from therapist that pt is still refusing therapy, they will close patient out next week, if therapy is needed a new case will need to be open.

## 2017-08-04 NOTE — Telephone Encounter (Signed)
Noted.  Glad we have phone notes about it to track this.  He had requested the therapy at his appt.

## 2017-08-11 ENCOUNTER — Ambulatory Visit
Admission: RE | Admit: 2017-08-11 | Discharge: 2017-08-11 | Disposition: A | Discharge status: Home / Self Care | Payer: Medicare Other | Source: Ambulatory Visit | Attending: Gastroenterology | Admitting: Gastroenterology

## 2017-08-11 DIAGNOSIS — R1905 Periumbilic swelling, mass or lump: Secondary | ICD-10-CM

## 2017-08-11 DIAGNOSIS — R1900 Intra-abdominal and pelvic swelling, mass and lump, unspecified site: Secondary | ICD-10-CM | POA: Diagnosis not present

## 2017-08-15 ENCOUNTER — Other Ambulatory Visit: Payer: Self-pay

## 2017-08-15 MED ORDER — OXYCODONE HCL 10 MG PO TABS: 10.0000 mg | ORAL_TABLET | Freq: Three times a day (TID) | ORAL | 0 refills | Status: DC | PRN

## 2017-08-17 ENCOUNTER — Ambulatory Visit: Payer: Medicare Other | Admitting: Podiatry

## 2017-08-24 ENCOUNTER — Encounter: Payer: Self-pay | Admitting: Podiatry

## 2017-08-24 ENCOUNTER — Ambulatory Visit (INDEPENDENT_AMBULATORY_CARE_PROVIDER_SITE_OTHER): Payer: Medicare Other | Admitting: Podiatry

## 2017-08-24 DIAGNOSIS — E11621 Type 2 diabetes mellitus with foot ulcer: Secondary | ICD-10-CM

## 2017-08-24 DIAGNOSIS — L97522 Non-pressure chronic ulcer of other part of left foot with fat layer exposed: Secondary | ICD-10-CM | POA: Diagnosis not present

## 2017-08-24 DIAGNOSIS — I739 Peripheral vascular disease, unspecified: Secondary | ICD-10-CM

## 2017-08-24 NOTE — Progress Notes (Signed)
Subjective:    Patient ID: [name], male   DOB: 82 y.o.   MRN: [id number]   HPI patient states I'm doing pretty good with chronic ulceration    ROS      Objective:  Physical Exam neurovascular status chronic ulcer left hallux which has a significant amount of crusted tissue with a area opening proximally 7 mm in width by 5 mm in length by 2 mm in depth with no bone exposure. Patient's right leg was amputated due to infection and this has been chronic     Assessment:   Chronic ulceration left stable     Plan:   Sterile sharp debridement accomplished flushed the area packed with Iodosorb continue home care reappoint earlier if any redness drainage were to occur if not we'll have regular appointment in 6 weeks

## 2017-10-05 ENCOUNTER — Ambulatory Visit: Payer: Medicare Other | Admitting: Internal Medicine

## 2017-11-29 ENCOUNTER — Ambulatory Visit (INDEPENDENT_AMBULATORY_CARE_PROVIDER_SITE_OTHER): Payer: Medicare Other | Admitting: *Deleted

## 2017-11-29 DIAGNOSIS — I495 Sick sinus syndrome: Secondary | ICD-10-CM

## 2017-11-29 LAB — CUP PACEART INCLINIC DEVICE CHECK
Battery Voltage: 2.95 V
Brady Statistic RV Percent Paced: 11 %
Implantable Lead Implant Date: 20120817
Implantable Lead Location: 753860
Implantable Lead Model: 1948
Implantable Pulse Generator Implant Date: 20120817
Lead Channel Pacing Threshold Amplitude: 0.75 V
Lead Channel Pacing Threshold Amplitude: 0.75 V
Lead Channel Pacing Threshold Pulse Width: 0.4 ms
Lead Channel Pacing Threshold Pulse Width: 0.4 ms
Lead Channel Setting Pacing Amplitude: 2 V
Lead Channel Setting Pacing Pulse Width: 0.4 ms
MDC IDC MSMT BATTERY REMAINING LONGEVITY: 126 mo
MDC IDC MSMT LEADCHNL RV IMPEDANCE VALUE: 525 Ohm
MDC IDC MSMT LEADCHNL RV SENSING INTR AMPL: 10.3 mV
MDC IDC SESS DTM: 20190109165112
MDC IDC SET LEADCHNL RV SENSING SENSITIVITY: 2 mV
Pulse Gen Serial Number: 7244744

## 2017-11-29 NOTE — Progress Notes (Signed)
Pacemaker check in clinic. Normal device function. Thresholds, sensing, impedances consistent with previous measurements. Device programmed to maximize longevity. 1 VHR no EGM available. Device programmed at appropriate safety margins. Histogram distribution appropriate for patient activity level. Device programmed to optimize intrinsic conduction. Estimated longevity 10.5 years. ROV w/ SK 6 months. Patient education completed.

## 2017-11-30 ENCOUNTER — Ambulatory Visit: Payer: Medicare Other | Admitting: Podiatry

## 2017-12-07 ENCOUNTER — Ambulatory Visit: Payer: Medicare Other | Admitting: Podiatry

## 2017-12-14 ENCOUNTER — Encounter: Payer: Self-pay | Admitting: Podiatry

## 2017-12-14 ENCOUNTER — Ambulatory Visit: Payer: Medicare Other | Admitting: Podiatry

## 2017-12-14 DIAGNOSIS — L97522 Non-pressure chronic ulcer of other part of left foot with fat layer exposed: Secondary | ICD-10-CM

## 2017-12-14 DIAGNOSIS — E11621 Type 2 diabetes mellitus with foot ulcer: Secondary | ICD-10-CM | POA: Diagnosis not present

## 2017-12-14 DIAGNOSIS — I739 Peripheral vascular disease, unspecified: Secondary | ICD-10-CM

## 2017-12-14 NOTE — Progress Notes (Signed)
Subjective:   Patient ID: [name], male   DOB: 83 y.o.   MRN: [id number]   HPI Patient states doing pretty well   ROS      Objective:  Physical Exam  Neurovascular status intact with chronic breakdown of tissue dorsum left hallux localized in nature and localized to skin measuring about 1.2 cm x 8 mm with 2 mm of depth with no proximal edema erythema or drainage noted     Assessment:  Superficial ulceration that we have been able to keep under control     Plan:  Sterile debridement of lesion accomplished flush and applied sterile dressing to the area and instructed on continued home usage and soaks and patient will be seen back in 3 months or earlier if needed

## 2017-12-21 ENCOUNTER — Other Ambulatory Visit: Payer: Self-pay | Admitting: Internal Medicine

## 2018-02-06 ENCOUNTER — Telehealth (INDEPENDENT_AMBULATORY_CARE_PROVIDER_SITE_OTHER): Payer: Self-pay | Admitting: Orthopedic Surgery

## 2018-02-06 NOTE — Telephone Encounter (Signed)
Patient called specifically asking to speak with you about his shoulder injury. If you could give him a call back at [phone]

## 2018-02-07 NOTE — Telephone Encounter (Signed)
Called and advised pt's wife to have the pt call back with details about his problem and we can call back with answers. ( pt was out at the moment)

## 2018-02-18 ENCOUNTER — Other Ambulatory Visit: Payer: Self-pay | Admitting: Internal Medicine

## 2018-02-19 ENCOUNTER — Other Ambulatory Visit: Payer: Self-pay | Admitting: Internal Medicine

## 2018-02-20 ENCOUNTER — Other Ambulatory Visit: Payer: Self-pay | Admitting: Student

## 2018-02-20 ENCOUNTER — Other Ambulatory Visit: Payer: Self-pay | Admitting: Internal Medicine

## 2018-02-20 DIAGNOSIS — Z9884 Bariatric surgery status: Secondary | ICD-10-CM

## 2018-03-01 ENCOUNTER — Ambulatory Visit (INDEPENDENT_AMBULATORY_CARE_PROVIDER_SITE_OTHER): Payer: Medicare Other | Admitting: Orthopedic Surgery

## 2018-03-15 ENCOUNTER — Ambulatory Visit: Payer: Medicare Other | Admitting: Podiatry

## 2018-03-15 ENCOUNTER — Ambulatory Visit (INDEPENDENT_AMBULATORY_CARE_PROVIDER_SITE_OTHER): Payer: Medicare Other | Admitting: Orthopedic Surgery

## 2018-03-26 ENCOUNTER — Ambulatory Visit: Payer: Medicare Other | Admitting: Podiatry

## 2018-05-29 ENCOUNTER — Other Ambulatory Visit: Payer: Medicare Other

## 2018-06-04 ENCOUNTER — Other Ambulatory Visit: Payer: Self-pay | Admitting: Internal Medicine

## 2018-06-04 DIAGNOSIS — I739 Peripheral vascular disease, unspecified: Secondary | ICD-10-CM

## 2018-06-07 ENCOUNTER — Other Ambulatory Visit: Payer: Medicare Other

## 2018-06-18 ENCOUNTER — Other Ambulatory Visit: Payer: Medicare Other

## 2018-06-18 ENCOUNTER — Inpatient Hospital Stay: Admission: RE | Admit: 2018-06-18 | Payer: Medicare Other | Source: Ambulatory Visit

## 2018-07-10 ENCOUNTER — Encounter: Payer: Self-pay | Admitting: Internal Medicine

## 2018-07-10 ENCOUNTER — Ambulatory Visit: Payer: Medicare Other | Admitting: Internal Medicine

## 2018-07-10 ENCOUNTER — Encounter (INDEPENDENT_AMBULATORY_CARE_PROVIDER_SITE_OTHER): Payer: Self-pay

## 2018-07-10 VITALS — BP 122/60 | HR 74 | Ht 70.0 in | Wt 180.6 lb

## 2018-07-10 DIAGNOSIS — I10 Essential (primary) hypertension: Secondary | ICD-10-CM

## 2018-07-10 DIAGNOSIS — I482 Chronic atrial fibrillation, unspecified: Secondary | ICD-10-CM

## 2018-07-10 NOTE — Patient Instructions (Addendum)
Medication Instructions:  Your physician has recommended you make the following change in your medication:   Begin taking your Eliquis, once in the morning, once in the evening. Preferably with meals.   Labwork: You will have labs drawn today: CBC and BMP  Testing/Procedures: None ordered.  Follow-Up: Your physician wants you to follow-up in: One Year with Dr Caryl Comes. You will receive a reminder letter in the mail two months in advance. If you don't receive a letter, please call our office to schedule the follow-up appointment.  . Any Other Special Instructions Will Be Listed Below (If Applicable).     If you need a refill on your cardiac medications before your next appointment, please call your pharmacy.

## 2018-07-10 NOTE — Progress Notes (Signed)
Patient Care Team: Seward Carol, MD as PCP - General (Internal Medicine) Ricard Dillon, MD as Consulting Physician (Internal Medicine) Deboraha Sprang, MD as Consulting Physician (Cardiology) Thompson Grayer, MD as Consulting Physician (Cardiology) [name], MD as Consulting Physician (Orthopedic Surgery) Wallene Huh, DPM as Consulting Physician (Podiatry)   HPI  [name] is a 83 y.o. male Seen in followup for pacemaker implanted 2012   He has atrial fibrillation and had a stroke/TIA 22,014  4/15 Echo EF normal  He is s./p  AKA 2015   Date Cr Hgb  5/18 1.61 10.1     The patient denies chest pain, shortness of breath, nocturnal dyspnea, orthopnea or peripheral edema.  There have been no palpitations, lightheadedness or syncope.    Thromboembolic risk factors ( age -13, HTN-1, DM-1, TIA/CVA-2,) for a CHADSVASc Score of 6     Past Medical History:  Diagnosis Date  . Atrial fibrillation ([place])    permanent  . Backache, unspecified   . Cellulitis and abscess of leg, except foot   . CHF (congestive heart failure) ([place])   . Diverticulosis 05-2006   Dr. [name]   . Esophageal stricture   . Fatigue   . GERD (gastroesophageal reflux disease)   . Gout, unspecified   . Hx of colonic polyps 05-2006   (Adenomatous)Dr. [name]  . Hx of radiation therapy 06/16/14- 07/18/14   mult squamous cell carcinomas of scalp  . Hypertension   . Long term (current) use of anticoagulants   . Overweight(278.02)   . Pacemaker- [place] 02/20/2013  . Phlebitis and thrombophlebitis of superficial vessels of lower extremities   . Right bundle branch block   . Skin cancer    squamous cell carcinoma scalp  . Syncope and collapse   . Tachycardia-bradycardia ([place])    s/p PPM  . Type II or unspecified type diabetes mellitus without mention of complication, not stated as uncontrolled   . Unspecified venous (peripheral) insufficiency     Past Surgical History:  Procedure Laterality  Date  . ABOVE KNEE LEG AMPUTATION Right 04/02/2014   DR DUDA  . AMPUTATION Right 01/20/2014   Procedure: AMPUTATION BELOW KNEE- right;  Surgeon: [name], MD;  Location: [place];  Service: Orthopedics;  Laterality: Right;  Right Below Knee Amputation  . AMPUTATION Right 02/14/2014   Procedure: AMPUTATION BELOW KNEE;  Surgeon: [name], MD;  Location: [place];  Service: Orthopedics;  Laterality: Right;  Right Below Knee Amputation Revision  . AMPUTATION Right 03/07/2014   Procedure: AMPUTATION BELOW KNEE;  Surgeon: [name], MD;  Location: [place];  Service: Orthopedics;  Laterality: Right;  Right Below Knee Amputation Revision  . AMPUTATION Right 03/18/2014   Procedure: revision of  AMPUTATION BELOW KNEE;  Surgeon: [name], MD;  Location: [place];  Service: Orthopedics;  Laterality: Right;  Revision Right Below Knee Amputation  . AMPUTATION Right 04/02/2014   Procedure: AMPUTATION ABOVE KNEE;  Surgeon: [name], MD;  Location: [place];  Service: Orthopedics;  Laterality: Right;  Right Above Knee Amputation  . EYE SURGERY    . I&D EXTREMITY Right 01/17/2014   Procedure: IRRIGATION AND DEBRIDEMENT RIGHT HEEL  WITH CULTURES AND BONE BIOPSY, placement of wound vac;  Surgeon: Theodoro Kos, DO;  Location: [place];  Service: Plastics;  Laterality: Right;  . LAMINECTOMY    . LUMBAR FUSION    . PACEMAKER INSERTION  8/12   SJM by [name] for  tachy/brady syndrome  . ROTATOR CUFF REPAIR    . ruptured rt rectus muscle    . SKIN BIOPSY     scalp    Current Outpatient Medications  Medication Sig Dispense Refill  . ACCU-CHEK SOFTCLIX LANCETS lancets Use to check blood sugar three times daily. Dx E11.9 300 each 3  . apixaban (ELIQUIS) 5 MG TABS tablet Take 5 mg by mouth daily.    . B-D ULTRAFINE III SHORT PEN 31G X 8 MM MISC USE AS DIRECTED 100 each 11  . Blood Glucose Calibration (ACCU-CHEK AVIVA) SOLN Use as Directed. Dx E11.9 3 each 3  . diltiazem (TIAZAC) 180 MG 24 hr capsule Take 180 mg  by mouth daily.    . furosemide (LASIX) 20 MG tablet Take 20 mg by mouth every other day.    . gabapentin (NEURONTIN) 100 MG capsule Take 100 mg by mouth 2 (two) times daily.    Marland Kitchen glucose blood (ACCU-CHEK AVIVA PLUS) test strip DX E11.9, check blood sugar three times daily 300 each 11  . Insulin Glargine (LANTUS SOLOSTAR [place]) Inject 12 Units into the skin daily.    . insulin lispro (HUMALOG KWIKPEN) 100 UNIT/ML KiwkPen Inject into the skin.    Marland Kitchen omeprazole (PRILOSEC) 20 MG capsule Take 20 mg by mouth daily.    Marland Kitchen OVER THE COUNTER MEDICATION Take 1 Dose by mouth daily as needed.    . Oxycodone HCl 10 MG TABS Take 1 tablet (10 mg total) by mouth every 8 (eight) hours as needed. For pain 90 tablet 0   No current facility-administered medications for this visit.     Allergies  Allergen Reactions  . Morphine Rash and Other (See Comments)    Irritability also    Review of Systems negative except from HPI and PMH  Physical Exam BP 122/60   Pulse 74   Ht 5\' 10"  (1.778 m)   Wt 180 lb 9.6 oz (81.9 kg)   SpO2 99%   BMI 25.91 kg/m   Well developed and nourished in no acute distress HENT normal Neck supple with JVP-flat Clear Device pocket well healed; without hematoma or erythema.  There is no tethering  Irregularly irregular rhythm, no murmurs or gallops Abd-soft with active BS No Clubbing cyanosis edema s/p R BKA Skin-warm and dry A & Oriented  Grossly normal sensory and motor function   ECG . Afib @ 74 -/14/42 RBBB IMI   Assessment and  Plan  Atrial fib-permanent  Renal insufficiency grade 3    Pacemaker  The patient's device was interrogated.  The information was reviewed. No changes were made in the programming.     On Anticoagulation;  No bleeding issues   Need to check blood work  Had a lengthy discussion Regarding anticoagulation And the lack of data on effectiveness for once daily apixaban.  I have asked him to look into the cost of rivaroxaban as an alternative.   He will let us know.  He then went on to tell me the story of how his medications are paid for by a charitable fund created [employer]  We spent more than 50% of our >25 min visit in face to face counseling regarding the above

## 2018-07-11 LAB — CBC
Hematocrit: 37 % — ABNORMAL LOW (ref 37.5–51.0)
Hemoglobin: 12.1 g/dL — ABNORMAL LOW (ref 13.0–17.7)
MCH: 27.1 pg (ref 26.6–33.0)
MCHC: 32.7 g/dL (ref 31.5–35.7)
MCV: 83 fL (ref 79–97)
PLATELETS: 208 10*3/uL (ref 150–450)
RBC: 4.47 x10E6/uL (ref 4.14–5.80)
RDW: 15 % (ref 12.3–15.4)
WBC: 7.7 10*3/uL (ref 3.4–10.8)

## 2018-07-11 LAB — CUP PACEART INCLINIC DEVICE CHECK
Battery Remaining Longevity: 126 mo
Battery Voltage: 2.95 V
Brady Statistic RV Percent Paced: 12 %
Date Time Interrogation Session: 20190820194623
Implantable Lead Implant Date: 20120817
Implantable Lead Location: 753860
Implantable Lead Model: 1948
Implantable Pulse Generator Implant Date: 20120817
Lead Channel Impedance Value: 525 Ohm
Lead Channel Pacing Threshold Amplitude: 0.75 V
Lead Channel Pacing Threshold Amplitude: 0.75 V
Lead Channel Pacing Threshold Pulse Width: 0.4 ms
Lead Channel Pacing Threshold Pulse Width: 0.4 ms
Lead Channel Sensing Intrinsic Amplitude: 10.5 mV
Lead Channel Setting Pacing Amplitude: 2 V
Lead Channel Setting Pacing Pulse Width: 0.4 ms
Lead Channel Setting Sensing Sensitivity: 2 mV
Pulse Gen Model: 1210
Pulse Gen Serial Number: 7244744

## 2018-07-11 LAB — BASIC METABOLIC PANEL
BUN/Creatinine Ratio: 11 (ref 10–24)
BUN: 16 mg/dL (ref 8–27)
CALCIUM: 9 mg/dL (ref 8.6–10.2)
CHLORIDE: 100 mmol/L (ref 96–106)
CO2: 22 mmol/L (ref 20–29)
Creatinine, Ser: 1.47 mg/dL — ABNORMAL HIGH (ref 0.76–1.27)
GFR calc Af Amer: 51 mL/min/{1.73_m2} — ABNORMAL LOW (ref >59)
GFR calc non Af Amer: 44 mL/min/{1.73_m2} — ABNORMAL LOW (ref >59)
Glucose: 176 mg/dL — ABNORMAL HIGH (ref 65–99)
POTASSIUM: 3.9 mmol/L (ref 3.5–5.2)
Sodium: 141 mmol/L (ref 134–144)

## 2018-07-13 ENCOUNTER — Telehealth: Payer: Self-pay | Admitting: Internal Medicine

## 2018-07-13 NOTE — Telephone Encounter (Signed)
Per pt wanted to let Dr Caryl Comes know the price difference between Eliquis and Xarelto was only a $1.00 Per pt will stay on the Eliquis./c

## 2018-07-13 NOTE — Telephone Encounter (Signed)
New Message        Patient called to give a report, did not want to give to me wanted to give to Dr. Caryl Comes or his nurse. Pls advise.

## 2018-07-19 ENCOUNTER — Encounter: Payer: Self-pay | Admitting: Podiatry

## 2018-07-19 ENCOUNTER — Ambulatory Visit: Payer: Medicare Other | Admitting: Podiatry

## 2018-07-19 DIAGNOSIS — M79676 Pain in unspecified toe(s): Secondary | ICD-10-CM

## 2018-07-19 DIAGNOSIS — B351 Tinea unguium: Secondary | ICD-10-CM

## 2018-07-19 DIAGNOSIS — E11621 Type 2 diabetes mellitus with foot ulcer: Secondary | ICD-10-CM | POA: Diagnosis not present

## 2018-07-19 DIAGNOSIS — L97522 Non-pressure chronic ulcer of other part of left foot with fat layer exposed: Secondary | ICD-10-CM

## 2018-07-19 NOTE — Progress Notes (Signed)
Subjective:   Patient ID: [name], male   DOB: 83 y.o.   MRN: [id number]   HPI Patient presents stating my nails on my left foot are long and I traumatized my fifth nail and I still have the ulcer my left big toe which is somewhat improved   ROS      Objective:  Physical Exam  Neurovascular status intact with patient found to have nail disease of the lesser nails left and keratotic lesion with slight distal drainage of the left hallux and left plantar lateral heel that is localized     Assessment:  Poor health individual is lost his right leg with long-term diabetes with risk nail disease and lesions     Plan:  Discussed ulceration in this point is relatively stable he will continue soaks at home bandaging and I debrided nailbeds on the left and applied sterile dressing to the left fifth digit and patient will be seen back as needed

## 2018-08-17 ENCOUNTER — Telehealth: Payer: Self-pay | Admitting: Internal Medicine

## 2018-08-17 NOTE — Telephone Encounter (Signed)
Pt calls today to ask when he should begin Eliquis after his tooth extractions on Monday. Pt states Dr Caryl Comes advised him to stop 3 doses directly prior to his procedure. I told pt I did not see this noted in his last OV, so I could not advise him on it, but I believed he should begin back on his Eliquis asap after his procedure. However, these are typical medication instructions for Dr Caryl Comes prior to procedures. Pt has verbalized understanding and had no additional questions.

## 2018-08-17 NOTE — Telephone Encounter (Signed)
New Message          Patient is having oral surgery next week (Monday) and he wants to know how long he should stay off of Eliquis 5.mg? Pls call and advise

## 2018-08-21 ENCOUNTER — Other Ambulatory Visit: Payer: Medicare Other

## 2018-08-24 ENCOUNTER — Ambulatory Visit
Admission: RE | Admit: 2018-08-24 | Discharge: 2018-08-24 | Disposition: A | Discharge status: Home / Self Care | Payer: Medicare Other | Source: Ambulatory Visit | Attending: Internal Medicine | Admitting: Internal Medicine

## 2018-08-24 DIAGNOSIS — I739 Peripheral vascular disease, unspecified: Secondary | ICD-10-CM

## 2018-09-12 ENCOUNTER — Other Ambulatory Visit: Payer: Self-pay | Admitting: Student

## 2018-09-12 DIAGNOSIS — R1905 Periumbilic swelling, mass or lump: Secondary | ICD-10-CM

## 2018-09-20 ENCOUNTER — Ambulatory Visit
Admission: RE | Admit: 2018-09-20 | Discharge: 2018-09-20 | Disposition: A | Discharge status: Home / Self Care | Payer: Medicare Other | Source: Ambulatory Visit | Attending: Student | Admitting: Student

## 2018-09-20 DIAGNOSIS — R1905 Periumbilic swelling, mass or lump: Secondary | ICD-10-CM

## 2018-09-20 MED ORDER — IOPAMIDOL (ISOVUE-300) INJECTION 61%
100.0000 mL | Freq: Once | INTRAVENOUS | Status: AC | PRN
  Administered 2018-09-20: 80 mL via INTRAVENOUS

## 2018-11-03 ENCOUNTER — Encounter (HOSPITAL_COMMUNITY): Payer: Self-pay | Admitting: Emergency Medicine

## 2018-11-03 ENCOUNTER — Ambulatory Visit (HOSPITAL_COMMUNITY)
Admission: EM | Admit: 2018-11-03 | Discharge: 2018-11-03 | Disposition: A | Discharge status: Home / Self Care | Payer: Medicare Other | Attending: Family Medicine | Admitting: Family Medicine

## 2018-11-03 DIAGNOSIS — M10041 Idiopathic gout, right hand: Secondary | ICD-10-CM

## 2018-11-03 DIAGNOSIS — L089 Local infection of the skin and subcutaneous tissue, unspecified: Secondary | ICD-10-CM

## 2018-11-03 MED ORDER — PREDNISONE 10 MG PO TABS: 50.0000 mg | ORAL_TABLET | Freq: Every day | ORAL | 0 refills | Status: AC

## 2018-11-03 MED ORDER — CEPHALEXIN 500 MG PO CAPS: 500.0000 mg | ORAL_CAPSULE | Freq: Three times a day (TID) | ORAL | 0 refills | Status: DC

## 2018-11-03 NOTE — ED Provider Notes (Signed)
[place]    CSN: [id number] Arrival date & time: 11/03/18  1515     History   Chief Complaint Chief Complaint  Patient presents with  . Hand Pain    HPI [name] is a 83 y.o. male.   Medical history reviewed.  HPI  Patient presents with 4-5 days of gradually worsening right hand pain, swelling and redness. He has been treated for a gout flare in the past. Right hand is swollen and tenderness most prominently in DIP and PIP joints. There is swelling of the right wrist with stiffness. He is concern for skin infection as the redness of the hand has worsen. He suffers from diabetes and has hx of amputation.    Past Medical History:  Diagnosis Date  . Atrial fibrillation ([place])    permanent  . Backache, unspecified   . Cellulitis and abscess of leg, except foot   . CHF (congestive heart failure) ([place])   . Diverticulosis 05-2006   Dr. [name]   . Esophageal stricture   . Fatigue   . GERD (gastroesophageal reflux disease)   . Gout, unspecified   . Hx of colonic polyps 05-2006   (Adenomatous)Dr. [name]  . Hx of radiation therapy 06/16/14- 07/18/14   mult squamous cell carcinomas of scalp  . Hypertension   . Long term (current) use of anticoagulants   . Overweight(278.02)   . Pacemaker- [place] 02/20/2013  . Phlebitis and thrombophlebitis of superficial vessels of lower extremities   . Right bundle branch block   . Skin cancer    squamous cell carcinoma scalp  . Syncope and collapse   . 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 Type II or unspecified type diabetes mellitus without mention of complication, not stated as uncontrolled   . Unspecified venous (peripheral) insufficiency     Patient Active Problem List   Diagnosis Date Noted  . Sepsis ([place]) 03/23/2017  . CKD (chronic kidney disease), stage III ([place]) 03/23/2017  . Leukocytosis   . Cellulitis 03/22/2017  . Diabetic foot ulcer associated with type 2 diabetes mellitus ([place]) 11/24/2014  .  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 Unilateral AKA, right ([place]) 04/08/2014  . PAD (peripheral artery disease) ([place]) 04/08/2014  . Gangrene associated with type 2 diabetes mellitus ([place]) 04/02/2014  . Generalized weakness 03/23/2014  . Wound dehiscence 03/15/2014  . Coagulopathy ([place]) 03/09/2014  . Anemia due to acute blood loss 03/08/2014  . Diabetes mellitus ([place]) 03/02/2014  . Anemia 03/01/2014  . Constipation 02/18/2014  . Insomnia 02/18/2014  . Amputation stump infection ([place]) 02/06/2014  . Hx of right BKA ([place]) 01/28/2014  . Osteomyelitis ([place]) 01/16/2014  . Diabetic osteomyelitis ([place]) 01/09/2014  . Pacemaker- [name] 02/20/2013  . Hyperlipidemia 12/12/2012  . TIA (transient ischemic attack) 12/11/2012  . ETOH abuse 04/02/2012  . ACTINIC KERATOSIS 11/29/2010  . MULTIPLE&UNSPEC OPEN WOUND LOWER LIMB COMP 10/19/2010  . ADHESIVE CAPSULITIS OF SHOULDER 08/30/2010  . ESOPHAGEAL STRICTURE 05/19/2010  . DYSPHAGIA UNSPECIFIED 04/08/2010  . DUPUYTREN'S CONTRACTURE, LEFT 12/21/2009  . CERVICAL STRAIN, ACUTE 08/28/2009  . OVERWEIGHT/OBESITY 04/07/2009  . RBBB 04/07/2009  . GERD 04/07/2009  . SYNCOPE AND COLLAPSE 04/07/2009  . FATIGUE / MALAISE 04/07/2009  . PHLEBITIS&THROMBOPHLEB SUP VESSELS LOWER EXTREM 02/28/2008  . CELLULITIS/ABSCESS, LEG 02/28/2008  . BACK PAIN, CHRONIC 10/31/2007  . Unspecified venous (peripheral) insufficiency 07/02/2007  . GOUT 06/26/2007  . Essential hypertension 06/26/2007  . Atrial fibrillation ([place]) 06/26/2007  .  Chronic diastolic CHF (congestive heart failure) ([place]) 06/26/2007    Past Surgical History:  Procedure Laterality Date  . ABOVE KNEE LEG AMPUTATION Right 04/02/2014   DR DUDA  . AMPUTATION Right 01/20/2014   Procedure: AMPUTATION BELOW KNEE- right;  Surgeon: [name], MD;  Location: [facility];  Service: Orthopedics;  Laterality: Right;  Right Below Knee Amputation  . AMPUTATION Right 02/14/2014   Procedure:  AMPUTATION BELOW KNEE;  Surgeon: [name], MD;  Location: [facility];  Service: Orthopedics;  Laterality: Right;  Right Below Knee Amputation Revision  . AMPUTATION Right 03/07/2014   Procedure: AMPUTATION BELOW KNEE;  Surgeon: [name], MD;  Location: [facility];  Service: Orthopedics;  Laterality: Right;  Right Below Knee Amputation Revision  . AMPUTATION Right 03/18/2014   Procedure: revision of  AMPUTATION BELOW KNEE;  Surgeon: [name], MD;  Location: [facility];  Service: Orthopedics;  Laterality: Right;  Revision Right Below Knee Amputation  . AMPUTATION Right 04/02/2014   Procedure: AMPUTATION ABOVE KNEE;  Surgeon: [name], MD;  Location: [facility];  Service: Orthopedics;  Laterality: Right;  Right Above Knee Amputation  . EYE SURGERY    . I&D EXTREMITY Right 01/17/2014   Procedure: IRRIGATION AND DEBRIDEMENT RIGHT HEEL  WITH CULTURES AND BONE BIOPSY, placement of wound vac;  Surgeon: Theodoro Kos, DO;  Location: [facility];  Service: Plastics;  Laterality: Right;  . LAMINECTOMY    . LUMBAR FUSION    . PACEMAKER INSERTION  8/12   SJM by [name] for tachy/brady syndrome  . ROTATOR CUFF REPAIR    . ruptured rt rectus muscle    . SKIN BIOPSY     scalp       Home Medications    Prior to Admission medications   Medication Sig Start Date End Date Taking? Authorizing Provider  ACCU-CHEK SOFTCLIX LANCETS lancets Use to check blood sugar three times daily. Dx E11.9 04/26/17   Mariea Clonts, Tiffany L, DO  apixaban (ELIQUIS) 5 MG TABS tablet Take 5 mg by mouth 2 (two) times daily.     [provider]  B-D ULTRAFINE III SHORT PEN 31G X 8 MM MISC USE AS DIRECTED 02/20/18   Reed, Tiffany L, DO  Blood Glucose Calibration (ACCU-CHEK AVIVA) SOLN Use as Directed. Dx E11.9 05/01/17   Reed, Tiffany L, DO  diltiazem (TIAZAC) 180 MG 24 hr capsule Take 180 mg by mouth daily.    [provider]  furosemide (LASIX) 20 MG tablet Take 20 mg by mouth every other day.    [provider]    gabapentin (NEURONTIN) 100 MG capsule Take 100 mg by mouth 2 (two) times daily.    [provider]  glucose blood (ACCU-CHEK AVIVA PLUS) test strip DX E11.9, check blood sugar three times daily 04/18/17   Reed, Tiffany L, DO  Insulin Glargine (LANTUS SOLOSTAR ) Inject 12 Units into the skin daily.    [provider]  insulin lispro (HUMALOG KWIKPEN) 100 UNIT/ML KiwkPen Inject into the skin.    [provider]  Multiple Vitamins-Minerals (MULTIVITAMIN PO) Take 1 tablet by mouth daily. Takes a Men's 50 Plus Multivitamin    [provider]  omeprazole (PRILOSEC) 20 MG capsule Take 20 mg by mouth daily.    [provider]  OVER THE COUNTER MEDICATION Take 1 Dose by mouth daily as needed.    [provider]  Oxycodone HCl 10 MG TABS Take 1 tablet (10 mg total) by  mouth every 8 (eight) hours as needed. For pain 08/15/17   Gildardo Cranker, DO    Family History Family History  Problem Relation Age of Onset  . Pneumonia Mother   . Heart disease Father   . Diabetes Father   . Colon cancer Neg Hx     Social History Social History   Tobacco Use  . Smoking status: Former Research scientist (life sciences)  . Smokeless tobacco: Never Used  . Tobacco comment: Quit at age 47   Substance Use Topics  . Alcohol use: No    Alcohol/week: 0.0 standard drinks    Comment: none currenlty  . Drug use: No     Allergies   Morphine   Review of Systems Review of Systems Pertinent negatives listed in HPI  Physical Exam Triage Vital Signs ED Triage Vitals  Enc Vitals Group     BP 11/03/18 1609 (!) 122/53     Pulse Rate 11/03/18 1609 78     Resp 11/03/18 1609 16     Temp 11/03/18 1609 98.8 F (37.1 C)     Temp src --      SpO2 11/03/18 1609 100 %     Weight --      Height --      Head Circumference --      Peak Flow --      Pain Score 11/03/18 1611 4     Pain Loc --      Pain Edu? --      Excl. in [place]? --    No data found.  Updated Vital Signs BP (!) 122/53    Pulse 78   Temp 98.8 F (37.1 C)   Resp 16   SpO2 100%   Visual Acuity Right Eye Distance:   Left Eye Distance:   Bilateral Distance:    Right Eye Near:   Left Eye Near:    Bilateral Near:     Physical Exam General appearance: alert, well developed, well nourished, cooperative and in no distress Head: Normocephalic, without obvious abnormality, atraumatic Respiratory: Respirations even and unlabored, normal respiratory rate Heart: rate and rhythm normal. No murmurs or gallops noted Extremities: No gross deformities Right Hand: Generalized edema of hand and erythema. DIP and PIP joint tenderness. Tenderness along right wrist. Psych: Appropriate mood and affect. Neurologic: Mental status: Alert, oriented to person, place, and time, thought content appropriate.  UC Treatments / Results  Labs (all labs ordered are listed, but only abnormal results are displayed) Labs Reviewed - No data to display  EKG None  Radiology No results found.  Procedures Procedures (including critical care time)  Medications Ordered in UC Medications - No data to display  Initial Impression / Assessment and Plan / UC Course  I have reviewed the triage vital signs and the nursing notes.  Pertinent labs & imaging results that were available during my care of the patient were reviewed by me and considered in my medical decision making (see chart for details).   Patient presents today with exam findings consistent with a gout attack involving the right hand and wrist. Patient suffers from diabetes which is well controlled at present therefore, will treat current attack with only a short course of prednisone. Will also cover for possible cellulitis infection with Keflex 500 mg TID x 7 days. Strict follow-up precautions advised. Patient verbalized understanding and agreement with plan. Final Clinical Impressions(s) / UC Diagnoses   Final diagnoses:  Acute idiopathic gout of right hand  Skin  infection  Discharge Instructions     Take all medication as prescribed. If no improvement, please follow-up here at with your Primary Care Provider.    ED Prescriptions    Medication Sig Dispense Auth. Provider   cephALEXin (KEFLEX) 500 MG capsule Take 1 capsule (500 mg total) by mouth 3 (three) times daily. 20 capsule Scot Jun, FNP   predniSONE (DELTASONE) 10 MG tablet Take 5 tablets (50 mg total) by mouth daily with breakfast for 5 days. 25 tablet Scot Jun, FNP     Controlled Substance Prescriptions [place] Controlled Substance Registry consulted? Not Applicable   Scot Jun, [place] 11/09/18 [phone]

## 2018-11-03 NOTE — ED Triage Notes (Signed)
Pt states he thinks he has cellulitis in his R hand, denies injury, states he noticed R hand swelling 4-5 days ago.

## 2018-11-03 NOTE — Discharge Instructions (Signed)
Take all medication as prescribed. If no improvement, please follow-up here at with your Primary Care Provider.

## 2018-11-27 ENCOUNTER — Other Ambulatory Visit: Payer: Self-pay | Admitting: Internal Medicine

## 2018-12-05 ENCOUNTER — Ambulatory Visit: Payer: Medicare Other | Admitting: Vascular Surgery

## 2018-12-05 ENCOUNTER — Encounter: Payer: Self-pay | Admitting: Vascular Surgery

## 2018-12-05 ENCOUNTER — Other Ambulatory Visit: Payer: Self-pay

## 2018-12-05 VITALS — BP 130/62 | HR 73 | Temp 99.0°F | Resp 16 | Ht 69.0 in | Wt 168.0 lb

## 2018-12-05 DIAGNOSIS — I739 Peripheral vascular disease, unspecified: Secondary | ICD-10-CM | POA: Diagnosis not present

## 2018-12-05 MED ORDER — ALLOPURINOL 100 MG PO TABS: 100.0000 mg | ORAL_TABLET | Freq: Every day | ORAL | 0 refills | Status: DC

## 2018-12-05 NOTE — Progress Notes (Signed)
Patient name: [name] MRN: [id number] DOB: 20-Dec-1934 Sex: male  REASON FOR VISIT:   Follow-up of peripheral vascular disease.  The consult is requested by Dr. Delfina Redwood.  HPI:   [name] is a pleasant 84 y.o. male who I last saw on 08/03/2016.  He has had a previous right above-the-knee amputation.  This was for a diabetic foot infection.  I saw him with a left second toe wound.  When I had originally seen him on 05/25/2016 he had a palpable femoral, popliteal, dorsalis pedis pulse in the left.  His ABI was 90%.  At that time, the wounds on his left first and second toes were gradually improving.  He was to continue follow-up with Dr. [name].  I reviewed the records from the referring office.  The patient was seen by Dr. Delfina Redwood on 10/11/2018.  The patient has stage III chronic kidney disease, congestive heart failure, peripheral vascular disease, type 2 diabetes, essential hypertension, and chronic pain syndrome.  As he had not been seen in follow-up recently he was sent for vascular follow-up.  He does not use his prosthesis for the right leg.  I do not get any history of claudication or rest pain.  He does spend a fair amount of time sitting up in his chair so he has moderate left leg swelling.  He denies any wounds except for a very small wound on the lateral aspect of his left foot which he just noticed last week.  He has had no drainage from this or redness associated with this.  He does not remember any specific injury to this.  The patient did have a noninvasive study done on 08/24/2018 which showed an ABI on the left of 72%.  Past Medical History:  Diagnosis Date  . Atrial fibrillation ([place])    permanent  . Backache, unspecified   . Cellulitis and abscess of leg, except foot   . CHF (congestive heart failure) ([place])   . Diverticulosis 05-2006   Dr. [name]   . Esophageal stricture   . Fatigue   . GERD (gastroesophageal reflux disease)   . Gout, unspecified   . Hx of  colonic polyps 05-2006   (Adenomatous)Dr. [name]  . Hx of radiation therapy 06/16/14- 07/18/14   mult squamous cell carcinomas of scalp  . Hypertension   . Long term (current) use of anticoagulants   . Overweight(278.02)   . Pacemaker- [place] 02/20/2013  . Phlebitis and thrombophlebitis of superficial vessels of lower extremities   . Right bundle branch block   . Skin cancer    squamous cell carcinoma scalp  . Syncope and collapse   . Tachycardia-bradycardia ([place])    s/p PPM  . Type II or unspecified type diabetes mellitus without mention of complication, not stated as uncontrolled   . Unspecified venous (peripheral) insufficiency     Family History  Problem Relation Age of Onset  . Pneumonia Mother   . Heart disease Father   . Diabetes Father   . Colon cancer Neg Hx     SOCIAL HISTORY: Social History   Tobacco Use  . Smoking status: Former Research scientist (life sciences)  . Smokeless tobacco: Never Used  . Tobacco comment: Quit at age 35   Substance Use Topics  . Alcohol use: No    Alcohol/week: 0.0 standard drinks    Comment: none currenlty    Allergies  Allergen Reactions  . Morphine Rash and Other (See Comments)    Irritability also  Current Outpatient Medications  Medication Sig Dispense Refill  . ACCU-CHEK SOFTCLIX LANCETS lancets Use to check blood sugar three times daily. Dx E11.9 300 each 3  . apixaban (ELIQUIS) 5 MG TABS tablet Take 5 mg by mouth 2 (two) times daily.     . B-D ULTRAFINE III SHORT PEN 31G X 8 MM MISC USE AS DIRECTED 100 each 11  . Blood Glucose Calibration (ACCU-CHEK AVIVA) SOLN Use as Directed. Dx E11.9 3 each 3  . cephALEXin (KEFLEX) 500 MG capsule Take 1 capsule (500 mg total) by mouth 3 (three) times daily. 20 capsule 0  . diltiazem (TIAZAC) 180 MG 24 hr capsule Take 180 mg by mouth daily.    . furosemide (LASIX) 20 MG tablet Take 20 mg by mouth every other day.    . gabapentin (NEURONTIN) 100 MG capsule Take 100 mg by mouth 2 (two) times daily.    Marland Kitchen  glucose blood (ACCU-CHEK AVIVA PLUS) test strip DX E11.9, check blood sugar three times daily 300 each 11  . Insulin Glargine (LANTUS SOLOSTAR [place]) Inject 12 Units into the skin daily.    . insulin lispro (HUMALOG KWIKPEN) 100 UNIT/ML KiwkPen Inject into the skin.    . Multiple Vitamins-Minerals (MULTIVITAMIN PO) Take 1 tablet by mouth daily. Takes a Men's 50 Plus Multivitamin    . omeprazole (PRILOSEC) 20 MG capsule Take 20 mg by mouth daily.    Marland Kitchen OVER THE COUNTER MEDICATION Take 1 Dose by mouth daily as needed.    . Oxycodone HCl 10 MG TABS Take 1 tablet (10 mg total) by mouth every 8 (eight) hours as needed. For pain 90 tablet 0  . allopurinol (ZYLOPRIM) 100 MG tablet Take 1 tablet (100 mg total) by mouth daily. 10 tablet 0   No current facility-administered medications for this visit.     REVIEW OF SYSTEMS:  [X]  denotes positive finding, [ ]  denotes negative finding Cardiac  Comments:  Chest pain or chest pressure:    Shortness of breath upon exertion:    Short of breath when lying flat:    Irregular heart rhythm:        Vascular    Pain in calf, thigh, or hip brought on by ambulation:    Pain in feet at night that wakes you up from your sleep:     Blood clot in your veins:    Leg swelling:  x       Pulmonary    Oxygen at home:    Productive cough:     Wheezing:         Neurologic    Sudden weakness in arms or legs:     Sudden numbness in arms or legs:     Sudden onset of difficulty speaking or slurred speech:    Temporary loss of vision in one eye:     Problems with dizziness:         Gastrointestinal    Blood in stool:     Vomited blood:         Genitourinary    Burning when urinating:     Blood in urine:        Psychiatric    Major depression:         Hematologic    Bleeding problems:    Problems with blood clotting too easily:        Skin    Rashes or ulcers:        Constitutional    Fever or chills:  PHYSICAL EXAM:   Vitals:   12/05/18 1507    BP: 130/62  Pulse: 73  Resp: 16  Temp: 99 F (37.2 C)  TempSrc: Oral  SpO2: 99%  Weight: 168 lb (76.2 kg)  Height: 5\' 9"  (1.753 m)   GENERAL: The patient is a well-nourished male, in no acute distress. The vital signs are documented above. CARDIAC: There is a regular rate and rhythm.  VASCULAR: I do not detect carotid bruits. He has palpable femoral pulses.  I cannot palpate a left popliteal or pedal pulses on the left.  He does have a monophasic dorsalis pedis and anterior tibial signal on the left.  I cannot get a posterior tibial signal. He has moderate left lower extremity swelling and some hyperpigmentation consistent with chronic venous insufficiency. PULMONARY: There is good air exchange bilaterally without wheezing or rales. ABDOMEN: Soft and non-tender with normal pitched bowel sounds.  MUSCULOSKELETAL: He has a right above-the-knee amputation. NEUROLOGIC: No focal weakness or paresthesias are detected. SKIN: There are no ulcers or rashes noted. PSYCHIATRIC: The patient has a normal affect.  DATA:    ARTERIAL DOPPLER STUDY: I have reviewed the arterial Doppler study that was done on 08/25/2018.  He had a monophasic posterior tibial and dorsalis pedis signal on the left with an ABI of 72%.  MEDICAL ISSUES:   PERIPHERAL VASCULAR DISEASE: This patient does have evidence of infrainguinal arterial occlusive disease on the left.  He is asymptomatic.  He has however developed a very small wound along the lateral aspect of his left foot which looks like a small pressure sore.  There is no erythema or drainage associated with this.  He just noticed this a few days ago.  I have offered to follow this wound however he feels comfortable that he will keep a very close eye on this and let us know if anything changes.  Therefore I have ordered follow-up ABIs in 6 months and I will see him back at that time.  Fortunately he is not a smoker.  He will call if there is any change in the wound on  the lateral aspect of his left foot.  Deitra Mayo [facility] [phone]

## 2019-01-03 ENCOUNTER — Ambulatory Visit: Payer: Medicare Other | Admitting: Podiatry

## 2019-01-03 ENCOUNTER — Encounter: Payer: Self-pay | Admitting: Podiatry

## 2019-01-03 DIAGNOSIS — L97522 Non-pressure chronic ulcer of other part of left foot with fat layer exposed: Secondary | ICD-10-CM | POA: Diagnosis not present

## 2019-01-03 DIAGNOSIS — M79676 Pain in unspecified toe(s): Secondary | ICD-10-CM

## 2019-01-03 DIAGNOSIS — B351 Tinea unguium: Secondary | ICD-10-CM | POA: Diagnosis not present

## 2019-01-03 NOTE — Progress Notes (Signed)
Subjective:   Patient ID: [name], male   DOB: 84 y.o.   MRN: [id number]   HPI Patient presents stating that he gets irritation on the outside of his left foot and would like to look at a new diabetic shoe and he gets nails that he cannot cut and has lost his right leg   ROS      Objective:  Physical Exam  Neurovascular status has been checked and found to be adequate and patient is noted on the outside of the left foot to have keratotic tissue formation with history of ulceration and breakdown of tissue of the hallux second digit with nail disease 345     Assessment:  Strong at risk person here with chronic ulceration keratotic lesion and nail disease     Plan:  Debridement of lesions debridement of nails and this will be done routinely and we will make him a new diabetic shoe for his left foot

## 2019-01-23 ENCOUNTER — Encounter: Payer: Self-pay | Admitting: Podiatry

## 2019-01-23 ENCOUNTER — Ambulatory Visit: Payer: Medicare Other | Admitting: Podiatry

## 2019-01-23 DIAGNOSIS — L97522 Non-pressure chronic ulcer of other part of left foot with fat layer exposed: Secondary | ICD-10-CM

## 2019-01-23 NOTE — Progress Notes (Signed)
Subjective:   Patient ID: [name], male   DOB: 84 y.o.   MRN: [id number]   HPI Patient presents with chronic ulceration lateral side left foot with concerns and has not yet received his diabetic shoes and is worried it may be infected   ROS      Objective:  Physical Exam  Patient has already lost the right lower leg and has severe circulatory issues left with chronic ulceration with keratotic tissue formation lateral side left and mild ulceration left hallux.     Assessment:  Significant at risk patient with lesion formation plantar lateral aspect left fifth metatarsal and also left hallux.  There is no redness or current drainage around the lesion on the outside of the foot     Plan:  Structural issues with patient having severe vascular disease long-term history of diabetes with lesion formation.  I discussed with him the risk associated with this we will continue padding usage take pressure off of it and diabetic shoe should be arriving soon.  Patient is encouraged to be careful and diligent and if any issues were to occur to let me know immediately signed visit

## 2019-03-14 ENCOUNTER — Telehealth: Payer: Self-pay | Admitting: Podiatry

## 2019-03-14 NOTE — Telephone Encounter (Signed)
Left message for pt to call to schedule an appt to pick up diabetic shoes/inserts.   Pt returned call and is aware they are in and is wanting to wait until the after the coronavirus pandemic is over to call to schedule an appt.

## 2019-04-07 ENCOUNTER — Other Ambulatory Visit: Payer: Self-pay | Admitting: Internal Medicine

## 2019-04-08 ENCOUNTER — Other Ambulatory Visit: Payer: Self-pay | Admitting: Internal Medicine

## 2019-06-06 ENCOUNTER — Other Ambulatory Visit: Payer: Self-pay

## 2019-06-06 DIAGNOSIS — I739 Peripheral vascular disease, unspecified: Secondary | ICD-10-CM

## 2019-06-11 ENCOUNTER — Telehealth (HOSPITAL_COMMUNITY): Payer: Self-pay

## 2019-06-11 NOTE — Telephone Encounter (Signed)
Left voicemail with Covid-19 information.

## 2019-06-12 ENCOUNTER — Inpatient Hospital Stay (HOSPITAL_COMMUNITY): Admission: RE | Admit: 2019-06-12 | Payer: Medicare Other | Source: Ambulatory Visit

## 2019-06-12 ENCOUNTER — Ambulatory Visit: Payer: Medicare Other | Admitting: Vascular Surgery

## 2019-06-26 ENCOUNTER — Ambulatory Visit: Payer: Medicare Other | Admitting: Podiatry

## 2019-06-27 ENCOUNTER — Ambulatory Visit: Payer: Medicare Other | Admitting: Podiatry

## 2019-08-01 ENCOUNTER — Ambulatory Visit: Payer: Medicare Other | Admitting: Orthotics

## 2019-08-01 ENCOUNTER — Ambulatory Visit: Payer: Medicare Other | Admitting: Podiatry

## 2019-09-11 ENCOUNTER — Telehealth: Payer: Self-pay | Admitting: Podiatry

## 2019-09-11 NOTE — Telephone Encounter (Signed)
Left message for pt the we received an updated authorization for his diabetic shoes that expires on 11/04/2019 and to call and make an appt to pick them up before then.

## 2019-11-29 ENCOUNTER — Other Ambulatory Visit: Payer: Self-pay | Admitting: Internal Medicine

## 2019-12-16 ENCOUNTER — Encounter: Payer: Self-pay | Admitting: Podiatry

## 2019-12-16 ENCOUNTER — Ambulatory Visit (INDEPENDENT_AMBULATORY_CARE_PROVIDER_SITE_OTHER): Payer: Medicare Other | Admitting: Podiatry

## 2019-12-16 ENCOUNTER — Other Ambulatory Visit: Payer: Self-pay

## 2019-12-16 VITALS — Temp 96.3°F

## 2019-12-16 DIAGNOSIS — L97522 Non-pressure chronic ulcer of other part of left foot with fat layer exposed: Secondary | ICD-10-CM

## 2019-12-16 DIAGNOSIS — M79675 Pain in left toe(s): Secondary | ICD-10-CM | POA: Diagnosis not present

## 2019-12-16 DIAGNOSIS — B351 Tinea unguium: Secondary | ICD-10-CM | POA: Diagnosis not present

## 2019-12-16 DIAGNOSIS — E11621 Type 2 diabetes mellitus with foot ulcer: Secondary | ICD-10-CM | POA: Diagnosis not present

## 2019-12-19 NOTE — Progress Notes (Signed)
Subjective:   Patient ID: [name], male   DOB: 85 y.o.   MRN: [id number]   HPI Patient presents stating that he has had some drainage from his left big toe and has history of amputation of his right leg and cannot cut the nails on his left toes and they get sore   ROS      Objective:  Physical Exam  Neurovascular status unchanged with patient found to have breakdown of tissue left hallux localized with nail disease and incurvation     Assessment:  Ulceration left big toe chronic in nature with no significant change with nail disease     Plan:  Debridement of area on the left big toe flush the area applied medication consent continue home treatment with wide shoes and debrided nailbeds.  At this point he is stable do not recommend antibiotics currently and I am pleased that we been able to so far save his lower leg on his left

## 2020-01-12 ENCOUNTER — Ambulatory Visit: Payer: Medicare Other | Attending: Internal Medicine

## 2020-01-12 DIAGNOSIS — Z23 Encounter for immunization: Secondary | ICD-10-CM | POA: Insufficient documentation

## 2020-01-12 NOTE — Progress Notes (Signed)
   Covid-19 Vaccination Clinic  Name:  [name]    MRN: [id number] DOB: 1935-06-23  01/12/2020  [name] was observed post Covid-19 immunization for 15 minutes without incidence. He was provided with Vaccine Information Sheet and instruction to access the V-Safe system.   [name] was instructed to call 911 with any severe reactions post vaccine: Marland Kitchen Difficulty breathing  . Swelling of your face and throat  . A fast heartbeat  . A bad rash all over your body  . Dizziness and weakness    Immunizations Administered    Name Date Dose VIS Date Route   Pfizer COVID-19 Vaccine 01/12/2020  2:58 PM 0.3 mL 11/01/2019 Intramuscular   Manufacturer: [place]   Lot: [id number]   [place]: [id number]

## 2020-02-05 ENCOUNTER — Ambulatory Visit: Payer: Medicare Other | Attending: Internal Medicine

## 2020-02-05 DIAGNOSIS — Z23 Encounter for immunization: Secondary | ICD-10-CM

## 2020-02-05 NOTE — Progress Notes (Signed)
   Covid-19 Vaccination Clinic  Name:  [name]    MRN: [id number] DOB: 09-02-1935  02/05/2020  [name] was observed post Covid-19 immunization for 15 minutes without incident. He was provided with Vaccine Information Sheet and instruction to access the V-Safe system.   [name] was instructed to call 911 with any severe reactions post vaccine: Marland Kitchen Difficulty breathing  . Swelling of face and throat  . A fast heartbeat  . A bad rash all over body  . Dizziness and weakness   Immunizations Administered    Name Date Dose VIS Date Route   Pfizer COVID-19 Vaccine 02/05/2020  2:07 PM 0.3 mL 11/01/2019 Intramuscular   Manufacturer: [place]   Lot: [id number]   [place]: [id number]

## 2020-03-02 ENCOUNTER — Ambulatory Visit: Payer: Self-pay | Admitting: Orthotics

## 2020-05-19 ENCOUNTER — Telehealth: Payer: Self-pay | Admitting: Podiatry

## 2020-05-19 NOTE — Telephone Encounter (Signed)
Pt called and asking if we had updated paperwork for diabetic shoes. I explained we did not have current paperwork because last time we talked he wanted to try the shoes on before we sent paperwork to Dr Delfina Redwood. I think pt mis understood me but said he seen Dr Delfina Redwood within the last month. I told pt I would mail paperwork to Dr Delfina Redwood to get it updated. He has an appt with Dr [name] on 7.14 and would like to get shoes at that time.

## 2020-05-19 NOTE — Telephone Encounter (Signed)
Left message for pt to call to discuss diabetic shoes per call center

## 2020-06-03 ENCOUNTER — Ambulatory Visit: Payer: Medicare Other | Admitting: Podiatry

## 2020-06-03 ENCOUNTER — Other Ambulatory Visit: Payer: Self-pay

## 2020-06-03 ENCOUNTER — Encounter: Payer: Self-pay | Admitting: Podiatry

## 2020-06-03 VITALS — Temp 97.8°F

## 2020-06-03 DIAGNOSIS — L97522 Non-pressure chronic ulcer of other part of left foot with fat layer exposed: Secondary | ICD-10-CM

## 2020-06-03 MED ORDER — CEPHALEXIN 500 MG PO CAPS: 500.0000 mg | ORAL_CAPSULE | Freq: Three times a day (TID) | ORAL | 1 refills | Status: DC

## 2020-06-04 NOTE — Progress Notes (Signed)
Subjective:   Patient ID: [name], male   DOB: 85 y.o.   MRN: [id number]   HPI Patient presents stating I have noted some drainage from my left big toe and it is really done pretty well up till now and its not been significantly painful except for occasional issues   ROS      Objective:  Physical Exam  Neurovascular status unchanged with loss of right leg with a low-grade draining ulceration of the left hallux measuring about 3 cm x 2 cm with subcutaneous but no bone exposure.  No proximal edema or edema drainage noted     Assessment:  Probability for ulceration of the left hallux that may have bacterial infiltration     Plan:  Sterile sharp debridement the area flushed the area out applied Iodosorb with sterile dressing and then I am placing on cephalexin 500 mg 3 times daily for 10 days and gave strict instructions of any further redness or other pathology were to occur to let us now and we are getting diabetic shoe for him to try to protect this area.  Patient will be seen back understands he has a high risk

## 2020-06-30 ENCOUNTER — Other Ambulatory Visit: Payer: Self-pay | Admitting: Podiatry

## 2020-06-30 ENCOUNTER — Telehealth: Payer: Self-pay | Admitting: Podiatry

## 2020-06-30 NOTE — Telephone Encounter (Signed)
I spoke with [name] and she states the toe is weeping again and Dr. [name] has been treating for about 7 years and he thinks that is what Dr. [name] would do, but it has already been approved. I reviewed Meds & Orders and the keflex had been approved by K. White, Engineer, agricultural.

## 2020-06-30 NOTE — Telephone Encounter (Signed)
Pt returned call to nurse.

## 2020-06-30 NOTE — Telephone Encounter (Signed)
Left message for pt to call concerning a refill request our office received.

## 2020-07-03 ENCOUNTER — Ambulatory Visit: Payer: Medicare Other | Admitting: Orthotics

## 2020-07-03 ENCOUNTER — Other Ambulatory Visit: Payer: Self-pay

## 2020-07-03 DIAGNOSIS — E1140 Type 2 diabetes mellitus with diabetic neuropathy, unspecified: Secondary | ICD-10-CM | POA: Diagnosis not present

## 2020-07-03 DIAGNOSIS — Z89431 Acquired absence of right foot: Secondary | ICD-10-CM | POA: Diagnosis not present

## 2020-07-03 DIAGNOSIS — L97522 Non-pressure chronic ulcer of other part of left foot with fat layer exposed: Secondary | ICD-10-CM

## 2020-07-16 ENCOUNTER — Other Ambulatory Visit: Payer: Medicare Other | Admitting: Orthotics

## 2020-07-16 ENCOUNTER — Other Ambulatory Visit: Payer: Self-pay | Admitting: Podiatry

## 2020-07-17 ENCOUNTER — Other Ambulatory Visit: Payer: Self-pay

## 2020-07-17 NOTE — Progress Notes (Signed)
Medication refill error.

## 2020-07-20 ENCOUNTER — Telehealth: Payer: Self-pay | Admitting: *Deleted

## 2020-07-20 ENCOUNTER — Other Ambulatory Visit: Payer: Self-pay | Admitting: Podiatry

## 2020-07-20 MED ORDER — CEPHALEXIN 500 MG PO CAPS
500.0000 mg | ORAL_CAPSULE | Freq: Three times a day (TID) | ORAL | 1 refills | Status: DC
Start: 2020-07-20 — End: 2020-09-02

## 2020-07-20 NOTE — Telephone Encounter (Signed)
Called patient back and left a voicemail message for patient to call me back.  Patient wanted to know if they needed to get a refill for Keflex.  I talked to Dr. [name], he stated, "If he is feeling better, he does not need a refill. If he does not feel better, we can go ahead and put another prescription in for Keflex".  Patient was originally prescribed Cephalexin (Keflex) 500 mg 3 times daily  Waiting for a response.

## 2020-08-11 ENCOUNTER — Ambulatory Visit: Payer: Medicare Other | Attending: Internal Medicine

## 2020-08-11 DIAGNOSIS — Z23 Encounter for immunization: Secondary | ICD-10-CM

## 2020-08-11 NOTE — Progress Notes (Signed)
   Covid-19 Vaccination Clinic  Name:  CHRISTAIN NIZNIK    MRN: [id number] DOB: 06-02-1935  08/11/2020  Mr. Sagan was observed post Covid-19 immunization for 15 minutes without incident. He was provided with Vaccine Information Sheet and instruction to access the V-Safe system.   Mr. Fenlon was instructed to call 911 with any severe reactions post vaccine: Marland Kitchen Difficulty breathing  . Swelling of face and throat  . A fast heartbeat  . A bad rash all over body  . Dizziness and weakness

## 2020-08-20 NOTE — Progress Notes (Signed)
Electrophysiology Office Note Date: 08/21/2020  ID:  [name], DOB 1935-10-17, MRN [id number]  PCP: Seward Carol, MD Primary Cardiologist: No primary care provider on file. Electrophysiologist: Virl Axe, MD   CC: Pacemaker follow-up  [name] is a 85 y.o. male seen today for Virl Axe, MD for routine electrophysiology followup after a hiatus of 2 years.  Since last being seen in our clinic the patient reports doing well overall. He had some mild SOB recently while putting on his pants. Otherwise he is at his baseline. He denies palpitations.  he denies chest pain, dyspnea, PND, orthopnea, nausea, vomiting, dizziness, syncope, edema, weight gain, or early satiety. He was under the impression that he was "not equipped" for remote monitoring, and is actually excited about the idea about it when discussed today.   Device History: St. Jude Single Chamber PPM implanted 06/2011 for SSS  Past Medical History:  Diagnosis Date  . Atrial fibrillation ([place])    permanent  . Backache, unspecified   . Cellulitis and abscess of leg, except foot   . CHF (congestive heart failure) ([place])   . Diverticulosis 05-2006   Dr. [name]   . Esophageal stricture   . Fatigue   . GERD (gastroesophageal reflux disease)   . Gout, unspecified   . Hx of colonic polyps 05-2006   (Adenomatous)Dr. [name]  . Hx of radiation therapy 06/16/14- 07/18/14   mult squamous cell carcinomas of scalp  . Hypertension   . Long term (current) use of anticoagulants   . Overweight(278.02)   . Pacemaker- Accident 02/20/2013  . Phlebitis and thrombophlebitis of superficial vessels of lower extremities   . Right bundle branch block   . Skin cancer    squamous cell carcinoma scalp  . Syncope and collapse   . 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 Type II or unspecified type diabetes mellitus without mention of complication, not stated as uncontrolled   . Unspecified venous (peripheral) insufficiency      Past Surgical History:  Procedure Laterality Date  . ABOVE KNEE LEG AMPUTATION Right 04/02/2014   DR DUDA  . AMPUTATION Right 01/20/2014   Procedure: AMPUTATION BELOW KNEE- right;  Surgeon: [name], MD;  Location: [facility];  Service: Orthopedics;  Laterality: Right;  Right Below Knee Amputation  . AMPUTATION Right 02/14/2014   Procedure: AMPUTATION BELOW KNEE;  Surgeon: [name], MD;  Location: [place];  Service: Orthopedics;  Laterality: Right;  Right Below Knee Amputation Revision  . AMPUTATION Right 03/07/2014   Procedure: AMPUTATION BELOW KNEE;  Surgeon: [name], MD;  Location: [place];  Service: Orthopedics;  Laterality: Right;  Right Below Knee Amputation Revision  . AMPUTATION Right 03/18/2014   Procedure: revision of  AMPUTATION BELOW KNEE;  Surgeon: [name], MD;  Location: [place];  Service: Orthopedics;  Laterality: Right;  Revision Right Below Knee Amputation  . AMPUTATION Right 04/02/2014   Procedure: AMPUTATION ABOVE KNEE;  Surgeon: [name], MD;  Location: [facility];  Service: Orthopedics;  Laterality: Right;  Right Above Knee Amputation  . EYE SURGERY    . I & D EXTREMITY Right 01/17/2014   Procedure: IRRIGATION AND DEBRIDEMENT RIGHT HEEL  WITH CULTURES AND BONE BIOPSY, placement of wound vac;  Surgeon: Theodoro Kos, DO;  Location: [facility];  Service: Plastics;  Laterality: Right;  . LAMINECTOMY    . LUMBAR FUSION    . PACEMAKER INSERTION  8/12   [facility]  by [name] for tachy/brady syndrome  . ROTATOR CUFF REPAIR    . ruptured rt rectus muscle    . SKIN BIOPSY     scalp    Current Outpatient Medications  Medication Sig Dispense Refill  . ACCU-CHEK SOFTCLIX LANCETS lancets Use to check blood sugar three times daily. Dx E11.9 300 each 3  . allopurinol (ZYLOPRIM) 100 MG tablet Take 1 tablet (100 mg total) by mouth daily. 10 tablet 0  . apixaban (ELIQUIS) 5 MG TABS tablet Take 5 mg by mouth 2 (two) times daily.     . B-D ULTRAFINE III SHORT PEN 31G X 8 MM MISC USE AS  DIRECTED 100 each 11  . baclofen (LIORESAL) 10 MG tablet Take 10 mg by mouth as needed.    . Blood Glucose Calibration (ACCU-CHEK AVIVA) SOLN Use as Directed. Dx E11.9 3 each 3  . cephALEXin (KEFLEX) 500 MG capsule Take 1 capsule (500 mg total) by mouth 3 (three) times daily. Take 1 pill by mouth, 3 times a day 30 capsule 1  . diltiazem (CARDIZEM CD) 180 MG 24 hr capsule 180 mg daily.     . furosemide (LASIX) 20 MG tablet Take 20 mg by mouth every other day.    . gabapentin (NEURONTIN) 100 MG capsule Take 100 mg by mouth 2 (two) times daily.    Marland Kitchen glucose blood (ACCU-CHEK AVIVA PLUS) test strip DX E11.9, check blood sugar three times daily 300 each 11  . hydrocortisone 2.5 % ointment Apply topically as needed.    . Insulin Glargine (LANTUS SOLOSTAR ) Inject into the skin as needed.     . insulin lispro (HUMALOG KWIKPEN) 100 UNIT/ML KiwkPen Inject into the skin as needed.     . methocarbamol (ROBAXIN) 750 MG tablet Take by mouth as needed.    . Multiple Vitamins-Minerals (MULTIVITAMIN PO) Take 1 tablet by mouth daily. Takes a Men's 50 Plus Multivitamin    . nystatin cream (MYCOSTATIN) Apply 1 application topically as needed.    Marland Kitchen omeprazole (PRILOSEC) 20 MG capsule Take 20 mg by mouth daily.    . Oxycodone HCl 10 MG TABS Take 1 tablet (10 mg total) by mouth every 8 (eight) hours as needed. For pain 90 tablet 0  . triamcinolone cream (KENALOG) 0.1 % as needed.     No current facility-administered medications for this visit.    Allergies:   Morphine   Social History: Social History   Socioeconomic History  . Marital status: Married    Spouse name: Not on file  . Number of children: 2  . Years of education: Not on file  . Highest education level: Not on file  Occupational History  . Occupation: Retired Hotel manager  Tobacco Use  . Smoking status: Former Research scientist (life sciences)  . Smokeless tobacco: Never Used  . Tobacco comment: Quit at age 16   Vaping Use  . Vaping Use: Never used  Substance and  Sexual Activity  . Alcohol use: No    Alcohol/week: 0.0 standard drinks    Comment: none currenlty  . Drug use: No  . Sexual activity: Not Currently  Other Topics Concern  . 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  Difficulty of Paying Living Expenses: Not on file  Food Insecurity:   . Worried About Charity fundraiser in the Last Year: Not on file  . Ran Out of Food in the Last Year: Not on file  Transportation Needs:   . Lack of Transportation (Medical): Not on file  . Lack of Transportation (Non-Medical): Not on file  Physical Activity:   . Days of Exercise per Week: Not on file  . Minutes of Exercise per Session: Not on file  Stress:   . Feeling of Stress : Not on file  Social Connections:   . Frequency of Communication with Friends and Family: Not on file  . Frequency of Social Gatherings with Friends and Family: Not on file  . Attends Religious Services: Not on file  . Active Member of Clubs or Organizations: Not on file  . Attends Archivist Meetings: Not on file  . Marital Status: Not on file  Intimate Partner Violence:   . Fear of Current or Ex-Partner: Not on file  . Emotionally Abused: Not on file  . Physically Abused: Not on file  . Sexually Abused: Not on file    Family History: Family History  Problem Relation Age of Onset  . Pneumonia Mother   . Heart disease Father   . Diabetes Father   . Colon cancer Neg Hx      Review of Systems: All other systems reviewed and are otherwise negative except as noted above.  Physical Exam: Vitals:   08/21/20 1316  BP: 118/60  Pulse: 75  SpO2: 98%     GEN- The patient is well appearing, alert and oriented x 3 today.   HEENT: normocephalic, atraumatic;  sclera clear, conjunctiva pink; hearing intact; oropharynx clear; neck supple  Lungs- Clear to ausculation bilaterally, normal work of breathing.  No wheezes, rales, rhonchi Heart- Regular rate and rhythm, no murmurs, rubs or gallops  GI- soft, non-tender, non-distended, bowel sounds present  Extremities- no clubbing or cyanosis. No edema MS- no significant deformity or atrophy Skin- warm and dry, no rash or lesion; PPM pocket well healed Psych- euthymic mood, full affect Neuro- strength and sensation are intact  PPM Interrogation- reviewed in detail today,  See PACEART report  EKG:  EKG is ordered today. The ekg ordered today shows AF with controlled Ventricular rate at 75 bpm, personally reviewed  Recent Labs: No results found for requested labs within last 8760 hours.   Wt Readings from Last 3 Encounters:  12/05/18 168 lb (76.2 kg)  07/10/18 180 lb 9.6 oz (81.9 kg)  06/21/17 176 lb (79.8 kg)     Other studies Reviewed: Additional studies/ records that were reviewed today include: Previous EP office notes, Previous remote checks, Most recent labwork.   Assessment and Plan:  1. Sick sinus syndrome s/p St. Jude PPM  Normal PPM function See Claudia Desanctis Art report No changes today He is listed as having previously refused remote monitoring, but tells me he was under the impression his device was not compatible. We are out of monitors in the office, so I spoke with [place] who will pair a device with his on the back end and have it mailed to his house. He is actually quite excited about this prospect in our discussion today.   2. Permanent atrial fibrillation He has intermittent high ventricular rates, but prefers to remain on his current therapy today.  It is unclear if his occasional high rates are associated with any symptoms. He agrees  to remote monitoring as above to help [place] better follow this.  CHA2DS2VASC of at least 7   Offered to increase diltiazem. Continue current dose for  now.   Current medicines are reviewed at length with the patient today.   The patient does not have concerns regarding his medicines.  The following changes were made today:  none  Labs/ tests ordered today include:  Orders Placed This Encounter  Procedures  . Basic Metabolic Panel (BMET)  . CBC w/Diff  . CUP PACEART INCLINIC DEVICE CHECK  . EKG 12-Lead    Disposition:   Follow up with Dr. Caryl Comes in 6 Months    Signed, Shirley Friar, PA-C  08/21/2020 1:52 PM  [address] [place] [address] [phone] (office) [phone] (fax)

## 2020-08-21 ENCOUNTER — Other Ambulatory Visit: Payer: Self-pay

## 2020-08-21 ENCOUNTER — Ambulatory Visit (INDEPENDENT_AMBULATORY_CARE_PROVIDER_SITE_OTHER): Payer: Medicare Other | Admitting: Student

## 2020-08-21 ENCOUNTER — Encounter: Payer: Self-pay | Admitting: Student

## 2020-08-21 VITALS — BP 118/60 | HR 75

## 2020-08-21 DIAGNOSIS — I10 Essential (primary) hypertension: Secondary | ICD-10-CM

## 2020-08-21 DIAGNOSIS — I5032 Chronic diastolic (congestive) heart failure: Secondary | ICD-10-CM

## 2020-08-21 DIAGNOSIS — I4891 Unspecified atrial fibrillation: Secondary | ICD-10-CM | POA: Diagnosis not present

## 2020-08-21 DIAGNOSIS — I495 Sick sinus syndrome: Secondary | ICD-10-CM | POA: Diagnosis not present

## 2020-08-21 LAB — CUP PACEART INCLINIC DEVICE CHECK
Battery Remaining Longevity: 100 mo
Battery Voltage: 2.92 V
Brady Statistic RV Percent Paced: 17 %
Date Time Interrogation Session: 20211001134125
Implantable Lead Implant Date: 20120817
Implantable Lead Location: 753860
Implantable Lead Model: 1948
Implantable Pulse Generator Implant Date: 20120817
Lead Channel Impedance Value: 437.5 Ohm
Lead Channel Pacing Threshold Amplitude: 0.75 V
Lead Channel Pacing Threshold Amplitude: 0.75 V
Lead Channel Pacing Threshold Pulse Width: 0.4 ms
Lead Channel Pacing Threshold Pulse Width: 0.4 ms
Lead Channel Sensing Intrinsic Amplitude: 8.8 mV
Lead Channel Setting Pacing Amplitude: 2 V
Lead Channel Setting Pacing Pulse Width: 0.4 ms
Lead Channel Setting Sensing Sensitivity: 2 mV
Pulse Gen Model: 1210
Pulse Gen Serial Number: 7244744

## 2020-08-21 NOTE — Patient Instructions (Addendum)
Medication Instructions:  *If you need a refill on your cardiac medications before your next appointment, please call your pharmacy*  Lab Work: Your physician has recommended that you have lab work today: BMET and CBC  If you have labs (blood work) drawn today and your tests are completely normal, you will receive your results only by: Marland Kitchen MyChart Message (if you have MyChart) OR . A paper copy in the mail If you have any lab test that is abnormal or we need to change your treatment, we will call you to review the results.  Follow-Up: At [facility], you and your health needs are our priority.  As part of our continuing mission to provide you with exceptional heart care, we have created designated Provider Care Teams.  These Care Teams include your primary Cardiologist (physician) and Advanced Practice Providers (APPs -  Physician Assistants and Nurse Practitioners) who all work together to provide you with the care you need, when you need it.  We recommend signing up for the patient portal called "MyChart".  Sign up information is provided on this After Visit Summary.  MyChart is used to connect with patients for Virtual Visits (Telemedicine).  Patients are able to view lab/test results, encounter notes, upcoming appointments, etc.  Non-urgent messages can be sent to your provider as well.   To learn more about what you can do with MyChart, go to [email or web address].    Your next appointment:   Your physician wants you to follow-up in: 6 MONTHS with Dr. Caryl Comes. -- Tuesday 02/23/21 at 2:15 pm   The format for your next appointment:   In Person with Virl Axe, MD

## 2020-08-22 LAB — CBC WITH DIFFERENTIAL/PLATELET
Basophils Absolute: 0.1 10*3/uL (ref 0.0–0.2)
Basos: 1 %
EOS (ABSOLUTE): 0.2 10*3/uL (ref 0.0–0.4)
Eos: 2 %
Hematocrit: 32.6 % — ABNORMAL LOW (ref 37.5–51.0)
Hemoglobin: 10.5 g/dL — ABNORMAL LOW (ref 13.0–17.7)
Immature Grans (Abs): 0.1 10*3/uL (ref 0.0–0.1)
Immature Granulocytes: 1 %
Lymphocytes Absolute: 1.2 10*3/uL (ref 0.7–3.1)
Lymphs: 19 %
MCH: 26.9 pg (ref 26.6–33.0)
MCHC: 32.2 g/dL (ref 31.5–35.7)
MCV: 84 fL (ref 79–97)
Monocytes Absolute: 0.4 10*3/uL (ref 0.1–0.9)
Monocytes: 6 %
Neutrophils Absolute: 4.6 10*3/uL (ref 1.4–7.0)
Neutrophils: 71 %
Platelets: 192 10*3/uL (ref 150–450)
RBC: 3.9 x10E6/uL — ABNORMAL LOW (ref 4.14–5.80)
RDW: 14.3 % (ref 11.6–15.4)
WBC: 6.5 10*3/uL (ref 3.4–10.8)

## 2020-08-22 LAB — BASIC METABOLIC PANEL
BUN/Creatinine Ratio: 9 — ABNORMAL LOW (ref 10–24)
BUN: 13 mg/dL (ref 8–27)
CO2: 25 mmol/L (ref 20–29)
Calcium: 8.6 mg/dL (ref 8.6–10.2)
Chloride: 104 mmol/L (ref 96–106)
Creatinine, Ser: 1.38 mg/dL — ABNORMAL HIGH (ref 0.76–1.27)
GFR calc Af Amer: 54 mL/min/{1.73_m2} — ABNORMAL LOW (ref >59)
GFR calc non Af Amer: 47 mL/min/{1.73_m2} — ABNORMAL LOW (ref >59)
Glucose: 114 mg/dL — ABNORMAL HIGH (ref 65–99)
Potassium: 3.6 mmol/L (ref 3.5–5.2)
Sodium: 140 mmol/L (ref 134–144)

## 2020-08-24 ENCOUNTER — Ambulatory Visit: Payer: Medicare Other | Admitting: Podiatry

## 2020-08-24 ENCOUNTER — Telehealth: Payer: Self-pay

## 2020-08-24 ENCOUNTER — Ambulatory Visit: Payer: Medicare Other | Admitting: Orthotics

## 2020-08-24 DIAGNOSIS — I5032 Chronic diastolic (congestive) heart failure: Secondary | ICD-10-CM

## 2020-08-24 DIAGNOSIS — I10 Essential (primary) hypertension: Secondary | ICD-10-CM

## 2020-08-24 MED ORDER — POTASSIUM CHLORIDE ER 10 MEQ PO TBCR: 10.0000 meq | EXTENDED_RELEASE_TABLET | ORAL | 3 refills | Status: DC

## 2020-08-24 NOTE — Telephone Encounter (Signed)
-----   Message from Shirley Friar, PA-C sent at 08/24/2020  8:39 AM EDT ----- K borderline low.   Can we please have him take 20 meq of K x 1 and then start 10 meq of K on days he takes lasix (listed as every other day, please re-confirm). Please repeat BMET 2 weeks.   Thank you!  Legrand Como [address] [place], PA-C  08/24/2020 8:39 AM

## 2020-08-24 NOTE — Telephone Encounter (Signed)
Confirmed results and recommendations with patient.  Pt is aware and agreeable to taking 20 meq of potassium on Wed and 10 Meq on Friday and all the days following Pt is aware and agreeable to taking potassium supplement on the same days he takes his furosemide (qod) Pt is agreeable to repeat bmet on 10/18. orders are in

## 2020-08-24 NOTE — Telephone Encounter (Signed)
Lmtcb for lab results and recommendations 

## 2020-08-27 ENCOUNTER — Ambulatory Visit: Payer: Medicare Other | Admitting: Orthotics

## 2020-08-27 ENCOUNTER — Ambulatory Visit (INDEPENDENT_AMBULATORY_CARE_PROVIDER_SITE_OTHER): Payer: Medicare Other | Admitting: Podiatry

## 2020-08-27 ENCOUNTER — Encounter: Payer: Self-pay | Admitting: Podiatry

## 2020-08-27 ENCOUNTER — Other Ambulatory Visit: Payer: Self-pay

## 2020-08-27 DIAGNOSIS — L97522 Non-pressure chronic ulcer of other part of left foot with fat layer exposed: Secondary | ICD-10-CM

## 2020-08-27 NOTE — Progress Notes (Signed)
Patient ran out of time due to previous appointment wi Dr. [name]; will reschedule.

## 2020-08-28 ENCOUNTER — Telehealth: Payer: Self-pay | Admitting: Student

## 2020-08-28 ENCOUNTER — Encounter (HOSPITAL_COMMUNITY): Payer: Self-pay | Admitting: Emergency Medicine

## 2020-08-28 ENCOUNTER — Emergency Department (HOSPITAL_COMMUNITY)
Admission: EM | Admit: 2020-08-28 | Discharge: 2020-08-28 | Disposition: A | Discharge status: Home / Self Care | Payer: Medicare Other | Attending: Emergency Medicine | Admitting: Emergency Medicine

## 2020-08-28 DIAGNOSIS — E876 Hypokalemia: Secondary | ICD-10-CM | POA: Diagnosis not present

## 2020-08-28 DIAGNOSIS — I5032 Chronic diastolic (congestive) heart failure: Secondary | ICD-10-CM | POA: Diagnosis not present

## 2020-08-28 DIAGNOSIS — Z87891 Personal history of nicotine dependence: Secondary | ICD-10-CM | POA: Insufficient documentation

## 2020-08-28 DIAGNOSIS — Z794 Long term (current) use of insulin: Secondary | ICD-10-CM | POA: Insufficient documentation

## 2020-08-28 DIAGNOSIS — N183 Chronic kidney disease, stage 3 unspecified: Secondary | ICD-10-CM | POA: Insufficient documentation

## 2020-08-28 DIAGNOSIS — E1122 Type 2 diabetes mellitus with diabetic chronic kidney disease: Secondary | ICD-10-CM | POA: Insufficient documentation

## 2020-08-28 DIAGNOSIS — R1310 Dysphagia, unspecified: Secondary | ICD-10-CM | POA: Insufficient documentation

## 2020-08-28 DIAGNOSIS — Z950 Presence of cardiac pacemaker: Secondary | ICD-10-CM | POA: Insufficient documentation

## 2020-08-28 DIAGNOSIS — I130 Hypertensive heart and chronic kidney disease with heart failure and stage 1 through stage 4 chronic kidney disease, or unspecified chronic kidney disease: Secondary | ICD-10-CM | POA: Insufficient documentation

## 2020-08-28 DIAGNOSIS — Z79899 Other long term (current) drug therapy: Secondary | ICD-10-CM | POA: Insufficient documentation

## 2020-08-28 DIAGNOSIS — Z7901 Long term (current) use of anticoagulants: Secondary | ICD-10-CM | POA: Diagnosis not present

## 2020-08-28 DIAGNOSIS — Z20822 Contact with and (suspected) exposure to covid-19: Secondary | ICD-10-CM | POA: Diagnosis not present

## 2020-08-28 LAB — BASIC METABOLIC PANEL
Anion gap: 15 (ref 5–15)
BUN: 14 mg/dL (ref 8–23)
CO2: 21 mmol/L — ABNORMAL LOW (ref 22–32)
Calcium: 9 mg/dL (ref 8.9–10.3)
Chloride: 106 mmol/L (ref 98–111)
Creatinine, Ser: 1.58 mg/dL — ABNORMAL HIGH (ref 0.61–1.24)
GFR, Estimated: 40 mL/min — ABNORMAL LOW (ref >60)
Glucose, Bld: 135 mg/dL — ABNORMAL HIGH (ref 70–99)
Potassium: 2.9 mmol/L — ABNORMAL LOW (ref 3.5–5.1)
Sodium: 142 mmol/L (ref 135–145)

## 2020-08-28 LAB — RESPIRATORY PANEL BY RT PCR (FLU A&B, COVID)
Influenza A by PCR: NEGATIVE
Influenza B by PCR: NEGATIVE
SARS Coronavirus 2 by RT PCR: NEGATIVE

## 2020-08-28 LAB — CBC WITH DIFFERENTIAL/PLATELET
Abs Immature Granulocytes: 0.01 10*3/uL (ref 0.00–0.07)
Basophils Absolute: 0.1 10*3/uL (ref 0.0–0.1)
Basophils Relative: 1 %
Eosinophils Absolute: 0 10*3/uL (ref 0.0–0.5)
Eosinophils Relative: 1 %
HCT: 38 % — ABNORMAL LOW (ref 39.0–52.0)
Hemoglobin: 11.9 g/dL — ABNORMAL LOW (ref 13.0–17.0)
Immature Granulocytes: 0 %
Lymphocytes Relative: 15 %
Lymphs Abs: 1 10*3/uL (ref 0.7–4.0)
MCH: 26.4 pg (ref 26.0–34.0)
MCHC: 31.3 g/dL (ref 30.0–36.0)
MCV: 84.4 fL (ref 80.0–100.0)
Monocytes Absolute: 0.4 10*3/uL (ref 0.1–1.0)
Monocytes Relative: 5 %
Neutro Abs: 5.3 10*3/uL (ref 1.7–7.7)
Neutrophils Relative %: 78 %
Platelets: 219 10*3/uL (ref 150–400)
RBC: 4.5 MIL/uL (ref 4.22–5.81)
RDW: 15.2 % (ref 11.5–15.5)
WBC: 6.8 10*3/uL (ref 4.0–10.5)
nRBC: 0 % (ref 0.0–0.2)

## 2020-08-28 MED ORDER — APIXABAN 5 MG PO TABS: 5.0000 mg | ORAL_TABLET | Freq: Two times a day (BID) | ORAL | Status: DC

## 2020-08-28 MED ORDER — SODIUM CHLORIDE 0.9 % IV BOLUS
500.0000 mL | Freq: Once | INTRAVENOUS | Status: AC
  Administered 2020-08-28: 500 mL via INTRAVENOUS

## 2020-08-28 MED ORDER — DIPHENHYDRAMINE HCL 50 MG/ML IJ SOLN
25.0000 mg | Freq: Once | INTRAMUSCULAR | Status: AC
  Administered 2020-08-28: 25 mg via INTRAVENOUS
  Filled 2020-08-28: qty 1

## 2020-08-28 MED ORDER — APIXABAN 2.5 MG PO TABS
2.5000 mg | ORAL_TABLET | Freq: Two times a day (BID) | ORAL | Status: DC
  Administered 2020-08-28: 2.5 mg via ORAL
  Filled 2020-08-28: qty 1

## 2020-08-28 MED ORDER — POTASSIUM CHLORIDE 20 MEQ PO PACK: 20.0000 meq | PACK | Freq: Every day | ORAL | 0 refills | Status: DC

## 2020-08-28 MED ORDER — POTASSIUM CHLORIDE 10 MEQ/100ML IV SOLN
10.0000 meq | Freq: Once | INTRAVENOUS | Status: AC
  Administered 2020-08-28: 10 meq via INTRAVENOUS
  Filled 2020-08-28: qty 100

## 2020-08-28 MED ORDER — METOCLOPRAMIDE HCL 5 MG/ML IJ SOLN
5.0000 mg | Freq: Once | INTRAMUSCULAR | Status: AC
  Administered 2020-08-28: 5 mg via INTRAVENOUS
  Filled 2020-08-28: qty 2

## 2020-08-28 MED ORDER — ALUM & MAG HYDROXIDE-SIMETH 200-200-20 MG/5ML PO SUSP
30.0000 mL | Freq: Once | ORAL | Status: AC
  Administered 2020-08-28: 30 mL via ORAL
  Filled 2020-08-28: qty 30

## 2020-08-28 MED ORDER — LIDOCAINE VISCOUS HCL 2 % MT SOLN
15.0000 mL | Freq: Once | OROMUCOSAL | Status: AC
  Administered 2020-08-28: 15 mL via ORAL
  Filled 2020-08-28: qty 15

## 2020-08-28 MED ORDER — GLUCAGON HCL RDNA (DIAGNOSTIC) 1 MG IJ SOLR
1.0000 mg | Freq: Once | INTRAMUSCULAR | Status: AC
  Administered 2020-08-28: 1 mg via INTRAVENOUS
  Filled 2020-08-28: qty 1

## 2020-08-28 NOTE — Telephone Encounter (Signed)
Agree that he needs urgent follow up.   Per Epic he followed advice and is currently in ER.   Legrand Como [address]" [place], PA-C  08/28/2020 1:44 PM

## 2020-08-28 NOTE — ED Notes (Signed)
The pt is able to swallow water and so far it has not come back up

## 2020-08-28 NOTE — Discharge Instructions (Addendum)
Stop taking the potassium pills.  I have written you for a potassium packet.  This is a medication that you can dump into the juice of your choice, yogurt, applesauce intake.  Follow-up with GI for reevaluation

## 2020-08-28 NOTE — ED Notes (Signed)
eliquist is ordered but not verified  Pharmacy notified and reports that the med cannot be verified until the pharmacy tech finishes  Filling out the paper work

## 2020-08-28 NOTE — ED Notes (Signed)
The pt is c/o some difficulty swallowing since 1600  He has a history of the same  He had his esophagus stretched in the past  No pain  A and o x 4

## 2020-08-28 NOTE — ED Notes (Addendum)
Patient verbalizes understanding of discharge instructions. Opportunity for questioning and answers were provided. Armband removed by staff, pt discharged from ED via [place].

## 2020-08-28 NOTE — Telephone Encounter (Signed)
Pt c/o medication issue:  1. Name of Medication: potassium chloride (KLOR-CON) 10 MEQ tablet  2. How are you currently taking this medication (dosage and times per day)? 1 tablet every other day  3. Are you having a reaction (difficulty breathing--STAT)? no  4. What is your medication issue? Patient states he started having swelling in his throat when he took the medication yesterday. He states he can't keep anything down in his throat. He also states yesterday he had severe pain when he breathed in deep.

## 2020-08-28 NOTE — ED Triage Notes (Signed)
Pt brought to ED by [facility] from home for c/o not been able to swallow or eat for the past 2 days, pt states he is not taking his medication because he has a lump on his throat and a very thick secretion coming from his throat.  Prior hx of throat surgery. BP 138/88, HR 88, R 18. SPO2 97% CBG 166

## 2020-08-28 NOTE — Telephone Encounter (Signed)
Spoke with the patient who states he took 10 mEq K-CLOR for the first time yesterday and has been having issues with his throat ever since. Patient states he had trouble swallowing the pill and then his throat has been swollen ever since. He denies difficulty breathing but has been unable to eat or drink anything since yesterday afternoon when he took the pill. Patient states he has a call into his PCP and is awaiting a call back. He does not have any other allergies besides Morphine. Advised patient to go to the ED as he may be experiencing an anaphylactic reaction. He states he will not go to the ED and requests any alternative. Informed the patient that unfortunately there is no other alternative we can offer at this time and encouraged the patient to go to the ED. He states he will wait for a call back from the PCP.

## 2020-08-28 NOTE — ED Provider Notes (Signed)
Care of the patient was assumed from B. Henderely PA-C at 630; see this physician's note for complete history of present illness, review of systems, and physical exam.  Briefly, the patient is a 85 y.o. male who presented to the ED with trouble swallowing.  .  Plan at time of handoff: Awaiting Eliquis PO challenge. If passes can go home with outpatient GI, if unable to tolerate contact GI.    Physical Exam  BP (!) 112/92   Pulse 93   Temp 98.7 F (37.1 C)   Resp 15   Ht 5\' 9"  (1.753 m)   Wt 76.2 kg   SpO2 99%   BMI 24.81 kg/m    ED Course/Procedures     Procedures  MDM  Pt is an 85 year old male that came in for evaluation of dysphagia.  Was given Reglan, Benadryl and glucagon.  Patient was passed p.o., was concerned about tolerating p.o. at home therefore previous provider wanted him to take Eliquis here to make sure he could pass p.o Eliquis. Potassium powder prescription given.   [place] by nursing that patient was able to take Eliquis without difficulty.  Patient to be discharged, GI referral given. Pt was seen with Dr. Darl Householder.         Alfredia Client, PA-C 08/28/20 2143    Drenda Freeze, MD 08/28/20 2214

## 2020-08-28 NOTE — ED Provider Notes (Signed)
[facility] EMERGENCY DEPARTMENT Provider Note   CSN: [id number] Arrival date & time: 08/28/20  1230     History Chief Complaint  Patient presents with  . unable to swallow    [name] is a 85 y.o. male with past medical history significant for permanent A. fib, on anticoagulation, Eliquis, prior esophageal stricture who presents for evaluation of dysphagia.  [facility] PCP, called and recommended come to the emergency department and be seen by GI.  Taking pills yesterday and felt them get stuck. Was taking Potassium Chloride pill for the first time.  Patient was concerned about possible allergic reaction as this was his first time taking this pain.  Patient denies sensation of throat closing, urticaria. Has been unable to tolerate liquids or solids since. Around 2 days per patient. Tried eating hot dogs with subsequent emesis. Feels dehydrated. Not currently followed by GI however has had esophageal stricture 20 years ago.  Has an aggravating feeling in his throat.  He is able to tolerate his saliva however is regurgitating "thick mucus."  No shortness of breath, cough, chest pain, weakness.  No urticaria. Pain 0/10 currently.  Denies headache, lightheadedness, dizziness, chest pain, shortness of breath, hemoptysis, domino pain, diarrhea, dysuria.  History obtained from patient and past medical records.  No interpreter is used.   HPI     Past Medical History:  Diagnosis Date  . Atrial fibrillation ([place])    permanent  . Backache, unspecified   . Cellulitis and abscess of leg, except foot   . CHF (congestive heart failure) ([place])   . Diverticulosis 05-2006   Dr. [name]   . Esophageal stricture   . Fatigue   . GERD (gastroesophageal reflux disease)   . Gout, unspecified   . Hx of colonic polyps 05-2006   (Adenomatous)Dr. [name]  . Hx of radiation therapy 06/16/14- 07/18/14   mult squamous cell carcinomas of scalp  . Hypertension   . Long term (current)  use of anticoagulants   . Overweight(278.02)   . Pacemaker- [place] 02/20/2013  . Phlebitis and thrombophlebitis of superficial vessels of lower extremities   . Right bundle branch block   . Skin cancer    squamous cell carcinoma scalp  . Syncope and collapse   . Tachycardia-bradycardia ([place])    s/p PPM  . Type II or unspecified type diabetes mellitus without mention of complication, not stated as uncontrolled   . Unspecified venous (peripheral) insufficiency     Patient Active Problem List   Diagnosis Date Noted  . Sepsis ([place]) 03/23/2017  . CKD (chronic kidney disease), stage III ([place]) 03/23/2017  . Leukocytosis   . Cellulitis 03/22/2017  . Diabetic foot ulcer associated with type 2 diabetes mellitus ([place]) 11/24/2014  . 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 Unilateral AKA, right ([place]) 04/08/2014  . PAD (peripheral artery disease) ([place]) 04/08/2014  . Gangrene associated with type 2 diabetes mellitus ([place]) 04/02/2014  . Generalized weakness 03/23/2014  . Wound dehiscence 03/15/2014  . Coagulopathy ([place]) 03/09/2014  . Anemia due to acute blood loss 03/08/2014  . Diabetes mellitus ([place]) 03/02/2014  . Anemia 03/01/2014  . Constipation 02/18/2014  . Insomnia 02/18/2014  . Amputation stump infection ([place]) 02/06/2014  . Hx of right BKA ([place]) 01/28/2014  . Osteomyelitis ([place]) 01/16/2014  . Diabetic osteomyelitis ([place]) 01/09/2014  . Pacemaker- [name] 02/20/2013  . Hyperlipidemia 12/12/2012  . TIA (transient ischemic attack) 12/11/2012  . ETOH abuse 04/02/2012  .  ACTINIC KERATOSIS 11/29/2010  . MULTIPLE&UNSPEC OPEN WOUND LOWER LIMB COMP 10/19/2010  . ADHESIVE CAPSULITIS OF SHOULDER 08/30/2010  . ESOPHAGEAL STRICTURE 05/19/2010  . DYSPHAGIA UNSPECIFIED 04/08/2010  . DUPUYTREN'S CONTRACTURE, LEFT 12/21/2009  . CERVICAL STRAIN, ACUTE 08/28/2009  . OVERWEIGHT/OBESITY 04/07/2009  . RBBB 04/07/2009  . GERD 04/07/2009  . SYNCOPE AND COLLAPSE 04/07/2009    . FATIGUE / MALAISE 04/07/2009  . PHLEBITIS&THROMBOPHLEB SUP VESSELS LOWER EXTREM 02/28/2008  . CELLULITIS/ABSCESS, LEG 02/28/2008  . BACK PAIN, CHRONIC 10/31/2007  . Unspecified venous (peripheral) insufficiency 07/02/2007  . GOUT 06/26/2007  . Essential hypertension 06/26/2007  . Atrial fibrillation ([place]) 06/26/2007  . Chronic diastolic CHF (congestive heart failure) ([place]) 06/26/2007    Past Surgical History:  Procedure Laterality Date  . ABOVE KNEE LEG AMPUTATION Right 04/02/2014   DR DUDA  . AMPUTATION Right 01/20/2014   Procedure: AMPUTATION BELOW KNEE- right;  Surgeon: [name], MD;  Location: [facility];  Service: Orthopedics;  Laterality: Right;  Right Below Knee Amputation  . AMPUTATION Right 02/14/2014   Procedure: AMPUTATION BELOW KNEE;  Surgeon: [name], MD;  Location: [facility];  Service: Orthopedics;  Laterality: Right;  Right Below Knee Amputation Revision  . AMPUTATION Right 03/07/2014   Procedure: AMPUTATION BELOW KNEE;  Surgeon: [name], MD;  Location: [facility];  Service: Orthopedics;  Laterality: Right;  Right Below Knee Amputation Revision  . AMPUTATION Right 03/18/2014   Procedure: revision of  AMPUTATION BELOW KNEE;  Surgeon: [name], MD;  Location: [facility];  Service: Orthopedics;  Laterality: Right;  Revision Right Below Knee Amputation  . AMPUTATION Right 04/02/2014   Procedure: AMPUTATION ABOVE KNEE;  Surgeon: [name], MD;  Location: [facility];  Service: Orthopedics;  Laterality: Right;  Right Above Knee Amputation  . EYE SURGERY    . I & D EXTREMITY Right 01/17/2014   Procedure: IRRIGATION AND DEBRIDEMENT RIGHT HEEL  WITH CULTURES AND BONE BIOPSY, placement of wound vac;  Surgeon: Theodoro Kos, DO;  Location: [facility];  Service: Plastics;  Laterality: Right;  . LAMINECTOMY    . LUMBAR FUSION    . PACEMAKER INSERTION  8/12   SJM by [name] for tachy/brady syndrome  . ROTATOR CUFF REPAIR    . ruptured rt rectus muscle    . SKIN BIOPSY     scalp        Family History  Problem Relation Age of Onset  . Pneumonia Mother   . Heart disease Father   . Diabetes Father   . Colon cancer Neg Hx     Social History   Tobacco Use  . Smoking status: Former Research scientist (life sciences)  . Smokeless tobacco: Never Used  . Tobacco comment: Quit at age 32   Vaping Use  . Vaping Use: Never used  Substance Use Topics  . Alcohol use: No    Alcohol/week: 0.0 standard drinks    Comment: none currenlty  . Drug use: No    Home Medications Prior to Admission medications   Medication Sig Start Date End Date Taking? Authorizing Provider  ACCU-CHEK SOFTCLIX LANCETS lancets Use to check blood sugar three times daily. Dx E11.9 04/26/17   Reed, Tiffany L, DO  allopurinol (ZYLOPRIM) 100 MG tablet Take 1 tablet (100 mg total) by mouth daily. 12/05/18   Angelia Mould, MD  apixaban (ELIQUIS) 5 MG TABS tablet Take 5 mg by mouth 2 (two) times daily.     [provider]  B-D ULTRAFINE III SHORT  PEN 31G X 8 MM MISC USE AS DIRECTED 02/20/18   Reed, Tiffany L, DO  baclofen (LIORESAL) 10 MG tablet Take 10 mg by mouth as needed. 08/05/20   [provider]  Blood Glucose Calibration (ACCU-CHEK AVIVA) SOLN Use as Directed. Dx E11.9 05/01/17   Reed, Tiffany L, DO  cephALEXin (KEFLEX) 500 MG capsule Take 1 capsule (500 mg total) by mouth 3 (three) times daily. Take 1 pill by mouth, 3 times a day 07/20/20   Wallene Huh, DPM  diltiazem (CARDIZEM CD) 180 MG 24 hr capsule 180 mg daily.  01/07/19   [provider]  furosemide (LASIX) 20 MG tablet Take 20 mg by mouth every other day.    [provider]  gabapentin (NEURONTIN) 100 MG capsule Take 100 mg by mouth 2 (two) times daily.    [provider]  glucose blood (ACCU-CHEK AVIVA PLUS) test strip DX E11.9, check blood sugar three times daily 04/18/17   Reed, Tiffany L, DO  hydrocortisone 2.5 % ointment Apply topically as needed. 08/05/20   [provider]  Insulin Glargine (LANTUS  SOLOSTAR [place]) Inject into the skin as needed.     [provider]  insulin lispro (HUMALOG KWIKPEN) 100 UNIT/ML KiwkPen Inject into the skin as needed.     [provider]  methocarbamol (ROBAXIN) 750 MG tablet Take by mouth as needed. 08/05/20   [provider]  Multiple Vitamins-Minerals (MULTIVITAMIN PO) Take 1 tablet by mouth daily. Takes a Men's 50 Plus Multivitamin    [provider]  nystatin cream (MYCOSTATIN) Apply 1 application topically as needed. 08/13/20   [provider]  omeprazole (PRILOSEC) 20 MG capsule Take 20 mg by mouth daily.    [provider]  Oxycodone HCl 10 MG TABS Take 1 tablet (10 mg total) by mouth every 8 (eight) hours as needed. For pain 08/15/17   Gildardo Cranker, DO  potassium chloride (KLOR-CON) 20 MEQ packet Take 20 mEq by mouth daily for 7 days. 08/28/20 09/04/20  Auna Mikkelsen A, PA-C  triamcinolone cream (KENALOG) 0.1 % as needed. 08/13/20   [provider]    Allergies    Morphine  Review of Systems   Review of Systems  Constitutional: Negative.   HENT: Positive for trouble swallowing. Negative for congestion, dental problem, drooling, ear discharge, facial swelling, hearing loss, mouth sores, nosebleeds, postnasal drip, rhinorrhea, sinus pressure, sinus pain, sneezing, sore throat, tinnitus and voice change.   Respiratory: Negative.   Cardiovascular: Negative.   Gastrointestinal: Negative.   Genitourinary: Negative.   Musculoskeletal: Negative.   Skin: Negative.   Neurological: Negative.   All other systems reviewed and are negative.   Physical Exam Updated Vital Signs BP (!) 112/92   Pulse 93   Temp 98.7 F (37.1 C)   Resp 15   Ht 5\' 9"  (1.753 m)   Wt 76.2 kg   SpO2 99%   BMI 24.81 kg/m   Physical Exam Vitals and nursing note reviewed.  Constitutional:      General: He is not in acute distress.    Appearance: He is well-developed. He is not ill-appearing, toxic-appearing  or diaphoretic.  HENT:     Head: Normocephalic and atraumatic.     Nose: Nose normal.     Mouth/Throat:     Lips: Pink.     Mouth: Mucous membranes are moist.     Pharynx: Oropharynx is clear. Uvula midline.     Comments: Tongue midline.  No visible  foreign objects.  No pooling of secretions.  Drooling, dysphagia or trismus. Eyes:     Pupils: Pupils are equal, round, and reactive to light.  Neck:     Trachea: Trachea and phonation normal.     Comments: No neck stiffness or neck rigidity Cardiovascular:     Rate and Rhythm: Normal rate and regular rhythm.     Pulses: Normal pulses.     Heart sounds: Normal heart sounds.  Pulmonary:     Effort: Pulmonary effort is normal. No respiratory distress.     Breath sounds: Normal breath sounds.  Abdominal:     General: Bowel sounds are normal. There is no distension.     Palpations: Abdomen is soft.  Musculoskeletal:        General: No swelling, tenderness, deformity or signs of injury. Normal range of motion.     Cervical back: Full passive range of motion without pain, normal range of motion and neck supple.     Right lower leg: No edema.     Left lower leg: No edema.     Comments: Right lower extremity amputation.  Skin:    General: Skin is warm and dry.     Capillary Refill: Capillary refill takes less than 2 seconds.  Neurological:     General: No focal deficit present.     Mental Status: He is alert and oriented to person, place, and time.    ED Results / Procedures / Treatments   Labs (all labs ordered are listed, but only abnormal results are displayed) Labs Reviewed  CBC WITH DIFFERENTIAL/PLATELET - Abnormal; Notable for the following components:      Result Value   Hemoglobin 11.9 (*)    HCT 38.0 (*)    All other components within normal limits  BASIC METABOLIC PANEL - Abnormal; Notable for the following components:   Potassium 2.9 (*)    CO2 21 (*)    Glucose, Bld 135 (*)    Creatinine, Ser 1.58 (*)    GFR,  Estimated 40 (*)    All other components within normal limits  RESPIRATORY PANEL BY RT PCR (FLU A&B, COVID)    EKG None  Radiology No results found.  Procedures Procedures (including critical care time)  Medications Ordered in ED Medications  apixaban (ELIQUIS) tablet 5 mg (has no administration in time range)  glucagon (human recombinant) (GLUCAGEN) injection 1 mg (1 mg Intravenous Given 08/28/20 1733)  diphenhydrAMINE (BENADRYL) injection 25 mg (25 mg Intravenous Given 08/28/20 1708)  metoCLOPramide (REGLAN) injection 5 mg (5 mg Intravenous Given 08/28/20 1711)  sodium chloride 0.9 % bolus 500 mL (0 mLs Intravenous Stopped 08/28/20 1830)  potassium chloride 10 mEq in 100 mL IVPB (0 mEq Intravenous Stopped 08/28/20 1827)  alum & mag hydroxide-simeth (MAALOX/MYLANTA) 200-200-20 MG/5ML suspension 30 mL (30 mLs Oral Given 08/28/20 1826)    And  lidocaine (XYLOCAINE) 2 % viscous mouth solution 15 mL (15 mLs Oral Given 08/28/20 1826)   ED Course  I have reviewed the triage vital signs and the nursing notes.  Pertinent labs & imaging results that were available during my care of the patient were reviewed by me and considered in my medical decision making (see chart for details).  85 year old presents for evaluation of dysphagia.  Began yesterday while he was taking pills.  He is afebrile, nonseptic, not ill-appearing.  No stridor, clear airway.  Heart and lungs clear.  Posterior oropharynx clear.  Patient states he has not been able to tolerate even  liquids at home however he is tolerating his oral secretions.  Has had esophageal stricture approximately 20 years ago however has not had any recurrent problems.  He is not followed by GI however has [facility] PCP.  Plan on labs, Covid test, glucagon, symptomatic management and reassess.  No evidence of allergic reaction or anaphylaxis on exam.  No stridor, clear airway have low suspicion for acute obstruction causing respiratory distress.  Denies sore  throat have low suspicion for PTA, RPA, epiglottitis.  Labs personally reviewed and interpreted:  CBC without leukocytosis Metabolic panel with hypokalemia 2.9 (supplemented IV replacement, has PO replacement at home)  glucose 135, creatinine 1.58 Covid and flu negative  1740: Went to reassess patient as he was given Reglan, Benadryl and glucagon.  Discussed carbonated beverage to see if there is possible food bolus as cause of his symptoms.  We will plan on reassess.  1820: Reassessed.  Was able to drink a whole can of Coke.  He is concerned about not being able to tolerate his pills at home.  We will give him his dose of Eliquis here to ensure he can tolerate this.  I did discuss with him stopping his potassium pills and have written him for a potassium packet which might be more tolerable.  He did have some hypokalemia here which is replaced via IV.  He will need close follow-up with cardiology as his potassium was moderately low.    Care transferred to [facility] [place].  Plan on following up after Eliquis administration.  If unable to tolerate pills plan on consulting GI for possible EGD tonight.  If patient able to tolerate his pills may go home on soft diet and follow-up with GI for outpatient management.  Patient discussed with attending, Dr. Darl Householder who agrees above plan.     MDM Rules/Calculators/A&P                           Final Clinical Impression(s) / ED Diagnoses Final diagnoses:  Dysphagia, unspecified type  Hypokalemia    Rx / DC Orders ED Discharge Orders         Ordered    potassium chloride (KLOR-CON) 20 MEQ packet  Daily        08/28/20 1822           [place], Sayra Frisby A, PA-C 08/28/20 1843    Drenda Freeze, MD 08/28/20 2101

## 2020-08-28 NOTE — Progress Notes (Addendum)
PHARMACY NOTE:   RENAL DOSAGE ADJUSTMENT   Current dosage:  apixaban 5mg  PO BID PTA  Indication: afib   Renal Function: CrCl 35.3ml/min; Scr 1.58   Estimated Creatinine Clearance: 35.4 mL/min (A) (by C-G formula based on SCr of 1.58 mg/dL (H)).   Dosage has been changed to:  apixaban 2.5mg  PO BID  Additional comments: adjusted for renal function and age >83 years old   Thank you for allowing pharmacy to be a part of this patient's care.  Carolin Guernsey, [facility]  PGY1 pharmacy resident 08/28/2020 7:27 PM

## 2020-08-28 NOTE — ED Notes (Signed)
eliquist unable to be given at present time see my comments

## 2020-08-28 NOTE — ED Notes (Signed)
Ptar called 

## 2020-08-29 NOTE — Progress Notes (Signed)
Subjective:   Patient ID: [name], male   DOB: 85 y.o.   MRN: [id number]   HPI Patient concerned about his left big toe that it has pain and is draining   ROS      Objective:  Physical Exam  Very poor circulatory status and patient is lost his right leg who has a chronic draining ulceration left hallux     Assessment:  Ulcer left hallux localized with no proximal edema erythema no odor at the current time     Plan:  Reviewed condition at this point I did clean the area out and applied Medihoney with a 2 given to patient and will continue oral antibiotics with patient understanding that ultimately he may need amputation but we continue to work with this conservatively

## 2020-09-01 ENCOUNTER — Other Ambulatory Visit: Payer: Self-pay | Admitting: Podiatry

## 2020-09-01 NOTE — Telephone Encounter (Signed)
Please advise 

## 2020-09-02 NOTE — Telephone Encounter (Signed)
Fine to refill 

## 2020-09-07 ENCOUNTER — Other Ambulatory Visit: Payer: Medicare Other

## 2020-09-16 ENCOUNTER — Other Ambulatory Visit: Payer: Medicare Other

## 2020-09-18 ENCOUNTER — Ambulatory Visit (INDEPENDENT_AMBULATORY_CARE_PROVIDER_SITE_OTHER): Payer: Medicare Other | Admitting: Student

## 2020-09-18 ENCOUNTER — Encounter: Payer: Self-pay | Admitting: Student

## 2020-09-18 ENCOUNTER — Other Ambulatory Visit: Payer: Medicare Other | Admitting: *Deleted

## 2020-09-18 ENCOUNTER — Other Ambulatory Visit: Payer: Self-pay

## 2020-09-18 VITALS — BP 130/58 | HR 85 | Ht 70.0 in

## 2020-09-18 DIAGNOSIS — I495 Sick sinus syndrome: Secondary | ICD-10-CM

## 2020-09-18 DIAGNOSIS — E876 Hypokalemia: Secondary | ICD-10-CM

## 2020-09-18 DIAGNOSIS — I10 Essential (primary) hypertension: Secondary | ICD-10-CM

## 2020-09-18 DIAGNOSIS — I4891 Unspecified atrial fibrillation: Secondary | ICD-10-CM | POA: Diagnosis not present

## 2020-09-18 DIAGNOSIS — I5032 Chronic diastolic (congestive) heart failure: Secondary | ICD-10-CM

## 2020-09-18 NOTE — Patient Instructions (Signed)
Medication Instructions:  *If you need a refill on your cardiac medications before your next appointment, please call your pharmacy*  Lab Work: Your physician has recommended that you have lab work today: BMET If you have labs (blood work) drawn today and your tests are completely normal, you will receive your results only by: Marland Kitchen MyChart Message (if you have MyChart) OR . A paper copy in the mail If you have any lab test that is abnormal or we need to change your treatment, we will call you to review the results.  Follow-Up: At [facility], you and your health needs are our priority.  As part of our continuing mission to provide you with exceptional heart care, we have created designated Provider Care Teams.  These Care Teams include your primary Cardiologist (physician) and Advanced Practice Providers (APPs -  Physician Assistants and Nurse Practitioners) who all work together to provide you with the care you need, when you need it.  We recommend signing up for the patient portal called "MyChart".  Sign up information is provided on this After Visit Summary.  MyChart is used to connect with patients for Virtual Visits (Telemedicine).  Patients are able to view lab/test results, encounter notes, upcoming appointments, etc.  Non-urgent messages can be sent to your provider as well.   To learn more about what you can do with MyChart, go to [email or web address].    Your next appointment:   Your physician recommends that you keep your scheduled follow-up appointment in April 2022 with Dr. Caryl Comes.  The format for your next appointment:   In Person with Virl Axe, MD

## 2020-09-18 NOTE — Progress Notes (Signed)
Electrophysiology Office Note Date: 09/19/2020  ID:  BANKS [name], DOB September 20, 1935, MRN [id number]  PCP: [name] Carol, MD Primary Cardiologist: No primary care provider on file. Electrophysiologist: Virl Axe, MD   CC: Pacemaker follow-up  [name] is a 85 y.o. male seen today for Virl Axe, MD for post ED follow up.  Since discharge from hospital the patient reports doing very well. He has had no more difficulty swallowing. He has a long history of issues with this. He is tolerate potassium power without difficulty.  he denies chest pain, palpitations, dyspnea, PND, orthopnea, nausea, vomiting, dizziness, syncope, edema, weight gain, or early satiety. He again asks about remote monitoring.  Device History: St. Jude Single Chamber PPM implanted 06/2011 for SSS  Past Medical History:  Diagnosis Date  . Atrial fibrillation ([place])    permanent  . Backache, unspecified   . Cellulitis and abscess of leg, except foot   . CHF (congestive heart failure) ([place])   . Diverticulosis 05-2006   Dr. Deatra Cross   . Esophageal stricture   . Fatigue   . GERD (gastroesophageal reflux disease)   . Gout, unspecified   . Hx of colonic polyps 05-2006   (Adenomatous)Dr. Deatra Cross  . Hx of radiation therapy 06/16/14- 07/18/14   mult squamous cell carcinomas of scalp  . Hypertension   . Long term (current) use of anticoagulants   . Overweight(278.02)   . Pacemaker- [place] 02/20/2013  . Phlebitis and thrombophlebitis of superficial vessels of lower extremities   . Right bundle branch block   . Skin cancer    squamous cell carcinoma scalp  . Syncope and collapse   . Tachycardia-bradycardia ([place])    s/p PPM  . Type II or unspecified type diabetes mellitus without mention of complication, not stated as uncontrolled   . Unspecified venous (peripheral) insufficiency    Past Surgical History:  Procedure Laterality Date  . ABOVE KNEE LEG AMPUTATION Right 04/02/2014   DR DUDA  . AMPUTATION  Right 01/20/2014   Procedure: AMPUTATION BELOW KNEE- right;  Surgeon: [name], MD;  Location: [place];  Service: Orthopedics;  Laterality: Right;  Right Below Knee Amputation  . AMPUTATION Right 02/14/2014   Procedure: AMPUTATION BELOW KNEE;  Surgeon: [name], MD;  Location: [facility];  Service: Orthopedics;  Laterality: Right;  Right Below Knee Amputation Revision  . AMPUTATION Right 03/07/2014   Procedure: AMPUTATION BELOW KNEE;  Surgeon: [name], MD;  Location: [place];  Service: Orthopedics;  Laterality: Right;  Right Below Knee Amputation Revision  . AMPUTATION Right 03/18/2014   Procedure: revision of  AMPUTATION BELOW KNEE;  Surgeon: [name], MD;  Location: [place];  Service: Orthopedics;  Laterality: Right;  Revision Right Below Knee Amputation  . AMPUTATION Right 04/02/2014   Procedure: AMPUTATION ABOVE KNEE;  Surgeon: [name], MD;  Location: [facility];  Service: Orthopedics;  Laterality: Right;  Right Above Knee Amputation  . EYE SURGERY    . I & D EXTREMITY Right 01/17/2014   Procedure: IRRIGATION AND DEBRIDEMENT RIGHT HEEL  WITH CULTURES AND BONE BIOPSY, placement of wound vac;  Surgeon: Theodoro Kos, DO;  Location: [facility];  Service: Plastics;  Laterality: Right;  . LAMINECTOMY    . LUMBAR FUSION    . PACEMAKER INSERTION  8/12   SJM by [name] for tachy/brady syndrome  . ROTATOR CUFF REPAIR    . ruptured rt rectus muscle    . SKIN BIOPSY  scalp    Current Outpatient Medications  Medication Sig Dispense Refill  . ACCU-CHEK SOFTCLIX LANCETS lancets Use to check blood sugar three times daily. Dx E11.9 300 each 3  . apixaban (ELIQUIS) 5 MG TABS tablet Take 5 mg by mouth 2 (two) times daily.     . B-D ULTRAFINE III SHORT PEN 31G X 8 MM MISC USE AS DIRECTED 100 each 11  . Blood Glucose Calibration (ACCU-CHEK AVIVA) SOLN Use as Directed. Dx E11.9 3 each 3  . cephALEXin (KEFLEX) 500 MG capsule TAKE (1) CAPSULE THREE TIMES DAILY. 30 capsule 0  . diltiazem (CARDIZEM CD)  180 MG 24 hr capsule 180 mg daily.     . furosemide (LASIX) 20 MG tablet Take 20 mg by mouth every other day.    . gabapentin (NEURONTIN) 100 MG capsule Take 100 mg by mouth 2 (two) times daily.    Marland Kitchen glucose blood (ACCU-CHEK AVIVA PLUS) test strip DX E11.9, check blood sugar three times daily 300 each 11  . hydrocortisone 2.5 % ointment Apply topically as needed.    . Insulin Glargine (LANTUS SOLOSTAR [place]) Inject into the skin as needed.     . insulin lispro (HUMALOG KWIKPEN) 100 UNIT/ML KiwkPen Inject into the skin as needed.     . methocarbamol (ROBAXIN) 750 MG tablet Take by mouth as needed.    . Multiple Vitamins-Minerals (MULTIVITAMIN PO) Take 1 tablet by mouth daily. Takes a Men's 50 Plus Multivitamin    . nystatin cream (MYCOSTATIN) Apply 1 application topically as needed.    Marland Kitchen omeprazole (PRILOSEC) 20 MG capsule Take 20 mg by mouth daily.    . Oxycodone HCl 10 MG TABS Take 1 tablet (10 mg total) by mouth every 8 (eight) hours as needed. For pain 90 tablet 0  . triamcinolone cream (KENALOG) 0.1 % as needed.    . potassium chloride (KLOR-CON) 20 MEQ packet Take 20 mEq by mouth daily for 7 days. 7 packet 0   No current facility-administered medications for this visit.    Allergies:   Morphine   Social History: Social History   Socioeconomic History  . Marital status: Married    Spouse name: Not on file  . Number of children: 2  . Years of education: Not on file  . Highest education level: Not on file  Occupational History  . Occupation: Retired Hotel manager  Tobacco Use  . Smoking status: Former Research scientist (life sciences)  . Smokeless tobacco: Never Used  . Tobacco comment: Quit at age 70   Vaping Use  . Vaping Use: Never used  Substance and Sexual Activity  . Alcohol use: No    Alcohol/week: 0.0 standard drinks    Comment: none currenlty  . Drug use: No  . Sexual activity: Not Currently  Other Topics Concern  . 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 Difficulty of Paying Living Expenses: Not on file  Food Insecurity:   . Worried About Charity fundraiser in the Last Year: Not on file  . Ran Out of Food in the Last Year: Not on file  Transportation Needs:   . Lack of Transportation (Medical): Not on file  . Lack of Transportation (  Non-Medical): Not on file  Physical Activity:   . Days of Exercise per Week: Not on file  . Minutes of Exercise per Session: Not on file  Stress:   . Feeling of Stress : Not on file  Social Connections:   . Frequency of Communication with Friends and Family: Not on file  . Frequency of Social Gatherings with Friends and Family: Not on file  . Attends Religious Services: Not on file  . Active Member of Clubs or Organizations: Not on file  . Attends Archivist Meetings: Not on file  . Marital Status: Not on file  Intimate Partner Violence:   . Fear of Current or Ex-Partner: Not on file  . Emotionally Abused: Not on file  . Physically Abused: Not on file  . Sexually Abused: Not on file    Family History: Family History  Problem Relation Age of Onset  . Pneumonia Mother   . Heart disease Father   . Diabetes Father   . Colon cancer Neg Hx      Review of Systems: All other systems reviewed and are otherwise negative except as noted above.  Physical Exam: Vitals:   09/18/20 1251  BP: (!) 130/58  Pulse: 85  SpO2: 96%  Height: 5\' 10"  (1.778 m)     GEN- The patient is well appearing, alert and oriented x 3 today.   HEENT: normocephalic, atraumatic; sclera clear, conjunctiva pink; hearing intact; oropharynx clear; neck supple  Lungs- Clear to ausculation bilaterally, normal work of breathing.  No wheezes, rales, rhonchi Heart- Regular rate and rhythm, no murmurs, rubs or  gallops  GI- soft, non-tender, non-distended, bowel sounds present  Extremities- no clubbing or cyanosis. No edema MS- no significant deformity or atrophy Skin- warm and dry, no rash or lesion; PPM pocket well healed Psych- euthymic mood, full affect Neuro- strength and sensation are intact  PPM Interrogation- reviewed in detail today,  See PACEART report  EKG:  EKG is not ordered today.  Recent Labs: 08/28/2020: Hemoglobin 11.9; Platelets 219 09/18/2020: BUN 15; Creatinine, Ser 1.50; Potassium 4.3; Sodium 138   Wt Readings from Last 3 Encounters:  08/28/20 167 lb 15.9 oz (76.2 kg)  12/05/18 168 lb (76.2 kg)  07/10/18 180 lb 9.6 oz (81.9 kg)     Other studies Reviewed: Additional studies/ records that were reviewed today include: Previous EP office notes, Previous remote checks, Most recent labwork.   Assessment and Plan:  1. Sick sinus syndrome s/p St. Jude PPM  Normal PPM function See Pace Art report No changes today Remote compatible. Monitor set up in clinic today. The monitor connected with his device but was able to send out due to poor connectivity in the office, he will plug in and send when he gets home. Given written instructions and CB# if he has any issues.  2. Paroxysmal AF with RVR at times Intermittent VHRs are likely intermittent AF with RVR, previously has not been followed by remotes and it is unclear if he is symptomatic. We will now be able to follow more closely and adjust meds (or alerts) accordingly.  CHA2DS2VASC of at least 7.  On Eliquis.  3. Dysphagia After last visit in setting of taking potassium tablets. This resolved and he is stable on potassium packets.  He has a history of the same, and per ED notes he was given a GI referral.   4. Hypokalemia BMET today.  Current medicines are reviewed at length with the patient today.   The  patient does not have concerns regarding his medicines.  The following changes were made today:  none  Labs/ tests  ordered today include:  Orders Placed This Encounter  Procedures  . CUP PACEART INCLINIC DEVICE CHECK    Disposition:   Follow up with Dr. Caryl Comes in 6 Months. Can likely stretch out further at that visit if doing well, now that he is set up for remotes.    Jacalyn Lefevre, PA-C  09/19/2020 9:06 PM  [place] [phone] [phone] (office) [phone] (fax)

## 2020-09-19 LAB — CUP PACEART INCLINIC DEVICE CHECK
Battery Remaining Longevity: 97 mo
Battery Voltage: 2.92 V
Brady Statistic RV Percent Paced: 20 %
Date Time Interrogation Session: 20211030205740
Implantable Lead Implant Date: 20120817
Implantable Lead Location: 753860
Implantable Lead Model: 1948
Implantable Pulse Generator Implant Date: 20120817
Lead Channel Impedance Value: 437.5 Ohm
Lead Channel Pacing Threshold Amplitude: 1 V
Lead Channel Pacing Threshold Amplitude: 1 V
Lead Channel Pacing Threshold Pulse Width: 0.6 ms
Lead Channel Pacing Threshold Pulse Width: 0.6 ms
Lead Channel Sensing Intrinsic Amplitude: 10 mV
Lead Channel Setting Pacing Amplitude: 2 V
Lead Channel Setting Pacing Pulse Width: 0.6 ms
Lead Channel Setting Sensing Sensitivity: 2 mV
Pulse Gen Model: 1210
Pulse Gen Serial Number: 7244744

## 2020-09-19 LAB — BASIC METABOLIC PANEL
BUN/Creatinine Ratio: 10 (ref 10–24)
BUN: 15 mg/dL (ref 8–27)
CO2: 21 mmol/L (ref 20–29)
Calcium: 9 mg/dL (ref 8.6–10.2)
Chloride: 104 mmol/L (ref 96–106)
Creatinine, Ser: 1.5 mg/dL — ABNORMAL HIGH (ref 0.76–1.27)
GFR calc Af Amer: 49 mL/min/{1.73_m2} — ABNORMAL LOW (ref >59)
GFR calc non Af Amer: 42 mL/min/{1.73_m2} — ABNORMAL LOW (ref >59)
Glucose: 109 mg/dL — ABNORMAL HIGH (ref 65–99)
Potassium: 4.3 mmol/L (ref 3.5–5.2)
Sodium: 138 mmol/L (ref 134–144)

## 2020-09-20 ENCOUNTER — Other Ambulatory Visit: Payer: Self-pay | Admitting: Podiatry

## 2020-09-21 ENCOUNTER — Telehealth: Payer: Self-pay

## 2020-09-21 NOTE — Telephone Encounter (Signed)
-----   Message from Shirley Friar, PA-C sent at 09/19/2020  2:19 PM EDT ----- K has stabilized on potassium packets.  Please have him continue, I believe he only had 7 days prescribed. Thank you!  Legrand Como [address]" [place], PA-C  09/19/2020 2:19 PM

## 2020-09-21 NOTE — Telephone Encounter (Signed)
Lmtcb for lab results 

## 2020-09-23 ENCOUNTER — Telehealth: Payer: Self-pay

## 2020-09-23 NOTE — Telephone Encounter (Signed)
Pt would like a refill on his antibiotics because the area is still draining. Please advise.

## 2020-09-24 ENCOUNTER — Other Ambulatory Visit: Payer: Self-pay | Admitting: Podiatry

## 2020-09-24 MED ORDER — CEPHALEXIN 500 MG PO CAPS: 500.0000 mg | ORAL_CAPSULE | Freq: Three times a day (TID) | ORAL | 1 refills | Status: DC

## 2020-10-26 ENCOUNTER — Telehealth: Payer: Self-pay | Admitting: Podiatry

## 2020-10-26 NOTE — Telephone Encounter (Signed)
Pt called back stating he does not want the shoes or inserts that he did not order. I Did explain to pt that what makes the shoe a diabetic shoe is the custom inserts that conform to his feet and help relieve some of the pressure. He stated he was never told this and the shoes are too small for him and he is not sure he wants a replacement of the shoes. He stated he did see Liliane Channel but was told to make an appt to be refitted for shoes. He said he is 54 and diabetic and does not go out to many appts because of covid. He stated he currently has [employer] shoes and they are getting worn. He is upset that [facility] billing department keeps threatening to send to collections. I explained that when Dr [name] request pt to see [place] for diabetic shoes it includes the inserts. And the inserts are not returnable but the shoes are. He wants to have someone return the shoes and get a receipt that they were returned. He wants a call back today. I told pt I would forward this information to

## 2020-10-26 NOTE — Telephone Encounter (Signed)
Pt left message on billing line stating he is getting aggravated with our billing dept because we are sending threatening lettters to him about the diabetic shoes/inserts he tried to return. He said he has the shoes and inserts but did not want the inserts that we stuck in his shoes. Please call him asap.

## 2020-10-26 NOTE — Telephone Encounter (Signed)
Discussed with Dr [name] and he said it was ok to remove charges.   I have had our billing remove the claims and notified pt and he is to have someone drop off the shoes and inserts. He said thank you and happy holidays to all of [place].

## 2020-11-04 ENCOUNTER — Encounter: Payer: Self-pay | Admitting: Podiatry

## 2020-11-04 ENCOUNTER — Ambulatory Visit: Payer: Medicare Other | Admitting: Podiatry

## 2020-11-04 ENCOUNTER — Other Ambulatory Visit: Payer: Self-pay

## 2020-11-04 DIAGNOSIS — M79675 Pain in left toe(s): Secondary | ICD-10-CM

## 2020-11-04 DIAGNOSIS — L97522 Non-pressure chronic ulcer of other part of left foot with fat layer exposed: Secondary | ICD-10-CM

## 2020-11-04 DIAGNOSIS — E11621 Type 2 diabetes mellitus with foot ulcer: Secondary | ICD-10-CM | POA: Diagnosis not present

## 2020-11-04 DIAGNOSIS — B351 Tinea unguium: Secondary | ICD-10-CM

## 2020-11-06 NOTE — Progress Notes (Signed)
Subjective:   Patient ID: [name], male   DOB: 85 y.o.   MRN: [id number]   HPI Patient presents with abrasions on the outside of his left foot and nail disease of the digits on his left foot.  Patient admits that he wore a tighter shoe and it seemed to occur after that and states he cannot take care of the nails and he has at very high risk as he has had an amputation of his right leg   ROS      Objective:  Physical Exam  Neurovascular status unchanged with thick yellow brittle nails and abrasions with preulcerative lesions on the lateral side of the foot with no erythema edema or drainage associated with it     Assessment:  Abrasion secondary to pressure with nail disease 1-5 left foot     Plan:  H&P educated him on abrasions and shoe gear that he needs to be careful with and went ahead and advised on soaks and just watching these and this should heal uneventfully but if any redness drainage were to occur we will start him on antibiotics.  Debrided nailbeds 1-5 left foot no iatrogenic bleeding was noted

## 2020-12-01 DIAGNOSIS — I480 Paroxysmal atrial fibrillation: Secondary | ICD-10-CM | POA: Diagnosis not present

## 2020-12-01 DIAGNOSIS — I10 Essential (primary) hypertension: Secondary | ICD-10-CM | POA: Diagnosis not present

## 2020-12-01 DIAGNOSIS — N183 Chronic kidney disease, stage 3 unspecified: Secondary | ICD-10-CM | POA: Diagnosis not present

## 2020-12-01 DIAGNOSIS — G8929 Other chronic pain: Secondary | ICD-10-CM | POA: Diagnosis not present

## 2020-12-01 DIAGNOSIS — E11319 Type 2 diabetes mellitus with unspecified diabetic retinopathy without macular edema: Secondary | ICD-10-CM | POA: Diagnosis not present

## 2020-12-01 DIAGNOSIS — E1151 Type 2 diabetes mellitus with diabetic peripheral angiopathy without gangrene: Secondary | ICD-10-CM | POA: Diagnosis not present

## 2020-12-01 DIAGNOSIS — I509 Heart failure, unspecified: Secondary | ICD-10-CM | POA: Diagnosis not present

## 2020-12-01 DIAGNOSIS — E119 Type 2 diabetes mellitus without complications: Secondary | ICD-10-CM | POA: Diagnosis not present

## 2020-12-22 DIAGNOSIS — D6869 Other thrombophilia: Secondary | ICD-10-CM | POA: Diagnosis not present

## 2020-12-22 DIAGNOSIS — I739 Peripheral vascular disease, unspecified: Secondary | ICD-10-CM | POA: Diagnosis not present

## 2020-12-22 DIAGNOSIS — E1151 Type 2 diabetes mellitus with diabetic peripheral angiopathy without gangrene: Secondary | ICD-10-CM | POA: Diagnosis not present

## 2020-12-22 DIAGNOSIS — I480 Paroxysmal atrial fibrillation: Secondary | ICD-10-CM | POA: Diagnosis not present

## 2020-12-22 DIAGNOSIS — E119 Type 2 diabetes mellitus without complications: Secondary | ICD-10-CM | POA: Diagnosis not present

## 2020-12-22 DIAGNOSIS — G894 Chronic pain syndrome: Secondary | ICD-10-CM | POA: Diagnosis not present

## 2020-12-22 DIAGNOSIS — M7989 Other specified soft tissue disorders: Secondary | ICD-10-CM | POA: Diagnosis not present

## 2020-12-22 DIAGNOSIS — I509 Heart failure, unspecified: Secondary | ICD-10-CM | POA: Diagnosis not present

## 2020-12-22 DIAGNOSIS — Z7901 Long term (current) use of anticoagulants: Secondary | ICD-10-CM | POA: Diagnosis not present

## 2020-12-22 DIAGNOSIS — I10 Essential (primary) hypertension: Secondary | ICD-10-CM | POA: Diagnosis not present

## 2020-12-22 DIAGNOSIS — Z0000 Encounter for general adult medical examination without abnormal findings: Secondary | ICD-10-CM | POA: Diagnosis not present

## 2020-12-22 DIAGNOSIS — Z1389 Encounter for screening for other disorder: Secondary | ICD-10-CM | POA: Diagnosis not present

## 2021-01-13 DIAGNOSIS — I480 Paroxysmal atrial fibrillation: Secondary | ICD-10-CM | POA: Diagnosis not present

## 2021-01-13 DIAGNOSIS — E1151 Type 2 diabetes mellitus with diabetic peripheral angiopathy without gangrene: Secondary | ICD-10-CM | POA: Diagnosis not present

## 2021-01-13 DIAGNOSIS — G8929 Other chronic pain: Secondary | ICD-10-CM | POA: Diagnosis not present

## 2021-01-13 DIAGNOSIS — E119 Type 2 diabetes mellitus without complications: Secondary | ICD-10-CM | POA: Diagnosis not present

## 2021-01-13 DIAGNOSIS — E11319 Type 2 diabetes mellitus with unspecified diabetic retinopathy without macular edema: Secondary | ICD-10-CM | POA: Diagnosis not present

## 2021-01-13 DIAGNOSIS — I10 Essential (primary) hypertension: Secondary | ICD-10-CM | POA: Diagnosis not present

## 2021-01-13 DIAGNOSIS — I509 Heart failure, unspecified: Secondary | ICD-10-CM | POA: Diagnosis not present

## 2021-01-13 DIAGNOSIS — N183 Chronic kidney disease, stage 3 unspecified: Secondary | ICD-10-CM | POA: Diagnosis not present

## 2021-02-11 DIAGNOSIS — I480 Paroxysmal atrial fibrillation: Secondary | ICD-10-CM | POA: Diagnosis not present

## 2021-02-11 DIAGNOSIS — I509 Heart failure, unspecified: Secondary | ICD-10-CM | POA: Diagnosis not present

## 2021-02-11 DIAGNOSIS — E11319 Type 2 diabetes mellitus with unspecified diabetic retinopathy without macular edema: Secondary | ICD-10-CM | POA: Diagnosis not present

## 2021-02-11 DIAGNOSIS — E1151 Type 2 diabetes mellitus with diabetic peripheral angiopathy without gangrene: Secondary | ICD-10-CM | POA: Diagnosis not present

## 2021-02-11 DIAGNOSIS — N183 Chronic kidney disease, stage 3 unspecified: Secondary | ICD-10-CM | POA: Diagnosis not present

## 2021-02-11 DIAGNOSIS — G8929 Other chronic pain: Secondary | ICD-10-CM | POA: Diagnosis not present

## 2021-02-11 DIAGNOSIS — I10 Essential (primary) hypertension: Secondary | ICD-10-CM | POA: Diagnosis not present

## 2021-02-23 ENCOUNTER — Encounter: Payer: Medicare Other | Admitting: Internal Medicine

## 2021-02-23 DIAGNOSIS — I4821 Permanent atrial fibrillation: Secondary | ICD-10-CM

## 2021-02-23 DIAGNOSIS — Z950 Presence of cardiac pacemaker: Secondary | ICD-10-CM

## 2021-02-23 NOTE — Progress Notes (Incomplete)
Patient Care Team: Seward Carol, MD as PCP - General (Internal Medicine) Deboraha Sprang, MD as PCP - Electrophysiology (Cardiology) Ricard Dillon, MD as Consulting Physician (Internal Medicine) Deboraha Sprang, MD as Consulting Physician (Cardiology) Thompson Grayer, MD as Consulting Physician (Cardiology) [name], MD as Consulting Physician (Orthopedic Surgery) Wallene Huh, DPM as Consulting Physician (Podiatry)   HPI  [name] is a 86 y.o. male Seen in followup for pacemaker implanted 2012 he has permanent atrial fibrillation with a stroke 2/14.  Anticoagulated with apixaban  DATE TEST EF   4/15 Echo     55-65 %           He is s./p  AKA 2015   Date Cr K Hgb  5/18 1.61  10.1  10/21 1.50 4.3<<2.9 11.9     The patient denies chest pain***, shortness of breath***, nocturnal dyspnea***, orthopnea*** or peripheral edema***.  There have been no palpitations***, lightheadedness*** or syncope***.    Thromboembolic risk factors ( age -30, HTN-1, DM-1, TIA/CVA-2,) for a CHADSVASc Score of 6     Past Medical History:  Diagnosis Date  . Atrial fibrillation ([place])    permanent  . Backache, unspecified   . Cellulitis and abscess of leg, except foot   . CHF (congestive heart failure) ([place])   . Diverticulosis 05-2006   Dr. [name]   . Esophageal stricture   . Fatigue   . GERD (gastroesophageal reflux disease)   . Gout, unspecified   . Hx of colonic polyps 05-2006   (Adenomatous)Dr. [name]  . Hx of radiation therapy 06/16/14- 07/18/14   mult squamous cell carcinomas of scalp  . Hypertension   . Long term (current) use of anticoagulants   . Overweight(278.02)   . Pacemaker- [place] 02/20/2013  . Phlebitis and thrombophlebitis of superficial vessels of lower extremities   . Right bundle branch block   . Skin cancer    squamous cell carcinoma scalp  . Syncope and collapse   . Tachycardia-bradycardia ([place])    s/p PPM  . Type II or unspecified type diabetes  mellitus without mention of complication, not stated as uncontrolled   . Unspecified venous (peripheral) insufficiency     Past Surgical History:  Procedure Laterality Date  . ABOVE KNEE LEG AMPUTATION Right 04/02/2014   DR DUDA  . AMPUTATION Right 01/20/2014   Procedure: AMPUTATION BELOW KNEE- right;  Surgeon: [name], MD;  Location: [facility];  Service: Orthopedics;  Laterality: Right;  Right Below Knee Amputation  . AMPUTATION Right 02/14/2014   Procedure: AMPUTATION BELOW KNEE;  Surgeon: [name], MD;  Location: [facility];  Service: Orthopedics;  Laterality: Right;  Right Below Knee Amputation Revision  . AMPUTATION Right 03/07/2014   Procedure: AMPUTATION BELOW KNEE;  Surgeon: [name], MD;  Location: [facility];  Service: Orthopedics;  Laterality: Right;  Right Below Knee Amputation Revision  . AMPUTATION Right 03/18/2014   Procedure: revision of  AMPUTATION BELOW KNEE;  Surgeon: [name], MD;  Location: [facility];  Service: Orthopedics;  Laterality: Right;  Revision Right Below Knee Amputation  . AMPUTATION Right 04/02/2014   Procedure: AMPUTATION ABOVE KNEE;  Surgeon: [name], MD;  Location: [facility];  Service: Orthopedics;  Laterality: Right;  Right Above Knee Amputation  . EYE SURGERY    . I & D EXTREMITY Right 01/17/2014   Procedure: IRRIGATION AND DEBRIDEMENT RIGHT HEEL  WITH CULTURES AND BONE BIOPSY, placement of wound vac;  Surgeon: Theodoro Kos, DO;  Location: [facility];  Service: Plastics;  Laterality: Right;  . LAMINECTOMY    . LUMBAR FUSION    . PACEMAKER INSERTION  8/12   SJM by [name] for tachy/brady syndrome  . ROTATOR CUFF REPAIR    . ruptured rt rectus muscle    . SKIN BIOPSY     scalp    Current Outpatient Medications  Medication Sig Dispense Refill  . ACCU-CHEK SOFTCLIX LANCETS lancets Use to check blood sugar three times daily. Dx E11.9 300 each 3  . apixaban (ELIQUIS) 5 MG TABS tablet Take 5 mg by mouth 2 (two) times daily.     . B-D ULTRAFINE III SHORT  PEN 31G X 8 MM MISC USE AS DIRECTED 100 each 11  . Blood Glucose Calibration (ACCU-CHEK AVIVA) SOLN Use as Directed. Dx E11.9 3 each 3  . cephALEXin (KEFLEX) 500 MG capsule TAKE (1) CAPSULE THREE TIMES DAILY. 30 capsule 0  . cephALEXin (KEFLEX) 500 MG capsule Take 1 capsule (500 mg total) by mouth 3 (three) times daily. 60 capsule 1  . diltiazem (CARDIZEM CD) 180 MG 24 hr capsule 180 mg daily.     . furosemide (LASIX) 20 MG tablet Take 20 mg by mouth every other day.    . gabapentin (NEURONTIN) 100 MG capsule Take 100 mg by mouth 2 (two) times daily.    Marland Kitchen glucose blood (ACCU-CHEK AVIVA PLUS) test strip DX E11.9, check blood sugar three times daily 300 each 11  . hydrocortisone 2.5 % ointment Apply topically as needed.    . Insulin Glargine (LANTUS SOLOSTAR [place]) Inject into the skin as needed.     . insulin lispro (HUMALOG KWIKPEN) 100 UNIT/ML KiwkPen Inject into the skin as needed.     . methocarbamol (ROBAXIN) 750 MG tablet Take by mouth as needed.    . Multiple Vitamins-Minerals (MULTIVITAMIN PO) Take 1 tablet by mouth daily. Takes a Men's 50 Plus Multivitamin    . nystatin cream (MYCOSTATIN) Apply 1 application topically as needed.    Marland Kitchen omeprazole (PRILOSEC) 20 MG capsule Take 20 mg by mouth daily.    . Oxycodone HCl 10 MG TABS Take 1 tablet (10 mg total) by mouth every 8 (eight) hours as needed. For pain 90 tablet 0  . potassium chloride (KLOR-CON) 20 MEQ packet Take 20 mEq by mouth daily for 7 days. 7 packet 0  . triamcinolone cream (KENALOG) 0.1 % as needed.     No current facility-administered medications for this visit.    Allergies  Allergen Reactions  . Morphine Rash and Other (See Comments)    Irritability also    Review of Systems negative except from HPI and PMH  Physical Exam There were no vitals taken for this visit. Well developed and well nourished in no acute distress HENT normal Neck supple with JVP-flat Clear Device pocket well healed; without hematoma or  erythema.  There is no tethering  Regular rate and rhythm, no *** gallop No ***/*** murmur Abd-soft with active BS No Clubbing cyanosis *** edema Skin-warm and dry A & Oriented  Grossly normal sensory and motor function  ECG ***   Assessment and  Plan  Atrial fib-permanent  Renal insufficiency grade 3    Pacemaker  The patient's device was interrogated.  The information was reviewed. No changes were made in the programming.     On Anticoagulation;  No bleeding issues   Need to check blood work  Had a lengthy discussion Regarding  anticoagulation And the lack of data on effectiveness for once daily apixaban.  I have asked him to look into the cost of rivaroxaban as an alternative.  He will let us know.  He then went on to tell me the story of how his medications are paid for by a charitable fund created [employer]

## 2021-02-25 ENCOUNTER — Other Ambulatory Visit: Payer: Self-pay

## 2021-02-25 ENCOUNTER — Ambulatory Visit (INDEPENDENT_AMBULATORY_CARE_PROVIDER_SITE_OTHER): Payer: Medicare Other | Admitting: Podiatry

## 2021-02-25 ENCOUNTER — Ambulatory Visit: Payer: Medicare Other

## 2021-02-25 DIAGNOSIS — E11621 Type 2 diabetes mellitus with foot ulcer: Secondary | ICD-10-CM | POA: Diagnosis not present

## 2021-02-25 DIAGNOSIS — L97522 Non-pressure chronic ulcer of other part of left foot with fat layer exposed: Secondary | ICD-10-CM

## 2021-02-25 DIAGNOSIS — M79672 Pain in left foot: Secondary | ICD-10-CM

## 2021-02-25 MED ORDER — CEPHALEXIN 500 MG PO CAPS: 500.0000 mg | ORAL_CAPSULE | Freq: Two times a day (BID) | ORAL | 2 refills | Status: DC

## 2021-02-26 NOTE — Progress Notes (Signed)
Subjective:   Patient ID: [name], male   DOB: 86 y.o.   MRN: [id number]   HPI Patient presents concerned with some redness on the outside of his left foot and also he thinks he has got an open wound and has lost his right leg secondary to infection   ROS      Objective:  Physical Exam  Neurovascular tissue with crusted tissue on the lateral side left fifth metatarsal base and slight breakdown of tissue left fifth metatarsal head measuring about 4 x 4 millimeter no subcutaneous exposure mild redness no active drainage or odor noted     Assessment:  Low-grade ulceration left     Plan:  H&P reviewed condition and that he has had these before me been able to work through them and I have recommended the continuation of conservative care and soaks and I did place him on antibiotic cephalexin at the current time.  Patient was given strict instructions if any redness drainage or changes were to occur to let us know immediately and is encouraged to call with questions concerns with grade education given today

## 2021-03-09 DIAGNOSIS — E1151 Type 2 diabetes mellitus with diabetic peripheral angiopathy without gangrene: Secondary | ICD-10-CM | POA: Diagnosis not present

## 2021-03-09 DIAGNOSIS — I509 Heart failure, unspecified: Secondary | ICD-10-CM | POA: Diagnosis not present

## 2021-03-09 DIAGNOSIS — I480 Paroxysmal atrial fibrillation: Secondary | ICD-10-CM | POA: Diagnosis not present

## 2021-03-09 DIAGNOSIS — G8929 Other chronic pain: Secondary | ICD-10-CM | POA: Diagnosis not present

## 2021-03-09 DIAGNOSIS — E119 Type 2 diabetes mellitus without complications: Secondary | ICD-10-CM | POA: Diagnosis not present

## 2021-03-09 DIAGNOSIS — N183 Chronic kidney disease, stage 3 unspecified: Secondary | ICD-10-CM | POA: Diagnosis not present

## 2021-03-09 DIAGNOSIS — I10 Essential (primary) hypertension: Secondary | ICD-10-CM | POA: Diagnosis not present

## 2021-03-09 DIAGNOSIS — E11319 Type 2 diabetes mellitus with unspecified diabetic retinopathy without macular edema: Secondary | ICD-10-CM | POA: Diagnosis not present

## 2021-03-30 ENCOUNTER — Telehealth: Payer: Self-pay | Admitting: Internal Medicine

## 2021-03-30 NOTE — Telephone Encounter (Addendum)
Pt reports he believes he had a TIA last night as he has had TIA's before.  Pt states he was so sleepy he did not make sense per his wife and he wet the bed as well.  Pt has contacted his PCP re: episode and is awaiting a callback. Pt feeling well today without symptoms.  Pt reports he is well overdue for PPM follow up. Reviewed ED precautions.   Appointment scheduled for 04/16/2021 with Oda Kilts, PA-C.  Pt verbalizes understanding and agrees with current plan.

## 2021-03-30 NOTE — Telephone Encounter (Signed)
Patient would like to make Dr. Caryl Comes aware he had a stroke yesterday. He is requesting a call back to discuss further.

## 2021-04-16 ENCOUNTER — Encounter: Payer: Medicare Other | Admitting: Student

## 2021-04-28 ENCOUNTER — Other Ambulatory Visit: Payer: Self-pay

## 2021-04-28 ENCOUNTER — Encounter: Payer: Self-pay | Admitting: Student

## 2021-04-28 ENCOUNTER — Ambulatory Visit: Payer: Medicare Other | Admitting: Student

## 2021-04-28 VITALS — BP 130/68 | HR 88 | Ht 70.0 in | Wt 167.0 lb

## 2021-04-28 DIAGNOSIS — I495 Sick sinus syndrome: Secondary | ICD-10-CM | POA: Diagnosis not present

## 2021-04-28 DIAGNOSIS — I10 Essential (primary) hypertension: Secondary | ICD-10-CM

## 2021-04-28 DIAGNOSIS — E876 Hypokalemia: Secondary | ICD-10-CM

## 2021-04-28 DIAGNOSIS — G459 Transient cerebral ischemic attack, unspecified: Secondary | ICD-10-CM | POA: Diagnosis not present

## 2021-04-28 DIAGNOSIS — I4891 Unspecified atrial fibrillation: Secondary | ICD-10-CM | POA: Diagnosis not present

## 2021-04-28 LAB — CUP PACEART INCLINIC DEVICE CHECK
Battery Remaining Longevity: 98 mo
Battery Voltage: 2.92 V
Brady Statistic RV Percent Paced: 19 %
Date Time Interrogation Session: 20220608135151
Implantable Lead Implant Date: 20120817
Implantable Lead Location: 753860
Implantable Lead Model: 1948
Implantable Pulse Generator Implant Date: 20120817
Lead Channel Impedance Value: 425 Ohm
Lead Channel Pacing Threshold Amplitude: 0.5 V
Lead Channel Pacing Threshold Amplitude: 0.5 V
Lead Channel Pacing Threshold Pulse Width: 0.6 ms
Lead Channel Pacing Threshold Pulse Width: 0.6 ms
Lead Channel Sensing Intrinsic Amplitude: 9.2 mV
Lead Channel Setting Pacing Amplitude: 2 V
Lead Channel Setting Pacing Pulse Width: 0.6 ms
Lead Channel Setting Sensing Sensitivity: 2 mV
Pulse Gen Model: 1210
Pulse Gen Serial Number: 7244744

## 2021-04-28 NOTE — Patient Instructions (Addendum)
Medication Instructions:  Your physician recommends that you continue on your current medications as directed. Please refer to the Current Medication list given to you today.  *If you need a refill on your cardiac medications before your next appointment, please call your pharmacy*   Lab Work: TODAY: BMET, CBC  If you have labs (blood work) drawn today and your tests are completely normal, you will receive your results only by: Marland Kitchen MyChart Message (if you have MyChart) OR . A paper copy in the mail If you have any lab test that is abnormal or we need to change your treatment, we will call you to review the results.   Testing/Procedures: Head CT Scan Non-Cardiac CT scanning, (CAT scanning), is a noninvasive, special x-ray that produces cross-sectional images of the body using x-rays and a computer. CT scans help physicians diagnose and treat medical conditions. For some CT exams, a contrast material is used to enhance visibility in the area of the body being studied. CT scans provide greater clarity and reveal more details than regular x-ray exams.  Head CT Scan Instructions:    1. You may have this done at the [facility], located in the [place] on the 1st floor.       2. [place], [place] [address]        [phone]        Monday - Friday  8:00 am - 5:00 pm   Follow-Up: At [facility], you and your health needs are our priority.  As part of our continuing mission to provide you with exceptional heart care, we have created designated Provider Care Teams.  These Care Teams include your primary Cardiologist (physician) and Advanced Practice Providers (APPs -  Physician Assistants and Nurse Practitioners) who all work together to provide you with the care you need, when you need it.  We recommend signing up for the patient portal called "MyChart".  Sign up information is provided on this After Visit Summary.  MyChart is used to  connect with patients for Virtual Visits (Telemedicine).  Patients are able to view lab/test results, encounter notes, upcoming appointments, etc.  Non-urgent messages can be sent to your provider as well.   To learn more about what you can do with MyChart, go to [email or web address].    Your next appointment:   6 month(s)  The format for your next appointment:   In Person  Provider:   You may see Virl Axe, MD or one of the following Advanced Practice Providers on your designated Care Team:     Tommye Standard, [place] [facility]" [place], [place]

## 2021-04-28 NOTE — Progress Notes (Signed)
Electrophysiology Office Note Date: 04/28/2021  ID:  [name], DOB May 17, 1935, MRN [id number]  PCP: Seward Carol, MD Primary Cardiologist: None Electrophysiologist: [name] Axe, MD   CC: Pacemaker follow-up  [name] is a 86 y.o. male seen today for [name] Axe, MD for routine electrophysiology followup.   Since last being seen in our clinic the patient reports an episode that was concerning for TIA symptoms. He states he was in bed around 0200 03/29/21, became confused and "talking non-sense" and lost bladder control.  He states these symptoms improved over 10-14 hours, sleep time included. He states he was previously told when you have a mini-stroke you should try and "work your brain" as soon as possible, so he did word puzzles. He called his PCP as well and states he has still not heard back from their office as of today. He states despite a history of TIA he has not seen a neurologist.   He has no lasting deficit from this event.    he denies chest pain, palpitations, dyspnea, PND, orthopnea, nausea, vomiting, dizziness, syncope, edema, weight gain, or early satiety.  Device History: St. Jude Single Chamber PPM implanted 06/2011 for SSS  Past Medical History:  Diagnosis Date  . Atrial fibrillation ([place])    permanent  . Backache, unspecified   . Cellulitis and abscess of leg, except foot   . CHF (congestive heart failure) ([place])   . Diverticulosis 05-2006   Dr. Deatra Cross   . Esophageal stricture   . Fatigue   . GERD (gastroesophageal reflux disease)   . Gout, unspecified   . Hx of colonic polyps 05-2006   (Adenomatous)Dr. Deatra Cross  . Hx of radiation therapy 06/16/14- 07/18/14   mult squamous cell carcinomas of scalp  . Hypertension   . Long term (current) use of anticoagulants   . Overweight(278.02)   . Pacemaker- Oklee 02/20/2013  . Phlebitis and thrombophlebitis of superficial vessels of lower extremities   . Right bundle branch block   . Skin cancer     squamous cell carcinoma scalp  . Syncope and collapse   . 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 Type II or unspecified type diabetes mellitus without mention of complication, not stated as uncontrolled   . Unspecified venous (peripheral) insufficiency    Past Surgical History:  Procedure Laterality Date  . ABOVE KNEE LEG AMPUTATION Right 04/02/2014   DR DUDA  . AMPUTATION Right 01/20/2014   Procedure: AMPUTATION BELOW KNEE- right;  Surgeon: [name], MD;  Location: [place];  Service: Orthopedics;  Laterality: Right;  Right Below Knee Amputation  . AMPUTATION Right 02/14/2014   Procedure: AMPUTATION BELOW KNEE;  Surgeon: [name], MD;  Location: [place];  Service: Orthopedics;  Laterality: Right;  Right Below Knee Amputation Revision  . AMPUTATION Right 03/07/2014   Procedure: AMPUTATION BELOW KNEE;  Surgeon: [name], MD;  Location: [place];  Service: Orthopedics;  Laterality: Right;  Right Below Knee Amputation Revision  . AMPUTATION Right 03/18/2014   Procedure: revision of  AMPUTATION BELOW KNEE;  Surgeon: [name], MD;  Location: [place];  Service: Orthopedics;  Laterality: Right;  Revision Right Below Knee Amputation  . AMPUTATION Right 04/02/2014   Procedure: AMPUTATION ABOVE KNEE;  Surgeon: [name], MD;  Location: [place];  Service: Orthopedics;  Laterality: Right;  Right Above Knee Amputation  . EYE SURGERY    . I & D EXTREMITY Right 01/17/2014  Procedure: IRRIGATION AND DEBRIDEMENT RIGHT HEEL  WITH CULTURES AND BONE BIOPSY, placement of wound vac;  Surgeon: [name] Kos, DO;  Location: [place];  Service: Plastics;  Laterality: Right;  . LAMINECTOMY    . LUMBAR FUSION    . PACEMAKER INSERTION  8/12   SJM by Dr [name] for tachy/brady syndrome  . ROTATOR CUFF REPAIR    . ruptured rt rectus muscle    . SKIN BIOPSY     scalp    Current Outpatient Medications  Medication Sig Dispense Refill  . ACCU-CHEK SOFTCLIX LANCETS lancets Use to check blood sugar three times  daily. Dx E11.9 300 each 3  . apixaban (ELIQUIS) 5 MG TABS tablet Take 5 mg by mouth 2 (two) times daily.     . B-D ULTRAFINE III SHORT PEN 31G X 8 MM MISC USE AS DIRECTED 100 each 11  . Blood Glucose Calibration (ACCU-CHEK AVIVA) SOLN Use as Directed. Dx E11.9 3 each 3  . cephALEXin (KEFLEX) 500 MG capsule TAKE (1) CAPSULE THREE TIMES DAILY. 30 capsule 0  . cephALEXin (KEFLEX) 500 MG capsule Take 1 capsule (500 mg total) by mouth 3 (three) times daily. 60 capsule 1  . cephALEXin (KEFLEX) 500 MG capsule Take 1 capsule (500 mg total) by mouth 2 (two) times daily. 40 capsule 2  . diltiazem (CARDIZEM CD) 180 MG 24 hr capsule 180 mg daily.     . furosemide (LASIX) 20 MG tablet Take 20 mg by mouth every other day.    . gabapentin (NEURONTIN) 100 MG capsule Take 100 mg by mouth 2 (two) times daily.    Marland Kitchen glucose blood (ACCU-CHEK AVIVA PLUS) test strip DX E11.9, check blood sugar three times daily 300 each 11  . hydrocortisone 2.5 % ointment Apply topically as needed.    . Insulin Glargine (LANTUS SOLOSTAR [place]) Inject into the skin as needed.     . insulin lispro (HUMALOG) 100 UNIT/ML KiwkPen Inject into the skin as needed.     . melatonin 5 MG TABS at bedtime.    . Multiple Vitamins-Minerals (MULTIVITAMIN PO) Take 1 tablet by mouth daily. Takes a Men's 50 Plus Multivitamin    . omeprazole (PRILOSEC) 20 MG capsule Take 20 mg by mouth daily.    . Oxycodone HCl 10 MG TABS Take 1 tablet (10 mg total) by mouth every 8 (eight) hours as needed. For pain 90 tablet 0   No current facility-administered medications for this visit.    Allergies:   Morphine   Social History: Social History   Socioeconomic History  . Marital status: Married    Spouse name: Not on file  . Number of children: 2  . Years of education: Not on file  . Highest education level: Not on file  Occupational History  . Occupation: Retired Hotel manager  Tobacco Use  . Smoking status: Former Research scientist (life sciences)  . Smokeless tobacco: Never Used  .  Tobacco comment: Quit at age 56   Vaping Use  . Vaping Use: Never used  Substance and Sexual Activity  . Alcohol use: No    Alcohol/week: 0.0 standard drinks    Comment: none currenlty  . Drug use: No  . Sexual activity: Not Currently  Other Topics Concern  . 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 Pneumonia Mother   . Heart disease Father   . Diabetes Father   . Colon cancer Neg Hx      Review of Systems: All other systems reviewed and are otherwise negative except as noted above.  Physical Exam: Vitals:   04/28/21 1333  BP: 130/68  Pulse: 88  SpO2: 99%  Weight: 167 lb (75.8 kg)  Height: 5\' 10"  (1.778 m)     GEN- The patient is well appearing, alert and oriented x 3 today.   HEENT: normocephalic, atraumatic; sclera clear, conjunctiva pink; hearing intact; oropharynx clear; neck supple  Lungs- Clear to ausculation bilaterally, normal work of breathing.  No wheezes, rales, rhonchi Heart- Regular rate and rhythm, no murmurs, rubs or gallops  GI- soft, non-tender, non-distended, bowel sounds present  Extremities- no clubbing or cyanosis. No edema MS- no significant deformity or atrophy Skin- warm and dry, no rash or lesion; PPM pocket well healed Psych- euthymic mood, full affect Neuro- strength and sensation are intact  PPM Interrogation- reviewed in detail today,  See PACEART report  EKG:  EKG is ordered today. Personal review shows AF at 88  bpm   Recent Labs: 08/28/2020: Hemoglobin 11.9; Platelets 219 09/18/2020: BUN 15; Creatinine, Ser 1.50; Potassium 4.3; Sodium 138   Wt Readings from Last 3 Encounters:  04/28/21 167 lb (75.8 kg)  08/28/20 167 lb 15.9 oz (76.2 kg)  12/05/18 168 lb (76.2 kg)     Other studies Reviewed: Additional studies/ records that were reviewed today include: Previous EP office notes, Previous remote checks, Most recent labwork.   Assessment and Plan:  1. Sick sinus syndrome s/p St. Jude PPM  Normal PPM function. Occasional rate excursion but overall histograms stable. 1 noise reversion 04/20/2021 he does not remember any surrounding circumstances. Not reproducible today. See Pace Art report No changes today  2. Paroxysmal AF  Rapid rates at times On eliquis for CHA2DS2VASC of at least 7.    3. Dysphagia, pills Does not tolerate potassium tablets. He states he switched his vitamins to gummies and has had no further issues.  4. Hypokalemia Labs today.  5. TIA like symptoms Had an unclear time period of confusion including loss of bladder control 03/29/2021. He has a h/o TIA. He did not seek care. Refer to neurology Update Head CT Reviewed recommendations for CVA treatment are on a strict timeline, and any concerns that he is having further stroke like symptoms should result in calling 911 as soon as possible.   Current medicines are reviewed at length with the patient today.   The patient does not have concerns regarding his medicines.  The following changes were made today:  none  Labs/ tests ordered today include:  Orders Placed This Encounter  Procedures  . CT Head Wo Contrast  . CBC  . Basic metabolic panel  . Ambulatory referral to Neurology  . EKG 12-Lead     Disposition:   Follow up with Dr. Caryl Comes in 6 Months. Will have device clinic reach out to help with remotes.    Jacalyn Lefevre, PA-C  04/28/2021 2:03 PM  [place] [address] [phone] (office) [phone] (fax)

## 2021-04-29 ENCOUNTER — Telehealth: Payer: Self-pay

## 2021-04-29 LAB — BASIC METABOLIC PANEL
BUN/Creatinine Ratio: 10 (ref 10–24)
BUN: 14 mg/dL (ref 8–27)
CO2: 23 mmol/L (ref 20–29)
Calcium: 8.7 mg/dL (ref 8.6–10.2)
Chloride: 104 mmol/L (ref 96–106)
Creatinine, Ser: 1.44 mg/dL — ABNORMAL HIGH (ref 0.76–1.27)
Glucose: 180 mg/dL — ABNORMAL HIGH (ref 65–99)
Potassium: 3.8 mmol/L (ref 3.5–5.2)
Sodium: 140 mmol/L (ref 134–144)
eGFR: 48 mL/min/{1.73_m2} — ABNORMAL LOW (ref >59)

## 2021-04-29 LAB — CBC
Hematocrit: 32.5 % — ABNORMAL LOW (ref 37.5–51.0)
Hemoglobin: 10.7 g/dL — ABNORMAL LOW (ref 13.0–17.7)
MCH: 27.2 pg (ref 26.6–33.0)
MCHC: 32.9 g/dL (ref 31.5–35.7)
MCV: 83 fL (ref 79–97)
Platelets: 211 10*3/uL (ref 150–450)
RBC: 3.93 x10E6/uL — ABNORMAL LOW (ref 4.14–5.80)
RDW: 13.6 % (ref 11.6–15.4)
WBC: 6.5 10*3/uL (ref 3.4–10.8)

## 2021-04-29 NOTE — Telephone Encounter (Signed)
-----   Message from Shirley Friar, PA-C sent at 04/28/2021  2:11 PM EDT ----- Please call this patient tomorrow or early next week to give him time to try and send a manual transmission.   His device IS RF compatible and we reviewed sending a transmission today.   He had it unplugged at home.   Legrand Como [address]" [place], PA-C  04/28/2021 2:12 PM

## 2021-04-29 NOTE — Telephone Encounter (Signed)
Spoke with pt.  He states he has not plugged in monitor yet as he would like to review the information on the monitor before he does.  He states he plans to do this early next week and will call our office when he is ready.  Stressed to patient we aren't able to monitor if his PPM is working effectively if monitor is not plugged in.  Advised if a transmission has not been receved in a week we will follow-up with him.

## 2021-05-07 ENCOUNTER — Inpatient Hospital Stay: Admission: RE | Admit: 2021-05-07 | Payer: Medicare Other | Source: Ambulatory Visit

## 2021-05-10 ENCOUNTER — Telehealth: Payer: Self-pay | Admitting: *Deleted

## 2021-05-10 NOTE — Telephone Encounter (Signed)
Patient is calling and has a spot on his foot that is leaking badly, has about 4 -5 more days of antibiotic remaining.Please schedule F/ u appointment as soon as possible.

## 2021-05-12 ENCOUNTER — Telehealth: Payer: Self-pay | Admitting: Podiatry

## 2021-05-17 ENCOUNTER — Telehealth: Payer: Self-pay | Admitting: Neurology

## 2021-05-17 ENCOUNTER — Ambulatory Visit: Payer: Medicare Other | Admitting: Podiatry

## 2021-05-17 DIAGNOSIS — I509 Heart failure, unspecified: Secondary | ICD-10-CM | POA: Diagnosis not present

## 2021-05-17 DIAGNOSIS — G8929 Other chronic pain: Secondary | ICD-10-CM | POA: Diagnosis not present

## 2021-05-17 DIAGNOSIS — N183 Chronic kidney disease, stage 3 unspecified: Secondary | ICD-10-CM | POA: Diagnosis not present

## 2021-05-17 DIAGNOSIS — E119 Type 2 diabetes mellitus without complications: Secondary | ICD-10-CM | POA: Diagnosis not present

## 2021-05-17 DIAGNOSIS — I480 Paroxysmal atrial fibrillation: Secondary | ICD-10-CM | POA: Diagnosis not present

## 2021-05-17 DIAGNOSIS — E1151 Type 2 diabetes mellitus with diabetic peripheral angiopathy without gangrene: Secondary | ICD-10-CM | POA: Diagnosis not present

## 2021-05-17 DIAGNOSIS — I10 Essential (primary) hypertension: Secondary | ICD-10-CM | POA: Diagnosis not present

## 2021-05-17 DIAGNOSIS — E11319 Type 2 diabetes mellitus with unspecified diabetic retinopathy without macular edema: Secondary | ICD-10-CM | POA: Diagnosis not present

## 2021-05-17 NOTE — Telephone Encounter (Signed)
  Since last being seen in our clinic the patient reports an episode that was concerning for TIA symptoms. He states he was in bed around 0200 03/29/21, became confused and "talking non-sense" and lost bladder control.  He states these symptoms improved over 10-14 hours, sleep time included. He states he was previously told when you have a mini-stroke you should try and "work your brain" as soon as possible, so he did word puzzles. He called his PCP as well and states he has still not heard back from their office as of today. He states despite a history of TIA he has not seen a neurologist.   He has no lasting deficit from this event.  From referring, please schedule him within 1-2 weeks.

## 2021-05-18 NOTE — Telephone Encounter (Signed)
The patient states he had a lot of strange things happening in his family for the past couple of weeks. He states he needs to plug it in and will plug the monitor in next week.

## 2021-05-19 ENCOUNTER — Ambulatory Visit: Payer: Medicare Other | Admitting: Podiatry

## 2021-05-27 ENCOUNTER — Other Ambulatory Visit: Payer: Self-pay

## 2021-05-27 ENCOUNTER — Ambulatory Visit: Payer: Medicare Other | Admitting: Podiatry

## 2021-05-27 DIAGNOSIS — L97522 Non-pressure chronic ulcer of other part of left foot with fat layer exposed: Secondary | ICD-10-CM | POA: Diagnosis not present

## 2021-05-27 MED ORDER — CEPHALEXIN 500 MG PO CAPS: 500.0000 mg | ORAL_CAPSULE | Freq: Three times a day (TID) | ORAL | 2 refills | Status: DC

## 2021-05-28 NOTE — Telephone Encounter (Signed)
Letter sent 05/28/2021

## 2021-06-08 NOTE — Progress Notes (Signed)
Subjective:   Patient ID: [name], male   DOB: 86 y.o.   MRN: [id number]   HPI Patient points to the outside of the left foot stating that there has been some slight drainage and he thinks he needs a new antibiotic   ROS      Objective:  Physical Exam  Neurovascular status unchanged history of amputation right and has not had amputation left with periodic infections that occur.  The left lateral foot shows an approximate 1 cm length by half centimeter with superficial opening no subcutaneous or bone tendon exposure no proximal edema erythema drainage noted     Assessment:  Low-grade localized infection which has been present for fairly long time kept under good control with conservative treatment     Plan:  Debridement of tissue flushed the wound and applied Iodosorb sterile dressing advised on home wet-to-dry dressings and placed back on cephalexin 500 mg 3 times daily.  Gave strict instructions if any further redness or increase in drainage or size of this were to occur to reappoint immediately if not it seems to stay under relatively good control control with open toed shoes

## 2021-06-22 DIAGNOSIS — L97522 Non-pressure chronic ulcer of other part of left foot with fat layer exposed: Secondary | ICD-10-CM | POA: Diagnosis not present

## 2021-06-22 DIAGNOSIS — E1151 Type 2 diabetes mellitus with diabetic peripheral angiopathy without gangrene: Secondary | ICD-10-CM | POA: Diagnosis not present

## 2021-06-22 DIAGNOSIS — D6869 Other thrombophilia: Secondary | ICD-10-CM | POA: Diagnosis not present

## 2021-06-22 DIAGNOSIS — Z7901 Long term (current) use of anticoagulants: Secondary | ICD-10-CM | POA: Diagnosis not present

## 2021-06-22 DIAGNOSIS — E11319 Type 2 diabetes mellitus with unspecified diabetic retinopathy without macular edema: Secondary | ICD-10-CM | POA: Diagnosis not present

## 2021-06-22 DIAGNOSIS — G894 Chronic pain syndrome: Secondary | ICD-10-CM | POA: Diagnosis not present

## 2021-06-22 DIAGNOSIS — I739 Peripheral vascular disease, unspecified: Secondary | ICD-10-CM | POA: Diagnosis not present

## 2021-06-22 DIAGNOSIS — I10 Essential (primary) hypertension: Secondary | ICD-10-CM | POA: Diagnosis not present

## 2021-07-07 DIAGNOSIS — D649 Anemia, unspecified: Secondary | ICD-10-CM | POA: Diagnosis not present

## 2021-07-08 DIAGNOSIS — H35373 Puckering of macula, bilateral: Secondary | ICD-10-CM | POA: Diagnosis not present

## 2021-07-08 DIAGNOSIS — H52203 Unspecified astigmatism, bilateral: Secondary | ICD-10-CM | POA: Diagnosis not present

## 2021-07-08 DIAGNOSIS — H353131 Nonexudative age-related macular degeneration, bilateral, early dry stage: Secondary | ICD-10-CM | POA: Diagnosis not present

## 2021-07-08 DIAGNOSIS — E119 Type 2 diabetes mellitus without complications: Secondary | ICD-10-CM | POA: Diagnosis not present

## 2021-07-09 DIAGNOSIS — D649 Anemia, unspecified: Secondary | ICD-10-CM | POA: Diagnosis not present

## 2021-07-22 ENCOUNTER — Other Ambulatory Visit: Payer: Self-pay

## 2021-07-22 ENCOUNTER — Ambulatory Visit (INDEPENDENT_AMBULATORY_CARE_PROVIDER_SITE_OTHER)
Admission: RE | Admit: 2021-07-22 | Discharge: 2021-07-22 | Disposition: A | Discharge status: Home / Self Care | Payer: Medicare Other | Source: Ambulatory Visit | Attending: Student | Admitting: Student

## 2021-07-22 DIAGNOSIS — G459 Transient cerebral ischemic attack, unspecified: Secondary | ICD-10-CM | POA: Diagnosis not present

## 2021-07-22 DIAGNOSIS — R410 Disorientation, unspecified: Secondary | ICD-10-CM | POA: Diagnosis not present

## 2021-07-30 ENCOUNTER — Telehealth: Payer: Self-pay | Admitting: Student

## 2021-07-30 NOTE — Telephone Encounter (Signed)
Pt is wanting to know results for his ct scan please call and advise, not able to access mychart.

## 2021-07-30 NOTE — Telephone Encounter (Signed)
The patient has been notified of the result of his CT scan and verbalized understanding.  All questions (if any) were answered. Gatlyn Lipari, [facility] 07/30/2021 3:49 PM   Copy of report mailed to pt per his request.

## 2021-08-10 DIAGNOSIS — E119 Type 2 diabetes mellitus without complications: Secondary | ICD-10-CM | POA: Diagnosis not present

## 2021-08-10 DIAGNOSIS — I480 Paroxysmal atrial fibrillation: Secondary | ICD-10-CM | POA: Diagnosis not present

## 2021-08-10 DIAGNOSIS — E11319 Type 2 diabetes mellitus with unspecified diabetic retinopathy without macular edema: Secondary | ICD-10-CM | POA: Diagnosis not present

## 2021-08-10 DIAGNOSIS — I10 Essential (primary) hypertension: Secondary | ICD-10-CM | POA: Diagnosis not present

## 2021-08-10 DIAGNOSIS — N183 Chronic kidney disease, stage 3 unspecified: Secondary | ICD-10-CM | POA: Diagnosis not present

## 2021-08-10 DIAGNOSIS — E1151 Type 2 diabetes mellitus with diabetic peripheral angiopathy without gangrene: Secondary | ICD-10-CM | POA: Diagnosis not present

## 2021-08-10 DIAGNOSIS — I509 Heart failure, unspecified: Secondary | ICD-10-CM | POA: Diagnosis not present

## 2021-08-10 DIAGNOSIS — G8929 Other chronic pain: Secondary | ICD-10-CM | POA: Diagnosis not present

## 2021-08-12 ENCOUNTER — Ambulatory Visit: Payer: Medicare Other | Admitting: Neurology

## 2021-10-16 DIAGNOSIS — E1151 Type 2 diabetes mellitus with diabetic peripheral angiopathy without gangrene: Secondary | ICD-10-CM | POA: Diagnosis not present

## 2021-10-16 DIAGNOSIS — I509 Heart failure, unspecified: Secondary | ICD-10-CM | POA: Diagnosis not present

## 2021-10-16 DIAGNOSIS — I480 Paroxysmal atrial fibrillation: Secondary | ICD-10-CM | POA: Diagnosis not present

## 2021-10-16 DIAGNOSIS — G8929 Other chronic pain: Secondary | ICD-10-CM | POA: Diagnosis not present

## 2021-10-16 DIAGNOSIS — I10 Essential (primary) hypertension: Secondary | ICD-10-CM | POA: Diagnosis not present

## 2021-10-16 DIAGNOSIS — E11319 Type 2 diabetes mellitus with unspecified diabetic retinopathy without macular edema: Secondary | ICD-10-CM | POA: Diagnosis not present

## 2021-10-16 DIAGNOSIS — N183 Chronic kidney disease, stage 3 unspecified: Secondary | ICD-10-CM | POA: Diagnosis not present

## 2021-10-20 ENCOUNTER — Telehealth: Payer: Self-pay | Admitting: Student

## 2021-10-20 NOTE — Telephone Encounter (Signed)
Patient would like to speak to Joesph July about a brain scan he had done about two months ago.

## 2021-10-20 NOTE — Telephone Encounter (Signed)
Called patient. He was under impression with re-assuring results he could/ or should, cancel neurology follow up.  With previous symptoms concerning for TIA, recommended he keep neurology appointment for further recommendations. He agreed and thanked me for calling. No further questions at this time.   Legrand Como [address]" [place], PA-C  10/20/2021 4:27 PM

## 2021-10-25 ENCOUNTER — Encounter: Payer: Self-pay | Admitting: Neurology

## 2021-10-25 ENCOUNTER — Ambulatory Visit: Payer: Medicare Other | Admitting: Neurology

## 2022-01-21 DIAGNOSIS — D6869 Other thrombophilia: Secondary | ICD-10-CM | POA: Diagnosis not present

## 2022-01-21 DIAGNOSIS — Z0000 Encounter for general adult medical examination without abnormal findings: Secondary | ICD-10-CM | POA: Diagnosis not present

## 2022-01-21 DIAGNOSIS — E1151 Type 2 diabetes mellitus with diabetic peripheral angiopathy without gangrene: Secondary | ICD-10-CM | POA: Diagnosis not present

## 2022-01-21 DIAGNOSIS — E7800 Pure hypercholesterolemia, unspecified: Secondary | ICD-10-CM | POA: Diagnosis not present

## 2022-01-21 DIAGNOSIS — I480 Paroxysmal atrial fibrillation: Secondary | ICD-10-CM | POA: Diagnosis not present

## 2022-01-21 DIAGNOSIS — I739 Peripheral vascular disease, unspecified: Secondary | ICD-10-CM | POA: Diagnosis not present

## 2022-01-21 DIAGNOSIS — G894 Chronic pain syndrome: Secondary | ICD-10-CM | POA: Diagnosis not present

## 2022-01-21 DIAGNOSIS — I10 Essential (primary) hypertension: Secondary | ICD-10-CM | POA: Diagnosis not present

## 2022-01-21 DIAGNOSIS — E113599 Type 2 diabetes mellitus with proliferative diabetic retinopathy without macular edema, unspecified eye: Secondary | ICD-10-CM | POA: Diagnosis not present

## 2022-01-21 DIAGNOSIS — I509 Heart failure, unspecified: Secondary | ICD-10-CM | POA: Diagnosis not present

## 2022-01-21 DIAGNOSIS — S78111A Complete traumatic amputation at level between right hip and knee, initial encounter: Secondary | ICD-10-CM | POA: Diagnosis not present

## 2022-01-21 DIAGNOSIS — E11319 Type 2 diabetes mellitus with unspecified diabetic retinopathy without macular edema: Secondary | ICD-10-CM | POA: Diagnosis not present

## 2022-01-21 DIAGNOSIS — Z1389 Encounter for screening for other disorder: Secondary | ICD-10-CM | POA: Diagnosis not present

## 2022-01-21 DIAGNOSIS — Z7901 Long term (current) use of anticoagulants: Secondary | ICD-10-CM | POA: Diagnosis not present

## 2022-01-27 ENCOUNTER — Ambulatory Visit: Payer: Medicare Other | Admitting: Podiatry

## 2022-02-03 ENCOUNTER — Other Ambulatory Visit: Payer: Self-pay

## 2022-02-03 ENCOUNTER — Ambulatory Visit: Payer: Medicare Other | Admitting: Podiatry

## 2022-02-03 ENCOUNTER — Encounter: Payer: Self-pay | Admitting: Podiatry

## 2022-02-03 DIAGNOSIS — L97522 Non-pressure chronic ulcer of other part of left foot with fat layer exposed: Secondary | ICD-10-CM

## 2022-02-03 DIAGNOSIS — E11621 Type 2 diabetes mellitus with foot ulcer: Secondary | ICD-10-CM

## 2022-02-04 NOTE — Progress Notes (Signed)
Subjective:  ? ?Patient ID: [name], male   DOB: 87 y.o.   MRN: [id number]  ? ?HPI ?Patient presents stating he has been doing a pretty good job with his left foot but he has developed more thickness around the left big toe and 2 spots on the outside that have occurred.  Does not have significant redness currently and did lose his right leg secondary to infection a number of years ago ? ? ?ROS ? ? ?   ?Objective:  ?Physical Exam  ?No change neurovascular status left with patient found to have thickened tissue on the left hallux and crusted tissue on the outside fifth metatarsal head fifth metatarsal base with no erythema edema associated with it currently ? ?   ?Assessment:  ?Crusted tissue with at this point a relatively stable condition with lesion formation left hallux and left fifth metatarsal left fifth metatarsal base  ? ?   ?Plan:  ?Reviewed with him the continuation of daily evaluations of condition and went ahead today and debrided tissue from the left hallux and did not find any ulceration breakdown currently.  Did educate him again at great length on the importance of what he does and to continue to wear proper shoe gear.  Patient is stable currently which I am very happy about ?   ? ? ?

## 2022-03-04 ENCOUNTER — Other Ambulatory Visit: Payer: Self-pay | Admitting: Internal Medicine

## 2022-03-04 DIAGNOSIS — F4489 Other dissociative and conversion disorders: Secondary | ICD-10-CM

## 2022-03-16 DIAGNOSIS — D485 Neoplasm of uncertain behavior of skin: Secondary | ICD-10-CM | POA: Diagnosis not present

## 2022-03-16 DIAGNOSIS — C44229 Squamous cell carcinoma of skin of left ear and external auricular canal: Secondary | ICD-10-CM | POA: Diagnosis not present

## 2022-03-16 DIAGNOSIS — D0439 Carcinoma in situ of skin of other parts of face: Secondary | ICD-10-CM | POA: Diagnosis not present

## 2022-03-16 DIAGNOSIS — C4442 Squamous cell carcinoma of skin of scalp and neck: Secondary | ICD-10-CM | POA: Diagnosis not present

## 2022-03-16 DIAGNOSIS — D044 Carcinoma in situ of skin of scalp and neck: Secondary | ICD-10-CM | POA: Diagnosis not present

## 2022-03-16 DIAGNOSIS — C44222 Squamous cell carcinoma of skin of right ear and external auricular canal: Secondary | ICD-10-CM | POA: Diagnosis not present

## 2022-03-17 ENCOUNTER — Ambulatory Visit: Payer: Medicare Other | Admitting: Neurology

## 2022-03-18 DIAGNOSIS — R410 Disorientation, unspecified: Secondary | ICD-10-CM | POA: Diagnosis not present

## 2022-03-18 DIAGNOSIS — I6523 Occlusion and stenosis of bilateral carotid arteries: Secondary | ICD-10-CM | POA: Diagnosis not present

## 2022-03-31 ENCOUNTER — Other Ambulatory Visit: Payer: Medicare Other

## 2022-04-20 DIAGNOSIS — G8929 Other chronic pain: Secondary | ICD-10-CM | POA: Diagnosis not present

## 2022-04-20 DIAGNOSIS — E11319 Type 2 diabetes mellitus with unspecified diabetic retinopathy without macular edema: Secondary | ICD-10-CM | POA: Diagnosis not present

## 2022-04-20 DIAGNOSIS — I480 Paroxysmal atrial fibrillation: Secondary | ICD-10-CM | POA: Diagnosis not present

## 2022-04-20 DIAGNOSIS — N1832 Chronic kidney disease, stage 3b: Secondary | ICD-10-CM | POA: Diagnosis not present

## 2022-04-20 DIAGNOSIS — I509 Heart failure, unspecified: Secondary | ICD-10-CM | POA: Diagnosis not present

## 2022-04-20 DIAGNOSIS — I10 Essential (primary) hypertension: Secondary | ICD-10-CM | POA: Diagnosis not present

## 2022-04-25 ENCOUNTER — Other Ambulatory Visit: Payer: Medicare Other

## 2022-05-05 ENCOUNTER — Ambulatory Visit: Payer: Medicare Other | Admitting: Neurology

## 2022-05-31 DIAGNOSIS — L570 Actinic keratosis: Secondary | ICD-10-CM | POA: Diagnosis not present

## 2022-05-31 DIAGNOSIS — C44222 Squamous cell carcinoma of skin of right ear and external auricular canal: Secondary | ICD-10-CM | POA: Diagnosis not present

## 2022-06-03 ENCOUNTER — Telehealth: Payer: Self-pay | Admitting: Radiation Oncology

## 2022-06-03 DIAGNOSIS — M545 Low back pain, unspecified: Secondary | ICD-10-CM | POA: Diagnosis not present

## 2022-06-03 NOTE — Telephone Encounter (Signed)
Called patients mobile line to schedule patient for a consultation w. Dr. Isidore Moos. No answer, LVM for a return call.

## 2022-06-06 ENCOUNTER — Telehealth: Payer: Self-pay | Admitting: Radiation Oncology

## 2022-06-06 NOTE — Telephone Encounter (Signed)
Called patient to schedule a consultation w. Dr. Squire. No answer, LVM for a return call.  

## 2022-06-07 NOTE — Telephone Encounter (Signed)
Error

## 2022-06-08 ENCOUNTER — Emergency Department (HOSPITAL_COMMUNITY): Payer: Medicare Other

## 2022-06-08 ENCOUNTER — Encounter (HOSPITAL_COMMUNITY): Payer: Self-pay | Admitting: *Deleted

## 2022-06-08 ENCOUNTER — Other Ambulatory Visit: Payer: Self-pay

## 2022-06-08 ENCOUNTER — Inpatient Hospital Stay (HOSPITAL_COMMUNITY): Payer: Medicare Other

## 2022-06-08 ENCOUNTER — Inpatient Hospital Stay (HOSPITAL_COMMUNITY)
Admission: EM | Admit: 2022-06-08 | Discharge: 2022-06-15 | DRG: 637 | Disposition: A | Discharge status: Skilled Nursing Facility | Payer: Medicare Other | Attending: Internal Medicine | Admitting: Internal Medicine

## 2022-06-08 DIAGNOSIS — F05 Delirium due to known physiological condition: Secondary | ICD-10-CM | POA: Diagnosis not present

## 2022-06-08 DIAGNOSIS — G9340 Encephalopathy, unspecified: Secondary | ICD-10-CM

## 2022-06-08 DIAGNOSIS — Z789 Other specified health status: Secondary | ICD-10-CM | POA: Diagnosis not present

## 2022-06-08 DIAGNOSIS — M4626 Osteomyelitis of vertebra, lumbar region: Secondary | ICD-10-CM | POA: Diagnosis not present

## 2022-06-08 DIAGNOSIS — M4646 Discitis, unspecified, lumbar region: Secondary | ICD-10-CM | POA: Diagnosis present

## 2022-06-08 DIAGNOSIS — L97429 Non-pressure chronic ulcer of left heel and midfoot with unspecified severity: Secondary | ICD-10-CM | POA: Diagnosis present

## 2022-06-08 DIAGNOSIS — Z923 Personal history of irradiation: Secondary | ICD-10-CM

## 2022-06-08 DIAGNOSIS — K219 Gastro-esophageal reflux disease without esophagitis: Secondary | ICD-10-CM | POA: Diagnosis present

## 2022-06-08 DIAGNOSIS — R7989 Other specified abnormal findings of blood chemistry: Secondary | ICD-10-CM | POA: Diagnosis present

## 2022-06-08 DIAGNOSIS — Z885 Allergy status to narcotic agent status: Secondary | ICD-10-CM

## 2022-06-08 DIAGNOSIS — M549 Dorsalgia, unspecified: Secondary | ICD-10-CM | POA: Diagnosis not present

## 2022-06-08 DIAGNOSIS — E11621 Type 2 diabetes mellitus with foot ulcer: Secondary | ICD-10-CM | POA: Diagnosis not present

## 2022-06-08 DIAGNOSIS — N1832 Chronic kidney disease, stage 3b: Secondary | ICD-10-CM | POA: Diagnosis present

## 2022-06-08 DIAGNOSIS — R52 Pain, unspecified: Secondary | ICD-10-CM | POA: Diagnosis not present

## 2022-06-08 DIAGNOSIS — R1311 Dysphagia, oral phase: Secondary | ICD-10-CM | POA: Diagnosis not present

## 2022-06-08 DIAGNOSIS — M4644 Discitis, unspecified, thoracic region: Secondary | ICD-10-CM | POA: Diagnosis present

## 2022-06-08 DIAGNOSIS — R0602 Shortness of breath: Secondary | ICD-10-CM | POA: Diagnosis not present

## 2022-06-08 DIAGNOSIS — G9341 Metabolic encephalopathy: Secondary | ICD-10-CM | POA: Diagnosis not present

## 2022-06-08 DIAGNOSIS — R0689 Other abnormalities of breathing: Secondary | ICD-10-CM | POA: Diagnosis not present

## 2022-06-08 DIAGNOSIS — M4624 Osteomyelitis of vertebra, thoracic region: Secondary | ICD-10-CM | POA: Diagnosis present

## 2022-06-08 DIAGNOSIS — Z794 Long term (current) use of insulin: Secondary | ICD-10-CM

## 2022-06-08 DIAGNOSIS — Z950 Presence of cardiac pacemaker: Secondary | ICD-10-CM

## 2022-06-08 DIAGNOSIS — M2550 Pain in unspecified joint: Secondary | ICD-10-CM | POA: Diagnosis not present

## 2022-06-08 DIAGNOSIS — E876 Hypokalemia: Secondary | ICD-10-CM | POA: Diagnosis not present

## 2022-06-08 DIAGNOSIS — E785 Hyperlipidemia, unspecified: Secondary | ICD-10-CM | POA: Diagnosis not present

## 2022-06-08 DIAGNOSIS — K5730 Diverticulosis of large intestine without perforation or abscess without bleeding: Secondary | ICD-10-CM | POA: Diagnosis not present

## 2022-06-08 DIAGNOSIS — I517 Cardiomegaly: Secondary | ICD-10-CM | POA: Diagnosis not present

## 2022-06-08 DIAGNOSIS — I4891 Unspecified atrial fibrillation: Secondary | ICD-10-CM | POA: Diagnosis present

## 2022-06-08 DIAGNOSIS — I495 Sick sinus syndrome: Secondary | ICD-10-CM | POA: Diagnosis present

## 2022-06-08 DIAGNOSIS — E089 Diabetes mellitus due to underlying condition without complications: Secondary | ICD-10-CM | POA: Diagnosis not present

## 2022-06-08 DIAGNOSIS — T361X5A Adverse effect of cephalosporins and other beta-lactam antibiotics, initial encounter: Secondary | ICD-10-CM | POA: Diagnosis present

## 2022-06-08 DIAGNOSIS — E119 Type 2 diabetes mellitus without complications: Secondary | ICD-10-CM

## 2022-06-08 DIAGNOSIS — A419 Sepsis, unspecified organism: Secondary | ICD-10-CM | POA: Diagnosis not present

## 2022-06-08 DIAGNOSIS — I10 Essential (primary) hypertension: Secondary | ICD-10-CM

## 2022-06-08 DIAGNOSIS — Z8672 Personal history of thrombophlebitis: Secondary | ICD-10-CM

## 2022-06-08 DIAGNOSIS — Z85828 Personal history of other malignant neoplasm of skin: Secondary | ICD-10-CM

## 2022-06-08 DIAGNOSIS — E1169 Type 2 diabetes mellitus with other specified complication: Secondary | ICD-10-CM | POA: Diagnosis not present

## 2022-06-08 DIAGNOSIS — M869 Osteomyelitis, unspecified: Principal | ICD-10-CM

## 2022-06-08 DIAGNOSIS — Z711 Person with feared health complaint in whom no diagnosis is made: Secondary | ICD-10-CM | POA: Diagnosis not present

## 2022-06-08 DIAGNOSIS — R531 Weakness: Secondary | ICD-10-CM | POA: Diagnosis not present

## 2022-06-08 DIAGNOSIS — M109 Gout, unspecified: Secondary | ICD-10-CM | POA: Diagnosis present

## 2022-06-08 DIAGNOSIS — Z7901 Long term (current) use of anticoagulants: Secondary | ICD-10-CM

## 2022-06-08 DIAGNOSIS — Z7189 Other specified counseling: Secondary | ICD-10-CM | POA: Diagnosis not present

## 2022-06-08 DIAGNOSIS — I69391 Dysphagia following cerebral infarction: Secondary | ICD-10-CM | POA: Diagnosis not present

## 2022-06-08 DIAGNOSIS — R64 Cachexia: Secondary | ICD-10-CM | POA: Diagnosis not present

## 2022-06-08 DIAGNOSIS — Z66 Do not resuscitate: Secondary | ICD-10-CM | POA: Diagnosis not present

## 2022-06-08 DIAGNOSIS — Z741 Need for assistance with personal care: Secondary | ICD-10-CM | POA: Diagnosis not present

## 2022-06-08 DIAGNOSIS — Z515 Encounter for palliative care: Secondary | ICD-10-CM

## 2022-06-08 DIAGNOSIS — R410 Disorientation, unspecified: Secondary | ICD-10-CM | POA: Diagnosis not present

## 2022-06-08 DIAGNOSIS — Z8249 Family history of ischemic heart disease and other diseases of the circulatory system: Secondary | ICD-10-CM

## 2022-06-08 DIAGNOSIS — R6889 Other general symptoms and signs: Secondary | ICD-10-CM | POA: Diagnosis not present

## 2022-06-08 DIAGNOSIS — R4182 Altered mental status, unspecified: Secondary | ICD-10-CM | POA: Diagnosis not present

## 2022-06-08 DIAGNOSIS — K5900 Constipation, unspecified: Secondary | ICD-10-CM | POA: Diagnosis not present

## 2022-06-08 DIAGNOSIS — I4821 Permanent atrial fibrillation: Secondary | ICD-10-CM | POA: Diagnosis present

## 2022-06-08 DIAGNOSIS — I4510 Unspecified right bundle-branch block: Secondary | ICD-10-CM | POA: Diagnosis present

## 2022-06-08 DIAGNOSIS — Z87891 Personal history of nicotine dependence: Secondary | ICD-10-CM

## 2022-06-08 DIAGNOSIS — Z888 Allergy status to other drugs, medicaments and biological substances status: Secondary | ICD-10-CM

## 2022-06-08 DIAGNOSIS — R0902 Hypoxemia: Secondary | ICD-10-CM | POA: Diagnosis not present

## 2022-06-08 DIAGNOSIS — D72829 Elevated white blood cell count, unspecified: Secondary | ICD-10-CM | POA: Diagnosis not present

## 2022-06-08 DIAGNOSIS — N179 Acute kidney failure, unspecified: Secondary | ICD-10-CM | POA: Diagnosis present

## 2022-06-08 DIAGNOSIS — Z681 Body mass index (BMI) 19 or less, adult: Secondary | ICD-10-CM | POA: Diagnosis not present

## 2022-06-08 DIAGNOSIS — Z452 Encounter for adjustment and management of vascular access device: Secondary | ICD-10-CM | POA: Diagnosis not present

## 2022-06-08 DIAGNOSIS — I499 Cardiac arrhythmia, unspecified: Secondary | ICD-10-CM | POA: Diagnosis not present

## 2022-06-08 DIAGNOSIS — M6281 Muscle weakness (generalized): Secondary | ICD-10-CM | POA: Diagnosis not present

## 2022-06-08 DIAGNOSIS — E1122 Type 2 diabetes mellitus with diabetic chronic kidney disease: Secondary | ICD-10-CM | POA: Diagnosis not present

## 2022-06-08 DIAGNOSIS — Z743 Need for continuous supervision: Secondary | ICD-10-CM | POA: Diagnosis not present

## 2022-06-08 DIAGNOSIS — I5032 Chronic diastolic (congestive) heart failure: Secondary | ICD-10-CM | POA: Diagnosis present

## 2022-06-08 DIAGNOSIS — Z8719 Personal history of other diseases of the digestive system: Secondary | ICD-10-CM

## 2022-06-08 DIAGNOSIS — K5903 Drug induced constipation: Secondary | ICD-10-CM | POA: Diagnosis not present

## 2022-06-08 DIAGNOSIS — I130 Hypertensive heart and chronic kidney disease with heart failure and stage 1 through stage 4 chronic kidney disease, or unspecified chronic kidney disease: Secondary | ICD-10-CM | POA: Diagnosis not present

## 2022-06-08 DIAGNOSIS — N200 Calculus of kidney: Secondary | ICD-10-CM | POA: Diagnosis not present

## 2022-06-08 DIAGNOSIS — R9431 Abnormal electrocardiogram [ECG] [EKG]: Secondary | ICD-10-CM | POA: Diagnosis not present

## 2022-06-08 DIAGNOSIS — Z7401 Bed confinement status: Secondary | ICD-10-CM | POA: Diagnosis not present

## 2022-06-08 DIAGNOSIS — K222 Esophageal obstruction: Secondary | ICD-10-CM | POA: Diagnosis not present

## 2022-06-08 DIAGNOSIS — Z89611 Acquired absence of right leg above knee: Secondary | ICD-10-CM

## 2022-06-08 DIAGNOSIS — Z833 Family history of diabetes mellitus: Secondary | ICD-10-CM

## 2022-06-08 DIAGNOSIS — L8990 Pressure ulcer of unspecified site, unspecified stage: Secondary | ICD-10-CM | POA: Insufficient documentation

## 2022-06-08 DIAGNOSIS — Z993 Dependence on wheelchair: Secondary | ICD-10-CM

## 2022-06-08 DIAGNOSIS — R638 Other symptoms and signs concerning food and fluid intake: Secondary | ICD-10-CM | POA: Diagnosis not present

## 2022-06-08 DIAGNOSIS — G8929 Other chronic pain: Secondary | ICD-10-CM | POA: Diagnosis present

## 2022-06-08 DIAGNOSIS — R1310 Dysphagia, unspecified: Secondary | ICD-10-CM | POA: Diagnosis present

## 2022-06-08 LAB — I-STAT VENOUS BLOOD GAS, ED
Acid-Base Excess: 4 mmol/L — ABNORMAL HIGH (ref 0.0–2.0)
Bicarbonate: 29.1 mmol/L — ABNORMAL HIGH (ref 20.0–28.0)
Calcium, Ion: 1.19 mmol/L (ref 1.15–1.40)
HCT: 39 % (ref 39.0–52.0)
Hemoglobin: 13.3 g/dL (ref 13.0–17.0)
O2 Saturation: 57 %
Potassium: 3.5 mmol/L (ref 3.5–5.1)
Sodium: 139 mmol/L (ref 135–145)
TCO2: 30 mmol/L (ref 22–32)
pCO2, Ven: 46.1 mmHg (ref 44–60)
pH, Ven: 7.408 (ref 7.25–7.43)
pO2, Ven: 30 mmHg — CL (ref 32–45)

## 2022-06-08 LAB — COMPREHENSIVE METABOLIC PANEL
ALT: 14 U/L (ref 0–44)
AST: 16 U/L (ref 15–41)
Albumin: 3.7 g/dL (ref 3.5–5.0)
Alkaline Phosphatase: 103 U/L (ref 38–126)
Anion gap: 15 (ref 5–15)
BUN: 21 mg/dL (ref 8–23)
CO2: 25 mmol/L (ref 22–32)
Calcium: 10.1 mg/dL (ref 8.9–10.3)
Chloride: 100 mmol/L (ref 98–111)
Creatinine, Ser: 1.74 mg/dL — ABNORMAL HIGH (ref 0.61–1.24)
GFR, Estimated: 38 mL/min — ABNORMAL LOW (ref >60)
Glucose, Bld: 163 mg/dL — ABNORMAL HIGH (ref 70–99)
Potassium: 3.6 mmol/L (ref 3.5–5.1)
Sodium: 140 mmol/L (ref 135–145)
Total Bilirubin: 0.8 mg/dL (ref 0.3–1.2)
Total Protein: 7.2 g/dL (ref 6.5–8.1)

## 2022-06-08 LAB — BRAIN NATRIURETIC PEPTIDE: B Natriuretic Peptide: 215.4 pg/mL — ABNORMAL HIGH (ref 0.0–100.0)

## 2022-06-08 LAB — URINALYSIS, ROUTINE W REFLEX MICROSCOPIC
Bacteria, UA: NONE SEEN
Bilirubin Urine: NEGATIVE
Glucose, UA: 50 mg/dL — AB
Hgb urine dipstick: NEGATIVE
Ketones, ur: 5 mg/dL — AB
Leukocytes,Ua: NEGATIVE
Nitrite: NEGATIVE
Protein, ur: >=300 mg/dL — AB
Specific Gravity, Urine: 1.008 (ref 1.005–1.030)
pH: 7 (ref 5.0–8.0)

## 2022-06-08 LAB — VITAMIN B12: Vitamin B-12: 341 pg/mL (ref 180–914)

## 2022-06-08 LAB — CBC WITH DIFFERENTIAL/PLATELET
Abs Immature Granulocytes: 0.05 10*3/uL (ref 0.00–0.07)
Basophils Absolute: 0 10*3/uL (ref 0.0–0.1)
Basophils Relative: 0 %
Eosinophils Absolute: 0 10*3/uL (ref 0.0–0.5)
Eosinophils Relative: 0 %
HCT: 41 % (ref 39.0–52.0)
Hemoglobin: 13 g/dL (ref 13.0–17.0)
Immature Granulocytes: 1 %
Lymphocytes Relative: 14 %
Lymphs Abs: 1.2 10*3/uL (ref 0.7–4.0)
MCH: 27.3 pg (ref 26.0–34.0)
MCHC: 31.7 g/dL (ref 30.0–36.0)
MCV: 86 fL (ref 80.0–100.0)
Monocytes Absolute: 0.5 10*3/uL (ref 0.1–1.0)
Monocytes Relative: 6 %
Neutro Abs: 6.9 10*3/uL (ref 1.7–7.7)
Neutrophils Relative %: 79 %
Platelets: 303 10*3/uL (ref 150–400)
RBC: 4.77 MIL/uL (ref 4.22–5.81)
RDW: 14.3 % (ref 11.5–15.5)
WBC: 8.6 10*3/uL (ref 4.0–10.5)
nRBC: 0 % (ref 0.0–0.2)

## 2022-06-08 LAB — ECHOCARDIOGRAM COMPLETE
Area-P 1/2: 4.01 cm2
Height: 70 in
MV VTI: 1.26 cm2
S' Lateral: 2.85 cm
Weight: 2560 oz

## 2022-06-08 LAB — AMMONIA: Ammonia: <10 umol/L (ref 9–35)

## 2022-06-08 LAB — C-REACTIVE PROTEIN: CRP: 0.8 mg/dL (ref <1.0)

## 2022-06-08 LAB — TSH: TSH: 3.461 u[IU]/mL (ref 0.350–4.500)

## 2022-06-08 LAB — TROPONIN I (HIGH SENSITIVITY)
Troponin I (High Sensitivity): 11 ng/L (ref <18)
Troponin I (High Sensitivity): 11 ng/L (ref <18)

## 2022-06-08 LAB — APTT: aPTT: 32 seconds (ref 24–36)

## 2022-06-08 LAB — HEMOGLOBIN A1C
Hgb A1c MFr Bld: 6.4 % — ABNORMAL HIGH (ref 4.8–5.6)
Mean Plasma Glucose: 136.98 mg/dL

## 2022-06-08 LAB — LACTIC ACID, PLASMA
Lactic Acid, Venous: 1.3 mmol/L (ref 0.5–1.9)
Lactic Acid, Venous: 1.5 mmol/L (ref 0.5–1.9)

## 2022-06-08 LAB — CBG MONITORING, ED
Glucose-Capillary: 141 mg/dL — ABNORMAL HIGH (ref 70–99)
Glucose-Capillary: 139 mg/dL — ABNORMAL HIGH (ref 70–99)

## 2022-06-08 LAB — SEDIMENTATION RATE: Sed Rate: 38 mm/hr — ABNORMAL HIGH (ref 0–16)

## 2022-06-08 LAB — PROTIME-INR
INR: 1.2 (ref 0.8–1.2)
Prothrombin Time: 15.3 seconds — ABNORMAL HIGH (ref 11.4–15.2)

## 2022-06-08 MED ORDER — OXYCODONE HCL 5 MG PO TABS: 10.0000 mg | ORAL_TABLET | Freq: Two times a day (BID) | ORAL | Status: DC | PRN

## 2022-06-08 MED ORDER — VANCOMYCIN HCL 750 MG/150ML IV SOLN
750.0000 mg | INTRAVENOUS | Status: DC
Start: 2022-06-09 — End: 2022-06-11
  Administered 2022-06-09 – 2022-06-10 (×2): 750 mg via INTRAVENOUS
  Filled 2022-06-08 (×2): qty 150

## 2022-06-08 MED ORDER — HEPARIN (PORCINE) 25000 UT/250ML-% IV SOLN
1100.0000 [IU]/h | INTRAVENOUS | Status: DC
  Administered 2022-06-08: 1100 [IU]/h via INTRAVENOUS
  Filled 2022-06-08: qty 250

## 2022-06-08 MED ORDER — APIXABAN 2.5 MG PO TABS: 2.5000 mg | ORAL_TABLET | Freq: Two times a day (BID) | ORAL | Status: DC

## 2022-06-08 MED ORDER — DILTIAZEM HCL 25 MG/5ML IV SOLN: 5.0000 mg | Freq: Once | INTRAVENOUS | Status: DC

## 2022-06-08 MED ORDER — SODIUM CHLORIDE 0.9 % IV SOLN
2.0000 g | Freq: Two times a day (BID) | INTRAVENOUS | Status: DC
  Administered 2022-06-08 – 2022-06-09 (×3): 2 g via INTRAVENOUS
  Filled 2022-06-08 (×3): qty 12.5

## 2022-06-08 MED ORDER — NICARDIPINE HCL IN NACL 20-0.86 MG/200ML-% IV SOLN
3.0000 mg/h | INTRAVENOUS | Status: DC
  Filled 2022-06-08: qty 200

## 2022-06-08 MED ORDER — IOHEXOL 350 MG/ML SOLN
80.0000 mL | Freq: Once | INTRAVENOUS | Status: AC | PRN
  Administered 2022-06-08: 80 mL via INTRAVENOUS

## 2022-06-08 MED ORDER — FENTANYL CITRATE PF 50 MCG/ML IJ SOSY
50.0000 ug | PREFILLED_SYRINGE | Freq: Once | INTRAMUSCULAR | Status: AC
  Administered 2022-06-08: 50 ug via INTRAVENOUS
  Filled 2022-06-08: qty 1

## 2022-06-08 MED ORDER — DILTIAZEM HCL 25 MG/5ML IV SOLN
10.0000 mg | Freq: Once | INTRAVENOUS | Status: AC
  Administered 2022-06-08: 10 mg via INTRAVENOUS
  Filled 2022-06-08: qty 5

## 2022-06-08 MED ORDER — LACTATED RINGERS IV BOLUS
1000.0000 mL | Freq: Once | INTRAVENOUS | Status: AC
  Administered 2022-06-08: 1000 mL via INTRAVENOUS

## 2022-06-08 MED ORDER — HYDROMORPHONE HCL 1 MG/ML IJ SOLN
0.5000 mg | INTRAMUSCULAR | Status: DC | PRN
  Administered 2022-06-08 – 2022-06-09 (×5): 0.5 mg via INTRAVENOUS
  Filled 2022-06-08 (×3): qty 1
  Filled 2022-06-08: qty 0.5
  Filled 2022-06-08: qty 1

## 2022-06-08 MED ORDER — METOPROLOL TARTRATE 5 MG/5ML IV SOLN
5.0000 mg | Freq: Four times a day (QID) | INTRAVENOUS | Status: DC | PRN
  Administered 2022-06-08 – 2022-06-09 (×3): 5 mg via INTRAVENOUS
  Filled 2022-06-08 (×3): qty 5

## 2022-06-08 MED ORDER — GABAPENTIN 100 MG PO CAPS
100.0000 mg | ORAL_CAPSULE | Freq: Two times a day (BID) | ORAL | Status: DC
Start: 2022-06-08 — End: 2022-06-10
  Filled 2022-06-08 (×2): qty 1

## 2022-06-08 MED ORDER — SODIUM CHLORIDE 0.9 % IV SOLN
INTRAVENOUS | Status: AC
Start: 2022-06-08 — End: 2022-06-08

## 2022-06-08 MED ORDER — VANCOMYCIN HCL 1500 MG/300ML IV SOLN
1500.0000 mg | Freq: Once | INTRAVENOUS | Status: AC
  Administered 2022-06-08: 1500 mg via INTRAVENOUS
  Filled 2022-06-08: qty 300

## 2022-06-08 MED ORDER — PANTOPRAZOLE SODIUM 40 MG IV SOLR
40.0000 mg | INTRAVENOUS | Status: DC
  Administered 2022-06-08 – 2022-06-11 (×4): 40 mg via INTRAVENOUS
  Filled 2022-06-08 (×4): qty 10

## 2022-06-08 MED ORDER — PIPERACILLIN-TAZOBACTAM 3.375 G IVPB: 3.3750 g | Freq: Three times a day (TID) | INTRAVENOUS | Status: DC

## 2022-06-08 MED ORDER — DILTIAZEM HCL ER COATED BEADS 180 MG PO CP24
180.0000 mg | ORAL_CAPSULE | Freq: Every day | ORAL | Status: DC
  Administered 2022-06-10 – 2022-06-15 (×6): 180 mg via ORAL
  Filled 2022-06-08 (×7): qty 1

## 2022-06-08 MED ORDER — INSULIN ASPART 100 UNIT/ML IJ SOLN
0.0000 [IU] | Freq: Three times a day (TID) | INTRAMUSCULAR | Status: DC
  Administered 2022-06-08 – 2022-06-11 (×7): 1 [IU] via SUBCUTANEOUS
  Administered 2022-06-11 – 2022-06-12 (×3): 2 [IU] via SUBCUTANEOUS
  Administered 2022-06-12: 5 [IU] via SUBCUTANEOUS
  Administered 2022-06-13: 1 [IU] via SUBCUTANEOUS
  Administered 2022-06-13: 3 [IU] via SUBCUTANEOUS
  Administered 2022-06-13: 1 [IU] via SUBCUTANEOUS
  Administered 2022-06-14: 2 [IU] via SUBCUTANEOUS
  Administered 2022-06-14: 3 [IU] via SUBCUTANEOUS
  Administered 2022-06-14 – 2022-06-15 (×2): 2 [IU] via SUBCUTANEOUS
  Administered 2022-06-15: 5 [IU] via SUBCUTANEOUS

## 2022-06-08 MED ORDER — ACETAMINOPHEN 650 MG RE SUPP: 650.0000 mg | Freq: Four times a day (QID) | RECTAL | Status: DC | PRN

## 2022-06-08 MED ORDER — ACETAMINOPHEN 325 MG PO TABS
650.0000 mg | ORAL_TABLET | Freq: Four times a day (QID) | ORAL | Status: DC | PRN
  Administered 2022-06-11: 650 mg via ORAL
  Filled 2022-06-08: qty 2

## 2022-06-08 MED ORDER — PIPERACILLIN-TAZOBACTAM 3.375 G IVPB 30 MIN
3.3750 g | Freq: Once | INTRAVENOUS | Status: AC
  Administered 2022-06-08: 3.375 g via INTRAVENOUS
  Filled 2022-06-08: qty 50

## 2022-06-08 NOTE — ED Provider Notes (Signed)
[facility] EMERGENCY DEPARTMENT Provider Note  CSN: [id number] Arrival date & time: 06/08/22 0906  Chief Complaint(s) Altered Mental Status  HPI [name] is a 87 y.o. male with PMH permanent A-fib on Eliquis, CHF, HTN, tachybradycardia syndrome status post pacemaker placement, T2DM on insulin who presents emergency department for evaluation of altered mental status.  History obtained primarily from patient's wife who states that at 40 PM on 06/07/2022 her husband went down from the condominium that they live in to pick up groceries from the car and had an acute change in mental status.  She states that he was staring off into space and repeating nonsensical answers to questions.  She states that she did not call EMS until this morning and the fire department came and evaluated the patient who found him to be apparently hypoxic into the low 80s where he was placed on nasal cannula and upgraded to a nonrebreather but on arrival here in the emergency department, patient is not hypoxic and saturating 99% on room air.  Patient arrives disoriented saying "yes" to every question asked and slumped to the left side of the bed.  He arrives tachycardic and hypertensive.  Additional history or review of systems unable to be obtained given patient's altered mental status.   Past Medical History Past Medical History:  Diagnosis Date   Atrial fibrillation ([place])    permanent   Backache, unspecified    Cellulitis and abscess of leg, except foot    CHF (congestive heart failure) ([place])    Diverticulosis 05-2006   Dr. [name]    Esophageal stricture    Fatigue    GERD (gastroesophageal reflux disease)    Gout, unspecified    Hx of colonic polyps 05-2006   (Adenomatous)Dr. [name]   Hx of radiation therapy 06/16/14- 07/18/14   mult squamous cell carcinomas of scalp   Hypertension    Long term (current) use of anticoagulants    Overweight(278.02)    Pacemaker- [name] 02/20/2013    Phlebitis and thrombophlebitis of superficial vessels of lower extremities    Right bundle branch block    Skin cancer    squamous cell carcinoma scalp   Syncope and collapse    Tachycardia-bradycardia ([place])    s/p PPM   Type II or unspecified type diabetes mellitus without mention of complication, not stated as uncontrolled    Unspecified venous (peripheral) insufficiency    Patient Active Problem List   Diagnosis Date Noted   Sepsis ([place]) 03/23/2017   CKD (chronic kidney disease), stage III ([place]) 03/23/2017   Leukocytosis    Cellulitis 03/22/2017   Diabetic foot ulcer associated with type 2 diabetes mellitus ([place]) 11/24/2014   Squamous cell carcinoma of scalp and skin of neck 05/29/2014   Unilateral AKA, right ([place]) 04/08/2014   PAD (peripheral artery disease) ([place]) 04/08/2014   Gangrene associated with type 2 diabetes mellitus ([place]) 04/02/2014   Generalized weakness 03/23/2014   Wound dehiscence 03/15/2014   Coagulopathy ([place]) 03/09/2014   Anemia due to acute blood loss 03/08/2014   Diabetes mellitus ([place]) 03/02/2014   Anemia 03/01/2014   Constipation 02/18/2014   Insomnia 02/18/2014   Amputation stump infection ([place]) 02/06/2014   Hx of right BKA ([place]) 01/28/2014   Osteomyelitis ([place]) 01/16/2014   Diabetic osteomyelitis ([place]) 01/09/2014   Pacemaker- [name] 02/20/2013   Hyperlipidemia 12/12/2012   TIA (transient ischemic attack) 12/11/2012   ETOH abuse 04/02/2012   ACTINIC KERATOSIS 11/29/2010   MULTIPLE&UNSPEC OPEN WOUND LOWER  LIMB COMP 10/19/2010   ADHESIVE CAPSULITIS OF SHOULDER 08/30/2010   ESOPHAGEAL STRICTURE 05/19/2010   DYSPHAGIA UNSPECIFIED 04/08/2010   DUPUYTREN'S CONTRACTURE, LEFT 12/21/2009   CERVICAL STRAIN, ACUTE 08/28/2009   OVERWEIGHT/OBESITY 04/07/2009   RBBB 04/07/2009   GERD 04/07/2009   SYNCOPE AND COLLAPSE 04/07/2009   FATIGUE / MALAISE 04/07/2009   PHLEBITIS&THROMBOPHLEB SUP VESSELS LOWER EXTREM 02/28/2008   CELLULITIS/ABSCESS, LEG  02/28/2008   BACK PAIN, CHRONIC 10/31/2007   Unspecified venous (peripheral) insufficiency 07/02/2007   GOUT 06/26/2007   Essential hypertension 06/26/2007   Atrial fibrillation ([place]) 06/26/2007   Chronic diastolic CHF (congestive heart failure) ([place]) 06/26/2007   Home Medication(s) Prior to Admission medications   Medication Sig Start Date End Date Taking? Authorizing Provider  ACCU-CHEK SOFTCLIX LANCETS lancets Use to check blood sugar three times daily. Dx E11.9 04/26/17   Mariea Clonts, Tiffany L, DO  apixaban (ELIQUIS) 5 MG TABS tablet Take 5 mg by mouth 2 (two) times daily.     [provider]  B-D ULTRAFINE III SHORT PEN 31G X 8 MM MISC USE AS DIRECTED 02/20/18   Reed, Tiffany L, DO  Blood Glucose Calibration (ACCU-CHEK AVIVA) SOLN Use as Directed. Dx E11.9 05/01/17   Reed, Tiffany L, DO  cephALEXin (KEFLEX) 500 MG capsule TAKE (1) CAPSULE THREE TIMES DAILY. 09/02/20   Wallene Huh, DPM  cephALEXin (KEFLEX) 500 MG capsule Take 1 capsule (500 mg total) by mouth 3 (three) times daily. 09/24/20   Wallene Huh, DPM  cephALEXin (KEFLEX) 500 MG capsule Take 1 capsule (500 mg total) by mouth 2 (two) times daily. 02/25/21   Wallene Huh, DPM  cephALEXin (KEFLEX) 500 MG capsule Take 1 capsule (500 mg total) by mouth 3 (three) times daily. 05/27/21   Wallene Huh, DPM  diltiazem (CARDIZEM CD) 180 MG 24 hr capsule 180 mg daily.  01/07/19   [provider]  furosemide (LASIX) 20 MG tablet Take 20 mg by mouth every other day.    [provider]  gabapentin (NEURONTIN) 100 MG capsule Take 100 mg by mouth 2 (two) times daily.    [provider]  glucose blood (ACCU-CHEK AVIVA PLUS) test strip DX E11.9, check blood sugar three times daily 04/18/17   Reed, Tiffany L, DO  hydrocortisone 2.5 % ointment Apply topically as needed. 08/05/20   [provider]  Insulin Glargine (LANTUS SOLOSTAR [place]) Inject into the skin as needed.     [provider]  insulin lispro  (HUMALOG) 100 UNIT/ML KiwkPen Inject into the skin as needed.     [provider]  melatonin 5 MG TABS at bedtime.    [provider]  Multiple Vitamins-Minerals (MULTIVITAMIN PO) Take 1 tablet by mouth daily. Takes a Men's 50 Plus Multivitamin    [provider]  omeprazole (PRILOSEC) 20 MG capsule Take 20 mg by mouth daily.    [provider]  Oxycodone HCl 10 MG TABS Take 1 tablet (10 mg total) by mouth every 8 (eight) hours as needed. For pain 08/15/17   Gildardo Cranker, DO  Past Surgical History Past Surgical History:  Procedure Laterality Date   ABOVE KNEE LEG AMPUTATION Right 04/02/2014   DR DUDA   AMPUTATION Right 01/20/2014   Procedure: AMPUTATION BELOW KNEE- right;  Surgeon: [name], MD;  Location: [facility];  Service: Orthopedics;  Laterality: Right;  Right Below Knee Amputation   AMPUTATION Right 02/14/2014   Procedure: AMPUTATION BELOW KNEE;  Surgeon: [name], MD;  Location: [facility];  Service: Orthopedics;  Laterality: Right;  Right Below Knee Amputation Revision   AMPUTATION Right 03/07/2014   Procedure: AMPUTATION BELOW KNEE;  Surgeon: [name], MD;  Location: [facility];  Service: Orthopedics;  Laterality: Right;  Right Below Knee Amputation Revision   AMPUTATION Right 03/18/2014   Procedure: revision of  AMPUTATION BELOW KNEE;  Surgeon: [name], MD;  Location: [employer];  Service: Orthopedics;  Laterality: Right;  Revision Right Below Knee Amputation   AMPUTATION Right 04/02/2014   Procedure: AMPUTATION ABOVE KNEE;  Surgeon: [name], MD;  Location: [facility];  Service: Orthopedics;  Laterality: Right;  Right Above Knee Amputation   EYE SURGERY     I & D EXTREMITY Right 01/17/2014   Procedure: IRRIGATION AND DEBRIDEMENT RIGHT HEEL  WITH CULTURES AND BONE BIOPSY, placement of wound vac;  Surgeon: Theodoro Kos, DO;   Location: [facility];  Service: Plastics;  Laterality: Right;   LAMINECTOMY     LUMBAR FUSION     PACEMAKER INSERTION  8/12   SJM by [name] for tachy/brady syndrome   ROTATOR CUFF REPAIR     ruptured rt rectus muscle     SKIN BIOPSY     scalp   Family History Family History  Problem Relation Age of Onset   Pneumonia Mother    Heart disease Father    Diabetes Father    Colon cancer Neg Hx     Social History Social History   Tobacco Use   Smoking status: Former   Smokeless tobacco: Never   Tobacco comments:    Quit at age 58   Vaping Use   Vaping Use: Never used  Substance Use Topics   Alcohol use: No    Alcohol/week: 0.0 standard drinks of alcohol    Comment: none currenlty   Drug use: No   Allergies Morphine  Review of Systems Review of Systems  Unable to perform ROS: Mental status change    Physical Exam Vital Signs  I have reviewed the triage vital signs BP (!) 206/87   Pulse 98   Temp 98 F (36.7 C)   Resp 17   SpO2 100%   Physical Exam Vitals and nursing note reviewed.  Constitutional:      General: He is not in acute distress.    Appearance: He is well-developed. He is ill-appearing.  HENT:     Head: Normocephalic and atraumatic.  Eyes:     Conjunctiva/sclera: Conjunctivae normal.  Cardiovascular:     Rate and Rhythm: Regular rhythm. Tachycardia present.     Heart sounds: No murmur heard. Pulmonary:     Effort: Pulmonary effort is normal. No respiratory distress.     Breath sounds: Normal breath sounds.  Abdominal:     Palpations: Abdomen is soft.     Tenderness: There is no abdominal tenderness.  Musculoskeletal:        General: No swelling.     Cervical back: Neck supple.  Skin:    General: Skin is warm and dry.  Capillary Refill: Capillary refill takes less than 2 seconds.  Neurological:     Mental Status: He is alert.     Cranial Nerves: No cranial nerve deficit.     Motor: No weakness.  Psychiatric:        Mood and Affect:  Mood normal.     ED Results and Treatments Labs (all labs ordered are listed, but only abnormal results are displayed) Labs Reviewed  COMPREHENSIVE METABOLIC PANEL - Abnormal; Notable for the following components:      Result Value   Glucose, Bld 163 (*)    Creatinine, Ser 1.74 (*)    GFR, Estimated 38 (*)    All other components within normal limits  BRAIN NATRIURETIC PEPTIDE - Abnormal; Notable for the following components:   B Natriuretic Peptide 215.4 (*)    All other components within normal limits  URINALYSIS, ROUTINE W REFLEX MICROSCOPIC - Abnormal; Notable for the following components:   Color, Urine STRAW (*)    Glucose, UA 50 (*)    Ketones, ur 5 (*)    Protein, ur >=300 (*)    All other components within normal limits  PROTIME-INR - Abnormal; Notable for the following components:   Prothrombin Time 15.3 (*)    All other components within normal limits  I-STAT VENOUS BLOOD GAS, ED - Abnormal; Notable for the following components:   pO2, Ven 30 (*)    Bicarbonate 29.1 (*)    Acid-Base Excess 4.0 (*)    All other components within normal limits  URINE CULTURE  CULTURE, BLOOD (ROUTINE X 2)  CULTURE, BLOOD (ROUTINE X 2)  CBC WITH DIFFERENTIAL/PLATELET  TSH  LACTIC ACID, PLASMA  LACTIC ACID, PLASMA  APTT  BLOOD GAS, VENOUS  TROPONIN I (HIGH SENSITIVITY)  TROPONIN I (HIGH SENSITIVITY)                                                                                                                          Radiology CT Angio Chest PE W and/or Wo Contrast  Result Date: 06/08/2022 CLINICAL DATA:  High probability for acute pulmonary embolus. Sepsis. EXAM: CT ANGIOGRAPHY CHEST CT ABDOMEN AND PELVIS WITH CONTRAST TECHNIQUE: Multidetector CT imaging of the chest was performed using the standard protocol during bolus administration of intravenous contrast. Multiplanar CT image reconstructions and MIPs were obtained to evaluate the vascular anatomy. Multidetector CT imaging  of the abdomen and pelvis was performed using the standard protocol during bolus administration of intravenous contrast. RADIATION DOSE REDUCTION: This exam was performed according to the departmental dose-optimization program which includes automated exposure control, adjustment of the mA and/or kV according to patient size and/or use of iterative reconstruction technique. CONTRAST:  100m OMNIPAQUE IOHEXOL 350 MG/ML SOLN COMPARISON:  CT AP from 09/20/18 FINDINGS: CTA CHEST FINDINGS Cardiovascular: Satisfactory opacification of the pulmonary arteries to the segmental level. No evidence of pulmonary embolism. Mild cardiac enlargement. Aortic atherosclerosis and coronary artery calcifications. Mediastinum/Nodes: Thyroid gland and trachea are unremarkable. Mild increase  caliber of the air-filled proximal esophagus is identified to the level of the carina. Below the carina there is focal dilatation of the thoracic esophagus containing food debris measuring 3.4 cm in diameter, image 87/6. This is compared with 1.3 cm above the carina. The distal esophagus just above the GE junction has a normal caliber. Underlying stricture formation cannot be excluded. No enlarged lymph nodes identified. Multiple calcified mediastinal and hilar lymph nodes are identified compatible with remote granulomatous disease. Lungs/Pleura: No pleural effusion, airspace consolidation or signs of pneumothorax. There is mild diffuse ground-glass attenuation identified throughout both lungs. Scattered calcified granulomas are noted bilaterally. Dependent changes noted within the posterior lung bases bilaterally. Musculoskeletal: Abnormal appearance of the lower thoracic spine at the T12-L1 level. There is complete erosion of the superior endplate of Q59 and much of the anterior superior aspect of the T12 vertebral body. The inferior endplate of D63 also appears eroded. There is surrounding paravertebral soft tissue stranding, image 152/6.  Retropulsion bone fragments identified into the canal, image 151/6. No discrete fluid collection within the limitations of CT. Imaging findings are compatible with discitis/osteomyelitis at T11-12. Review of the MIP images confirms the above findings. CT ABDOMEN and PELVIS FINDINGS Hepatobiliary: No suspicious liver lesions. Multiple calcified granulomas noted. Gallbladder appears normal. No bile duct dilatation. Pancreas: Fatty infiltration of the pancreas. No signs of main duct dilatation, inflammation or mass. Spleen: No suspicious findings.  Numerous calcified granulomas. Adrenals/Urinary Tract: The adrenal glands are within normal limits. Bilateral renal cortical scarring and mild volume loss. Right kidney stone measures 3 mm. Benign Bosniak class 1 cyst arises off the inferior pole of the right kidney. No follow-up imaging recommended. No hydronephrosis identified bilaterally. Urinary bladder appears normal. Stomach/Bowel: Stomach is within normal limits. No bowel wall thickening, inflammation or distension. Sigmoid diverticulosis without signs of acute inflammation. Vascular/Lymphatic: Aortic atherosclerosis. No aneurysm. No signs of abdominopelvic adenopathy. Reproductive: Prostate is unremarkable. Other: No free fluid or fluid collections identified. Musculoskeletal: Postoperative changes from L4-5 hardware fixation with solid fusion noted at L3-4 and L4-5. The disc space at L2-3 is indistinct with erosive in plate changes which are also suspicious for discitis/osteomyelitis. No significant surrounding inflammatory changes noted. Review of the MIP images confirms the above findings. IMPRESSION: 1. No evidence for acute pulmonary embolus. 2. Signs of acute discitis/osteomyelitis at T11-12 with significant endplate destruction of both vertebral bodies and retropulsion of bony fragment into the ventral spinal canal. Surrounding soft tissue stranding is identified. 3. Mild discitis/osteomyelitis is also  suspected at the L2-3 level. Here, the disc space is indistinct with mild erosive changes involving the endplates. No significant surrounding inflammatory fat stranding. 4. Mild diffuse ground-glass attenuation identified throughout both lungs which may reflect mild pulmonary edema. 5. There is abnormal dilatation of the proximal and midthoracic esophagus. This is most dilated just below the level of the carina. The distal segment of the esophagus has a normal caliber. Cannot exclude distal esophageal stricture. 6. Sequelae of remote granulomatous disease. 7. Nonobstructing right renal calculus. 8. Sigmoid diverticulosis without signs of acute inflammation. 9. Aortic Atherosclerosis (ICD10-I70.0). Critical Value/emergent results were called by telephone at the time of interpretation on 06/08/2022 at 12:44 pm to provider Lynsay Fesperman [facility] , who verbally acknowledged these results. Electronically Signed   By: Kerby Moors M.D.   On: 06/08/2022 12:45   CT ABDOMEN PELVIS W CONTRAST  Result Date: 06/08/2022 CLINICAL DATA:  High probability for acute pulmonary embolus. Sepsis. EXAM: CT ANGIOGRAPHY CHEST CT ABDOMEN AND PELVIS  WITH CONTRAST TECHNIQUE: Multidetector CT imaging of the chest was performed using the standard protocol during bolus administration of intravenous contrast. Multiplanar CT image reconstructions and MIPs were obtained to evaluate the vascular anatomy. Multidetector CT imaging of the abdomen and pelvis was performed using the standard protocol during bolus administration of intravenous contrast. RADIATION DOSE REDUCTION: This exam was performed according to the departmental dose-optimization program which includes automated exposure control, adjustment of the mA and/or kV according to patient size and/or use of iterative reconstruction technique. CONTRAST:  17m OMNIPAQUE IOHEXOL 350 MG/ML SOLN COMPARISON:  CT AP from 09/20/18 FINDINGS: CTA CHEST FINDINGS Cardiovascular: Satisfactory opacification of  the pulmonary arteries to the segmental level. No evidence of pulmonary embolism. Mild cardiac enlargement. Aortic atherosclerosis and coronary artery calcifications. Mediastinum/Nodes: Thyroid gland and trachea are unremarkable. Mild increase caliber of the air-filled proximal esophagus is identified to the level of the carina. Below the carina there is focal dilatation of the thoracic esophagus containing food debris measuring 3.4 cm in diameter, image 87/6. This is compared with 1.3 cm above the carina. The distal esophagus just above the GE junction has a normal caliber. Underlying stricture formation cannot be excluded. No enlarged lymph nodes identified. Multiple calcified mediastinal and hilar lymph nodes are identified compatible with remote granulomatous disease. Lungs/Pleura: No pleural effusion, airspace consolidation or signs of pneumothorax. There is mild diffuse ground-glass attenuation identified throughout both lungs. Scattered calcified granulomas are noted bilaterally. Dependent changes noted within the posterior lung bases bilaterally. Musculoskeletal: Abnormal appearance of the lower thoracic spine at the T12-L1 level. There is complete erosion of the superior endplate of TI09and much of the anterior superior aspect of the T12 vertebral body. The inferior endplate of TB35also appears eroded. There is surrounding paravertebral soft tissue stranding, image 152/6. Retropulsion bone fragments identified into the canal, image 151/6. No discrete fluid collection within the limitations of CT. Imaging findings are compatible with discitis/osteomyelitis at T11-12. Review of the MIP images confirms the above findings. CT ABDOMEN and PELVIS FINDINGS Hepatobiliary: No suspicious liver lesions. Multiple calcified granulomas noted. Gallbladder appears normal. No bile duct dilatation. Pancreas: Fatty infiltration of the pancreas. No signs of main duct dilatation, inflammation or mass. Spleen: No suspicious  findings.  Numerous calcified granulomas. Adrenals/Urinary Tract: The adrenal glands are within normal limits. Bilateral renal cortical scarring and mild volume loss. Right kidney stone measures 3 mm. Benign Bosniak class 1 cyst arises off the inferior pole of the right kidney. No follow-up imaging recommended. No hydronephrosis identified bilaterally. Urinary bladder appears normal. Stomach/Bowel: Stomach is within normal limits. No bowel wall thickening, inflammation or distension. Sigmoid diverticulosis without signs of acute inflammation. Vascular/Lymphatic: Aortic atherosclerosis. No aneurysm. No signs of abdominopelvic adenopathy. Reproductive: Prostate is unremarkable. Other: No free fluid or fluid collections identified. Musculoskeletal: Postoperative changes from L4-5 hardware fixation with solid fusion noted at L3-4 and L4-5. The disc space at L2-3 is indistinct with erosive in plate changes which are also suspicious for discitis/osteomyelitis. No significant surrounding inflammatory changes noted. Review of the MIP images confirms the above findings. IMPRESSION: 1. No evidence for acute pulmonary embolus. 2. Signs of acute discitis/osteomyelitis at T11-12 with significant endplate destruction of both vertebral bodies and retropulsion of bony fragment into the ventral spinal canal. Surrounding soft tissue stranding is identified. 3. Mild discitis/osteomyelitis is also suspected at the L2-3 level. Here, the disc space is indistinct with mild erosive changes involving the endplates. No significant surrounding inflammatory fat stranding. 4. Mild diffuse ground-glass attenuation  identified throughout both lungs which may reflect mild pulmonary edema. 5. There is abnormal dilatation of the proximal and midthoracic esophagus. This is most dilated just below the level of the carina. The distal segment of the esophagus has a normal caliber. Cannot exclude distal esophageal stricture. 6. Sequelae of remote  granulomatous disease. 7. Nonobstructing right renal calculus. 8. Sigmoid diverticulosis without signs of acute inflammation. 9. Aortic Atherosclerosis (ICD10-I70.0). Critical Value/emergent results were called by telephone at the time of interpretation on 06/08/2022 at 12:44 pm to provider Michaele Amundson [facility] , who verbally acknowledged these results. Electronically Signed   By: Kerby Moors M.D.   On: 06/08/2022 12:45   CT Head Wo Contrast  Result Date: 06/08/2022 CLINICAL DATA:  Delirium.  Altered mental status. EXAM: CT HEAD WITHOUT CONTRAST TECHNIQUE: Contiguous axial images were obtained from the base of the skull through the vertex without intravenous contrast. RADIATION DOSE REDUCTION: This exam was performed according to the departmental dose-optimization program which includes automated exposure control, adjustment of the mA and/or kV according to patient size and/or use of iterative reconstruction technique. COMPARISON:  07/22/2021 FINDINGS: Brain: There is no evidence for acute hemorrhage, hydrocephalus, mass lesion, or abnormal extra-axial fluid collection. No definite CT evidence for acute infarction. Diffuse loss of parenchymal volume is consistent with atrophy. Patchy low attenuation in the deep hemispheric and periventricular white matter is nonspecific, but likely reflects chronic microvascular ischemic demyelination. Tiny lacunar infarcts in the basal ganglia bilaterally. Right cerebellar lacunar infarct is stable in the interval. Vascular: No hyperdense vessel or unexpected calcification. Skull: No evidence for fracture. No worrisome lytic or sclerotic lesion. Sinuses/Orbits: The visualized paranasal sinuses and mastoid air cells are clear. Visualized portions of the globes and intraorbital fat are unremarkable. Other: None. IMPRESSION: 1. No acute intracranial abnormality. 2. Atrophy with chronic small vessel ischemic disease. Electronically Signed   By: Misty Stanley M.D.   On: 06/08/2022 12:12    DG Chest Portable 1 View  Result Date: 06/08/2022 CLINICAL DATA:  Shortness of breath. Hypoxia. Altered mental status. EXAM: PORTABLE CHEST 1 VIEW COMPARISON:  03/06/2017 FINDINGS: Chronic cardiomegaly. Pacemaker with lead in the region of the right ventricle. The lungs are clear without infiltrate, collapse or effusion. Few scattered calcified granulomas. IMPRESSION: No active disease.  Cardiomegaly and pacemaker. Electronically Signed   By: Nelson Chimes M.D.   On: 06/08/2022 10:28    Pertinent labs & imaging results that were available during my care of the patient were reviewed by me and considered in my medical decision making (see MDM for details).  Medications Ordered in ED Medications  piperacillin-tazobactam (ZOSYN) IVPB 3.375 g (3.375 g Intravenous New Bag/Given 06/08/22 1314)  vancomycin (VANCOREADY) IVPB 1500 mg/300 mL (has no administration in time range)  lactated ringers bolus 1,000 mL (0 mLs Intravenous Stopped 06/08/22 1317)  iohexol (OMNIPAQUE) 350 MG/ML injection 80 mL (80 mLs Intravenous Contrast Given 06/08/22 1204)  diltiazem (CARDIZEM) injection 10 mg (10 mg Intravenous Given 06/08/22 1253)  fentaNYL (SUBLIMAZE) injection 50 mcg (50 mcg Intravenous Given 06/08/22 1314)  Procedures .Critical Care  Performed by: Teressa Lower, MD Authorized by: Teressa Lower, MD   Critical care provider statement:    Critical care time (minutes):  30   Critical care was time spent personally by me on the following activities:  Development of treatment plan with patient or surrogate, discussions with consultants, evaluation of patient's response to treatment, examination of patient, ordering and review of laboratory studies, ordering and review of radiographic studies, ordering and performing treatments and interventions, pulse oximetry, re-evaluation of  patient's condition and review of old charts   (including critical care time)  Medical Decision Making / ED Course   This patient presents to the ED for concern of altered mental status, this involves an extensive number of treatment options, and is a complaint that carries with it a high risk of complications and morbidity.  The differential diagnosis includes sepsis, intracranial hemorrhage, electrolyte abnormality, metabolic encephalopathy, polypharmacy, substance use  MDM: Patient seen emergency room for evaluation of altered mental status.  Physical exam reveals an ill-appearing cachectic patient with tenderness in the T and L-spine as well as a irregular tachycardia but is otherwise unremarkable.  Patient is disoriented and nonparticipatory in exam.  Laboratory evaluation with normal lactic acid, pH 7.40 with no hypercarbia, urinalysis with proteinuria but is otherwise unremarkable and negative for infection, creatinine with mild elevation of 1.74, BNP elevated to 15.4, no leukocytosis on CBC, initial chest x-ray unremarkable.  Due to altered mental status, significant tachycardia and possible hypertensive emergency, broad imaging work-up performed with CT head, CT PE and CT abdomen pelvis with contrast.  CT head unremarkable.  CT imaging with no PE but does show acute discitis/osteomyelitis at T11-T12 with significant endplate destruction and retropulsion of the bony fragment into the ventral spinal canal, there is also mild discitis/osteomyelitis at L2-L3, abnormal dilatation of the proximal and mid thoracic esophagus that may indicate distal esophageal stricture.  Patient has a pacemaker and cannot receive an MRI to further evaluate the osteomyelitis/discitis and thus I consulted neurosurgery and spoke with Dr. Wallis Bamberg who states that if there is concern for cord involvement, patient will have to receive a CT myelogram.  I initially placed orders for this imaging study, but given neurosurgery  statement of surgical futility based on patient's age and risk factors, I had a discussion with radiology and we agreed to not pursue the scan until tomorrow morning and instead hope that mental status improves so that we can obtain a better neurologic exam and determine if there truly is any evidence of cord involvement.  Patient then admitted to the hospitalist on broad-spectrum antibiotics for his osteomyelitis.   Additional history obtained: -Additional history obtained from wife -External records from outside source obtained and reviewed including: Chart review including previous notes, labs, imaging, consultation notes   Lab Tests: -I ordered, reviewed, and interpreted labs.   The pertinent results include:   Labs Reviewed  COMPREHENSIVE METABOLIC PANEL - Abnormal; Notable for the following components:      Result Value   Glucose, Bld 163 (*)    Creatinine, Ser 1.74 (*)    GFR, Estimated 38 (*)    All other components within normal limits  BRAIN NATRIURETIC PEPTIDE - Abnormal; Notable for the following components:   B Natriuretic Peptide 215.4 (*)    All other components within normal limits  URINALYSIS, ROUTINE W REFLEX MICROSCOPIC - Abnormal; Notable for the following components:   Color, Urine STRAW (*)    Glucose, UA 50 (*)  Ketones, ur 5 (*)    Protein, ur >=300 (*)    All other components within normal limits  PROTIME-INR - Abnormal; Notable for the following components:   Prothrombin Time 15.3 (*)    All other components within normal limits  I-STAT VENOUS BLOOD GAS, ED - Abnormal; Notable for the following components:   pO2, Ven 30 (*)    Bicarbonate 29.1 (*)    Acid-Base Excess 4.0 (*)    All other components within normal limits  URINE CULTURE  CULTURE, BLOOD (ROUTINE X 2)  CULTURE, BLOOD (ROUTINE X 2)  CBC WITH DIFFERENTIAL/PLATELET  TSH  LACTIC ACID, PLASMA  LACTIC ACID, PLASMA  APTT  BLOOD GAS, VENOUS  TROPONIN I (HIGH SENSITIVITY)  TROPONIN I (HIGH  SENSITIVITY)      EKG   EKG Interpretation  Date/Time:  Wednesday June 08 2022 09:36:16 EDT Ventricular Rate:  108 PR Interval:    QRS Duration: 124 QT Interval:  368 QTC Calculation: 493 R Axis:   88 Text Interpretation: Atrial fibrillation Right bundle branch block Abnormal ECG When compared with ECG of 22-Mar-2017 21:48, PREVIOUS ECG IS PRESENT Confirmed by Sherrill Mckamie ([id number]) on 06/08/2022 9:51:56 AM         Imaging Studies ordered: I ordered imaging studies including head CT to, chest x-ray, CT PE, CT abdomen pelvis with contrast I independently visualized and interpreted imaging. I agree with the radiologist interpretation   Medicines ordered and prescription drug management: Meds ordered this encounter  Medications   lactated ringers bolus 1,000 mL   DISCONTD: diltiazem (CARDIZEM) injection 5 mg   iohexol (OMNIPAQUE) 350 MG/ML injection 80 mL   DISCONTD: nicardipine (CARDENE) '20mg'$  in 0.86% saline 255m IV infusion (0.1 mg/ml)   diltiazem (CARDIZEM) injection 10 mg   piperacillin-tazobactam (ZOSYN) IVPB 3.375 g    Order Specific Question:   Antibiotic Indication:    Answer:   Wound Infection   vancomycin (VANCOREADY) IVPB 1500 mg/300 mL    Order Specific Question:   Indication:    Answer:   Wound Infection   fentaNYL (SUBLIMAZE) injection 50 mcg    -I have reviewed the patients home medicines and have made adjustments as needed  Critical interventions Antibiotics, hypertension control, rate control  Consultations Obtained: I requested consultation with the neurosurgeon on-call,  and discussed lab and imaging findings as well as pertinent plan - they recommend: CT myelogram if exam concerning   Cardiac Monitoring: The patient was maintained on a cardiac monitor.  I personally viewed and interpreted the cardiac monitored which showed an underlying rhythm of: A-fib with RVR  Social Determinants of Health:  Factors impacting patients care include:  none   Reevaluation: After the interventions noted above, I reevaluated the patient and found that they have :improved  Co morbidities that complicate the patient evaluation  Past Medical History:  Diagnosis Date   [place]    permanent   Backache, unspecified    Cellulitis and abscess of leg, except foot    CHF ([place]estive heart failure) [place]    Diverticulosis 05-2006   Dr. KDeatra Ina   Esophageal stricture    Fatigue    GERD (gastroesophageal reflux disease)    Gout, unspecified    Hx of colonic polyps 05-2006   (Adenomatous)Dr. KDeatra Ina  Hx of radiation therapy 06/16/14- 07/18/14   mult squamous cell carcinomas of scalp   Hypertension    Long term (current) use of anticoagulants    Overweight(278.02)    Pacemaker- [employer] 02/20/2013   Phlebitis and thrombophlebitis of superficial vessels of lower extremities    Right bundle branch block    Skin cancer    squamous cell carcinoma scalp   Syncope and collapse    Tachycardia-bradycardia (HCC)    s/p PPM   Type II or unspecified type diabetes mellitus without mention of complication, not stated as uncontrolled    Unspecified venous (peripheral) insufficiency       Dispostion: I considered admission for this patient, and given persistent altered mental status and concern for osteomyelitis, patient will be admitted     Final Clinical Impression(s) / ED Diagnoses Final diagnoses:  None     '@PCDICTATION'$ @    Teressa Lower, MD 06/08/22 2132

## 2022-06-08 NOTE — Assessment & Plan Note (Addendum)
--  A1C 6.4 --CBG stable. Continue SSI. Has not needed long-acting insulin at home.

## 2022-06-08 NOTE — Assessment & Plan Note (Addendum)
--  LVEF 14-70%, diastolic function could not be determined.  --no obvious valve abnormalities

## 2022-06-08 NOTE — ED Notes (Incomplete)
Help get patient changed new sheets on bed place a brief

## 2022-06-08 NOTE — H&P (Signed)
History and Physical    Patient: [name] UXL:[id number] DOB: 07-19-1935 DOA: 06/08/2022 DOS: the patient was seen and examined on 06/08/2022 PCP: Seward Carol, MD  Patient coming from: Home - lives with wife. He uses a WC with his right BKA.    Chief Complaint: AMS  HPI: [name] is a 87 y.o. male with medical history significant of HTN, PAF on eliquis, Z3GU, diastolic CHF, SSS s/p St. Jude PPM in 06/2011, GERD/esophgaeal stricture who presented to ED with complaints of AMS. History from his wife.  He was last known well until 5pm last night. He went to his car to get some groceries out of the car and when he didn't come back in, his wife went to check on him. He looked out of it and didn't really respond. He was just staring at the basket. He wouldn't speak or just repeat a word over and over. He wouldn't get out of his WC into his bed. He went into the bathroom and opened all of the cabinet doors. He was unable to get up and use the bathroom like he normally does.  She was able to get him back into other room. She waited until security guard was on to call 911 as she wouldn't be able to leave him and let someone in the building. She called first thing at [facility]. She states he has not been sick and has been doing well. NO fever/chills. Has been functioning and talking until 5pm yesterday.   Baseline: he can transfer out of his WC on his own into bed, chair, toilet. She does help with changing clothes. He is able to fix his own meals. He does not drive.     He does not smoke or drink.   ER Course:  vitals: afebrile, bp: 154/102, HR: 106, RR: 16, oxygen: 99%RA Pertinent labs: creatinine 1.74 (1.4-1.5), bnp 215,  Venous bg: pH 7.408,  CXR: stable, no acute finding CT head: no acute finding.  CTA chest: no PE. Signs of acute discitis/osteomyelitis at T11-12 with significant endplate destruction of both vertebral bodies and retropulsion of bony fragment into the ventral spinal  canal. Surrounding soft tissue stranding is identified. 3. Mild discitis/osteomyelitis is also suspected at the L2-3 level. Here, the disc space is indistinct with mild erosive changes involving the endplates. No significant surrounding inflammatory fat stranding. In ED: cultures obtained. Started on vanc/zosyn. Given 1L IVF bolus and 65m cardizem. Neurosurgery consulted and [facility] asked to admit.      Review of Systems: unable to review all systems due to the inability of the patient to answer questions. Past Medical History:  Diagnosis Date   Atrial fibrillation ([place]    permanent   Backache, unspecified    Cellulitis and abscess of leg, except foot    CHF (congestive heart failure) ([place]    Diverticulosis 05-2006   Dr. KDeatra Ina   Esophageal stricture    Fatigue    GERD (gastroesophageal reflux disease)    Gout, unspecified    Hx of colonic polyps 05-2006   (Adenomatous)Dr. KDeatra Ina  Hx of radiation therapy 06/16/14- 07/18/14   mult squamous cell carcinomas of scalp   Hypertension    Long term (current) use of anticoagulants    Overweight(278.02)    Pacemaker- [name] 02/20/2013   Phlebitis and thrombophlebitis of superficial vessels of lower extremities    Right bundle branch block    Skin cancer    squamous cell carcinoma scalp   Syncope  and collapse    Tachycardia-bradycardia ([place])    s/p PPM   Type II or unspecified type diabetes mellitus without mention of complication, not stated as uncontrolled    Unspecified venous (peripheral) insufficiency    Past Surgical History:  Procedure Laterality Date   ABOVE KNEE LEG AMPUTATION Right 04/02/2014   DR DUDA   AMPUTATION Right 01/20/2014   Procedure: AMPUTATION BELOW KNEE- right;  Surgeon: [name], MD;  Location: [facility];  Service: Orthopedics;  Laterality: Right;  Right Below Knee Amputation   AMPUTATION Right 02/14/2014   Procedure: AMPUTATION BELOW KNEE;  Surgeon: [name], MD;  Location: [facility];  Service: Orthopedics;   Laterality: Right;  Right Below Knee Amputation Revision   AMPUTATION Right 03/07/2014   Procedure: AMPUTATION BELOW KNEE;  Surgeon: [name], MD;  Location: [facility];  Service: Orthopedics;  Laterality: Right;  Right Below Knee Amputation Revision   AMPUTATION Right 03/18/2014   Procedure: revision of  AMPUTATION BELOW KNEE;  Surgeon: [name], MD;  Location: [facility];  Service: Orthopedics;  Laterality: Right;  Revision Right Below Knee Amputation   AMPUTATION Right 04/02/2014   Procedure: AMPUTATION ABOVE KNEE;  Surgeon: [name], MD;  Location: [facility];  Service: Orthopedics;  Laterality: Right;  Right Above Knee Amputation   EYE SURGERY     I & D EXTREMITY Right 01/17/2014   Procedure: IRRIGATION AND DEBRIDEMENT RIGHT HEEL  WITH CULTURES AND BONE BIOPSY, placement of wound vac;  Surgeon: Theodoro Kos, DO;  Location: [facility];  Service: Plastics;  Laterality: Right;   LAMINECTOMY     LUMBAR FUSION     PACEMAKER INSERTION  8/12   SJM by [name] for tachy/brady syndrome   ROTATOR CUFF REPAIR     ruptured rt rectus muscle     SKIN BIOPSY     scalp   Social History:  reports that he has quit smoking. He has never used smokeless tobacco. He reports that he does not drink alcohol and does not use drugs.  Allergies  Allergen Reactions   Morphine Rash and Other (See Comments)    Irritability also   Statins Other (See Comments)    dizzy    Family History  Problem Relation Age of Onset   Pneumonia Mother    Heart disease Father    Diabetes Father    Colon cancer Neg Hx     Prior to Admission medications   Medication Sig Start Date End Date Taking? Authorizing Provider  ACCU-CHEK SOFTCLIX LANCETS lancets Use to check blood sugar three times daily. Dx E11.9 04/26/17   Mariea Clonts, Tiffany L, DO  apixaban (ELIQUIS) 5 MG TABS tablet Take 5 mg by mouth 2 (two) times daily.     [provider]  B-D ULTRAFINE III SHORT PEN 31G X 8 MM MISC USE AS DIRECTED 02/20/18   Reed, Tiffany L, DO   Blood Glucose Calibration (ACCU-CHEK AVIVA) SOLN Use as Directed. Dx E11.9 05/01/17   Reed, Tiffany L, DO  cephALEXin (KEFLEX) 500 MG capsule TAKE (1) CAPSULE THREE TIMES DAILY. 09/02/20   Wallene Huh, DPM  cephALEXin (KEFLEX) 500 MG capsule Take 1 capsule (500 mg total) by mouth 3 (three) times daily. 09/24/20   Wallene Huh, DPM  cephALEXin (KEFLEX) 500 MG capsule Take 1 capsule (500 mg total) by mouth 2 (two) times daily. 02/25/21   Wallene Huh, DPM  cephALEXin (KEFLEX) 500 MG capsule Take 1 capsule (500 mg total)  by mouth 3 (three) times daily. 05/27/21   Wallene Huh, DPM  diltiazem (CARDIZEM CD) 180 MG 24 hr capsule 180 mg daily.  01/07/19   [provider]  furosemide (LASIX) 20 MG tablet Take 20 mg by mouth every other day.    [provider]  gabapentin (NEURONTIN) 100 MG capsule Take 100 mg by mouth 2 (two) times daily.    [provider]  glucose blood (ACCU-CHEK AVIVA PLUS) test strip DX E11.9, check blood sugar three times daily 04/18/17   Reed, Tiffany L, DO  hydrocortisone 2.5 % ointment Apply topically as needed. 08/05/20   [provider]  Insulin Glargine (LANTUS SOLOSTAR [place]) Inject into the skin as needed.     [provider]  insulin lispro (HUMALOG) 100 UNIT/ML KiwkPen Inject into the skin as needed.     [provider]  melatonin 5 MG TABS at bedtime.    [provider]  Multiple Vitamins-Minerals (MULTIVITAMIN PO) Take 1 tablet by mouth daily. Takes a Men's 50 Plus Multivitamin    [provider]  omeprazole (PRILOSEC) 20 MG capsule Take 20 mg by mouth daily.    [provider]  Oxycodone HCl 10 MG TABS Take 1 tablet (10 mg total) by mouth every 8 (eight) hours as needed. For pain 08/15/17   Gildardo Cranker, DO    Physical Exam: Vitals:   06/08/22 1745 06/08/22 1758 06/08/22 1800 06/08/22 1832  BP: (!) 180/96  (!) 185/92 (!) 196/105  Pulse: 86  83 87  Resp: _0 Temp:  97.8 F  (36.6 C)    TempSrc:  Oral    SpO2: 99%  98% 99%  Weight:      Height:       General:  Appears calm and comfortable and is in NAD. Sleeping, minimally interactive. Cachetic  Eyes:  PERRL, EOMI, normal lids, iris ENT:  grossly normal hearing, lips & tongue, dry mucous membranes; appropriate dentition Neck:  no LAD, masses or thyromegaly; no carotid bruits Cardiovascular:  regular rate, irregular rhythm, no m/r/g. No LE edema. Right BKA.  Respiratory:   CTA bilaterally with no wheezes/rales/rhonchi.  Normal respiratory effort. Abdomen:  soft, NT, ND, NABS Back:   normal alignment, no CVAT Skin:  no rash or induration seen on limited exam. Multiple AK on right ear and scalp.  Musculoskeletal:  can not assess, does spontaneously move bilateral UE. Does not move his LE.  Lower extremity:  No LE edema.  Limited foot exam with no ulcerations on left leg. Right BKA.  Psychiatric:  can not test.  Neurologic:  can not test    Radiological Exams on Admission: Independently reviewed - see discussion in A/P where applicable  ECHOCARDIOGRAM COMPLETE  Result Date: 06/08/2022    ECHOCARDIOGRAM REPORT   Patient Name:   [name] Date of Exam: 06/08/2022 Medical Rec #:  [id number]        Height:       70.0 in Accession #:    [id number]       Weight:       160.0 lb Date of Birth:  1935/05/17        BSA:          1.898 m Patient Age:    63 years         BP:           214/126 mmHg Patient Gender: M  HR:           84 bpm. Exam Location:  Inpatient Procedure: 2D Echo, Color Doppler and Cardiac Doppler Indications:    "Discitis of multiple sites of the spine"  History:        Patient has prior history of Echocardiogram examinations, most                 recent 03/05/2014. CHF, Pacemaker, Arrythmias:Atrial                 Fibrillation; Risk Factors:Hypertension, Diabetes and                 Dyslipidemia.  Sonographer:    Raquel Sarna Senior RDCS Referring Phys: [id number] [place]  1. Left  ventricular ejection fraction, by estimation, is 60 to 65%. The left ventricle has normal function. The left ventricle has no regional wall motion abnormalities. Diastolic function indeterminant due to AFib.  2. Right ventricular systolic function is mildly reduced. The right ventricular size is normal. There is normal pulmonary artery systolic pressure. The estimated right ventricular systolic pressure is 54.6 mmHg.  3. Left atrial size was severely dilated.  4. Right atrial size was mildly dilated.  5. The mitral valve is degenerative. Moderate mitral valve regurgitation. Severe mitral annular calcification.  6. Tricuspid valve regurgitation is moderate.  7. The aortic valve is tricuspid. There is moderate calcification of the aortic valve. There is moderate thickening of the aortic valve. Aortic valve regurgitation is trivial. Aortic valve sclerosis/calcification is present, without any evidence of aortic stenosis.  8. The inferior vena cava is normal in size with greater than 50% respiratory variability, suggesting right atrial pressure of 3 mmHg.  9. No valvular vegetations visualized, however, degree of MAC makes it difficult to fully interrogate mitral valve. Recommend TEE if clinically indicated. Comparison(s): No significant change from prior study in 2015. Conclusion(s)/Recommendation(s): No evidence of valvular vegetations on this transthoracic echocardiogram. Consider a transesophageal echocardiogram to exclude infective endocarditis if clinically indicated. FINDINGS  Left Ventricle: Left ventricular ejection fraction, by estimation, is 60 to 65%. The left ventricle has normal function. The left ventricle has no regional wall motion abnormalities. The left ventricular internal cavity size was normal in size. There is  no left ventricular hypertrophy. Diastolic function indeterminant due to AFib. Right Ventricle: The right ventricular size is normal. No increase in right ventricular wall thickness. Right  ventricular systolic function is mildly reduced. There is normal pulmonary artery systolic pressure. The tricuspid regurgitant velocity is 2.80 m/s, and with an assumed right atrial pressure of 3 mmHg, the estimated right ventricular systolic pressure is 56.8 mmHg. Left Atrium: Left atrial size was severely dilated. Right Atrium: Right atrial size was mildly dilated. Pericardium: There is no evidence of pericardial effusion. Mitral Valve: The mitral valve is degenerative in appearance. There is mild thickening of the mitral valve leaflet(s). There is mild calcification of the mitral valve leaflet(s). Severe mitral annular calcification. Moderate mitral valve regurgitation. MV peak gradient, 13.2 mmHg. The mean mitral valve gradient is 4.0 mmHg. Tricuspid Valve: The tricuspid valve is normal in structure. Tricuspid valve regurgitation is moderate. Aortic Valve: The aortic valve is tricuspid. There is moderate calcification of the aortic valve. There is moderate thickening of the aortic valve. Aortic valve regurgitation is trivial. Aortic valve sclerosis/calcification is present, without any evidence of aortic stenosis. Pulmonic Valve: The pulmonic valve was normal in structure. Pulmonic valve regurgitation is trivial. Aorta: The aortic root and ascending aorta are structurally  normal, with no evidence of dilitation. Venous: The inferior vena cava is normal in size with greater than 50% respiratory variability, suggesting right atrial pressure of 3 mmHg. IAS/Shunts: The atrial septum is grossly normal. Additional Comments: A device lead is visualized.  LEFT VENTRICLE PLAX 2D LVIDd:         4.55 cm   Diastology LVIDs:         2.85 cm   LV e' medial:    7.51 cm/s LV PW:         1.05 cm   LV E/e' medial:  22.6 LV IVS:        0.80 cm   LV e' lateral:   8.05 cm/s LVOT diam:     2.00 cm   LV E/e' lateral: 21.1 LV SV:         45 LV SV Index:   23 LVOT Area:     3.14 cm  RIGHT VENTRICLE RV S prime:     5.33 cm/s TAPSE  (M-mode): 1.4 cm LEFT ATRIUM              Index        RIGHT ATRIUM           Index LA diam:        5.80 cm  3.06 cm/m   RA Area:     21.40 cm LA Vol (A2C):   136.0 ml 71.64 ml/m  RA Volume:   52.40 ml  27.60 ml/m LA Vol (A4C):   131.5 ml 69.27 ml/m LA Biplane Vol: 132.0 ml 69.53 ml/m  AORTIC VALVE LVOT Vmax:   66.40 cm/s LVOT Vmean:  49.200 cm/s LVOT VTI:    0.142 m  AORTA Ao Root diam: 3.40 cm Ao Asc diam:  3.30 cm MITRAL VALVE                TRICUSPID VALVE MV Area (PHT): 4.01 cm     TR Peak grad:   31.4 mmHg MV Area VTI:   1.26 cm     TR Vmax:        280.00 cm/s MV Peak grad:  13.2 mmHg MV Mean grad:  4.0 mmHg     SHUNTS MV Vmax:       1.82 m/s     Systemic VTI:  0.14 m MV Vmean:      95.5 cm/s    Systemic Diam: 2.00 cm MV Decel Time: 189 msec MV E velocity: 170.00 cm/s MV A velocity: 73.20 cm/s MV E/A ratio:  2.32 Gwyndolyn Kaufman MD Electronically signed by Gwyndolyn Kaufman MD Signature Date/Time: 06/08/2022/3:50:54 PM    Final    CT Angio Chest PE W and/or Wo Contrast  Result Date: 06/08/2022 CLINICAL DATA:  High probability for acute pulmonary embolus. Sepsis. EXAM: CT ANGIOGRAPHY CHEST CT ABDOMEN AND PELVIS WITH CONTRAST TECHNIQUE: Multidetector CT imaging of the chest was performed using the standard protocol during bolus administration of intravenous contrast. Multiplanar CT image reconstructions and MIPs were obtained to evaluate the vascular anatomy. Multidetector CT imaging of the abdomen and pelvis was performed using the standard protocol during bolus administration of intravenous contrast. RADIATION DOSE REDUCTION: This exam was performed according to the departmental dose-optimization program which includes automated exposure control, adjustment of the mA and/or kV according to patient size and/or use of iterative reconstruction technique. CONTRAST:  32m OMNIPAQUE IOHEXOL 350 MG/ML SOLN COMPARISON:  CT AP from 09/20/18 FINDINGS: CTA CHEST FINDINGS Cardiovascular: Satisfactory  opacification of the pulmonary arteries to  the segmental level. No evidence of pulmonary embolism. Mild cardiac enlargement. Aortic atherosclerosis and coronary artery calcifications. Mediastinum/Nodes: Thyroid gland and trachea are unremarkable. Mild increase caliber of the air-filled proximal esophagus is identified to the level of the carina. Below the carina there is focal dilatation of the thoracic esophagus containing food debris measuring 3.4 cm in diameter, image 87/6. This is compared with 1.3 cm above the carina. The distal esophagus just above the GE junction has a normal caliber. Underlying stricture formation cannot be excluded. No enlarged lymph nodes identified. Multiple calcified mediastinal and hilar lymph nodes are identified compatible with remote granulomatous disease. Lungs/Pleura: No pleural effusion, airspace consolidation or signs of pneumothorax. There is mild diffuse ground-glass attenuation identified throughout both lungs. Scattered calcified granulomas are noted bilaterally. Dependent changes noted within the posterior lung bases bilaterally. Musculoskeletal: Abnormal appearance of the lower thoracic spine at the T12-L1 level. There is complete erosion of the superior endplate of F09 and much of the anterior superior aspect of the T12 vertebral body. The inferior endplate of N23 also appears eroded. There is surrounding paravertebral soft tissue stranding, image 152/6. Retropulsion bone fragments identified into the canal, image 151/6. No discrete fluid collection within the limitations of CT. Imaging findings are compatible with discitis/osteomyelitis at T11-12. Review of the MIP images confirms the above findings. CT ABDOMEN and PELVIS FINDINGS Hepatobiliary: No suspicious liver lesions. Multiple calcified granulomas noted. Gallbladder appears normal. No bile duct dilatation. Pancreas: Fatty infiltration of the pancreas. No signs of main duct dilatation, inflammation or mass. Spleen:  No suspicious findings.  Numerous calcified granulomas. Adrenals/Urinary Tract: The adrenal glands are within normal limits. Bilateral renal cortical scarring and mild volume loss. Right kidney stone measures 3 mm. Benign Bosniak class 1 cyst arises off the inferior pole of the right kidney. No follow-up imaging recommended. No hydronephrosis identified bilaterally. Urinary bladder appears normal. Stomach/Bowel: Stomach is within normal limits. No bowel wall thickening, inflammation or distension. Sigmoid diverticulosis without signs of acute inflammation. Vascular/Lymphatic: Aortic atherosclerosis. No aneurysm. No signs of abdominopelvic adenopathy. Reproductive: Prostate is unremarkable. Other: No free fluid or fluid collections identified. Musculoskeletal: Postoperative changes from L4-5 hardware fixation with solid fusion noted at L3-4 and L4-5. The disc space at L2-3 is indistinct with erosive in plate changes which are also suspicious for discitis/osteomyelitis. No significant surrounding inflammatory changes noted. Review of the MIP images confirms the above findings. IMPRESSION: 1. No evidence for acute pulmonary embolus. 2. Signs of acute discitis/osteomyelitis at T11-12 with significant endplate destruction of both vertebral bodies and retropulsion of bony fragment into the ventral spinal canal. Surrounding soft tissue stranding is identified. 3. Mild discitis/osteomyelitis is also suspected at the L2-3 level. Here, the disc space is indistinct with mild erosive changes involving the endplates. No significant surrounding inflammatory fat stranding. 4. Mild diffuse ground-glass attenuation identified throughout both lungs which may reflect mild pulmonary edema. 5. There is abnormal dilatation of the proximal and midthoracic esophagus. This is most dilated just below the level of the carina. The distal segment of the esophagus has a normal caliber. Cannot exclude distal esophageal stricture. 6. Sequelae of  remote granulomatous disease. 7. Nonobstructing right renal calculus. 8. Sigmoid diverticulosis without signs of acute inflammation. 9. Aortic Atherosclerosis (ICD10-I70.0). Critical Value/emergent results were called by telephone at the time of interpretation on 06/08/2022 at 12:44 pm to provider [facility] , who verbally acknowledged these results. Electronically Signed   By: Kerby Moors M.D.   On: 06/08/2022 12:45   CT  ABDOMEN PELVIS W CONTRAST  Result Date: 06/08/2022 CLINICAL DATA:  High probability for acute pulmonary embolus. Sepsis. EXAM: CT ANGIOGRAPHY CHEST CT ABDOMEN AND PELVIS WITH CONTRAST TECHNIQUE: Multidetector CT imaging of the chest was performed using the standard protocol during bolus administration of intravenous contrast. Multiplanar CT image reconstructions and MIPs were obtained to evaluate the vascular anatomy. Multidetector CT imaging of the abdomen and pelvis was performed using the standard protocol during bolus administration of intravenous contrast. RADIATION DOSE REDUCTION: This exam was performed according to the departmental dose-optimization program which includes automated exposure control, adjustment of the mA and/or kV according to patient size and/or use of iterative reconstruction technique. CONTRAST:  80m OMNIPAQUE IOHEXOL 350 MG/ML SOLN COMPARISON:  CT AP from 09/20/18 FINDINGS: CTA CHEST FINDINGS Cardiovascular: Satisfactory opacification of the pulmonary arteries to the segmental level. No evidence of pulmonary embolism. Mild cardiac enlargement. Aortic atherosclerosis and coronary artery calcifications. Mediastinum/Nodes: Thyroid gland and trachea are unremarkable. Mild increase caliber of the air-filled proximal esophagus is identified to the level of the carina. Below the carina there is focal dilatation of the thoracic esophagus containing food debris measuring 3.4 cm in diameter, image 87/6. This is compared with 1.3 cm above the carina. The distal esophagus  just above the GE junction has a normal caliber. Underlying stricture formation cannot be excluded. No enlarged lymph nodes identified. Multiple calcified mediastinal and hilar lymph nodes are identified compatible with remote granulomatous disease. Lungs/Pleura: No pleural effusion, airspace consolidation or signs of pneumothorax. There is mild diffuse ground-glass attenuation identified throughout both lungs. Scattered calcified granulomas are noted bilaterally. Dependent changes noted within the posterior lung bases bilaterally. Musculoskeletal: Abnormal appearance of the lower thoracic spine at the T12-L1 level. There is complete erosion of the superior endplate of TH37and much of the anterior superior aspect of the T12 vertebral body. The inferior endplate of TJ69also appears eroded. There is surrounding paravertebral soft tissue stranding, image 152/6. Retropulsion bone fragments identified into the canal, image 151/6. No discrete fluid collection within the limitations of CT. Imaging findings are compatible with discitis/osteomyelitis at T11-12. Review of the MIP images confirms the above findings. CT ABDOMEN and PELVIS FINDINGS Hepatobiliary: No suspicious liver lesions. Multiple calcified granulomas noted. Gallbladder appears normal. No bile duct dilatation. Pancreas: Fatty infiltration of the pancreas. No signs of main duct dilatation, inflammation or mass. Spleen: No suspicious findings.  Numerous calcified granulomas. Adrenals/Urinary Tract: The adrenal glands are within normal limits. Bilateral renal cortical scarring and mild volume loss. Right kidney stone measures 3 mm. Benign Bosniak class 1 cyst arises off the inferior pole of the right kidney. No follow-up imaging recommended. No hydronephrosis identified bilaterally. Urinary bladder appears normal. Stomach/Bowel: Stomach is within normal limits. No bowel wall thickening, inflammation or distension. Sigmoid diverticulosis without signs of acute  inflammation. Vascular/Lymphatic: Aortic atherosclerosis. No aneurysm. No signs of abdominopelvic adenopathy. Reproductive: Prostate is unremarkable. Other: No free fluid or fluid collections identified. Musculoskeletal: Postoperative changes from L4-5 hardware fixation with solid fusion noted at L3-4 and L4-5. The disc space at L2-3 is indistinct with erosive in plate changes which are also suspicious for discitis/osteomyelitis. No significant surrounding inflammatory changes noted. Review of the MIP images confirms the above findings. IMPRESSION: 1. No evidence for acute pulmonary embolus. 2. Signs of acute discitis/osteomyelitis at T11-12 with significant endplate destruction of both vertebral bodies and retropulsion of bony fragment into the ventral spinal canal. Surrounding soft tissue stranding is identified. 3. Mild discitis/osteomyelitis is also suspected at the  L2-3 level. Here, the disc space is indistinct with mild erosive changes involving the endplates. No significant surrounding inflammatory fat stranding. 4. Mild diffuse ground-glass attenuation identified throughout both lungs which may reflect mild pulmonary edema. 5. There is abnormal dilatation of the proximal and midthoracic esophagus. This is most dilated just below the level of the carina. The distal segment of the esophagus has a normal caliber. Cannot exclude distal esophageal stricture. 6. Sequelae of remote granulomatous disease. 7. Nonobstructing right renal calculus. 8. Sigmoid diverticulosis without signs of acute inflammation. 9. Aortic Atherosclerosis (ICD10-I70.0). Critical Value/emergent results were called by telephone at the time of interpretation on 06/08/2022 at 12:44 pm to provider M[facility] , who verbally acknowledged these results. Electronically Signed   By: Kerby Moors M.D.   On: 06/08/2022 12:45   CT Head Wo Contrast  Result Date: 06/08/2022 CLINICAL DATA:  Delirium.  Altered mental status. EXAM: CT HEAD WITHOUT  CONTRAST TECHNIQUE: Contiguous axial images were obtained from the base of the skull through the vertex without intravenous contrast. RADIATION DOSE REDUCTION: This exam was performed according to the departmental dose-optimization program which includes automated exposure control, adjustment of the mA and/or kV according to patient size and/or use of iterative reconstruction technique. COMPARISON:  07/22/2021 FINDINGS: Brain: There is no evidence for acute hemorrhage, hydrocephalus, mass lesion, or abnormal extra-axial fluid collection. No definite CT evidence for acute infarction. Diffuse loss of parenchymal volume is consistent with atrophy. Patchy low attenuation in the deep hemispheric and periventricular white matter is nonspecific, but likely reflects chronic microvascular ischemic demyelination. Tiny lacunar infarcts in the basal ganglia bilaterally. Right cerebellar lacunar infarct is stable in the interval. Vascular: No hyperdense vessel or unexpected calcification. Skull: No evidence for fracture. No worrisome lytic or sclerotic lesion. Sinuses/Orbits: The visualized paranasal sinuses and mastoid air cells are clear. Visualized portions of the globes and intraorbital fat are unremarkable. Other: None. IMPRESSION: 1. No acute intracranial abnormality. 2. Atrophy with chronic small vessel ischemic disease. Electronically Signed   By: Misty Stanley M.D.   On: 06/08/2022 12:12   DG Chest Portable 1 View  Result Date: 06/08/2022 CLINICAL DATA:  Shortness of breath. Hypoxia. Altered mental status. EXAM: PORTABLE CHEST 1 VIEW COMPARISON:  03/06/2017 FINDINGS: Chronic cardiomegaly. Pacemaker with lead in the region of the right ventricle. The lungs are clear without infiltrate, collapse or effusion. Few scattered calcified granulomas. IMPRESSION: No active disease.  Cardiomegaly and pacemaker. Electronically Signed   By: Nelson Chimes M.D.   On: 06/08/2022 10:28    EKG: Independently reviewed.  Atrial  fibrillation with rate 108; nonspecific ST changes with no evidence of acute ischemia. RBBB   Labs on Admission: I have personally reviewed the available labs and imaging studies at the time of the admission.  Pertinent labs:   creatinine 1.74 (1.4-1.5),  bnp 215,   Assessment and Plan: Principal Problem:   acute discitis/osteomyelitis of thoracic/lumbar region of spine  Active Problems:   Acute encephalopathy   Acute renal failure superimposed on stage 3b chronic kidney disease (HCC)   Chronic diastolic CHF (congestive heart failure) (HCC)   Atrial fibrillation (HCC)   Diabetes mellitus ([place])   Essential hypertension   GERD   ESOPHAGEAL STRICTURE   Hyperlipidemia    Assessment and Plan: * acute discitis/osteomyelitis of thoracic/lumbar region of spine  87 year old male presenting with acute onset altered mental status and back pain found to have acute discitis/osteomyelitis at T11-12 with significant endplate destruction and retropulsion of bony  fragment into ventral spinal canal. Mild discitis/OM suspected at L2-3 level.  -admit to telemetry -continue antibiotics with vanc/zosyn. Will change to cefepime/vanc -check inflammatory markers -no leukocytosis or fevers -check echo  -neurosurgery consulted -myelogram ordered for tomorrow. If he wakes up more and can move leg we can cancel this, but due to AMS can not confirm if he can move LE and concern for spinal cord compression  -bone biopsy per day team/ NS with clinical picture  -palliative care consult. Unlikely a surgical candidate.  -chronic pain per wife. On oxycodone BID (has been taking TID at times), muscle relaxers and gabapentin   Acute encephalopathy Presenting with altered mental status likely secondary to infection.  Metabolic work up thus far with no significant finding. B12/ammonia pending No leukocytosis, urine does not appear infected, but imaging with acute discitis/OM of thoracic/lumbar spine.  Blood  culture/urine culture pending-continue antibiotics and w/u for above.  Ct with no acute finding. Can not MRI brain with his PPM. Will try for frequent neuro checks. If no improvement, consider EEG.  Sleeping, but protecting airway. Keep NPO until more alert  Stroke swallow screen  Wife does state was taking 3 oxycodone a day and supposed to take 2 and his PCP discussed this with him. Unsure if this could be contributing as well. Holding for now, has dilaudid prn  Appears slightly dry, received 1L IVF in ED. Will give time limited, gentle IVF x 8 hours  In setting of known diastolic CHF    Acute renal failure superimposed on stage 3b chronic kidney disease (HCC) Baseline creatinine appears around 1.4-1.5 1.74 today Per wife has not hardly been eating much, likely pre renal component. Has >5 ketones on UA as well as NSAID use  UA Strict I/O  Hold nephrotoxic drugs/avoid (lasix/baclofen and on NSAID bid)  Trend renal function Gentle, time limited IVF x 8 hours   Chronic diastolic CHF (congestive heart failure) ([place]) 01/1539: EF 08%, diastolic function could not be determined.  bnp mildly elevated, but no signs of overt volume overload-appears dry on exam with ketones in urine and poor PO intake reported at home.  Will update echo is setting of acute discitis/osteomyelitis with unknown if has valvular disease since echo >8 years ago  Strict I/O and daily weights   Atrial fibrillation (HCC) cha2ds2-vasc of 7, continue eliquis, but renally adjust>can not safely swallow.. change to heparin for now  Per cardiology note has periods of rapid rate, but overall controlled.  Rate overall controlled. Given cardizem IV since NPO Metoprolol PRN IV while NPO   Diabetes mellitus ([place]) Updated A1C today Has not needed his long acting insulin for a while, per wife.  SSI and acchuchecks qac/hs   Essential hypertension Has been hypertensive, but better after cardizem Likely pain also driving  this Control pain and PRN metoprolol for elevated pressures Hopefully can transition to PO if he wakes up more to safely swallow.   GERD Continue PPI to IV   ESOPHAGEAL STRICTURE History of esophageal stricture and dysphagia.  Progressing poor PO intake Findings on CT. Can't exclude distal esophageal stricture If clinically improves, consider consulting GI.  SLP eval when more awake to participate  Continue PPI     Advance Care Planning:   Code Status: Full Code   Consults: neurosurgery: Dr. Shara Blazing care   DVT Prophylaxis: eliquis (renally adjust) >heparin gtt. Can not safely swallow.   Family Communication: updated his wife by phone: [name]: [phone]  Severity of Illness: The appropriate  patient status for this patient is INPATIENT. Inpatient status is judged to be reasonable and necessary in order to provide the required intensity of service to ensure the patient's safety. The patient's presenting symptoms, physical exam findings, and initial radiographic and laboratory data in the context of their chronic comorbidities is felt to place them at high risk for further clinical deterioration. Furthermore, it is not anticipated that the patient will be medically stable for discharge from the hospital within 2 midnights of admission.   * I certify that at the point of admission it is my clinical judgment that the patient will require inpatient hospital care spanning beyond 2 midnights from the point of admission due to high intensity of service, high risk for further deterioration and high frequency of surveillance required.*  Author: Orma Flaming, MD 06/08/2022 6:39 PM  For on call review [email or web address].

## 2022-06-08 NOTE — Progress Notes (Signed)
ANTICOAGULATION CONSULT NOTE - Initial Consult  Pharmacy Consult for heparin Indication: atrial fibrillation  Allergies  Allergen Reactions   Morphine Rash and Other (See Comments)    Irritability also    Patient Measurements: Height: '5\' 10"'$  (177.8 cm) Weight: 72.6 kg (160 lb) IBW/kg (Calculated) : 73 Heparin Dosing Weight: TBW  Vital Signs: Temp: 97.8 F (36.6 C) (07/19 1758) Temp Source: Oral (07/19 1758) BP: 185/92 (07/19 1800) Pulse Rate: 83 (07/19 1800)  Labs: Recent Labs    06/08/22 0928 06/08/22 1022 06/08/22 1124  HGB 13.0 13.3  --   HCT 41.0 39.0  --   PLT 303  --   --   APTT 32  --   --   LABPROT 15.3*  --   --   INR 1.2  --   --   CREATININE 1.74*  --   --   TROPONINIHS 11  --  11    Estimated Creatinine Clearance: 32.5 mL/min (A) (by C-G formula based on SCr of 1.74 mg/dL (H)).   Medical History: Past Medical History:  Diagnosis Date   Atrial fibrillation ([place])    permanent   Backache, unspecified    Cellulitis and abscess of leg, except foot    CHF (congestive heart failure) ([place])    Diverticulosis 05-2006   Dr. [name]    Esophageal stricture    Fatigue    GERD (gastroesophageal reflux disease)    Gout, unspecified    Hx of colonic polyps 05-2006   (Adenomatous)Dr. [name]   Hx of radiation therapy 06/16/14- 07/18/14   mult squamous cell carcinomas of scalp   Hypertension    Long term (current) use of anticoagulants    Overweight(278.02)    Pacemaker- [name] 02/20/2013   Phlebitis and thrombophlebitis of superficial vessels of lower extremities    Right bundle branch block    Skin cancer    squamous cell carcinoma scalp   Syncope and collapse    Tachycardia-bradycardia (HCC)    s/p PPM   Type II or unspecified type diabetes mellitus without mention of complication, not stated as uncontrolled    Unspecified venous (peripheral) insufficiency    Assessment: 45 YOM presenting with AMS, hx of afib on Eliquis PTA, failed swallow eval and  to transition to heparin gtt for now  Goal of Therapy:  Heparin level 0.3-0.7 units/ml aPTT 66-102 seconds Monitor platelets by anticoagulation protocol: Yes   Plan:  Heparin gtt at 1100 units/hr, no bolus F/u 8 hour aPTT/HL F/u swallow eval and ability to transition back to [place], PharmD Clinical Pharmacist ED Pharmacist Phone # [phone] 06/08/2022 6:26 PM

## 2022-06-08 NOTE — Assessment & Plan Note (Addendum)
monitor

## 2022-06-08 NOTE — Assessment & Plan Note (Addendum)
--   cannot get MRI secondary to pacemaker.  CT showedacute discitis/osteomyelitis at T11-12 with significant endplate destruction and retropulsion of bony fragment into ventral spinal canal. Mild discitis/OM suspected at L2-3 level.  However he has a history of back abnormalities on imaging and infection is somewhat less than definite. -- Much better able to comply with exam today.  Moves left leg well, right stump well.  Acute on chronic back pain noted. --Continue empiric antibiotics as per infectious disease.  I doubt cefepime playing a role given the patient's encephalopathy was present on admission. --Patient status post cortisone injections of the low back 7/14.  I gave her 67 notified orthopedic surgeon. --I discussed with Dr. Christella Noa this evening.  Clamshell brace would be best but he does not think the patient can tolerate, therefore he recommends TLSO and can mobilize.  He is not a candidate for any intervention at this point.  However if he substantially improves, he can follow-up in the office for consideration of surgical intervention. --Palliative care also involved.

## 2022-06-08 NOTE — Assessment & Plan Note (Addendum)
--   Bizarre affect= on admission, now much improved.  Follows commands, alert and oriented to self, location, month and year.  Still perseverates and has some word finding difficulties. -- No fever or history thereof.  No leukocytosis, initial imaging did not suggest infection other than in spine. -- CT head no acute abnormalities.  MRI not obtainable secondary to pacemaker. -- Was on oxycodone for chronic pain, could consider adverse drug reaction or accidental misuse, however presentation does not suggest narcotic effect. -- Spot EEG unremarkable.  Long-term video monitoring EEG in process.  [place] neurology.

## 2022-06-08 NOTE — Assessment & Plan Note (Addendum)
Continue PPI ?

## 2022-06-08 NOTE — ED Notes (Signed)
This RN attempted to obtain a swallow screen on the patient while Dr. Rogers Blocker is at bedside. Patient failed the swallow screen and will remain NPO per Dr. Rogers Blocker; patient not to get PO medications until he can pass his swallow screen.

## 2022-06-08 NOTE — Consult Note (Signed)
Reason for Consult:discitis, osteomyelitis Referring Physician: [name] is an 87 y.o. male.  HPI: whom was admitted by the hospitalist service for discitis  Past Medical History:  Diagnosis Date   Atrial fibrillation ([place])    permanent   Backache, unspecified    Cellulitis and abscess of leg, except foot    CHF (congestive heart failure) ([place])    Diverticulosis 05-2006   Dr. [name]    Esophageal stricture    Fatigue    GERD (gastroesophageal reflux disease)    Gout, unspecified    Hx of colonic polyps 05-2006   (Adenomatous)Dr. 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/p PPM   Type II or unspecified type diabetes mellitus without mention of complication, not stated as uncontrolled    Unspecified venous (peripheral) insufficiency     Past Surgical History:  Procedure Laterality Date   ABOVE KNEE LEG AMPUTATION Right 04/02/2014   DR DUDA   AMPUTATION Right 01/20/2014   Procedure: AMPUTATION BELOW KNEE- right;  Surgeon: [name], MD;  Location: [place];  Service: Orthopedics;  Laterality: Right;  Right Below Knee Amputation   AMPUTATION Right 02/14/2014   Procedure: AMPUTATION BELOW KNEE;  Surgeon: [name], MD;  Location: [place] [facility];  Service: Orthopedics;  Laterality: Right;  Right Below Knee Amputation Revision   AMPUTATION Right 03/07/2014   Procedure: AMPUTATION BELOW KNEE;  Surgeon: [name], MD;  Location: [facility];  Service: Orthopedics;  Laterality: Right;  Right Below Knee Amputation Revision   AMPUTATION Right 03/18/2014   Procedure: revision of  AMPUTATION BELOW  KNEE;  Surgeon: [name], MD;  Location: [facility];  Service: Orthopedics;  Laterality: Right;  Revision Right Below Knee Amputation   AMPUTATION Right 04/02/2014   Procedure: AMPUTATION ABOVE KNEE;  Surgeon: [name], MD;  Location: [facility];  Service: Orthopedics;  Laterality: Right;  Right Above Knee Amputation   EYE SURGERY     I & D EXTREMITY Right 01/17/2014   Procedure: IRRIGATION AND DEBRIDEMENT RIGHT HEEL  WITH CULTURES AND BONE BIOPSY, placement of wound vac;  Surgeon: Theodoro Kos, DO;  Location: [facility];  Service: Plastics;  Laterality: Right;   LAMINECTOMY     LUMBAR FUSION     PACEMAKER INSERTION  8/12   SJM by [name] for tachy/brady syndrome   ROTATOR CUFF REPAIR     ruptured rt rectus muscle     SKIN BIOPSY     scalp    Family History  Problem Relation Age of Onset   Pneumonia Mother    Heart disease Father    Diabetes Father    Colon cancer Neg Hx     Social History:  reports that he has quit smoking. He has never used smokeless tobacco. He reports that he does not drink alcohol and does not use drugs.  Allergies:  Allergies  Allergen Reactions   Morphine Rash and Other (See Comments)    Irritability also    Medications: I have reviewed the patient's current medications.  Results for orders placed or performed during the hospital encounter of 06/08/22 (from the past 48 hour(s))  Comprehensive metabolic panel     Status: Abnormal   Collection Time: 06/08/22  9:28 AM  Result Value Ref Range   Sodium 140 135 - 145 mmol/L   Potassium 3.6 3.5 - 5.1 mmol/L   Chloride 100 98 - 111 mmol/L   CO2 25 22 - 32 mmol/L   Glucose, Bld 163 (H) 70 - 99 mg/dL    Comment: Glucose reference range applies only to samples taken after fasting for at least 8 hours.   BUN 21 8 - 23 mg/dL   Creatinine, Ser 1.74 (H) 0.61 - 1.24 mg/dL   Calcium 10.1 8.9 - 10.3 mg/dL   Total Protein 7.2 6.5 - 8.1 g/dL   Albumin 3.7 3.5 - 5.0 g/dL   AST 16 15 - 41 U/L   ALT 14 0 - 44 U/L    Alkaline Phosphatase 103 38 - 126 U/L   Total Bilirubin 0.8 0.3 - 1.2 mg/dL   GFR, Estimated 38 (L) >60 mL/min    Comment: (NOTE) Calculated using the CKD-EPI Creatinine Equation (2021)    Anion gap 15 5 - 15    Comment: Performed at [facility] [address], [place], [place] [address]  Troponin I (High Sensitivity)     Status: None   Collection Time: 06/08/22  9:28 AM  Result Value Ref Range   Troponin I (High Sensitivity) 11 <18 ng/L    Comment: (NOTE) Elevated high sensitivity troponin I (hsTnI) values and significant  changes across serial measurements may suggest ACS but many other  chronic and acute conditions are known to elevate hsTnI results.  Refer to the "Links" section for chest pain algorithms and additional  guidance. Performed at [facility], [place] [address], [place], [place] [address]   CBC with Differential     Status: None   Collection Time: 06/08/22  9:28 AM  Result Value Ref Range   WBC 8.6 4.0 - 10.5 K/uL   RBC 4.77 4.22 - 5.81 MIL/uL   Hemoglobin 13.0 13.0 - 17.0 g/dL   HCT 41.0 39.0 - 52.0 %   MCV 86.0 80.0 - 100.0 fL   MCH 27.3 26.0 - 34.0 pg   MCHC 31.7 30.0 - 36.0 g/dL   RDW 14.3 11.5 - 15.5 %   Platelets 303 150 - 400 K/uL   nRBC 0.0 0.0 - 0.2 %   Neutrophils Relative % 79 %   Neutro Abs 6.9 1.7 - 7.7 K/uL   Lymphocytes Relative 14 %   Lymphs Abs 1.2 0.7 - 4.0 K/uL   Monocytes Relative 6 %   Monocytes Absolute 0.5 0.1 - 1.0 K/uL   Eosinophils Relative 0 %   Eosinophils Absolute 0.0 0.0 - 0.5 K/uL   Basophils Relative 0 %   Basophils Absolute 0.0 0.0 - 0.1 K/uL   Immature Granulocytes 1 %   Abs Immature Granulocytes 0.05 0.00 - 0.07 K/uL    Comment: Performed at [facility], [address], [place], [place] [address]  Brain natriuretic peptide     Status: Abnormal   Collection Time: 06/08/22  9:28 AM  Result Value Ref Range   B Natriuretic Peptide 215.4 (H) 0.0 - 100.0 pg/mL    Comment: Performed at [facility] [address] [place], [place] [address]  TSH     Status: None   Collection Time: 06/08/22  9:28 AM  Result Value Ref Range   TSH 3.461 0.350 - 4.500 uIU/mL    Comment: Performed by a 3rd Generation assay with a functional sensitivity of <=0.01 uIU/mL. Performed at [facility], [place] [address], [place], [place] [address]   Lactic acid, plasma     Status: None   Collection Time: 06/08/22  9:28 AM  Result Value Ref Range   Lactic Acid, Venous 1.3 0.5 - 1.9 mmol/L    Comment: Performed at [facility] [address], [place], [place] [address]  Protime-INR     Status: Abnormal   Collection Time: 06/08/22  9:28 AM  Result Value Ref Range   Prothrombin Time 15.3 (H) 11.4 - 15.2 seconds   INR 1.2 0.8 - 1.2    Comment: (NOTE) INR goal varies based on device and disease states. Performed at [facility], [place] [address] [place], [place] [address]   APTT     Status: None   Collection Time: 06/08/22  9:28 AM  Result Value Ref Range   aPTT 32 24 - 36 seconds    Comment: Performed at [facility] [address] [place], [place] [address]  Urinalysis, Routine w reflex microscopic Urine, In & Out Cath     Status: Abnormal   Collection Time: 06/08/22 10:00 AM  Result Value Ref Range   Color, Urine STRAW (A) YELLOW   APPearance CLEAR CLEAR   Specific Gravity, Urine 1.008 1.005 - 1.030   pH 7.0 5.0 - 8.0   Glucose, UA 50 (A) NEGATIVE mg/dL   Hgb urine dipstick NEGATIVE NEGATIVE   Bilirubin Urine NEGATIVE NEGATIVE   Ketones, ur 5 (A) NEGATIVE mg/dL   Protein, ur >=300 (A) NEGATIVE mg/dL   Nitrite NEGATIVE NEGATIVE   Leukocytes,Ua NEGATIVE NEGATIVE   RBC / HPF 0-5 0 - 5 RBC/hpf   Bacteria, UA NONE SEEN NONE SEEN    Comment: Performed at [facility] [address], [place], [place] [address]  I-Stat venous blood gas, ED     Status: Abnormal   Collection Time: 06/08/22 10:22 AM  Result Value Ref Range   pH, Ven 7.408 7.25 - 7.43   pCO2, Ven  46.1 44 - 60 mmHg   pO2, Ven 30 (LL) 32 - 45 mmHg   Bicarbonate 29.1 (H) 20.0 - 28.0 mmol/L   TCO2 30 22 - 32 mmol/L   O2 Saturation 57 %   Acid-Base Excess 4.0 (H) 0.0 - 2.0 mmol/L   Sodium 139 135 - 145 mmol/L   Potassium 3.5 3.5 - 5.1 mmol/L   Calcium, Ion 1.19 1.15 - 1.40 mmol/L   HCT 39.0 39.0 - 52.0 %   Hemoglobin 13.3 13.0 - 17.0 g/dL   Sample type VENOUS    Comment NOTIFIED PHYSICIAN   Lactic acid, plasma     Status: None   Collection Time: 06/08/22 11:24 AM  Result Value Ref Range   Lactic Acid, Venous 1.5 0.5 - 1.9 mmol/L    Comment: Performed at [facility] [address] [place], [place] [address]  Troponin I (High Sensitivity)     Status: None   Collection Time: 06/08/22 11:24 AM  Result Value Ref Range   Troponin I (High Sensitivity) 11 <18 ng/L    Comment: (NOTE) Elevated high sensitivity troponin I (hsTnI) values and significant  changes across serial measurements may suggest ACS but many other  chronic and acute conditions are known to elevate  hsTnI results.  Refer to the "Links" section for chest pain algorithms and additional  guidance. Performed at [facility], [place] [address] [place], [place] [address]   Hemoglobin A1c     Status: Abnormal   Collection Time: 06/08/22  2:57 PM  Result Value Ref Range   Hgb A1c MFr Bld 6.4 (H) 4.8 - 5.6 %    Comment: (NOTE) Pre diabetes:          5.7%-6.4%  Diabetes:              >6.4%  Glycemic control for   <7.0% adults with diabetes    Mean Plasma Glucose 136.98 mg/dL    Comment: Performed at [facility] [address] [place], [place] [address]    ECHOCARDIOGRAM COMPLETE  Result Date: 06/08/2022    ECHOCARDIOGRAM REPORT   Patient Name:   [name] Date of Exam: 06/08/2022 Medical Rec #:  [id number]        Height:       70.0 in Accession #:    [id number]       Weight:       160.0 lb Date of Birth:  13-Jun-1935        BSA:          1.898 m Patient Age:    17 years         BP:            214/126 mmHg Patient Gender: M                HR:           84 bpm. Exam Location:  Inpatient Procedure: 2D Echo, Color Doppler and Cardiac Doppler Indications:    "Discitis of multiple sites of the spine"  History:        Patient has prior history of Echocardiogram examinations, most                 recent 03/05/2014. CHF, Pacemaker, Arrythmias:Atrial                 Fibrillation; Risk Factors:Hypertension, Diabetes and                 Dyslipidemia.  Sonographer:    Raquel Sarna Senior RDCS Referring Phys: [id number] [place]  1. Left ventricular ejection fraction, by estimation, is 60 to 65%. The left ventricle has normal function. The left ventricle has no regional wall motion abnormalities. Diastolic function indeterminant due to AFib.  2. Right ventricular systolic function is mildly reduced. The right ventricular size is normal. There is normal pulmonary artery systolic pressure. The estimated right ventricular systolic pressure is 53.6 mmHg.  3. Left atrial size was severely dilated.  4. Right atrial size was mildly dilated.  5. The mitral valve is degenerative. Moderate mitral valve regurgitation. Severe mitral annular calcification.  6. Tricuspid valve regurgitation is moderate.  7. The aortic valve is tricuspid. There is moderate calcification of the aortic valve. There is moderate thickening of the aortic valve. Aortic valve regurgitation is trivial. Aortic valve sclerosis/calcification is present, without any evidence of aortic stenosis.  8. The inferior vena cava is normal in size with greater than 50% respiratory variability, suggesting right atrial pressure of 3 mmHg.  9. No valvular vegetations visualized, however, degree of MAC makes it difficult to fully interrogate mitral valve. Recommend TEE if clinically indicated. Comparison(s): No significant change from prior study in 2015. Conclusion(s)/Recommendation(s): No evidence of valvular vegetations  on this transthoracic echocardiogram.  Consider a transesophageal echocardiogram to exclude infective endocarditis if clinically indicated. FINDINGS  Left Ventricle: Left ventricular ejection fraction, by estimation, is 60 to 65%. The left ventricle has normal function. The left ventricle has no regional wall motion abnormalities. The left ventricular internal cavity size was normal in size. There is  no left ventricular hypertrophy. Diastolic function indeterminant due to AFib. Right Ventricle: The right ventricular size is normal. No increase in right ventricular wall thickness. Right ventricular systolic function is mildly reduced. There is normal pulmonary artery systolic pressure. The tricuspid regurgitant velocity is 2.80 m/s, and with an assumed right atrial pressure of 3 mmHg, the estimated right ventricular systolic pressure is 70.1 mmHg. Left Atrium: Left atrial size was severely dilated. Right Atrium: Right atrial size was mildly dilated. Pericardium: There is no evidence of pericardial effusion. Mitral Valve: The mitral valve is degenerative in appearance. There is mild thickening of the mitral valve leaflet(s). There is mild calcification of the mitral valve leaflet(s). Severe mitral annular calcification. Moderate mitral valve regurgitation. MV peak gradient, 13.2 mmHg. The mean mitral valve gradient is 4.0 mmHg. Tricuspid Valve: The tricuspid valve is normal in structure. Tricuspid valve regurgitation is moderate. Aortic Valve: The aortic valve is tricuspid. There is moderate calcification of the aortic valve. There is moderate thickening of the aortic valve. Aortic valve regurgitation is trivial. Aortic valve sclerosis/calcification is present, without any evidence of aortic stenosis. Pulmonic Valve: The pulmonic valve was normal in structure. Pulmonic valve regurgitation is trivial. Aorta: The aortic root and ascending aorta are structurally normal, with no evidence of dilitation. Venous: The inferior vena cava is normal in size with  greater than 50% respiratory variability, suggesting right atrial pressure of 3 mmHg. IAS/Shunts: The atrial septum is grossly normal. Additional Comments: A device lead is visualized.  LEFT VENTRICLE PLAX 2D LVIDd:         4.55 cm   Diastology LVIDs:         2.85 cm   LV e' medial:    7.51 cm/s LV PW:         1.05 cm   LV E/e' medial:  22.6 LV IVS:        0.80 cm   LV e' lateral:   8.05 cm/s LVOT diam:     2.00 cm   LV E/e' lateral: 21.1 LV SV:         45 LV SV Index:   23 LVOT Area:     3.14 cm  RIGHT VENTRICLE RV S prime:     5.33 cm/s TAPSE (M-mode): 1.4 cm LEFT ATRIUM              Index        RIGHT ATRIUM           Index LA diam:        5.80 cm  3.06 cm/m   RA Area:     21.40 cm LA Vol (A2C):   136.0 ml 71.64 ml/m  RA Volume:   52.40 ml  27.60 ml/m LA Vol (A4C):   131.5 ml 69.27 ml/m LA Biplane Vol: 132.0 ml 69.53 ml/m  AORTIC VALVE LVOT Vmax:   66.40 cm/s LVOT Vmean:  49.200 cm/s LVOT VTI:    0.142 m  AORTA Ao Root diam: 3.40 cm Ao Asc diam:  3.30 cm MITRAL VALVE                TRICUSPID VALVE MV Area (PHT): 4.01  cm     TR Peak grad:   31.4 mmHg MV Area VTI:   1.26 cm     TR Vmax:        280.00 cm/s MV Peak grad:  13.2 mmHg MV Mean grad:  4.0 mmHg     SHUNTS MV Vmax:       1.82 m/s     Systemic VTI:  0.14 m MV Vmean:      95.5 cm/s    Systemic Diam: 2.00 cm MV Decel Time: 189 msec MV E velocity: 170.00 cm/s MV A velocity: 73.20 cm/s MV E/A ratio:  2.32 Gwyndolyn Kaufman MD Electronically signed by Gwyndolyn Kaufman MD Signature Date/Time: 06/08/2022/3:50:54 PM    Final    CT Angio Chest PE W and/or Wo Contrast  Result Date: 06/08/2022 CLINICAL DATA:  High probability for acute pulmonary embolus. Sepsis. EXAM: CT ANGIOGRAPHY CHEST CT ABDOMEN AND PELVIS WITH CONTRAST TECHNIQUE: Multidetector CT imaging of the chest was performed using the standard protocol during bolus administration of intravenous contrast. Multiplanar CT image reconstructions and MIPs were obtained to evaluate the vascular  anatomy. Multidetector CT imaging of the abdomen and pelvis was performed using the standard protocol during bolus administration of intravenous contrast. RADIATION DOSE REDUCTION: This exam was performed according to the departmental dose-optimization program which includes automated exposure control, adjustment of the mA and/or kV according to patient size and/or use of iterative reconstruction technique. CONTRAST:  94m OMNIPAQUE IOHEXOL 350 MG/ML SOLN COMPARISON:  CT AP from 09/20/18 FINDINGS: CTA CHEST FINDINGS Cardiovascular: Satisfactory opacification of the pulmonary arteries to the segmental level. No evidence of pulmonary embolism. Mild cardiac enlargement. Aortic atherosclerosis and coronary artery calcifications. Mediastinum/Nodes: Thyroid gland and trachea are unremarkable. Mild increase caliber of the air-filled proximal esophagus is identified to the level of the carina. Below the carina there is focal dilatation of the thoracic esophagus containing food debris measuring 3.4 cm in diameter, image 87/6. This is compared with 1.3 cm above the carina. The distal esophagus just above the GE junction has a normal caliber. Underlying stricture formation cannot be excluded. No enlarged lymph nodes identified. Multiple calcified mediastinal and hilar lymph nodes are identified compatible with remote granulomatous disease. Lungs/Pleura: No pleural effusion, airspace consolidation or signs of pneumothorax. There is mild diffuse ground-glass attenuation identified throughout both lungs. Scattered calcified granulomas are noted bilaterally. Dependent changes noted within the posterior lung bases bilaterally. Musculoskeletal: Abnormal appearance of the lower thoracic spine at the T12-L1 level. There is complete erosion of the superior endplate of TF02and much of the anterior superior aspect of the T12 vertebral body. The inferior endplate of TO37also appears eroded. There is surrounding paravertebral soft tissue  stranding, image 152/6. Retropulsion bone fragments identified into the canal, image 151/6. No discrete fluid collection within the limitations of CT. Imaging findings are compatible with discitis/osteomyelitis at T11-12. Review of the MIP images confirms the above findings. CT ABDOMEN and PELVIS FINDINGS Hepatobiliary: No suspicious liver lesions. Multiple calcified granulomas noted. Gallbladder appears normal. No bile duct dilatation. Pancreas: Fatty infiltration of the pancreas. No signs of main duct dilatation, inflammation or mass. Spleen: No suspicious findings.  Numerous calcified granulomas. Adrenals/Urinary Tract: The adrenal glands are within normal limits. Bilateral renal cortical scarring and mild volume loss. Right kidney stone measures 3 mm. Benign Bosniak class 1 cyst arises off the inferior pole of the right kidney. No follow-up imaging recommended. No hydronephrosis identified bilaterally. Urinary bladder appears normal. Stomach/Bowel: Stomach is within normal limits. No  bowel wall thickening, inflammation or distension. Sigmoid diverticulosis without signs of acute inflammation. Vascular/Lymphatic: Aortic atherosclerosis. No aneurysm. No signs of abdominopelvic adenopathy. Reproductive: Prostate is unremarkable. Other: No free fluid or fluid collections identified. Musculoskeletal: Postoperative changes from L4-5 hardware fixation with solid fusion noted at L3-4 and L4-5. The disc space at L2-3 is indistinct with erosive in plate changes which are also suspicious for discitis/osteomyelitis. No significant surrounding inflammatory changes noted. Review of the MIP images confirms the above findings. IMPRESSION: 1. No evidence for acute pulmonary embolus. 2. Signs of acute discitis/osteomyelitis at T11-12 with significant endplate destruction of both vertebral bodies and retropulsion of bony fragment into the ventral spinal canal. Surrounding soft tissue stranding is identified. 3. Mild  discitis/osteomyelitis is also suspected at the L2-3 level. Here, the disc space is indistinct with mild erosive changes involving the endplates. No significant surrounding inflammatory fat stranding. 4. Mild diffuse ground-glass attenuation identified throughout both lungs which may reflect mild pulmonary edema. 5. There is abnormal dilatation of the proximal and midthoracic esophagus. This is most dilated just below the level of the carina. The distal segment of the esophagus has a normal caliber. Cannot exclude distal esophageal stricture. 6. Sequelae of remote granulomatous disease. 7. Nonobstructing right renal calculus. 8. Sigmoid diverticulosis without signs of acute inflammation. 9. Aortic Atherosclerosis (ICD10-I70.0). Critical Value/emergent results were called by telephone at the time of interpretation on 06/08/2022 at 12:44 pm to provider [facility] , who verbally acknowledged these results. Electronically Signed   By: Kerby Moors M.D.   On: 06/08/2022 12:45   CT ABDOMEN PELVIS W CONTRAST  Result Date: 06/08/2022 CLINICAL DATA:  High probability for acute pulmonary embolus. Sepsis. EXAM: CT ANGIOGRAPHY CHEST CT ABDOMEN AND PELVIS WITH CONTRAST TECHNIQUE: Multidetector CT imaging of the chest was performed using the standard protocol during bolus administration of intravenous contrast. Multiplanar CT image reconstructions and MIPs were obtained to evaluate the vascular anatomy. Multidetector CT imaging of the abdomen and pelvis was performed using the standard protocol during bolus administration of intravenous contrast. RADIATION DOSE REDUCTION: This exam was performed according to the departmental dose-optimization program which includes automated exposure control, adjustment of the mA and/or kV according to patient size and/or use of iterative reconstruction technique. CONTRAST:  43m OMNIPAQUE IOHEXOL 350 MG/ML SOLN COMPARISON:  CT AP from 09/20/18 FINDINGS: CTA CHEST FINDINGS  Cardiovascular: Satisfactory opacification of the pulmonary arteries to the segmental level. No evidence of pulmonary embolism. Mild cardiac enlargement. Aortic atherosclerosis and coronary artery calcifications. Mediastinum/Nodes: Thyroid gland and trachea are unremarkable. Mild increase caliber of the air-filled proximal esophagus is identified to the level of the carina. Below the carina there is focal dilatation of the thoracic esophagus containing food debris measuring 3.4 cm in diameter, image 87/6. This is compared with 1.3 cm above the carina. The distal esophagus just above the GE junction has a normal caliber. Underlying stricture formation cannot be excluded. No enlarged lymph nodes identified. Multiple calcified mediastinal and hilar lymph nodes are identified compatible with remote granulomatous disease. Lungs/Pleura: No pleural effusion, airspace consolidation or signs of pneumothorax. There is mild diffuse ground-glass attenuation identified throughout both lungs. Scattered calcified granulomas are noted bilaterally. Dependent changes noted within the posterior lung bases bilaterally. Musculoskeletal: Abnormal appearance of the lower thoracic spine at the T12-L1 level. There is complete erosion of the superior endplate of TQ33and much of the anterior superior aspect of the T12 vertebral body. The inferior endplate of TA07also appears eroded. There is surrounding paravertebral  soft tissue stranding, image 152/6. Retropulsion bone fragments identified into the canal, image 151/6. No discrete fluid collection within the limitations of CT. Imaging findings are compatible with discitis/osteomyelitis at T11-12. Review of the MIP images confirms the above findings. CT ABDOMEN and PELVIS FINDINGS Hepatobiliary: No suspicious liver lesions. Multiple calcified granulomas noted. Gallbladder appears normal. No bile duct dilatation. Pancreas: Fatty infiltration of the pancreas. No signs of main duct dilatation,  inflammation or mass. Spleen: No suspicious findings.  Numerous calcified granulomas. Adrenals/Urinary Tract: The adrenal glands are within normal limits. Bilateral renal cortical scarring and mild volume loss. Right kidney stone measures 3 mm. Benign Bosniak class 1 cyst arises off the inferior pole of the right kidney. No follow-up imaging recommended. No hydronephrosis identified bilaterally. Urinary bladder appears normal. Stomach/Bowel: Stomach is within normal limits. No bowel wall thickening, inflammation or distension. Sigmoid diverticulosis without signs of acute inflammation. Vascular/Lymphatic: Aortic atherosclerosis. No aneurysm. No signs of abdominopelvic adenopathy. Reproductive: Prostate is unremarkable. Other: No free fluid or fluid collections identified. Musculoskeletal: Postoperative changes from L4-5 hardware fixation with solid fusion noted at L3-4 and L4-5. The disc space at L2-3 is indistinct with erosive in plate changes which are also suspicious for discitis/osteomyelitis. No significant surrounding inflammatory changes noted. Review of the MIP images confirms the above findings. IMPRESSION: 1. No evidence for acute pulmonary embolus. 2. Signs of acute discitis/osteomyelitis at T11-12 with significant endplate destruction of both vertebral bodies and retropulsion of bony fragment into the ventral spinal canal. Surrounding soft tissue stranding is identified. 3. Mild discitis/osteomyelitis is also suspected at the L2-3 level. Here, the disc space is indistinct with mild erosive changes involving the endplates. No significant surrounding inflammatory fat stranding. 4. Mild diffuse ground-glass attenuation identified throughout both lungs which may reflect mild pulmonary edema. 5. There is abnormal dilatation of the proximal and midthoracic esophagus. This is most dilated just below the level of the carina. The distal segment of the esophagus has a normal caliber. Cannot exclude distal  esophageal stricture. 6. Sequelae of remote granulomatous disease. 7. Nonobstructing right renal calculus. 8. Sigmoid diverticulosis without signs of acute inflammation. 9. Aortic Atherosclerosis (ICD10-I70.0). Critical Value/emergent results were called by telephone at the time of interpretation on 06/08/2022 at 12:44 pm to provider [facility] , who verbally acknowledged these results. Electronically Signed   By: Kerby Moors M.D.   On: 06/08/2022 12:45   CT Head Wo Contrast  Result Date: 06/08/2022 CLINICAL DATA:  Delirium.  Altered mental status. EXAM: CT HEAD WITHOUT CONTRAST TECHNIQUE: Contiguous axial images were obtained from the base of the skull through the vertex without intravenous contrast. RADIATION DOSE REDUCTION: This exam was performed according to the departmental dose-optimization program which includes automated exposure control, adjustment of the mA and/or kV according to patient size and/or use of iterative reconstruction technique. COMPARISON:  07/22/2021 FINDINGS: Brain: There is no evidence for acute hemorrhage, hydrocephalus, mass lesion, or abnormal extra-axial fluid collection. No definite CT evidence for acute infarction. Diffuse loss of parenchymal volume is consistent with atrophy. Patchy low attenuation in the deep hemispheric and periventricular white matter is nonspecific, but likely reflects chronic microvascular ischemic demyelination. Tiny lacunar infarcts in the basal ganglia bilaterally. Right cerebellar lacunar infarct is stable in the interval. Vascular: No hyperdense vessel or unexpected calcification. Skull: No evidence for fracture. No worrisome lytic or sclerotic lesion. Sinuses/Orbits: The visualized paranasal sinuses and mastoid air cells are clear. Visualized portions of the globes and intraorbital fat are unremarkable. Other: None. IMPRESSION: 1.  No acute intracranial abnormality. 2. Atrophy with chronic small vessel ischemic disease. Electronically Signed    By: Misty Stanley M.D.   On: 06/08/2022 12:12   DG Chest Portable 1 View  Result Date: 06/08/2022 CLINICAL DATA:  Shortness of breath. Hypoxia. Altered mental status. EXAM: PORTABLE CHEST 1 VIEW COMPARISON:  03/06/2017 FINDINGS: Chronic cardiomegaly. Pacemaker with lead in the region of the right ventricle. The lungs are clear without infiltrate, collapse or effusion. Few scattered calcified granulomas. IMPRESSION: No active disease.  Cardiomegaly and pacemaker. Electronically Signed   By: Nelson Chimes M.D.   On: 06/08/2022 10:28    Review of Systems  Unable to perform ROS: Mental status change   Blood pressure (!) 198/87, pulse 82, temperature 98 F (36.7 C), resp. rate 13, height _0  (1.778 m), weight 72.6 kg, SpO2 100 %. Physical Exam Constitutional:      Appearance: He is normal weight.  HENT:     Head: Normocephalic and atraumatic.     Mouth/Throat:     Mouth: Mucous membranes are moist.     Pharynx: Oropharynx is clear.  Eyes:     Extraocular Movements: Extraocular movements intact.     Pupils: Pupils are equal, round, and reactive to light.  Neurological:     Mental Status: He is lethargic.     Sensory: Sensation is intact.     Motor: Weakness present.     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 If concern for canal compromise, recommend myelogram and post myelo CT As it stands he is a very poor candidate for operative fixation and stabilization. Will need a firm diagnosis of the organism, and close monitoring of progress. Recommend ID consult once organism identified. Should remain on bedrest.   Ashok Pall 06/08/2022, 4:02 PM

## 2022-06-08 NOTE — Assessment & Plan Note (Addendum)
--  History of esophageal stricture and dysphagia. Can't exclude distal esophageal stricture on CT; if clinically improves, consider consulting GI.  Improved mentation.  Diet per speech therapy.

## 2022-06-08 NOTE — Progress Notes (Signed)
Echocardiogram 2D Echocardiogram has been performed.  Oneal Deputy Marsel Gail RDCS 06/08/2022, 3:43 PM

## 2022-06-08 NOTE — Assessment & Plan Note (Addendum)
--  Baseline creatinine appears around 1.4-1.5 --appears stable now

## 2022-06-08 NOTE — Progress Notes (Signed)
Pharmacy Antibiotic Note  [name] is a 87 y.o. male admitted on 06/08/2022 with  wound infection . Pharmacy has been consulted for vancomycin and zosyn dosing. Pt if afebrile and WBC is WNL. SCr is elevated at 1.74, slightly above baseline.   Plan: Vancomcyin '1500mg'$  IV x 1 then '750mg'$  IV Q24H Zosyn 3.375gm IV Q8H (4 hr inf) F/u renal fxn, C&S, clinical status and peak/trough at SS    Temp (24hrs), Avg:98.6 F (37 C), Min:98.6 F (37 C), Max:98.6 F (37 C)  Recent Labs  Lab 06/08/22 0928  WBC 8.6  CREATININE 1.74*  LATICACIDVEN 1.3    CrCl cannot be calculated (Unknown ideal weight.).    Allergies  Allergen Reactions   Morphine Rash and Other (See Comments)    Irritability also    Antimicrobials this admission: Vanc 7/19>> Zosyn 7/19>>  Dose adjustments this admission: [name]  Microbiology results: Pending  Thank you for allowing pharmacy to be a part of this patient's care.  Maranatha Grossi, Rande Lawman 06/08/2022 12:51 PM

## 2022-06-08 NOTE — ED Triage Notes (Signed)
PT arrived via [facility] from home with AMS.  Wife stated to EMS the patient was normal yesterday until around 5pm where he became slumped over.  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 Ems reports he just states yes and thank you.  Fire said he was initially 84%/RA, then placed on 02/2L and increased to 88%. Then EMS placed on NRB. Currently RA 100%.  Pt states he is in pain. Pt does answer to name.  BP for EMS 220/104.  CBG 176.  No recent falls.

## 2022-06-08 NOTE — Assessment & Plan Note (Addendum)
--  Cha2ds2-vasc of 7, will hold apixaban until workup and treatment plan clear --Rate control with metoprolol.  Heparin.

## 2022-06-09 ENCOUNTER — Inpatient Hospital Stay (HOSPITAL_COMMUNITY): Payer: Medicare Other

## 2022-06-09 DIAGNOSIS — G9341 Metabolic encephalopathy: Secondary | ICD-10-CM

## 2022-06-09 DIAGNOSIS — R4182 Altered mental status, unspecified: Secondary | ICD-10-CM

## 2022-06-09 DIAGNOSIS — Z711 Person with feared health complaint in whom no diagnosis is made: Secondary | ICD-10-CM

## 2022-06-09 DIAGNOSIS — Z789 Other specified health status: Secondary | ICD-10-CM

## 2022-06-09 DIAGNOSIS — K222 Esophageal obstruction: Secondary | ICD-10-CM

## 2022-06-09 DIAGNOSIS — E089 Diabetes mellitus due to underlying condition without complications: Secondary | ICD-10-CM

## 2022-06-09 DIAGNOSIS — Z7189 Other specified counseling: Secondary | ICD-10-CM

## 2022-06-09 DIAGNOSIS — N179 Acute kidney failure, unspecified: Secondary | ICD-10-CM

## 2022-06-09 DIAGNOSIS — M869 Osteomyelitis, unspecified: Secondary | ICD-10-CM

## 2022-06-09 DIAGNOSIS — R638 Other symptoms and signs concerning food and fluid intake: Secondary | ICD-10-CM

## 2022-06-09 DIAGNOSIS — Z515 Encounter for palliative care: Secondary | ICD-10-CM

## 2022-06-09 DIAGNOSIS — I5032 Chronic diastolic (congestive) heart failure: Secondary | ICD-10-CM

## 2022-06-09 DIAGNOSIS — M4646 Discitis, unspecified, lumbar region: Secondary | ICD-10-CM | POA: Diagnosis not present

## 2022-06-09 DIAGNOSIS — Z66 Do not resuscitate: Secondary | ICD-10-CM

## 2022-06-09 LAB — CBC
HCT: 36.6 % — ABNORMAL LOW (ref 39.0–52.0)
Hemoglobin: 12.1 g/dL — ABNORMAL LOW (ref 13.0–17.0)
MCH: 27.6 pg (ref 26.0–34.0)
MCHC: 33.1 g/dL (ref 30.0–36.0)
MCV: 83.4 fL (ref 80.0–100.0)
Platelets: 295 10*3/uL (ref 150–400)
RBC: 4.39 MIL/uL (ref 4.22–5.81)
RDW: 14.6 % (ref 11.5–15.5)
WBC: 10.3 10*3/uL (ref 4.0–10.5)
nRBC: 0 % (ref 0.0–0.2)

## 2022-06-09 LAB — BASIC METABOLIC PANEL
Anion gap: 12 (ref 5–15)
BUN: 21 mg/dL (ref 8–23)
CO2: 22 mmol/L (ref 22–32)
Calcium: 9.2 mg/dL (ref 8.9–10.3)
Chloride: 106 mmol/L (ref 98–111)
Creatinine, Ser: 1.7 mg/dL — ABNORMAL HIGH (ref 0.61–1.24)
GFR, Estimated: 39 mL/min — ABNORMAL LOW (ref >60)
Glucose, Bld: 151 mg/dL — ABNORMAL HIGH (ref 70–99)
Potassium: 3.2 mmol/L — ABNORMAL LOW (ref 3.5–5.1)
Sodium: 140 mmol/L (ref 135–145)

## 2022-06-09 LAB — URINE CULTURE: Culture: NO GROWTH

## 2022-06-09 LAB — GLUCOSE, CAPILLARY
Glucose-Capillary: 134 mg/dL — ABNORMAL HIGH (ref 70–99)
Glucose-Capillary: 123 mg/dL — ABNORMAL HIGH (ref 70–99)
Glucose-Capillary: 110 mg/dL — ABNORMAL HIGH (ref 70–99)

## 2022-06-09 LAB — CBG MONITORING, ED: Glucose-Capillary: 141 mg/dL — ABNORMAL HIGH (ref 70–99)

## 2022-06-09 LAB — HEPARIN LEVEL (UNFRACTIONATED): Heparin Unfractionated: >1.1 IU/mL — ABNORMAL HIGH (ref 0.30–0.70)

## 2022-06-09 LAB — APTT
aPTT: 169 seconds (ref 24–36)
aPTT: 88 seconds — ABNORMAL HIGH (ref 24–36)

## 2022-06-09 MED ORDER — HYDROMORPHONE HCL 1 MG/ML IJ SOLN
1.0000 mg | INTRAMUSCULAR | Status: DC | PRN
  Administered 2022-06-09 – 2022-06-11 (×9): 1 mg via INTRAVENOUS
  Filled 2022-06-09 (×9): qty 1

## 2022-06-09 MED ORDER — HYDROMORPHONE HCL 1 MG/ML IJ SOLN
0.5000 mg | Freq: Once | INTRAMUSCULAR | Status: AC
  Administered 2022-06-09: 0.5 mg via INTRAVENOUS
  Filled 2022-06-09: qty 0.5

## 2022-06-09 MED ORDER — LORAZEPAM 2 MG/ML IJ SOLN
0.2500 mg | Freq: Once | INTRAMUSCULAR | Status: AC
  Administered 2022-06-10: 0.25 mg via INTRAVENOUS
  Filled 2022-06-09: qty 1

## 2022-06-09 MED ORDER — METOPROLOL TARTRATE 5 MG/5ML IV SOLN
10.0000 mg | Freq: Four times a day (QID) | INTRAVENOUS | Status: DC | PRN
  Administered 2022-06-14: 10 mg via INTRAVENOUS
  Filled 2022-06-09 (×2): qty 10

## 2022-06-09 MED ORDER — HEPARIN (PORCINE) 25000 UT/250ML-% IV SOLN
800.0000 [IU]/h | INTRAVENOUS | Status: DC
  Administered 2022-06-09 – 2022-06-10 (×2): 800 [IU]/h via INTRAVENOUS
  Filled 2022-06-09 (×2): qty 250

## 2022-06-09 MED ORDER — METOPROLOL TARTRATE 5 MG/5ML IV SOLN: 10.0000 mg | Freq: Four times a day (QID) | INTRAVENOUS | Status: DC | PRN

## 2022-06-09 NOTE — ED Notes (Addendum)
Per Pharmacy, RN to turn off Heparin for pt r/t to aPTT 169. RN to turn back on at 7 with dose change.

## 2022-06-09 NOTE — Progress Notes (Signed)
ANTICOAGULATION CONSULT NOTE - Follow Up Consult  Pharmacy Consult for heparin Indication: atrial fibrillation  Labs: Recent Labs    06/08/22 0928 06/08/22 1022 06/08/22 1124 06/09/22 0359  HGB 13.0 13.3  --  12.1*  HCT 41.0 39.0  --  36.6*  PLT 303  --   --  295  APTT 32  --   --  169*  LABPROT 15.3*  --   --   --   INR 1.2  --   --   --   HEPARINUNFRC  --   --   --  >1.10*  CREATININE 1.74*  --   --  1.70*  TROPONINIHS 11  --  11  --     Assessment: 86yo male supratherapeutic on heparin with initial dosing while PO AC on hold; no infusion issues or signs of bleeding per RN.  Goal of Therapy:  aPTT 66-102 seconds   Plan:  Will hold heparin infusion x1h then decrease heparin infusion by 4 units/kg/hr to 800 units/hr and check PTT in 8 hours.    Wynona Neat, PharmD, BCPS  06/09/2022,5:54 AM

## 2022-06-09 NOTE — Progress Notes (Addendum)
Progress Note   Patient: [name] ZOX:[id number] DOB: 04/26/1935 DOA: 06/08/2022     1 DOS: the patient was seen and examined on 06/09/2022   Brief hospital course: 87 year old man lives at home with his wife, PMH right AKA, wheelchair dependent, mentally sharp, presented with sudden onset of confusion and worsening low back pain.  Work-up revealed acute discitis and osteomyelitis of the thoracic and lumbar spine.  On empiric antibiotics, neurosurgery, neurology, infectious disease and palliative medicine involved in this complex case.  Assessment and Plan: * Acute discitis/osteomyelitis of thoracic/lumbar region of spine  -- cannot get MRI secondary to pacemaker.  CT showedacute discitis/osteomyelitis at T11-12 with significant endplate destruction and retropulsion of bony fragment into ventral spinal canal. Mild discitis/OM suspected at L2-3 level.  --Exam is limited secondary to mental status, left leg does appear to be weak.  Moves both arms to command. --Continue empiric antibiotics.  Will consult infectious disease. --Patient status post cortisone injections of the low back 7/14.  Have notified orthopedic surgeon. --Further management per neurosurgery and infectious disease.  Although the patient is wheelchair-bound at baseline, he is able to transfer normally by himself and is mentally sharp. --Palliative care also involved.  Acute metabolic encephalopathy -- Bizarre affect.  Awake and alert but does not answer any question appropriately.  Constantly repeats phrases.  Able to follow some simple commands. -- No fever or history thereof.  No leukocytosis, initial imaging did not suggest infection other than in spine. -- CT head no acute abnormalities.  MRI not obtainable secondary to pacemaker. -- Was on oxycodone for chronic pain, could consider adverse drug reaction or accidental misuse, however presentation does not suggest narcotic effect. --Will check EEG.  Will consult  neurology for further recommendations.  No signs or symptoms to suggest meningitis.   AKI (acute kidney injury) ([place]) superimposed on CKD stage IIIb --Baseline creatinine appears around 1.4-1.5 --appears stable now  Chronic diastolic CHF (congestive heart failure) (HCC) --LVEF 81-19%, diastolic function could not be determined.  --no obvious valve abnormalities  Atrial fibrillation (HCC) --Cha2ds2-vasc of 7, will hold apixaban until workup and treatment plan clear --Rate control with metoprolol   Diabetes mellitus ([place]) --A1C 6.4 --SSI. Has not needed long-acting insulin at home.  Essential hypertension --monitor  GERD Continue PPI to IV   ESOPHAGEAL STRICTURE --History of esophageal stricture and dysphagia. Can't exclude distal esophageal stricture on CT; if clinically improves, consider consulting GI. NPO for now given mental status   GOC discussed with wife, she understands severe illness, she reports he would want to be DNR. Code status updated.     Subjective:  Patient unable to provide any history.  Per wife patient is wheelchair-bound, able to transfer by himself, alert, and a good conversationalist.  No recent illness but did have low back injection 7/14 for low back pain.  Suddenly 7/18 developed confusion, brought to the emergency department 719 for ongoing issues.  Physical Exam: Vitals:   06/09/22 1034 06/09/22 1036 06/09/22 1200 06/09/22 1235  BP: (!) 182/133  (!) 172/117 (!) 186/116  Pulse: (!) 122  (!) 110 (!) 110  Resp: '20  20 14  '$ Temp: 98.3 F (36.8 C) 98.7 F (37.1 C) 98.5 F (36.9 C) 97.9 F (36.6 C)  TempSrc: Oral Axillary Axillary Oral  SpO2: 98%  99% 100%  Weight:      Height:       Physical Exam Vitals reviewed.  Constitutional:      General: He is not in  acute distress.    Appearance: He is ill-appearing. He is not toxic-appearing.  Cardiovascular:     Rate and Rhythm: Normal rate and regular rhythm.     Heart sounds: No murmur  heard. Pulmonary:     Effort: Pulmonary effort is normal. No respiratory distress.     Breath sounds: No wheezing or rales.  Abdominal:     General: Abdomen is flat.     Palpations: Abdomen is soft.     Tenderness: There is no abdominal tenderness.  Musculoskeletal:     Right lower leg: No edema.     Left lower leg: No edema (left AKA).     Comments: Moves right AKA stump without difficulty. LLE is quite weak, able to wiggle a bit. Painful to touch.  Neurological:     Mental Status: He is alert. He is disoriented.     Comments: Difficult and limited exam. Asked name, location, marital status, if he has kids, where he is; all answers inappropriate, and constantly repeating "I am not joking"  Psychiatric:     Comments: Cannot assess mood; affect is bizarre.      Data Reviewed:  CBG stable Creatinine stable 1.70 Imaging noted  Family Communication: wife by phone 20 minutes  Disposition: Status is: Inpatient Remains inpatient appropriate because: acute infection, acute metabolic encephalopathy   Planned Discharge Destination:  TBD    Time spent: 50 minutes  Author: Murray Hodgkins, MD 06/09/2022 2:17 PM  For on call review [email or web address].

## 2022-06-09 NOTE — Progress Notes (Signed)
ANTICOAGULATION CONSULT NOTE  Pharmacy Consult for Heparin Indication: atrial fibrillation  Allergies  Allergen Reactions   Morphine Rash and Other (See Comments)    Irritability also   Statins Other (See Comments)    dizzy    Patient Measurements: Height: '5\' 10"'$  (177.8 cm) Weight: 72.6 kg (160 lb) IBW/kg (Calculated) : 73  Heparin Dosing Weight: 73 kg  Vital Signs: Temp: 98.6 F (37 C) (07/20 1430) Temp Source: Oral (07/20 1430) BP: 192/95 (07/20 1430) Pulse Rate: 97 (07/20 1430)  Labs: Recent Labs    06/08/22 0928 06/08/22 1022 06/08/22 1124 06/09/22 0359 06/09/22 1439  HGB 13.0 13.3  --  12.1*  --   HCT 41.0 39.0  --  36.6*  --   PLT 303  --   --  295  --   APTT 32  --   --  169* 88*  LABPROT 15.3*  --   --   --   --   INR 1.2  --   --   --   --   HEPARINUNFRC  --   --   --  >1.10*  --   CREATININE 1.74*  --   --  1.70*  --   TROPONINIHS 11  --  11  --   --     Estimated Creatinine Clearance: 33.2 mL/min (A) (by C-G formula based on SCr of 1.7 mg/dL (H)).   Assessment: 89 YOM with medical history significant for afib on Eliquis PTA who presented with AMS and failed swallow evaluation. Pharmacy consulted to manage heparin while apixaban on hold.  Will monitor with aPTT, anticipate heparin level will be falsely elevated due to recent apixaban dose. aPTT within goal range at 88 on heparin 800 units/hr. No issues with heparin infusion or signs of bleeding reported. CBC wnl.   Goal of Therapy:  Heparin level 0.3-0.7 units/ml aPTT 66-102 seconds Monitor platelets by anticoagulation protocol: Yes   Plan:  Continue heparin infusion at 800 units/hr Check heparin level in 8 hours and daily while on heparin Continue to monitor H&H and platelets    Thank you for allowing pharmacy to be a part of this patient's care.  Ardyth Harps, PharmD Clinical Pharmacist

## 2022-06-09 NOTE — Progress Notes (Signed)
Pt having severe agitaiton.. continually calling out in hallway for water, pain.. prn meds given.. provider to put in prn orders

## 2022-06-09 NOTE — Consult Note (Signed)
Neurology Consultation  Reason for Consult: Altered mental status Referring Physician: Sarajane Jews  CC: Altered Mental Status  History is obtained from:Patient, Chart Review, Family  HPI:  [name] is a 87 y.o. male with a past medical history of HTN, PAF on eliquis, [id number], diastolic CHF, right above-the-knee amputation, sick sinus syndrome s/p St. Jude PPM in 2013, GERD/esophageal stricture who presented to ED with with altered mental status. At baseline he is wheelchair-bound, but he can transfer into the bed, chair, toilet independently.  His wife does help him change his clothing.  He is able to fix his own meals and he does not drive. On 7/14 he had cortisol injections by Dr. Mardelle Matte for acute/chronic back pain. On 7/18 he had a sudden onset of altered mental status and confusion. He presented to the ED on 7/19.  He was last seen well at 5 PM on 7/18, when he went to his car to get some groceries and did not come back in.  When his wife went to check on him he looked out of it and did not respond appropriately.  He would not speak he was just repeating the same words over and over again.  He was unable to get out of his wheelchair into the bed and he was unable to get up to use the bathroom like he would normally.  She was able to get him back into the room and waited until his security guard was on to call 911 as she would not be able to leave him to let somebody into the building.  She states that he has not been sick recently and denies any abnormal activity prior to 5 PM. Upon entrance to the room he is perseverating and stating "don't come in, I really don't want you to come in [place] or [place]" and he is states "don't come in [place], you are hurting me". He does not follow commands but he will mimic for me. He asked if he is in pain he does say "yes" and then perseverates on that stating "yes I hurt bad, yes I hurt bad" He did not follow commands for me but will mimic. Tracks examiner and  blinks to threat bilaterally.  Unable to obtain MR Brain due to pacemaker Neurosurgery, ID, and Palliative are consulted as well   ROS: Unable to obtain due to altered mental status.   Past Medical History:  Diagnosis Date   Atrial fibrillation ([place])    permanent   Backache, unspecified    Cellulitis and abscess of leg, except foot    CHF (congestive heart failure) ([place])    Diverticulosis 05-2006   Dr. [name]    Esophageal stricture    Fatigue    GERD (gastroesophageal reflux disease)    Gout, unspecified    Hx of colonic polyps 05-2006   (Adenomatous)Dr. [name]   Hx of radiation therapy 06/16/14- 07/18/14   mult squamous cell carcinomas of scalp   Hypertension    Long term (current) use of anticoagulants    Overweight(278.02)    Pacemaker- [name] 02/20/2013   Phlebitis and thrombophlebitis of superficial vessels of lower extremities    Right bundle branch block    Skin cancer    squamous cell carcinoma scalp   Syncope and collapse    Tachycardia-bradycardia (HCC)    s/p PPM   Type II or unspecified type diabetes mellitus without mention of complication, not stated as uncontrolled    Unspecified venous (peripheral) insufficiency  Family History  Problem Relation Age of Onset   Pneumonia Mother    Heart disease Father    Diabetes Father    Colon cancer Neg Hx      Social History:   reports that he has quit smoking. He has never used smokeless tobacco. He reports that he does not drink alcohol and does not use drugs.  Medications  Current Facility-Administered Medications:    acetaminophen (TYLENOL) tablet 650 mg, 650 mg, Oral, Q6H PRN **OR** acetaminophen (TYLENOL) suppository 650 mg, 650 mg, Rectal, Q6H PRN, Orma Flaming, MD   ceFEPIme (MAXIPIME) 2 g in sodium chloride 0.9 % 100 mL IVPB, 2 g, Intravenous, Q12H, Bertis Ruddy, RPH, Stopped at 06/09/22 1022   diltiazem (CARDIZEM CD) 24 hr capsule 180 mg, 180 mg, Oral, Daily, Orma Flaming, MD   gabapentin  (NEURONTIN) capsule 100 mg, 100 mg, Oral, BID, Orma Flaming, MD   heparin ADULT infusion 100 units/mL (25000 units/277m), 800 Units/hr, Intravenous, Continuous, BLaren Everts RPH, Last Rate: 8 mL/hr at 06/09/22 0658, 800 Units/hr at 06/09/22 0658   HYDROmorphone (DILAUDID) injection 0.5 mg, 0.5 mg, Intravenous, Q3H PRN, WOrma Flaming MD, 0.5 mg at 06/09/22 0854   insulin aspart (novoLOG) injection 0-9 Units, 0-9 Units, Subcutaneous, TID WC, WOrma Flaming MD, 1 Units at 06/09/22 0851   metoprolol tartrate (LOPRESSOR) injection 5 mg, 5 mg, Intravenous, Q6H PRN, WOrma Flaming MD, 5 mg at 06/08/22 2003   pantoprazole (PROTONIX) injection 40 mg, 40 mg, Intravenous, Q24H, WOrma Flaming MD, 40 mg at 06/08/22 1906   vancomycin (VANCOREADY) IVPB 750 mg/150 mL, 750 mg, Intravenous, Q24H, [place], RValeda Malm RPH   Exam: Current vital signs: BP (!) 172/117 (BP Location: Right Arm)   Pulse (!) 110   Temp 98.5 F (36.9 C) (Axillary)   Resp 20   Ht '5\' 10"'$  (1.778 m)   Wt 72.6 kg   SpO2 99%   BMI 22.96 kg/m  Vital signs in last 24 hours: Temp:  [97.8 F (36.6 C)-98.7 F (37.1 C)] 98.5 F (36.9 C) (07/20 1200) Pulse Rate:  [75-127] 110 (07/20 1200) Resp:  [9-29] 20 (07/20 1200) BP: (124-211)/(68-133) 172/117 (07/20 1200) SpO2:  [96 %-100 %] 99 % (07/20 1200) Weight:  [72.6 kg] 72.6 kg (07/19 1340)  GENERAL: Awake, alert in NAD HEENT: - Normocephalic and atraumatic, dry mm, no LN++, no Thyromegally LUNGS - Clear to auscultation bilaterally with no wheezes CV - S1S2 RRR, no m/r/g, equal pulses bilaterally. ABDOMEN - Soft, nontender, nondistended with normoactive BS Ext: warm, well perfused, intact peripheral pulses, right above the knee amputation, no edema  NEURO:  Mental Status: Awake, alert and oriented to self.  Does not answer any other questions appropriately.  Perseverates on the same thoughts.  During exam he was perseverating on his pain level Language: speech is clear.   Cranial Nerves: PERRL, EOMI, visual fields full, no facial asymmetry, facial sensation intact, hearing intact, tongue/uvula/soft palate midline, normal sternocleidomastoid and trapezius muscle strength. No evidence of tongue atrophy or fibrillations Motor: Moves all extremities antigravity, does not hold them in the air to command Tone is normal and bulk is normal Sensation-withdraws to pain in all extremities Coordination: Unable to complete Gait- deferred  Labs I have reviewed labs in epic and the results pertinent to this consultation are:  CBC    Component Value Date/Time   WBC 10.3 06/09/2022 0359   RBC 4.39 06/09/2022 0359   HGB 12.1 (L) 06/09/2022 0359   HGB 10.7 (L) 04/28/2021 1403  HCT 36.6 (L) 06/09/2022 0359   HCT 32.5 (L) 04/28/2021 1403   PLT 295 06/09/2022 0359   PLT 211 04/28/2021 1403   MCV 83.4 06/09/2022 0359   MCV 83 04/28/2021 1403   MCH 27.6 06/09/2022 0359   MCHC 33.1 06/09/2022 0359   RDW 14.6 06/09/2022 0359   RDW 13.6 04/28/2021 1403   LYMPHSABS 1.2 06/08/2022 0928   LYMPHSABS 1.2 08/21/2020 1344   MONOABS 0.5 06/08/2022 0928   EOSABS 0.0 06/08/2022 0928   EOSABS 0.2 08/21/2020 1344   BASOSABS 0.0 06/08/2022 0928   BASOSABS 0.1 08/21/2020 1344    CMP     Component Value Date/Time   NA 140 06/09/2022 0359   NA 140 04/28/2021 1403   K 3.2 (L) 06/09/2022 0359   CL 106 06/09/2022 0359   CO2 22 06/09/2022 0359   GLUCOSE 151 (H) 06/09/2022 0359   GLUCOSE 358 (H) 10/26/2006 1158   BUN 21 06/09/2022 0359   BUN 14 04/28/2021 1403   CREATININE 1.70 (H) 06/09/2022 0359   CREATININE 1.61 (H) 04/04/2017 0324   CALCIUM 9.2 06/09/2022 0359   PROT 7.2 06/08/2022 0928   PROT 6.5 01/11/2016 1509   ALBUMIN 3.7 06/08/2022 0928   ALBUMIN 3.6 01/11/2016 1509   AST 16 06/08/2022 0928   ALT 14 06/08/2022 0928   ALKPHOS 103 06/08/2022 0928   BILITOT 0.8 06/08/2022 0928   BILITOT 0.4 01/11/2016 1509   GFRNONAA 39 (L) 06/09/2022 0359   GFRNONAA 40 (L)  04/04/2017 0324   GFRAA 49 (L) 09/18/2020 1321   GFRAA 46 (L) 04/04/2017 0324    Lipid Panel     Component Value Date/Time   CHOL 170 06/01/2017 1345   CHOL 140 01/11/2016 1509   TRIG 70 06/01/2017 1345   HDL 50 06/01/2017 1345   HDL 28 (L) 01/11/2016 1509   CHOLHDL 3.4 06/01/2017 1345   VLDL 14 06/01/2017 1345   LDLCALC 106 (H) 06/01/2017 1345   LDLCALC 81 01/11/2016 1509   LDLDIRECT 131.6 01/20/2012 1310     Imaging I have reviewed the images obtained:  CT-head- no acute intracranial abnormality   CT Chest Abdomen Pelvis - No evidence for acute pulmonary embolus. - Signs of acute discitis/osteomyelitis at T11-12 with significant endplate destruction of both vertebral bodies and retropulsion of bony fragment into the ventral spinal canal. Surrounding soft tissue stranding is identified. - Mild discitis/osteomyelitis is also suspected at the L2-3 level. The disc space is indistinct with mild erosive changes involving the endplates. No significant surrounding inflammatory fat stranding. - Mild diffuse ground-glass attenuation identified throughout both lungs which may reflect mild pulmonary edema. There is abnormal dilatation of the proximal and midthoracic esophagus. This is most dilated just below the level of the carina. The distal segment of the esophagus has a normal caliber. Cannot exclude distal esophageal stricture. - Sequelae of remote granulomatous disease. - Nonobstructing right renal calculus. - Sigmoid diverticulosis without signs of acute inflammation. - Aortic Atherosclerosis (ICD10-I70.0).  rEEG: This study showed generalized periodic discharges with triphasic morphology at 1.5 to 2 Hz which is on the ictal-interictal continuum with increased risk for seizures.  However, the frequency, morphology and reactivity to stimulation is more commonly indicative of toxic-metabolic causes like cefepime toxicity.  Additionally there is moderate diffuse encephalopathy,  nonspecific etiology.  No definite seizures were seen during this study.   Assessment:  87 y.o. male with a past medical history of HTN, PAF on eliquis, U6JF, diastolic CHF, right above-the-knee amputation, sick sinus  syndrome s/p St. Jude PPM in 2013, GERD/esophageal stricture who presented to ED with confusion, perseverating speech with repetition, significant AMS and back pain. He was found to have lower thoracic/upper lumbar discitis/osteomyelitis. He was started on Vanc and Cefepime and neurology consulted for assistance with evaluation.  Workup so far with CTH with no acute intracranial abnormalities. Routine EEG with triphasic waves which are indicative of metabolic derangements and likely from Cefepime neurotoxicity. I would strongly recommend alternative to Cefepime.  Impression: Acute metabolic encephalopathy Acute discitis/osteomyelitis of the thoracic/lumbar region Cefepime induced Neurotoxicity.  Recommendations: - strongly recommend alternative to Cefepime, high suspicion of developing Cefepime Neurotoxicity. - Routine EEG with triphasic waves. No clinical concerns for seizures but if he is persistently encephalopathic despite discontinuation of Cefepime or any clinical seizure activity, would consider getting cEEG. - Vit B12 levels are low from a neuro stand point. Will start PO B12 replacement. TSH normal, Ammonia normal. Folate pending. - Judicious use of opiods. Opiods can worsen encephalopathy but so can poorly controlled pain. - Antimicrobials per ID. Intervention per Neurosurgery. -Agree with palliative care consultation to establish goals of care with the family   Patient seen and examined by NP/APP with MD. MD to update note as needed.   Janine Ores, DNP, FNP-BC [facility] Pager: [phone]  NEUROHOSPITALIST ADDENDUM Performed a face to face diagnostic evaluation.   I have reviewed the contents of history and physical exam as documented by  PA/ARNP/Resident and agree with above documentation.  I have discussed and formulated the above plan as documented. Edits to the note have been made as needed.  Impression:  Key exam findings: Plan:  Donnetta Simpers, MD [facility] [phone]   If 7pm to 7am, please call on call as listed on AMION.

## 2022-06-09 NOTE — Consult Note (Signed)
[facility] Infectious Disease       Reason for Consult:discitis/osteomyelitis    Referring Physician: Dr. Sarajane Jews  Principal Problem:   Acute discitis/osteomyelitis of thoracic/lumbar region of spine  Active Problems:   Essential hypertension   Atrial fibrillation (HCC)   Chronic diastolic CHF (congestive heart failure) ([place])   ESOPHAGEAL STRICTURE   GERD   Hyperlipidemia   Diabetes mellitus ([place])   AKI (acute kidney injury) ([place]) superimposed on CKD stage IIIb   Acute metabolic encephalopathy    diltiazem  180 mg Oral Daily   gabapentin  100 mg Oral BID   insulin aspart  0-9 Units Subcutaneous TID WC   pantoprazole (PROTONIX) IV  40 mg Intravenous Q24H    Recommendations: Continue antibiotics   Assessment: He has back pain and concerns on the CT scan for infection but his inflammatory markers are not significant elevated, no fever, no WBC.  It is essentially unclear if this is an infection vs pain from his compression fracture.  I would continue with antibiotics. AMS - unclear etiology and agree with neurology input.  I would not think it is related to his findings in his back/infection with no systemic signs.     Antibiotics: Vancomycin and cefepime  HPI: [name] is a 87 y.o. male functional at home with a history of a right BKA, SSS s/p St Jude pacemaker, diabetes, CHF who came in to the ED yesterday with signficant altered mental status.  He had been doing well but went out to the car and was found by his wife staring and repeating words.  He otherwise had not been sick.  Brought in to the ED and has remained altered.  CT scan of his abdomen/pelvis with concern for T11-12 discitis/osteomyelitis. He has a history of known issues with chronic superior endplate compression fracture of T11 noted on CT in 2019.  No reported fever.  WBC wnl and no fever here.  His main complaint is significant back pain and that is all he will say to me.     Review of  Systems:  Unable to be assessed due to mental status  Past Medical History:  Diagnosis Date   Atrial fibrillation ([place])    permanent   Backache, unspecified    Cellulitis and abscess of leg, except foot    CHF (congestive heart failure) ([place])    Diverticulosis 05-2006   Dr. [name]    Esophageal stricture    Fatigue    GERD (gastroesophageal reflux disease)    Gout, unspecified    Hx of colonic polyps 05-2006   (Adenomatous)Dr. [name]   Hx of radiation therapy 06/16/14- 07/18/14   mult squamous cell carcinomas of scalp   Hypertension    Long term (current) use of anticoagulants    Overweight(278.02)    Pacemaker- [name] 02/20/2013   Phlebitis and thrombophlebitis of superficial vessels of lower extremities    Right bundle branch block    Skin cancer    squamous cell carcinoma scalp   Syncope and collapse    Tachycardia-bradycardia (HCC)    s/p PPM   Type II or unspecified type diabetes mellitus without mention of complication, not stated as uncontrolled    Unspecified venous (peripheral) insufficiency     Social History   Tobacco Use   Smoking status: Former   Smokeless tobacco: Never   Tobacco comments:    Quit at age 82   Vaping Use   Vaping Use: Never used  Substance  Use Topics   Alcohol use: No    Alcohol/week: 0.0 standard drinks of alcohol    Comment: none currenlty   Drug use: No    Family History  Problem Relation Age of Onset   Pneumonia Mother    Heart disease Father    Diabetes Father    Colon cancer Neg Hx     Allergies  Allergen Reactions   Morphine Rash and Other (See Comments)    Irritability also   Statins Other (See Comments)    dizzy    Physical Exam: Constitutional: in no apparent distress  Vitals:   06/09/22 1235 06/09/22 1430  BP: (!) 186/116 (!) 192/95  Pulse: (!) 110 97  Resp: 14 17  Temp: 97.9 F (36.6 C) 98.6 F (37 C)  SpO2: 100% 100%   EYES: anicteric Respiratory: normal respiratory effort GI:  soft Musculoskeletal: right BKA   Lab Results  Component Value Date   WBC 10.3 06/09/2022   HGB 12.1 (L) 06/09/2022   HCT 36.6 (L) 06/09/2022   MCV 83.4 06/09/2022   PLT 295 06/09/2022    Lab Results  Component Value Date   CREATININE 1.70 (H) 06/09/2022   BUN 21 06/09/2022   NA 140 06/09/2022   K 3.2 (L) 06/09/2022   CL 106 06/09/2022   CO2 22 06/09/2022    Lab Results  Component Value Date   ALT 14 06/08/2022   AST 16 06/08/2022   ALKPHOS 103 06/08/2022     Microbiology: Recent Results (from the past 240 hour(s))  Urine Culture     Status: None   Collection Time: 06/08/22  9:13 AM   Specimen: Urine, Clean Catch  Result Value Ref Range Status   Specimen Description URINE, CLEAN CATCH  Final   Special Requests NONE  Final   Culture   Final    NO GROWTH Performed at [facility], [address] [place], [place] [address]    Report Status 06/09/2022 FINAL  Final  Blood Culture (routine x 2)     Status: None (Preliminary result)   Collection Time: 06/08/22  9:28 AM   Specimen: BLOOD RIGHT FOREARM  Result Value Ref Range Status   Specimen Description BLOOD RIGHT FOREARM  Final   Special Requests   Final    BOTTLES DRAWN AEROBIC AND ANAEROBIC Blood Culture adequate volume   Culture   Final    NO GROWTH < 24 HOURS Performed at [facility], [place] [address], [place], [place] [address]    Report Status PENDING  Incomplete  Blood Culture (routine x 2)     Status: None (Preliminary result)   Collection Time: 06/08/22 10:08 AM   Specimen: BLOOD LEFT HAND  Result Value Ref Range Status   Specimen Description BLOOD LEFT HAND  Final   Special Requests   Final    BOTTLES DRAWN AEROBIC AND ANAEROBIC Blood Culture adequate volume   Culture   Final    NO GROWTH < 24 HOURS Performed at [facility], [place] [address] [place], [place] [address]    Report Status PENDING  Incomplete    Thayer Headings, [facility] [place] [email or web address] 06/09/2022, 3:07 PM

## 2022-06-09 NOTE — Consult Note (Addendum)
Consultation Note Date: 06/09/2022   Patient Name: [name]  DOB: 1935/08/03  MRN: [id number]  Age / Sex: 87 y.o., male  PCP: Seward Carol, MD Referring Physician: Samuella Cota, MD  Reason for Consultation: Establishing goals of care, "call wife please"  HPI/Patient Profile: 87 y.o. male  with past medical history of HTN, PAF on eliquis, T2WP, diastolic CHF, SSS s/p St. Jude PPM in 06/2011, GERD/esophgaeal stricture presented to ED on 06/08/22 from home with AMS. Patient was admitted on 06/08/2022 with acute discitis/osteomylitis of the thoracic/lumbar region of spine, acute encephalopathy, AKI on CKD 3b.     Clinical Assessment and Goals of Care: I have reviewed medical records including EPIC notes, labs, and imaging. Received report from primary RN - acute concern of unmanaged pain.    Went to visit patient at bedside - no family/visitors present. [name], alert, disoriented x3 (could state first name only, not last name), and able to participate in very minimal conversation - repeats phrases. . Signs/non-verbal gestures of pain noted. No respiratory distress, increased work of breathing, or secretions noted. Patient is able to express he is having pain in his back.   Met with wife/[name] and daughter/[name] via speakerphone  to discuss diagnosis, prognosis, GOC, EOL wishes, disposition, and options.  I introduced Palliative Medicine as specialized medical care for people living with serious illness. It focuses on providing relief from the symptoms and stress of a serious illness. The goal is to improve quality of life for both the patient and the family.  We discussed a brief life review of the patient as well as functional and nutritional status.  Patient and his wife moved from [place] to [place] many years ago.  He worked in [employer] for many years and is  currently retired.  He and his wife have been married since 1960 - they have 2 daughters.  Prior to hospitalization, patient was living in a private residence/condo with his wife -she describes him as being "very social" with other residents of the facility.  She told me that patient was in the lobby conversing with others the night before his drastic decline.  [name] describes his decline is "out of the blue."  Due to his right AKA, patient was wheelchair-bound; however, he was able to transfer independently, get to the bathroom, cook, and could dress himself up until 3 months ago.  He does need assistance with bathing.  Patient has no home health services that visit him.  Wife describes his appetite as being very poor, which has had a gradual onset.  Albumin noted at 3.7 on 06/08/2022.  We discussed patient's current illness and what it means in the larger context of patient's on-going co-morbidities.  [name] understands that CHF and CKD are  progressive, non-curable disease underlying the patient's current acute medical conditions. [name] and daughter have a clear understanding of patient's acute current medical situation despite the uncertainty of what is causing his confusion.  Discussed neurosurgery and ID consults.  Family understand that patient is a very poor surgical candidate.  Natural disease trajectory and expectations at EOL were discussed. I attempted to elicit values and goals of care important to the patient. The difference between aggressive medical intervention and comfort care was considered in light of the patient's goals of care.  At this time family states they are "extremely curious as to what is happening."  They wish to try and gain additional information/insight into patient's current situation "  without putting him in too much discomfort."  Family expressed being open to comfort/hospice care if there is no improvement/noted decline.  Discussed with family the importance of continued  conversation with each other and the medical providers regarding overall plan of care and treatment options, ensuring decisions are within the context of the patient's values and GOCs.    Questions and concerns were addressed. The patient/family was encouraged to call with questions and/or concerns. PMT number was provided.  Discussed pain management concerns with Dr. Sarajane Jews.  Primary Decision Maker: NEXT OF KIN - wife/[name]    SUMMARY OF RECOMMENDATIONS   Continue full scope treatment with watchful waiting Continue DNR/DNI as previously documented Family wish to try and gain additional information/insight into patient's current situation  Family will make stepwise decisions pending patient's clinical course/information received.  They are open to comfort/hospice care if no improvement/decline Ongoing goals of care pending clinical course PMT will continue to follow and support holistically   Code Status/Advance Care Planning: DNR  Palliative Prophylaxis:  Aspiration, Bowel Regimen, Delirium Protocol, Frequent Pain Assessment, Oral Care, and Turn Reposition  Additional Recommendations (Limitations, Scope, Preferences): Full Scope Treatment  Psycho-social/Spiritual:  Desire for further Chaplaincy support:no Created space and opportunity for patient and family to express thoughts and feelings regarding patient's current medical situation.  Emotional support and therapeutic listening provided.  Prognosis:  Unable to determine  Discharge Planning: To Be Determined      Primary Diagnoses: Present on Admission:  Essential hypertension  Atrial fibrillation (HCC)  Chronic diastolic CHF (congestive heart failure) (HCC)  GERD  AKI (acute kidney injury) ([place]) superimposed on CKD stage IIIb  Hyperlipidemia  Acute discitis/osteomyelitis of thoracic/lumbar region of spine    I have reviewed the medical record, interviewed the patient and family, and examined the patient.  The following aspects are pertinent.  Past Medical History:  Diagnosis Date   Atrial fibrillation ([place])    permanent   Backache, unspecified    Cellulitis and abscess of leg, except foot    CHF (congestive heart failure) ([place])    Diverticulosis 05-2006   Dr. [name]    Esophageal stricture    Fatigue    GERD (gastroesophageal reflux disease)    Gout, unspecified    Hx of colonic polyps 05-2006   (Adenomatous)Dr. [name]   Hx of radiation therapy 06/16/14- 07/18/14   mult squamous cell carcinomas of scalp   Hypertension    Long term (current) use of anticoagulants    Overweight(278.02)    Pacemaker- [name] 02/20/2013   Phlebitis and thrombophlebitis of superficial vessels of lower extremities    Right bundle branch block    Skin cancer    squamous cell carcinoma scalp   Syncope and collapse    Tachycardia-bradycardia (HCC)    s/p PPM   Type II or unspecified type diabetes mellitus without mention of complication, not stated as uncontrolled    Unspecified venous (peripheral) insufficiency    Social History   Socioeconomic History   Marital status: Married    Spouse name: Not on file   Number of children: 2   Years of education: Not on file   Highest education level: Not on file  Occupational History   Occupation: Retired Hotel manager  Tobacco Use   Smoking status: Former   Smokeless tobacco: Never   Tobacco comments:    Quit at age 22   Vaping Use   Vaping Use: Never used  Substance and Sexual Activity   Alcohol use: No  Alcohol/week: 0.0 standard drinks of alcohol    Comment: none currenlty   Drug use: No   Sexual activity: Not Currently  Other Topics Concern   Not on file  Social History Narrative   2 caffeine drinks daily    Stopped smoking 1963   Married   Exercise bar bells (R) leg amputation   Alcohol none   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, also did Community education officer work   Social Determinants of Radio broadcast assistant Strain: Not on file  Food  Insecurity: Not on file  Transportation Needs: Not on file  Physical Activity: Not on file  Stress: Not on file  Social Connections: Not on file   Family History  Problem Relation Age of Onset   Pneumonia Mother    Heart disease Father    Diabetes Father    Colon cancer Neg Hx    Scheduled Meds:  diltiazem  180 mg Oral Daily   gabapentin  100 mg Oral BID   insulin aspart  0-9 Units Subcutaneous TID WC   pantoprazole (PROTONIX) IV  40 mg Intravenous Q24H   Continuous Infusions:  ceFEPime (MAXIPIME) IV Stopped (06/09/22 1022)   heparin 800 Units/hr (06/09/22 0658)   vancomycin     PRN Meds:.acetaminophen **OR** acetaminophen, HYDROmorphone (DILAUDID) injection, metoprolol tartrate Medications Prior to Admission:  Prior to Admission medications   Medication Sig Start Date End Date Taking? Authorizing Provider  apixaban (ELIQUIS) 5 MG TABS tablet Take 5 mg by mouth 2 (two) times daily.    Yes [provider]  baclofen (LIORESAL) 10 MG tablet Take 10 mg by mouth every 8 (eight) hours. 06/06/22  Yes [provider]  diltiazem (CARDIZEM CD) 180 MG 24 hr capsule Take 180 mg by mouth daily. 01/07/19  Yes [provider]  furosemide (LASIX) 20 MG tablet Take 20 mg by mouth every other day.   Yes [provider]  gabapentin (NEURONTIN) 100 MG capsule Take 100 mg by mouth 2 (two) times daily.   Yes [provider]  Insulin Glargine (LANTUS SOLOSTAR ) Inject into the skin as needed.    Yes [provider]  insulin lispro (HUMALOG) 100 UNIT/ML KiwkPen Inject into the skin as needed.    Yes [provider]  melatonin 5 MG TABS Take 5 mg by mouth at bedtime.   Yes [provider]  Multiple Vitamins-Minerals (MULTIVITAMIN PO) Take 1 tablet by mouth daily. Takes a Men's 50 Plus Multivitamin   Yes [provider]  omeprazole (PRILOSEC) 20 MG capsule Take 20 mg by mouth daily.   Yes [provider]  Oxycodone  HCl 10 MG TABS Take 1 tablet (10 mg total) by mouth every 8 (eight) hours as needed. For pain 08/15/17  Yes Eulas Post, Gant[place], DO  ACCU-CHEK SOFTCLIX LANCETS lancets Use to check blood sugar three times daily. Dx E11.9 04/26/17   Reed, Tiffany L, DO  B-D ULTRAFINE III SHORT PEN 31G X 8 MM MISC USE AS DIRECTED 02/20/18   Reed, Tiffany L, DO  Blood Glucose Calibration (ACCU-CHEK AVIVA) SOLN Use as Directed. Dx E11.9 05/01/17   Mariea Clonts, Tiffany L, DO  glucose blood (ACCU-CHEK AVIVA PLUS) test strip DX E11.9, check blood sugar three times daily 04/18/17   Reed, Tiffany L, DO   Allergies  Allergen Reactions   Morphine Rash and Other (See Comments)    Irritability also   Statins Other (See Comments)    dizzy   Review of Systems  Unable  to perform ROS: Mental status change    Physical Exam Vitals and nursing note reviewed.  Constitutional:      General: He is not in acute distress.    Appearance: He is ill-appearing.  Pulmonary:     Effort: No respiratory distress.  Skin:    General: Skin is warm and dry.  Neurological:     Mental Status: He is alert. He is disoriented and confused.     Motor: Weakness present.  Psychiatric:        Attention and Perception: He is inattentive.        Cognition and Memory: Cognition is impaired. Memory is impaired.        Judgment: Judgment is impulsive.     Vital Signs: BP (!) 186/116 (BP Location: Right Arm)   Pulse (!) 110   Temp 97.9 F (36.6 C) (Oral)   Resp 14   Ht _0  (1.778 m)   Wt 72.6 kg   SpO2 100%   BMI 22.96 kg/m  Pain Scale: PAINAD   Pain Score: Asleep   SpO2: SpO2: 100 % O2 Device:SpO2: 100 % O2 Flow Rate: .   IO: Intake/output summary:  Intake/Output Summary (Last 24 hours) at 06/09/2022 1326 Last data filed at 06/09/2022 0555 Gross per 24 hour  Intake 116.47 ml  Output --  Net 116.47 ml    LBM:   Baseline Weight: Weight: 72.6 kg Most recent weight: Weight: 72.6 kg     Palliative Assessment/Data: PPS 10%, currently n.p.o.  status     Time In: 1325 Time Out: 1455 Time Total: 90 minutes  Greater than 50%  of this time was spent counseling and coordinating care related to the above assessment and plan.  Signed by: Lin Landsman, NP   Please contact Palliative Medicine Team phone at [phone] for questions and concerns.  For individual provider: See Amion  *Portions of this note are a verbal dictation therefore any spelling and/or grammatical errors are due to the "[employer]" system interpretation.

## 2022-06-09 NOTE — ED Notes (Signed)
ED TO INPATIENT HANDOFF REPORT    S Name/Age/Gender [name] 87 y.o. male Room/Bed: 041C/041C  Code Status   Code Status: Full Code  Home/SNF/Other Home Patient oriented to: self Is this baseline? No   Triage Complete: Triage complete  Chief Complaint Discitis of lumbar region [M46.46]  Triage Note PT arrived via [facility] from home with AMS.  Wife stated to EMS the patient was normal yesterday until around 5pm where he became slumped over.  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 Ems reports he just states yes and thank you.  Fire said he was initially 84%/RA, then placed on 02/2L and increased to 88%. Then EMS placed on NRB. Currently RA 100%.  Pt states he is in pain. Pt does answer to name.  BP for EMS 220/104.  CBG 176.  No recent falls.   Allergies Allergies  Allergen Reactions   Morphine Rash and Other (See Comments)    Irritability also   Statins Other (See Comments)    dizzy    Level of Care/Admitting Diagnosis ED Disposition     ED Disposition  Admit   Condition  --   [place]: [facility] [[id number]]  Level of Care: Telemetry Medical [104]  May admit patient to [facility] or [facility] if equivalent level of care is available:: No  Covid Evaluation: Asymptomatic - no recent exposure (last 10 days) testing not required  Diagnosis: Discitis of lumbar region [161096]  Admitting Physician: Orma Flaming [[id number]]  Attending Physician: Orma Flaming [[id number]]  Certification:: I certify this patient will need inpatient services for at least 2 midnights  Estimated Length of Stay: 3          B Medical/Surgery History Past Medical History:  Diagnosis Date   Atrial fibrillation ([place])    permanent   Backache, unspecified    Cellulitis and abscess of leg, except foot    CHF (congestive heart failure) ([place])    Diverticulosis 05-2006   Dr. [name]    Esophageal stricture    Fatigue    GERD (gastroesophageal reflux disease)     Gout, unspecified    Hx of colonic polyps 05-2006   (Adenomatous)Dr. [name]   Hx of radiation therapy 06/16/14- 07/18/14   mult squamous cell carcinomas of scalp   Hypertension    Long term (current) use of anticoagulants    Overweight(278.02)    Pacemaker- [name] 02/20/2013   Phlebitis and thrombophlebitis of superficial vessels of lower extremities    Right bundle branch block    Skin cancer    squamous cell carcinoma scalp   Syncope and collapse    Tachycardia-bradycardia ([place])    s/p PPM   Type II or unspecified type diabetes mellitus without mention of complication, not stated as uncontrolled    Unspecified venous (peripheral) insufficiency    Past Surgical History:  Procedure Laterality Date   ABOVE KNEE LEG AMPUTATION Right 04/02/2014   DR DUDA   AMPUTATION Right 01/20/2014   Procedure: AMPUTATION BELOW KNEE- right;  Surgeon: [name], MD;  Location: [facility];  Service: Orthopedics;  Laterality: Right;  Right Below Knee Amputation   AMPUTATION Right 02/14/2014   Procedure: AMPUTATION BELOW KNEE;  Surgeon: [name], MD;  Location: [facility];  Service: Orthopedics;  Laterality: Right;  Right Below Knee Amputation Revision   AMPUTATION Right 03/07/2014   Procedure: AMPUTATION BELOW KNEE;  Surgeon: [name], MD;  Location: [facility];  Service: Orthopedics;  Laterality: Right;  Right Below Knee Amputation Revision   AMPUTATION Right 03/18/2014   Procedure: revision of  AMPUTATION BELOW KNEE;  Surgeon: [name], MD;  Location: [facility];  Service: Orthopedics;  Laterality: Right;  Revision Right Below Knee Amputation   AMPUTATION Right 04/02/2014   Procedure: AMPUTATION ABOVE KNEE;  Surgeon: [name], MD;  Location: [place];  Service: Orthopedics;  Laterality: Right;  Right Above Knee Amputation   EYE SURGERY     I & D EXTREMITY Right 01/17/2014   Procedure: IRRIGATION AND DEBRIDEMENT RIGHT HEEL  WITH CULTURES AND BONE BIOPSY, placement of wound vac;  Surgeon: Theodoro Kos, DO;   Location: [facility];  Service: Plastics;  Laterality: Right;   LAMINECTOMY     LUMBAR FUSION     PACEMAKER INSERTION  8/12   SJM by [name] for tachy/brady syndrome   ROTATOR CUFF REPAIR     ruptured rt rectus muscle     SKIN BIOPSY     scalp     A IV Location/Drains/Wounds Patient Lines/Drains/Airways Status     Active Line/Drains/Airways     Name Placement date Placement time Site Days   Peripheral IV 06/08/22 20 G Anterior;Right Forearm 06/08/22  --  Forearm  1   Peripheral IV 06/08/22 18 G Anterior;Left Forearm 06/08/22  --  Forearm  1   Incision (Closed) 03/18/14 Thigh Right 03/18/14  1831  -- 3005   Incision (Closed) 04/02/14 Thigh Right 04/02/14  1133  -- 2990   Pressure Ulcer 03/09/14 Stage II -  Partial thickness loss of dermis presenting as a shallow open ulcer with a red, pink wound bed without slough. 03/09/14  0015  -- 3014   Wound / Incision (Open or Dehisced) 04/03/14 Arm Left skin tear 04/03/14  1000  Arm  2989   Wound / Incision (Open or Dehisced) 03/23/17 Non-pressure wound Toe (Comment  which one) Left left great toe 03/23/17  --  Toe (Comment  which one)  1904            Intake/Output Last 24 hours  Intake/Output Summary (Last 24 hours) at 06/09/2022 1036 Last data filed at 06/09/2022 0555 Gross per 24 hour  Intake 1116.47 ml  Output --  Net 1116.47 ml    Labs/Imaging Results for orders placed or performed during the hospital encounter of 06/08/22 (from the past 48 hour(s))  Comprehensive metabolic panel     Status: Abnormal   Collection Time: 06/08/22  9:28 AM  Result Value Ref Range   Sodium 140 135 - 145 mmol/L   Potassium 3.6 3.5 - 5.1 mmol/L   Chloride 100 98 - 111 mmol/L   CO2 25 22 - 32 mmol/L   Glucose, Bld 163 (H) 70 - 99 mg/dL    Comment: Glucose reference range applies only to samples taken after fasting for at least 8 hours.   BUN 21 8 - 23 mg/dL   Creatinine, Ser 1.74 (H) 0.61 - 1.24 mg/dL   Calcium 10.1 8.9 - 10.3 mg/dL   Total  Protein 7.2 6.5 - 8.1 g/dL   Albumin 3.7 3.5 - 5.0 g/dL   AST 16 15 - 41 U/L   ALT 14 0 - 44 U/L   Alkaline Phosphatase 103 38 - 126 U/L   Total Bilirubin 0.8 0.3 - 1.2 mg/dL   GFR, Estimated 38 (L) >60 mL/min    Comment: (NOTE) Calculated using the CKD-EPI Creatinine Equation (2021)    Anion gap 15 5 -  15    Comment: Performed at [facility], [place] [address], [place], [place] [address]  Troponin I (High Sensitivity)     Status: None   Collection Time: 06/08/22  9:28 AM  Result Value Ref Range   Troponin I (High Sensitivity) 11 <18 ng/L    Comment: (NOTE) Elevated high sensitivity troponin I (hsTnI) values and significant  changes across serial measurements may suggest ACS but many other  chronic and acute conditions are known to elevate hsTnI results.  Refer to the "Links" section for chest pain algorithms and additional  guidance. Performed at [facility], [place] [address], [place], [place] [address]   CBC with Differential     Status: None   Collection Time: 06/08/22  9:28 AM  Result Value Ref Range   WBC 8.6 4.0 - 10.5 K/uL   RBC 4.77 4.22 - 5.81 MIL/uL   Hemoglobin 13.0 13.0 - 17.0 g/dL   HCT 41.0 39.0 - 52.0 %   MCV 86.0 80.0 - 100.0 fL   MCH 27.3 26.0 - 34.0 pg   MCHC 31.7 30.0 - 36.0 g/dL   RDW 14.3 11.5 - 15.5 %   Platelets 303 150 - 400 K/uL   nRBC 0.0 0.0 - 0.2 %   Neutrophils Relative % 79 %   Neutro Abs 6.9 1.7 - 7.7 K/uL   Lymphocytes Relative 14 %   Lymphs Abs 1.2 0.7 - 4.0 K/uL   Monocytes Relative 6 %   Monocytes Absolute 0.5 0.1 - 1.0 K/uL   Eosinophils Relative 0 %   Eosinophils Absolute 0.0 0.0 - 0.5 K/uL   Basophils Relative 0 %   Basophils Absolute 0.0 0.0 - 0.1 K/uL   Immature Granulocytes 1 %   Abs Immature Granulocytes 0.05 0.00 - 0.07 K/uL    Comment: Performed at [facility], [address] [place], [place] [address]  Brain natriuretic peptide     Status: Abnormal   Collection Time: 06/08/22  9:28 AM  Result Value Ref  Range   B Natriuretic Peptide 215.4 (H) 0.0 - 100.0 pg/mL    Comment: Performed at [facility] [address] [place], [place] [address]  TSH     Status: None   Collection Time: 06/08/22  9:28 AM  Result Value Ref Range   TSH 3.461 0.350 - 4.500 uIU/mL    Comment: Performed by a 3rd Generation assay with a functional sensitivity of <=0.01 uIU/mL. Performed at [facility], [place] [address], [place], [place] [address]   Lactic acid, plasma     Status: None   Collection Time: 06/08/22  9:28 AM  Result Value Ref Range   Lactic Acid, Venous 1.3 0.5 - 1.9 mmol/L    Comment: Performed at [facility] [address], [place], [place] [address]  Protime-INR     Status: Abnormal   Collection Time: 06/08/22  9:28 AM  Result Value Ref Range   Prothrombin Time 15.3 (H) 11.4 - 15.2 seconds   INR 1.2 0.8 - 1.2    Comment: (NOTE) INR goal varies based on device and disease states. Performed at [facility], [place] [address] [place], [place] [address]   APTT     Status: None   Collection Time: 06/08/22  9:28 AM  Result Value Ref Range   aPTT 32 24 - 36 seconds    Comment: Performed at [facility] [address] [place], [place]  [address]  Blood Culture (routine x 2)     Status: None (Preliminary result)   Collection Time: 06/08/22  9:28 AM   Specimen: BLOOD RIGHT FOREARM  Result Value Ref Range   Specimen Description BLOOD RIGHT FOREARM    Special Requests      BOTTLES DRAWN AEROBIC AND ANAEROBIC Blood Culture adequate volume   Culture      NO GROWTH < 24 HOURS Performed at [facility], [place] [address], [place], [place] [address]    Report Status PENDING   Urinalysis, Routine w reflex microscopic Urine, In & Out Cath     Status: Abnormal   Collection Time: 06/08/22 10:00 AM  Result Value Ref Range   Color, Urine STRAW (A) YELLOW   APPearance CLEAR CLEAR   Specific Gravity, Urine 1.008 1.005 - 1.030   pH 7.0 5.0 - 8.0   Glucose, UA 50 (A)  NEGATIVE mg/dL   Hgb urine dipstick NEGATIVE NEGATIVE   Bilirubin Urine NEGATIVE NEGATIVE   Ketones, ur 5 (A) NEGATIVE mg/dL   Protein, ur >=300 (A) NEGATIVE mg/dL   Nitrite NEGATIVE NEGATIVE   Leukocytes,Ua NEGATIVE NEGATIVE   RBC / HPF 0-5 0 - 5 RBC/hpf   Bacteria, UA NONE SEEN NONE SEEN    Comment: Performed at [facility], [place] [address] [place], [place] [address]  Blood Culture (routine x 2)     Status: None (Preliminary result)   Collection Time: 06/08/22 10:08 AM   Specimen: BLOOD LEFT HAND  Result Value Ref Range   Specimen Description BLOOD LEFT HAND    Special Requests      BOTTLES DRAWN AEROBIC AND ANAEROBIC Blood Culture adequate volume   Culture      NO GROWTH < 24 HOURS Performed at [facility], [place] [address], [place], [place] [address]    Report Status PENDING   I-Stat venous blood gas, ED     Status: Abnormal   Collection Time: 06/08/22 10:22 AM  Result Value Ref Range   pH, Ven 7.408 7.25 - 7.43   pCO2, Ven 46.1 44 - 60 mmHg   pO2, Ven 30 (LL) 32 - 45 mmHg   Bicarbonate 29.1 (H) 20.0 - 28.0 mmol/L   TCO2 30 22 - 32 mmol/L   O2 Saturation 57 %   Acid-Base Excess 4.0 (H) 0.0 - 2.0 mmol/L   Sodium 139 135 - 145 mmol/L   Potassium 3.5 3.5 - 5.1 mmol/L   Calcium, Ion 1.19 1.15 - 1.40 mmol/L   HCT 39.0 39.0 - 52.0 %   Hemoglobin 13.3 13.0 - 17.0 g/dL   Sample type VENOUS    Comment NOTIFIED PHYSICIAN   Lactic acid, plasma     Status: None   Collection Time: 06/08/22 11:24 AM  Result Value Ref Range   Lactic Acid, Venous 1.5 0.5 - 1.9 mmol/L    Comment: Performed at [facility] [address] [place], [place] [address]  Troponin I (High Sensitivity)     Status: None   Collection Time: 06/08/22 11:24 AM  Result Value Ref Range   Troponin I (High Sensitivity) 11 <18 ng/L    Comment: (NOTE) Elevated high sensitivity troponin I (hsTnI) values and significant  changes across serial measurements may suggest ACS but many other  chronic  and acute conditions are known to elevate hsTnI results.  Refer to the "Links" section for chest pain algorithms and additional  guidance. Performed at [facility], [address], [place], [place] [address]   Hemoglobin A1c     Status: Abnormal   Collection Time: 06/08/22  2:57 PM  Result Value Ref Range   Hgb A1c MFr Bld 6.4 (H) 4.8 - 5.6 %    Comment: (NOTE) Pre diabetes:          5.7%-6.4%  Diabetes:              >6.4%  Glycemic control for   <7.0% adults with diabetes    Mean Plasma Glucose 136.98 mg/dL    Comment: Performed at [facility] [address] [place], [place] [address]  Ammonia     Status: None   Collection Time: 06/08/22  2:57 PM  Result Value Ref Range   Ammonia <10 9 - 35 umol/L    Comment: Performed at [facility], [place] [address], [place], [place] [address]  Sedimentation rate     Status: Abnormal   Collection Time: 06/08/22  2:57 PM  Result Value Ref Range   Sed Rate 38 (H) 0 - 16 mm/hr    Comment: Performed at [facility] [address], [place], [place] [address]  C-reactive protein     Status: None   Collection Time: 06/08/22  3:13 PM  Result Value Ref Range   CRP 0.8 <1.0 mg/dL    Comment: Performed at [facility] [address], [place], [place] [address]  Vitamin B12     Status: None   Collection Time: 06/08/22  3:15 PM  Result Value Ref Range   Vitamin B-12 341 180 - 914 pg/mL    Comment: (NOTE) This assay is not validated for testing neonatal or myeloproliferative syndrome specimens for Vitamin B12 levels. Performed at [facility], [place] [address], [place], [place] [address]   CBG monitoring, ED     Status: Abnormal   Collection Time: 06/08/22  5:40 PM  Result Value Ref Range   Glucose-Capillary 141 (H) 70 - 99 mg/dL    Comment: Glucose reference range applies only to samples taken after fasting for at least 8 hours.  CBG monitoring, ED     Status: Abnormal   Collection Time: 06/08/22 11:05  PM  Result Value Ref Range   Glucose-Capillary 139 (H) 70 - 99 mg/dL    Comment: Glucose reference range applies only to samples taken after fasting for at least 8 hours.  Basic metabolic panel     Status: Abnormal   Collection Time: 06/09/22  3:59 AM  Result Value Ref Range   Sodium 140 135 - 145 mmol/L   Potassium 3.2 (L) 3.5 - 5.1 mmol/L   Chloride 106 98 - 111 mmol/L   CO2 22 22 - 32 mmol/L   Glucose, Bld 151 (H) 70 - 99 mg/dL    Comment: Glucose reference range applies only to samples taken after fasting for at least 8 hours.   BUN 21 8 - 23 mg/dL   Creatinine, Ser 1.70 (H) 0.61 - 1.24 mg/dL   Calcium 9.2 8.9 - 10.3 mg/dL   GFR, Estimated 39 (L) >60 mL/min    Comment: (NOTE) Calculated using the CKD-EPI Creatinine Equation (2021)    Anion gap 12 5 - 15    Comment: Performed at [facility] [address], [place], [place] [address]  CBC     Status: Abnormal   Collection Time: 06/09/22  3:59 AM  Result Value Ref Range   WBC 10.3 4.0 -  10.5 K/uL   RBC 4.39 4.22 - 5.81 MIL/uL   Hemoglobin 12.1 (L) 13.0 - 17.0 g/dL   HCT 36.6 (L) 39.0 - 52.0 %   MCV 83.4 80.0 - 100.0 fL   MCH 27.6 26.0 - 34.0 pg   MCHC 33.1 30.0 - 36.0 g/dL   RDW 14.6 11.5 - 15.5 %   Platelets 295 150 - 400 K/uL   nRBC 0.0 0.0 - 0.2 %    Comment: Performed at [facility], [place] [address], [place], [place] [address]  Heparin level (unfractionated)     Status: Abnormal   Collection Time: 06/09/22  3:59 AM  Result Value Ref Range   Heparin Unfractionated >1.10 (H) 0.30 - 0.70 IU/mL    Comment: (NOTE) The clinical reportable range upper limit is being lowered to >1.10 to align with the FDA approved guidance for the current laboratory assay.  If heparin results are below expected values, and patient dosage has  been confirmed, suggest follow up testing of antithrombin III levels. Performed at [facility], [place] [address] [place], [place] [address]   APTT     Status: Abnormal    Collection Time: 06/09/22  3:59 AM  Result Value Ref Range   aPTT 169 (HH) 24 - 36 seconds    Comment:        IF BASELINE aPTT IS ELEVATED, SUGGEST PATIENT RISK ASSESSMENT BE USED TO DETERMINE APPROPRIATE ANTICOAGULANT THERAPY. REPEATED TO VERIFY CRITICAL RESULT CALLED TO, READ BACK BY AND VERIFIED WITH: Hazle Coca, RN 06/09/2022 0506 [address]TA[address]OR Performed at [facility] [address] [place], [place] [address]   CBG monitoring, ED     Status: Abnormal   Collection Time: 06/09/22  7:09 AM  Result Value Ref Range   Glucose-Capillary 141 (H) 70 - 99 mg/dL    Comment: Glucose reference range applies only to samples taken after fasting for at least 8 hours.   Comment 1 Notify RN    Comment 2 Document in Chart    ECHOCARDIOGRAM COMPLETE  Result Date: 06/08/2022    ECHOCARDIOGRAM REPORT   Patient Name:   [name] Date of Exam: 06/08/2022 Medical Rec #:  [id number]        Height:       70.0 in Accession #:    [id number]       Weight:       160.0 lb Date of Birth:  04-25-1935        BSA:          1.898 m Patient Age:    89 years         BP:           214/126 mmHg Patient Gender: M                HR:           84 bpm. Exam Location:  Inpatient Procedure: 2D Echo, Color Doppler and Cardiac Doppler Indications:    "Discitis of multiple sites of the spine"  History:        Patient has prior history of Echocardiogram examinations, most                 recent 03/05/2014. CHF, Pacemaker, Arrythmias:Atrial                 Fibrillation; Risk Factors:Hypertension, Diabetes and                 Dyslipidemia.  Sonographer:  [place] Referring Phys: [id number] [place]  1. Left ventricular ejection fraction, by estimation, is 60 to 65%. The left ventricle has normal function. The left ventricle has no regional wall motion abnormalities. Diastolic function indeterminant due to AFib.  2. Right ventricular systolic function is mildly reduced. The right ventricular size is normal.  There is normal pulmonary artery systolic pressure. The estimated right ventricular systolic pressure is 96.2 mmHg.  3. Left atrial size was severely dilated.  4. Right atrial size was mildly dilated.  5. The mitral valve is degenerative. Moderate mitral valve regurgitation. Severe mitral annular calcification.  6. Tricuspid valve regurgitation is moderate.  7. The aortic valve is tricuspid. There is moderate calcification of the aortic valve. There is moderate thickening of the aortic valve. Aortic valve regurgitation is trivial. Aortic valve sclerosis/calcification is present, without any evidence of aortic stenosis.  8. The inferior vena cava is normal in size with greater than 50% respiratory variability, suggesting right atrial pressure of 3 mmHg.  9. No valvular vegetations visualized, however, degree of MAC makes it difficult to fully interrogate mitral valve. Recommend TEE if clinically indicated. Comparison(s): No significant change from prior study in 2015. Conclusion(s)/Recommendation(s): No evidence of valvular vegetations on this transthoracic echocardiogram. Consider a transesophageal echocardiogram to exclude infective endocarditis if clinically indicated. FINDINGS  Left Ventricle: Left ventricular ejection fraction, by estimation, is 60 to 65%. The left ventricle has normal function. The left ventricle has no regional wall motion abnormalities. The left ventricular internal cavity size was normal in size. There is  no left ventricular hypertrophy. Diastolic function indeterminant due to AFib. Right Ventricle: The right ventricular size is normal. No increase in right ventricular wall thickness. Right ventricular systolic function is mildly reduced. There is normal pulmonary artery systolic pressure. The tricuspid regurgitant velocity is 2.80 m/s, and with an assumed right atrial pressure of 3 mmHg, the estimated right ventricular systolic pressure is 95.2 mmHg. Left Atrium: Left atrial size was  severely dilated. Right Atrium: Right atrial size was mildly dilated. Pericardium: There is no evidence of pericardial effusion. Mitral Valve: The mitral valve is degenerative in appearance. There is mild thickening of the mitral valve leaflet(s). There is mild calcification of the mitral valve leaflet(s). Severe mitral annular calcification. Moderate mitral valve regurgitation. MV peak gradient, 13.2 mmHg. The mean mitral valve gradient is 4.0 mmHg. Tricuspid Valve: The tricuspid valve is normal in structure. Tricuspid valve regurgitation is moderate. Aortic Valve: The aortic valve is tricuspid. There is moderate calcification of the aortic valve. There is moderate thickening of the aortic valve. Aortic valve regurgitation is trivial. Aortic valve sclerosis/calcification is present, without any evidence of aortic stenosis. Pulmonic Valve: The pulmonic valve was normal in structure. Pulmonic valve regurgitation is trivial. Aorta: The aortic root and ascending aorta are structurally normal, with no evidence of dilitation. Venous: The inferior vena cava is normal in size with greater than 50% respiratory variability, suggesting right atrial pressure of 3 mmHg. IAS/Shunts: The atrial septum is grossly normal. Additional Comments: A device lead is visualized.  LEFT VENTRICLE PLAX 2D LVIDd:         4.55 cm   Diastology LVIDs:         2.85 cm   LV e' medial:    7.51 cm/s LV PW:         1.05 cm   LV E/e' medial:  22.6 LV IVS:        0.80 cm   LV e' lateral:  8.05 cm/s LVOT diam:     2.00 cm   LV E/e' lateral: 21.1 LV SV:         45 LV SV Index:   23 LVOT Area:     3.14 cm  RIGHT VENTRICLE RV S prime:     5.33 cm/s TAPSE (M-mode): 1.4 cm LEFT ATRIUM              Index        RIGHT ATRIUM           Index LA diam:        5.80 cm  3.06 cm/m   RA Area:     21.40 cm LA Vol (A2C):   136.0 ml 71.64 ml/m  RA Volume:   52.40 ml  27.60 ml/m LA Vol (A4C):   131.5 ml 69.27 ml/m LA Biplane Vol: 132.0 ml 69.53 ml/m  AORTIC VALVE  LVOT Vmax:   66.40 cm/s LVOT Vmean:  49.200 cm/s LVOT VTI:    0.142 m  AORTA Ao Root diam: 3.40 cm Ao Asc diam:  3.30 cm MITRAL VALVE                TRICUSPID VALVE MV Area (PHT): 4.01 cm     TR Peak grad:   31.4 mmHg MV Area VTI:   1.26 cm     TR Vmax:        280.00 cm/s MV Peak grad:  13.2 mmHg MV Mean grad:  4.0 mmHg     SHUNTS MV Vmax:       1.82 m/s     Systemic VTI:  0.14 m MV Vmean:      95.5 cm/s    Systemic Diam: 2.00 cm MV Decel Time: 189 msec MV E velocity: 170.00 cm/s MV A velocity: 73.20 cm/s MV E/A ratio:  2.32 Gwyndolyn Kaufman MD Electronically signed by Gwyndolyn Kaufman MD Signature Date/Time: 06/08/2022/3:50:54 PM    Final    CT Angio Chest PE W and/or Wo Contrast  Result Date: 06/08/2022 CLINICAL DATA:  High probability for acute pulmonary embolus. Sepsis. EXAM: CT ANGIOGRAPHY CHEST CT ABDOMEN AND PELVIS WITH CONTRAST TECHNIQUE: Multidetector CT imaging of the chest was performed using the standard protocol during bolus administration of intravenous contrast. Multiplanar CT image reconstructions and MIPs were obtained to evaluate the vascular anatomy. Multidetector CT imaging of the abdomen and pelvis was performed using the standard protocol during bolus administration of intravenous contrast. RADIATION DOSE REDUCTION: This exam was performed according to the departmental dose-optimization program which includes automated exposure control, adjustment of the mA and/or kV according to patient size and/or use of iterative reconstruction technique. CONTRAST:  63m OMNIPAQUE IOHEXOL 350 MG/ML SOLN COMPARISON:  CT AP from 09/20/18 FINDINGS: CTA CHEST FINDINGS Cardiovascular: Satisfactory opacification of the pulmonary arteries to the segmental level. No evidence of pulmonary embolism. Mild cardiac enlargement. Aortic atherosclerosis and coronary artery calcifications. Mediastinum/Nodes: Thyroid gland and trachea are unremarkable. Mild increase caliber of the air-filled proximal esophagus is  identified to the level of the carina. Below the carina there is focal dilatation of the thoracic esophagus containing food debris measuring 3.4 cm in diameter, image 87/6. This is compared with 1.3 cm above the carina. The distal esophagus just above the GE junction has a normal caliber. Underlying stricture formation cannot be excluded. No enlarged lymph nodes identified. Multiple calcified mediastinal and hilar lymph nodes are identified compatible with remote granulomatous disease. Lungs/Pleura: No pleural effusion, airspace consolidation or signs of pneumothorax. There is  mild diffuse ground-glass attenuation identified throughout both lungs. Scattered calcified granulomas are noted bilaterally. Dependent changes noted within the posterior lung bases bilaterally. Musculoskeletal: Abnormal appearance of the lower thoracic spine at the T12-L1 level. There is complete erosion of the superior endplate of S06 and much of the anterior superior aspect of the T12 vertebral body. The inferior endplate of T01 also appears eroded. There is surrounding paravertebral soft tissue stranding, image 152/6. Retropulsion bone fragments identified into the canal, image 151/6. No discrete fluid collection within the limitations of CT. Imaging findings are compatible with discitis/osteomyelitis at T11-12. Review of the MIP images confirms the above findings. CT ABDOMEN and PELVIS FINDINGS Hepatobiliary: No suspicious liver lesions. Multiple calcified granulomas noted. Gallbladder appears normal. No bile duct dilatation. Pancreas: Fatty infiltration of the pancreas. No signs of main duct dilatation, inflammation or mass. Spleen: No suspicious findings.  Numerous calcified granulomas. Adrenals/Urinary Tract: The adrenal glands are within normal limits. Bilateral renal cortical scarring and mild volume loss. Right kidney stone measures 3 mm. Benign Bosniak class 1 cyst arises off the inferior pole of the right kidney. No follow-up  imaging recommended. No hydronephrosis identified bilaterally. Urinary bladder appears normal. Stomach/Bowel: Stomach is within normal limits. No bowel wall thickening, inflammation or distension. Sigmoid diverticulosis without signs of acute inflammation. Vascular/Lymphatic: Aortic atherosclerosis. No aneurysm. No signs of abdominopelvic adenopathy. Reproductive: Prostate is unremarkable. Other: No free fluid or fluid collections identified. Musculoskeletal: Postoperative changes from L4-5 hardware fixation with solid fusion noted at L3-4 and L4-5. The disc space at L2-3 is indistinct with erosive in plate changes which are also suspicious for discitis/osteomyelitis. No significant surrounding inflammatory changes noted. Review of the MIP images confirms the above findings. IMPRESSION: 1. No evidence for acute pulmonary embolus. 2. Signs of acute discitis/osteomyelitis at T11-12 with significant endplate destruction of both vertebral bodies and retropulsion of bony fragment into the ventral spinal canal. Surrounding soft tissue stranding is identified. 3. Mild discitis/osteomyelitis is also suspected at the L2-3 level. Here, the disc space is indistinct with mild erosive changes involving the endplates. No significant surrounding inflammatory fat stranding. 4. Mild diffuse ground-glass attenuation identified throughout both lungs which may reflect mild pulmonary edema. 5. There is abnormal dilatation of the proximal and midthoracic esophagus. This is most dilated just below the level of the carina. The distal segment of the esophagus has a normal caliber. Cannot exclude distal esophageal stricture. 6. Sequelae of remote granulomatous disease. 7. Nonobstructing right renal calculus. 8. Sigmoid diverticulosis without signs of acute inflammation. 9. Aortic Atherosclerosis (ICD10-I70.0). Critical Value/emergent results were called by telephone at the time of interpretation on 06/08/2022 at 12:44 pm to provider MADISON  [facility] , who verbally acknowledged these results. Electronically Signed   By: Kerby Moors M.D.   On: 06/08/2022 12:45   CT ABDOMEN PELVIS W CONTRAST  Result Date: 06/08/2022 CLINICAL DATA:  High probability for acute pulmonary embolus. Sepsis. EXAM: CT ANGIOGRAPHY CHEST CT ABDOMEN AND PELVIS WITH CONTRAST TECHNIQUE: Multidetector CT imaging of the chest was performed using the standard protocol during bolus administration of intravenous contrast. Multiplanar CT image reconstructions and MIPs were obtained to evaluate the vascular anatomy. Multidetector CT imaging of the abdomen and pelvis was performed using the standard protocol during bolus administration of intravenous contrast. RADIATION DOSE REDUCTION: This exam was performed according to the departmental dose-optimization program which includes automated exposure control, adjustment of the mA and/or kV according to patient size and/or use of iterative reconstruction technique. CONTRAST:  67m OMNIPAQUE IOHEXOL  350 MG/ML SOLN COMPARISON:  CT [facility] from 09/20/18 FINDINGS: CTA CHEST FINDINGS Cardiovascular: Satisfactory opacification of the pulmonary arteries to the segmental level. No evidence of pulmonary embolism. Mild cardiac enlargement. Aortic atherosclerosis and coronary artery calcifications. Mediastinum/Nodes: Thyroid gland and trachea are unremarkable. Mild increase caliber of the air-filled proximal esophagus is identified to the level of the carina. Below the carina there is focal dilatation of the thoracic esophagus containing food debris measuring 3.4 cm in diameter, image 87/6. This is compared with 1.3 cm above the carina. The distal esophagus just above the GE junction has a normal caliber. Underlying stricture formation cannot be excluded. No enlarged lymph nodes identified. Multiple calcified mediastinal and hilar lymph nodes are identified compatible with remote granulomatous disease. Lungs/Pleura: No pleural effusion, airspace  consolidation or signs of pneumothorax. There is mild diffuse ground-glass attenuation identified throughout both lungs. Scattered calcified granulomas are noted bilaterally. Dependent changes noted within the posterior lung bases bilaterally. Musculoskeletal: Abnormal appearance of the lower thoracic spine at the T12-L1 level. There is complete erosion of the superior endplate of Z61 and much of the anterior superior aspect of the T12 vertebral body. The inferior endplate of W96 also appears eroded. There is surrounding paravertebral soft tissue stranding, image 152/6. Retropulsion bone fragments identified into the canal, image 151/6. No discrete fluid collection within the limitations of CT. Imaging findings are compatible with discitis/osteomyelitis at T11-12. Review of the MIP images confirms the above findings. CT ABDOMEN and PELVIS FINDINGS Hepatobiliary: No suspicious liver lesions. Multiple calcified granulomas noted. Gallbladder appears normal. No bile duct dilatation. Pancreas: Fatty infiltration of the pancreas. No signs of main duct dilatation, inflammation or mass. Spleen: No suspicious findings.  Numerous calcified granulomas. Adrenals/Urinary Tract: The adrenal glands are within normal limits. Bilateral renal cortical scarring and mild volume loss. Right kidney stone measures 3 mm. Benign Bosniak class 1 cyst arises off the inferior pole of the right kidney. No follow-up imaging recommended. No hydronephrosis identified bilaterally. Urinary bladder appears normal. Stomach/Bowel: Stomach is within normal limits. No bowel wall thickening, inflammation or distension. Sigmoid diverticulosis without signs of acute inflammation. Vascular/Lymphatic: Aortic atherosclerosis. No aneurysm. No signs of abdominopelvic adenopathy. Reproductive: Prostate is unremarkable. Other: No free fluid or fluid collections identified. Musculoskeletal: Postoperative changes from L4-5 hardware fixation with solid fusion noted  at L3-4 and L4-5. The disc space at L2-3 is indistinct with erosive in plate changes which are also suspicious for discitis/osteomyelitis. No significant surrounding inflammatory changes noted. Review of the MIP images confirms the above findings. IMPRESSION: 1. No evidence for acute pulmonary embolus. 2. Signs of acute discitis/osteomyelitis at T11-12 with significant endplate destruction of both vertebral bodies and retropulsion of bony fragment into the ventral spinal canal. Surrounding soft tissue stranding is identified. 3. Mild discitis/osteomyelitis is also suspected at the L2-3 level. Here, the disc space is indistinct with mild erosive changes involving the endplates. No significant surrounding inflammatory fat stranding. 4. Mild diffuse ground-glass attenuation identified throughout both lungs which may reflect mild pulmonary edema. 5. There is abnormal dilatation of the proximal and midthoracic esophagus. This is most dilated just below the level of the carina. The distal segment of the esophagus has a normal caliber. Cannot exclude distal esophageal stricture. 6. Sequelae of remote granulomatous disease. 7. Nonobstructing right renal calculus. 8. Sigmoid diverticulosis without signs of acute inflammation. 9. Aortic Atherosclerosis (ICD10-I70.0). Critical Value/emergent results were called by telephone at the time of interpretation on 06/08/2022 at 12:44 pm to provider MADISON [facility] , who  verbally acknowledged these results. Electronically Signed   By: Kerby Moors M.D.   On: 06/08/2022 12:45   CT Head Wo Contrast  Result Date: 06/08/2022 CLINICAL DATA:  Delirium.  Altered mental status. EXAM: CT HEAD WITHOUT CONTRAST TECHNIQUE: Contiguous axial images were obtained from the base of the skull through the vertex without intravenous contrast. RADIATION DOSE REDUCTION: This exam was performed according to the departmental dose-optimization program which includes automated exposure control, adjustment  of the mA and/or kV according to patient size and/or use of iterative reconstruction technique. COMPARISON:  07/22/2021 FINDINGS: Brain: There is no evidence for acute hemorrhage, hydrocephalus, mass lesion, or abnormal extra-axial fluid collection. No definite CT evidence for acute infarction. Diffuse loss of parenchymal volume is consistent with atrophy. Patchy low attenuation in the deep hemispheric and periventricular white matter is nonspecific, but likely reflects chronic microvascular ischemic demyelination. Tiny lacunar infarcts in the basal ganglia bilaterally. Right cerebellar lacunar infarct is stable in the interval. Vascular: No hyperdense vessel or unexpected calcification. Skull: No evidence for fracture. No worrisome lytic or sclerotic lesion. Sinuses/Orbits: The visualized paranasal sinuses and mastoid air cells are clear. Visualized portions of the globes and intraorbital fat are unremarkable. Other: None. IMPRESSION: 1. No acute intracranial abnormality. 2. Atrophy with chronic small vessel ischemic disease. Electronically Signed   By: Misty Stanley M.D.   On: 06/08/2022 12:12   DG Chest Portable 1 View  Result Date: 06/08/2022 CLINICAL DATA:  Shortness of breath. Hypoxia. Altered mental status. EXAM: PORTABLE CHEST 1 VIEW COMPARISON:  03/06/2017 FINDINGS: Chronic cardiomegaly. Pacemaker with lead in the region of the right ventricle. The lungs are clear without infiltrate, collapse or effusion. Few scattered calcified granulomas. IMPRESSION: No active disease.  Cardiomegaly and pacemaker. Electronically Signed   By: Nelson Chimes M.D.   On: 06/08/2022 10:28    Pending Labs Unresulted Labs (From admission, onward)     Start     Ordered   06/10/22 0500  Heparin level (unfractionated)  Daily at 5am,   R     See Hyperspace for full Linked Orders Report.   06/08/22 1834   06/10/22 0500  APTT  Daily at 5am,   R      06/08/22 1834   06/09/22 1500  APTT  Once-Timed,   TIMED        06/09/22  0555   06/09/22 0500  CBC  Daily at 5am,   R     See Hyperspace for full Linked Orders Report.   06/08/22 1834   06/08/22 0913  Urine Culture  Once,   URGENT       Question:  Indication  Answer:  Altered mental status (if no other cause identified)   06/08/22 0912   06/08/22 0912  Blood gas, venous (at [facility] and [facility], not at [facility])  Once,   R        06/08/22 0912            Vitals/Pain Today's Vitals   06/09/22 1000 06/09/22 1015 06/09/22 1034 06/09/22 1036  BP: (!) 166/91 (!) 167/95 (!) 182/133   Pulse: (!) 109 (!) 110 (!) 122   Resp: (!) _0 Temp:   98.3 F (36.8 C) 98.7 F (37.1 C)  TempSrc:   Oral Axillary  SpO2: 100% 100% 98%   Weight:      Height:      PainSc:        Isolation Precautions No active isolations  Medications Medications  insulin aspart (novoLOG) injection 0-9 Units (1 Units Subcutaneous Given 06/09/22 0851)  vancomycin (VANCOREADY) IVPB 750 mg/150 mL (has no administration in time range)  0.9 %  sodium chloride infusion (0 mLs Intravenous Stopped 06/08/22 2259)  acetaminophen (TYLENOL) tablet 650 mg (has no administration in time range)    Or  acetaminophen (TYLENOL) suppository 650 mg (has no administration in time range)  HYDROmorphone (DILAUDID) injection 0.5 mg (0.5 mg Intravenous Given 06/09/22 0854)  metoprolol tartrate (LOPRESSOR) injection 5 mg (5 mg Intravenous Given 06/08/22 2003)  ceFEPIme (MAXIPIME) 2 g in sodium chloride 0.9 % 100 mL IVPB (0 g Intravenous Stopped 06/09/22 1022)  diltiazem (CARDIZEM CD) 24 hr capsule 180 mg (180 mg Oral Patient Refused/Not Given 06/09/22 0942)  pantoprazole (PROTONIX) injection 40 mg (40 mg Intravenous Given 06/08/22 1906)  gabapentin (NEURONTIN) capsule 100 mg (100 mg Oral Patient Refused/Not Given 06/09/22 0941)  heparin ADULT infusion 100 units/mL (25000 units/288m) (800 Units/hr Intravenous New Bag/Given 06/09/22 0658)  lactated ringers bolus 1,000 mL (0 mLs Intravenous Stopped 06/08/22 1317)  iohexol  (OMNIPAQUE) 350 MG/ML injection 80 mL (80 mLs Intravenous Contrast Given 06/08/22 1204)  diltiazem (CARDIZEM) injection 10 mg (10 mg Intravenous Given 06/08/22 1253)  piperacillin-tazobactam (ZOSYN) IVPB 3.375 g (0 g Intravenous Stopped 06/08/22 1450)  vancomycin (VANCOREADY) IVPB 1500 mg/300 mL (0 mg Intravenous Stopped 06/08/22 1758)  fentaNYL (SUBLIMAZE) injection 50 mcg (50 mcg Intravenous Given 06/08/22 1314)    Mobility non-ambulatory High fall risk   Focused Assessments Neuro Assessment Handoff:  Swallow screen pass? No    NIH Stroke Scale ( + Modified Stroke Scale Criteria)  LOC Questions (1b. )   +: Answers one question correctly (Patinet alert to person. able to state his name and birth month. Patient appears very stuporous and unable to keep his eyes open. Patient not able to follow commands at this time.) Visual (3. )  +:  (Patient able to move all extremities without difficulty when trying to get adjuseted in the bed. patinet speaking words but cannot make full sentences or follow commands)     Neuro Assessment: Exceptions to WDL Neuro Checks:      Last Documented NIHSS Modified Score:   Has TPA been given? No If patient is a Neuro Trauma and patient is going to OR before floor call report to [facility]: [phone] [phone]  R Recommendations: See Admitting Provider Note

## 2022-06-09 NOTE — Hospital Course (Signed)
87 year old man lives at home with his wife, PMH right AKA, wheelchair dependent, mentally sharp, presented with sudden onset of confusion and worsening low back pain.  Work-up revealed acute discitis and osteomyelitis of the thoracic and lumbar spine.  On empiric antibiotics, neurosurgery, neurology, infectious disease and palliative medicine involved in this complex case.

## 2022-06-09 NOTE — Progress Notes (Signed)
EEG complete - results pending 

## 2022-06-09 NOTE — Procedures (Addendum)
Patient Name: [name]  MRN: [id number]  Epilepsy Attending: Lora Havens  Referring Physician/Provider: Samuella Cota, MD  Date: 06/09/2022 Duration: 21.22 mins  Patient history: 87 year old male with altered mental status.  EEG to evaluate for seizure.  Level of alertness: Awake  AEDs during EEG study: None  Technical aspects: This EEG study was done with scalp electrodes positioned according to the 10-20 International system of electrode placement. Electrical activity was acquired at a sampling rate of '500Hz'$  and reviewed with a high frequency filter of '70Hz'$  and a low frequency filter of '1Hz'$ . EEG data were recorded continuously and digitally stored.   Description: No clear posterior dominant rhythm was seen.  EEG showed continuous generalized 5 to 7 Hz theta slowing.  Intermittent generalized periodic discharges with triphasic morphology at 1.5 to 2 Hz were also noted , more frequent when awake/stimulated. Hyperventilation and photic stimulation were not performed.     ABNORMALITY - Periodic discharges with triphasic morphology, generalized ( GPDs) - Continuous slow, generalized  IMPRESSION: This study showed generalized periodic discharges with triphasic morphology at 1.5 to 2 Hz which is on the ictal-interictal continuum with increased risk for seizures.  However, the frequency, morphology and reactivity to stimulation is more commonly indicative of toxic-metabolic causes like cefepime toxicity.  Additionally there is moderate diffuse encephalopathy, nonspecific etiology.  No definite seizures were seen during this study.  If concern for ictal-interictal activity persists, please consider prolonged EEG monitoring.  [name] Barbra Sarks

## 2022-06-09 NOTE — Evaluation (Signed)
Clinical/Bedside Swallow Evaluation Patient Details  Name: [name] MRN: [id number] Date of Birth: 1935/10/02  Today's Date: 06/09/2022 Time: SLP Start Time (ACUTE ONLY): 63 SLP Stop Time (ACUTE ONLY): 1138 SLP Time Calculation (min) (ACUTE ONLY): 15 min  Past Medical History:  Past Medical History:  Diagnosis Date   Atrial fibrillation ([place])    permanent   Backache, unspecified    Cellulitis and abscess of leg, except foot    CHF (congestive heart failure) ([place])    Diverticulosis 05-2006   Dr. [name]    Esophageal stricture    Fatigue    GERD (gastroesophageal reflux disease)    Gout, unspecified    Hx of colonic polyps 05-2006   (Adenomatous)Dr. [name]   Hx of radiation therapy 06/16/14- 07/18/14   mult squamous cell carcinomas of scalp   Hypertension    Long term (current) use of anticoagulants    Overweight(278.02)    Pacemaker- [name] 02/20/2013   Phlebitis and thrombophlebitis of superficial vessels of lower extremities    Right bundle branch block    Skin cancer    squamous cell carcinoma scalp   Syncope and collapse    Tachycardia-bradycardia (HCC)    s/p PPM   Type II or unspecified type diabetes mellitus without mention of complication, not stated as uncontrolled    Unspecified venous (peripheral) insufficiency    Past Surgical History:  Past Surgical History:  Procedure Laterality Date   ABOVE KNEE LEG AMPUTATION Right 04/02/2014   DR DUDA   AMPUTATION Right 01/20/2014   Procedure: AMPUTATION BELOW KNEE- right;  Surgeon: [name], MD;  Location: [place];  Service: Orthopedics;  Laterality: Right;  Right Below Knee Amputation   AMPUTATION Right 02/14/2014   Procedure: AMPUTATION BELOW KNEE;  Surgeon: [name], MD;  Location: [place];  Service: Orthopedics;  Laterality: Right;  Right Below Knee Amputation Revision   AMPUTATION Right 03/07/2014   Procedure: AMPUTATION BELOW KNEE;  Surgeon: [name], MD;  Location: [place];  Service: Orthopedics;   Laterality: Right;  Right Below Knee Amputation Revision   AMPUTATION Right 03/18/2014   Procedure: revision of  AMPUTATION BELOW KNEE;  Surgeon: [name], MD;  Location: [place];  Service: Orthopedics;  Laterality: Right;  Revision Right Below Knee Amputation   AMPUTATION Right 04/02/2014   Procedure: AMPUTATION ABOVE KNEE;  Surgeon: [name], MD;  Location: [place];  Service: Orthopedics;  Laterality: Right;  Right Above Knee Amputation   EYE SURGERY     I & D EXTREMITY Right 01/17/2014   Procedure: IRRIGATION AND DEBRIDEMENT RIGHT HEEL  WITH CULTURES AND BONE BIOPSY, placement of wound vac;  Surgeon: Theodoro Kos, DO;  Location: [place];  Service: Plastics;  Laterality: Right;   LAMINECTOMY     LUMBAR FUSION     PACEMAKER INSERTION  8/12   SJM by [name] for tachy/brady syndrome   ROTATOR CUFF REPAIR     ruptured rt rectus muscle     SKIN BIOPSY     scalp   HPI:  87 year old man lives at home with his wife, PMH esophageal stricture, "can't exclude esohpageal stricture on CT" right AKA, wheelchair dependent, mentally sharp, presented with sudden onset of confusion and worsening low back pain. Work-up revealed acute discitis and osteomyelitis of the thoracic and lumbar spine. Per chart, neurosurgery, neurology, infectious disease and palliative medicine involved in this complex case. Cannot have MRI due to pacemaker.    Assessment / Plan /  Recommendation  Clinical Impression  Pt seen for very limited swallow assessment in Emergency department. He was not cooperative with oromotor assessment and unable to view oral cavity. Vocal quality is clear with good intensity. He accepted bite applesauce then spat bolus onto gown. He stated he wanted water and was unable to efffectively use straw. When siphoned into oral cavity he spat this out and proceeded to expectorate mild amount of mucous mutiple times during further attempts with water. Pt confused and wasn't redirectible at that time. Continue  NPO status with oral care. If he has oral meds, RN can attempt them crushed in applesauce until ST returns tomorrow. SLP Visit Diagnosis: Dysphagia, unspecified (R13.10)    Aspiration Risk  Mild aspiration risk;Moderate aspiration risk    Diet Recommendation NPO        Other  Recommendations Oral Care Recommendations: Oral care QID    Recommendations for follow up therapy are one component of a multi-disciplinary discharge planning process, led by the attending physician.  Recommendations may be updated based on patient status, additional functional criteria and insurance authorization.  Follow up Recommendations  (TBD)      Assistance Recommended at Discharge Frequent or constant Supervision/Assistance  Functional Status Assessment Patient has had a recent decline in their functional status and demonstrates the ability to make significant improvements in function in a reasonable and predictable amount of time.  Frequency and Duration min 2x/week  2 weeks       Prognosis Prognosis for Safe Diet Advancement: Good Barriers to Reach Goals: Cognitive deficits;Behavior      Swallow Study   General Date of Onset: 06/08/22 HPI: 87 year old man lives at home with his wife, PMH esophageal stricture, "can't exclude esohpageal stricture on CT" right AKA, wheelchair dependent, mentally sharp, presented with sudden onset of confusion and worsening low back pain. Work-up revealed acute discitis and osteomyelitis of the thoracic and lumbar spine. Per chart, neurosurgery, neurology, infectious disease and palliative medicine involved in this complex case. Cannot have MRI due to pacemaker. Type of Study: Bedside Swallow Evaluation Previous Swallow Assessment:  (no) Diet Prior to this Study: NPO Temperature Spikes Noted: No Respiratory Status: Room air History of Recent Intubation: No Behavior/Cognition: Alert;Confused;Requires cueing;Distractible;Doesn't follow directions Oral Cavity  Assessment:  (unable to fully view) Oral Care Completed by SLP: No Oral Cavity - Dentition:  (unable to fully view) Vision: Functional for self-feeding Self-Feeding Abilities: Needs assist Patient Positioning: Upright in bed Baseline Vocal Quality: Normal Volitional Cough: Cognitively unable to elicit Volitional Swallow: Unable to elicit    Oral/Motor/Sensory Function Overall Oral Motor/Sensory Function:  (unable to assess, no overt deficits)   Ice Chips Ice chips: Not tested   Thin Liquid Thin Liquid:  (spat out siphoned water)    Nectar Thick Nectar Thick Liquid: Not tested   Honey Thick Honey Thick Liquid: Not tested   Puree Puree:  (spat out applesauce)   Solid     Solid: Not tested      Houston Siren 06/09/2022,2:38 PM

## 2022-06-09 NOTE — Consult Note (Signed)
WOC Nurse Consult Note: Reason for Consult:Left medial heel wound Left plantar foot, near heel bruising Ecchymosis to bilateral arms and thighs.  Patient self transfers at home. Right BKA site is intact but has 1 cm round bruising near healed suture line (scar)  Wound type: trauma (bruising) and pressure to left medial heel.  Pressure Injury POA: Yes Measurement: Left heel 0.2 cm scabbed lesion, dry Left plantar heel bruise 1.5 cm x 1 cm intact discoloration Wound PHX:TAVWPVX left heel Drainage (amount, consistency, odor) none Periwound: Dry skin-  left leg and R BKA with intact bruising Bruising to bilateral arms.  Due to pain and confusion, it is difficult to turn patient.  I assess his buttocks but my view is somewhat limited due to this. I do not appreciate any breakdown to buttocks in my limited view but asked the bedside RN to page me if he finds any nonintact skin while turning to provide care.  Dressing procedure/placement/frequency: Heel wound open to air.  Apply Prevalon boot.  Turn and reposition every two hours.  Will not follow at this time.  Please re-consult if needed.  Domenic Moras MSN, RN, FNP-BC CWON Wound, Ostomy, Continence Nurse Pager [phone]

## 2022-06-10 ENCOUNTER — Inpatient Hospital Stay (HOSPITAL_COMMUNITY): Payer: Medicare Other

## 2022-06-10 DIAGNOSIS — N179 Acute kidney failure, unspecified: Secondary | ICD-10-CM | POA: Diagnosis not present

## 2022-06-10 DIAGNOSIS — R5381 Other malaise: Secondary | ICD-10-CM

## 2022-06-10 DIAGNOSIS — G9341 Metabolic encephalopathy: Secondary | ICD-10-CM | POA: Diagnosis not present

## 2022-06-10 DIAGNOSIS — M869 Osteomyelitis, unspecified: Secondary | ICD-10-CM | POA: Diagnosis not present

## 2022-06-10 DIAGNOSIS — M4646 Discitis, unspecified, lumbar region: Secondary | ICD-10-CM | POA: Diagnosis not present

## 2022-06-10 DIAGNOSIS — I4821 Permanent atrial fibrillation: Secondary | ICD-10-CM

## 2022-06-10 DIAGNOSIS — E089 Diabetes mellitus due to underlying condition without complications: Secondary | ICD-10-CM | POA: Diagnosis not present

## 2022-06-10 DIAGNOSIS — I5032 Chronic diastolic (congestive) heart failure: Secondary | ICD-10-CM | POA: Diagnosis not present

## 2022-06-10 LAB — CBC
HCT: 39.3 % (ref 39.0–52.0)
Hemoglobin: 12.7 g/dL — ABNORMAL LOW (ref 13.0–17.0)
MCH: 27.5 pg (ref 26.0–34.0)
MCHC: 32.3 g/dL (ref 30.0–36.0)
MCV: 85.1 fL (ref 80.0–100.0)
Platelets: 309 10*3/uL (ref 150–400)
RBC: 4.62 MIL/uL (ref 4.22–5.81)
RDW: 14.7 % (ref 11.5–15.5)
WBC: 11 10*3/uL — ABNORMAL HIGH (ref 4.0–10.5)
nRBC: 0 % (ref 0.0–0.2)

## 2022-06-10 LAB — GLUCOSE, CAPILLARY
Glucose-Capillary: 135 mg/dL — ABNORMAL HIGH (ref 70–99)
Glucose-Capillary: 121 mg/dL — ABNORMAL HIGH (ref 70–99)
Glucose-Capillary: 92 mg/dL (ref 70–99)
Glucose-Capillary: 118 mg/dL — ABNORMAL HIGH (ref 70–99)

## 2022-06-10 LAB — FOLATE: Folate: 32.4 ng/mL (ref >5.9)

## 2022-06-10 LAB — APTT: aPTT: 95 seconds — ABNORMAL HIGH (ref 24–36)

## 2022-06-10 LAB — HEPARIN LEVEL (UNFRACTIONATED): Heparin Unfractionated: 0.75 IU/mL — ABNORMAL HIGH (ref 0.30–0.70)

## 2022-06-10 MED ORDER — POTASSIUM CHLORIDE 10 MEQ/100ML IV SOLN
10.0000 meq | INTRAVENOUS | Status: AC
  Administered 2022-06-10 (×6): 10 meq via INTRAVENOUS
  Filled 2022-06-10 (×6): qty 100

## 2022-06-10 MED ORDER — VITAMIN B-12 1000 MCG PO TABS: 1000.0000 ug | ORAL_TABLET | Freq: Every day | ORAL | Status: DC

## 2022-06-10 MED ORDER — SODIUM CHLORIDE 0.9 % IV SOLN
2.0000 g | Freq: Two times a day (BID) | INTRAVENOUS | Status: DC
  Administered 2022-06-10 – 2022-06-12 (×5): 2 g via INTRAVENOUS
  Filled 2022-06-10 (×5): qty 2

## 2022-06-10 MED ORDER — CYANOCOBALAMIN 1000 MCG/ML IJ SOLN
1000.0000 ug | Freq: Every day | INTRAMUSCULAR | Status: DC
  Administered 2022-06-10 – 2022-06-15 (×6): 1000 ug via INTRAMUSCULAR
  Filled 2022-06-10 (×6): qty 1

## 2022-06-10 MED ORDER — HEPARIN SODIUM (PORCINE) 5000 UNIT/ML IJ SOLN
5000.0000 [IU] | Freq: Three times a day (TID) | INTRAMUSCULAR | Status: DC
  Administered 2022-06-10 – 2022-06-12 (×6): 5000 [IU] via SUBCUTANEOUS
  Filled 2022-06-10 (×6): qty 1

## 2022-06-10 MED ORDER — SODIUM CHLORIDE 0.9 % IV SOLN
1.0000 g | Freq: Two times a day (BID) | INTRAVENOUS | Status: DC
  Filled 2022-06-10: qty 20

## 2022-06-10 NOTE — Progress Notes (Signed)
Speech Language Pathology Treatment: Dysphagia  Patient Details Name: [name] MRN: [id number] DOB: 08/04/1935 Today's Date: 06/10/2022 Time: 6962-9528 SLP Time Calculation (min) (ACUTE ONLY): 16 min  Assessment / Plan / Recommendation Clinical Impression  Pt was seen for skilled ST targeting PO trials.  He was encountered awake/alert in bed and was pleasantly confused.  He was seen with trials of thin liquid via straw and regular solids.  He was able to drink and feed himself with mild assistance.  He was noted to take small bites of regular solids and stated that he preferred soft solids.  AP transit appeared timely with liquids and solids, and no overt s/sx of aspiration were noted with either consistency.  Eructation was noted following all liquid trials, likely secondary to hx of esophageal stricture.  Recommend initiation of Dysphagia 3 (soft) solids and thin liquids with medication administered whole in puree.  SLP will briefly f/u to monitor diet tolerance.     HPI HPI: 87 year old man lives at home with his wife, PMH esophageal stricture, "can't exclude esohpageal stricture on CT" right AKA, wheelchair dependent, mentally sharp, presented with sudden onset of confusion and worsening low back pain. Work-up revealed acute discitis and osteomyelitis of the thoracic and lumbar spine. Per chart, neurosurgery, neurology, infectious disease and palliative medicine involved in this complex case. Cannot have MRI due to pacemaker.      SLP Plan  Continue with current plan of care      Recommendations for follow up therapy are one component of a multi-disciplinary discharge planning process, led by the attending physician.  Recommendations may be updated based on patient status, additional functional criteria and insurance authorization.    Recommendations  Diet recommendations: Dysphagia 3 (mechanical soft);Thin liquid Liquids provided via: Straw Medication Administration: Whole meds  with puree Supervision: Staff to assist with self feeding;Intermittent supervision to cue for compensatory strategies Compensations: Minimize environmental distractions;Slow rate;Small sips/bites Postural Changes and/or Swallow Maneuvers: Seated upright 90 degrees;Upright 30-60 min after meal                Oral Care Recommendations: Oral care BID Follow Up Recommendations: Long-term institutional care without follow-up therapy Assistance recommended at discharge: Frequent or constant Supervision/Assistance SLP Visit Diagnosis: Dysphagia, unspecified (R13.10) Plan: Continue with current plan of care          Bretta Bang, M.S., [place] Office: [phone]  [place]  06/10/2022, 9:45 AM

## 2022-06-10 NOTE — Care Management Important Message (Signed)
Important Message  Patient Details  Name: [name] MRN: [id number] Date of Birth: 25-Aug-1935   Medicare Important Message Given:  Yes Due to illness patient did not sign patient was not able to t understand the purpose of the IM .   Unsigned copy left on the patient bedside.     Ailene Royal 06/10/2022, 4:31 PM

## 2022-06-10 NOTE — Progress Notes (Signed)
ANTICOAGULATION CONSULT NOTE- follow-up  Pharmacy Consult for Heparin Indication: atrial fibrillation  Allergies  Allergen Reactions   Morphine Rash and Other (See Comments)    Irritability also   Statins Other (See Comments)    dizzy    Patient Measurements: Height: '5\' 10"'$  (177.8 cm) Weight: 72.6 kg (160 lb) IBW/kg (Calculated) : 73  Heparin Dosing Weight: 73 kg  Vital Signs: Temp: 99 F (37.2 C) (07/21 0725) Temp Source: Oral (07/21 0725) BP: 152/88 (07/21 0725) Pulse Rate: 104 (07/21 0725)  Labs: Recent Labs    06/08/22 0928 06/08/22 1022 06/08/22 1124 06/09/22 0359 06/09/22 1439 06/10/22 0529  HGB 13.0 13.3  --  12.1*  --  12.7*  HCT 41.0 39.0  --  36.6*  --  39.3  PLT 303  --   --  295  --  309  APTT 32  --   --  169* 88* 95*  LABPROT 15.3*  --   --   --   --   --   INR 1.2  --   --   --   --   --   HEPARINUNFRC  --   --   --  >1.10*  --  0.75*  CREATININE 1.74*  --   --  1.70*  --   --   TROPONINIHS 11  --  11  --   --   --      Estimated Creatinine Clearance: 33.2 mL/min (A) (by C-G formula based on SCr of 1.7 mg/dL (H)).   Assessment: 83 YOM with medical history significant for afib on Eliquis PTA who presented with AMS and failed swallow evaluation. Pharmacy consulted to manage heparin while apixaban on hold.  Will monitor with aPTT, anticipate heparin level will be falsely elevated due to recent apixaban dose. aPTT within goal range at 95 on heparin 800 units/hr. No issues with heparin infusion or signs of bleeding reported. CBC wnl.   Goal of Therapy:  Heparin level 0.3-0.7 units/ml aPTT 66-102 seconds Monitor platelets by anticoagulation protocol: Yes   Plan:  Continue heparin infusion at 800 units/hr Check heparin level and aPTT daily while on heparin until correlate, then monitor heparin levels only Continue to monitor H&H and platelets    Thank you for allowing pharmacy to be a part of this patient's care.  Vaughan Basta BS, PharmD,  BCPS Clinical Pharmacist 06/10/2022 7:40 AM  Contact: [phone] after 3 PM  "Be curious, not judgmental..." -Jamal Maes

## 2022-06-10 NOTE — Progress Notes (Addendum)
Progress Note   Patient: [name] MEQ:[id number] DOB: [birth year] DOA: 06/08/2022     2 DOS: the patient was seen and examined on 06/10/2022   Brief hospital course: 87 year old man lives at home with his wife, PMH right AKA, wheelchair dependent, mentally sharp, presented with sudden onset of confusion and worsening low back pain.  Work-up revealed acute discitis and osteomyelitis of the thoracic and lumbar spine.  On empiric antibiotics, neurosurgery, neurology, infectious disease and palliative medicine involved in this complex case.  Assessment and Plan: * Acute discitis/osteomyelitis of thoracic/lumbar region of spine  -- cannot get MRI secondary to pacemaker.  CT showedacute discitis/osteomyelitis at T11-12 with significant endplate destruction and retropulsion of bony fragment into ventral spinal canal. Mild discitis/OM suspected at L2-3 level.  However he has a history of back abnormalities on imaging and infection is somewhat less than definite. -- Much better able to comply with exam today.  Moves left leg well, right stump well.  Acute on chronic back pain noted. --Continue empiric antibiotics as per infectious disease.  I doubt cefepime playing a role given the patient's encephalopathy was present on admission. --Patient status post cortisone injections of the low back 7/14.  I gave her 56 notified orthopedic surgeon. --I discussed with Dr. Christella Noa this evening.  Clamshell brace would be best but he does not think the patient can tolerate, therefore he recommends TLSO and can mobilize.  He is not a candidate for any intervention at this point.  However if he substantially improves, he can follow-up in the office for consideration of surgical intervention. --Palliative care also involved.  Acute metabolic encephalopathy -- Bizarre affect= on admission, now much improved.  Follows commands, alert and oriented to self, location, month and year.  Still perseverates and has some word  finding difficulties. -- No fever or history thereof.  No leukocytosis, initial imaging did not suggest infection other than in spine. -- CT head no acute abnormalities.  MRI not obtainable secondary to pacemaker. -- Was on oxycodone for chronic pain, could consider adverse drug reaction or accidental misuse, however presentation does not suggest narcotic effect. -- Spot EEG unremarkable.  Long-term video monitoring EEG in process.  [place] neurology.   AKI (acute kidney injury) ([place]) superimposed on CKD stage IIIb --Baseline creatinine appears around 1.4-1.5 --appears stable now  Chronic diastolic CHF (congestive heart failure) (HCC) --LVEF 29-79%, diastolic function could not be determined.  --no obvious valve abnormalities  Atrial fibrillation (HCC) --Cha2ds2-vasc of 7, will hold apixaban until workup and treatment plan clear --Rate control with metoprolol.  Heparin.   Diabetes mellitus (HCC) --A1C 6.4 --SSI. Has not needed long-acting insulin at home.  Essential hypertension --monitor  GERD --Continue PPI   ESOPHAGEAL STRICTURE --History of esophageal stricture and dysphagia. Can't exclude distal esophageal stricture on CT; if clinically improves, consider consulting GI.  Improved mentation.  Diet per speech therapy.        Subjective:  Feels ok today Per nursing seems to be better but still not back to baseline  Physical Exam: Vitals:   06/10/22 0534 06/10/22 0725 06/10/22 1125 06/10/22 1604  BP: (!) 146/88 (!) 152/88 (!) 198/84 (!) 157/63  Pulse: (!) 105 (!) 104 100 77  Resp:  '20 19 19  '$ Temp:  99 F (37.2 C) 98.2 F (36.8 C) 99.4 F (37.4 C)  TempSrc:  Oral Oral Oral  SpO2:  100% 100% 100%  Weight:      Height:       Physical Exam  Vitals reviewed.  Constitutional:      General: He is not in acute distress.    Appearance: He is not ill-appearing or toxic-appearing.  Cardiovascular:     Rate and Rhythm: Normal rate. Rhythm irregular.     Heart  sounds: No murmur heard.    Comments: Telemetry afib Pulmonary:     Effort: Pulmonary effort is normal. No respiratory distress.     Breath sounds: No wheezing, rhonchi or rales.  Musculoskeletal:     Comments: No gross motor deficits noted; able to lift left leg off bed and right stump off bed  Neurological:     Mental Status: He is alert.  Psychiatric:        Mood and Affect: Mood normal.     Comments: Speech is fluent and 50% appropriate; oriented to self, location, month, year, president, hospital     Data Reviewed:  CBG stable   Family Communication: left message for wife  Disposition: Status is: Inpatient Remains inpatient appropriate because: workup in progress  Planned Discharge Destination:  TBD    Time spent: 40 minutes  Author: Murray Hodgkins, MD 06/10/2022 7:13 PM  For on call review [email or web address].

## 2022-06-10 NOTE — Progress Notes (Signed)
LTM EEG hooked up and running - no initial skin breakdown - push button tested - neuro notified. [facility] monitoring.  

## 2022-06-10 NOTE — Progress Notes (Signed)
Pharmacy Antibiotic Note  [name] is a 87 y.o. male admitted on 06/08/2022 with  osteomyelitis .  Pharmacy has been consulted for Merrem dosing.  Plan: Merrem 1 gram iv q12h  Height: '5\' 10"'$  (177.8 cm) Weight: 72.6 kg (160 lb) IBW/kg (Calculated) : 73  Temp (24hrs), Avg:98.5 F (36.9 C), Min:97.9 F (36.6 C), Max:99 F (37.2 C)  Recent Labs  Lab 06/08/22 0928 06/08/22 1124 06/09/22 0359 06/10/22 0529  WBC 8.6  --  10.3 11.0*  CREATININE 1.74*  --  1.70*  --   LATICACIDVEN 1.3 1.5  --   --     Estimated Creatinine Clearance: 33.2 mL/min (A) (by C-G formula based on SCr of 1.7 mg/dL (H)).    Allergies  Allergen Reactions   Morphine Rash and Other (See Comments)    Irritability also   Statins Other (See Comments)    dizzy    Antimicrobials this admission: 7/19 cefepime >> 7/21 7/19 Zosyn >> X1 dose only 7/19 Vanc >> 7/21 Merrem >>  Dose adjustments this admission: Merrem dosed for CRCL 33.2 ml/min (1 gram iv q12h)  Microbiology results: 7/19 BCx: ngtd [D3] 7/19 UCx: ngtdF   Thank you for allowing pharmacy to be a part of this patient's care.  Vaughan Basta BS, PharmD, BCPS Clinical Pharmacist 06/10/2022 9:13 AM  Contact: [phone] after 3 PM  "Be curious, not judgmental..." -Jamal Maes

## 2022-06-10 NOTE — Progress Notes (Signed)
Neurology Progress Note  Brief HPI: Patient with hx HTN, a-fib on Eliquis, right AKA, DM, CHF, CKD, spinal surgery,  sick sinus syndrome s/p pacer presented to the ED with AMS.  At baseline, he is alert and oriented with occasional problems with recall of remote events but no confusion.  He reportedly went out to his car to get groceries and was found by his wife with AMS, perseverating on different words and unable to get out of his wheelchair.  He was noted to be hypoxic by EMS. Patient did c/o back pain and there was concern for thoracic/lumbar osteomyelitis and discitis.  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    Infectious disease notes that his inflammatory markers are not significantly elevated, he has no fever or leukocytosis and it is unclear whether he has infection currently versus pain from a compression fracture    Subjective: Patient is alert and more conversant today.  He is able to say that his back does not hurt and is aware that he is in the hospital.  Exam: Vitals:   06/10/22 0534 06/10/22 0725  BP: (!) 146/88 (!) 152/88  Pulse: (!) 105 (!) 104  Resp:  20  Temp:  99 F (37.2 C)  SpO2:  100%   Gen: In bed, NAD Resp: non-labored breathing, no acute distress Abd: soft, nt  Neuro: Mental Status: alert and oriented to person, place and month but not year.  3/3 registration, 1/3 recall, able to name 6 animals when asked Cranial Nerves:PERRL, EOMI, facial sensation and movement symmetrical, hearing intact to voice, phonation normal, tongue midline Motor: 4/5 strength in BUE and 3/5 strength in LUE Sensory:sensation intact and symmetrical to all four extremities Coordination: FNF intact bilaterally Gait: Deferred  Pertinent Labs: CBC    Component Value Date/Time   WBC 11.0 (H) 06/10/2022 0529   RBC 4.62 06/10/2022 0529   HGB 12.7 (L) 06/10/2022 0529   HGB 10.7 (L) 04/28/2021 1403    HCT 39.3 06/10/2022 0529   HCT 32.5 (L) 04/28/2021 1403   PLT 309 06/10/2022 0529   PLT 211 04/28/2021 1403   MCV 85.1 06/10/2022 0529   MCV 83 04/28/2021 1403   MCH 27.5 06/10/2022 0529   MCHC 32.3 06/10/2022 0529   RDW 14.7 06/10/2022 0529   RDW 13.6 04/28/2021 1403   LYMPHSABS 1.2 06/08/2022 0928   LYMPHSABS 1.2 08/21/2020 1344   MONOABS 0.5 06/08/2022 0928   EOSABS 0.0 06/08/2022 0928   EOSABS 0.2 08/21/2020 1344   BASOSABS 0.0 06/08/2022 0928   BASOSABS 0.1 08/21/2020 1344       Latest Ref Rng & Units 06/09/2022    3:59 AM 06/08/2022   10:22 AM 06/08/2022    9:28 AM  BMP  Glucose 70 - 99 mg/dL 151   163   BUN 8 - 23 mg/dL 21   21   Creatinine 0.61 - 1.24 mg/dL 1.70   1.74   Sodium 135 - 145 mmol/L 140  139  140   Potassium 3.5 - 5.1 mmol/L 3.2  3.5  3.6   Chloride 98 - 111 mmol/L 106   100   CO2 22 - 32 mmol/L 22   25   Calcium 8.9 - 10.3 mg/dL 9.2   10.1    Folate 32.4  EEG findings are consistent with generalized periodic discharges consistent with toxic metabolic causes neurotoxicity and show no definite seizure activity.   Imaging Reviewed:  CT Head:  No acute abnormality  Assessment: Patient with history of HTN, a-fib on Eliquis, right AKA, DM, CHF, CKD, sick sinus syndrome s/p pacer presented with altered mental status.  On presentation, he was quite altered and perseverating on different words and phrases and complaining of severe back pain.  Patient is normally alert, oriented and active at baseline.  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  AMS is likely due to delirium from acute pain, possible infection,  hospitalization, compounded by cefepime neurotoxicity, but we will obtain prolonged EEG to assess for the possibility of seizure as the inciting event of his altered mental status.    Fortunately his mental status is improving which could be secondary to treatment of his pain / possible infection versus clearing of an postictal state. Some memory deficits and cognitive dysfunction seen on exam, and family reports that he still does not recognize some family friends.  Impression: Delirium likely due to infection and cefepime neurotoxicity with c/f seizure activity causing AMS  Recommendations: 1) Continuous EEG overnight to rule out seizure activity or epileptogenic focus 2) Defer treatment of possible discitis/osteomyelitis to ID recommendations, appreciate discontinuation of cefepime 3) appreciate palliative care consult following and management of comorbidities by primary team 4) balance pain control with judicious use of pain medication given both can be deliriogenic  Cortney E Carron Curie , MSN, AGACNP-BC [facility] See Amion for schedule and pager information 06/10/2022 12:33 PM   Attending Neurologist's note:  I personally saw this patient, gathering history, performing a neurologic examination, reviewing relevant labs, personally reviewing relevant imaging including Head CT, and formulated the assessment and plan, adding the note above for completeness and clarity to accurately reflect my thoughts

## 2022-06-10 NOTE — Progress Notes (Signed)
Daily Progress Note   Patient Name: [name]       Date: 06/10/2022 DOB: [birth year]  Age: 87 y.o. MRN#: [id number] Attending Physician: Samuella Cota, MD Primary Care Physician: Seward Carol, MD Admit Date: 06/08/2022  Reason for Consultation/Follow-up: Establishing goals of care  Subjective: Chart review performed. Received report from primary RN - no acute concerns. RN states patient's pain is better managed today after dose change yesterday. RN reports patient is still confused, not eating, drinking well.  Went to visit patient at bedside - no family/visitors present. [name], alert, oriented to name/date/place questions, disoriented to situation, his thoughts are very scattered and he has lack of safety awareness. He feels hospital staff are not letting his family come to visit him despite reassurance they can come anytime. Patient also requests he be checked on often to ensure "he's not choking." EEG study being performed. Patient denies pain at this time - he tells me his pain medication has "helped tremendously."   3:23 PM Attempted to call patient's wife/Ann to discuss diagnosis, prognosis, GOC, EOL wishes, disposition, and options - no answer - confidential voicemail left and PMT phone number provided with request to return call.  3:46 PM Received notification Lelon Frohlich returned call.  4:33 PM Called Ann - emotional support provided. Provided updates per my and RN assessment today. Provided medical updates - reviewed AMS still has unclear etiology but medical team was considering it's likely delirium from acute pain from compression fracture, possible infection, hospitalization, compounded by neurotoxicity from cefepime. Reviewed medication changes  yesterday. Education provided on EEG study - explained indication and goals for monitoring. Reviewed SLP evaluation results and recommendations - education provided on what aspiration is and how it causes increased risk of infection. Discussed dysphagia in context of his history of esophageal stricture and current AMS. Therapeutic listening provided as Lelon Frohlich reflects on how sudden this change was for patient and how hard it has been for family. Ann reviews the patient's progressive weakness, telling me how prior to hospitalization he couldn't even stand "two inches" for transferring.   Lelon Frohlich is still open to the idea of transition to full comfort if no improvement is seen/if decline. Goal at this time is to continue current treatment with watchful waiting.   Chaplain support offered - Lelon Frohlich states  she "has a great church connection" as well as a great relationship with a nurse reverend - she declines chaplain at this time but is appreciative.  All questions and concerns addressed. Encouraged to call with questions and/or concerns. PMT number previously provided.   Length of Stay: 2  Current Medications: Scheduled Meds:   cyanocobalamin  1,000 mcg Intramuscular Daily   diltiazem  180 mg Oral Daily   insulin aspart  0-9 Units Subcutaneous TID WC   pantoprazole (PROTONIX) IV  40 mg Intravenous Q24H    Continuous Infusions:  cefTAZidime (FORTAZ)  IV     heparin 800 Units/hr (06/10/22 1451)   potassium chloride 10 mEq (06/10/22 1447)   vancomycin 150 mL/hr at 06/09/22 1705    PRN Meds: acetaminophen **OR** acetaminophen, HYDROmorphone (DILAUDID) injection, metoprolol tartrate  Physical Exam Vitals and nursing note reviewed.  Constitutional:      General: He is not in acute distress.    Appearance: He is ill-appearing.  Pulmonary:     Effort: No respiratory distress.  Skin:    General: Skin is warm and dry.  Neurological:     Mental Status: He is alert and oriented to person, place, and  time.     Motor: Weakness present.  Psychiatric:        Attention and Perception: Attention normal.        Mood and Affect: Mood is anxious.        Behavior: Behavior is agitated. Behavior is cooperative.        Cognition and Memory: Memory normal. Cognition is impaired.             Vital Signs: BP (!) 198/84 (BP Location: Right Arm)   Pulse 100   Temp 98.2 F (36.8 C) (Oral)   Resp 19   Ht '5\' 10"'$  (1.778 m)   Wt 72.6 kg   SpO2 100%   BMI 22.96 kg/m  SpO2: SpO2: 100 % O2 Device: O2 Device: Nasal Cannula O2 Flow Rate: O2 Flow Rate (L/min): 2 L/min  Intake/output summary:  Intake/Output Summary (Last 24 hours) at 06/10/2022 1507 Last data filed at 06/10/2022 3825 Gross per 24 hour  Intake 517.85 ml  Output 900 ml  Net -382.15 ml   LBM:   Baseline Weight: Weight: 72.6 kg Most recent weight: Weight: 72.6 kg       Palliative Assessment/Data: PPS 20-30%      Patient Active Problem List   Diagnosis Date Noted   Acute discitis/osteomyelitis of thoracic/lumbar region of spine  05/39/7673   Acute metabolic encephalopathy 41/93/7902   Sepsis ([place]) 03/23/2017   AKI (acute kidney injury) ([place]) superimposed on CKD stage IIIb 03/23/2017   Leukocytosis    Cellulitis 03/22/2017   Diabetic foot ulcer associated with type 2 diabetes mellitus ([place]) 11/24/2014   Squamous cell carcinoma of scalp and skin of neck 05/29/2014   Unilateral AKA, right ([place]) 04/08/2014   PAD (peripheral artery disease) ([place]) 04/08/2014   Gangrene associated with type 2 diabetes mellitus ([place]) 04/02/2014   Generalized weakness 03/23/2014   Wound dehiscence 03/15/2014   Coagulopathy ([place]) 03/09/2014   Anemia due to acute blood loss 03/08/2014   Diabetes mellitus ([place]) 03/02/2014   Anemia 03/01/2014   Constipation 02/18/2014   Insomnia 02/18/2014   Amputation stump infection ([place]) 02/06/2014   Hx of right BKA ([place]) 01/28/2014   Osteomyelitis ([place]) 01/16/2014   Diabetic osteomyelitis ([place]) 01/09/2014    Pacemaker- [name] 02/20/2013   Hyperlipidemia 12/12/2012   TIA (transient ischemic  attack) 12/11/2012   ETOH abuse 04/02/2012   ACTINIC KERATOSIS 11/29/2010   MULTIPLE&UNSPEC OPEN WOUND LOWER LIMB COMP 10/19/2010   ADHESIVE CAPSULITIS OF SHOULDER 08/30/2010   ESOPHAGEAL STRICTURE 05/19/2010   DYSPHAGIA UNSPECIFIED 04/08/2010   DUPUYTREN'S CONTRACTURE, LEFT 12/21/2009   CERVICAL STRAIN, ACUTE 08/28/2009   OVERWEIGHT/OBESITY 04/07/2009   RBBB 04/07/2009   GERD 04/07/2009   SYNCOPE AND COLLAPSE 04/07/2009   FATIGUE / MALAISE 04/07/2009   PHLEBITIS&THROMBOPHLEB SUP VESSELS LOWER EXTREM 02/28/2008   CELLULITIS/ABSCESS, LEG 02/28/2008   BACK PAIN, CHRONIC 10/31/2007   Unspecified venous (peripheral) insufficiency 07/02/2007   GOUT 06/26/2007   Essential hypertension 06/26/2007   Atrial fibrillation ([place]) 06/26/2007   Chronic diastolic CHF (congestive heart failure) ([place]) 06/26/2007    Palliative Care Assessment & Plan   Patient Profile: 87 y.o. male  with past medical history of HTN, PAF on eliquis, B7SE, diastolic CHF, SSS s/p St. Jude PPM in 06/2011, GERD/esophgaeal stricture presented to ED on 06/08/22 from home with AMS. Patient was admitted on 06/08/2022 with acute discitis/osteomylitis of the thoracic/lumbar region of spine, acute encephalopathy, AKI on CKD 3b.    Assessment: Principal Problem:   Acute discitis/osteomyelitis of thoracic/lumbar region of spine  Active Problems:   Essential hypertension   Atrial fibrillation (H[facility])   Chronic diastolic CHF (congestive heart failure) (H[facility])   ESOPHAGEAL STRICTURE   GERD   Hyperlipidemia   Diabetes mellitus ([place])   AKI (acute kidney injury) ([place]) superimposed on CKD stage IIIb   Acute metabolic encephalopathy   SUMMARY OF RECOMMENDATIONS   Continue full scope treatment with watchful waiting Continue DNR/DNI as previously documented Family wish to try and gain additional information/insight into patient's current situation   Family will make stepwise decisions pending patient's clinical course/information received.  They are open to comfort/hospice care if no improvement/decline Ongoing goals of care pending clinical course PMT will continue to follow and support holistically  Goals of Care and Additional Recommendations: Limitations on Scope of Treatment: Full Scope Treatment  Code Status:    Code Status Orders  (From admission, onward)           Start     Ordered   06/09/22 1104  Do not attempt resuscitation (DNR)  Continuous       Question Answer Comment  In the event of cardiac or respiratory ARREST Do not call a "code blue"   In the event of cardiac or respiratory ARREST Do not perform Intubation, CPR, defibrillation or ACLS   In the event of cardiac or respiratory ARREST Use medication by any route, position, wound care, and other measures to relive pain and suffering. May use oxygen, suction and manual treatment of airway obstruction as needed for comfort.      06/09/22 1103           Code Status History     Date Active Date Inactive Code Status Order ID Comments User Context   06/08/2022 1447 06/09/2022 1103 Full Code [id number]  Orma Flaming, MD ED   03/23/2017 0117 03/25/2017 2219 Full Code [id number]  Karmen Bongo, MD Inpatient   04/02/2014 1516 04/05/2014 1811 Full Code [id number]  [name], MD Inpatient   03/18/2014 2112 03/20/2014 1937 Full Code [id number]  [name], MD Inpatient   03/15/2014 2233 03/18/2014 2112 Full Code [id number]  Berle Mull, MD ED   03/09/2014 0003 03/11/2014 1819 Full Code [id number]  Theressa Millard, MD Inpatient   03/08/2014 2356 03/09/2014 0003 DNR [id number]  Theressa Millard, MD  Inpatient   03/08/2014 2345 03/08/2014 2356 Full Code [id number]  Theressa Millard, MD ED   03/02/2014 0335 03/03/2014 1800 DNR [id number]  Berle Mull, MD Inpatient   03/01/2014 2353 03/02/2014 0335 Full Code [id number]  Berle Mull, MD Inpatient   01/28/2014 1440 03/01/2014  2353 DNR [id number] No cpr, defibrillation, intubation or mechanical ventilation [name], DO Outpatient   01/20/2014 1038 01/24/2014 1837 Full Code [id number]  [name], MD Inpatient   01/17/2014 1416 01/20/2014 1038 Full Code [id number]  Theodoro Kos, DO Inpatient   01/16/2014 2008 01/17/2014 1416 Full Code [id number]  Theodis Blaze, MD Inpatient   12/11/2012 2117 12/12/2012 1826 Full Code [id number]  Ernestine Conrad, RN Inpatient       Prognosis:  Unable to determine  Discharge Planning: To Be Determined  Care plan was discussed with primary RN, patient's wife  Thank you for allowing the Palliative Medicine Team to assist in the care of this patient.   Total Time 52 minutes Prolonged Time Billed  no       Greater than 50%  of this time was spent counseling and coordinating care related to the above assessment and plan.  Lin Landsman, NP  Please contact Palliative Medicine Team phone at [phone] for questions and concerns.   *Portions of this note are a verbal dictation therefore any spelling and/or grammatical errors are due to the "[employer]" system interpretation.

## 2022-06-10 NOTE — Progress Notes (Signed)
    [facility] for Infectious Disease   Reason for visit: Follow up on possible discitis/osteomyelitis  Interval History: blood cultures without any growth; WBC 11.  Remains afebrile.   Day 3 total antibiotics  Physical Exam: Constitutional:  Vitals:   06/10/22 0534 06/10/22 0725  BP: (!) 146/88 (!) 152/88  Pulse: (!) 105 (!) 104  Resp:  20  Temp:  99 F (37.2 C)  SpO2:  100%   Respiratory: Normal respiratory effort; CTA B Cardiovascular: RRR  Review of Systems: Unable to be assessed due to mental status  Lab Results  Component Value Date   WBC 11.0 (H) 06/10/2022   HGB 12.7 (L) 06/10/2022   HCT 39.3 06/10/2022   MCV 85.1 06/10/2022   PLT 309 06/10/2022    Lab Results  Component Value Date   CREATININE 1.70 (H) 06/09/2022   BUN 21 06/09/2022   NA 140 06/09/2022   K 3.2 (L) 06/09/2022   CL 106 06/09/2022   CO2 22 06/09/2022    Lab Results  Component Value Date   ALT 14 06/08/2022   AST 16 06/08/2022   ALKPHOS 103 06/08/2022     Microbiology: Recent Results (from the past 240 hour(s))  Urine Culture     Status: None   Collection Time: 06/08/22  9:13 AM   Specimen: Urine, Clean Catch  Result Value Ref Range Status   Specimen Description URINE, CLEAN CATCH  Final   Special Requests NONE  Final   Culture   Final    NO GROWTH Performed at [facility], [place] [address], [place], [place] [address]    Report Status 06/09/2022 FINAL  Final  Blood Culture (routine x 2)     Status: None (Preliminary result)   Collection Time: 06/08/22  9:28 AM   Specimen: BLOOD RIGHT FOREARM  Result Value Ref Range Status   Specimen Description BLOOD RIGHT FOREARM  Final   Special Requests   Final    BOTTLES DRAWN AEROBIC AND ANAEROBIC Blood Culture adequate volume   Culture   Final    NO GROWTH 2 DAYS Performed at [facility], [place] [address], [place], [place] [address]    Report Status PENDING  Incomplete  Blood Culture (routine x 2)     Status: None  (Preliminary result)   Collection Time: 06/08/22 10:08 AM   Specimen: BLOOD LEFT HAND  Result Value Ref Range Status   Specimen Description BLOOD LEFT HAND  Final   Special Requests   Final    BOTTLES DRAWN AEROBIC AND ANAEROBIC Blood Culture adequate volume   Culture   Final    NO GROWTH 2 DAYS Performed at [facility], [place] [address], [place], [place] [address]    Report Status PENDING  Incomplete    Impression/Plan:  Acute discitis/osteomyelitis of T11-12 and possible at L2-3 - He is on empiric antibiotics and no positive blood cultures.  He has significant pain but his inflammatory markers are not high, no leukocytosis and has had similar findings on the CT before.  ? If this is more related to compression fracture.  Will though continue with antibiotics as it is impossible to rule out infection.   Altered mental status - concern for antibiotic toxicity so stopped cefepime.  He continues to repeat words.  Neurology following. Medication monitoring - will monitor creat, platelets while on antibiotics.  Stable at this time.

## 2022-06-11 DIAGNOSIS — E089 Diabetes mellitus due to underlying condition without complications: Secondary | ICD-10-CM | POA: Diagnosis not present

## 2022-06-11 DIAGNOSIS — M4646 Discitis, unspecified, lumbar region: Secondary | ICD-10-CM | POA: Diagnosis not present

## 2022-06-11 DIAGNOSIS — R531 Weakness: Secondary | ICD-10-CM

## 2022-06-11 DIAGNOSIS — N179 Acute kidney failure, unspecified: Secondary | ICD-10-CM | POA: Diagnosis not present

## 2022-06-11 DIAGNOSIS — M869 Osteomyelitis, unspecified: Secondary | ICD-10-CM | POA: Diagnosis not present

## 2022-06-11 DIAGNOSIS — I4821 Permanent atrial fibrillation: Secondary | ICD-10-CM | POA: Diagnosis not present

## 2022-06-11 DIAGNOSIS — G9341 Metabolic encephalopathy: Secondary | ICD-10-CM | POA: Diagnosis not present

## 2022-06-11 LAB — CBC
HCT: 32.3 % — ABNORMAL LOW (ref 39.0–52.0)
Hemoglobin: 10.5 g/dL — ABNORMAL LOW (ref 13.0–17.0)
MCH: 27.3 pg (ref 26.0–34.0)
MCHC: 32.5 g/dL (ref 30.0–36.0)
MCV: 83.9 fL (ref 80.0–100.0)
Platelets: 212 10*3/uL (ref 150–400)
RBC: 3.85 MIL/uL — ABNORMAL LOW (ref 4.22–5.81)
RDW: 14.6 % (ref 11.5–15.5)
WBC: 7 10*3/uL (ref 4.0–10.5)
nRBC: 0 % (ref 0.0–0.2)

## 2022-06-11 LAB — BASIC METABOLIC PANEL
Anion gap: 9 (ref 5–15)
BUN: 25 mg/dL — ABNORMAL HIGH (ref 8–23)
CO2: 18 mmol/L — ABNORMAL LOW (ref 22–32)
Calcium: 8.6 mg/dL — ABNORMAL LOW (ref 8.9–10.3)
Chloride: 105 mmol/L (ref 98–111)
Creatinine, Ser: 1.82 mg/dL — ABNORMAL HIGH (ref 0.61–1.24)
GFR, Estimated: 36 mL/min — ABNORMAL LOW (ref >60)
Glucose, Bld: 112 mg/dL — ABNORMAL HIGH (ref 70–99)
Potassium: 3.7 mmol/L (ref 3.5–5.1)
Sodium: 132 mmol/L — ABNORMAL LOW (ref 135–145)

## 2022-06-11 LAB — APTT: aPTT: 30 seconds (ref 24–36)

## 2022-06-11 LAB — GLUCOSE, CAPILLARY
Glucose-Capillary: 111 mg/dL — ABNORMAL HIGH (ref 70–99)
Glucose-Capillary: 129 mg/dL — ABNORMAL HIGH (ref 70–99)
Glucose-Capillary: 151 mg/dL — ABNORMAL HIGH (ref 70–99)

## 2022-06-11 LAB — VANCOMYCIN, PEAK: Vancomycin Pk: 30 ug/mL (ref 30–40)

## 2022-06-11 LAB — HEPARIN LEVEL (UNFRACTIONATED): Heparin Unfractionated: 0.22 IU/mL — ABNORMAL LOW (ref 0.30–0.70)

## 2022-06-11 MED ORDER — OXYCODONE HCL 5 MG PO TABS
5.0000 mg | ORAL_TABLET | ORAL | Status: DC | PRN
  Administered 2022-06-11: 10 mg via ORAL
  Administered 2022-06-11: 5 mg via ORAL
  Administered 2022-06-11 – 2022-06-15 (×13): 10 mg via ORAL
  Filled 2022-06-11 (×15): qty 2

## 2022-06-11 MED ORDER — HYDROMORPHONE HCL 1 MG/ML IJ SOLN: 1.0000 mg | INTRAMUSCULAR | Status: DC | PRN

## 2022-06-11 MED ORDER — VANCOMYCIN HCL 500 MG/100ML IV SOLN
500.0000 mg | INTRAVENOUS | Status: DC
  Administered 2022-06-11 – 2022-06-12 (×2): 500 mg via INTRAVENOUS
  Filled 2022-06-11 (×2): qty 100

## 2022-06-11 NOTE — Evaluation (Signed)
Occupational Therapy Evaluation Patient Details Name: [name] MRN: [id number] DOB: October 15, 1935 Today's Date: 06/11/2022   History of Present Illness 87 year old man, PMH right AKA, wheelchair dependent, presented with sudden onset of confusion and worsening low back pain.  Work-up revealed acute discitis and osteomyelitis of the thoracic and lumbar spine.   Clinical Impression   Patient admitted for the diagnosis above.  PTA he lives with his wife in a one level apartment.  Patient uses a 16" manual wheelchair as his primary mode of mobility.  He was able to perform squat pivot transfers on his own, and completed ADL/iADL from wheelchair level.  Patient only needed assist for car transfers from apartment staff.  Currently back pain and generalized weakness are impacting independence.  Currently he is needing up to Max A for basic mobility, and lower body ADL from a seated position.  OT will follow in the acute setting, but he will need to transition to a SNF for short term rehab prior to returning home.  The patient does not have the needed 24 hour Max A to transition directly home.        Recommendations for follow up therapy are one component of a multi-disciplinary discharge planning process, led by the attending physician.  Recommendations may be updated based on patient status, additional functional criteria and insurance authorization.   Follow Up Recommendations  Skilled nursing-short term rehab (<3 hours/day)    Assistance Recommended at Discharge Frequent or constant Supervision/Assistance  Patient can return home with the following Help with stairs or ramp for entrance;Assist for transportation;Assistance with cooking/housework;Two people to help with walking and/or transfers;A lot of help with bathing/dressing/bathroom    Functional Status Assessment  Patient has had a recent decline in their functional status and demonstrates the ability to make significant improvements in  function in a reasonable and predictable amount of time.  Equipment Recommendations  None recommended by OT    Recommendations for Other Services  PT Eval     Precautions / Restrictions Precautions Precautions: Back Precaution Booklet Issued: No Precaution Comments: precautions verbally reviewed Required Braces or Orthoses: Spinal Brace Spinal Brace: Thoracolumbosacral orthotic;Applied in sitting position Restrictions Weight Bearing Restrictions: No      Mobility Bed Mobility Overal bed mobility: Needs Assistance Bed Mobility: Sidelying to Sit   Sidelying to sit: Mod assist, Max assist       General bed mobility comments: Initial L lean until he gets hes bearings    Transfers Overall transfer level: Needs assistance   Transfers: Sit to/from Stand, Bed to chair/wheelchair/BSC Sit to Stand: Max assist   Squat pivot transfers: Max assist       General transfer comment: Recommend STEDY for now      Balance Overall balance assessment: Needs assistance Sitting-balance support: Bilateral upper extremity supported, Feet supported Sitting balance-Leahy Scale: Poor     Standing balance support: Bilateral upper extremity supported Standing balance-Leahy Scale: Zero Standing balance comment: unable to achieve full stand                           ADL either performed or assessed with clinical judgement   ADL Overall ADL's : Needs assistance/impaired Eating/Feeding: Set up;Sitting   Grooming: Wash/dry hands;Wash/dry face;Set up;Sitting   Upper Body Bathing: Sitting;Minimal assistance   Lower Body Bathing: Moderate assistance;Sitting/lateral leans   Upper Body Dressing : Moderate assistance;Sitting   Lower Body Dressing: Moderate assistance;Sitting/lateral leans   Toilet Transfer: Maximal assistance;Squat-pivot;BSC/3in1  Toileting- Clothing Manipulation and Hygiene: Maximal assistance;Bed level               Vision Baseline Vision/History:  1 Wears glasses Patient Visual Report: No change from baseline       Perception Perception Perception: Within Functional Limits   Praxis Praxis Praxis: Intact    Pertinent Vitals/Pain Pain Assessment Faces Pain Scale: Hurts even more Pain Location: back Pain Descriptors / Indicators: Grimacing, Discomfort Pain Intervention(s): Monitored during session, RN gave pain meds during session     Hand Dominance Right   Extremity/Trunk Assessment Upper Extremity Assessment Upper Extremity Assessment: RUE deficits/detail;LUE deficits/detail RUE Deficits / Details: fair strength, decreased end range shoulder flexion.  R shoulder joint dysfunction. RUE Sensation: WNL RUE Coordination: WNL LUE Deficits / Details: fair strength, decreased end range shoulder flexion. LUE Sensation: WNL LUE Coordination: WNL   Lower Extremity Assessment Lower Extremity Assessment: Defer to PT evaluation   Cervical / Trunk Assessment Cervical / Trunk Assessment: Kyphotic   Communication Communication Communication: No difficulties   Cognition Arousal/Alertness: Awake/alert Behavior During Therapy: WFL for tasks assessed/performed Overall Cognitive Status: Impaired/Different from baseline Area of Impairment: Following commands, Problem solving                       Following Commands: Follows one step commands with increased time     Problem Solving: Slow processing, Difficulty sequencing       General Comments   VSS on RA    Exercises     Shoulder Instructions      Home Living Family/patient expects to be discharged to:: Private residence Living Arrangements: Spouse/significant other;Other (Comment) (7th floor apartment) Available Help at Discharge: Family;Available 24 hours/day Type of Home: Apartment Home Access: Level entry;Elevator     Home Layout: One level     Bathroom Shower/Tub: Occupational psychologist: Standard Bathroom Accessibility: Yes How  Accessible: Accessible via wheelchair Home Equipment: Wheelchair - Publishing copy (2 wheels);Hospital bed          Prior Functioning/Environment Prior Level of Function : Needs assist             Mobility Comments: Was able to SPT to/from wheelchair, and self propel w/c in his apartment.  Did need assist for in/out of the car via staff at apartment. ADLs Comments: Sponge bathes w/c level, cooks and cleans w/c level        OT Problem List: Decreased strength;Decreased activity tolerance;Impaired balance (sitting and/or standing);Pain      OT Treatment/Interventions: Self-care/ADL training;Therapeutic activities;Patient/family education;Balance training;DME and/or AE instruction    OT Goals(Current goals can be found in the care plan section) Acute Rehab OT Goals Patient Stated Goal: Go to rehab so I can transfer on my own again. OT Goal Formulation: With patient Time For Goal Achievement: 06/24/22 Potential to Achieve Goals: Good  OT Frequency: Min 2X/week    Co-evaluation              AM-PAC OT "6 Clicks" Daily Activity     Outcome Measure Help from another person eating meals?: None Help from another person taking care of personal grooming?: None Help from another person toileting, which includes using toliet, bedpan, or urinal?: A Lot Help from another person bathing (including washing, rinsing, drying)?: A Lot Help from another person to put on and taking off regular upper body clothing?: A Lot Help from another person to put on and taking off regular lower body clothing?: A Lot 6  Click Score: 16   End of Session Equipment Utilized During Treatment: Gait belt;Rolling walker (2 wheels) Nurse Communication: Mobility status;Need for lift equipment  Activity Tolerance: Patient tolerated treatment well Patient left: in chair;with call bell/phone within reach;with bed alarm set;with family/visitor present  OT Visit Diagnosis: Unsteadiness on feet  (R26.81);Muscle weakness (generalized) (M62.81)                Time: 8979-1504 OT Time Calculation (min): 22 min Charges:  OT General Charges $OT Visit: 1 Visit OT Evaluation $OT Eval Moderate Complexity: 1 Mod  06/11/2022  RP, OTR/L  Acute Rehabilitation Services  Office:  [phone]   Metta Clines 06/11/2022, 4:52 PM

## 2022-06-11 NOTE — Progress Notes (Signed)
Orthopedic Tech Progress Note Patient Details:  [name] 1934-12-07 [id number]  Ortho Devices Type of Ortho Device: Thoracolumbar corset (TLSO) Ortho Device/Splint Location: BACK Ortho Device/Splint Interventions: Ordered      Edwina Barth 06/11/2022, 2:56 AM

## 2022-06-11 NOTE — Progress Notes (Signed)
Pharmacy Antibiotic Note  [name] is a 87 y.o. male admitted on 06/08/2022 with  osteomyelitis .  Pharmacy has been consulted for vancomycin dosing. Renal function has declined. Decreasing vancomycin dose and will order levels for tonight and Sunday for any changes to dosing on Monday.  Plan: Decrease vancomycin 500 mg IV q24h (Scr 1.82, TBW < IBW, AUC 434.7) F/u VP 7/22 '@1800'$ , VT 7/23 '@1500'$  Continue Merrem 1 gram IV q12h (CrCl 25.9) F/u renal function, s/sx of improvement, and vanc lvls at SS  Height: '5\' 10"'$  (177.8 cm) Weight: 61.7 kg (136 lb 0.4 oz) IBW/kg (Calculated) : 73  Temp (24hrs), Avg:98.6 F (37 C), Min:98 F (36.7 C), Max:99.4 F (37.4 C)  Recent Labs  Lab 06/08/22 0928 06/08/22 1124 06/09/22 0359 06/10/22 0529 06/11/22 0618  WBC 8.6  --  10.3 11.0* 7.0  CREATININE 1.74*  --  1.70*  --  1.82*  LATICACIDVEN 1.3 1.5  --   --   --      Estimated Creatinine Clearance: 25.9 mL/min (A) (by C-G formula based on SCr of 1.82 mg/dL (H)).    Allergies  Allergen Reactions   Morphine Rash and Other (See Comments)    Irritability also   Statins Other (See Comments)    dizzy    Antimicrobials this admission: 7/19 cefepime >> 7/21 7/19 Zosyn >> X1 dose only 7/19 Vanc >> 7/21 Merrem >>  Dose adjustments this admission: Merrem dosed for CrCl 25.9 ml/min (1 gram iv q12h upper end of dosing range)  Microbiology results: 7/19 BCx: NGTD 7/19 UCx: NGTD  Thank you for allowing pharmacy to be a part of this patient's care.  Varney Daily, PharmD PGY2 Pharmacy Resident  Please check AMION for all [facility] pharmacy phone numbers After 10:00 PM call main pharmacy [phone]

## 2022-06-11 NOTE — Progress Notes (Signed)
[facility] for Infectious Disease   Reason for visit: Follow up on possible discitis/osteomyelitis  Interval History: alert, WBC 7.0, afebrile.  Feels better, less back pain.  Daughter at bedside Day 4 total antibiotics  Physical Exam: Constitutional:  Vitals:   06/11/22 0943 06/11/22 1020  BP: (!) 188/78 (!) 156/66  Pulse:    Resp:  14  Temp:    SpO2:     patient appears in NAD Respiratory: Normal respiratory effort; CTA B Cardiovascular: RRR Neuro: alert, responding appropriately   Review of Systems: Constitutional: negative for fevers and chills  Lab Results  Component Value Date   WBC 7.0 06/11/2022   HGB 10.5 (L) 06/11/2022   HCT 32.3 (L) 06/11/2022   MCV 83.9 06/11/2022   PLT 212 06/11/2022    Lab Results  Component Value Date   CREATININE 1.82 (H) 06/11/2022   BUN 25 (H) 06/11/2022   NA 132 (L) 06/11/2022   K 3.7 06/11/2022   CL 105 06/11/2022   CO2 18 (L) 06/11/2022    Lab Results  Component Value Date   ALT 14 06/08/2022   AST 16 06/08/2022   ALKPHOS 103 06/08/2022     Microbiology: Recent Results (from the past 240 hour(s))  Urine Culture     Status: None   Collection Time: 06/08/22  9:13 AM   Specimen: Urine, Clean Catch  Result Value Ref Range Status   Specimen Description URINE, CLEAN CATCH  Final   Special Requests NONE  Final   Culture   Final    NO GROWTH Performed at [facility], [place] [address] [place], [place] [address]    Report Status 06/09/2022 FINAL  Final  Blood Culture (routine x 2)     Status: None (Preliminary result)   Collection Time: 06/08/22  9:28 AM   Specimen: BLOOD RIGHT FOREARM  Result Value Ref Range Status   Specimen Description BLOOD RIGHT FOREARM  Final   Special Requests   Final    BOTTLES DRAWN AEROBIC AND ANAEROBIC Blood Culture adequate volume   Culture   Final    NO GROWTH 3 DAYS Performed at [facility], [name] [address] [place], [place] [address]    Report Status PENDING   Incomplete  Blood Culture (routine x 2)     Status: None (Preliminary result)   Collection Time: 06/08/22 10:08 AM   Specimen: BLOOD LEFT HAND  Result Value Ref Range Status   Specimen Description BLOOD LEFT HAND  Final   Special Requests   Final    BOTTLES DRAWN AEROBIC AND ANAEROBIC Blood Culture adequate volume   Culture   Final    NO GROWTH 3 DAYS Performed at [facility], [place] [address], [place], [place] [address]    Report Status PENDING  Incomplete    Impression/Plan:  1. Possible discitis/osteomyelitis - his findings on the CT scan are concerning for osteomyelitis but largely non-specific for infection.  He has a known back history with surgery and CT findings previously show similar issues in the same area. The inflammatory markers are not significant.  Regardless though, I think it is prudent to continue with treatment for osteomyelitis/discitis and recommend a 6 week course of treatment with vancomycin and will use ceftriaxone in place of ceftazidime.   The patient and daughter are in agreement, particularly in light of his improvement in pain and mental status.    2.  Altered mental status - he seems much improved today,  not repeating words, appropriate responses.  It seems to be correlated with the improvement in his pain, though unclear.  Will continue to monitor.   3.  Access - his creat is elevated at baseline.  I suspect he is ultimately not a good dialysis candidate in the future so a picc line likely will be appropriate, vs a tunneled catheter instead if there is some consideration to preserving his sites.    4.  Disposition - his daughter expressed that she feels he would be best served at a SNF or rehab for the antibiotics.

## 2022-06-11 NOTE — Progress Notes (Signed)
Progress Note   Patient: [name] XBD:[id number] DOB: July 17, 1935 DOA: 06/08/2022     3 DOS: the patient was seen and examined on 06/11/2022   Brief hospital course: 87 year old man lives at home with his wife, PMH right AKA, wheelchair dependent, mentally sharp, presented with sudden onset of confusion and worsening low back pain.  Work-up revealed acute discitis and osteomyelitis of the thoracic and lumbar spine.  On empiric antibiotics, neurosurgery, neurology, infectious disease and palliative medicine involved in this complex case.  Assessment and Plan: * Acute discitis/osteomyelitis of thoracic/lumbar region of spine  -- cannot get MRI secondary to pacemaker.  CT showed acute discitis/osteomyelitis at T11-12 with significant endplate destruction and retropulsion of bony fragment into ventral spinal canal. Mild discitis/OM suspected at L2-3 level.  However he has a history of back abnormalities on imaging and infection is somewhat less than definite. --will treat 6 weeks with antibiotics per ID --TLSO and can mobilize.  He is not a candidate for any intervention at this point.  However if he substantially improves, he can follow-up with Dr. Christella Noa for consideration of surgical intervention. --Palliative care also involved.  Acute metabolic encephalopathy -- Bizarre affect on admission, now resolved.  Discussed with neurology, etiology unclear, possibly related to pain.  Spot EEG unremarkable, long-term video EEG unremarkable. --Signs or symptoms of infection other than in the back. -- CT head no acute abnormalities.  MRI not obtainable secondary to pacemaker.   AKI (acute kidney injury) ([place]) superimposed on CKD stage IIIb --Baseline creatinine appears around 1.4-1.5 --appears stable now  Chronic diastolic CHF (congestive heart failure) (HCC) --LVEF 83-41%, diastolic function could not be determined.  --no obvious valve abnormalities  Atrial fibrillation ([place]) --CHA2DS2-VASC  7; can resume apixaban   --Rate control with metoprolol. .   Diabetes mellitus (HCC) --A1C 6.4 --CBG stable. Continue SSI. Has not needed long-acting insulin at home.  Essential hypertension --monitor  GERD --Continue PPI   ESOPHAGEAL STRICTURE --History of esophageal stricture and dysphagia. Can't exclude distal esophageal stricture on CT; consider outpatient GI evaluation.           Subjective:  Feels better Hungry Has back pain  Physical Exam: Vitals:   06/11/22 0500 06/11/22 0744 06/11/22 0943 06/11/22 1020  BP:  (!) 175/76 (!) 188/78 (!) 156/66  Pulse:  80    Resp:  19  14  Temp:  98.5 F (36.9 C)    TempSrc:  Oral    SpO2:  100%    Weight: 61.7 kg     Height:       Physical Exam Vitals reviewed.  Constitutional:      General: He is not in acute distress.    Appearance: He is not ill-appearing or toxic-appearing.  Cardiovascular:     Rate and Rhythm: Regular rhythm.     Heart sounds: No murmur heard.    Comments: SR Pulmonary:     Effort: Pulmonary effort is normal. No respiratory distress.     Breath sounds: No wheezing, rhonchi or rales.  Musculoskeletal:     Left lower leg: No edema.     Comments: Moves LLE  Neurological:     General: No focal deficit present.     Mental Status: He is alert and oriented to person, place, and time.     Comments: "It's my birthday, I am 9". Oriented to hospital, month, year, self  Psychiatric:        Mood and Affect: Mood normal.  Behavior: Behavior normal.     Data Reviewed:  Creatinine 1.82, stable Na+ 132 Hgb down to 10.5, probably dilution  Family Communication: called wife, no answer  Disposition: Status is: Inpatient Remains inpatient appropriate because: back infection  Planned Discharge Destination:  TBD    Time spent: 35 minutes  Author: Murray Hodgkins, MD 06/11/2022 4:31 PM  For on call review [email or web address].

## 2022-06-11 NOTE — Progress Notes (Signed)
LTM EEG discontinued - no skin breakdown at unhook.   

## 2022-06-11 NOTE — Progress Notes (Signed)
Daily Progress Note   Patient Name: [name]       Date: 06/11/2022 DOB: 01/11/1935  Age: 87 y.o. MRN#: [id number] Attending Physician: Samuella Cota, MD Primary Care Physician: Seward Carol, MD Admit Date: 06/08/2022  Reason for Consultation/Follow-up: Establishing goals of care  Subjective: Chart review performed.  Received report from primary RN - no acute concerns.  EEG will be discontinued today - so far results are unremarkable.  Went to visit patient at bedside - no family/visitors present.  [name], alert, oriented, and able to participate in conversation.  He is more oriented to situation today compared to yesterday.  No signs or non-verbal gestures of pain or discomfort noted. No respiratory distress, increased work of breathing, or secretions noted.  Patient does endorse pain in his back - RN present and administering pain medication.  Patient requests his glasses - will notify his wife.  Patient also tells me he is hungry and ready for breakfast - meal tray at bedside; staff or family to assist with feeding.  Patient states his daughter will be arriving soon and can help him with breakfast.   Called patient's wife/Ann - emotional support provided.  Reviewed my and RNs assessment as of today.  Wife expresses joy that patient seems to be slowly improving.  Reviewed updates to include: recommendation for TLSO brace and mobilization, Neurosurgery does not feel he is a candidate for any interventions at this point (wife agrees); however, if substantial improvements they can follow-up in the office for consideration of surgical intervention.   Reviewed that PT/OT will be working with and assessing patient and will make recommendations based off their  evaluation -reviewed anticipated options to include rehab versus home health, but likely rehab.  Wife feels that patient needs to be much more independent prior to him coming home as she is not physically able to care for him ( needs to be able to transfer, complete some ADLs).  Provided updates the EEG so far has been unremarkable and will be discontinued today.  Reviewed that patient is still with concern for aspiration risk and that assessment will be ongoing as it is uncertain if this is due to his esophageal stricture history versus AMS.  Notified her that patient is requesting his glasses.  Wife expresses gratitude for phone  call and updates today.  All questions and concerns addressed. Encouraged to call with questions and/or concerns. PMT card previously provided.  Length of Stay: 3  Current Medications: Scheduled Meds:   cyanocobalamin  1,000 mcg Intramuscular Daily   diltiazem  180 mg Oral Daily   heparin injection (subcutaneous)  5,000 Units Subcutaneous Q8H   insulin aspart  0-9 Units Subcutaneous TID WC   pantoprazole (PROTONIX) IV  40 mg Intravenous Q24H    Continuous Infusions:  cefTAZidime (FORTAZ)  IV 2 g (06/11/22 0953)   vancomycin      PRN Meds: acetaminophen **OR** acetaminophen, HYDROmorphone (DILAUDID) injection, metoprolol tartrate  Physical Exam Vitals and nursing note reviewed.  Constitutional:      General: He is not in acute distress.    Appearance: He is ill-appearing.  Pulmonary:     Effort: No respiratory distress.  Skin:    General: Skin is warm and dry.  Neurological:     Mental Status: He is alert and oriented to person, place, and time.     Motor: Weakness present.  Psychiatric:        Attention and Perception: Attention normal.        Behavior: Behavior is hyperactive. Behavior is cooperative.        Cognition and Memory: Cognition and memory normal.             Vital Signs: BP (!) 188/78   Pulse 80   Temp 98.5 F (36.9 C) (Oral)    Resp 19   Ht '5\' 10"'$  (1.778 m)   Wt 61.7 kg   SpO2 100%   BMI 19.52 kg/m  SpO2: SpO2: 100 % O2 Device: O2 Device: Room Air O2 Flow Rate: O2 Flow Rate (L/min): 2 L/min  Intake/output summary:  Intake/Output Summary (Last 24 hours) at 06/11/2022 0959 Last data filed at 06/11/2022 4967 Gross per 24 hour  Intake 1386.23 ml  Output 400 ml  Net 986.23 ml   LBM:   Baseline Weight: Weight: 72.6 kg Most recent weight: Weight: 61.7 kg       Palliative Assessment/Data: PPS 30%      Patient Active Problem List   Diagnosis Date Noted   Acute discitis/osteomyelitis of thoracic/lumbar region of spine  59/16/3846   Acute metabolic encephalopathy 65/99/3570   Sepsis ([place]) 03/23/2017   AKI (acute kidney injury) ([place]) superimposed on CKD stage IIIb 03/23/2017   Leukocytosis    Cellulitis 03/22/2017   Diabetic foot ulcer associated with type 2 diabetes mellitus ([place]) 11/24/2014   Squamous cell carcinoma of scalp and skin of neck 05/29/2014   Unilateral AKA, right ([place]) 04/08/2014   PAD (peripheral artery disease) ([place]) 04/08/2014   Gangrene associated with type 2 diabetes mellitus ([place]) 04/02/2014   Generalized weakness 03/23/2014   Wound dehiscence 03/15/2014   Coagulopathy ([place]) 03/09/2014   Anemia due to acute blood loss 03/08/2014   Diabetes mellitus ([place]) 03/02/2014   Anemia 03/01/2014   Constipation 02/18/2014   Insomnia 02/18/2014   Amputation stump infection ([place]) 02/06/2014   Hx of right BKA ([place]) 01/28/2014   Osteomyelitis ([place]) 01/16/2014   Diabetic osteomyelitis ([place]) 01/09/2014   Pacemaker- [name] 02/20/2013   Hyperlipidemia 12/12/2012   TIA (transient ischemic attack) 12/11/2012   ETOH abuse 04/02/2012   ACTINIC KERATOSIS 11/29/2010   MULTIPLE&UNSPEC OPEN WOUND LOWER LIMB COMP 10/19/2010   ADHESIVE CAPSULITIS OF SHOULDER 08/30/2010   ESOPHAGEAL STRICTURE 05/19/2010   DYSPHAGIA UNSPECIFIED 04/08/2010   DUPUYTREN'S CONTRACTURE, LEFT 12/21/2009   CERVICAL STRAIN,  ACUTE 08/28/2009   OVERWEIGHT/OBESITY 04/07/2009   RBBB 04/07/2009   GERD 04/07/2009   SYNCOPE AND COLLAPSE 04/07/2009   FATIGUE / MALAISE 04/07/2009   PHLEBITIS&THROMBOPHLEB SUP VESSELS LOWER EXTREM 02/28/2008   CELLULITIS/ABSCESS, LEG 02/28/2008   BACK PAIN, CHRONIC 10/31/2007   Unspecified venous (peripheral) insufficiency 07/02/2007   GOUT 06/26/2007   Essential hypertension 06/26/2007   Atrial fibrillation ([place]) 06/26/2007   Chronic diastolic CHF (congestive heart failure) ([place]) 06/26/2007    Palliative Care Assessment & Plan   Patient Profile: 87 y.o. male  with past medical history of HTN, PAF on eliquis, H0WC, diastolic CHF, SSS s/p St[employer] Jude PPM in 06/2011, GERD/esophgaeal stricture presented to ED on 06/08/22 from home with AMS. Patient was admitted on 06/08/2022 with acute discitis/osteomylitis of the thoracic/lumbar region of spine, acute encephalopathy, AKI on CKD 3b.    Assessment: Principal Problem:   Acute discitis/osteomyelitis of thoracic/lumbar region of spine  Active Problems:   Essential hypertension   Atrial fibrillation (HCC)   Chronic diastolic CHF (congestive heart failure) (HCC)   ESOPHAGEAL STRICTURE   GERD   Hyperlipidemia   Diabetes mellitus ([place])   AKI (acute kidney injury) ([place]) superimposed on CKD stage IIIb   Acute metabolic encephalopathy   SUMMARY OF RECOMMENDATIONS   Continue full scope treatment with watchful waiting Continue DNR/DNI as previously documented Family wish to try and gain additional information/insight into patient's current situation  Family will make stepwise decisions pending patient's clinical course/information received.  They are open to comfort/hospice care if no improvement/decline Ongoing goals of care pending clinical course PMT will continue to follow and support holistically  Goals of Care and Additional Recommendations: Limitations on Scope of Treatment: Full Scope Treatment and No Tracheostomy  Code  Status:    Code Status Orders  (From admission, onward)           Start     Ordered   06/09/22 1104  Do not attempt resuscitation (DNR)  Continuous       Question Answer Comment  In the event of cardiac or respiratory ARREST Do not call a "code blue"   In the event of cardiac or respiratory ARREST Do not perform Intubation, CPR, defibrillation or ACLS   In the event of cardiac or respiratory ARREST Use medication by any route, position, wound care, and other measures to relive pain and suffering. May use oxygen, suction and manual treatment of airway obstruction as needed for comfort.      06/09/22 1103           Code Status History     Date Active Date Inactive Code Status Order ID Comments User Context   06/08/2022 1447 06/09/2022 1103 Full Code [id number]  Orma Flaming, MD ED   03/23/2017 0117 03/25/2017 2219 Full Code [id number]  Karmen Bongo, MD Inpatient   04/02/2014 1516 04/05/2014 1811 Full Code [id number]  [name], MD Inpatient   03/18/2014 2112 03/20/2014 1937 Full Code [id number]  [name], MD Inpatient   03/15/2014 2233 03/18/2014 2112 Full Code [id number]  Berle Mull, MD ED   03/09/2014 0003 03/11/2014 1819 Full Code [id number]  Theressa Millard, MD Inpatient   03/08/2014 2356 03/09/2014 0003 DNR [id number]  Theressa Millard, MD Inpatient   03/08/2014 2345 03/08/2014 2356 Full Code [id number]  Theressa Millard, MD ED   03/02/2014 0335 03/03/2014 1800 DNR [id number]  Berle Mull, MD Inpatient   03/01/2014 2353 03/02/2014 0335 Full Code [id number]  Berle Mull, MD Inpatient  01/28/2014 1440 03/01/2014 2353 DNR [id number] No cpr, defibrillation, intubation or mechanical ventilation [name], DO Outpatient   01/20/2014 1038 01/24/2014 1837 Full Code [id number]  [name], MD Inpatient   01/17/2014 1416 01/20/2014 1038 Full Code [id number]  Theodoro Kos, DO Inpatient   01/16/2014 2008 01/17/2014 1416 Full Code [id number]  Theodis Blaze, MD Inpatient   12/11/2012  2117 12/12/2012 1826 Full Code [id number]  Ernestine Conrad, RN Inpatient       Prognosis:  Unable to determine  Discharge Planning: To Be Determined  Care plan was discussed with primary RN, patient's wife, patient  Thank you for allowing the Palliative Medicine Team to assist in the care of this patient.   Total Time 50 minutes Prolonged Time Billed  no       Greater than 50%  of this time was spent counseling and coordinating care related to the above assessment and plan.  Lin Landsman, NP  Please contact Palliative Medicine Team phone at [phone] for questions and concerns.   *Portions of this note are a verbal dictation therefore any spelling and/or grammatical errors are due to the "[place] [employer]" system interpretation.

## 2022-06-11 NOTE — Progress Notes (Signed)
Neurology Progress Note  Brief HPI: Patient with hx HTN, a-fib on Eliquis, right AKA, DM, CHF, CKD, spinal surgery,  sick sinus syndrome s/p pacer presented to the ED with AMS.  At baseline, he is alert and oriented with occasional problems with recall of remote events but no confusion.  He reportedly went out to his car to get groceries and was found by his wife with AMS, perseverating on different words and unable to get out of his wheelchair.  He was noted to be hypoxic by EMS. Patient did c/o back pain and there was concern for thoracic/lumbar osteomyelitis and discitis.  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    Infectious disease notes that his inflammatory markers are not significantly elevated, he has no fever or leukocytosis and it is unclear whether he has infection currently versus pain from a compression fracture    Subjective: Patient is alert conversant today.  He is hoping that his family will bring him his glasses from home.  He states that he feels much better but still has some problems with his memory.  Exam: Vitals:   06/11/22 0943 06/11/22 1020  BP: (!) 188/78 (!) 156/66  Pulse:    Resp:  14  Temp:    SpO2:     Gen: In bed, NAD Resp: non-labored breathing, no acute distress  Neuro: Mental Status: alert and oriented to person, place and time.  Correctly states that today is his birthday.  3/3 registration, 3/3 recall, able to name 8 animals when asked Cranial Nerves:PERRL, EOMI, facial sensation and movement symmetrical, hearing intact to voice, phonation normal, tongue midline Motor: 5/5 strength in BUE and 3/5 strength in BLE Sensory:sensation intact and symmetrical to all four extremities Coordination: FNF intact bilaterally Gait: Deferred  Pertinent Labs: CBC    Component Value Date/Time   WBC 7.0 06/11/2022 0618   RBC 3.85 (L) 06/11/2022 0618   HGB 10.5 (L) 06/11/2022  0618   HGB 10.7 (L) 04/28/2021 1403   HCT 32.3 (L) 06/11/2022 0618   HCT 32.5 (L) 04/28/2021 1403   PLT 212 06/11/2022 0618   PLT 211 04/28/2021 1403   MCV 83.9 06/11/2022 0618   MCV 83 04/28/2021 1403   MCH 27.3 06/11/2022 0618   MCHC 32.5 06/11/2022 0618   RDW 14.6 06/11/2022 0618   RDW 13.6 04/28/2021 1403   LYMPHSABS 1.2 06/08/2022 0928   LYMPHSABS 1.2 08/21/2020 1344   MONOABS 0.5 06/08/2022 0928   EOSABS 0.0 06/08/2022 0928   EOSABS 0.2 08/21/2020 1344   BASOSABS 0.0 06/08/2022 0928   BASOSABS 0.1 08/21/2020 1344       Latest Ref Rng & Units 06/11/2022    6:18 AM 06/09/2022    3:59 AM 06/08/2022   10:22 AM  BMP  Glucose 70 - 99 mg/dL 112  151    BUN 8 - 23 mg/dL 25  21    Creatinine 0.61 - 1.24 mg/dL 1.82  1.70    Sodium 135 - 145 mmol/L 132  140  139   Potassium 3.5 - 5.1 mmol/L 3.7  3.2  3.5   Chloride 98 - 111 mmol/L 105  106    CO2 22 - 32 mmol/L 18  22    Calcium 8.9 - 10.3 mg/dL 8.6  9.2     Folate 32.4  EEG findings are consistent with generalized periodic discharges consistent with toxic metabolic causes neurotoxicity and show no definite seizure  activity.   Imaging Reviewed: CT Head:  No acute abnormality  Assessment: Patient with history of HTN, a-fib on Eliquis, right AKA, DM, CHF, CKD, sick sinus syndrome s/p pacer presented with altered mental status.  On presentation, he was quite altered and perseverating on different words and phrases and complaining of severe back pain.  Patient is normally alert, oriented and active at baseline.  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  AMS is likely due to delirium  from acute pain, possible infection, hospitalization, compounded by cefepime neurotoxicity.  Overnight EEG shows no seizure activity.  Fortunately his mental status is improving which could be secondary to treatment of his pain / possible infection versus clearing of an postictal state. Cognitive exam is improved today, but patient feels that he is not yet back to his baseline.  Impression: Delirium likely due to infection and cefepime neurotoxicity with c/f seizure activity causing AMS  Recommendations: 1) Defer treatment of possible discitis/osteomyelitis to ID recommendations, appreciate discontinuation of cefepime 2) appreciate palliative care consult following and management of comorbidities by primary team 3) balance pain control with judicious use of pain medication given both can be deliriogenic 4) Discontinue continuous EEG as no seizure activity seen  [place] , MSN, AGACNP-BC [facility] See Amion for schedule and pager information 06/11/2022 10:21 AM   Attending Neurologist's note:  I personally saw this patient, gathering history, performing a neurologic examination, reviewing relevant labs, personally reviewing relevant imaging including Head CT, and formulated the assessment and plan, adding the note above for completeness and clarity to accurately reflect my thoughts  Greater than 35 minutes were spent in care of this patient today, greater than 50% at bedside, independent of time spent by NP

## 2022-06-11 NOTE — Procedures (Signed)
Patient Name: QUAY SIMKIN  MRN: [id number]  Epilepsy Attending: Lora Havens  Referring Physician/Provider: Samuella Cota, MD  Duration: 06/10/2022 1202 to 06/11/2022 1004   Patient history: 87 year old male with altered mental status.  EEG to evaluate for seizure.   Level of alertness: Awake, asleep   AEDs during EEG study: None   Technical aspects: This EEG study was done with scalp electrodes positioned according to the 10-20 International system of electrode placement. Electrical activity was acquired at a sampling rate of '500Hz'$  and reviewed with a high frequency filter of '70Hz'$  and a low frequency filter of '1Hz'$ . EEG data were recorded continuously and digitally stored.    Description: The posterior dominant rhythm consists of 8-9 Hz activity of moderate voltage (25-35 uV) seen predominantly in posterior head regions, symmetric and reactive to eye opening and eye closing. Sleep was characterized by vertex waves, sleep spindles (12 to 14 Hz), maximal frontocentral region. EEG showed intermittent generalized sharply contoured 3 to 6 Hz theta-delta slowing. Hyperventilation and photic stimulation were not performed.     ABNORMALITY - Intermittent slow, generalized   IMPRESSION: This study is suggestive of mild diffuse encephalopathy, nonspecific etiology. No seizures or definite epileptiform discharges were seen during this study.    Maicy Filip Barbra Sarks

## 2022-06-12 DIAGNOSIS — G9341 Metabolic encephalopathy: Secondary | ICD-10-CM | POA: Diagnosis not present

## 2022-06-12 DIAGNOSIS — N179 Acute kidney failure, unspecified: Secondary | ICD-10-CM | POA: Diagnosis not present

## 2022-06-12 DIAGNOSIS — L8990 Pressure ulcer of unspecified site, unspecified stage: Secondary | ICD-10-CM | POA: Insufficient documentation

## 2022-06-12 DIAGNOSIS — E089 Diabetes mellitus due to underlying condition without complications: Secondary | ICD-10-CM | POA: Diagnosis not present

## 2022-06-12 LAB — GLUCOSE, CAPILLARY
Glucose-Capillary: 257 mg/dL — ABNORMAL HIGH (ref 70–99)
Glucose-Capillary: 136 mg/dL — ABNORMAL HIGH (ref 70–99)
Glucose-Capillary: 171 mg/dL — ABNORMAL HIGH (ref 70–99)
Glucose-Capillary: 180 mg/dL — ABNORMAL HIGH (ref 70–99)
Glucose-Capillary: 179 mg/dL — ABNORMAL HIGH (ref 70–99)

## 2022-06-12 LAB — CBC
HCT: 33.2 % — ABNORMAL LOW (ref 39.0–52.0)
Hemoglobin: 11 g/dL — ABNORMAL LOW (ref 13.0–17.0)
MCH: 27.3 pg (ref 26.0–34.0)
MCHC: 33.1 g/dL (ref 30.0–36.0)
MCV: 82.4 fL (ref 80.0–100.0)
Platelets: 210 10*3/uL (ref 150–400)
RBC: 4.03 MIL/uL — ABNORMAL LOW (ref 4.22–5.81)
RDW: 14.4 % (ref 11.5–15.5)
WBC: 6.6 10*3/uL (ref 4.0–10.5)
nRBC: 0 % (ref 0.0–0.2)

## 2022-06-12 LAB — VANCOMYCIN, TROUGH: Vancomycin Tr: 20 ug/mL (ref 15–20)

## 2022-06-12 MED ORDER — APIXABAN 2.5 MG PO TABS
2.5000 mg | ORAL_TABLET | Freq: Two times a day (BID) | ORAL | Status: DC
  Administered 2022-06-12 – 2022-06-15 (×6): 2.5 mg via ORAL
  Filled 2022-06-12 (×6): qty 1

## 2022-06-12 MED ORDER — APIXABAN 5 MG PO TABS: 5.0000 mg | ORAL_TABLET | Freq: Two times a day (BID) | ORAL | Status: DC

## 2022-06-12 MED ORDER — SODIUM CHLORIDE 0.9 % IV SOLN
2.0000 g | INTRAVENOUS | Status: DC
  Administered 2022-06-13: 2 g via INTRAVENOUS
  Filled 2022-06-12: qty 2

## 2022-06-12 MED ORDER — POLYETHYLENE GLYCOL 3350 17 G PO PACK
17.0000 g | PACK | Freq: Every day | ORAL | Status: DC
  Administered 2022-06-12 – 2022-06-14 (×3): 17 g via ORAL
  Filled 2022-06-12 (×3): qty 1

## 2022-06-12 MED ORDER — PANTOPRAZOLE SODIUM 40 MG PO TBEC
40.0000 mg | DELAYED_RELEASE_TABLET | Freq: Every day | ORAL | Status: DC
  Administered 2022-06-12 – 2022-06-15 (×4): 40 mg via ORAL
  Filled 2022-06-12 (×4): qty 1

## 2022-06-12 MED ORDER — BACLOFEN 10 MG PO TABS
5.0000 mg | ORAL_TABLET | Freq: Three times a day (TID) | ORAL | Status: DC
  Administered 2022-06-12 – 2022-06-15 (×8): 5 mg via ORAL
  Filled 2022-06-12 (×8): qty 1

## 2022-06-12 MED ORDER — VANCOMYCIN HCL 750 MG/150ML IV SOLN
750.0000 mg | INTRAVENOUS | Status: DC
Start: 2022-06-14 — End: 2022-06-13

## 2022-06-12 MED ORDER — ORAL CARE MOUTH RINSE: 15.0000 mL | OROMUCOSAL | Status: DC | PRN

## 2022-06-12 MED ORDER — HYDROCERIN EX CREA
TOPICAL_CREAM | Freq: Two times a day (BID) | CUTANEOUS | Status: DC
  Filled 2022-06-12: qty 113

## 2022-06-12 NOTE — Progress Notes (Signed)
Progress Note   Patient: [name] VOZ:[id number] DOB: [birth year] DOA: 06/08/2022     4 DOS: the patient was seen and examined on 06/12/2022   Brief hospital course: 87 year old man lives at home with his wife, PMH right AKA, wheelchair dependent, mentally sharp, presented with sudden onset of confusion and worsening low back pain.  Work-up revealed acute discitis and osteomyelitis of the thoracic and lumbar spine.  On empiric antibiotics, neurosurgery, neurology, infectious disease and palliative medicine involved in this complex case.  Assessment and Plan: * Acute discitis/osteomyelitis of thoracic/lumbar region of spine  -- cannot get MRI secondary to pacemaker.  CT showed acute discitis/osteomyelitis at T11-12 with significant endplate destruction and retropulsion of bony fragment into ventral spinal canal. Mild discitis/OM suspected at L2-3 level.  However he has a history of back abnormalities on imaging and infection is somewhat less than definite. --will treat 6 weeks with vanc and ceftriaxone (on discharge) per ID; place PICC tomorrow. --TLSO and can mobilize.  He is not a candidate for any intervention at this point.  However if he substantially improves, he can follow-up with Dr. Christella Noa for consideration of surgical intervention. --Palliative care also involved.  Acute metabolic encephalopathy -- Bizarre affect on admission, now resolved.  Discussed with neurology, etiology unclear, possibly related to pain.  Spot EEG unremarkable, long-term video EEG unremarkable. -- No signs or symptoms of infection other than in the back. -- CT head no acute abnormalities.  MRI not obtainable secondary to pacemaker. -- no further evaluation planned  AKI (acute kidney injury) ([place]) superimposed on CKD stage IIIb --Baseline creatinine appears around 1.4-1.5 --appears stable now  Chronic diastolic CHF (congestive heart failure) (HCC) --LVEF 42-59%, diastolic function could not be  determined.  --no obvious valve abnormalities  Atrial fibrillation ([place]) --CHA2DS2-VASC 7; resume apixaban   --Rate control with metoprolol. .  Diabetes mellitus (HCC) --A1C 6.4 --CBG stable. Continue SSI. Has not needed long-acting insulin at home.  Essential hypertension --monitor  GERD --Continue PPI   ESOPHAGEAL STRICTURE --History of esophageal stricture and dysphagia. Can't exclude distal esophageal stricture on CT; consider outpatient GI evaluation.        Subjective:  Feels better  Physical Exam: Vitals:   06/12/22 0113 06/12/22 0500 06/12/22 0510 06/12/22 0904  BP: (!) 168/74  (!) 165/68 (!) 180/89  Pulse: 69  77 86  Resp: '15  20 18  '$ Temp: 98.4 F (36.9 C)  98.3 F (36.8 C) 98.3 F (36.8 C)  TempSrc: Oral  Oral Oral  SpO2: 100%  100% 100%  Weight:  61.2 kg    Height:       Physical Exam Vitals reviewed.  Constitutional:      General: He is not in acute distress.    Appearance: He is not ill-appearing or toxic-appearing.  Cardiovascular:     Rate and Rhythm: Normal rate and regular rhythm.     Heart sounds: No murmur heard. Pulmonary:     Effort: Pulmonary effort is normal. No respiratory distress.     Breath sounds: No wheezing, rhonchi or rales.  Musculoskeletal:     Left lower leg: No edema.     Comments: Able to lift left leg off bed  Neurological:     Mental Status: He is alert.  Psychiatric:        Mood and Affect: Mood normal.        Behavior: Behavior normal.     Data Reviewed:  CBG stable  Family Communication: none  Disposition: Status is: Inpatient Remains inpatient appropriate because: IV abx, needs SNF  Planned Discharge Destination: Skilled nursing facility    Time spent: 20 minutes  Author: Murray Hodgkins, MD 06/12/2022 4:43 PM  For on call review [email or web address].

## 2022-06-12 NOTE — Progress Notes (Signed)
PHARMACY NOTE:  ANTIMICROBIAL RENAL DOSAGE ADJUSTMENT  Current antimicrobial regimen includes a mismatch between antimicrobial dosage and estimated renal function.  As per policy approved by the Pharmacy & Therapeutics and Medical Executive Committees, the antimicrobial dosage will be adjusted accordingly.  Current antimicrobial dosage:  Ceftazidime 2 g IV q12hr  Indication: discitis/osteomyelitis  Renal Function: Scr 1.82, CrCl 25.7  Estimated Creatinine Clearance: 25.7 mL/min (A) (by C-G formula based on SCr of 1.82 mg/dL (H)). '[]'$      On intermittent HD, scheduled: '[]'$      On CRRT    Antimicrobial dosage has been changed to:  Ceftazidime 2 g IV q24hr   Thank you for allowing pharmacy to be a part of this patient's care.  Varney Daily, PharmD PGY2 Pharmacy Resident  Please check AMION for all [facility] pharmacy phone numbers After 10:00 PM call main pharmacy [phone]

## 2022-06-12 NOTE — Progress Notes (Signed)
Daily Progress Note   Patient Name: [name]       Date: 06/12/2022 DOB: June 12, 1935  Age: 87 y.o. MRN#: [id number] Attending Physician: Samuella Cota, MD Primary Care Physician: Seward Carol, MD Admit Date: 06/08/2022  Reason for Consultation/Follow-up: Establishing goals of care  Subjective: Chart review performed. Received report from primary RN - no acute concerns. RN reports patient is eating/drinking well.   Went to visit patient at bedside - daughter/[name] present. [name], alert, oriented, and able to participate in conversation. No signs or non-verbal gestures of pain or discomfort noted. No respiratory distress, increased work of breathing, or secretions noted. Patient reports is his pain is well managed. Patient has not had a BM in 4 days - discussed importance of constipation management while taking opioids - will start miralax.   Reviewed that ID has recommended PICC line and antibiotics for 6 weeks. Education provided on PICC line. Patient is agreeable to PICC and antibiotics for recommended course. Also, reviewed PT recommendations for discharge to rehab. Patient is very motivated to work with PT/OT to try and regain strength. Patient/family request rehab facility on [place] - will notify TOC. Patient's short term goal is discharge to rehab with long term goal to hopefully return home. Outpatient Palliative Care explained and offered - patient is agreeable.  Emotional support provided as patient reflects on how he has been "hurting for months."  Introduced, reviewed, and completed MOST form as outlined under recommendation section below. Patient is clear that he "doesn't want to die" but does not want any "unnecessary" medical interventions to  prolong his life if he were approaching end of life.  All questions and concerns addressed. Encouraged to call with questions and/or concerns. PMT card provided.  Length of Stay: 4  Current Medications: Scheduled Meds:   cyanocobalamin  1,000 mcg Intramuscular Daily   diltiazem  180 mg Oral Daily   heparin injection (subcutaneous)  5,000 Units Subcutaneous Q8H   hydrocerin   Topical BID   insulin aspart  0-9 Units Subcutaneous TID WC   pantoprazole (PROTONIX) IV  40 mg Intravenous Q24H    Continuous Infusions:  [START ON 06/13/2022] cefTAZidime (FORTAZ)  IV     vancomycin 500 mg (06/11/22 1550)    PRN Meds: acetaminophen **OR** acetaminophen, HYDROmorphone (DILAUDID) injection,  metoprolol tartrate, oxyCODONE  Physical Exam Vitals and nursing note reviewed.  Constitutional:      General: He is not in acute distress.    Appearance: He is ill-appearing.  Pulmonary:     Effort: No respiratory distress.  Skin:    General: Skin is warm and dry.  Neurological:     Mental Status: He is alert and oriented to person, place, and time.     Motor: Weakness present.  Psychiatric:        Attention and Perception: Attention normal.        Behavior: Behavior is cooperative.        Cognition and Memory: Cognition and memory normal.             Vital Signs: BP (!) 180/89 (BP Location: Right Arm)   Pulse 86   Temp 98.3 F (36.8 C) (Oral)   Resp 18   Ht '5\' 10"'$  (1.778 m)   Wt 61.2 kg   SpO2 100%   BMI 19.36 kg/m  SpO2: SpO2: 100 % O2 Device: O2 Device: Room Air O2 Flow Rate: O2 Flow Rate (L/min): 2 L/min  Intake/output summary:  Intake/Output Summary (Last 24 hours) at 06/12/2022 1324 Last data filed at 06/12/2022 0900 Gross per 24 hour  Intake 780 ml  Output 650 ml  Net 130 ml   LBM:   Baseline Weight: Weight: 72.6 kg Most recent weight: Weight: 61.2 kg       Palliative Assessment/Data: PPS 30%      Patient Active Problem List   Diagnosis Date Noted   Acute  discitis/osteomyelitis of thoracic/lumbar region of spine  64/33/2951   Acute metabolic encephalopathy 88/41/6606   Sepsis ([place]) 03/23/2017   AKI (acute kidney injury) ([place]) superimposed on CKD stage IIIb 03/23/2017   Leukocytosis    Cellulitis 03/22/2017   Diabetic foot ulcer associated with type 2 diabetes mellitus ([place]) 11/24/2014   Squamous cell carcinoma of scalp and skin of neck 05/29/2014   Unilateral AKA, right ([place]) 04/08/2014   PAD (peripheral artery disease) ([place]) 04/08/2014   Gangrene associated with type 2 diabetes mellitus ([place]) 04/02/2014   Generalized weakness 03/23/2014   Wound dehiscence 03/15/2014   Coagulopathy ([place]) 03/09/2014   Anemia due to acute blood loss 03/08/2014   Diabetes mellitus ([place]) 03/02/2014   Anemia 03/01/2014   Constipation 02/18/2014   Insomnia 02/18/2014   Amputation stump infection ([place]) 02/06/2014   Hx of right BKA ([name]) 01/28/2014   Osteomyelitis ([place]) 01/16/2014   Diabetic osteomyelitis ([place]) 01/09/2014   Pacemaker- [name] 02/20/2013   Hyperlipidemia 12/12/2012   TIA (transient ischemic attack) 12/11/2012   ETOH abuse 04/02/2012   ACTINIC KERATOSIS 11/29/2010   MULTIPLE&UNSPEC OPEN WOUND LOWER LIMB COMP 10/19/2010   ADHESIVE CAPSULITIS OF SHOULDER 08/30/2010   ESOPHAGEAL STRICTURE 05/19/2010   DYSPHAGIA UNSPECIFIED 04/08/2010   DUPUYTREN'S CONTRACTURE, LEFT 12/21/2009   CERVICAL STRAIN, ACUTE 08/28/2009   OVERWEIGHT/OBESITY 04/07/2009   RBBB 04/07/2009   GERD 04/07/2009   SYNCOPE AND COLLAPSE 04/07/2009   FATIGUE / MALAISE 04/07/2009   PHLEBITIS&THROMBOPHLEB SUP VESSELS LOWER EXTREM 02/28/2008   CELLULITIS/ABSCESS, LEG 02/28/2008   BACK PAIN, CHRONIC 10/31/2007   Unspecified venous (peripheral) insufficiency 07/02/2007   GOUT 06/26/2007   Essential hypertension 06/26/2007   Atrial fibrillation ([place]) 06/26/2007   Chronic diastolic CHF (congestive heart failure) ([place]) 06/26/2007    Palliative Care Assessment & Plan    Patient Profile: 87 y.o. male  with past medical history of HTN, PAF on eliquis, T0ZS, diastolic  CHF, SSS s/p St. Jude PPM in 06/2011, GERD/esophgaeal stricture presented to ED on 06/08/22 from home with AMS. Patient was admitted on 06/08/2022 with acute discitis/osteomylitis of the thoracic/lumbar region of spine, acute encephalopathy, AKI on CKD 3b.    Assessment: Principal Problem:   Acute discitis/osteomyelitis of thoracic/lumbar region of spine  Active Problems:   Essential hypertension   Atrial fibrillation (HCC)   Chronic diastolic CHF (congestive heart failure) (HCC)   ESOPHAGEAL STRICTURE   GERD   Hyperlipidemia   Diabetes mellitus ([place])   AKI (acute kidney injury) ([place]) superimposed on CKD stage IIIb   Acute metabolic encephalopathy   Recommendations/Plan: Continue full scope treatment Continue DNR/DNI as previously documented - durable DNR form completed and placed in shadow chart Patient's goal is for discharge to rehab with long term goal to be able to return home He is agreeable for 6 weeks of antibiotics and PICC placement Started Miralax daily  TOC consulted for: outpatient palliative care referral MOST form updated and completed as follows: DNR (Do not intubate), Limited additional interventions, Determine use or limitation of antibiotics when infection occurs, IVF for defined trial period, No feeding tube. Copy was made and will be scanned into Vynca/ACP tab PMT will continue to follow peripherally. If there are any imminent needs please call the service directly  Goals of Care and Additional Recommendations: Limitations on Scope of Treatment: Full Scope Treatment  Code Status:    Code Status Orders  (From admission, onward)           Start     Ordered   06/09/22 1104  Do not attempt resuscitation (DNR)  Continuous       Question Answer Comment  In the event of cardiac or respiratory ARREST Do not call a "code blue"   In the event of cardiac or  respiratory ARREST Do not perform Intubation, CPR, defibrillation or ACLS   In the event of cardiac or respiratory ARREST Use medication by any route, position, wound care, and other measures to relive pain and suffering. May use oxygen, suction and manual treatment of airway obstruction as needed for comfort.      06/09/22 1103           Code Status History     Date Active Date Inactive Code Status Order ID Comments User Context   06/08/2022 1447 06/09/2022 1103 Full Code [id number]  Orma Flaming, MD ED   03/23/2017 0117 03/25/2017 2219 Full Code [id number]  Karmen Bongo, MD Inpatient   04/02/2014 1516 04/05/2014 1811 Full Code [id number]  [name], MD Inpatient   03/18/2014 2112 03/20/2014 1937 Full Code [id number]  [name], MD Inpatient   03/15/2014 2233 03/18/2014 2112 Full Code [id number]  Berle Mull, MD ED   03/09/2014 0003 03/11/2014 1819 Full Code [id number]  Theressa Millard, MD Inpatient   03/08/2014 2356 03/09/2014 0003 DNR [id number]  Theressa Millard, MD Inpatient   03/08/2014 2345 03/08/2014 2356 Full Code [id number]  Theressa Millard, MD ED   03/02/2014 0335 03/03/2014 1800 DNR [id number]  Berle Mull, MD Inpatient   03/01/2014 2353 03/02/2014 0335 Full Code [id number]  Berle Mull, MD Inpatient   01/28/2014 1440 03/01/2014 2353 DNR [id number] No cpr, defibrillation, intubation or mechanical ventilation [name], DO Outpatient   01/20/2014 1038 01/24/2014 1837 Full Code [id number]  [name], MD Inpatient   01/17/2014 1416 01/20/2014 1038 Full Code [id number]  Theodoro Kos, DO Inpatient   01/16/2014 2008 01/17/2014 1416 Full Code  [id number]  Theodis Blaze, MD Inpatient   12/11/2012 2117 12/12/2012 1826 Full Code [id number]  Ernestine Conrad, RN Inpatient       Prognosis:  Unable to determine  Discharge Planning: [place] for rehab with Palliative care service follow-up  Care plan was discussed with primary RN, patient, patient's family, TOC,  attending  Thank you for allowing the Palliative Medicine Team to assist in the care of this patient.   Total Time 60 minutes Prolonged Time Billed  no       Greater than 50%  of this time was spent counseling and coordinating care related to the above assessment and plan.  Lin Landsman, NP  Please contact Palliative Medicine Team phone at [phone] for questions and concerns.   *Portions of this note are a verbal dictation therefore any spelling and/or grammatical errors are due to the "[employer] One" system interpretation.

## 2022-06-12 NOTE — Progress Notes (Signed)
Brief Palliative Medicine Progress Note:  Met with patient and daughter at bedside for continued GOC. Full note to follow:  Recommendations/Plan: Continue full scope treatment Continue DNR/DNI as previously documented - durable DNR form completed and placed in shadow chart Patient's goal is for discharge to rehab with long term goal to be able to return home He is agreeable for 6 weeks of antibiotics and PICC placement Started Miralax daily  TOC consulted for: outpatient palliative care referral MOST form updated and completed as follows: DNR (Do not intubate), Limited additional interventions, Determine use or limitation of antibiotics when infection occurs, IVF for defined trial period, No feeding tube. Copy was made and will be scanned into Vynca/ACP tab PMT will continue to follow peripherally. If there are any imminent needs please call the service directly  Thank you for allowing PMT to assist in the care of this patient.  Amber M. Smith, FNP-BC Palliative Medicine Team Team Phone: [phone] NO CHARGE  

## 2022-06-12 NOTE — NC FL2 (Signed)
[place] LEVEL OF CARE SCREENING TOOL     IDENTIFICATION  Patient Name: [name] Birthdate: Oct 21, 1935 Sex: male Admission Date (Current Location): 06/08/2022  [facility] and [place] Number:  Herbalist and Address:  [facility] [place] [address], [place], [place] [address]      Provider Number: [id number]  Attending Physician Name and Address:  Samuella Cota, MD  Relative Name and Phone Number:       Current Level of Care: Hospital Recommended Level of Care: [place] Prior Approval Number:    Date Approved/Denied:   PASRR Number: [id number]  Discharge Plan: SNF    Current Diagnoses: Patient Active Problem List   Diagnosis Date Noted   Acute discitis/osteomyelitis of thoracic/lumbar region of spine  93/57/0177   Acute metabolic encephalopathy 93/90/3009   Sepsis ([place]) 03/23/2017   AKI (acute kidney injury) ([place]) superimposed on CKD stage IIIb 03/23/2017   Leukocytosis    Cellulitis 03/22/2017   Diabetic foot ulcer associated with type 2 diabetes mellitus ([place]) 11/24/2014   Squamous cell carcinoma of scalp and skin of neck 05/29/2014   Unilateral AKA, right ([place]) 04/08/2014   PAD (peripheral artery disease) ([place]) 04/08/2014   Gangrene associated with type 2 diabetes mellitus ([place]) 04/02/2014   Generalized weakness 03/23/2014   Wound dehiscence 03/15/2014   Coagulopathy ([place]) 03/09/2014   Anemia due to acute blood loss 03/08/2014   Diabetes mellitus ([place]) 03/02/2014   Anemia 03/01/2014   Constipation 02/18/2014   Insomnia 02/18/2014   Amputation stump infection ([place]) 02/06/2014   Hx of right BKA ([place]) 01/28/2014   Osteomyelitis ([place]) 01/16/2014   Diabetic osteomyelitis ([place]) 01/09/2014   Pacemaker- [name] 02/20/2013   Hyperlipidemia 12/12/2012   TIA (transient ischemic attack) 12/11/2012   ETOH abuse 04/02/2012   ACTINIC KERATOSIS 11/29/2010   MULTIPLE&UNSPEC OPEN WOUND LOWER LIMB  COMP 10/19/2010   ADHESIVE CAPSULITIS OF SHOULDER 08/30/2010   ESOPHAGEAL STRICTURE 05/19/2010   DYSPHAGIA UNSPECIFIED 04/08/2010   DUPUYTREN'S CONTRACTURE, LEFT 12/21/2009   CERVICAL STRAIN, ACUTE 08/28/2009   OVERWEIGHT/OBESITY 04/07/2009   RBBB 04/07/2009   GERD 04/07/2009   SYNCOPE AND COLLAPSE 04/07/2009   FATIGUE / MALAISE 04/07/2009   PHLEBITIS&THROMBOPHLEB SUP VESSELS LOWER EXTREM 02/28/2008   CELLULITIS/ABSCESS, LEG 02/28/2008   BACK PAIN, CHRONIC 10/31/2007   Unspecified venous (peripheral) insufficiency 07/02/2007   GOUT 06/26/2007   Essential hypertension 06/26/2007   Atrial fibrillation ([name]) 06/26/2007   Chronic diastolic CHF (congestive heart failure) ([place]) 06/26/2007    Orientation RESPIRATION BLADDER Height & Weight     Self, Time, Situation, Place (forgetful)  Normal Incontinent Weight: 134 lb 14.7 oz (61.2 kg) Height:  '5\' 10"'$  (177.8 cm)  BEHAVIORAL SYMPTOMS/MOOD NEUROLOGICAL BOWEL NUTRITION STATUS      Continent    AMBULATORY STATUS COMMUNICATION OF NEEDS Skin   Extensive Assist (Right AKA)   Other (Comment) (Pressure Injury 06/11/22 Sacrum Medial Stage 2 -  Partial thickness loss of dermis presenting as a shallow open injury with a red, pink wound bed without slough)                       Personal Care Assistance Level of Assistance  Bathing, Feeding, Dressing Bathing Assistance: Limited assistance Feeding assistance: Limited assistance Dressing Assistance: Limited assistance     Functional Limitations Info  Sight, Hearing, Speech Sight Info: Impaired Hearing Info: Adequate Speech Info: Adequate    SPECIAL CARE FACTORS FREQUENCY  PT (By  licensed PT), OT (By licensed OT) (6 weeks IV abx)                    Contractures Contractures Info: Not present    Additional Factors Info  Code Status Code Status Info: DNR             Current Medications (06/12/2022):  This is the current hospital active medication list Current  Facility-Administered Medications  Medication Dose Route Frequency Provider Last Rate Last Admin   acetaminophen (TYLENOL) tablet 650 mg  650 mg Oral Q6H PRN Orma Flaming, MD   650 mg at 06/11/22 2952   Or   acetaminophen (TYLENOL) suppository 650 mg  650 mg Rectal Q6H PRN Orma Flaming, MD       Derrill Memo ON 06/13/2022] cefTAZidime (FORTAZ) 2 g in sodium chloride 0.9 % 100 mL IVPB  2 g Intravenous Q24H Thayer Headings, MD       cyanocobalamin ((VITAMIN B-12)) injection 1,000 mcg  1,000 mcg Intramuscular Daily Samuella Cota, MD   1,000 mcg at 06/12/22 0845   diltiazem (CARDIZEM CD) 24 hr capsule 180 mg  180 mg Oral Daily Orma Flaming, MD   180 mg at 06/12/22 0845   heparin injection 5,000 Units  5,000 Units Subcutaneous Q8H Samuella Cota, MD   5,000 Units at 06/12/22 1329   hydrocerin (EUCERIN) cream   Topical BID Samuella Cota, MD   Given at 06/12/22 1155   HYDROmorphone (DILAUDID) injection 1 mg  1 mg Intravenous Q3H PRN Samuella Cota, MD       insulin aspart (novoLOG) injection 0-9 Units  0-9 Units Subcutaneous TID WC Orma Flaming, MD   2 Units at 06/12/22 1154   metoprolol tartrate (LOPRESSOR) injection 10 mg  10 mg Intravenous Q6H PRN Samuella Cota, MD       oxyCODONE (Oxy IR/ROXICODONE) immediate release tablet 5-10 mg  5-10 mg Oral Q4H PRN Samuella Cota, MD   10 mg at 06/12/22 1329   pantoprazole (PROTONIX) injection 40 mg  40 mg Intravenous Q24H Orma Flaming, MD   40 mg at 06/11/22 1716   vancomycin (VANCOREADY) IVPB 500 mg/100 mL  500 mg Intravenous Q24H Jerilynn Birkenhead, RPH 100 mL/hr at 06/11/22 1550 500 mg at 06/11/22 1550     Discharge Medications: Please see discharge summary for a list of discharge medications.  Relevant Imaging Results:  Relevant Lab Results:   Additional Information SS# [id number]; 6 week course of treatment with vancomycin and ceftriaxone  Amador Cunas, LCSW

## 2022-06-12 NOTE — TOC Initial Note (Signed)
Transition of Care [facility]) - Initial/Assessment Note    Patient Details  Name: [name] MRN: [id number] Date of Birth: [birth year]  Transition of Care [facility]) CM/SW Contact:    Amador Cunas, [place] Phone Number: 06/12/2022, 2:10 PM  Clinical Narrative: Spoke with pt's wife Lelon Frohlich [phone] re recommendation for SNF. Pt's wife reports agreeable to SNF for rehab and completion of IV abx. Pt with previous SNF stay at [facility] in 2015, pt's wife reports no facility preference at this time. Reviewed SNF placement process and answered questions. Will f/u with offers as available.   [facility] [place] auth request submitted (ref # [id number]). Will need to update Navi with facility choice when known.   Wandra Feinstein, MSW, LCSW [phone] (coverage)               Expected Discharge Plan: [place] Barriers to Discharge: Continued Medical Work up, SNF Pending bed offer, Insurance Authorization   Patient Goals and CMS Choice     Choice offered to / list presented to : Spouse  Expected Discharge Plan and Services Expected Discharge Plan: [place] arrangements for the past 2 months: Apartment                                      Prior Living Arrangements/Services Living arrangements for the past 2 months: Apartment Lives with:: Spouse Patient language and need for interpreter reviewed:: No Do you feel safe going back to the place where you live?: Yes      Need for Family Participation in Patient Care: Yes (Comment) Care giver support system in place?: Yes (comment)   Criminal Activity/Legal Involvement Pertinent to Current Situation/Hospitalization: No - Comment as needed  Activities of Daily Living      Permission Sought/Granted Permission sought to share information with : Facility Sport and exercise psychologist, Family Supports Permission granted to share information with : Yes, Verbal Permission Granted               Emotional Assessment       Orientation: : Oriented to Self, Oriented to Place, Oriented to  Time, Oriented to Situation Alcohol / Substance Use: Not Applicable Psych Involvement: No (comment)  Admission diagnosis:  Discitis of lumbar region [M46.46] Osteomyelitis of other site, unspecified type [facility]) [M86.9] Patient Active Problem List   Diagnosis Date Noted   Acute discitis/osteomyelitis of thoracic/lumbar region of spine  17/61/6073   Acute metabolic encephalopathy 71/04/2693   Sepsis ([place]) 03/23/2017   AKI (acute kidney injury) ([place]) superimposed on CKD stage IIIb 03/23/2017   Leukocytosis    Cellulitis 03/22/2017   Diabetic foot ulcer associated with type 2 diabetes mellitus ([place]) 11/24/2014   Squamous cell carcinoma of scalp and skin of neck 05/29/2014   Unilateral AKA, right ([place]) 04/08/2014   PAD (peripheral artery disease) ([place]) 04/08/2014   Gangrene associated with type 2 diabetes mellitus ([place]) 04/02/2014   Generalized weakness 03/23/2014   Wound dehiscence 03/15/2014   Coagulopathy ([place]) 03/09/2014   Anemia due to acute blood loss 03/08/2014   Diabetes mellitus ([place]) 03/02/2014   Anemia 03/01/2014   Constipation 02/18/2014   Insomnia 02/18/2014   Amputation stump infection ([place]) 02/06/2014   Hx of right BKA ([place]) 01/28/2014   Osteomyelitis ([place]) 01/16/2014   Diabetic osteomyelitis ([place]) 01/09/2014   Pacemaker- [name] 02/20/2013   Hyperlipidemia 12/12/2012   TIA (transient ischemic attack)  12/11/2012   ETOH abuse 04/02/2012   ACTINIC KERATOSIS 11/29/2010   MULTIPLE&UNSPEC OPEN WOUND LOWER LIMB COMP 10/19/2010   ADHESIVE CAPSULITIS OF SHOULDER 08/30/2010   ESOPHAGEAL STRICTURE 05/19/2010   DYSPHAGIA UNSPECIFIED 04/08/2010   DUPUYTREN'S CONTRACTURE, LEFT 12/21/2009   CERVICAL STRAIN, ACUTE 08/28/2009   OVERWEIGHT/OBESITY 04/07/2009   RBBB 04/07/2009   GERD 04/07/2009   SYNCOPE AND COLLAPSE 04/07/2009   FATIGUE / MALAISE 04/07/2009    PHLEBITIS&THROMBOPHLEB SUP VESSELS LOWER EXTREM 02/28/2008   CELLULITIS/ABSCESS, LEG 02/28/2008   BACK PAIN, CHRONIC 10/31/2007   Unspecified venous (peripheral) insufficiency 07/02/2007   GOUT 06/26/2007   Essential hypertension 06/26/2007   Atrial fibrillation ([place]) 06/26/2007   Chronic diastolic CHF (congestive heart failure) ([place]) 06/26/2007   PCP:  Seward Carol, MD Pharmacy:   [place], [place] [address] Phone: [phone] Fax: [phone]  [facility] - [place], [place] [facility] [address] [place] [address] [place] [address] Phone: [phone] Fax: [phone]     Social Determinants of Health (SDOH) Interventions    Readmission Risk Interventions     No data to display

## 2022-06-12 NOTE — Evaluation (Signed)
Physical Therapy Evaluation Patient Details Name: [name] MRN: [id number] DOB: 12/01/1934 Today's Date: 06/12/2022  History of Present Illness  Pt is an 87 y/o male presented with sudden onset of confusion and worsening low back pain.  Work-up revealed acute discitis and osteomyelitis of the thoracic and lumbar spine. PMH including but not limited to right AKA, HTN, a-fib, CHF.  Clinical Impression  Pt presented supine in bed with HOB elevated, awake and willing to participate in therapy session. Prior to admission, pt reported that he was able to complete transfers into and out of a manual w/c and was able to propel himself in his w/c for mobilization. He lives with his wife in a single level apartment with an elevator entry. At the time of evaluation, pt able to complete bed mobility with min A and sit upright at EOB with 1-2 UE supports and close supervision for safety. Pt would continue to benefit from skilled physical therapy services at this time while admitted and after d/c to address the below listed limitations in order to improve overall safety and independence with functional mobility.      Recommendations for follow up therapy are one component of a multi-disciplinary discharge planning process, led by the attending physician.  Recommendations may be updated based on patient status, additional functional criteria and insurance authorization.  Follow Up Recommendations Skilled nursing-short term rehab (<3 hours/day) Can patient physically be transported by private vehicle: No    Assistance Recommended at Discharge Frequent or constant Supervision/Assistance  Patient can return home with the following  Two people to help with walking and/or transfers;Two people to help with bathing/dressing/bathroom;Assistance with cooking/housework;Assist for transportation;Help with stairs or ramp for entrance    Equipment Recommendations None recommended by PT  Recommendations for Other  Services       Functional Status Assessment Patient has had a recent decline in their functional status and demonstrates the ability to make significant improvements in function in a reasonable and predictable amount of time.     Precautions / Restrictions Precautions Precautions: Fall;Back Spinal Brace: Thoracolumbosacral orthotic;Applied in sitting position Restrictions Weight Bearing Restrictions: No      Mobility  Bed Mobility Overal bed mobility: Needs Assistance Bed Mobility: Rolling, Sidelying to Sit, Sit to Supine Rolling: Min assist Sidelying to sit: Min assist   Sit to supine: Min assist   General bed mobility comments: increased time and effort, use of bed rails, assistance for trunk elevation and to fully return L LE onto bed    Transfers                   General transfer comment: pt declining, wanting to return to bed to finish his breakfast    Ambulation/Gait                  Stairs            Wheelchair Mobility    Modified Rankin (Stroke Patients Only)       Balance Overall balance assessment: Needs assistance Sitting-balance support: Single extremity supported, Bilateral upper extremity supported Sitting balance-Leahy Scale: Poor                                       Pertinent Vitals/Pain Pain Assessment Pain Assessment: No/denies pain    Home Living Family/patient expects to be discharged to:: Skilled nursing facility  Additional Comments: pt is wanting to go to SNF before returning home    Prior Function Prior Level of Function : Needs assist             Mobility Comments: Was able to SPT to/from wheelchair, and self propel w/c in his apartment.  Did need assist for in/out of the car via staff at apartment. ADLs Comments: Sponge bathes w/c level, cooks and cleans w/c level     Hand Dominance   Dominant Hand: Right    Extremity/Trunk Assessment   Upper Extremity  Assessment Upper Extremity Assessment: Defer to OT evaluation    Lower Extremity Assessment Lower Extremity Assessment: RLE deficits/detail RLE Deficits / Details: hx of R AKA    Cervical / Trunk Assessment Cervical / Trunk Assessment: Kyphotic  Communication   Communication: No difficulties  Cognition Arousal/Alertness: Awake/alert Behavior During Therapy: WFL for tasks assessed/performed Overall Cognitive Status: No family/caregiver present to determine baseline cognitive functioning Area of Impairment: Memory, Following commands, Problem solving, Awareness                     Memory: Decreased short-term memory Following Commands: Follows one step commands with increased time   Awareness: Emergent Problem Solving: Slow processing, Difficulty sequencing          General Comments      Exercises     Assessment/Plan    PT Assessment Patient needs continued PT services  PT Problem List Decreased strength;Decreased balance;Decreased mobility;Decreased coordination;Decreased cognition;Decreased knowledge of use of DME;Decreased safety awareness;Decreased knowledge of precautions;Pain       PT Treatment Interventions DME instruction;Functional mobility training;Therapeutic activities;Balance training;Therapeutic exercise;Neuromuscular re-education;Cognitive remediation;Patient/family education;Wheelchair mobility training    PT Goals (Current goals can be found in the Care Plan section)  Acute Rehab PT Goals Patient Stated Goal: to go to SNF before returning home PT Goal Formulation: With patient Time For Goal Achievement: 06/26/22 Potential to Achieve Goals: Good    Frequency Min 2X/week     Co-evaluation               AM-PAC PT "6 Clicks" Mobility  Outcome Measure Help needed turning from your back to your side while in a flat bed without using bedrails?: A Little Help needed moving from lying on your back to sitting on the side of a flat bed  without using bedrails?: A Little Help needed moving to and from a bed to a chair (including a wheelchair)?: Total Help needed standing up from a chair using your arms (e.g., wheelchair or bedside chair)?: Total Help needed to walk in hospital room?: Total Help needed climbing 3-5 steps with a railing? : Total 6 Click Score: 10    End of Session   Activity Tolerance: Patient tolerated treatment well Patient left: in bed;with call bell/phone within reach;with bed alarm set Nurse Communication: Mobility status;Need for lift equipment PT Visit Diagnosis: Other abnormalities of gait and mobility (R26.89);Pain Pain - part of body:  (back)    Time: 0786-7544 PT Time Calculation (min) (ACUTE ONLY): 30 min   Charges:   PT Evaluation $PT Eval Moderate Complexity: 1 Mod PT Treatments $Therapeutic Activity: 8-22 mins        Anastasio Champion, DPT  Acute Rehabilitation Services Office [place] 06/12/2022, 10:51 AM

## 2022-06-12 NOTE — Progress Notes (Signed)
Pharmacy Antibiotic Note  [name] is a 87 y.o. male admitted on 06/08/2022 with osteomyelitis. Pharmacy has been consulted for vancomycin dosing. Patient is afebrile, WBC wnl, renal function is declining.   Vancomycin peak 30, vancomycin trough 20, calculated AUC 596. Levels drawn appropriately. Will reduce dose in the setting of worsening renal function.   Plan: Decrease vancomycin to 750 mg IV q48h (aAUC 447) Continue ceftazidime 2 gram IV q24 hrs  Monitor renal function, s/sx of improvement F/u vancomycin levels at SS  Height: '5\' 10"'$  (177.8 cm) Weight: 61.2 kg (134 lb 14.7 oz) IBW/kg (Calculated) : 73  Temp (24hrs), Avg:98.4 F (36.9 C), Min:98.3 F (36.8 C), Max:98.4 F (36.9 C)  Recent Labs  Lab 06/08/22 0928 06/08/22 1124 06/09/22 0359 06/10/22 0529 06/11/22 0618 06/11/22 1759 06/12/22 0403 06/12/22 1516  WBC 8.6  --  10.3 11.0* 7.0  --  6.6  --   CREATININE 1.74*  --  1.70*  --  1.82*  --   --   --   LATICACIDVEN 1.3 1.5  --   --   --   --   --   --   VANCOTROUGH  --   --   --   --   --   --   --  20  VANCOPEAK  --   --   --   --   --  30  --   --      Estimated Creatinine Clearance: 25.7 mL/min (A) (by C-G formula based on SCr of 1.82 mg/dL (H)).    Allergies  Allergen Reactions   Morphine Rash and Other (See Comments)    Irritability also   Statins Other (See Comments)    dizzy    Antimicrobials this admission: 7/19 cefepime >> 7/21 7/19 Zosyn >> X1 dose only 7/19 Vanc >> 7/21 Ceftazidime >>  Dose adjustments this admission: Ceftazidime dosed for CrCl 25.7 mL/min (2 grams IV q24hr per Uptodate)  Microbiology results: 7/19 BCx: NGTD 7/19 UCx: NGTD  Louanne Belton, PharmD, [facility] PGY1 Pharmacy Resident 06/12/2022 5:20 PM

## 2022-06-13 ENCOUNTER — Inpatient Hospital Stay: Payer: Self-pay

## 2022-06-13 DIAGNOSIS — M4646 Discitis, unspecified, lumbar region: Secondary | ICD-10-CM | POA: Diagnosis not present

## 2022-06-13 LAB — CULTURE, BLOOD (ROUTINE X 2)
Culture: NO GROWTH
Culture: NO GROWTH
Special Requests: ADEQUATE
Special Requests: ADEQUATE

## 2022-06-13 LAB — GLUCOSE, CAPILLARY
Glucose-Capillary: 138 mg/dL — ABNORMAL HIGH (ref 70–99)
Glucose-Capillary: 207 mg/dL — ABNORMAL HIGH (ref 70–99)
Glucose-Capillary: 131 mg/dL — ABNORMAL HIGH (ref 70–99)
Glucose-Capillary: 156 mg/dL — ABNORMAL HIGH (ref 70–99)

## 2022-06-13 LAB — CBC
HCT: 31.9 % — ABNORMAL LOW (ref 39.0–52.0)
Hemoglobin: 10.7 g/dL — ABNORMAL LOW (ref 13.0–17.0)
MCH: 27.8 pg (ref 26.0–34.0)
MCHC: 33.5 g/dL (ref 30.0–36.0)
MCV: 82.9 fL (ref 80.0–100.0)
Platelets: 193 10*3/uL (ref 150–400)
RBC: 3.85 MIL/uL — ABNORMAL LOW (ref 4.22–5.81)
RDW: 14.5 % (ref 11.5–15.5)
WBC: 6.6 10*3/uL (ref 4.0–10.5)
nRBC: 0 % (ref 0.0–0.2)

## 2022-06-13 MED ORDER — SODIUM CHLORIDE 0.9 % IV SOLN
2.0000 g | INTRAVENOUS | Status: DC
  Administered 2022-06-14 – 2022-06-15 (×2): 2 g via INTRAVENOUS
  Filled 2022-06-13 (×2): qty 20

## 2022-06-13 MED ORDER — BISACODYL 10 MG RE SUPP
10.0000 mg | Freq: Once | RECTAL | Status: AC
  Administered 2022-06-13: 10 mg via RECTAL
  Filled 2022-06-13: qty 1

## 2022-06-13 MED ORDER — SODIUM CHLORIDE 0.9 % IV SOLN
8.0000 mg/kg | INTRAVENOUS | Status: DC
  Administered 2022-06-13: 500 mg via INTRAVENOUS
  Filled 2022-06-13 (×2): qty 10

## 2022-06-13 NOTE — TOC Progression Note (Signed)
Transition of Care [facility]) - Progression Note    Patient Details  Name: [name] MRN: [id number] Date of Birth: [birth year]  Transition of Care [facility]) CM/SW [place], [place] Phone Number: 06/13/2022, 2:26 PM  Clinical Narrative:   CSW spoke with patient's wife about SNF offers, wife would like [facility] if possible. CSW sent referral and asked [facility] to review. [facility] can offer. CSW updated insurance authorization request with SNF choice. CSW to follow.    Expected Discharge Plan: [place] Barriers to Discharge: Continued Medical Work up, SNF Pending bed offer, Ship broker  Expected Discharge Plan and Services Expected Discharge Plan: [place] arrangements for the past 2 months: Apartment                                       Social Determinants of Health (SDOH) Interventions    Readmission Risk Interventions     No data to display

## 2022-06-13 NOTE — Progress Notes (Signed)
Progress Note   Patient: [name] DOB: 19-May-1935 DOA: 06/08/2022     5 DOS: the patient was seen and examined on 06/13/2022   Brief hospital course: 87 year old man lives at home with his wife, PMH right AKA, wheelchair dependent, mentally sharp, presented with sudden onset of confusion and worsening low back pain.  Work-up revealed acute discitis and osteomyelitis of the thoracic and lumbar spine.  On empiric antibiotics, neurosurgery, neurology, infectious disease and palliative medicine involved in this complex case.  Assessment and Plan: * Acute discitis/osteomyelitis of thoracic/lumbar region of spine. No MRI secondary to pacemaker.  CT showed acute discitis/osteomyelitis at T11-12 with significant endplate destruction and retropulsion of bony fragment into ventral spinal canal. Mild discitis/OM suspected at L2-3 level.  However he has a history of back abnormalities on imaging and infection is somewhat less than definite. --will treat 6 weeks with vanc and ceftriaxone (on discharge) per ID. PICC today, pt would never want HD --TLSO and can mobilize.  He is not a candidate for any intervention at this point.  However if he substantially improves, he can follow-up with Dr. Christella Noa for consideration of surgical intervention. --Palliative care also involved.   Acute metabolic encephalopathy -- Bizarre affect on admission, now resolved.  Discussed with neurology, etiology unclear, possibly related to pain.  Spot EEG unremarkable, long-term video EEG unremarkable. -- No signs or symptoms of infection other than in the back. -- CT head no acute abnormalities.  MRI not obtainable secondary to pacemaker. -- no further evaluation planned  AKI (acute kidney injury) ([place]) superimposed on CKD stage IIIb --Baseline creatinine appears around 1.4-1.5 --appears stable now   Chronic diastolic CHF (congestive heart failure) (HCC) --LVEF 66-59%, diastolic function could not be  determined.  --no obvious valve abnormalities   Atrial fibrillation ([place]) --CHA2DS2-VASC 7; resume apixaban   --Rate control with metoprolol. .   Diabetes mellitus (HCC) --A1C 6.4 --CBG stable. Continue SSI. Has not needed long-acting insulin at home.   Essential hypertension --monitor   GERD --Continue PPI    ESOPHAGEAL STRICTURE --History of esophageal stricture and dysphagia. Can't exclude distal esophageal stricture on CT; consider outpatient GI evaluation.       Subjective:  Feels better  Physical Exam: Vitals:   06/12/22 2310 06/13/22 0346 06/13/22 0427 06/13/22 0800  BP: (!) 155/69 (!) 164/74  (!) 154/85  Pulse: 72 71  74  Resp: '16 15  18  '$ Temp: 98.3 F (36.8 C) 98 F (36.7 C)  98.5 F (36.9 C)  TempSrc: Oral     SpO2: 100% 100%  100%  Weight:   61.5 kg   Height:       Physical Exam Vitals reviewed.  Constitutional:      General: He is not in acute distress.    Appearance: He is not ill-appearing or toxic-appearing.  Cardiovascular:     Rate and Rhythm: Normal rate and regular rhythm.     Heart sounds: No murmur heard. Pulmonary:     Effort: Pulmonary effort is normal. No respiratory distress.     Breath sounds: No wheezing, rhonchi or rales.  Musculoskeletal:     Left lower leg: No edema.     Comments: Moves LLE to command  Neurological:     Mental Status: He is alert.  Psychiatric:        Mood and Affect: Mood normal.        Speech: Speech normal.        Behavior: Behavior normal. Behavior  is cooperative.        Thought Content: Thought content normal.        Cognition and Memory: Cognition normal.     Data Reviewed:  CBG stable Hgb stable 10.7  Family Communication: have called wife last 2 days, no answer  Disposition: Status is: Inpatient Remains inpatient appropriate because: needs SNF  Planned Discharge Destination: Skilled nursing facility    Time spent: 20 minutes  Author: Murray Hodgkins, MD 06/13/2022 12:00 PM  For on  call review [email or web address].

## 2022-06-13 NOTE — Progress Notes (Addendum)
[facility] for Infectious Disease   Reason for visit: Follow up on possible discitis/osteomyelitis  Interval History: WBC 6.6, afebrile.  Day 6 total antibiotics  Physical Exam: Constitutional:  Vitals:   06/13/22 0346 06/13/22 0800  BP: (!) 164/74 (!) 154/85  Pulse: 71 74  Resp: 15 18  Temp: 98 F (36.7 C) 98.5 F (36.9 C)  SpO2: 100% 100%   patient appears in NAD Respiratory: Normal respiratory effort; CTA B  Review of Systems: Constitutional: negative for fevers and chills  Lab Results  Component Value Date   WBC 6.6 06/13/2022   HGB 10.7 (L) 06/13/2022   HCT 31.9 (L) 06/13/2022   MCV 82.9 06/13/2022   PLT 193 06/13/2022    Lab Results  Component Value Date   CREATININE 1.82 (H) 06/11/2022   BUN 25 (H) 06/11/2022   NA 132 (L) 06/11/2022   K 3.7 06/11/2022   CL 105 06/11/2022   CO2 18 (L) 06/11/2022    Lab Results  Component Value Date   ALT 14 06/08/2022   AST 16 06/08/2022   ALKPHOS 103 06/08/2022     Microbiology: Recent Results (from the past 240 hour(s))  Urine Culture     Status: None   Collection Time: 06/08/22  9:13 AM   Specimen: Urine, Clean Catch  Result Value Ref Range Status   Specimen Description URINE, CLEAN CATCH  Final   Special Requests NONE  Final   Culture   Final    NO GROWTH Performed at [facility], [address], [place], [place] [address]    Report Status 06/09/2022 FINAL  Final  Blood Culture (routine x 2)     Status: None (Preliminary result)   Collection Time: 06/08/22  9:28 AM   Specimen: BLOOD RIGHT FOREARM  Result Value Ref Range Status   Specimen Description BLOOD RIGHT FOREARM  Final   Special Requests   Final    BOTTLES DRAWN AEROBIC AND ANAEROBIC Blood Culture adequate volume   Culture   Final    NO GROWTH 4 DAYS Performed at [facility], [place] [address], [place], [place] [address]    Report Status PENDING  Incomplete  Blood Culture (routine x 2)     Status: None (Preliminary result)    Collection Time: 06/08/22 10:08 AM   Specimen: BLOOD LEFT HAND  Result Value Ref Range Status   Specimen Description BLOOD LEFT HAND  Final   Special Requests   Final    BOTTLES DRAWN AEROBIC AND ANAEROBIC Blood Culture adequate volume   Culture   Final    NO GROWTH 4 DAYS Performed at [facility], [place] [address], [place], [place] [address]    Report Status PENDING  Incomplete    Impression/Plan:  1. Discitis/osteomyelitis - somewhat new findings on top of previous, chronic findings concerning for infectious discitis/osteomyelitis, along with progressively worsening back pain over the last two months.  ESR, CRP not significantly elevated so the overall picture is muddied but I have discussed this with the patient and daughter and plan to treat for 6 weeks with IV antibiotics.    2.  Altered mental status - much improved and at his baseline now.  Unclear cause. Possibly from his infection.    3.  Access - I have ordered a picc line; ok to place with decreased GFR.    4. OPAT:  Diagnosis: Discitis/osteomyelitis  Culture Result: negative  Allergies  Allergen Reactions   Morphine Rash  and Other (See Comments)    Irritability also   Statins Other (See Comments)    dizzy    OPAT Orders Discharge antibiotics to be given via PICC line Discharge antibiotics: daptomycin 500 mg IV every 48 hours + ceftriaxone 2 grams IV every 24 hours Per pharmacy protocol yes Duration: 6 weeks End Date: 07/20/22  [facility] Care Per Protocol: yes  Home health RN for IV administration and teaching; PICC line care and labs.    Labs weekly while on IV antibiotics: _x_ CBC with differential __ BMP _x_ CMP _x_ CRP _x_ ESR __ Vancomycin trough _x_ CK  _x_ Please pull PIC at completion of IV antibiotics __ Please leave PIC in place until doctor has seen patient or been notified  Fax weekly labs to [phone]  Clinic Follow Up Appt: 07/12/22 with Dr. Linus Salmons  @ 9:30 am

## 2022-06-13 NOTE — Progress Notes (Signed)
PHARMACY CONSULT NOTE FOR:  OUTPATIENT  PARENTERAL ANTIBIOTIC THERAPY (OPAT)  Indication: Possible discitis/osteomyelitis  Regimen: Daptomycin 500 mg IV every 48 hours + Ceftriaxone 2 gm IV Q 24 hours  End date: 07/20/22  IV antibiotic discharge orders are pended. To discharging provider:  please sign these orders via discharge navigator,  Select New Orders & click on the button choice - Manage This Unsigned Work.     Thank you for allowing pharmacy to be a part of this patient's care.  Jimmy Footman, PharmD, BCPS, BCIDP Infectious Diseases Clinical Pharmacist Phone: [phone] 06/13/2022, 10:44 AM

## 2022-06-13 NOTE — Progress Notes (Signed)
Speech Language Pathology Treatment: Dysphagia  Patient Details Name: [name] MRN: [id number] DOB: 1935/09/13 Today's Date: 06/13/2022 Time: 5361-4431 SLP Time Calculation (min) (ACUTE ONLY): 11 min  Assessment / Plan / Recommendation Clinical Impression  Mr. Clouatre was alert and interactive; confusion persists. He demonstrated active attention to and mastication of regular solids.  Thin liquids consumed with no s/s of aspiration.  Recommend advancing diet to regular solids to allow greater variety of choices. Swallow function appears to be at baseline with no further concerns for aspiration. No further SLP f/u is needed. Our service will sign off.   HPI HPI: 87 year old man lives at home with his wife, PMH esophageal stricture, "can't exclude esohpageal stricture on CT" right AKA, wheelchair dependent, mentally sharp, presented with sudden onset of confusion and worsening low back pain. Work-up revealed acute discitis and osteomyelitis of the thoracic and lumbar spine. Per chart, neurosurgery, neurology, infectious disease and palliative medicine involved in this complex case. Cannot have MRI due to pacemaker.      SLP Plan  All goals met      Recommendations for follow up therapy are one component of a multi-disciplinary discharge planning process, led by the attending physician.  Recommendations may be updated based on patient status, additional functional criteria and insurance authorization.    Recommendations  Diet recommendations: Regular;Thin liquid Liquids provided via: Straw Medication Administration: Whole meds with puree Supervision: Staff to assist with self feeding;Intermittent supervision to cue for compensatory strategies Compensations: Minimize environmental distractions Postural Changes and/or Swallow Maneuvers: Seated upright 90 degrees;Upright 30-60 min after meal                Oral Care Recommendations: Oral care BID Follow Up Recommendations: No SLP  follow up Assistance recommended at discharge: Frequent or constant Supervision/Assistance SLP Visit Diagnosis: Dysphagia, unspecified (R13.10) Plan: All goals met         [name] Sharron L. Tivis Ringer, MA CCC/SLP Clinical Specialist - Acute Care SLP Acute Rehabilitation Services Office number [phone]   [name]  06/13/2022, 4:06 PM

## 2022-06-14 ENCOUNTER — Inpatient Hospital Stay (HOSPITAL_COMMUNITY): Payer: Medicare Other

## 2022-06-14 DIAGNOSIS — K5903 Drug induced constipation: Secondary | ICD-10-CM

## 2022-06-14 DIAGNOSIS — R52 Pain, unspecified: Secondary | ICD-10-CM

## 2022-06-14 LAB — CBC
HCT: 34 % — ABNORMAL LOW (ref 39.0–52.0)
Hemoglobin: 11.5 g/dL — ABNORMAL LOW (ref 13.0–17.0)
MCH: 27.8 pg (ref 26.0–34.0)
MCHC: 33.8 g/dL (ref 30.0–36.0)
MCV: 82.3 fL (ref 80.0–100.0)
Platelets: 169 10*3/uL (ref 150–400)
RBC: 4.13 MIL/uL — ABNORMAL LOW (ref 4.22–5.81)
RDW: 14.6 % (ref 11.5–15.5)
WBC: 8.1 10*3/uL (ref 4.0–10.5)
nRBC: 0 % (ref 0.0–0.2)

## 2022-06-14 LAB — GLUCOSE, CAPILLARY
Glucose-Capillary: 166 mg/dL — ABNORMAL HIGH (ref 70–99)
Glucose-Capillary: 198 mg/dL — ABNORMAL HIGH (ref 70–99)
Glucose-Capillary: 230 mg/dL — ABNORMAL HIGH (ref 70–99)
Glucose-Capillary: 195 mg/dL — ABNORMAL HIGH (ref 70–99)

## 2022-06-14 LAB — CK: Total CK: 13 U/L — ABNORMAL LOW (ref 49–397)

## 2022-06-14 MED ORDER — SODIUM CHLORIDE 0.9% FLUSH: 10.0000 mL | INTRAVENOUS | Status: DC | PRN

## 2022-06-14 MED ORDER — POLYETHYLENE GLYCOL 3350 17 G PO PACK: 17.0000 g | PACK | Freq: Every day | ORAL | Status: DC | PRN

## 2022-06-14 MED ORDER — SENNOSIDES-DOCUSATE SODIUM 8.6-50 MG PO TABS
2.0000 | ORAL_TABLET | Freq: Every day | ORAL | Status: DC
  Administered 2022-06-14: 2 via ORAL
  Filled 2022-06-14: qty 2

## 2022-06-14 MED ORDER — SODIUM CHLORIDE 0.9% FLUSH
10.0000 mL | Freq: Two times a day (BID) | INTRAVENOUS | Status: DC
  Administered 2022-06-14: 10 mL

## 2022-06-14 MED ORDER — SODIUM CHLORIDE 0.9% FLUSH
10.0000 mL | Freq: Two times a day (BID) | INTRAVENOUS | Status: DC
  Administered 2022-06-14 – 2022-06-15 (×2): 10 mL

## 2022-06-14 MED ORDER — CHLORHEXIDINE GLUCONATE CLOTH 2 % EX PADS
6.0000 | MEDICATED_PAD | Freq: Every day | CUTANEOUS | Status: DC
Start: 2022-06-14 — End: 2022-06-15
  Administered 2022-06-14 – 2022-06-15 (×2): 6 via TOPICAL

## 2022-06-14 NOTE — Progress Notes (Signed)
Progress Note   Patient: [name]:[id number] DOB: Feb 01, 1935 DOA: 06/08/2022     6 DOS: the patient was seen and examined on 06/14/2022   Brief hospital course: 87 year old man lives at home with his wife, PMH right AKA, wheelchair dependent, mentally sharp, presented with sudden onset of confusion and worsening low back pain.  Work-up revealed acute discitis and osteomyelitis of the thoracic and lumbar spine.  Treated with empiric antibiotics, and evaluated/discussed with neurosurgery, neurology, infectious disease and palliative medicine.  Mental status spontaneously improved, unclear etiology.  EEG work-up was unremarkable.  Neurology signed off.  Neurosurgery recommendations as below as well as infectious disease.  Patient seems to have a mild delirium which has recurred and may have modest early dementia.  Plan to observe overnight, if mental status remains stable can transfer to [facility] 7/26.  Assessment and Plan: * Acute discitis/osteomyelitis of thoracic/lumbar region of spine. No MRI secondary to pacemaker.  CT showed acute discitis/osteomyelitis at T11-12 with significant endplate destruction and retropulsion of bony fragment into ventral spinal canal. Mild discitis/OM suspected at L2-3 level.  However he has a history of back abnormalities on imaging and infection is somewhat less than definite. --will treat 6 weeks with vanc and ceftriaxone (on discharge) per ID.  --TLSO and can mobilize.  He is not a candidate for any intervention at this point.  However if he substantially improves, he can follow-up with Dr. Christella Noa as an outpatient for consideration of surgical intervention. --Palliative care also involved.   Acute metabolic encephalopathy -- Bizarre affect on admission, resolved.  Discussed with neurology, etiology unclear, possibly related to pain.  Spot EEG unremarkable, long-term video EEG unremarkable. -- No signs or symptoms of infection other than in the back. -- CT head  no acute abnormalities.  MRI not obtainable secondary to pacemaker. -- now with mild delirium although he is oriented to place, month, year   AKI (acute kidney injury) ([place]) superimposed on CKD stage IIIb --Baseline creatinine appears around 1.4-1.5 --appears stable now   Chronic diastolic CHF (congestive heart failure) (HCC) --LVEF 18-84%, diastolic function could not be determined.  --no obvious valve abnormalities   Atrial fibrillation ([place]) --CHA2DS2-VASC 7; resume apixaban   --Rate control with metoprolol. .   Diabetes mellitus (HCC) --A1C 6.4 --CBG stable. Continue SSI. Has not needed long-acting insulin at home.   Essential hypertension --monitor   GERD --Continue PPI    ESOPHAGEAL STRICTURE --History of esophageal stricture and dysphagia. Can't exclude distal esophageal stricture on CT; consider outpatient GI evaluation.       Subjective:  Feels ok Daughter notes confusion last night   Physical Exam: Vitals:   06/14/22 0017 06/14/22 0620 06/14/22 0729 06/14/22 1205  BP: (!) 172/74 (!) 176/64 (!) 164/67 134/64  Pulse: 76 74 67 65  Resp: '14 16 17 20  '$ Temp: 97.8 F (36.6 C) 98.1 F (36.7 C) (!) 97.4 F (36.3 C) 98.9 F (37.2 C)  TempSrc: Oral  Axillary Oral  SpO2: 100% 100% 100% 100%  Weight:      Height:       Physical Exam Vitals reviewed.  Constitutional:      General: He is not in acute distress.    Appearance: He is not ill-appearing or toxic-appearing.  Cardiovascular:     Rate and Rhythm: Normal rate. Rhythm irregular.     Heart sounds: No murmur heard.    Comments: Telemetry afib Pulmonary:     Effort: Pulmonary effort is normal.  Musculoskeletal:  Right lower leg: No edema (aka).     Left lower leg: No edema.  Neurological:     General: No focal deficit present.     Mental Status: He is alert.     Cranial Nerves: No cranial nerve deficit.  Psychiatric:        Mood and Affect: Mood normal.     Comments: A bit confused     Data  Reviewed:  CBG stable Hgb stable 11.5  Family Communication: daughter Vicente Males at bedside  Disposition: Status is: Inpatient Remains inpatient appropriate because: confusion, IV abx  Planned Discharge Destination: Skilled nursing facility    Time spent: 35 minutes  Author: Murray Hodgkins, MD 06/14/2022 12:22 PM  For on call review [email or web address].

## 2022-06-14 NOTE — Progress Notes (Signed)
Peripherally Inserted Central Catheter Placement  The IV Nurse has discussed with the patient and/or persons authorized to consent for the patient, the purpose of this procedure and the potential benefits and risks involved with this procedure.  The benefits include less needle sticks, lab draws from the catheter, and the patient may be discharged home with the catheter. Risks include, but not limited to, infection, bleeding, blood clot (thrombus formation), and puncture of an artery; nerve damage and irregular heartbeat and possibility to perform a PICC exchange if needed/ordered by physician.  Alternatives to this procedure were also discussed.  Bard Power PICC patient education guide, fact sheet on infection prevention and patient information card has been provided to patient /or left at bedside.    PICC Placement Documentation  PICC Single Lumen 06/14/22 Right Brachial 37 cm 0 cm (Active)  Indication for Insertion or Continuance of Line Home intravenous therapies (PICC only) 06/14/22 1600  Exposed Catheter (cm) 0 cm 06/14/22 1600  Site Assessment Clean, Dry, Intact 06/14/22 1600  Line Status Flushed;Saline locked;Blood return noted 06/14/22 1600  Dressing Type Transparent;Securing device 06/14/22 1600  Dressing Status Antimicrobial disc in place;Clean, Dry, Intact 06/14/22 1600  Safety Lock Not Applicable 54/00/86 7619  Line Care Connections checked and tightened 06/14/22 1600  Dressing Intervention New dressing 06/14/22 1600  Dressing Change Due 06/21/22 06/14/22 1600       [name] 06/14/2022, 4:58 PM

## 2022-06-14 NOTE — Progress Notes (Signed)
PT Cancellation Note  Patient Details Name: [name] MRN: [id number] DOB: [birth year]   Cancelled Treatment:    Reason Eval/Treat Not Completed: (P) Other (comment) (Pt eating at this time and requested PTA return at a later date.  Will f/u per POC.)   [name] [name] 06/14/2022, 2:37 PM  Erasmo Leventhal , PTA Acute Rehabilitation Services Office [phone]

## 2022-06-14 NOTE — Progress Notes (Signed)
Daily Progress Note   Patient Name: [name]       Date: 06/14/2022 DOB: [birth year]  Age: 87 y.o. MRN#: [id number] Attending Physician: Samuella Cota, MD Primary Care Physician: Seward Carol, MD Admit Date: 06/08/2022  Reason for Consultation/Follow-up: Establishing goals of care  Subjective: Chart review performed. Received report from primary RN - no acute concerns.  Went to visit patient at bedside - daughter/[name] and friend/[name] present. [name], alert, oriented, and able to participate in conversation. No signs or non-verbal gestures of pain or discomfort noted. No respiratory distress, increased work of breathing, or secretions noted. Patient tells me his current oxycodone regimen is managing his pain well. Patient also tells me the Baclofen is helping his symptoms.  Discussed that the goal of rehab is improvement/stabalization of functional status,  which can be a difficult goal to meet for patients with advanced illness and multiple medical conditions. Reviewed what is needed for someone to have a positive rehabilitation experience to include adequate nutritional intake as well as willingness/ability to participate. Encouraged patient/family to pre-medicate if needed prior to working with PT/OT to optimize pain prior to therapy. Patient is very motivated to participate in rehab - family have chosen [facility].  Patient had a bowel movement yesterday - education provided on stool softener and consistent bowel movements in context of opioid use.   All questions and concerns addressed. Encouraged to call with questions and/or concerns. PMT card previously provided.   5:00 PM Notified patient's wife was present at bedside and requesting to speak with me.  Patient was having PICC line placed - met with patient's wife/[name] in 3W conference room. Emotional support provided. [name] expresses happiness that patient has improved, but she does understand his road to recovery is long. Short term goal is for patient discharge to rehab, if he improves enough, wife hopes he can come back home; however, she is realistic in understanding this may not happen and transition to long term care would be best for him. She is not able to care for him at home if he does not show improvement. She is also agreeable for outpatient Palliative Care to follow patient/family for continued support pending his clinical course at rehab. All questions answered, encouraged to call with questions/concerns.   Length of Stay: 6  Current  Medications: Scheduled Meds:   apixaban  2.5 mg Oral BID   baclofen  5 mg Oral Q8H   cyanocobalamin  1,000 mcg Intramuscular Daily   diltiazem  180 mg Oral Daily   hydrocerin   Topical BID   insulin aspart  0-9 Units Subcutaneous TID WC   pantoprazole  40 mg Oral Daily   polyethylene glycol  17 g Oral Daily    Continuous Infusions:  cefTRIAXone (ROCEPHIN)  IV 2 g (06/14/22 0909)   DAPTOmycin (CUBICIN) 500 mg in sodium chloride 0.9 % IVPB Stopped (06/13/22 2131)    PRN Meds: acetaminophen **OR** acetaminophen, HYDROmorphone (DILAUDID) injection, metoprolol tartrate, mouth rinse, oxyCODONE  Physical Exam Vitals and nursing note reviewed.  Constitutional:      General: He is not in acute distress.    Appearance: He is ill-appearing.  Pulmonary:     Effort: No respiratory distress.  Skin:    General: Skin is warm and dry.  Neurological:     Mental Status: He is alert and oriented to person, place, and time.     Motor: Weakness present.  Psychiatric:        Attention and Perception: Attention normal.        Behavior: Behavior is cooperative.        Cognition and Memory: Cognition and memory normal.             Vital Signs: BP 134/64 (BP  Location: Right Arm)   Pulse 65   Temp 98.9 F (37.2 C) (Oral)   Resp 20   Ht 5' 10"  (1.778 m)   Wt 61.5 kg   SpO2 100%   BMI 19.45 kg/m  SpO2: SpO2: 100 % O2 Device: O2 Device: Room Air O2 Flow Rate: O2 Flow Rate (L/min): 2 L/min  Intake/output summary:  Intake/Output Summary (Last 24 hours) at 06/14/2022 1316 Last data filed at 06/14/2022 0656 Gross per 24 hour  Intake 300 ml  Output 1300 ml  Net -1000 ml   LBM: Last BM Date : 06/13/22 Baseline Weight: Weight: 72.6 kg Most recent weight: Weight: 61.5 kg       Palliative Assessment/Data: PPS 30%       Patient Active Problem List   Diagnosis Date Noted   Pressure injury of skin 06/12/2022   Acute discitis/osteomyelitis of thoracic/lumbar region of spine  85/88/5027   Acute metabolic encephalopathy 74/10/8785   Sepsis ([place]) 03/23/2017   AKI (acute kidney injury) ([place]) superimposed on CKD stage IIIb 03/23/2017   Leukocytosis    Cellulitis 03/22/2017   Diabetic foot ulcer associated with type 2 diabetes mellitus ([place]) 11/24/2014   Squamous cell carcinoma of scalp and skin of neck 05/29/2014   Unilateral AKA, right ([place]) 04/08/2014   PAD (peripheral artery disease) ([place]) 04/08/2014   Gangrene associated with type 2 diabetes mellitus ([place]) 04/02/2014   Generalized weakness 03/23/2014   Wound dehiscence 03/15/2014   Coagulopathy ([place]) 03/09/2014   Anemia due to acute blood loss 03/08/2014   Diabetes mellitus ([place]) 03/02/2014   Anemia 03/01/2014   Constipation 02/18/2014   Insomnia 02/18/2014   Amputation stump infection ([place]) 02/06/2014   Hx of right BKA ([place]) 01/28/2014   Osteomyelitis ([place]) 01/16/2014   Diabetic osteomyelitis ([place]) 01/09/2014   Pacemaker- [name] 02/20/2013   Hyperlipidemia 12/12/2012   TIA (transient ischemic attack) 12/11/2012   ETOH abuse 04/02/2012   ACTINIC KERATOSIS 11/29/2010   MULTIPLE&UNSPEC OPEN WOUND LOWER LIMB COMP 10/19/2010   ADHESIVE CAPSULITIS OF SHOULDER 08/30/2010    ESOPHAGEAL  STRICTURE 05/19/2010   DYSPHAGIA UNSPECIFIED 04/08/2010   DUPUYTREN'S CONTRACTURE, LEFT 12/21/2009   CERVICAL STRAIN, ACUTE 08/28/2009   OVERWEIGHT/OBESITY 04/07/2009   RBBB 04/07/2009   GERD 04/07/2009   SYNCOPE AND COLLAPSE 04/07/2009   FATIGUE / MALAISE 04/07/2009   PHLEBITIS&THROMBOPHLEB SUP VESSELS LOWER EXTREM 02/28/2008   CELLULITIS/ABSCESS, LEG 02/28/2008   BACK PAIN, CHRONIC 10/31/2007   Unspecified venous (peripheral) insufficiency 07/02/2007   GOUT 06/26/2007   Essential hypertension 06/26/2007   Atrial fibrillation ([place]) 06/26/2007   Chronic diastolic CHF (congestive heart failure) ([place]) 06/26/2007    Palliative Care Assessment & Plan   Patient Profile: 87 y.o. male  with past medical history of HTN, PAF on eliquis, F6CL, diastolic CHF, SSS s/p St. Jude PPM in 06/2011, GERD/esophgaeal stricture presented to ED on 06/08/22 from home with AMS. Patient was admitted on 06/08/2022 with acute discitis/osteomylitis of the thoracic/lumbar region of spine, acute encephalopathy, AKI on CKD 3b.    Assessment: Principal Problem:   Acute discitis/osteomyelitis of thoracic/lumbar region of spine  Active Problems:   Essential hypertension   Atrial fibrillation (HCC)   Chronic diastolic CHF (congestive heart failure) (HCC)   ESOPHAGEAL STRICTURE   GERD   Hyperlipidemia   Diabetes mellitus ([place])   AKI (acute kidney injury) ([place]) superimposed on CKD stage IIIb   Acute metabolic encephalopathy   Pressure injury of skin   Recommendations/Plan: Continue current medical treatment Continue DNR/DNI as previously documented Short term goal is discharge to rehab. Pending clinical course at rehab patient and family are hopeful he can return home; however, understand he may need transition to LTC if no significant improvement. Outpatient Palliative Care to follow and continue [place] discussions Started senna-docusate 2 tabs at bedtime Changed miralax to daily PRN mild constipation   PMT will continue to follow peripherally. If there are any imminent needs please call the service directly  Goals of Care and Additional Recommendations: Limitations on Scope of Treatment: Full Scope Treatment  Code Status:    Code Status Orders  (From admission, onward)           Start     Ordered   06/09/22 1104  Do not attempt resuscitation (DNR)  Continuous       Question Answer Comment  In the event of cardiac or respiratory ARREST Do not call a "code blue"   In the event of cardiac or respiratory ARREST Do not perform Intubation, CPR, defibrillation or ACLS   In the event of cardiac or respiratory ARREST Use medication by any route, position, wound care, and other measures to relive pain and suffering. May use oxygen, suction and manual treatment of airway obstruction as needed for comfort.      06/09/22 1103           Code Status History     Date Active Date Inactive Code Status Order ID Comments User Context   06/08/2022 1447 06/09/2022 1103 Full Code [id number]  Orma Flaming, MD ED   03/23/2017 0117 03/25/2017 2219 Full Code [id number]  Karmen Bongo, MD Inpatient   04/02/2014 1516 04/05/2014 1811 Full Code [id number]  [name], MD Inpatient   03/18/2014 2112 03/20/2014 1937 Full Code [id number]  [name], MD Inpatient   03/15/2014 2233 03/18/2014 2112 Full Code [id number]  Berle Mull, MD ED   03/09/2014 0003 03/11/2014 1819 Full Code [id number]  Theressa Millard, MD Inpatient   03/08/2014 2356 03/09/2014 0003 DNR [id number]  Theressa Millard, MD Inpatient   03/08/2014 2345 03/08/2014 2356  Full Code [id number]  Theressa Millard, MD ED   03/02/2014 0335 03/03/2014 1800 DNR [id number]  Berle Mull, MD Inpatient   03/01/2014 2353 03/02/2014 0335 Full Code [id number]  Berle Mull, MD Inpatient   01/28/2014 1440 03/01/2014 2353 DNR [id number] No cpr, defibrillation, intubation or mechanical ventilation [name], DO Outpatient   01/20/2014 1038 01/24/2014 1837 Full  Code [id number]  [name], MD Inpatient   01/17/2014 1416 01/20/2014 1038 Full Code [id number]  Theodoro Kos, DO Inpatient   01/16/2014 2008 01/17/2014 1416 Full Code [id number]  Theodis Blaze, MD Inpatient   12/11/2012 2117 12/12/2012 1826 Full Code [id number]  Ernestine Conrad, RN Inpatient       Prognosis:  Unable to determine  Discharge Planning: [place] for rehab with Palliative care service follow-up  Care plan was discussed with primary RN, patient, patient's wife, patient's daughter, [facility], attending  Thank you for allowing the Palliative Medicine Team to assist in the care of this patient.   Total Time 90 minutes Prolonged Time Billed  yes       Greater than 50%  of this time was spent counseling and coordinating care related to the above assessment and plan.  Lin Landsman, NP  Please contact Palliative Medicine Team phone at [phone] for questions and concerns.   *Portions of this note are a verbal dictation therefore any spelling and/or grammatical errors are due to the "[employer]" system interpretation.

## 2022-06-15 DIAGNOSIS — M2550 Pain in unspecified joint: Secondary | ICD-10-CM | POA: Diagnosis not present

## 2022-06-15 DIAGNOSIS — G9341 Metabolic encephalopathy: Secondary | ICD-10-CM | POA: Diagnosis not present

## 2022-06-15 DIAGNOSIS — C44222 Squamous cell carcinoma of skin of right ear and external auricular canal: Secondary | ICD-10-CM | POA: Diagnosis not present

## 2022-06-15 DIAGNOSIS — E089 Diabetes mellitus due to underlying condition without complications: Secondary | ICD-10-CM | POA: Diagnosis not present

## 2022-06-15 DIAGNOSIS — Z86010 Personal history of colonic polyps: Secondary | ICD-10-CM | POA: Diagnosis not present

## 2022-06-15 DIAGNOSIS — E119 Type 2 diabetes mellitus without complications: Secondary | ICD-10-CM | POA: Diagnosis not present

## 2022-06-15 DIAGNOSIS — Z452 Encounter for adjustment and management of vascular access device: Secondary | ICD-10-CM | POA: Diagnosis not present

## 2022-06-15 DIAGNOSIS — M25552 Pain in left hip: Secondary | ICD-10-CM | POA: Diagnosis not present

## 2022-06-15 DIAGNOSIS — Z87891 Personal history of nicotine dependence: Secondary | ICD-10-CM | POA: Diagnosis not present

## 2022-06-15 DIAGNOSIS — M4646 Discitis, unspecified, lumbar region: Secondary | ICD-10-CM | POA: Diagnosis not present

## 2022-06-15 DIAGNOSIS — E162 Hypoglycemia, unspecified: Secondary | ICD-10-CM | POA: Diagnosis not present

## 2022-06-15 DIAGNOSIS — R1311 Dysphagia, oral phase: Secondary | ICD-10-CM | POA: Diagnosis not present

## 2022-06-15 DIAGNOSIS — I480 Paroxysmal atrial fibrillation: Secondary | ICD-10-CM | POA: Diagnosis not present

## 2022-06-15 DIAGNOSIS — K5900 Constipation, unspecified: Secondary | ICD-10-CM | POA: Diagnosis not present

## 2022-06-15 DIAGNOSIS — Z89511 Acquired absence of right leg below knee: Secondary | ICD-10-CM | POA: Diagnosis not present

## 2022-06-15 DIAGNOSIS — Z510 Encounter for antineoplastic radiation therapy: Secondary | ICD-10-CM | POA: Diagnosis not present

## 2022-06-15 DIAGNOSIS — E785 Hyperlipidemia, unspecified: Secondary | ICD-10-CM | POA: Diagnosis not present

## 2022-06-15 DIAGNOSIS — I69391 Dysphagia following cerebral infarction: Secondary | ICD-10-CM | POA: Diagnosis not present

## 2022-06-15 DIAGNOSIS — I5032 Chronic diastolic (congestive) heart failure: Secondary | ICD-10-CM | POA: Diagnosis not present

## 2022-06-15 DIAGNOSIS — L97521 Non-pressure chronic ulcer of other part of left foot limited to breakdown of skin: Secondary | ICD-10-CM | POA: Diagnosis not present

## 2022-06-15 DIAGNOSIS — M6281 Muscle weakness (generalized): Secondary | ICD-10-CM | POA: Diagnosis not present

## 2022-06-15 DIAGNOSIS — D649 Anemia, unspecified: Secondary | ICD-10-CM | POA: Diagnosis not present

## 2022-06-15 DIAGNOSIS — M25551 Pain in right hip: Secondary | ICD-10-CM | POA: Diagnosis not present

## 2022-06-15 DIAGNOSIS — I10 Essential (primary) hypertension: Secondary | ICD-10-CM | POA: Diagnosis not present

## 2022-06-15 DIAGNOSIS — L89153 Pressure ulcer of sacral region, stage 3: Secondary | ICD-10-CM | POA: Diagnosis not present

## 2022-06-15 DIAGNOSIS — L570 Actinic keratosis: Secondary | ICD-10-CM | POA: Diagnosis not present

## 2022-06-15 DIAGNOSIS — I509 Heart failure, unspecified: Secondary | ICD-10-CM | POA: Diagnosis not present

## 2022-06-15 DIAGNOSIS — I700 Atherosclerosis of aorta: Secondary | ICD-10-CM | POA: Diagnosis not present

## 2022-06-15 DIAGNOSIS — Z741 Need for assistance with personal care: Secondary | ICD-10-CM | POA: Diagnosis not present

## 2022-06-15 DIAGNOSIS — K219 Gastro-esophageal reflux disease without esophagitis: Secondary | ICD-10-CM | POA: Diagnosis not present

## 2022-06-15 DIAGNOSIS — M720 Palmar fascial fibromatosis [Dupuytren]: Secondary | ICD-10-CM | POA: Diagnosis not present

## 2022-06-15 DIAGNOSIS — Z79899 Other long term (current) drug therapy: Secondary | ICD-10-CM | POA: Diagnosis not present

## 2022-06-15 DIAGNOSIS — Z950 Presence of cardiac pacemaker: Secondary | ICD-10-CM | POA: Diagnosis not present

## 2022-06-15 DIAGNOSIS — I4821 Permanent atrial fibrillation: Secondary | ICD-10-CM | POA: Diagnosis not present

## 2022-06-15 DIAGNOSIS — D72829 Elevated white blood cell count, unspecified: Secondary | ICD-10-CM | POA: Diagnosis not present

## 2022-06-15 DIAGNOSIS — M869 Osteomyelitis, unspecified: Secondary | ICD-10-CM | POA: Diagnosis not present

## 2022-06-15 DIAGNOSIS — Z7401 Bed confinement status: Secondary | ICD-10-CM | POA: Diagnosis not present

## 2022-06-15 DIAGNOSIS — G4700 Insomnia, unspecified: Secondary | ICD-10-CM | POA: Diagnosis not present

## 2022-06-15 DIAGNOSIS — N179 Acute kidney failure, unspecified: Secondary | ICD-10-CM | POA: Diagnosis not present

## 2022-06-15 DIAGNOSIS — L97522 Non-pressure chronic ulcer of other part of left foot with fat layer exposed: Secondary | ICD-10-CM | POA: Diagnosis not present

## 2022-06-15 DIAGNOSIS — N200 Calculus of kidney: Secondary | ICD-10-CM | POA: Diagnosis not present

## 2022-06-15 DIAGNOSIS — E1169 Type 2 diabetes mellitus with other specified complication: Secondary | ICD-10-CM | POA: Diagnosis not present

## 2022-06-15 DIAGNOSIS — I4891 Unspecified atrial fibrillation: Secondary | ICD-10-CM | POA: Diagnosis not present

## 2022-06-15 DIAGNOSIS — Z7901 Long term (current) use of anticoagulants: Secondary | ICD-10-CM | POA: Diagnosis not present

## 2022-06-15 DIAGNOSIS — R5383 Other fatigue: Secondary | ICD-10-CM | POA: Diagnosis not present

## 2022-06-15 LAB — GLUCOSE, CAPILLARY
Glucose-Capillary: 188 mg/dL — ABNORMAL HIGH (ref 70–99)
Glucose-Capillary: 261 mg/dL — ABNORMAL HIGH (ref 70–99)

## 2022-06-15 LAB — CBC
HCT: 31.5 % — ABNORMAL LOW (ref 39.0–52.0)
Hemoglobin: 10.6 g/dL — ABNORMAL LOW (ref 13.0–17.0)
MCH: 27.8 pg (ref 26.0–34.0)
MCHC: 33.7 g/dL (ref 30.0–36.0)
MCV: 82.7 fL (ref 80.0–100.0)
Platelets: 164 10*3/uL (ref 150–400)
RBC: 3.81 MIL/uL — ABNORMAL LOW (ref 4.22–5.81)
RDW: 14.7 % (ref 11.5–15.5)
WBC: 7 10*3/uL (ref 4.0–10.5)
nRBC: 0 % (ref 0.0–0.2)

## 2022-06-15 MED ORDER — DAPTOMYCIN IV (FOR PTA / DISCHARGE USE ONLY): 500.0000 mg | INTRAVENOUS | 0 refills | Status: AC

## 2022-06-15 MED ORDER — CEFTRIAXONE IV (FOR PTA / DISCHARGE USE ONLY): 2.0000 g | INTRAVENOUS | 0 refills | Status: AC

## 2022-06-15 MED ORDER — INSULIN LISPRO 100 UNIT/ML (KWIKPEN): 5.0000 [IU] | PEN_INJECTOR | Freq: Three times a day (TID) | SUBCUTANEOUS | 11 refills | Status: DC

## 2022-06-15 MED ORDER — FUROSEMIDE 20 MG PO TABS
20.0000 mg | ORAL_TABLET | ORAL | Status: DC
  Administered 2022-06-15: 20 mg via ORAL
  Filled 2022-06-15: qty 1

## 2022-06-15 MED ORDER — OXYCODONE HCL 10 MG PO TABS: 10.0000 mg | ORAL_TABLET | Freq: Three times a day (TID) | ORAL | 0 refills | Status: DC | PRN

## 2022-06-15 MED ORDER — HYDRALAZINE HCL 20 MG/ML IJ SOLN
5.0000 mg | Freq: Once | INTRAMUSCULAR | Status: AC
  Administered 2022-06-15: 5 mg via INTRAVENOUS
  Filled 2022-06-15: qty 1

## 2022-06-15 NOTE — TOC Transition Note (Signed)
Transition of Care [facility]) - CM/SW Discharge Note   Patient Details  Name: [name] MRN: [id number] Date of Birth: 04-Jun-1935  [facility] CM/SW Contact:  Geralynn Ochs, LCSW Phone Number: 06/15/2022, 1:45 PM   Clinical Narrative:   CSW noting that patient is medically stable for discharge to SNF today. CSW sent discharge information to [facility] and confirmed readiness to accept patient. CSW updated patient's wife, [name], via phone, who is in agreement to transfer today. Transport arranged with [facility] for next available.  Nurse to call report to [phone].    Final next level of care: Skilled Nursing Facility Barriers to Discharge: Barriers Resolved   Patient Goals and CMS Choice     Choice offered to / list presented to : Spouse  Discharge Placement              Patient [facility] at: [facility] Patient to be transferred to facility by: [place] Name of family member notified: Ann Patient and family notified of of transfer: 06/15/22  Discharge Plan and Services                                     Social Determinants of Health (SDOH) Interventions     Readmission Risk Interventions     No data to display

## 2022-06-15 NOTE — TOC Progression Note (Signed)
Transition of Care (TOC) - Progression Note    Patient Details  Name: [name] MRN: [id number] Date of Birth: 05/17/1935  Transition of Care (TOC) CM/SW Contact  Elizabeth M Paisley, LCSW Phone Number: 06/15/2022, 1:43 PM  Clinical Narrative:   CSW met with daughter, Anna, earlier today at bedside to discuss discharge plans. Per Anna, her mother is getting overwhelmed and confused and wanted to help ensure everything is in place for her dad. CSW reiterated to Anna bed offers, and patient asked Anna to go and look at them to make a final decision. Anna went to tour and got back to CSW that they'd like to change the SNF and go with [facility] instead. CSW confirmed with [facility] that they had a bed available, and also requested new insurance authorization. Authorization was approved. Patient can admit to [facility] tomorrow, pending medical stability. CSW updated family at bedside.    Expected Discharge Plan: Skilled Nursing Facility Barriers to Discharge: Continued Medical Work up  Expected Discharge Plan and Services Expected Discharge Plan: Skilled Nursing Facility       Living arrangements for the past 2 months: Apartment Expected Discharge Date: 06/15/22                                     Social Determinants of Health (SDOH) Interventions    Readmission Risk Interventions     No data to display          

## 2022-06-15 NOTE — Discharge Summary (Addendum)
Physician Discharge Summary  ATHENS LEBEAU UJW:[id number] DOB: 10-18-1935 DOA: 06/08/2022  PCP: Seward Carol, MD  Admit date: 06/08/2022 Discharge date: 06/15/2022  Admitted From: SNF Disposition:  SNF  Recommendations for Outpatient Follow-up:  Follow up with PCP in 1-2 weeks Please obtain BMP/CBC in one week PICC line inserted on 06/14/2022 and date of antibiotics will be 830 2023 can remove PICC line at that time.   Home Health:No Equipment/Devices:none  Discharge Condition:Stable CODE STATUS:DNR Diet recommendation: Heart Healthy  Brief/Interim Summary: 87 year old man lives at home with his wife, PMH right AKA, wheelchair dependent, mentally sharp, presented with sudden onset of confusion and worsening low back pain.  Work-up revealed acute discitis and osteomyelitis of the thoracic and lumbar spine.  Treated with empiric antibiotics, and evaluated/discussed with neurosurgery, neurology, infectious disease and palliative medicine.  Mental status spontaneously improved, unclear etiology.  EEG work-up was unremarkable.  Neurology signed off.  Neurosurgery recommendations as below as well as infectious disease.  Patient seems to have a mild delirium which has recurred and may have modest early dementia.   Discharge Diagnoses:  Principal Problem:   Acute discitis/osteomyelitis of thoracic/lumbar region of spine  Active Problems:   Acute metabolic encephalopathy   AKI (acute kidney injury) ([place]) superimposed on CKD stage IIIb   Chronic diastolic CHF (congestive heart failure) (HCC)   Atrial fibrillation (HCC)   Diabetes mellitus ([place])   Essential hypertension   GERD   ESOPHAGEAL STRICTURE   Hyperlipidemia   Pressure injury of skin  Acute discitis/osteomyelitis of the thoracic lumbar region: No MRI could be done due to pacemaker. CT showed discitis and osteomyelitis. Infectious disease was consulted recommended daptomycin and Rocephin for 6 weeks. PICC line inserted on  06/14/2022. PICC line to be removed at the end of treatment. Obtain CBCs and basic metabolic panel once a week.  Acute metabolic encephalopathy: Bizarre behavior on admission now resolved discussed with neurology no clear etiology. Long-term video EEG unremarkable CT of the head showed no acute abnormalities no further work-up.  Acute kidney injury superimposed on chronic kidney disease stage IIIb: Likely prerenal azotemia resolved with resuscitation.  Chronic diastolic heart failure: With an EF of 60% no change made to his medication continue current regimen.  Chronic atrial fibrillation: With a chads Vascor of 7, rate controlled continue metoprolol and apixaban.  Diabetes mellitus type 2 without complications: With an A1c of 6.4 no changes made to his medication.  Essential hypertension: Continue current home regimen no changes made.  Hypokalemia: Repleted orally.  Question esophageal stricture: CT showed questionable esophageal stricture patient swallowing without any difficulty history. We will need a GI evaluation as an outpatient.  Stage 2 Heel ulcer present on admission:   Discharge Instructions  Discharge Instructions     Diet - low sodium heart healthy   Complete by: As directed    Home infusion instructions   Complete by: As directed    Instructions: Flushing of vascular access device: 0.9% NaCl pre/post medication administration and prn patency; Heparin 100 u/ml, 39m for implanted ports and Heparin 10u/ml, 514mfor all other central venous catheters.   Increase activity slowly   Complete by: As directed    No wound care   Complete by: As directed       Allergies as of 06/15/2022       Reactions   Morphine Rash, Other (See Comments)   Irritability also   Statins Other (See Comments)   dizzy        Medication  List     STOP taking these medications    LANTUS SOLOSTAR [place]       TAKE these medications    Accu-Chek Aviva Soln Use as Directed.  Dx E11.9   Accu-Chek Softclix Lancets lancets Use to check blood sugar three times daily. Dx E11.9   apixaban 5 MG Tabs tablet Commonly known as: ELIQUIS Take 5 mg by mouth 2 (two) times daily.   B-D ULTRAFINE III SHORT PEN 31G X 8 MM Misc Generic drug: Insulin Pen Needle USE AS DIRECTED   baclofen 10 MG tablet Commonly known as: LIORESAL Take 10 mg by mouth every 8 (eight) hours.   cefTRIAXone  IVPB Commonly known as: ROCEPHIN Inject 2 g into the vein daily. Indication:  Possible discitis/osteomyelitis  First Dose: Yes Last Day of Therapy:  07/20/22  Labs - Once weekly:  CBC/D and BMP, Labs - Every other week:  ESR and CRP   daptomycin  IVPB Commonly known as: CUBICIN Inject 500 mg into the vein every other day. Indication:  Possible discitis/osteomyelitis  First Dose: Yes Last Day of Therapy:  07/20/22  Labs - Once weekly:  CBC/D, BMP, and CPK Labs - Every other week:  ESR and CRP   diltiazem 180 MG 24 hr capsule Commonly known as: CARDIZEM CD Take 180 mg by mouth daily.   furosemide 20 MG tablet Commonly known as: LASIX Take 20 mg by mouth every other day.   gabapentin 100 MG capsule Commonly known as: NEURONTIN Take 100 mg by mouth 2 (two) times daily.   glucose blood test strip Commonly known as: Accu-Chek Aviva Plus DX E11.9, check blood sugar three times daily   insulin lispro 100 UNIT/ML KiwkPen Commonly known as: HUMALOG Inject 0.05 mLs (5 Units total) into the skin 3 (three) times daily before meals. What changed:  how much to take when to take this reasons to take this   melatonin 5 MG Tabs Take 5 mg by mouth at bedtime.   MULTIVITAMIN PO Take 1 tablet by mouth daily. Takes a Men's 50 Plus Multivitamin   omeprazole 20 MG capsule Commonly known as: PRILOSEC Take 20 mg by mouth daily.   Oxycodone HCl 10 MG Tabs Take 1 tablet (10 mg total) by mouth every 8 (eight) hours as needed. For pain               Home Infusion Instuctions   (From admission, onward)           Start     Ordered   06/15/22 0000  Home infusion instructions       Question:  Instructions  Answer:  Flushing of vascular access device: 0.9% NaCl pre/post medication administration and prn patency; Heparin 100 u/ml, 39m for implanted ports and Heparin 10u/ml, 572mfor all other central venous catheters.   06/15/22 1143            Contact information for after-discharge care     Destination     [facility] Preferred SNF .   Service: Skilled Nursing Contact information: [id number]. [place] [phone]                  Allergies  Allergen Reactions   Morphine Rash and Other (See Comments)    Irritability also   Statins Other (See Comments)    dizzy    Consultations: Infectious disease   Procedures/Studies: DG CHEST PORT 1 VIEW  Result Date: 06/14/2022 CLINICAL  DATA:  PICC placement EXAM: PORTABLE CHEST 1 VIEW COMPARISON:  06/14/2022 FINDINGS: Right arm PICC terminates at the cavoatrial junction. Lungs are clear.  No pleural effusion or pneumothorax. The heart is normal in size.  Left subclavian pacemaker. IMPRESSION: Right arm PICC terminates at the cavoatrial junction. Electronically Signed   By: Julian Hy M.D.   On: 06/14/2022 20:52   DG Chest Port 1 View  Result Date: 06/14/2022 CLINICAL DATA:  PICC line placement EXAM: PORTABLE CHEST 1 VIEW COMPARISON:  06/08/2022 FINDINGS: Left-sided pacing device as before. Borderline to mild cardiomegaly. No acute airspace disease. Interim placement of right upper extremity central venous catheter with tip overlying the proximal right atrial region. IMPRESSION: No active disease. Right upper extremity central venous catheter tip projects over the proximal right atrial region. Electronically Signed   By: Donavan Foil M.D.   On: 06/14/2022 17:29   [place] EKG SITE RITE  Result Date: 06/13/2022 If Site Rite image not  attached, placement could not be confirmed due to current cardiac rhythm.  [place] EKG SITE RITE  Result Date: 06/13/2022 If Site Rite image not attached, placement could not be confirmed due to current cardiac rhythm.  Overnight EEG with video  Result Date: 06/11/2022 Lora Havens, MD     06/12/2022  9:32 AM Patient Name: ESTANISLADO SURGEON MRN: [id number] Epilepsy Attending: Lora Havens Referring Physician/Provider: Samuella Cota, MD Duration: 06/10/2022 1202 to 06/11/2022 1004  Patient history: 87 year old male with altered mental status.  EEG to evaluate for seizure.  Level of alertness: Awake, asleep  AEDs during EEG study: None  Technical aspects: This EEG study was done with scalp electrodes positioned according to the 10-20 International system of electrode placement. Electrical activity was acquired at a sampling rate of _0  and reviewed with a high frequency filter of _1  and a low frequency filter of _2 . EEG data were recorded continuously and digitally stored.  Description: The posterior dominant rhythm consists of 8-9 Hz activity of moderate voltage (25-35 uV) seen predominantly in posterior head regions, symmetric and reactive to eye opening and eye closing. Sleep was characterized by vertex waves, sleep spindles (12 to 14 Hz), maximal frontocentral region. EEG showed intermittent generalized sharply contoured 3 to 6 Hz theta-delta slowing. Hyperventilation and photic stimulation were not performed.   ABNORMALITY - Intermittent slow, generalized  IMPRESSION: This study is suggestive of mild diffuse encephalopathy, nonspecific etiology. No seizures or definite epileptiform discharges were seen during this study.  Lora Havens   EEG adult  Result Date: 06/09/2022 Lora Havens, MD     06/09/2022  5:38 PM Patient Name: ROWDY GUERRINI MRN: [id number] Epilepsy Attending: Lora Havens Referring Physician/Provider: Samuella Cota, MD Date: 06/09/2022 Duration: 21.22 mins  Patient history: 87 year old male with altered mental status.  EEG to evaluate for seizure. Level of alertness: Awake AEDs during EEG study: None Technical aspects: This EEG study was done with scalp electrodes positioned according to the 10-20 International system of electrode placement. Electrical activity was acquired at a sampling rate of _3  and reviewed with a high frequency filter of _4  and a low frequency filter of _5 . EEG data were recorded continuously and digitally stored. Description: No clear posterior dominant rhythm was seen.  EEG showed continuous generalized 5 to 7 Hz theta slowing.  Intermittent generalized periodic discharges with triphasic morphology at 1.5 to 2 Hz were also noted , more frequent when awake/stimulated. Hyperventilation and photic stimulation were not performed.   ABNORMALITY -  Periodic discharges with triphasic morphology, generalized ( GPDs) - Continuous slow, generalized IMPRESSION: This study showed generalized periodic discharges with triphasic morphology at 1.5 to 2 Hz which is on the ictal-interictal continuum with increased risk for seizures.  However, the frequency, morphology and reactivity to stimulation is more commonly indicative of toxic-metabolic causes like cefepime toxicity.  Additionally there is moderate diffuse encephalopathy, nonspecific etiology.  No definite seizures were seen during this study. If concern for ictal-interictal activity persists, please consider prolonged EEG monitoring. Lora Havens   ECHOCARDIOGRAM COMPLETE  Result Date: 06/08/2022    ECHOCARDIOGRAM REPORT   Patient Name:   ETHER WOLTERS Date of Exam: 06/08/2022 Medical Rec #:  [id number]        Height:       70.0 in Accession #:    [id number]       Weight:       160.0 lb Date of Birth:  [birth year]        BSA:          1.898 m Patient Age:    81 years         BP:           214/126 mmHg Patient Gender: M                HR:           84 bpm. Exam Location:  Inpatient Procedure:  2D Echo, Color Doppler and Cardiac Doppler Indications:    "Discitis of multiple sites of the spine"  History:        Patient has prior history of Echocardiogram examinations, most                 recent 03/05/2014. CHF, Pacemaker, Arrythmias:Atrial                 Fibrillation; Risk Factors:Hypertension, Diabetes and                 Dyslipidemia.  Sonographer:    Raquel Sarna Senior RDCS Referring Phys: [id number] [address]  1. Left ventricular ejection fraction, by estimation, is 60 to 65%. The left ventricle has normal function. The left ventricle has no regional wall motion abnormalities. Diastolic function indeterminant due to AFib.  2. Right ventricular systolic function is mildly reduced. The right ventricular size is normal. There is normal pulmonary artery systolic pressure. The estimated right ventricular systolic pressure is 20.2 mmHg.  3. Left atrial size was severely dilated.  4. Right atrial size was mildly dilated.  5. The mitral valve is degenerative. Moderate mitral valve regurgitation. Severe mitral annular calcification.  6. Tricuspid valve regurgitation is moderate.  7. The aortic valve is tricuspid. There is moderate calcification of the aortic valve. There is moderate thickening of the aortic valve. Aortic valve regurgitation is trivial. Aortic valve sclerosis/calcification is present, without any evidence of aortic stenosis.  8. The inferior vena cava is normal in size with greater than 50% respiratory variability, suggesting right atrial pressure of 3 mmHg.  9. No valvular vegetations visualized, however, degree of MAC makes it difficult to fully interrogate mitral valve. Recommend TEE if clinically indicated. Comparison(s): No significant change from prior study in 2015. Conclusion(s)/Recommendation(s): No evidence of valvular vegetations on this transthoracic echocardiogram. Consider a transesophageal echocardiogram to exclude infective endocarditis if clinically indicated. FINDINGS   Left Ventricle: Left ventricular ejection fraction, by estimation, is 60 to 65%. The left ventricle has normal function. The left ventricle has no regional wall motion  abnormalities. The left ventricular internal cavity size was normal in size. There is  no left ventricular hypertrophy. Diastolic function indeterminant due to AFib. Right Ventricle: The right ventricular size is normal. No increase in right ventricular wall thickness. Right ventricular systolic function is mildly reduced. There is normal pulmonary artery systolic pressure. The tricuspid regurgitant velocity is 2.80 m/s, and with an assumed right atrial pressure of 3 mmHg, the estimated right ventricular systolic pressure is 85.8 mmHg. Left Atrium: Left atrial size was severely dilated. Right Atrium: Right atrial size was mildly dilated. Pericardium: There is no evidence of pericardial effusion. Mitral Valve: The mitral valve is degenerative in appearance. There is mild thickening of the mitral valve leaflet(s). There is mild calcification of the mitral valve leaflet(s). Severe mitral annular calcification. Moderate mitral valve regurgitation. MV peak gradient, 13.2 mmHg. The mean mitral valve gradient is 4.0 mmHg. Tricuspid Valve: The tricuspid valve is normal in structure. Tricuspid valve regurgitation is moderate. Aortic Valve: The aortic valve is tricuspid. There is moderate calcification of the aortic valve. There is moderate thickening of the aortic valve. Aortic valve regurgitation is trivial. Aortic valve sclerosis/calcification is present, without any evidence of aortic stenosis. Pulmonic Valve: The pulmonic valve was normal in structure. Pulmonic valve regurgitation is trivial. Aorta: The aortic root and ascending aorta are structurally normal, with no evidence of dilitation. Venous: The inferior vena cava is normal in size with greater than 50% respiratory variability, suggesting right atrial pressure of 3 mmHg. IAS/Shunts: The atrial  septum is grossly normal. Additional Comments: A device lead is visualized.  LEFT VENTRICLE PLAX 2D LVIDd:         4.55 cm   Diastology LVIDs:         2.85 cm   LV e' medial:    7.51 cm/s LV PW:         1.05 cm   LV E/e' medial:  22.6 LV IVS:        0.80 cm   LV e' lateral:   8.05 cm/s LVOT diam:     2.00 cm   LV E/e' lateral: 21.1 LV SV:         45 LV SV Index:   23 LVOT Area:     3.14 cm  RIGHT VENTRICLE RV S prime:     5.33 cm/s TAPSE (M-mode): 1.4 cm LEFT ATRIUM              Index        RIGHT ATRIUM           Index LA diam:        5.80 cm  3.06 cm/m   RA Area:     21.40 cm LA Vol (A2C):   136.0 ml 71.64 ml/m  RA Volume:   52.40 ml  27.60 ml/m LA Vol (A4C):   131.5 ml 69.27 ml/m LA Biplane Vol: 132.0 ml 69.53 ml/m  AORTIC VALVE LVOT Vmax:   66.40 cm/s LVOT Vmean:  49.200 cm/s LVOT VTI:    0.142 m  AORTA Ao Root diam: 3.40 cm Ao Asc diam:  3.30 cm MITRAL VALVE                TRICUSPID VALVE MV Area (PHT): 4.01 cm     TR Peak grad:   31.4 mmHg MV Area VTI:   1.26 cm     TR Vmax:        280.00 cm/s MV Peak grad:  13.2 mmHg MV Mean grad:  4.0 mmHg     SHUNTS MV Vmax:       1.82 m/s     Systemic VTI:  0.14 m MV Vmean:      95.5 cm/s    Systemic Diam: 2.00 cm MV Decel Time: 189 msec MV E velocity: 170.00 cm/s MV A velocity: 73.20 cm/s MV E/A ratio:  2.32 Gwyndolyn Kaufman MD Electronically signed by Gwyndolyn Kaufman MD Signature Date/Time: 06/08/2022/3:50:54 PM    Final    CT Angio Chest PE W and/or Wo Contrast  Result Date: 06/08/2022 CLINICAL DATA:  High probability for acute pulmonary embolus. Sepsis. EXAM: CT ANGIOGRAPHY CHEST CT ABDOMEN AND PELVIS WITH CONTRAST TECHNIQUE: Multidetector CT imaging of the chest was performed using the standard protocol during bolus administration of intravenous contrast. Multiplanar CT image reconstructions and MIPs were obtained to evaluate the vascular anatomy. Multidetector CT imaging of the abdomen and pelvis was performed using the standard protocol during bolus  administration of intravenous contrast. RADIATION DOSE REDUCTION: This exam was performed according to the departmental dose-optimization program which includes automated exposure control, adjustment of the mA and/or kV according to patient size and/or use of iterative reconstruction technique. CONTRAST:  73m OMNIPAQUE IOHEXOL 350 MG/ML SOLN COMPARISON:  CT AP from 09/20/18 FINDINGS: CTA CHEST FINDINGS Cardiovascular: Satisfactory opacification of the pulmonary arteries to the segmental level. No evidence of pulmonary embolism. Mild cardiac enlargement. Aortic atherosclerosis and coronary artery calcifications. Mediastinum/Nodes: Thyroid gland and trachea are unremarkable. Mild increase caliber of the air-filled proximal esophagus is identified to the level of the carina. Below the carina there is focal dilatation of the thoracic esophagus containing food debris measuring 3.4 cm in diameter, image 87/6. This is compared with 1.3 cm above the carina. The distal esophagus just above the GE junction has a normal caliber. Underlying stricture formation cannot be excluded. No enlarged lymph nodes identified. Multiple calcified mediastinal and hilar lymph nodes are identified compatible with remote granulomatous disease. Lungs/Pleura: No pleural effusion, airspace consolidation or signs of pneumothorax. There is mild diffuse ground-glass attenuation identified throughout both lungs. Scattered calcified granulomas are noted bilaterally. Dependent changes noted within the posterior lung bases bilaterally. Musculoskeletal: Abnormal appearance of the lower thoracic spine at the T12-L1 level. There is complete erosion of the superior endplate of TS93and much of the anterior superior aspect of the T12 vertebral body. The inferior endplate of TT34also appears eroded. There is surrounding paravertebral soft tissue stranding, image 152/6. Retropulsion bone fragments identified into the canal, image 151/6. No discrete fluid  collection within the limitations of CT. Imaging findings are compatible with discitis/osteomyelitis at T11-12. Review of the MIP images confirms the above findings. CT ABDOMEN and PELVIS FINDINGS Hepatobiliary: No suspicious liver lesions. Multiple calcified granulomas noted. Gallbladder appears normal. No bile duct dilatation. Pancreas: Fatty infiltration of the pancreas. No signs of main duct dilatation, inflammation or mass. Spleen: No suspicious findings.  Numerous calcified granulomas. Adrenals/Urinary Tract: The adrenal glands are within normal limits. Bilateral renal cortical scarring and mild volume loss. Right kidney stone measures 3 mm. Benign Bosniak class 1 cyst arises off the inferior pole of the right kidney. No follow-up imaging recommended. No hydronephrosis identified bilaterally. Urinary bladder appears normal. Stomach/Bowel: Stomach is within normal limits. No bowel wall thickening, inflammation or distension. Sigmoid diverticulosis without signs of acute inflammation. Vascular/Lymphatic: Aortic atherosclerosis. No aneurysm. No signs of abdominopelvic adenopathy. Reproductive: Prostate is unremarkable. Other: No free fluid or fluid collections identified. Musculoskeletal: Postoperative changes from L4-5 hardware fixation with solid  fusion noted at L3-4 and L4-5. The disc space at L2-3 is indistinct with erosive in plate changes which are also suspicious for discitis/osteomyelitis. No significant surrounding inflammatory changes noted. Review of the MIP images confirms the above findings. IMPRESSION: 1. No evidence for acute pulmonary embolus. 2. Signs of acute discitis/osteomyelitis at T11-12 with significant endplate destruction of both vertebral bodies and retropulsion of bony fragment into the ventral spinal canal. Surrounding soft tissue stranding is identified. 3. Mild discitis/osteomyelitis is also suspected at the L2-3 level. Here, the disc space is indistinct with mild erosive changes  involving the endplates. No significant surrounding inflammatory fat stranding. 4. Mild diffuse ground-glass attenuation identified throughout both lungs which may reflect mild pulmonary edema. 5. There is abnormal dilatation of the proximal and midthoracic esophagus. This is most dilated just below the level of the carina. The distal segment of the esophagus has a normal caliber. Cannot exclude distal esophageal stricture. 6. Sequelae of remote granulomatous disease. 7. Nonobstructing right renal calculus. 8. Sigmoid diverticulosis without signs of acute inflammation. 9. Aortic Atherosclerosis (ICD10-I70.0). Critical Value/emergent results were called by telephone at the time of interpretation on 06/08/2022 at 12:44 pm to provider [facility] , who verbally acknowledged these results. Electronically Signed   By: Kerby Moors M.D.   On: 06/08/2022 12:45   CT ABDOMEN PELVIS W CONTRAST  Result Date: 06/08/2022 CLINICAL DATA:  High probability for acute pulmonary embolus. Sepsis. EXAM: CT ANGIOGRAPHY CHEST CT ABDOMEN AND PELVIS WITH CONTRAST TECHNIQUE: Multidetector CT imaging of the chest was performed using the standard protocol during bolus administration of intravenous contrast. Multiplanar CT image reconstructions and MIPs were obtained to evaluate the vascular anatomy. Multidetector CT imaging of the abdomen and pelvis was performed using the standard protocol during bolus administration of intravenous contrast. RADIATION DOSE REDUCTION: This exam was performed according to the departmental dose-optimization program which includes automated exposure control, adjustment of the mA and/or kV according to patient size and/or use of iterative reconstruction technique. CONTRAST:  57m OMNIPAQUE IOHEXOL 350 MG/ML SOLN COMPARISON:  CT AP from 09/20/18 FINDINGS: CTA CHEST FINDINGS Cardiovascular: Satisfactory opacification of the pulmonary arteries to the segmental level. No evidence of pulmonary embolism. Mild  cardiac enlargement. Aortic atherosclerosis and coronary artery calcifications. Mediastinum/Nodes: Thyroid gland and trachea are unremarkable. Mild increase caliber of the air-filled proximal esophagus is identified to the level of the carina. Below the carina there is focal dilatation of the thoracic esophagus containing food debris measuring 3.4 cm in diameter, image 87/6. This is compared with 1.3 cm above the carina. The distal esophagus just above the GE junction has a normal caliber. Underlying stricture formation cannot be excluded. No enlarged lymph nodes identified. Multiple calcified mediastinal and hilar lymph nodes are identified compatible with remote granulomatous disease. Lungs/Pleura: No pleural effusion, airspace consolidation or signs of pneumothorax. There is mild diffuse ground-glass attenuation identified throughout both lungs. Scattered calcified granulomas are noted bilaterally. Dependent changes noted within the posterior lung bases bilaterally. Musculoskeletal: Abnormal appearance of the lower thoracic spine at the T12-L1 level. There is complete erosion of the superior endplate of TC16and much of the anterior superior aspect of the T12 vertebral body. The inferior endplate of TL84also appears eroded. There is surrounding paravertebral soft tissue stranding, image 152/6. Retropulsion bone fragments identified into the canal, image 151/6. No discrete fluid collection within the limitations of CT. Imaging findings are compatible with discitis/osteomyelitis at T11-12. Review of the MIP images confirms the above findings. CT ABDOMEN and PELVIS  FINDINGS Hepatobiliary: No suspicious liver lesions. Multiple calcified granulomas noted. Gallbladder appears normal. No bile duct dilatation. Pancreas: Fatty infiltration of the pancreas. No signs of main duct dilatation, inflammation or mass. Spleen: No suspicious findings.  Numerous calcified granulomas. Adrenals/Urinary Tract: The adrenal glands are  within normal limits. Bilateral renal cortical scarring and mild volume loss. Right kidney stone measures 3 mm. Benign Bosniak class 1 cyst arises off the inferior pole of the right kidney. No follow-up imaging recommended. No hydronephrosis identified bilaterally. Urinary bladder appears normal. Stomach/Bowel: Stomach is within normal limits. No bowel wall thickening, inflammation or distension. Sigmoid diverticulosis without signs of acute inflammation. Vascular/Lymphatic: Aortic atherosclerosis. No aneurysm. No signs of abdominopelvic adenopathy. Reproductive: Prostate is unremarkable. Other: No free fluid or fluid collections identified. Musculoskeletal: Postoperative changes from L4-5 hardware fixation with solid fusion noted at L3-4 and L4-5. The disc space at L2-3 is indistinct with erosive in plate changes which are also suspicious for discitis/osteomyelitis. No significant surrounding inflammatory changes noted. Review of the MIP images confirms the above findings. IMPRESSION: 1. No evidence for acute pulmonary embolus. 2. Signs of acute discitis/osteomyelitis at T11-12 with significant endplate destruction of both vertebral bodies and retropulsion of bony fragment into the ventral spinal canal. Surrounding soft tissue stranding is identified. 3. Mild discitis/osteomyelitis is also suspected at the L2-3 level. Here, the disc space is indistinct with mild erosive changes involving the endplates. No significant surrounding inflammatory fat stranding. 4. Mild diffuse ground-glass attenuation identified throughout both lungs which may reflect mild pulmonary edema. 5. There is abnormal dilatation of the proximal and midthoracic esophagus. This is most dilated just below the level of the carina. The distal segment of the esophagus has a normal caliber. Cannot exclude distal esophageal stricture. 6. Sequelae of remote granulomatous disease. 7. Nonobstructing right renal calculus. 8. Sigmoid diverticulosis without  signs of acute inflammation. 9. Aortic Atherosclerosis (ICD10-I70.0). Critical Value/emergent results were called by telephone at the time of interpretation on 06/08/2022 at 12:44 pm to provider MADISON [facility] , who verbally acknowledged these results. Electronically Signed   By: Kerby Moors M.D.   On: 06/08/2022 12:45   CT Head Wo Contrast  Result Date: 06/08/2022 CLINICAL DATA:  Delirium.  Altered mental status. EXAM: CT HEAD WITHOUT CONTRAST TECHNIQUE: Contiguous axial images were obtained from the base of the skull through the vertex without intravenous contrast. RADIATION DOSE REDUCTION: This exam was performed according to the departmental dose-optimization program which includes automated exposure control, adjustment of the mA and/or kV according to patient size and/or use of iterative reconstruction technique. COMPARISON:  07/22/2021 FINDINGS: Brain: There is no evidence for acute hemorrhage, hydrocephalus, mass lesion, or abnormal extra-axial fluid collection. No definite CT evidence for acute infarction. Diffuse loss of parenchymal volume is consistent with atrophy. Patchy low attenuation in the deep hemispheric and periventricular white matter is nonspecific, but likely reflects chronic microvascular ischemic demyelination. Tiny lacunar infarcts in the basal ganglia bilaterally. Right cerebellar lacunar infarct is stable in the interval. Vascular: No hyperdense vessel or unexpected calcification. Skull: No evidence for fracture. No worrisome lytic or sclerotic lesion. Sinuses/Orbits: The visualized paranasal sinuses and mastoid air cells are clear. Visualized portions of the globes and intraorbital fat are unremarkable. Other: None. IMPRESSION: 1. No acute intracranial abnormality. 2. Atrophy with chronic small vessel ischemic disease. Electronically Signed   By: Misty Stanley M.D.   On: 06/08/2022 12:12   DG Chest Portable 1 View  Result Date: 06/08/2022 CLINICAL DATA:  Shortness of breath.  Hypoxia. Altered mental status. EXAM: PORTABLE CHEST 1 VIEW COMPARISON:  03/06/2017 FINDINGS: Chronic cardiomegaly. Pacemaker with lead in the region of the right ventricle. The lungs are clear without infiltrate, collapse or effusion. Few scattered calcified granulomas. IMPRESSION: No active disease.  Cardiomegaly and pacemaker. Electronically Signed   By: Nelson Chimes M.D.   On: 06/08/2022 10:28      Subjective: No complaints  Discharge Exam: Vitals:   06/15/22 0748 06/15/22 1216  BP: (!) 176/82 (!) 116/59  Pulse: 70 83  Resp: 18 18  Temp: 97.9 F (36.6 C) 98.4 F (36.9 C)  SpO2: 100% 100%   Vitals:   06/15/22 0351 06/15/22 0435 06/15/22 0748 06/15/22 1216  BP: (!) 187/79  (!) 176/82 (!) 116/59  Pulse: 68  70 83  Resp: _0 Temp: 97.7 F (36.5 C)  97.9 F (36.6 C) 98.4 F (36.9 C)  TempSrc: Oral  Oral Oral  SpO2: 100%  100% 100%  Weight:  61.7 kg    Height:        General: Pt is alert, awake, not in acute distress Cardiovascular: RRR, S1/S2 +, no rubs, no gallops Respiratory: CTA bilaterally, no wheezing, no rhonchi Abdominal: Soft, NT, ND, bowel sounds + Extremities: no edema, no cyanosis    The results of significant diagnostics from this hospitalization (including imaging, microbiology, ancillary and laboratory) are listed below for reference.     Microbiology: Recent Results (from the past 240 hour(s))  Urine Culture     Status: None   Collection Time: 06/08/22  9:13 AM   Specimen: Urine, Clean Catch  Result Value Ref Range Status   Specimen Description URINE, CLEAN CATCH  Final   Special Requests NONE  Final   Culture   Final    NO GROWTH Performed at [facility], [address] [place], [place] [address]    Report Status 06/09/2022 FINAL  Final  Blood Culture (routine x 2)     Status: None   Collection Time: 06/08/22  9:28 AM   Specimen: BLOOD RIGHT FOREARM  Result Value Ref Range Status   Specimen Description BLOOD RIGHT FOREARM   Final   Special Requests   Final    BOTTLES DRAWN AEROBIC AND ANAEROBIC Blood Culture adequate volume   Culture   Final    NO GROWTH 5 DAYS Performed at [facility], [place] [address] [place], [place] [address]    Report Status 06/13/2022 FINAL  Final  Blood Culture (routine x 2)     Status: None   Collection Time: 06/08/22 10:08 AM   Specimen: BLOOD LEFT HAND  Result Value Ref Range Status   Specimen Description BLOOD LEFT HAND  Final   Special Requests   Final    BOTTLES DRAWN AEROBIC AND ANAEROBIC Blood Culture adequate volume   Culture   Final    NO GROWTH 5 DAYS Performed at [facility], [place] [address], [place], [place] [address]    Report Status 06/13/2022 FINAL  Final     Labs: BNP (last 3 results) Recent Labs    06/08/22 0928  BNP 427.0*   Basic Metabolic Panel: Recent Labs  Lab 06/09/22 0359 06/11/22 0618  NA 140 132*  K 3.2* 3.7  CL 106 105  CO2 22 18*  GLUCOSE 151* 112*  BUN 21 25*  CREATININE 1.70* 1.82*  CALCIUM 9.2 8.6*   Liver Function Tests: No results for input(s): "AST", "ALT", "ALKPHOS", "BILITOT", "PROT", "ALBUMIN"  in the last 168 hours. No results for input(s): "LIPASE", "AMYLASE" in the last 168 hours. No results for input(s): "AMMONIA" in the last 168 hours.  CBC: Recent Labs  Lab 06/11/22 0618 06/12/22 0403 06/13/22 0150 06/14/22 0541 06/15/22 0338  WBC 7.0 6.6 6.6 8.1 7.0  HGB 10.5* 11.0* 10.7* 11.5* 10.6*  HCT 32.3* 33.2* 31.9* 34.0* 31.5*  MCV 83.9 82.4 82.9 82.3 82.7  PLT 212 210 193 169 164   Cardiac Enzymes: Recent Labs  Lab 06/14/22 0541  CKTOTAL 13*   BNP: Invalid input(s): "POCBNP" CBG: Recent Labs  Lab 06/14/22 1209 06/14/22 1720 06/14/22 2112 06/15/22 0611 06/15/22 1212  GLUCAP 198* 230* 195* 188* 261*   D-Dimer No results for input(s): "DDIMER" in the last 72 hours. Hgb A1c No results for input(s): "HGBA1C" in the last 72 hours. Lipid Profile No results for input(s): "CHOL", "HDL",  "LDLCALC", "TRIG", "CHOLHDL", "LDLDIRECT" in the last 72 hours. Thyroid function studies No results for input(s): "TSH", "T4TOTAL", "T3FREE", "THYROIDAB" in the last 72 hours.  Invalid input(s): "FREET3" Anemia work up No results for input(s): "VITAMINB12", "FOLATE", "FERRITIN", "TIBC", "IRON", "RETICCTPCT" in the last 72 hours. Urinalysis    Component Value Date/Time   COLORURINE STRAW (A) 06/08/2022 1000   APPEARANCEUR CLEAR 06/08/2022 1000   LABSPEC 1.008 06/08/2022 1000   PHURINE 7.0 06/08/2022 1000   GLUCOSEU 50 (A) 06/08/2022 1000   HGBUR NEGATIVE 06/08/2022 1000   BILIRUBINUR NEGATIVE 06/08/2022 1000   BILIRUBINUR n 03/01/2013 1613   KETONESUR 5 (A) 06/08/2022 1000   PROTEINUR >=300 (A) 06/08/2022 1000   UROBILINOGEN 0.2 01/16/2014 2042   NITRITE NEGATIVE 06/08/2022 1000   LEUKOCYTESUR NEGATIVE 06/08/2022 1000   Sepsis Labs Recent Labs  Lab 06/12/22 0403 06/13/22 0150 06/14/22 0541 06/15/22 0338  WBC 6.6 6.6 8.1 7.0   Microbiology Recent Results (from the past 240 hour(s))  Urine Culture     Status: None   Collection Time: 06/08/22  9:13 AM   Specimen: Urine, Clean Catch  Result Value Ref Range Status   Specimen Description URINE, CLEAN CATCH  Final   Special Requests NONE  Final   Culture   Final    NO GROWTH Performed at [facility], [place] [address], [place], [place] [address]    Report Status 06/09/2022 FINAL  Final  Blood Culture (routine x 2)     Status: None   Collection Time: 06/08/22  9:28 AM   Specimen: BLOOD RIGHT FOREARM  Result Value Ref Range Status   Specimen Description BLOOD RIGHT FOREARM  Final   Special Requests   Final    BOTTLES DRAWN AEROBIC AND ANAEROBIC Blood Culture adequate volume   Culture   Final    NO GROWTH 5 DAYS Performed at [facility], [place] [address], [place], [place] [address]    Report Status 06/13/2022 FINAL  Final  Blood Culture (routine x 2)     Status: None   Collection Time: 06/08/22 10:08 AM    Specimen: BLOOD LEFT HAND  Result Value Ref Range Status   Specimen Description BLOOD LEFT HAND  Final   Special Requests   Final    BOTTLES DRAWN AEROBIC AND ANAEROBIC Blood Culture adequate volume   Culture   Final    NO GROWTH 5 DAYS Performed at [facility], [place] [address], [place], [place] [address]    Report Status 06/13/2022 FINAL  Final      SIGNED:   Charlynne Cousins, MD  [facility] 06/15/2022, 4:32 PM Pager   If 7PM-7AM, please contact night-coverage [email or web address] Password [email or web address]

## 2022-06-15 NOTE — Progress Notes (Signed)
Physical Therapy Treatment Patient Details Name: [name] MRN: [id number] DOB: 1935-08-25 Today's Date: 06/15/2022   History of Present Illness Pt is an 87 y/o male presented with sudden onset of confusion and worsening low back pain.  Work-up revealed acute discitis and osteomyelitis of the thoracic and lumbar spine. PMH including but not limited to right AKA, HTN, a-fib, CHF.    PT Comments    Pt eager to participate and go to rehab. Extensive amount of time spent on adjustment of TLSO for optimal support and prevention of skin breakdown. Pt requiring maxA to power up into walker however unable to complete pvt transfer to recliner. Pt requires use of stedy for safe transfer to the recliner. Pt remains appropriate for SNF upon d/c. Acute PT to cont to follow.    Recommendations for follow up therapy are one component of a multi-disciplinary discharge planning process, led by the attending physician.  Recommendations may be updated based on patient status, additional functional criteria and insurance authorization.  Follow Up Recommendations  Skilled nursing-short term rehab (<3 hours/day) Can patient physically be transported by private vehicle: No   Assistance Recommended at Discharge Frequent or constant Supervision/Assistance  Patient can return home with the following Two people to help with walking and/or transfers;Two people to help with bathing/dressing/bathroom;Assistance with cooking/housework;Assist for transportation;Help with stairs or ramp for entrance   Equipment Recommendations  None recommended by PT    Recommendations for Other Services       Precautions / Restrictions Precautions Precautions: Fall;Back Precaution Booklet Issued: No Precaution Comments: precautions verbally reviewed Required Braces or Orthoses: Spinal Brace Spinal Brace: Thoracolumbosacral orthotic;Applied in sitting position Restrictions Weight Bearing Restrictions: No     Mobility   Bed Mobility               General bed mobility comments: pt sitting EOB with OT upon PT arrival    Transfers Overall transfer level: Needs assistance Equipment used: Rolling walker (2 wheels), Ambulation equipment used Transfers: Sit to/from Stand Sit to Stand: Max assist, +2 physical assistance           General transfer comment: maxAx1 to power up into standing in walker, max directional verbal cues, pt then used stedy, requiring maxAX2 to stand up into stedy and then again to stand up from stedy to sit in chair Transfer via Lift Equipment: Stedy  Ambulation/Gait               General Gait Details: unable   Stairs             Wheelchair Mobility    Modified Rankin (Stroke Patients Only)       Balance Overall balance assessment: Needs assistance Sitting-balance support: Single extremity supported Sitting balance-Leahy Scale: Fair Sitting balance - Comments: able to sit EOB supervision for > 5 mins   Standing balance support: Bilateral upper extremity supported Standing balance-Leahy Scale: Poor Standing balance comment: required BUE support and external assist                            Cognition Arousal/Alertness: Awake/alert Behavior During Therapy: WFL for tasks assessed/performed Overall Cognitive Status: Impaired/Different from baseline Area of Impairment: Memory, Following commands, Problem solving, Awareness                     Memory: Decreased short-term memory Following Commands: Follows one step commands with increased time, Follows multi-step commands inconsistently   Awareness:  Emergent Problem Solving: Slow processing, Difficulty sequencing General Comments: pt requiring significant increased time to process constantly stating "let me think this through" PT would suggest something and pt would defer and then later asked to what PT suggested        Exercises      General Comments General comments (skin  integrity, edema, etc.): VSS, extensive amount of time spent on adjusting TLSO for optimal support and decreased skin breakdown at neck      Pertinent Vitals/Pain Pain Assessment Pain Assessment: Faces Faces Pain Scale: Hurts little more Pain Location: L chest piece of TLSO Pain Descriptors / Indicators: Grimacing, Discomfort Pain Intervention(s): Monitored during session    Home Living                          Prior Function            PT Goals (current goals can now be found in the care plan section) Acute Rehab PT Goals PT Goal Formulation: With patient Time For Goal Achievement: 06/26/22 Potential to Achieve Goals: Good Progress towards PT goals: Progressing toward goals    Frequency    Min 2X/week      PT Plan Current plan remains appropriate    Co-evaluation              AM-PAC PT "6 Clicks" Mobility   Outcome Measure  Help needed turning from your back to your side while in a flat bed without using bedrails?: A Lot Help needed moving from lying on your back to sitting on the side of a flat bed without using bedrails?: A Lot Help needed moving to and from a bed to a chair (including a wheelchair)?: Total Help needed standing up from a chair using your arms (e.g., wheelchair or bedside chair)?: Total     6 Click Score: 6    End of Session Equipment Utilized During Treatment: Gait belt;Back brace Activity Tolerance: Patient tolerated treatment well Patient left: in chair;with call bell/phone within reach;with chair alarm set Nurse Communication: Mobility status;Need for lift equipment PT Visit Diagnosis: Other abnormalities of gait and mobility (R26.89);Pain     Time: 9509-3267 PT Time Calculation (min) (ACUTE ONLY): 35 min  Charges:  $Therapeutic Exercise: 8-22 mins $Therapeutic Activity: 8-22 mins                     Kittie Plater, PT, DPT Acute Rehabilitation Services Secure chat preferred Office #: [phone]    Berline Lopes 06/15/2022, 1:27 PM

## 2022-06-15 NOTE — Progress Notes (Signed)
Occupational Therapy Treatment Patient Details Name: [name] MRN: [id number] DOB: [birth year] Today's Date: 06/15/2022   History of present illness Pt is an 87 y/o male presented with sudden onset of confusion and worsening low back pain.  Work-up revealed acute discitis and osteomyelitis of the thoracic and lumbar spine. PMH including but not limited to right AKA, HTN, a-fib, CHF.   OT comments  Pt making steady progress towards OT goals this session. Pt continues to present with increased back pain, back precautions, decreased activity tolerance and generalized deconditioning impacting pts ability to complete BADLs independently. Pt currently requires [place] for bed mobility, supervision for static sitting balance, MAX A for LB ADLs and MAX A for UB ADLs. Session focus on functional sit>stands from EOB with Rw to increase LB strength and improve overall activity tolerance, pt required MAX A for sit>stand from EOB with RW. Pt would continue to benefit from skilled occupational therapy while admitted and after d/c to address the below listed limitations in order to improve overall functional mobility and facilitate independence with BADL participation. DC plan remains appropriate, will follow acutely per POC.      Recommendations for follow up therapy are one component of a multi-disciplinary discharge planning process, led by the attending physician.  Recommendations may be updated based on patient status, additional functional criteria and insurance authorization.    Follow Up Recommendations  Skilled nursing-short term rehab (<3 hours/day)    Assistance Recommended at Discharge Frequent or constant Supervision/Assistance  Patient can return home with the following  Help with stairs or ramp for entrance;Assist for transportation;Assistance with cooking/housework;Two people to help with walking and/or transfers;A lot of help with bathing/dressing/bathroom   Equipment Recommendations   None recommended by OT    Recommendations for Other Services      Precautions / Restrictions Precautions Precautions: Fall;Back Precaution Booklet Issued: No Precaution Comments: precautions verbally reviewed Required Braces or Orthoses: Spinal Brace Spinal Brace: Thoracolumbosacral orthotic;Applied in sitting position Restrictions Weight Bearing Restrictions: No       Mobility Bed Mobility Overal bed mobility: Needs Assistance Bed Mobility: Rolling, Sidelying to Sit Rolling: Min assist Sidelying to sit: Min assist       General bed mobility comments: + time and effort to sequence task, pt fixated on not wanting to put too much force one RUE d/t pic line therefore increased assisted needing to elevate trunk    Transfers Overall transfer level: Needs assistance Equipment used: Rolling walker (2 wheels) Transfers: Sit to/from Stand Sit to Stand: Max assist           General transfer comment: MAX A +1 to power into standing from EOB with pt preferring to pull up on RW needing assitance to steady RW. + difficulty shifting weight anteriorly and elevating trunk fully into upright posture     Balance Overall balance assessment: Needs assistance Sitting-balance support: Single extremity supported Sitting balance-Leahy Scale: Fair Sitting balance - Comments: able to sit EOB supervision for > 5 mins   Standing balance support: Bilateral upper extremity supported Standing balance-Leahy Scale: Poor Standing balance comment: required BUE support and external assist                           ADL either performed or assessed with clinical judgement   ADL Overall ADL's : Needs assistance/impaired                 Upper Body Dressing : Maximal assistance;Sitting Upper  Body Dressing Details (indicate cue type and reason): to don brace Lower Body Dressing: Maximal assistance;Sitting/lateral leans Lower Body Dressing Details (indicate cue type and reason): to  don shoe Toilet Transfer: Maximal assistance Toilet Transfer Details (indicate cue type and reason): simulated via sit>stand from EOB with Rw and MAX A +1         Functional mobility during ADLs: Maximal assistance;Rolling walker (2 wheels);Cueing for safety;Cueing for sequencing (sit>stand only) General ADL Comments: pt limited by increased pain and generalized deconditioning    Extremity/Trunk Assessment Upper Extremity Assessment Upper Extremity Assessment: Generalized weakness   Lower Extremity Assessment Lower Extremity Assessment: Defer to PT evaluation   Cervical / Trunk Assessment Cervical / Trunk Assessment: Kyphotic    Vision Baseline Vision/History: 1 Wears glasses Patient Visual Report: No change from baseline     Perception Perception Perception: Within Functional Limits   Praxis Praxis Praxis: Intact    Cognition Arousal/Alertness: Awake/alert Behavior During Therapy: WFL for tasks assessed/performed Overall Cognitive Status: Impaired/Different from baseline Area of Impairment: Memory, Following commands, Problem solving, Awareness                     Memory: Decreased short-term memory Following Commands: Follows one step commands with increased time, Follows multi-step commands inconsistently   Awareness: Emergent Problem Solving: Slow processing, Difficulty sequencing General Comments: pt requires increased time to process often stating, "give me a minute to think" pt needed incresaed time to follow commands related to mobility        Exercises      Shoulder Instructions       General Comments HR up to 124 bpm with activity, daughter initially present at start of session    Pertinent Vitals/ Pain       Pain Assessment Pain Assessment: Faces Faces Pain Scale: Hurts little more Pain Location: back Pain Descriptors / Indicators: Grimacing, Discomfort Pain Intervention(s): Monitored during session  Home Living                                           Prior Functioning/Environment              Frequency  Min 2X/week        Progress Toward Goals  OT Goals(current goals can now be found in the care plan section)  Progress towards OT goals: Progressing toward goals  Acute Rehab OT Goals Patient Stated Goal: go to rehab OT Goal Formulation: With patient Time For Goal Achievement: 06/24/22 Potential to Achieve Goals: Good ADL Goals Pt Will Perform Grooming: sitting;with modified independence Pt Will Perform Lower Body Dressing: with min assist;sitting/lateral leans;with adaptive equipment Pt Will Transfer to Toilet: with min assist;bedside commode;squat pivot transfer Additional ADL Goal #1: Pt will complete bed mobility with min assist and maintain static sitting balance with supervision for 5 minutes as precursor to ADLs.  Plan Discharge plan remains appropriate;Frequency remains appropriate    Co-evaluation                 AM-PAC OT "6 Clicks" Daily Activity     Outcome Measure   Help from another person eating meals?: A Little Help from another person taking care of personal grooming?: A Little Help from another person toileting, which includes using toliet, bedpan, or urinal?: A Lot Help from another person bathing (including washing, rinsing, drying)?: A Lot Help from another person to  put on and taking off regular upper body clothing?: A Lot Help from another person to put on and taking off regular lower body clothing?: A Lot 6 Click Score: 14    End of Session Equipment Utilized During Treatment: Gait belt;Rolling walker (2 wheels);Back brace  OT Visit Diagnosis: Unsteadiness on feet (R26.81);Muscle weakness (generalized) (M62.81)   Activity Tolerance Patient tolerated treatment well   Patient Left in bed;with call bell/phone within reach;Other (comment) (sitting EOB with PT present)   Nurse Communication Mobility status        Time: 6606-3016 OT Time  Calculation (min): 37 min  Charges: OT General Charges $OT Visit: 1 Visit OT Treatments $Self Care/Home Management : 8-22 mins $Therapeutic Activity: 8-22 mins  Harley Alto., COTA/L Acute Rehabilitation Services [phone]   Precious Haws 06/15/2022, 12:04 PM

## 2022-06-16 DIAGNOSIS — E1169 Type 2 diabetes mellitus with other specified complication: Secondary | ICD-10-CM | POA: Diagnosis not present

## 2022-06-16 DIAGNOSIS — Z89511 Acquired absence of right leg below knee: Secondary | ICD-10-CM | POA: Diagnosis not present

## 2022-06-16 DIAGNOSIS — M869 Osteomyelitis, unspecified: Secondary | ICD-10-CM | POA: Diagnosis not present

## 2022-06-16 DIAGNOSIS — M720 Palmar fascial fibromatosis [Dupuytren]: Secondary | ICD-10-CM | POA: Diagnosis not present

## 2022-06-16 DIAGNOSIS — D649 Anemia, unspecified: Secondary | ICD-10-CM | POA: Diagnosis not present

## 2022-06-21 ENCOUNTER — Other Ambulatory Visit: Payer: Self-pay | Admitting: *Deleted

## 2022-06-21 NOTE — Patient Outreach (Signed)

## 2022-06-22 DIAGNOSIS — I480 Paroxysmal atrial fibrillation: Secondary | ICD-10-CM | POA: Diagnosis not present

## 2022-06-22 DIAGNOSIS — G9341 Metabolic encephalopathy: Secondary | ICD-10-CM | POA: Diagnosis not present

## 2022-06-22 DIAGNOSIS — M869 Osteomyelitis, unspecified: Secondary | ICD-10-CM | POA: Diagnosis not present

## 2022-06-22 DIAGNOSIS — M4646 Discitis, unspecified, lumbar region: Secondary | ICD-10-CM | POA: Diagnosis not present

## 2022-06-22 DIAGNOSIS — E785 Hyperlipidemia, unspecified: Secondary | ICD-10-CM | POA: Diagnosis not present

## 2022-06-22 DIAGNOSIS — M6281 Muscle weakness (generalized): Secondary | ICD-10-CM | POA: Diagnosis not present

## 2022-06-22 DIAGNOSIS — E1169 Type 2 diabetes mellitus with other specified complication: Secondary | ICD-10-CM | POA: Diagnosis not present

## 2022-06-22 DIAGNOSIS — I5032 Chronic diastolic (congestive) heart failure: Secondary | ICD-10-CM | POA: Diagnosis not present

## 2022-06-23 DIAGNOSIS — E1169 Type 2 diabetes mellitus with other specified complication: Secondary | ICD-10-CM | POA: Diagnosis not present

## 2022-06-28 ENCOUNTER — Other Ambulatory Visit: Payer: Self-pay | Admitting: *Deleted

## 2022-06-28 DIAGNOSIS — K5900 Constipation, unspecified: Secondary | ICD-10-CM | POA: Diagnosis not present

## 2022-06-28 DIAGNOSIS — E1169 Type 2 diabetes mellitus with other specified complication: Secondary | ICD-10-CM | POA: Diagnosis not present

## 2022-06-28 NOTE — Patient Outreach (Signed)
[place] Coordinator follow up. Mr. Paolillo resides in [facility].   Facility site visit to [facility]. Met with [facility], [facility] on IV antibiotics until August 30th. Mr. Turvey daughter Vicente Males meeting with [employer] to possibly apply for Medicaid for potential LTC.  Will continue to follow for transition plans.   [facility] with [facility] has [facility] care management.   Marthenia Rolling, MSN, RN,BSN [place] Acute Care Coordinator [phone] [facility]) [phone]  (Toll free office)

## 2022-06-29 DIAGNOSIS — M4646 Discitis, unspecified, lumbar region: Secondary | ICD-10-CM | POA: Diagnosis not present

## 2022-06-29 DIAGNOSIS — E785 Hyperlipidemia, unspecified: Secondary | ICD-10-CM | POA: Diagnosis not present

## 2022-06-29 DIAGNOSIS — E1169 Type 2 diabetes mellitus with other specified complication: Secondary | ICD-10-CM | POA: Diagnosis not present

## 2022-06-29 DIAGNOSIS — M6281 Muscle weakness (generalized): Secondary | ICD-10-CM | POA: Diagnosis not present

## 2022-06-29 DIAGNOSIS — I480 Paroxysmal atrial fibrillation: Secondary | ICD-10-CM | POA: Diagnosis not present

## 2022-06-29 DIAGNOSIS — G9341 Metabolic encephalopathy: Secondary | ICD-10-CM | POA: Diagnosis not present

## 2022-06-29 DIAGNOSIS — I5032 Chronic diastolic (congestive) heart failure: Secondary | ICD-10-CM | POA: Diagnosis not present

## 2022-06-29 DIAGNOSIS — M869 Osteomyelitis, unspecified: Secondary | ICD-10-CM | POA: Diagnosis not present

## 2022-07-01 DIAGNOSIS — I480 Paroxysmal atrial fibrillation: Secondary | ICD-10-CM | POA: Diagnosis not present

## 2022-07-01 DIAGNOSIS — E785 Hyperlipidemia, unspecified: Secondary | ICD-10-CM | POA: Diagnosis not present

## 2022-07-01 DIAGNOSIS — M4646 Discitis, unspecified, lumbar region: Secondary | ICD-10-CM | POA: Diagnosis not present

## 2022-07-01 DIAGNOSIS — I5032 Chronic diastolic (congestive) heart failure: Secondary | ICD-10-CM | POA: Diagnosis not present

## 2022-07-01 DIAGNOSIS — G9341 Metabolic encephalopathy: Secondary | ICD-10-CM | POA: Diagnosis not present

## 2022-07-01 DIAGNOSIS — M869 Osteomyelitis, unspecified: Secondary | ICD-10-CM | POA: Diagnosis not present

## 2022-07-01 DIAGNOSIS — E1169 Type 2 diabetes mellitus with other specified complication: Secondary | ICD-10-CM | POA: Diagnosis not present

## 2022-07-01 DIAGNOSIS — M6281 Muscle weakness (generalized): Secondary | ICD-10-CM | POA: Diagnosis not present

## 2022-07-04 DIAGNOSIS — L97521 Non-pressure chronic ulcer of other part of left foot limited to breakdown of skin: Secondary | ICD-10-CM | POA: Diagnosis not present

## 2022-07-04 DIAGNOSIS — L89153 Pressure ulcer of sacral region, stage 3: Secondary | ICD-10-CM | POA: Diagnosis not present

## 2022-07-05 ENCOUNTER — Other Ambulatory Visit: Payer: Self-pay | Admitting: *Deleted

## 2022-07-05 NOTE — Progress Notes (Signed)
Radiation Oncology         [phone] ________________________________  Initial Outpatient Consultation  Name: [name] MRN: [id number]  Date: 07/06/2022  DOB: [birth year]  [id number], Jori Moll, MD  Griselda Miner, MD   REFERRING PHYSICIAN: Griselda Miner, MD  DIAGNOSIS: No diagnosis found.  Squamous cell carcinoma right superior helix and lateral external helix   Cancer Staging  No matching staging information was found for the patient.  CHIEF COMPLAINT: Here to discuss management of skin cancer  HISTORY OF PRESENT ILLNESS::[name] is a 87 y.o. male who presented to Dr. Sarajane Jews on 05/31/22 for possible mohs and further evaluation of 2 foci of squamous cell carcinoma located on the right helix (mid-superior helix). On examination, the lesion was noted to erythematous, scaly, crusted. The option of Mohs surgery was discussed with the patient, however the patient voiced interest in proceeding with XRT over Mohs. Given the patient's poor general health, Dr. Sarajane Jews agreed that this would be a reasonable approach, and promptly referred the patient to me for consideration of radiation.   On examination, the patient was also noted to have a multiple scaly lesions which were sites of actinic keratosis. These are located on his right inferior central-anterior scalp, right superior lateral forehead, left mid-central anterior scalp, and left mid lateral forehead. Dr. Sarajane Jews treated these areas with cryotherapy during the visit.     PREVIOUS RADIATION THERAPY: Yes, with Dr. Valere Dross in 2015  - Diagnosis: Multiple squamous cell carcinomas, both invasive and noninvasive involving his scalp  - Treatment Dates: 06/16/2014 through 07/25/2014 - Treatment Intent: curative  - Site/dose: Scalp 6000 cGy in 30 sessions  - Beams/energy: 6 MEV electrons with 0.8 cm bolus to maximize the dose to the skin surface.    Pacemaker/ICD? Yes: 06/22/2011 (Dr. Rayann Heman)  PAST MEDICAL HISTORY:   has a past medical history of Atrial fibrillation ([place]), Backache, unspecified, Cellulitis and abscess of leg, except foot, CHF (congestive heart failure) ([place]), Diverticulosis (05-2006), Esophageal stricture, Fatigue, GERD (gastroesophageal reflux disease), Gout, unspecified, colonic polyps (05-2006), radiation therapy (06/16/14- 07/18/14), Hypertension, Long term (current) use of anticoagulants, Overweight(278.02), Pacemaker- [name] (02/20/2013), Phlebitis and thrombophlebitis of superficial vessels of lower extremities, Right bundle branch block, Skin cancer, Syncope and collapse, Tachycardia-bradycardia ([place]), Type II or unspecified type diabetes mellitus without mention of complication, not stated as uncontrolled, and Unspecified venous (peripheral) insufficiency.    PAST SURGICAL HISTORY: Past Surgical History:  Procedure Laterality Date   ABOVE KNEE LEG AMPUTATION Right 04/02/2014   DR DUDA   AMPUTATION Right 01/20/2014   Procedure: AMPUTATION BELOW KNEE- right;  Surgeon: [name], MD;  Location: [place];  Service: Orthopedics;  Laterality: Right;  Right Below Knee Amputation   AMPUTATION Right 02/14/2014   Procedure: AMPUTATION BELOW KNEE;  Surgeon: [name], MD;  Location: [place];  Service: Orthopedics;  Laterality: Right;  Right Below Knee Amputation Revision   AMPUTATION Right 03/07/2014   Procedure: AMPUTATION BELOW KNEE;  Surgeon: [name], MD;  Location: [place];  Service: Orthopedics;  Laterality: Right;  Right Below Knee Amputation Revision   AMPUTATION Right 03/18/2014   Procedure: revision of  AMPUTATION BELOW KNEE;  Surgeon: [name], MD;  Location: [facility];  Service: Orthopedics;  Laterality: Right;  Revision Right Below Knee Amputation   AMPUTATION Right 04/02/2014   Procedure: AMPUTATION ABOVE KNEE;  Surgeon: [name], MD;  Location: [place];  Service: Orthopedics;  Laterality: Right;  Right Above Knee Amputation  EYE SURGERY     I & D EXTREMITY Right 01/17/2014    Procedure: IRRIGATION AND DEBRIDEMENT RIGHT HEEL  WITH CULTURES AND BONE BIOPSY, placement of wound vac;  Surgeon: Theodoro Kos, DO;  Location: [place];  Service: Plastics;  Laterality: Right;   LAMINECTOMY     LUMBAR FUSION     PACEMAKER INSERTION  8/12   SJM by [name] for tachy/brady syndrome   ROTATOR CUFF REPAIR     ruptured rt rectus muscle     SKIN BIOPSY     scalp    FAMILY HISTORY: family history includes Diabetes in his father; Heart disease in his father; Pneumonia in his mother.  SOCIAL HISTORY:  reports that he has quit smoking. He has never used smokeless tobacco. He reports that he does not drink alcohol and does not use drugs.  ALLERGIES: Morphine and Statins  MEDICATIONS:  Current Outpatient Medications  Medication Sig Dispense Refill   ACCU-CHEK SOFTCLIX LANCETS lancets Use to check blood sugar three times daily. Dx E11.9 300 each 3   apixaban (ELIQUIS) 5 MG TABS tablet Take 5 mg by mouth 2 (two) times daily.      B-D ULTRAFINE III SHORT PEN 31G X 8 MM MISC USE AS DIRECTED 100 each 11   baclofen (LIORESAL) 10 MG tablet Take 10 mg by mouth every 8 (eight) hours.     Blood Glucose Calibration (ACCU-CHEK AVIVA) SOLN Use as Directed. Dx E11.9 3 each 3   cefTRIAXone (ROCEPHIN) IVPB Inject 2 g into the vein daily. Indication:  Possible discitis/osteomyelitis  First Dose: Yes Last Day of Therapy:  07/20/22  Labs - Once weekly:  CBC/D and BMP, Labs - Every other week:  ESR and CRP 37 Units 0   daptomycin (CUBICIN) IVPB Inject 500 mg into the vein every other day. Indication:  Possible discitis/osteomyelitis  First Dose: Yes Last Day of Therapy:  07/20/22  Labs - Once weekly:  CBC/D, BMP, and CPK Labs - Every other week:  ESR and CRP 18 Units 0   diltiazem (CARDIZEM CD) 180 MG 24 hr capsule Take 180 mg by mouth daily.     furosemide (LASIX) 20 MG tablet Take 20 mg by mouth every other day.     gabapentin (NEURONTIN) 100 MG capsule Take 100 mg by mouth 2 (two) times daily.      glucose blood (ACCU-CHEK AVIVA PLUS) test strip DX E11.9, check blood sugar three times daily 300 each 11   insulin lispro (HUMALOG) 100 UNIT/ML KiwkPen Inject 0.05 mLs (5 Units total) into the skin 3 (three) times daily before meals. 15 mL 11   melatonin 5 MG TABS Take 5 mg by mouth at bedtime.     Multiple Vitamins-Minerals (MULTIVITAMIN PO) Take 1 tablet by mouth daily. Takes a Men's 50 Plus Multivitamin     omeprazole (PRILOSEC) 20 MG capsule Take 20 mg by mouth daily.     Oxycodone HCl 10 MG TABS Take 1 tablet (10 mg total) by mouth every 8 (eight) hours as needed. For pain 5 tablet 0   No current facility-administered medications for this encounter.    REVIEW OF SYSTEMS:  Notable for that above.   PHYSICAL EXAM:  vitals were not taken for this visit.   General: Alert and oriented, in no acute distress *** HEENT: Head is normocephalic. Extraocular movements are intact. Oropharynx is clear. Neck: Neck is supple, no palpable cervical or supraclavicular lymphadenopathy. Heart: Regular in rate and rhythm with no murmurs, rubs,  or gallops. Chest: Clear to auscultation bilaterally, with no rhonchi, wheezes, or rales. Abdomen: Soft, nontender, nondistended, with no rigidity or guarding. Extremities: No cyanosis or edema. Lymphatics: see Neck Exam Skin: No concerning lesions. Musculoskeletal: symmetric strength and muscle tone throughout. Neurologic: Cranial nerves II through XII are grossly intact. No obvious focalities. Speech is fluent. Coordination is intact. Psychiatric: Judgment and insight are intact. Affect is appropriate.   ECOG = ***  0 - Asymptomatic (Fully active, able to carry on all predisease activities without restriction)  1 - Symptomatic but completely ambulatory (Restricted in physically strenuous activity but ambulatory and able to carry out work of a light or sedentary nature. For example, light housework, office work)  2 - Symptomatic, <50% in bed during the  day (Ambulatory and capable of all self care but unable to carry out any work activities. Up and about more than 50% of waking hours)  3 - Symptomatic, >50% in bed, but not bedbound (Capable of only limited self-care, confined to bed or chair 50% or more of waking hours)  4 - Bedbound (Completely disabled. Cannot carry on any self-care. Totally confined to bed or chair)  5 - Death   Eustace Pen MM, Creech RH, Tormey DC, et al. [phone] "Toxicity and response criteria of the [facility]". [place] Oncol. 5 (6): 649-55   LABORATORY DATA:  Lab Results  Component Value Date   WBC 7.0 06/15/2022   HGB 10.6 (L) 06/15/2022   HCT 31.5 (L) 06/15/2022   MCV 82.7 06/15/2022   PLT 164 06/15/2022   CMP     Component Value Date/Time   NA 132 (L) 06/11/2022 0618   NA 140 04/28/2021 1403   K 3.7 06/11/2022 0618   CL 105 06/11/2022 0618   CO2 18 (L) 06/11/2022 0618   GLUCOSE 112 (H) 06/11/2022 0618   GLUCOSE 358 (H) 10/26/2006 1158   BUN 25 (H) 06/11/2022 0618   BUN 14 04/28/2021 1403   CREATININE 1.82 (H) 06/11/2022 0618   CREATININE 1.61 (H) 04/04/2017 0324   CALCIUM 8.6 (L) 06/11/2022 0618   PROT 7.2 06/08/2022 0928   PROT 6.5 01/11/2016 1509   ALBUMIN 3.7 06/08/2022 0928   ALBUMIN 3.6 01/11/2016 1509   AST 16 06/08/2022 0928   ALT 14 06/08/2022 0928   ALKPHOS 103 06/08/2022 0928   BILITOT 0.8 06/08/2022 0928   BILITOT 0.4 01/11/2016 1509   GFRNONAA 36 (L) 06/11/2022 0618   GFRNONAA 40 (L) 04/04/2017 0324   GFRAA 49 (L) 09/18/2020 1321   GFRAA 46 (L) 04/04/2017 0324         RADIOGRAPHY: DG CHEST PORT 1 VIEW  Result Date: 06/14/2022 CLINICAL DATA:  PICC placement EXAM: PORTABLE CHEST 1 VIEW COMPARISON:  06/14/2022 FINDINGS: Right arm PICC terminates at the cavoatrial junction. Lungs are clear.  No pleural effusion or pneumothorax. The heart is normal in size.  Left subclavian pacemaker. IMPRESSION: Right arm PICC terminates at the cavoatrial junction.  Electronically Signed   By: Julian Hy M.D.   On: 06/14/2022 20:52   DG Chest Port 1 View  Result Date: 06/14/2022 CLINICAL DATA:  PICC line placement EXAM: PORTABLE CHEST 1 VIEW COMPARISON:  06/08/2022 FINDINGS: Left-sided pacing device as before. Borderline to mild cardiomegaly. No acute airspace disease. Interim placement of right upper extremity central venous catheter with tip overlying the proximal right atrial region. IMPRESSION: No active disease. Right upper extremity central venous catheter tip projects over the proximal right atrial region. Electronically Signed  By: Donavan Foil M.D.   On: 06/14/2022 17:29   [place] EKG SITE RITE  Result Date: 06/13/2022 If Site Rite image not attached, placement could not be confirmed due to current cardiac rhythm.  [place] EKG SITE RITE  Result Date: 06/13/2022 If Site Rite image not attached, placement could not be confirmed due to current cardiac rhythm.  Overnight EEG with video  Result Date: 06/11/2022 Lora Havens, MD     06/12/2022  9:32 AM Patient Name: [name] MRN: [id number] Epilepsy Attending: Lora Havens Referring Physician/Provider: Samuella Cota, MD Duration: 06/10/2022 1202 to 06/11/2022 1004  Patient history: 87 year old male with altered mental status.  EEG to evaluate for seizure.  Level of alertness: Awake, asleep  AEDs during EEG study: None  Technical aspects: This EEG study was done with scalp electrodes positioned according to the 10-20 International system of electrode placement. Electrical activity was acquired at a sampling rate of 500Hz and reviewed with a high frequency filter of 70Hz and a low frequency filter of 1Hz. EEG data were recorded continuously and digitally stored.  Description: The posterior dominant rhythm consists of 8-9 Hz activity of moderate voltage (25-35 uV) seen predominantly in posterior head regions, symmetric and reactive to eye opening and eye closing. Sleep was characterized by  vertex waves, sleep spindles (12 to 14 Hz), maximal frontocentral region. EEG showed intermittent generalized sharply contoured 3 to 6 Hz theta-delta slowing. Hyperventilation and photic stimulation were not performed.   ABNORMALITY - Intermittent slow, generalized  IMPRESSION: This study is suggestive of mild diffuse encephalopathy, nonspecific etiology. No seizures or definite epileptiform discharges were seen during this study.  Lora Havens   EEG adult  Result Date: 06/09/2022 Lora Havens, MD     06/09/2022  5:38 PM Patient Name: [name] MRN: [id number] Epilepsy Attending: Lora Havens Referring Physician/Provider: Samuella Cota, MD Date: 06/09/2022 Duration: 21.22 mins Patient history: 87 year old male with altered mental status.  EEG to evaluate for seizure. Level of alertness: Awake AEDs during EEG study: None Technical aspects: This EEG study was done with scalp electrodes positioned according to the 10-20 International system of electrode placement. Electrical activity was acquired at a sampling rate of 500Hz and reviewed with a high frequency filter of 70Hz and a low frequency filter of 1Hz. EEG data were recorded continuously and digitally stored. Description: No clear posterior dominant rhythm was seen.  EEG showed continuous generalized 5 to 7 Hz theta slowing.  Intermittent generalized periodic discharges with triphasic morphology at 1.5 to 2 Hz were also noted , more frequent when awake/stimulated. Hyperventilation and photic stimulation were not performed.   ABNORMALITY - Periodic discharges with triphasic morphology, generalized ( GPDs) - Continuous slow, generalized IMPRESSION: This study showed generalized periodic discharges with triphasic morphology at 1.5 to 2 Hz which is on the ictal-interictal continuum with increased risk for seizures.  However, the frequency, morphology and reactivity to stimulation is more commonly indicative of toxic-metabolic causes like  cefepime toxicity.  Additionally there is moderate diffuse encephalopathy, nonspecific etiology.  No definite seizures were seen during this study. If concern for ictal-interictal activity persists, please consider prolonged EEG monitoring. Lora Havens   ECHOCARDIOGRAM COMPLETE  Result Date: 06/08/2022    ECHOCARDIOGRAM REPORT   Patient Name:   [name] Date of Exam: 06/08/2022 Medical Rec #:  [id number]        Height:       70.0 in Accession #:  [id number]       Weight:       160.0 lb Date of Birth:  [birth year]        BSA:          1.898 m Patient Age:    86 years         BP:           214/126 mmHg Patient Gender: M                HR:           84 bpm. Exam Location:  Inpatient Procedure: 2D Echo, Color Doppler and Cardiac Doppler Indications:    "Discitis of multiple sites of the spine"  History:        Patient has prior history of Echocardiogram examinations, most                 recent 03/05/2014. CHF, Pacemaker, Arrythmias:Atrial                 Fibrillation; Risk Factors:Hypertension, Diabetes and                 Dyslipidemia.  Sonographer:    Raquel Sarna Senior RDCS Referring Phys: [id number] [place]  1. Left ventricular ejection fraction, by estimation, is 60 to 65%. The left ventricle has normal function. The left ventricle has no regional wall motion abnormalities. Diastolic function indeterminant due to AFib.  2. Right ventricular systolic function is mildly reduced. The right ventricular size is normal. There is normal pulmonary artery systolic pressure. The estimated right ventricular systolic pressure is 35.0 mmHg.  3. Left atrial size was severely dilated.  4. Right atrial size was mildly dilated.  5. The mitral valve is degenerative. Moderate mitral valve regurgitation. Severe mitral annular calcification.  6. Tricuspid valve regurgitation is moderate.  7. The aortic valve is tricuspid. There is moderate calcification of the aortic valve. There is moderate thickening of the  aortic valve. Aortic valve regurgitation is trivial. Aortic valve sclerosis/calcification is present, without any evidence of aortic stenosis.  8. The inferior vena cava is normal in size with greater than 50% respiratory variability, suggesting right atrial pressure of 3 mmHg.  9. No valvular vegetations visualized, however, degree of MAC makes it difficult to fully interrogate mitral valve. Recommend TEE if clinically indicated. Comparison(s): No significant change from prior study in 2015. Conclusion(s)/Recommendation(s): No evidence of valvular vegetations on this transthoracic echocardiogram. Consider a transesophageal echocardiogram to exclude infective endocarditis if clinically indicated. FINDINGS  Left Ventricle: Left ventricular ejection fraction, by estimation, is 60 to 65%. The left ventricle has normal function. The left ventricle has no regional wall motion abnormalities. The left ventricular internal cavity size was normal in size. There is  no left ventricular hypertrophy. Diastolic function indeterminant due to AFib. Right Ventricle: The right ventricular size is normal. No increase in right ventricular wall thickness. Right ventricular systolic function is mildly reduced. There is normal pulmonary artery systolic pressure. The tricuspid regurgitant velocity is 2.80 m/s, and with an assumed right atrial pressure of 3 mmHg, the estimated right ventricular systolic pressure is 09.3 mmHg. Left Atrium: Left atrial size was severely dilated. Right Atrium: Right atrial size was mildly dilated. Pericardium: There is no evidence of pericardial effusion. Mitral Valve: The mitral valve is degenerative in appearance. There is mild thickening of the mitral valve leaflet(s). There is mild calcification of the mitral valve leaflet(s). Severe mitral annular calcification. Moderate mitral valve regurgitation. MV peak gradient, 13.2  mmHg. The mean mitral valve gradient is 4.0 mmHg. Tricuspid Valve: The tricuspid  valve is normal in structure. Tricuspid valve regurgitation is moderate. Aortic Valve: The aortic valve is tricuspid. There is moderate calcification of the aortic valve. There is moderate thickening of the aortic valve. Aortic valve regurgitation is trivial. Aortic valve sclerosis/calcification is present, without any evidence of aortic stenosis. Pulmonic Valve: The pulmonic valve was normal in structure. Pulmonic valve regurgitation is trivial. Aorta: The aortic root and ascending aorta are structurally normal, with no evidence of dilitation. Venous: The inferior vena cava is normal in size with greater than 50% respiratory variability, suggesting right atrial pressure of 3 mmHg. IAS/Shunts: The atrial septum is grossly normal. Additional Comments: A device lead is visualized.  LEFT VENTRICLE PLAX 2D LVIDd:         4.55 cm   Diastology LVIDs:         2.85 cm   LV e' medial:    7.51 cm/s LV PW:         1.05 cm   LV E/e' medial:  22.6 LV IVS:        0.80 cm   LV e' lateral:   8.05 cm/s LVOT diam:     2.00 cm   LV E/e' lateral: 21.1 LV SV:         45 LV SV Index:   23 LVOT Area:     3.14 cm  RIGHT VENTRICLE RV S prime:     5.33 cm/s TAPSE (M-mode): 1.4 cm LEFT ATRIUM              Index        RIGHT ATRIUM           Index LA diam:        5.80 cm  3.06 cm/m   RA Area:     21.40 cm LA Vol (A2C):   136.0 ml 71.64 ml/m  RA Volume:   52.40 ml  27.60 ml/m LA Vol (A4C):   131.5 ml 69.27 ml/m LA Biplane Vol: 132.0 ml 69.53 ml/m  AORTIC VALVE LVOT Vmax:   66.40 cm/s LVOT Vmean:  49.200 cm/s LVOT VTI:    0.142 m  AORTA Ao Root diam: 3.40 cm Ao Asc diam:  3.30 cm MITRAL VALVE                TRICUSPID VALVE MV Area (PHT): 4.01 cm     TR Peak grad:   31.4 mmHg MV Area VTI:   1.26 cm     TR Vmax:        280.00 cm/s MV Peak grad:  13.2 mmHg MV Mean grad:  4.0 mmHg     SHUNTS MV Vmax:       1.82 m/s     Systemic VTI:  0.14 m MV Vmean:      95.5 cm/s    Systemic Diam: 2.00 cm MV Decel Time: 189 msec MV E velocity: 170.00 cm/s  MV A velocity: 73.20 cm/s MV E/A ratio:  2.32 Gwyndolyn Kaufman MD Electronically signed by Gwyndolyn Kaufman MD Signature Date/Time: 06/08/2022/3:50:54 PM    Final    CT Angio Chest PE W and/or Wo Contrast  Result Date: 06/08/2022 CLINICAL DATA:  High probability for acute pulmonary embolus. Sepsis. EXAM: CT ANGIOGRAPHY CHEST CT ABDOMEN AND PELVIS WITH CONTRAST TECHNIQUE: Multidetector CT imaging of the chest was performed using the standard protocol during bolus administration of intravenous contrast. Multiplanar CT image reconstructions and MIPs were obtained  to evaluate the vascular anatomy. Multidetector CT imaging of the abdomen and pelvis was performed using the standard protocol during bolus administration of intravenous contrast. RADIATION DOSE REDUCTION: This exam was performed according to the departmental dose-optimization program which includes automated exposure control, adjustment of the mA and/or kV according to patient size and/or use of iterative reconstruction technique. CONTRAST:  3m OMNIPAQUE IOHEXOL 350 MG/ML SOLN COMPARISON:  CT AP from 09/20/18 FINDINGS: CTA CHEST FINDINGS Cardiovascular: Satisfactory opacification of the pulmonary arteries to the segmental level. No evidence of pulmonary embolism. Mild cardiac enlargement. Aortic atherosclerosis and coronary artery calcifications. Mediastinum/Nodes: Thyroid gland and trachea are unremarkable. Mild increase caliber of the air-filled proximal esophagus is identified to the level of the carina. Below the carina there is focal dilatation of the thoracic esophagus containing food debris measuring 3.4 cm in diameter, image 87/6. This is compared with 1.3 cm above the carina. The distal esophagus just above the GE junction has a normal caliber. Underlying stricture formation cannot be excluded. No enlarged lymph nodes identified. Multiple calcified mediastinal and hilar lymph nodes are identified compatible with remote granulomatous disease.  Lungs/Pleura: No pleural effusion, airspace consolidation or signs of pneumothorax. There is mild diffuse ground-glass attenuation identified throughout both lungs. Scattered calcified granulomas are noted bilaterally. Dependent changes noted within the posterior lung bases bilaterally. Musculoskeletal: Abnormal appearance of the lower thoracic spine at the T12-L1 level. There is complete erosion of the superior endplate of TN02and much of the anterior superior aspect of the T12 vertebral body. The inferior endplate of TV25also appears eroded. There is surrounding paravertebral soft tissue stranding, image 152/6. Retropulsion bone fragments identified into the canal, image 151/6. No discrete fluid collection within the limitations of CT. Imaging findings are compatible with discitis/osteomyelitis at T11-12. Review of the MIP images confirms the above findings. CT ABDOMEN and PELVIS FINDINGS Hepatobiliary: No suspicious liver lesions. Multiple calcified granulomas noted. Gallbladder appears normal. No bile duct dilatation. Pancreas: Fatty infiltration of the pancreas. No signs of main duct dilatation, inflammation or mass. Spleen: No suspicious findings.  Numerous calcified granulomas. Adrenals/Urinary Tract: The adrenal glands are within normal limits. Bilateral renal cortical scarring and mild volume loss. Right kidney stone measures 3 mm. Benign Bosniak class 1 cyst arises off the inferior pole of the right kidney. No follow-up imaging recommended. No hydronephrosis identified bilaterally. Urinary bladder appears normal. Stomach/Bowel: Stomach is within normal limits. No bowel wall thickening, inflammation or distension. Sigmoid diverticulosis without signs of acute inflammation. Vascular/Lymphatic: Aortic atherosclerosis. No aneurysm. No signs of abdominopelvic adenopathy. Reproductive: Prostate is unremarkable. Other: No free fluid or fluid collections identified. Musculoskeletal: Postoperative changes from L4-5  hardware fixation with solid fusion noted at L3-4 and L4-5. The disc space at L2-3 is indistinct with erosive in plate changes which are also suspicious for discitis/osteomyelitis. No significant surrounding inflammatory changes noted. Review of the MIP images confirms the above findings. IMPRESSION: 1. No evidence for acute pulmonary embolus. 2. Signs of acute discitis/osteomyelitis at T11-12 with significant endplate destruction of both vertebral bodies and retropulsion of bony fragment into the ventral spinal canal. Surrounding soft tissue stranding is identified. 3. Mild discitis/osteomyelitis is also suspected at the L2-3 level. Here, the disc space is indistinct with mild erosive changes involving the endplates. No significant surrounding inflammatory fat stranding. 4. Mild diffuse ground-glass attenuation identified throughout both lungs which may reflect mild pulmonary edema. 5. There is abnormal dilatation of the proximal and midthoracic esophagus. This is most dilated just below the level  of the carina. The distal segment of the esophagus has a normal caliber. Cannot exclude distal esophageal stricture. 6. Sequelae of remote granulomatous disease. 7. Nonobstructing right renal calculus. 8. Sigmoid diverticulosis without signs of acute inflammation. 9. Aortic Atherosclerosis (ICD10-I70.0). Critical Value/emergent results were called by telephone at the time of interpretation on 06/08/2022 at 12:44 pm to provider MADISON [facility] , who verbally acknowledged these results. Electronically Signed   By: Kerby Moors M.D.   On: 06/08/2022 12:45   CT ABDOMEN PELVIS W CONTRAST  Result Date: 06/08/2022 CLINICAL DATA:  High probability for acute pulmonary embolus. Sepsis. EXAM: CT ANGIOGRAPHY CHEST CT ABDOMEN AND PELVIS WITH CONTRAST TECHNIQUE: Multidetector CT imaging of the chest was performed using the standard protocol during bolus administration of intravenous contrast. Multiplanar CT image reconstructions  and MIPs were obtained to evaluate the vascular anatomy. Multidetector CT imaging of the abdomen and pelvis was performed using the standard protocol during bolus administration of intravenous contrast. RADIATION DOSE REDUCTION: This exam was performed according to the departmental dose-optimization program which includes automated exposure control, adjustment of the mA and/or kV according to patient size and/or use of iterative reconstruction technique. CONTRAST:  72m OMNIPAQUE IOHEXOL 350 MG/ML SOLN COMPARISON:  CT AP from 09/20/18 FINDINGS: CTA CHEST FINDINGS Cardiovascular: Satisfactory opacification of the pulmonary arteries to the segmental level. No evidence of pulmonary embolism. Mild cardiac enlargement. Aortic atherosclerosis and coronary artery calcifications. Mediastinum/Nodes: Thyroid gland and trachea are unremarkable. Mild increase caliber of the air-filled proximal esophagus is identified to the level of the carina. Below the carina there is focal dilatation of the thoracic esophagus containing food debris measuring 3.4 cm in diameter, image 87/6. This is compared with 1.3 cm above the carina. The distal esophagus just above the GE junction has a normal caliber. Underlying stricture formation cannot be excluded. No enlarged lymph nodes identified. Multiple calcified mediastinal and hilar lymph nodes are identified compatible with remote granulomatous disease. Lungs/Pleura: No pleural effusion, airspace consolidation or signs of pneumothorax. There is mild diffuse ground-glass attenuation identified throughout both lungs. Scattered calcified granulomas are noted bilaterally. Dependent changes noted within the posterior lung bases bilaterally. Musculoskeletal: Abnormal appearance of the lower thoracic spine at the T12-L1 level. There is complete erosion of the superior endplate of TZ61and much of the anterior superior aspect of the T12 vertebral body. The inferior endplate of TW96also appears eroded.  There is surrounding paravertebral soft tissue stranding, image 152/6. Retropulsion bone fragments identified into the canal, image 151/6. No discrete fluid collection within the limitations of CT. Imaging findings are compatible with discitis/osteomyelitis at T11-12. Review of the MIP images confirms the above findings. CT ABDOMEN and PELVIS FINDINGS Hepatobiliary: No suspicious liver lesions. Multiple calcified granulomas noted. Gallbladder appears normal. No bile duct dilatation. Pancreas: Fatty infiltration of the pancreas. No signs of main duct dilatation, inflammation or mass. Spleen: No suspicious findings.  Numerous calcified granulomas. Adrenals/Urinary Tract: The adrenal glands are within normal limits. Bilateral renal cortical scarring and mild volume loss. Right kidney stone measures 3 mm. Benign Bosniak class 1 cyst arises off the inferior pole of the right kidney. No follow-up imaging recommended. No hydronephrosis identified bilaterally. Urinary bladder appears normal. Stomach/Bowel: Stomach is within normal limits. No bowel wall thickening, inflammation or distension. Sigmoid diverticulosis without signs of acute inflammation. Vascular/Lymphatic: Aortic atherosclerosis. No aneurysm. No signs of abdominopelvic adenopathy. Reproductive: Prostate is unremarkable. Other: No free fluid or fluid collections identified. Musculoskeletal: Postoperative changes from L4-5 hardware fixation with solid fusion  noted at L3-4 and L4-5. The disc space at L2-3 is indistinct with erosive in plate changes which are also suspicious for discitis/osteomyelitis. No significant surrounding inflammatory changes noted. Review of the MIP images confirms the above findings. IMPRESSION: 1. No evidence for acute pulmonary embolus. 2. Signs of acute discitis/osteomyelitis at T11-12 with significant endplate destruction of both vertebral bodies and retropulsion of bony fragment into the ventral spinal canal. Surrounding soft tissue  stranding is identified. 3. Mild discitis/osteomyelitis is also suspected at the L2-3 level. Here, the disc space is indistinct with mild erosive changes involving the endplates. No significant surrounding inflammatory fat stranding. 4. Mild diffuse ground-glass attenuation identified throughout both lungs which may reflect mild pulmonary edema. 5. There is abnormal dilatation of the proximal and midthoracic esophagus. This is most dilated just below the level of the carina. The distal segment of the esophagus has a normal caliber. Cannot exclude distal esophageal stricture. 6. Sequelae of remote granulomatous disease. 7. Nonobstructing right renal calculus. 8. Sigmoid diverticulosis without signs of acute inflammation. 9. Aortic Atherosclerosis (ICD10-I70.0). Critical Value/emergent results were called by telephone at the time of interpretation on 06/08/2022 at 12:44 pm to provider MADISON [facility] , who verbally acknowledged these results. Electronically Signed   By: Kerby Moors M.D.   On: 06/08/2022 12:45   CT Head Wo Contrast  Result Date: 06/08/2022 CLINICAL DATA:  Delirium.  Altered mental status. EXAM: CT HEAD WITHOUT CONTRAST TECHNIQUE: Contiguous axial images were obtained from the base of the skull through the vertex without intravenous contrast. RADIATION DOSE REDUCTION: This exam was performed according to the departmental dose-optimization program which includes automated exposure control, adjustment of the mA and/or kV according to patient size and/or use of iterative reconstruction technique. COMPARISON:  07/22/2021 FINDINGS: Brain: There is no evidence for acute hemorrhage, hydrocephalus, mass lesion, or abnormal extra-axial fluid collection. No definite CT evidence for acute infarction. Diffuse loss of parenchymal volume is consistent with atrophy. Patchy low attenuation in the deep hemispheric and periventricular white matter is nonspecific, but likely reflects chronic microvascular ischemic  demyelination. Tiny lacunar infarcts in the basal ganglia bilaterally. Right cerebellar lacunar infarct is stable in the interval. Vascular: No hyperdense vessel or unexpected calcification. Skull: No evidence for fracture. No worrisome lytic or sclerotic lesion. Sinuses/Orbits: The visualized paranasal sinuses and mastoid air cells are clear. Visualized portions of the globes and intraorbital fat are unremarkable. Other: None. IMPRESSION: 1. No acute intracranial abnormality. 2. Atrophy with chronic small vessel ischemic disease. Electronically Signed   By: Misty Stanley M.D.   On: 06/08/2022 12:12   DG Chest Portable 1 View  Result Date: 06/08/2022 CLINICAL DATA:  Shortness of breath. Hypoxia. Altered mental status. EXAM: PORTABLE CHEST 1 VIEW COMPARISON:  03/06/2017 FINDINGS: Chronic cardiomegaly. Pacemaker with lead in the region of the right ventricle. The lungs are clear without infiltrate, collapse or effusion. Few scattered calcified granulomas. IMPRESSION: No active disease.  Cardiomegaly and pacemaker. Electronically Signed   By: Nelson Chimes M.D.   On: 06/08/2022 10:28      IMPRESSION/PLAN:***    On date of service, in total, I spent *** minutes on this encounter. Patient was seen in person.   __________________________________________   Eppie Gibson, MD  This document serves as a record of services personally performed by Eppie Gibson, MD. It was created on her behalf by Roney Mans, a trained medical scribe. The creation of this record is based on the scribe's personal observations and the provider's statements to them. This  document has been checked and approved by the attending provider.

## 2022-07-05 NOTE — Progress Notes (Signed)
Histology and Location of Primary Skin Cancer:  Squamous cell carcinoma right superior helix and lateral external helix  Synthia Innocent presented with the following signs/symptoms: ***  Past/Anticipated interventions by patient's surgeon/dermatologist for current problematic lesion, if any:  05/31/2022 --Dr. Griselda Miner   Past skin cancers, if any:   History of Blistering sunburns, if any: Significant history of sun exposure  SAFETY ISSUES: Prior radiation? Yes: 06/16/2014 through 07/25/2014 SITE/DOSE: Scalp 6000 cGy in 30 sessions  BEAMS/ENERGY: 6 MEV electrons with 0.8 cm bolus to maximize the dose to the skin surface.   Pacemaker/ICD? Yes: 06/22/2011 (Dr. Rayann Heman) Possible current pregnancy? [name] Is the patient on methotrexate? ***  Current Complaints / other details:  ***Currently residing at [facility]. History of AKA in 2015 by Dr. Sharol Given

## 2022-07-05 NOTE — Patient Outreach (Signed)
[facility] Post- Acute Care Coordinator follow up. Mr. Douthat resides in [facility]. Screening for care coordination/care management needs as a benefit of insurance plan and PCP.  PCP office [place] [facility] has [facility] care management services.   Met with Miyah, SNF SW and Ana, Brewing technologist at [facility] to discuss transition plans and progression. Mr. Mcphillips is participating with therapy. Continues with iv antibiotics until 07/20/22. Mr. Birchall is from [place] with spouse. Unsure if he will be able to return. Daughter previously applied for Medicaid in case Mr. Bulluck unable to return home.   Will continue to follow while in SNF.    Marthenia Rolling, MSN, RN,BSN [place] Acute Care Coordinator [phone] [facility]) [phone]  (Toll free office)

## 2022-07-06 ENCOUNTER — Ambulatory Visit: Payer: Medicare Other | Admitting: Radiation Oncology

## 2022-07-06 ENCOUNTER — Ambulatory Visit
Admission: RE | Admit: 2022-07-06 | Discharge: 2022-07-06 | Disposition: A | Discharge status: Home / Self Care | Payer: Medicare Other | Source: Ambulatory Visit | Attending: Radiation Oncology | Admitting: Radiation Oncology

## 2022-07-06 ENCOUNTER — Ambulatory Visit: Payer: Medicare Other

## 2022-07-06 ENCOUNTER — Other Ambulatory Visit: Payer: Self-pay

## 2022-07-06 VITALS — BP 149/63 | HR 70 | Temp 98.6°F | Resp 16 | Ht 69.0 in

## 2022-07-06 DIAGNOSIS — I509 Heart failure, unspecified: Secondary | ICD-10-CM | POA: Diagnosis not present

## 2022-07-06 DIAGNOSIS — I480 Paroxysmal atrial fibrillation: Secondary | ICD-10-CM | POA: Diagnosis not present

## 2022-07-06 DIAGNOSIS — N200 Calculus of kidney: Secondary | ICD-10-CM | POA: Insufficient documentation

## 2022-07-06 DIAGNOSIS — E162 Hypoglycemia, unspecified: Secondary | ICD-10-CM | POA: Diagnosis not present

## 2022-07-06 DIAGNOSIS — Z86010 Personal history of colonic polyps: Secondary | ICD-10-CM | POA: Insufficient documentation

## 2022-07-06 DIAGNOSIS — I700 Atherosclerosis of aorta: Secondary | ICD-10-CM | POA: Insufficient documentation

## 2022-07-06 DIAGNOSIS — Z87891 Personal history of nicotine dependence: Secondary | ICD-10-CM | POA: Insufficient documentation

## 2022-07-06 DIAGNOSIS — R5383 Other fatigue: Secondary | ICD-10-CM | POA: Diagnosis not present

## 2022-07-06 DIAGNOSIS — M4646 Discitis, unspecified, lumbar region: Secondary | ICD-10-CM | POA: Diagnosis not present

## 2022-07-06 DIAGNOSIS — I4891 Unspecified atrial fibrillation: Secondary | ICD-10-CM | POA: Insufficient documentation

## 2022-07-06 DIAGNOSIS — G9341 Metabolic encephalopathy: Secondary | ICD-10-CM | POA: Diagnosis not present

## 2022-07-06 DIAGNOSIS — E119 Type 2 diabetes mellitus without complications: Secondary | ICD-10-CM | POA: Diagnosis not present

## 2022-07-06 DIAGNOSIS — K219 Gastro-esophageal reflux disease without esophagitis: Secondary | ICD-10-CM | POA: Insufficient documentation

## 2022-07-06 DIAGNOSIS — L570 Actinic keratosis: Secondary | ICD-10-CM | POA: Insufficient documentation

## 2022-07-06 DIAGNOSIS — C44222 Squamous cell carcinoma of skin of right ear and external auricular canal: Secondary | ICD-10-CM | POA: Insufficient documentation

## 2022-07-06 DIAGNOSIS — Z79899 Other long term (current) drug therapy: Secondary | ICD-10-CM | POA: Insufficient documentation

## 2022-07-06 DIAGNOSIS — Z7901 Long term (current) use of anticoagulants: Secondary | ICD-10-CM | POA: Diagnosis not present

## 2022-07-06 DIAGNOSIS — E1169 Type 2 diabetes mellitus with other specified complication: Secondary | ICD-10-CM | POA: Diagnosis not present

## 2022-07-06 DIAGNOSIS — G4700 Insomnia, unspecified: Secondary | ICD-10-CM | POA: Diagnosis not present

## 2022-07-06 DIAGNOSIS — Z510 Encounter for antineoplastic radiation therapy: Secondary | ICD-10-CM | POA: Diagnosis not present

## 2022-07-06 DIAGNOSIS — M25551 Pain in right hip: Secondary | ICD-10-CM | POA: Diagnosis not present

## 2022-07-06 DIAGNOSIS — E785 Hyperlipidemia, unspecified: Secondary | ICD-10-CM | POA: Diagnosis not present

## 2022-07-06 DIAGNOSIS — M869 Osteomyelitis, unspecified: Secondary | ICD-10-CM | POA: Diagnosis not present

## 2022-07-06 DIAGNOSIS — I10 Essential (primary) hypertension: Secondary | ICD-10-CM | POA: Insufficient documentation

## 2022-07-06 DIAGNOSIS — I5032 Chronic diastolic (congestive) heart failure: Secondary | ICD-10-CM | POA: Diagnosis not present

## 2022-07-06 DIAGNOSIS — M6281 Muscle weakness (generalized): Secondary | ICD-10-CM | POA: Diagnosis not present

## 2022-07-06 NOTE — Progress Notes (Signed)
Oncology Nurse Navigator Documentation   Met with patient during initial consult with Dr. Isidore Moos. I introduced myself as his Navigator, explained my role as a member of the Care Team. Assisted with post-consult appt scheduling. He verbalized understanding of information provided. He proceeded to CT simulation today. He tolerated without difficulty.  I provided him with my direct contact information and encouraged him to call with questions/concerns moving forward.  Harlow Asa, RN, BSN, OCN Head & Neck Oncology Nurse [place] [facility] [place] [phone]

## 2022-07-08 DIAGNOSIS — I5032 Chronic diastolic (congestive) heart failure: Secondary | ICD-10-CM | POA: Diagnosis not present

## 2022-07-08 DIAGNOSIS — M6281 Muscle weakness (generalized): Secondary | ICD-10-CM | POA: Diagnosis not present

## 2022-07-08 DIAGNOSIS — E785 Hyperlipidemia, unspecified: Secondary | ICD-10-CM | POA: Diagnosis not present

## 2022-07-08 DIAGNOSIS — M4646 Discitis, unspecified, lumbar region: Secondary | ICD-10-CM | POA: Diagnosis not present

## 2022-07-08 DIAGNOSIS — M869 Osteomyelitis, unspecified: Secondary | ICD-10-CM | POA: Diagnosis not present

## 2022-07-08 DIAGNOSIS — I480 Paroxysmal atrial fibrillation: Secondary | ICD-10-CM | POA: Diagnosis not present

## 2022-07-08 DIAGNOSIS — E1169 Type 2 diabetes mellitus with other specified complication: Secondary | ICD-10-CM | POA: Diagnosis not present

## 2022-07-08 DIAGNOSIS — G9341 Metabolic encephalopathy: Secondary | ICD-10-CM | POA: Diagnosis not present

## 2022-07-09 NOTE — Progress Notes (Unsigned)
Cardiology Office Note Date:  07/11/2022  Patient ID:  [name], [name] 1935-10-28, MRN [id number] PCP:  Seward Carol, MD  Electrophysiologist: Dr. Caryl Comes    Chief Complaint: annual visit  History of Present Illness: [name] is a 87 y.o. male with history of Afib, stroke/TIA (?), HTN, tachy-brady w/PPM, DM, CKD (IIIb), R AKA (2/2 gangrene foot, the pt reports initially was above ankle, had a number of surgeries, ended up AKA)  He comes in today to be seen for Dr. Caryl Comes, last seen by him Aug 2019, Afib described as permanent, pacer functioning normal without changes made.  He has seen Jonni Sanger since then, last in June 2022, he described a TIA-like event in May, phoned his PMD without response but did not seek attention otherwise, had no ongoing or recrrent symptoms. Referred to neurology and planned for CT head Noted event of noise 04/20/21, not reproducible AFib described as paroxysmal  Admitted 06/08/22 - 06/15/22 with confusion/AMS, found with acute discitis/OM of the thoracic/lumbar spine, reported unable to do MRI 2/2 pacer, ID and neurology on board. Bizzar behavior on admission of unclear etiology, and spontaneously resolved, neg EEG and CT. Maintained on eliquis Had heel ulcer and an incidental finding of esophageal stricture, planned for outpt GI eval. BC x2 this admission were negative for 5 days  Has been found with squamous cell ca R ear, planned for XRT rather then surgical approach to treat.  NO REMOTES  TODAY He is currently at [facility] since his hospitalization He anticipates getting back home though, once antibiotics and rehab is completed. He denies any CP, palpitations or cardiac awareness, no pacer concerns. Denies any SOB No near syncope or syncope. No bleeding or signs of bleeding  I discussed eliquis dose, given his age, weight, Creat to be reduced to 2.81m BID, he reports that he thought he had been that his PMD many months ago reduced his  dose.  Discussed upcoming XRT R ear, he reports that he has had a number of XRT therapies already on different areas of his scalp before, his oncologist well ware of his pacer and has not been an issue or concern previously    Device information Abbott dual chamber PPM implanted 07/08/2011   Past Medical History:  Diagnosis Date   Atrial fibrillation ([facility]    permanent   Backache, unspecified    Cellulitis and abscess of leg, except foot    CHF (congestive heart failure) ([place]    Diverticulosis 05-2006   Dr. KDeatra Ina   Esophageal stricture    Fatigue    GERD (gastroesophageal reflux disease)    Gout, unspecified    Hx of colonic polyps 05-2006   (Adenomatous)Dr. KDeatra Ina  Hx of radiation therapy 06/16/14- 07/18/14   mult squamous cell carcinomas of scalp   Hypertension    Long term (current) use of anticoagulants    Overweight(278.02)    Pacemaker- [name] 02/20/2013   Phlebitis and thrombophlebitis of superficial vessels of lower extremities    Right bundle branch block    Skin cancer    squamous cell carcinoma scalp   Syncope and collapse    Tachycardia-bradycardia ([facility]    s/p PPM   Type II or unspecified type diabetes mellitus without mention of complication, not stated as uncontrolled    Unspecified venous (peripheral) insufficiency     Past Surgical History:  Procedure Laterality Date   ABOVE KNEE LEG AMPUTATION Right 04/02/2014   DR DUDA   AMPUTATION Right  01/20/2014   Procedure: AMPUTATION BELOW KNEE- right;  Surgeon: [name], MD;  Location: [place];  Service: Orthopedics;  Laterality: Right;  Right Below Knee Amputation   AMPUTATION Right 02/14/2014   Procedure: AMPUTATION BELOW KNEE;  Surgeon: [name], MD;  Location: [place];  Service: Orthopedics;  Laterality: Right;  Right Below Knee Amputation Revision   AMPUTATION Right 03/07/2014   Procedure: AMPUTATION BELOW KNEE;  Surgeon: [name], MD;  Location: [place];  Service: Orthopedics;  Laterality: Right;   Right Below Knee Amputation Revision   AMPUTATION Right 03/18/2014   Procedure: revision of  AMPUTATION BELOW KNEE;  Surgeon: [name], MD;  Location: [place];  Service: Orthopedics;  Laterality: Right;  Revision Right Below Knee Amputation   AMPUTATION Right 04/02/2014   Procedure: AMPUTATION ABOVE KNEE;  Surgeon: [name], MD;  Location: [place];  Service: Orthopedics;  Laterality: Right;  Right Above Knee Amputation   EYE SURGERY     I & D EXTREMITY Right 01/17/2014   Procedure: IRRIGATION AND DEBRIDEMENT RIGHT HEEL  WITH CULTURES AND BONE BIOPSY, placement of wound vac;  Surgeon: Theodoro Kos, DO;  Location: [place];  Service: Plastics;  Laterality: Right;   LAMINECTOMY     LUMBAR FUSION     PACEMAKER INSERTION  8/12   SJM by [name] for tachy/brady syndrome   ROTATOR CUFF REPAIR     ruptured rt rectus muscle     SKIN BIOPSY     scalp    Current Outpatient Medications  Medication Sig Dispense Refill   ACCU-CHEK SOFTCLIX LANCETS lancets Use to check blood sugar three times daily. Dx E11.9 300 each 3   apixaban (ELIQUIS) 5 MG TABS tablet Take 5 mg by mouth 2 (two) times daily.      B-D ULTRAFINE III SHORT PEN 31G X 8 MM MISC USE AS DIRECTED 100 each 11   baclofen (LIORESAL) 10 MG tablet Take 10 mg by mouth every 8 (eight) hours.     Blood Glucose Calibration (ACCU-CHEK AVIVA) SOLN Use as Directed. Dx E11.9 3 each 3   cefTRIAXone (ROCEPHIN) IVPB Inject 2 g into the vein daily. Indication:  Possible discitis/osteomyelitis  First Dose: Yes Last Day of Therapy:  07/20/22  Labs - Once weekly:  CBC/D and BMP, Labs - Every other week:  ESR and CRP 37 Units 0   daptomycin (CUBICIN) IVPB Inject 500 mg into the vein every other day. Indication:  Possible discitis/osteomyelitis  First Dose: Yes Last Day of Therapy:  07/20/22  Labs - Once weekly:  CBC/D, BMP, and CPK Labs - Every other week:  ESR and CRP 18 Units 0   diltiazem (CARDIZEM CD) 180 MG 24 hr capsule Take 180 mg by mouth daily.      furosemide (LASIX) 20 MG tablet Take 20 mg by mouth every other day.     gabapentin (NEURONTIN) 100 MG capsule Take 100 mg by mouth 2 (two) times daily.     glucose blood (ACCU-CHEK AVIVA PLUS) test strip DX E11.9, check blood sugar three times daily 300 each 11   insulin aspart (NOVOLOG) 100 UNIT/ML injection Inject 70-120 Units into the skin 3 (three) times daily before meals.     insulin glargine (LANTUS) 100 unit/mL SOPN Inject 10 Units into the skin at bedtime.     melatonin 5 MG TABS Take 5 mg by mouth at bedtime.     Multiple Vitamins-Minerals (MULTIVITAMIN PO) Take 1 tablet by mouth  daily. Takes a Men's 50 Plus Multivitamin     omeprazole (PRILOSEC) 20 MG capsule Take 20 mg by mouth daily.     No current facility-administered medications for this visit.    Allergies:   Morphine and Statins   Social History:  The patient  reports that he has quit smoking. He has never used smokeless tobacco. He reports that he does not drink alcohol and does not use drugs.   Family History:  The patient's family history includes Diabetes in his father; Heart disease in his father; Pneumonia in his mother.  ROS:  Please see the history of present illness.    All other systems are reviewed and otherwise negative.   PHYSICAL EXAM:  VS:  BP (!) 136/58   Pulse 88   Ht _0  (1.727 m)   Wt 131 lb 6.4 oz (59.6 kg)   SpO2 97%   BMI 19.98 kg/m  BMI: Body mass index is 19.98 kg/m. Well nourished, well developed, in no acute distress HEENT: normocephalic, atraumatic Neck: no JVD, carotid bruits or masses Cardiac:  irreg-irreg; no significant murmurs, no rubs, or gallops Lungs:  CTA b/l, no wheezing, rhonchi or rales Abd: soft, nontender MS: no deformity, advanced atrophy Ext: R AKA, LLE has chronic looking skin changes/scaly, no edema Skin: warm and dry, no rash Neuro:  No gross deficits appreciated Psych: euthymic mood, full affect  PPM site is stable, no tethering or discomfort, no  thinning or erosion, pocket looks OK   EKG:  not done today  Device interrogation done today and reviewed by myself:  Battery and lead measurements are good + V noise (2), reproducible with small noise signal only with hand rubbing  HVE are irregular and felt to be RVR HR histograms look ok  06/08/2022: TTE  1. Left ventricular ejection fraction, by estimation, is 60 to 65%. The  left ventricle has normal function. The left ventricle has no regional  wall motion abnormalities. Diastolic function indeterminant due to AFib.   2. Right ventricular systolic function is mildly reduced. The right  ventricular size is normal. There is normal pulmonary artery systolic  pressure. The estimated right ventricular systolic pressure is 32.4 mmHg.   3. Left atrial size was severely dilated.   4. Right atrial size was mildly dilated.   5. The mitral valve is degenerative. Moderate mitral valve regurgitation.  Severe mitral annular calcification.   6. Tricuspid valve regurgitation is moderate.   7. The aortic valve is tricuspid. There is moderate calcification of the  aortic valve. There is moderate thickening of the aortic valve. Aortic  valve regurgitation is trivial. Aortic valve sclerosis/calcification is  present, without any evidence of  aortic stenosis.   8. The inferior vena cava is normal in size with greater than 50%  respiratory variability, suggesting right atrial pressure of 3 mmHg.   9. No valvular vegetations visualized, however, degree of MAC makes it  difficult to fully interrogate mitral valve. Recommend TEE if clinically  indicated.   Comparison(s): No significant change from prior study in 2015.    Recent Labs: 06/08/2022: ALT 14; B Natriuretic Peptide 215.4; TSH 3.461 06/11/2022: BUN 25; Creatinine, Ser 1.82; Potassium 3.7; Sodium 132 06/15/2022: Hemoglobin 10.6; Platelets 164  No results found for requested labs within last 365 days.   CrCl cannot be calculated (Patient's  most recent lab result is older than the maximum 21 days allowed.).   Wt Readings from Last 3 Encounters:  07/11/22 131 lb 6.4 oz (  59.6 kg)  06/15/22 136 lb 0.4 oz (61.7 kg)  04/28/21 167 lb (75.8 kg)     Other studies reviewed: Additional studies/records reviewed today include: summarized above  ASSESSMENT AND PLAN:  PPM RV noise, stable measurements VP 18%   He would like to get enrolled in remotes, has a transmitter at home, but has never worked Will have him back in Nov, he anticipates getting home by October, to try and get him on board with remotes  Permanent Afib CHA2DS2Vasc is at least 4 with uncertain hx of TIA?stroke (would be 7), on eliquis,  Reduce Eliquis to 2.21m BID controlled rates  HTN Looks OK  Disposition: F/u with Nov, Dr. KCaryl Comes sooner if needed  Current medicines are reviewed at length with the patient today.  The patient did not have any concerns regarding medicines.  SVenetia Night PA-C 07/11/2022 11:36 AM     [facility] [address] [place] [phone])  [phone](fax)

## 2022-07-11 ENCOUNTER — Encounter: Payer: Self-pay | Admitting: Physician Assistant

## 2022-07-11 ENCOUNTER — Ambulatory Visit: Payer: Medicare Other | Admitting: Neurology

## 2022-07-11 ENCOUNTER — Ambulatory Visit (INDEPENDENT_AMBULATORY_CARE_PROVIDER_SITE_OTHER): Payer: Medicare Other | Admitting: Physician Assistant

## 2022-07-11 VITALS — BP 136/58 | HR 88 | Ht 68.0 in | Wt 131.4 lb

## 2022-07-11 DIAGNOSIS — Z510 Encounter for antineoplastic radiation therapy: Secondary | ICD-10-CM | POA: Diagnosis not present

## 2022-07-11 DIAGNOSIS — I4821 Permanent atrial fibrillation: Secondary | ICD-10-CM

## 2022-07-11 DIAGNOSIS — I10 Essential (primary) hypertension: Secondary | ICD-10-CM

## 2022-07-11 DIAGNOSIS — Z950 Presence of cardiac pacemaker: Secondary | ICD-10-CM | POA: Diagnosis not present

## 2022-07-11 DIAGNOSIS — C44222 Squamous cell carcinoma of skin of right ear and external auricular canal: Secondary | ICD-10-CM | POA: Diagnosis not present

## 2022-07-11 LAB — CUP PACEART INCLINIC DEVICE CHECK
Battery Remaining Longevity: 40 mo
Battery Voltage: 2.9 V
Brady Statistic RV Percent Paced: 18 %
Date Time Interrogation Session: 20230821125157
Implantable Lead Implant Date: 20120817
Implantable Lead Location: 753860
Implantable Lead Model: 1948
Implantable Pulse Generator Implant Date: 20120817
Lead Channel Impedance Value: 400 Ohm
Lead Channel Pacing Threshold Amplitude: 0.75 V
Lead Channel Pacing Threshold Amplitude: 0.75 V
Lead Channel Pacing Threshold Pulse Width: 0.6 ms
Lead Channel Pacing Threshold Pulse Width: 0.6 ms
Lead Channel Sensing Intrinsic Amplitude: 10 mV
Lead Channel Setting Pacing Amplitude: 2 V
Lead Channel Setting Pacing Pulse Width: 0.6 ms
Lead Channel Setting Sensing Sensitivity: 2 mV
Pulse Gen Model: 1210
Pulse Gen Serial Number: 7244744

## 2022-07-11 NOTE — Patient Instructions (Addendum)
Medication Instructions:   START TAKING AND MAKING SURE YOUR ARE TAKING :  ELIQUIS 2.5 MG TWICE A DAY   *If you need a refill on your cardiac medications before your next appointment, please call your pharmacy*   Lab Work: NONE ORDERED  TODAY   If you have labs (blood work) drawn today and your tests are completely normal, you will receive your results only by: Ali Molina (if you have MyChart) OR A paper copy in the mail If you have any lab test that is abnormal or we need to change your treatment, we will call you to review the results.   Testing/Procedures: NONE ORDERED  TODAY    Follow-Up: At [facility], you and your health needs are our priority.  As part of our continuing mission to provide you with exceptional heart care, we have created designated Provider Care Teams.  These Care Teams include your primary Cardiologist (physician) and Advanced Practice Providers (APPs -  Physician Assistants and Nurse Practitioners) who all work together to provide you with the care you need, when you need it.  We recommend signing up for the patient portal called "MyChart".  Sign up information is provided on this After Visit Summary.  MyChart is used to connect with patients for Virtual Visits (Telemedicine).  Patients are able to view lab/test results, encounter notes, upcoming appointments, etc.  Non-urgent messages can be sent to your provider as well.   To learn more about what you can do with MyChart, go to [email or web address].    Your next appointment:   3 month(s)  The format for your next appointment:   In Person  Provider:   Virl Axe, MD    Other Instructions  Important Information About Sugar

## 2022-07-12 ENCOUNTER — Ambulatory Visit: Payer: Medicare Other | Admitting: Neurology

## 2022-07-12 ENCOUNTER — Encounter: Payer: Self-pay | Admitting: Internal Medicine

## 2022-07-12 ENCOUNTER — Other Ambulatory Visit: Payer: Self-pay | Admitting: *Deleted

## 2022-07-12 ENCOUNTER — Other Ambulatory Visit: Payer: Self-pay

## 2022-07-12 ENCOUNTER — Ambulatory Visit (INDEPENDENT_AMBULATORY_CARE_PROVIDER_SITE_OTHER): Payer: Medicare Other | Admitting: Internal Medicine

## 2022-07-12 VITALS — BP 139/79 | HR 94 | Temp 98.2°F | Resp 16 | Ht 68.0 in | Wt 131.4 lb

## 2022-07-12 DIAGNOSIS — M25552 Pain in left hip: Secondary | ICD-10-CM | POA: Diagnosis not present

## 2022-07-12 DIAGNOSIS — Z452 Encounter for adjustment and management of vascular access device: Secondary | ICD-10-CM | POA: Diagnosis not present

## 2022-07-12 DIAGNOSIS — M4646 Discitis, unspecified, lumbar region: Secondary | ICD-10-CM | POA: Diagnosis not present

## 2022-07-12 NOTE — Progress Notes (Signed)
Received call from [place]. They will obtain xrays ordered today in the office in the facility and fax results to [phone]

## 2022-07-12 NOTE — Assessment & Plan Note (Signed)
Will check an xray of his hip to assure no issues evident.

## 2022-07-12 NOTE — Assessment & Plan Note (Signed)
picc line working ok and no concerns.  Ok to remove after last dose 8/30

## 2022-07-12 NOTE — Progress Notes (Signed)
   Subjective:    Patient ID: [name], male    DOB: 1934/12/06, 87 y.o.   MRN: [id number]  HPI Here for follow up of discitis/osteomyelitis He was hospitalized last month for low back pain he had for about 2 weeks and CT imaging noted T11-12 discitis/osteomyelitis and at L2-3.  There were no positive cultures and he was started on empiric antibiotics with daptomycin and ceftriaxone and is currently at [facility].  No labs have been sent for review.  Plan for 6 weeks of antibiotics through 07/20/22.   His back pain is improved though having some new left hip pain over the last 1-2 weeks.  He is moving more though overall.    Review of Systems  Constitutional:  Negative for chills, fatigue and fever.  Gastrointestinal:  Negative for diarrhea and nausea.  Skin:  Negative for rash.       Objective:   Physical Exam Eyes:     General: No scleral icterus. Pulmonary:     Effort: Pulmonary effort is normal.  Skin:    Findings: No rash.  Neurological:     Mental Status: He is alert.  Psychiatric:        Mood and Affect: Mood normal.   SH: currently residing at [facility]; initially came from home        Assessment & Plan:

## 2022-07-12 NOTE — Assessment & Plan Note (Signed)
Overall he seems to clinically be improving with no new concerns or issues.  He is moving more and needing less pain medication, now off opioid medications.  At this point, he will continue with his treatment plan to finish with antibiotics 8/30 then stop I will have him follow up with me then in about 3 weeks as a final check to be sure he is stable from a back standpoint.

## 2022-07-12 NOTE — Patient Outreach (Signed)
[facility] Post- Acute Care Coordinator follow up.  Mr. Hlavac resides in [facility]. Met with Ana, [facility] therapy manager who reports Mr. Sabel's transition plan is to return home with spouse.   PCP office [facility] at [facility] has [facility] care management. Will plan to send secure notification of SNF discharge.    Atika Hall, MSN, RN,BSN [facility] Post Acute Care Coordinator [phone] ( Business Mobile) [phone]  (Toll free office)  

## 2022-07-13 ENCOUNTER — Ambulatory Visit
Admission: RE | Admit: 2022-07-13 | Discharge: 2022-07-13 | Disposition: A | Discharge status: Home / Self Care | Payer: Medicare Other | Source: Ambulatory Visit | Attending: Radiation Oncology | Admitting: Radiation Oncology

## 2022-07-13 ENCOUNTER — Other Ambulatory Visit: Payer: Self-pay

## 2022-07-13 ENCOUNTER — Ambulatory Visit: Payer: Medicare Other | Admitting: Radiation Oncology

## 2022-07-13 DIAGNOSIS — M4646 Discitis, unspecified, lumbar region: Secondary | ICD-10-CM | POA: Diagnosis not present

## 2022-07-13 DIAGNOSIS — G9341 Metabolic encephalopathy: Secondary | ICD-10-CM | POA: Diagnosis not present

## 2022-07-13 DIAGNOSIS — C44222 Squamous cell carcinoma of skin of right ear and external auricular canal: Secondary | ICD-10-CM | POA: Diagnosis not present

## 2022-07-13 DIAGNOSIS — I5032 Chronic diastolic (congestive) heart failure: Secondary | ICD-10-CM | POA: Diagnosis not present

## 2022-07-13 DIAGNOSIS — M6281 Muscle weakness (generalized): Secondary | ICD-10-CM | POA: Diagnosis not present

## 2022-07-13 DIAGNOSIS — E785 Hyperlipidemia, unspecified: Secondary | ICD-10-CM | POA: Diagnosis not present

## 2022-07-13 DIAGNOSIS — E1169 Type 2 diabetes mellitus with other specified complication: Secondary | ICD-10-CM | POA: Diagnosis not present

## 2022-07-13 DIAGNOSIS — I480 Paroxysmal atrial fibrillation: Secondary | ICD-10-CM | POA: Diagnosis not present

## 2022-07-13 DIAGNOSIS — Z510 Encounter for antineoplastic radiation therapy: Secondary | ICD-10-CM | POA: Diagnosis not present

## 2022-07-13 DIAGNOSIS — M869 Osteomyelitis, unspecified: Secondary | ICD-10-CM | POA: Diagnosis not present

## 2022-07-13 LAB — RAD ONC ARIA SESSION SUMMARY
Course Elapsed Days: 0
Plan Fractions Treated to Date: 1
Plan Prescribed Dose Per Fraction: 2.5 Gy
Plan Total Fractions Prescribed: 20
Plan Total Prescribed Dose: 50 Gy
Reference Point Dosage Given to Date: 2.5 Gy
Reference Point Session Dosage Given: 2.5 Gy
Session Number: 1

## 2022-07-14 ENCOUNTER — Ambulatory Visit
Admission: RE | Admit: 2022-07-14 | Discharge: 2022-07-14 | Disposition: A | Discharge status: Home / Self Care | Payer: Medicare Other | Source: Ambulatory Visit | Attending: Radiation Oncology | Admitting: Radiation Oncology

## 2022-07-14 ENCOUNTER — Other Ambulatory Visit: Payer: Self-pay

## 2022-07-14 DIAGNOSIS — C44222 Squamous cell carcinoma of skin of right ear and external auricular canal: Secondary | ICD-10-CM | POA: Diagnosis not present

## 2022-07-14 DIAGNOSIS — Z510 Encounter for antineoplastic radiation therapy: Secondary | ICD-10-CM | POA: Diagnosis not present

## 2022-07-14 LAB — RAD ONC ARIA SESSION SUMMARY
Course Elapsed Days: 1
Plan Fractions Treated to Date: 1
Plan Prescribed Dose Per Fraction: 2.5 Gy
Plan Total Fractions Prescribed: 19
Plan Total Prescribed Dose: 47.5 Gy
Reference Point Dosage Given to Date: 5 Gy
Reference Point Session Dosage Given: 2.5 Gy
Session Number: 2

## 2022-07-15 ENCOUNTER — Other Ambulatory Visit: Payer: Self-pay

## 2022-07-15 ENCOUNTER — Ambulatory Visit
Admission: RE | Admit: 2022-07-15 | Discharge: 2022-07-15 | Disposition: A | Discharge status: Home / Self Care | Payer: Medicare Other | Source: Ambulatory Visit | Attending: Radiation Oncology | Admitting: Radiation Oncology

## 2022-07-15 DIAGNOSIS — Z510 Encounter for antineoplastic radiation therapy: Secondary | ICD-10-CM | POA: Diagnosis not present

## 2022-07-15 DIAGNOSIS — C44222 Squamous cell carcinoma of skin of right ear and external auricular canal: Secondary | ICD-10-CM | POA: Diagnosis not present

## 2022-07-15 LAB — RAD ONC ARIA SESSION SUMMARY
Course Elapsed Days: 2
Plan Fractions Treated to Date: 2
Plan Prescribed Dose Per Fraction: 2.5 Gy
Plan Total Fractions Prescribed: 19
Plan Total Prescribed Dose: 47.5 Gy
Reference Point Dosage Given to Date: 7.5 Gy
Reference Point Session Dosage Given: 2.5 Gy
Session Number: 3

## 2022-07-18 ENCOUNTER — Other Ambulatory Visit: Payer: Self-pay

## 2022-07-18 ENCOUNTER — Ambulatory Visit
Admission: RE | Admit: 2022-07-18 | Discharge: 2022-07-18 | Disposition: A | Discharge status: Home / Self Care | Payer: Medicare Other | Source: Ambulatory Visit | Attending: Radiation Oncology | Admitting: Radiation Oncology

## 2022-07-18 ENCOUNTER — Ambulatory Visit: Payer: Medicare Other

## 2022-07-18 DIAGNOSIS — Z510 Encounter for antineoplastic radiation therapy: Secondary | ICD-10-CM | POA: Diagnosis not present

## 2022-07-18 DIAGNOSIS — C44222 Squamous cell carcinoma of skin of right ear and external auricular canal: Secondary | ICD-10-CM

## 2022-07-18 DIAGNOSIS — L89153 Pressure ulcer of sacral region, stage 3: Secondary | ICD-10-CM | POA: Diagnosis not present

## 2022-07-18 DIAGNOSIS — L97522 Non-pressure chronic ulcer of other part of left foot with fat layer exposed: Secondary | ICD-10-CM | POA: Diagnosis not present

## 2022-07-18 LAB — RAD ONC ARIA SESSION SUMMARY
Course Elapsed Days: 5
Plan Fractions Treated to Date: 3
Plan Prescribed Dose Per Fraction: 2.5 Gy
Plan Total Fractions Prescribed: 19
Plan Total Prescribed Dose: 47.5 Gy
Reference Point Dosage Given to Date: 10 Gy
Reference Point Session Dosage Given: 2.5 Gy
Session Number: 4

## 2022-07-18 MED ORDER — SONAFINE EX EMUL
1.0000 | Freq: Once | CUTANEOUS | Status: AC
  Administered 2022-07-18: 1 via TOPICAL

## 2022-07-18 NOTE — Progress Notes (Signed)
Pt here for patient teaching.  Pt given Radiation and You booklet, skin care instructions, and Sonafine.  Reviewed areas of pertinence such as fatigue, hair loss, mouth changes, skin changes, throat changes, earaches, and taste changes . Pt able to give teach back of to pat skin and use unscented/gentle soap,apply Sonafine bid and avoid applying anything to skin within 4 hours of treatment. Pt demonstrated understanding, needs reinforcement, and verbalizes understanding of information given and will contact nursing with any questions or concerns.     [email or web address]

## 2022-07-19 ENCOUNTER — Other Ambulatory Visit: Payer: Self-pay

## 2022-07-19 ENCOUNTER — Ambulatory Visit
Admission: RE | Admit: 2022-07-19 | Discharge: 2022-07-19 | Disposition: A | Discharge status: Home / Self Care | Payer: Medicare Other | Source: Ambulatory Visit | Attending: Radiation Oncology | Admitting: Radiation Oncology

## 2022-07-19 DIAGNOSIS — C44222 Squamous cell carcinoma of skin of right ear and external auricular canal: Secondary | ICD-10-CM | POA: Diagnosis not present

## 2022-07-19 DIAGNOSIS — Z510 Encounter for antineoplastic radiation therapy: Secondary | ICD-10-CM | POA: Diagnosis not present

## 2022-07-19 LAB — RAD ONC ARIA SESSION SUMMARY
Course Elapsed Days: 6
Plan Fractions Treated to Date: 4
Plan Prescribed Dose Per Fraction: 2.5 Gy
Plan Total Fractions Prescribed: 19
Plan Total Prescribed Dose: 47.5 Gy
Reference Point Dosage Given to Date: 12.5 Gy
Reference Point Session Dosage Given: 2.5 Gy
Session Number: 5

## 2022-07-20 ENCOUNTER — Other Ambulatory Visit: Payer: Self-pay

## 2022-07-20 ENCOUNTER — Ambulatory Visit
Admission: RE | Admit: 2022-07-20 | Discharge: 2022-07-20 | Disposition: A | Discharge status: Home / Self Care | Payer: Medicare Other | Source: Ambulatory Visit | Attending: Radiation Oncology | Admitting: Radiation Oncology

## 2022-07-20 DIAGNOSIS — M869 Osteomyelitis, unspecified: Secondary | ICD-10-CM | POA: Diagnosis not present

## 2022-07-20 DIAGNOSIS — C44222 Squamous cell carcinoma of skin of right ear and external auricular canal: Secondary | ICD-10-CM | POA: Diagnosis not present

## 2022-07-20 DIAGNOSIS — E1169 Type 2 diabetes mellitus with other specified complication: Secondary | ICD-10-CM | POA: Diagnosis not present

## 2022-07-20 DIAGNOSIS — Z510 Encounter for antineoplastic radiation therapy: Secondary | ICD-10-CM | POA: Diagnosis not present

## 2022-07-20 DIAGNOSIS — I480 Paroxysmal atrial fibrillation: Secondary | ICD-10-CM | POA: Diagnosis not present

## 2022-07-20 DIAGNOSIS — E785 Hyperlipidemia, unspecified: Secondary | ICD-10-CM | POA: Diagnosis not present

## 2022-07-20 DIAGNOSIS — M4646 Discitis, unspecified, lumbar region: Secondary | ICD-10-CM | POA: Diagnosis not present

## 2022-07-20 DIAGNOSIS — M6281 Muscle weakness (generalized): Secondary | ICD-10-CM | POA: Diagnosis not present

## 2022-07-20 DIAGNOSIS — G9341 Metabolic encephalopathy: Secondary | ICD-10-CM | POA: Diagnosis not present

## 2022-07-20 DIAGNOSIS — I5032 Chronic diastolic (congestive) heart failure: Secondary | ICD-10-CM | POA: Diagnosis not present

## 2022-07-20 LAB — RAD ONC ARIA SESSION SUMMARY
Course Elapsed Days: 7
Plan Fractions Treated to Date: 5
Plan Prescribed Dose Per Fraction: 2.5 Gy
Plan Total Fractions Prescribed: 19
Plan Total Prescribed Dose: 47.5 Gy
Reference Point Dosage Given to Date: 15 Gy
Reference Point Session Dosage Given: 2.5 Gy
Session Number: 6

## 2022-07-21 ENCOUNTER — Other Ambulatory Visit: Payer: Self-pay | Admitting: *Deleted

## 2022-07-21 ENCOUNTER — Ambulatory Visit
Admission: RE | Admit: 2022-07-21 | Discharge: 2022-07-21 | Disposition: A | Discharge status: Home / Self Care | Payer: Medicare Other | Source: Ambulatory Visit | Attending: Radiation Oncology | Admitting: Radiation Oncology

## 2022-07-21 ENCOUNTER — Other Ambulatory Visit: Payer: Self-pay

## 2022-07-21 DIAGNOSIS — C44222 Squamous cell carcinoma of skin of right ear and external auricular canal: Secondary | ICD-10-CM | POA: Diagnosis not present

## 2022-07-21 DIAGNOSIS — Z510 Encounter for antineoplastic radiation therapy: Secondary | ICD-10-CM | POA: Diagnosis not present

## 2022-07-21 DIAGNOSIS — D649 Anemia, unspecified: Secondary | ICD-10-CM | POA: Diagnosis not present

## 2022-07-21 DIAGNOSIS — E1169 Type 2 diabetes mellitus with other specified complication: Secondary | ICD-10-CM | POA: Diagnosis not present

## 2022-07-21 DIAGNOSIS — Z89511 Acquired absence of right leg below knee: Secondary | ICD-10-CM | POA: Diagnosis not present

## 2022-07-21 DIAGNOSIS — M720 Palmar fascial fibromatosis [Dupuytren]: Secondary | ICD-10-CM | POA: Diagnosis not present

## 2022-07-21 LAB — RAD ONC ARIA SESSION SUMMARY
Course Elapsed Days: 8
Plan Fractions Treated to Date: 6
Plan Prescribed Dose Per Fraction: 2.5 Gy
Plan Total Fractions Prescribed: 19
Plan Total Prescribed Dose: 47.5 Gy
Reference Point Dosage Given to Date: 17.5 Gy
Reference Point Session Dosage Given: 2.5 Gy
Session Number: 7

## 2022-07-21 NOTE — Patient Outreach (Signed)
[place] Coordinator follow up. [name] resides in [facility].   Met with therapy manager and SNF social workers at [facility]. [name] won appeal to discharge. Continues with therapy. Anticipated transition plan is to stay in [place] pay until after daily radiation treatments have concluded. SNF social worker reports radiation treatments to last one month.  Will continue to follow.  PCP office [facility] at [facility] has [facility] care management services.   Marthenia Rolling, MSN, RN,BSN [place] Acute Care Coordinator [phone] [facility]) [phone]  (Toll free office)

## 2022-07-22 ENCOUNTER — Other Ambulatory Visit: Payer: Self-pay

## 2022-07-22 ENCOUNTER — Ambulatory Visit
Admission: RE | Admit: 2022-07-22 | Discharge: 2022-07-22 | Disposition: A | Discharge status: Home / Self Care | Payer: Medicare Other | Source: Ambulatory Visit | Attending: Radiation Oncology | Admitting: Radiation Oncology

## 2022-07-22 DIAGNOSIS — I5032 Chronic diastolic (congestive) heart failure: Secondary | ICD-10-CM | POA: Diagnosis not present

## 2022-07-22 DIAGNOSIS — M869 Osteomyelitis, unspecified: Secondary | ICD-10-CM | POA: Diagnosis not present

## 2022-07-22 DIAGNOSIS — C44222 Squamous cell carcinoma of skin of right ear and external auricular canal: Secondary | ICD-10-CM | POA: Diagnosis not present

## 2022-07-22 DIAGNOSIS — E785 Hyperlipidemia, unspecified: Secondary | ICD-10-CM | POA: Diagnosis not present

## 2022-07-22 DIAGNOSIS — G9341 Metabolic encephalopathy: Secondary | ICD-10-CM | POA: Diagnosis not present

## 2022-07-22 DIAGNOSIS — Z510 Encounter for antineoplastic radiation therapy: Secondary | ICD-10-CM | POA: Diagnosis not present

## 2022-07-22 DIAGNOSIS — M6281 Muscle weakness (generalized): Secondary | ICD-10-CM | POA: Diagnosis not present

## 2022-07-22 DIAGNOSIS — I480 Paroxysmal atrial fibrillation: Secondary | ICD-10-CM | POA: Diagnosis not present

## 2022-07-22 DIAGNOSIS — M4646 Discitis, unspecified, lumbar region: Secondary | ICD-10-CM | POA: Diagnosis not present

## 2022-07-22 DIAGNOSIS — E1169 Type 2 diabetes mellitus with other specified complication: Secondary | ICD-10-CM | POA: Diagnosis not present

## 2022-07-22 LAB — RAD ONC ARIA SESSION SUMMARY
Course Elapsed Days: 9
Plan Fractions Treated to Date: 7
Plan Prescribed Dose Per Fraction: 2.5 Gy
Plan Total Fractions Prescribed: 19
Plan Total Prescribed Dose: 47.5 Gy
Reference Point Dosage Given to Date: 20 Gy
Reference Point Session Dosage Given: 2.5 Gy
Session Number: 8

## 2022-07-25 DIAGNOSIS — L89153 Pressure ulcer of sacral region, stage 3: Secondary | ICD-10-CM | POA: Diagnosis not present

## 2022-07-26 ENCOUNTER — Ambulatory Visit: Payer: Medicare Other

## 2022-07-27 ENCOUNTER — Other Ambulatory Visit: Payer: Self-pay

## 2022-07-27 ENCOUNTER — Ambulatory Visit
Admission: RE | Admit: 2022-07-27 | Discharge: 2022-07-27 | Disposition: A | Discharge status: Home / Self Care | Payer: Medicare Other | Source: Ambulatory Visit | Attending: Radiation Oncology | Admitting: Radiation Oncology

## 2022-07-27 ENCOUNTER — Ambulatory Visit: Payer: Medicare Other

## 2022-07-27 DIAGNOSIS — G9341 Metabolic encephalopathy: Secondary | ICD-10-CM | POA: Diagnosis not present

## 2022-07-27 DIAGNOSIS — Z510 Encounter for antineoplastic radiation therapy: Secondary | ICD-10-CM | POA: Diagnosis not present

## 2022-07-27 DIAGNOSIS — M869 Osteomyelitis, unspecified: Secondary | ICD-10-CM | POA: Diagnosis not present

## 2022-07-27 DIAGNOSIS — E1169 Type 2 diabetes mellitus with other specified complication: Secondary | ICD-10-CM | POA: Diagnosis not present

## 2022-07-27 DIAGNOSIS — M6281 Muscle weakness (generalized): Secondary | ICD-10-CM | POA: Diagnosis not present

## 2022-07-27 DIAGNOSIS — C44222 Squamous cell carcinoma of skin of right ear and external auricular canal: Secondary | ICD-10-CM | POA: Diagnosis not present

## 2022-07-27 DIAGNOSIS — I5032 Chronic diastolic (congestive) heart failure: Secondary | ICD-10-CM | POA: Diagnosis not present

## 2022-07-27 DIAGNOSIS — M4646 Discitis, unspecified, lumbar region: Secondary | ICD-10-CM | POA: Diagnosis not present

## 2022-07-27 DIAGNOSIS — E785 Hyperlipidemia, unspecified: Secondary | ICD-10-CM | POA: Diagnosis not present

## 2022-07-27 DIAGNOSIS — I480 Paroxysmal atrial fibrillation: Secondary | ICD-10-CM | POA: Diagnosis not present

## 2022-07-27 LAB — RAD ONC ARIA SESSION SUMMARY
Course Elapsed Days: 14
Plan Fractions Treated to Date: 8
Plan Prescribed Dose Per Fraction: 2.5 Gy
Plan Total Fractions Prescribed: 19
Plan Total Prescribed Dose: 47.5 Gy
Reference Point Dosage Given to Date: 22.5 Gy
Reference Point Session Dosage Given: 2.5 Gy
Session Number: 9

## 2022-07-28 ENCOUNTER — Other Ambulatory Visit: Payer: Self-pay

## 2022-07-28 ENCOUNTER — Other Ambulatory Visit: Payer: Self-pay | Admitting: *Deleted

## 2022-07-28 ENCOUNTER — Ambulatory Visit
Admission: RE | Admit: 2022-07-28 | Discharge: 2022-07-28 | Disposition: A | Discharge status: Home / Self Care | Payer: Medicare Other | Source: Ambulatory Visit | Attending: Radiation Oncology | Admitting: Radiation Oncology

## 2022-07-28 DIAGNOSIS — Z510 Encounter for antineoplastic radiation therapy: Secondary | ICD-10-CM | POA: Diagnosis not present

## 2022-07-28 DIAGNOSIS — C44222 Squamous cell carcinoma of skin of right ear and external auricular canal: Secondary | ICD-10-CM | POA: Diagnosis not present

## 2022-07-28 LAB — RAD ONC ARIA SESSION SUMMARY
Course Elapsed Days: 15
Plan Fractions Treated to Date: 9
Plan Prescribed Dose Per Fraction: 2.5 Gy
Plan Total Fractions Prescribed: 19
Plan Total Prescribed Dose: 47.5 Gy
Reference Point Dosage Given to Date: 25 Gy
Reference Point Session Dosage Given: 2.5 Gy
Session Number: 10

## 2022-07-28 NOTE — Patient Outreach (Signed)
[facility] Post- Acute Care Coordinator follow up. [name] remains in [facility].   Met with SNF social worker and Civil engineer, contracting at [employer]. [name] last covered day with insurance is 9/8. Will transition to private pay 07/30/22 at [facility]. Will continue to have restorative therapy to increase level of function with toileting. Plan is to remain until after cancer treatments.   Will follow for dc date. Will send notification to [facility] care management if/when [name] returns home.    PCP office [facility] at [facility] has [facility] care management services.    [name] Rolling, MSN, RN,BSN [place] Acute Care Coordinator [phone] [facility]) [phone]  (Toll free office)

## 2022-07-29 ENCOUNTER — Ambulatory Visit
Admission: RE | Admit: 2022-07-29 | Discharge: 2022-07-29 | Disposition: A | Discharge status: Home / Self Care | Payer: Medicare Other | Source: Ambulatory Visit | Attending: Radiation Oncology | Admitting: Radiation Oncology

## 2022-07-29 ENCOUNTER — Other Ambulatory Visit: Payer: Self-pay

## 2022-07-29 DIAGNOSIS — C44222 Squamous cell carcinoma of skin of right ear and external auricular canal: Secondary | ICD-10-CM | POA: Diagnosis not present

## 2022-07-29 DIAGNOSIS — E785 Hyperlipidemia, unspecified: Secondary | ICD-10-CM | POA: Diagnosis not present

## 2022-07-29 DIAGNOSIS — G9341 Metabolic encephalopathy: Secondary | ICD-10-CM | POA: Diagnosis not present

## 2022-07-29 DIAGNOSIS — Z510 Encounter for antineoplastic radiation therapy: Secondary | ICD-10-CM | POA: Diagnosis not present

## 2022-07-29 DIAGNOSIS — E1169 Type 2 diabetes mellitus with other specified complication: Secondary | ICD-10-CM | POA: Diagnosis not present

## 2022-07-29 DIAGNOSIS — M6281 Muscle weakness (generalized): Secondary | ICD-10-CM | POA: Diagnosis not present

## 2022-07-29 DIAGNOSIS — I480 Paroxysmal atrial fibrillation: Secondary | ICD-10-CM | POA: Diagnosis not present

## 2022-07-29 DIAGNOSIS — I5032 Chronic diastolic (congestive) heart failure: Secondary | ICD-10-CM | POA: Diagnosis not present

## 2022-07-29 DIAGNOSIS — M869 Osteomyelitis, unspecified: Secondary | ICD-10-CM | POA: Diagnosis not present

## 2022-07-29 DIAGNOSIS — M4646 Discitis, unspecified, lumbar region: Secondary | ICD-10-CM | POA: Diagnosis not present

## 2022-07-29 LAB — RAD ONC ARIA SESSION SUMMARY
Course Elapsed Days: 16
Plan Fractions Treated to Date: 10
Plan Prescribed Dose Per Fraction: 2.5 Gy
Plan Total Fractions Prescribed: 19
Plan Total Prescribed Dose: 47.5 Gy
Reference Point Dosage Given to Date: 27.5 Gy
Reference Point Session Dosage Given: 2.5 Gy
Session Number: 11

## 2022-08-01 ENCOUNTER — Ambulatory Visit
Admission: RE | Admit: 2022-08-01 | Discharge: 2022-08-01 | Disposition: A | Discharge status: Home / Self Care | Payer: Medicare Other | Source: Ambulatory Visit | Attending: Radiation Oncology | Admitting: Radiation Oncology

## 2022-08-01 ENCOUNTER — Ambulatory Visit: Payer: Medicare Other

## 2022-08-01 ENCOUNTER — Other Ambulatory Visit: Payer: Self-pay

## 2022-08-01 DIAGNOSIS — Z510 Encounter for antineoplastic radiation therapy: Secondary | ICD-10-CM | POA: Diagnosis not present

## 2022-08-01 DIAGNOSIS — C44222 Squamous cell carcinoma of skin of right ear and external auricular canal: Secondary | ICD-10-CM | POA: Diagnosis not present

## 2022-08-01 DIAGNOSIS — L89153 Pressure ulcer of sacral region, stage 3: Secondary | ICD-10-CM | POA: Diagnosis not present

## 2022-08-01 LAB — RAD ONC ARIA SESSION SUMMARY
Course Elapsed Days: 19
Plan Fractions Treated to Date: 11
Plan Prescribed Dose Per Fraction: 2.5 Gy
Plan Total Fractions Prescribed: 19
Plan Total Prescribed Dose: 47.5 Gy
Reference Point Dosage Given to Date: 30 Gy
Reference Point Session Dosage Given: 2.5 Gy
Session Number: 12

## 2022-08-02 ENCOUNTER — Ambulatory Visit
Admission: RE | Admit: 2022-08-02 | Discharge: 2022-08-02 | Disposition: A | Discharge status: Home / Self Care | Payer: Medicare Other | Source: Ambulatory Visit | Attending: Radiation Oncology | Admitting: Radiation Oncology

## 2022-08-02 ENCOUNTER — Other Ambulatory Visit: Payer: Self-pay

## 2022-08-02 DIAGNOSIS — D649 Anemia, unspecified: Secondary | ICD-10-CM | POA: Diagnosis not present

## 2022-08-02 DIAGNOSIS — Z89511 Acquired absence of right leg below knee: Secondary | ICD-10-CM | POA: Diagnosis not present

## 2022-08-02 DIAGNOSIS — C44222 Squamous cell carcinoma of skin of right ear and external auricular canal: Secondary | ICD-10-CM | POA: Diagnosis not present

## 2022-08-02 DIAGNOSIS — Z510 Encounter for antineoplastic radiation therapy: Secondary | ICD-10-CM | POA: Diagnosis not present

## 2022-08-02 DIAGNOSIS — E1169 Type 2 diabetes mellitus with other specified complication: Secondary | ICD-10-CM | POA: Diagnosis not present

## 2022-08-02 DIAGNOSIS — E785 Hyperlipidemia, unspecified: Secondary | ICD-10-CM | POA: Diagnosis not present

## 2022-08-02 LAB — RAD ONC ARIA SESSION SUMMARY
Course Elapsed Days: 20
Plan Fractions Treated to Date: 12
Plan Prescribed Dose Per Fraction: 2.5 Gy
Plan Total Fractions Prescribed: 19
Plan Total Prescribed Dose: 47.5 Gy
Reference Point Dosage Given to Date: 32.5 Gy
Reference Point Session Dosage Given: 2.5 Gy
Session Number: 13

## 2022-08-03 ENCOUNTER — Ambulatory Visit
Admission: RE | Admit: 2022-08-03 | Discharge: 2022-08-03 | Disposition: A | Discharge status: Home / Self Care | Payer: Medicare Other | Source: Ambulatory Visit | Attending: Radiation Oncology | Admitting: Radiation Oncology

## 2022-08-03 ENCOUNTER — Other Ambulatory Visit: Payer: Self-pay

## 2022-08-03 DIAGNOSIS — C44222 Squamous cell carcinoma of skin of right ear and external auricular canal: Secondary | ICD-10-CM | POA: Diagnosis not present

## 2022-08-03 DIAGNOSIS — Z510 Encounter for antineoplastic radiation therapy: Secondary | ICD-10-CM | POA: Diagnosis not present

## 2022-08-03 LAB — RAD ONC ARIA SESSION SUMMARY
Course Elapsed Days: 21
Plan Fractions Treated to Date: 13
Plan Prescribed Dose Per Fraction: 2.5 Gy
Plan Total Fractions Prescribed: 19
Plan Total Prescribed Dose: 47.5 Gy
Reference Point Dosage Given to Date: 35 Gy
Reference Point Session Dosage Given: 2.5 Gy
Session Number: 14

## 2022-08-04 ENCOUNTER — Other Ambulatory Visit: Payer: Self-pay | Admitting: *Deleted

## 2022-08-04 ENCOUNTER — Other Ambulatory Visit: Payer: Self-pay

## 2022-08-04 ENCOUNTER — Ambulatory Visit
Admission: RE | Admit: 2022-08-04 | Discharge: 2022-08-04 | Disposition: A | Discharge status: Home / Self Care | Payer: Medicare Other | Source: Ambulatory Visit | Attending: Radiation Oncology | Admitting: Radiation Oncology

## 2022-08-04 DIAGNOSIS — C44222 Squamous cell carcinoma of skin of right ear and external auricular canal: Secondary | ICD-10-CM | POA: Diagnosis not present

## 2022-08-04 DIAGNOSIS — Z510 Encounter for antineoplastic radiation therapy: Secondary | ICD-10-CM | POA: Diagnosis not present

## 2022-08-04 LAB — RAD ONC ARIA SESSION SUMMARY
Course Elapsed Days: 22
Plan Fractions Treated to Date: 14
Plan Prescribed Dose Per Fraction: 2.5 Gy
Plan Total Fractions Prescribed: 19
Plan Total Prescribed Dose: 47.5 Gy
Reference Point Dosage Given to Date: 37.5 Gy
Reference Point Session Dosage Given: 2.5 Gy
Session Number: 15

## 2022-08-04 NOTE — Patient Outreach (Signed)
[place] Coordinator follow up. Mr. Yeatts resides in [facility]. Following for potential care coordination/care management services.   Update received from [facility] therapy manager indicating Mr. Weed has transitioned to long term care.   Marthenia Rolling, MSN, RN,BSN [place] Acute Care Coordinator [phone] (Direct dial)

## 2022-08-05 ENCOUNTER — Other Ambulatory Visit: Payer: Self-pay

## 2022-08-05 ENCOUNTER — Ambulatory Visit
Admission: RE | Admit: 2022-08-05 | Discharge: 2022-08-05 | Disposition: A | Discharge status: Home / Self Care | Payer: Medicare Other | Source: Ambulatory Visit | Attending: Radiation Oncology | Admitting: Radiation Oncology

## 2022-08-05 DIAGNOSIS — C44222 Squamous cell carcinoma of skin of right ear and external auricular canal: Secondary | ICD-10-CM | POA: Diagnosis not present

## 2022-08-05 DIAGNOSIS — Z510 Encounter for antineoplastic radiation therapy: Secondary | ICD-10-CM | POA: Diagnosis not present

## 2022-08-05 LAB — RAD ONC ARIA SESSION SUMMARY
Course Elapsed Days: 23
Plan Fractions Treated to Date: 15
Plan Prescribed Dose Per Fraction: 2.5 Gy
Plan Total Fractions Prescribed: 19
Plan Total Prescribed Dose: 47.5 Gy
Reference Point Dosage Given to Date: 40 Gy
Reference Point Session Dosage Given: 2.5 Gy
Session Number: 16

## 2022-08-08 ENCOUNTER — Ambulatory Visit: Admission: RE | Admit: 2022-08-08 | Payer: Medicare Other | Source: Ambulatory Visit

## 2022-08-08 ENCOUNTER — Other Ambulatory Visit: Payer: Self-pay

## 2022-08-08 ENCOUNTER — Ambulatory Visit
Admission: RE | Admit: 2022-08-08 | Discharge: 2022-08-08 | Disposition: A | Discharge status: Home / Self Care | Payer: Medicare Other | Source: Ambulatory Visit | Attending: Radiation Oncology | Admitting: Radiation Oncology

## 2022-08-08 DIAGNOSIS — Z510 Encounter for antineoplastic radiation therapy: Secondary | ICD-10-CM | POA: Diagnosis not present

## 2022-08-08 DIAGNOSIS — C44222 Squamous cell carcinoma of skin of right ear and external auricular canal: Secondary | ICD-10-CM | POA: Diagnosis not present

## 2022-08-08 LAB — RAD ONC ARIA SESSION SUMMARY
Course Elapsed Days: 26
Plan Fractions Treated to Date: 16
Plan Prescribed Dose Per Fraction: 2.5 Gy
Plan Total Fractions Prescribed: 19
Plan Total Prescribed Dose: 47.5 Gy
Reference Point Dosage Given to Date: 42.5 Gy
Reference Point Session Dosage Given: 2.5 Gy
Session Number: 17

## 2022-08-09 ENCOUNTER — Other Ambulatory Visit: Payer: Self-pay

## 2022-08-09 ENCOUNTER — Ambulatory Visit
Admission: RE | Admit: 2022-08-09 | Discharge: 2022-08-09 | Disposition: A | Discharge status: Home / Self Care | Payer: Medicare Other | Source: Ambulatory Visit | Attending: Radiation Oncology | Admitting: Radiation Oncology

## 2022-08-09 DIAGNOSIS — C44222 Squamous cell carcinoma of skin of right ear and external auricular canal: Secondary | ICD-10-CM | POA: Diagnosis not present

## 2022-08-09 DIAGNOSIS — Z510 Encounter for antineoplastic radiation therapy: Secondary | ICD-10-CM | POA: Diagnosis not present

## 2022-08-09 LAB — RAD ONC ARIA SESSION SUMMARY
Course Elapsed Days: 27
Plan Fractions Treated to Date: 17
Plan Prescribed Dose Per Fraction: 2.5 Gy
Plan Total Fractions Prescribed: 19
Plan Total Prescribed Dose: 47.5 Gy
Reference Point Dosage Given to Date: 45 Gy
Reference Point Session Dosage Given: 2.5 Gy
Session Number: 18

## 2022-08-10 ENCOUNTER — Other Ambulatory Visit: Payer: Self-pay

## 2022-08-10 ENCOUNTER — Ambulatory Visit
Admission: RE | Admit: 2022-08-10 | Discharge: 2022-08-10 | Disposition: A | Discharge status: Home / Self Care | Payer: Medicare Other | Source: Ambulatory Visit | Attending: Radiation Oncology | Admitting: Radiation Oncology

## 2022-08-10 ENCOUNTER — Ambulatory Visit: Payer: Medicare Other

## 2022-08-10 DIAGNOSIS — Z510 Encounter for antineoplastic radiation therapy: Secondary | ICD-10-CM | POA: Diagnosis not present

## 2022-08-10 DIAGNOSIS — C44222 Squamous cell carcinoma of skin of right ear and external auricular canal: Secondary | ICD-10-CM | POA: Diagnosis not present

## 2022-08-10 LAB — RAD ONC ARIA SESSION SUMMARY
Course Elapsed Days: 28
Plan Fractions Treated to Date: 18
Plan Prescribed Dose Per Fraction: 2.5 Gy
Plan Total Fractions Prescribed: 19
Plan Total Prescribed Dose: 47.5 Gy
Reference Point Dosage Given to Date: 47.5 Gy
Reference Point Session Dosage Given: 2.5 Gy
Session Number: 19

## 2022-08-11 ENCOUNTER — Ambulatory Visit
Admission: RE | Admit: 2022-08-11 | Discharge: 2022-08-11 | Disposition: A | Discharge status: Home / Self Care | Payer: Medicare Other | Source: Ambulatory Visit | Attending: Radiation Oncology | Admitting: Radiation Oncology

## 2022-08-11 ENCOUNTER — Encounter: Payer: Self-pay | Admitting: Radiation Oncology

## 2022-08-11 ENCOUNTER — Other Ambulatory Visit: Payer: Self-pay

## 2022-08-11 DIAGNOSIS — Z510 Encounter for antineoplastic radiation therapy: Secondary | ICD-10-CM | POA: Diagnosis not present

## 2022-08-11 DIAGNOSIS — C44222 Squamous cell carcinoma of skin of right ear and external auricular canal: Secondary | ICD-10-CM | POA: Diagnosis not present

## 2022-08-11 LAB — RAD ONC ARIA SESSION SUMMARY
Course Elapsed Days: 29
Plan Fractions Treated to Date: 19
Plan Prescribed Dose Per Fraction: 2.5 Gy
Plan Total Fractions Prescribed: 19
Plan Total Prescribed Dose: 47.5 Gy
Reference Point Dosage Given to Date: 50 Gy
Reference Point Session Dosage Given: 2.5 Gy
Session Number: 20

## 2022-08-11 NOTE — Progress Notes (Signed)
Oncology Nurse Navigator Documentation   [name] completed his radiation treatment today. He is scheduled to see Dr. Isidore Moos on 10/27 for post-treatment follow up.   Harlow Asa RN, BSN, OCN Head & Neck Oncology Nurse [place] at [facility] Phone # [phone]  Fax # [phone]

## 2022-08-12 DIAGNOSIS — L89153 Pressure ulcer of sacral region, stage 3: Secondary | ICD-10-CM | POA: Diagnosis not present

## 2022-08-13 DIAGNOSIS — M6281 Muscle weakness (generalized): Secondary | ICD-10-CM | POA: Diagnosis not present

## 2022-08-13 DIAGNOSIS — M25521 Pain in right elbow: Secondary | ICD-10-CM | POA: Diagnosis not present

## 2022-08-13 DIAGNOSIS — M869 Osteomyelitis, unspecified: Secondary | ICD-10-CM | POA: Diagnosis not present

## 2022-08-15 DIAGNOSIS — E785 Hyperlipidemia, unspecified: Secondary | ICD-10-CM | POA: Diagnosis not present

## 2022-08-15 DIAGNOSIS — M79601 Pain in right arm: Secondary | ICD-10-CM | POA: Diagnosis not present

## 2022-08-15 DIAGNOSIS — D649 Anemia, unspecified: Secondary | ICD-10-CM | POA: Diagnosis not present

## 2022-08-15 DIAGNOSIS — M79603 Pain in arm, unspecified: Secondary | ICD-10-CM | POA: Diagnosis not present

## 2022-08-15 DIAGNOSIS — E1169 Type 2 diabetes mellitus with other specified complication: Secondary | ICD-10-CM | POA: Diagnosis not present

## 2022-08-15 DIAGNOSIS — M869 Osteomyelitis, unspecified: Secondary | ICD-10-CM | POA: Diagnosis not present

## 2022-08-15 DIAGNOSIS — M6281 Muscle weakness (generalized): Secondary | ICD-10-CM | POA: Diagnosis not present

## 2022-08-16 DIAGNOSIS — M869 Osteomyelitis, unspecified: Secondary | ICD-10-CM | POA: Diagnosis not present

## 2022-08-16 DIAGNOSIS — M6281 Muscle weakness (generalized): Secondary | ICD-10-CM | POA: Diagnosis not present

## 2022-08-17 DIAGNOSIS — M6281 Muscle weakness (generalized): Secondary | ICD-10-CM | POA: Diagnosis not present

## 2022-08-17 DIAGNOSIS — M869 Osteomyelitis, unspecified: Secondary | ICD-10-CM | POA: Diagnosis not present

## 2022-08-18 DIAGNOSIS — L0390 Cellulitis, unspecified: Secondary | ICD-10-CM | POA: Diagnosis not present

## 2022-08-18 DIAGNOSIS — E785 Hyperlipidemia, unspecified: Secondary | ICD-10-CM | POA: Diagnosis not present

## 2022-08-18 DIAGNOSIS — E1169 Type 2 diabetes mellitus with other specified complication: Secondary | ICD-10-CM | POA: Diagnosis not present

## 2022-08-18 DIAGNOSIS — H1033 Unspecified acute conjunctivitis, bilateral: Secondary | ICD-10-CM | POA: Diagnosis not present

## 2022-08-19 DIAGNOSIS — M869 Osteomyelitis, unspecified: Secondary | ICD-10-CM | POA: Diagnosis not present

## 2022-08-19 DIAGNOSIS — M6281 Muscle weakness (generalized): Secondary | ICD-10-CM | POA: Diagnosis not present

## 2022-08-22 DIAGNOSIS — M6281 Muscle weakness (generalized): Secondary | ICD-10-CM | POA: Diagnosis not present

## 2022-08-22 DIAGNOSIS — M869 Osteomyelitis, unspecified: Secondary | ICD-10-CM | POA: Diagnosis not present

## 2022-08-24 DIAGNOSIS — E785 Hyperlipidemia, unspecified: Secondary | ICD-10-CM | POA: Diagnosis not present

## 2022-08-24 DIAGNOSIS — M869 Osteomyelitis, unspecified: Secondary | ICD-10-CM | POA: Diagnosis not present

## 2022-08-24 DIAGNOSIS — L0390 Cellulitis, unspecified: Secondary | ICD-10-CM | POA: Diagnosis not present

## 2022-08-24 DIAGNOSIS — H1033 Unspecified acute conjunctivitis, bilateral: Secondary | ICD-10-CM | POA: Diagnosis not present

## 2022-08-24 DIAGNOSIS — E1169 Type 2 diabetes mellitus with other specified complication: Secondary | ICD-10-CM | POA: Diagnosis not present

## 2022-08-24 DIAGNOSIS — M6281 Muscle weakness (generalized): Secondary | ICD-10-CM | POA: Diagnosis not present

## 2022-08-24 NOTE — Progress Notes (Signed)
                                                                                                                                                             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 We were able to continue his treatment while he had covid-19.  Plan: The patient will follow-up with radiation oncology in 37mo. -----------------------------------  SEppie Gibson MD

## 2022-08-25 DIAGNOSIS — M6281 Muscle weakness (generalized): Secondary | ICD-10-CM | POA: Diagnosis not present

## 2022-08-25 DIAGNOSIS — M869 Osteomyelitis, unspecified: Secondary | ICD-10-CM | POA: Diagnosis not present

## 2022-08-26 DIAGNOSIS — M869 Osteomyelitis, unspecified: Secondary | ICD-10-CM | POA: Diagnosis not present

## 2022-08-26 DIAGNOSIS — M6281 Muscle weakness (generalized): Secondary | ICD-10-CM | POA: Diagnosis not present

## 2022-08-29 DIAGNOSIS — M869 Osteomyelitis, unspecified: Secondary | ICD-10-CM | POA: Diagnosis not present

## 2022-08-29 DIAGNOSIS — M6281 Muscle weakness (generalized): Secondary | ICD-10-CM | POA: Diagnosis not present

## 2022-08-29 DIAGNOSIS — L89153 Pressure ulcer of sacral region, stage 3: Secondary | ICD-10-CM | POA: Diagnosis not present

## 2022-08-30 DIAGNOSIS — H1820 Unspecified corneal edema: Secondary | ICD-10-CM | POA: Diagnosis not present

## 2022-08-30 DIAGNOSIS — H35371 Puckering of macula, right eye: Secondary | ICD-10-CM | POA: Diagnosis not present

## 2022-08-30 DIAGNOSIS — E119 Type 2 diabetes mellitus without complications: Secondary | ICD-10-CM | POA: Diagnosis not present

## 2022-08-30 DIAGNOSIS — M869 Osteomyelitis, unspecified: Secondary | ICD-10-CM | POA: Diagnosis not present

## 2022-08-30 DIAGNOSIS — M6281 Muscle weakness (generalized): Secondary | ICD-10-CM | POA: Diagnosis not present

## 2022-08-30 DIAGNOSIS — H353131 Nonexudative age-related macular degeneration, bilateral, early dry stage: Secondary | ICD-10-CM | POA: Diagnosis not present

## 2022-08-30 DIAGNOSIS — H5203 Hypermetropia, bilateral: Secondary | ICD-10-CM | POA: Diagnosis not present

## 2022-08-30 DIAGNOSIS — H532 Diplopia: Secondary | ICD-10-CM | POA: Diagnosis not present

## 2022-08-31 DIAGNOSIS — H1033 Unspecified acute conjunctivitis, bilateral: Secondary | ICD-10-CM | POA: Diagnosis not present

## 2022-08-31 DIAGNOSIS — M869 Osteomyelitis, unspecified: Secondary | ICD-10-CM | POA: Diagnosis not present

## 2022-08-31 DIAGNOSIS — E785 Hyperlipidemia, unspecified: Secondary | ICD-10-CM | POA: Diagnosis not present

## 2022-08-31 DIAGNOSIS — M6281 Muscle weakness (generalized): Secondary | ICD-10-CM | POA: Diagnosis not present

## 2022-08-31 DIAGNOSIS — L0390 Cellulitis, unspecified: Secondary | ICD-10-CM | POA: Diagnosis not present

## 2022-09-01 DIAGNOSIS — M869 Osteomyelitis, unspecified: Secondary | ICD-10-CM | POA: Diagnosis not present

## 2022-09-01 DIAGNOSIS — M6281 Muscle weakness (generalized): Secondary | ICD-10-CM | POA: Diagnosis not present

## 2022-09-02 DIAGNOSIS — M6281 Muscle weakness (generalized): Secondary | ICD-10-CM | POA: Diagnosis not present

## 2022-09-02 DIAGNOSIS — M869 Osteomyelitis, unspecified: Secondary | ICD-10-CM | POA: Diagnosis not present

## 2022-09-03 DIAGNOSIS — M869 Osteomyelitis, unspecified: Secondary | ICD-10-CM | POA: Diagnosis not present

## 2022-09-03 DIAGNOSIS — M6281 Muscle weakness (generalized): Secondary | ICD-10-CM | POA: Diagnosis not present

## 2022-09-04 DIAGNOSIS — M6281 Muscle weakness (generalized): Secondary | ICD-10-CM | POA: Diagnosis not present

## 2022-09-04 DIAGNOSIS — M869 Osteomyelitis, unspecified: Secondary | ICD-10-CM | POA: Diagnosis not present

## 2022-09-05 DIAGNOSIS — M869 Osteomyelitis, unspecified: Secondary | ICD-10-CM | POA: Diagnosis not present

## 2022-09-05 DIAGNOSIS — M6281 Muscle weakness (generalized): Secondary | ICD-10-CM | POA: Diagnosis not present

## 2022-09-07 DIAGNOSIS — M869 Osteomyelitis, unspecified: Secondary | ICD-10-CM | POA: Diagnosis not present

## 2022-09-07 DIAGNOSIS — M6281 Muscle weakness (generalized): Secondary | ICD-10-CM | POA: Diagnosis not present

## 2022-09-09 DIAGNOSIS — M869 Osteomyelitis, unspecified: Secondary | ICD-10-CM | POA: Diagnosis not present

## 2022-09-09 DIAGNOSIS — M6281 Muscle weakness (generalized): Secondary | ICD-10-CM | POA: Diagnosis not present

## 2022-09-10 DIAGNOSIS — Z23 Encounter for immunization: Secondary | ICD-10-CM | POA: Diagnosis not present

## 2022-09-12 DIAGNOSIS — M869 Osteomyelitis, unspecified: Secondary | ICD-10-CM | POA: Diagnosis not present

## 2022-09-12 DIAGNOSIS — M6281 Muscle weakness (generalized): Secondary | ICD-10-CM | POA: Diagnosis not present

## 2022-09-13 DIAGNOSIS — M6281 Muscle weakness (generalized): Secondary | ICD-10-CM | POA: Diagnosis not present

## 2022-09-13 DIAGNOSIS — M869 Osteomyelitis, unspecified: Secondary | ICD-10-CM | POA: Diagnosis not present

## 2022-09-14 ENCOUNTER — Ambulatory Visit: Payer: Medicare Other | Admitting: Podiatry

## 2022-09-14 DIAGNOSIS — E11621 Type 2 diabetes mellitus with foot ulcer: Secondary | ICD-10-CM

## 2022-09-14 DIAGNOSIS — E1169 Type 2 diabetes mellitus with other specified complication: Secondary | ICD-10-CM | POA: Diagnosis not present

## 2022-09-14 DIAGNOSIS — E785 Hyperlipidemia, unspecified: Secondary | ICD-10-CM | POA: Diagnosis not present

## 2022-09-14 DIAGNOSIS — M79672 Pain in left foot: Secondary | ICD-10-CM | POA: Diagnosis not present

## 2022-09-14 DIAGNOSIS — M869 Osteomyelitis, unspecified: Secondary | ICD-10-CM | POA: Diagnosis not present

## 2022-09-14 DIAGNOSIS — K219 Gastro-esophageal reflux disease without esophagitis: Secondary | ICD-10-CM | POA: Diagnosis not present

## 2022-09-14 DIAGNOSIS — M6281 Muscle weakness (generalized): Secondary | ICD-10-CM | POA: Diagnosis not present

## 2022-09-14 DIAGNOSIS — L97522 Non-pressure chronic ulcer of other part of left foot with fat layer exposed: Secondary | ICD-10-CM | POA: Diagnosis not present

## 2022-09-15 DIAGNOSIS — M6281 Muscle weakness (generalized): Secondary | ICD-10-CM | POA: Diagnosis not present

## 2022-09-15 DIAGNOSIS — M869 Osteomyelitis, unspecified: Secondary | ICD-10-CM | POA: Diagnosis not present

## 2022-09-15 NOTE — Progress Notes (Signed)
Subjective:   Patient ID: [name], male   DOB: 87 y.o.   MRN: [id number]   HPI Patient presents stating he was in the hospital for several months with an infection in his back and did develop some change in his left distal try to maintain his left lower leg if possible and states he developed some abrasions in his left heel left big toe with circulatory reduction   ROS      Objective:  Physical Exam  Patient has poor circulation left with long-term history of diabetes he is in a wheelchair with history of right BKA amputation.  Patient is noted to have a area of stress on the left heel but not broken down no subcutaneous tendon bone exposure and crusted tissue on the left hallux     Assessment:  Patient is a very poor health who we have been able to maintain and prevent him from losing his left leg with wound care     Plan:  H&P reviewed condition with him we discussed circulatory status we cannot do anything about it is probably not get to be improved today I did debride tissue I did not see any active drainage and I applied a fleece booties for the left to try to cushion better and I am hoping everything will heal uneventfully.  If any redness drainage or other pathology were to occur he is to reappoint immediately and he always understands he is at high risk of B-K amputation

## 2022-09-16 ENCOUNTER — Encounter: Payer: Self-pay | Admitting: Radiation Oncology

## 2022-09-16 ENCOUNTER — Ambulatory Visit
Admission: RE | Admit: 2022-09-16 | Discharge: 2022-09-16 | Disposition: A | Discharge status: Home / Self Care | Payer: Medicare Other | Source: Ambulatory Visit | Attending: Radiation Oncology | Admitting: Radiation Oncology

## 2022-09-16 VITALS — BP 112/61 | HR 83 | Temp 99.0°F | Resp 18

## 2022-09-16 DIAGNOSIS — Z7901 Long term (current) use of anticoagulants: Secondary | ICD-10-CM | POA: Insufficient documentation

## 2022-09-16 DIAGNOSIS — Z923 Personal history of irradiation: Secondary | ICD-10-CM | POA: Diagnosis not present

## 2022-09-16 DIAGNOSIS — C44222 Squamous cell carcinoma of skin of right ear and external auricular canal: Secondary | ICD-10-CM | POA: Insufficient documentation

## 2022-09-16 DIAGNOSIS — M869 Osteomyelitis, unspecified: Secondary | ICD-10-CM | POA: Diagnosis not present

## 2022-09-16 DIAGNOSIS — Z79899 Other long term (current) drug therapy: Secondary | ICD-10-CM | POA: Diagnosis not present

## 2022-09-16 DIAGNOSIS — M6281 Muscle weakness (generalized): Secondary | ICD-10-CM | POA: Diagnosis not present

## 2022-09-16 NOTE — Progress Notes (Signed)
Radiation Oncology         [phone] ________________________________  Name: [name] MRN: [id number]  Date: 09/16/2022  DOB: 1935/07/05  Follow-Up Visit Note  Outpatient  CC: Seward Carol, MD  Griselda Miner, MD  Diagnosis and Prior Radiotherapy:    ICD-10-CM   1. Squamous cell carcinoma, ear, right  C44.222       Cancer Staging:  Cancer Staging  Squamous cell carcinoma, ear, right Staging form: Cutaneous Carcinoma of the Head and Neck, AJCC 8th Edition - Clinical stage from 07/06/2022: Stage II (cT2, cN0, cM0) - Signed by Eppie Gibson, MD on 07/06/2022 Stage prefix: Initial diagnosis Extraosseous extension: Absent  Intent: Curative  Radiation Treatment Dates: 07/13/2022 through 08/11/2022 Site Technique Total Dose (Gy) Dose per Fx (Gy) Completed Fx Beam Energies  Pinna, Right: HN_R_ear specialPort 50/50 2.5 20/20 6E    CHIEF COMPLAINT: Here for follow-up and surveillance of right ear cancer  Narrative:  The patient returns today for routine follow-up.  He is doing well.  His main concern is new raised lesions on his right anterior scalp.  To his knowledge he does not have follow-up with dermatology.                              ALLERGIES:  is allergic to morphine and statins.  Meds: Current Outpatient Medications  Medication Sig Dispense Refill   ACCU-CHEK SOFTCLIX LANCETS lancets Use to check blood sugar three times daily. Dx E11.9 300 each 3   apixaban (ELIQUIS) 2.5 MG TABS tablet Take 2.5 mg by mouth 2 (two) times daily.     B-D ULTRAFINE III SHORT PEN 31G X 8 MM MISC USE AS DIRECTED 100 each 11   baclofen (LIORESAL) 10 MG tablet Take 10 mg by mouth every 8 (eight) hours.     Blood Glucose Calibration (ACCU-CHEK AVIVA) SOLN Use as Directed. Dx E11.9 3 each 3   diltiazem (CARDIZEM CD) 180 MG 24 hr capsule Take 180 mg by mouth daily.     furosemide (LASIX) 20 MG tablet Take 20 mg by mouth every other day.     gabapentin (NEURONTIN) 100 MG capsule Take  100 mg by mouth 2 (two) times daily.     glucose blood (ACCU-CHEK AVIVA PLUS) test strip DX E11.9, check blood sugar three times daily 300 each 11   insulin aspart (NOVOLOG) 100 UNIT/ML injection Inject 70-120 Units into the skin 3 (three) times daily before meals.     insulin glargine (LANTUS) 100 unit/mL SOPN Inject 10 Units into the skin at bedtime.     melatonin 5 MG TABS Take 5 mg by mouth at bedtime.     Multiple Vitamins-Minerals (MULTIVITAMIN PO) Take 1 tablet by mouth daily. Takes a Men's 50 Plus Multivitamin     omeprazole (PRILOSEC) 20 MG capsule Take 20 mg by mouth daily.     No current facility-administered medications for this encounter.    Physical Findings: The patient is in no acute distress. Patient is alert and oriented.  oral temperature is 99 F (37.2 C). His blood pressure is 112/61 and his pulse is 83. His respiration is 18 and oxygen saturation is 100%. .    Right external ear has healed beautifully from radiation therapy.  There is still some crusting and desquamation but no evidence of residual cancer  He does have some raised crusted lesions over his scalp, particularly the right anterior scalp.  These appear to  be on the border of his previous electron therapy which was given to the majority of his scalp years ago, based on my review of radiation plans  Lab Findings: Lab Results  Component Value Date   WBC 7.0 06/15/2022   HGB 10.6 (L) 06/15/2022   HCT 31.5 (L) 06/15/2022   MCV 82.7 06/15/2022   PLT 164 06/15/2022    Radiographic Findings: No results found.  Impression/Plan: He has had a complete clinical response to radiation therapy for right ear skin cancer.  Healing well from that.  He does have some raised crusted lesions over his scalp, particularly the right anterior scalp.  These appear to be on the border of his previous electron therapy which was given to the majority of his scalp years ago, based on my review of my colleague's radiation plan.   We will refer him back to Dr. Sarajane Jews for evaluation.  These would be a tricky area to address with radiation as they are potentially overlapping previous radiation fields.   I will see him back as needed I wished him the very best.  He is a wonderful gentleman and it was a pleasure to take care of him.  On date of service, in total, I spent 25 minutes on this encounter. Patient was seen in person.  _____________________________________   Eppie Gibson, MD

## 2022-09-16 NOTE — Progress Notes (Signed)
[name] is here to be seen for his one month follow up for radiation treatment for cancer of the ear. His last radiation treatment was on 08-11-22.  Pain issues, if any: pain in left leg and phantom pain in right leg Weight changes, if any:  Wt Readings from Last 3 Encounters:  07/12/22 131 lb 6.4 oz (59.6 kg)  07/11/22 131 lb 6.4 oz (59.6 kg)  06/15/22 136 lb 0.4 oz (61.7 kg)   Eating well with no swallowing issues.   Smoking or chewing tobacco? No  Last dermatology visit was on: Last saw Dr. Sarajane Jews in June of this year.   Skin: New areas of concern on scalp that he needs evaluated for best treatment plan. He is agreeable to more radiation if needed.   Other notable issues, if any: Had Covid two months ago, still has cough from that. Denies fatigues or falls. Denies shortness of breath.

## 2022-09-19 ENCOUNTER — Telehealth: Payer: Self-pay | Admitting: *Deleted

## 2022-09-19 DIAGNOSIS — M869 Osteomyelitis, unspecified: Secondary | ICD-10-CM | POA: Diagnosis not present

## 2022-09-19 DIAGNOSIS — M6281 Muscle weakness (generalized): Secondary | ICD-10-CM | POA: Diagnosis not present

## 2022-09-19 NOTE — Telephone Encounter (Signed)
CALLED PATIENT TO INFORM OF APPT. WITH DR. Sarajane Jews ON 09-21-22 @ 2 PM, LVM FOR A RETURN CALL

## 2022-09-20 DIAGNOSIS — M869 Osteomyelitis, unspecified: Secondary | ICD-10-CM | POA: Diagnosis not present

## 2022-09-20 DIAGNOSIS — M6281 Muscle weakness (generalized): Secondary | ICD-10-CM | POA: Diagnosis not present

## 2022-10-11 ENCOUNTER — Ambulatory Visit: Payer: Medicare Other | Admitting: Internal Medicine

## 2022-10-11 DIAGNOSIS — I495 Sick sinus syndrome: Secondary | ICD-10-CM

## 2022-10-11 DIAGNOSIS — I480 Paroxysmal atrial fibrillation: Secondary | ICD-10-CM

## 2022-11-15 ENCOUNTER — Encounter: Payer: Self-pay | Admitting: Internal Medicine

## 2022-11-15 ENCOUNTER — Ambulatory Visit: Payer: Medicare Other | Attending: Internal Medicine | Admitting: Internal Medicine

## 2022-11-15 VITALS — BP 138/62 | HR 73 | Ht 69.5 in | Wt 153.0 lb

## 2022-11-15 DIAGNOSIS — Z950 Presence of cardiac pacemaker: Secondary | ICD-10-CM | POA: Diagnosis not present

## 2022-11-15 DIAGNOSIS — I495 Sick sinus syndrome: Secondary | ICD-10-CM

## 2022-11-15 DIAGNOSIS — Z79899 Other long term (current) drug therapy: Secondary | ICD-10-CM | POA: Diagnosis not present

## 2022-11-15 DIAGNOSIS — I4821 Permanent atrial fibrillation: Secondary | ICD-10-CM | POA: Diagnosis not present

## 2022-11-15 NOTE — Patient Instructions (Addendum)
Medication Instructions:  The current medical regimen is effective;  continue present plan and medications.  *If you need a refill on your cardiac medications before your next appointment, please call your pharmacy*  Lab:  Please have blood work today (BMP)  Follow-Up: At [facility], you and your health needs are our priority.  As part of our continuing mission to provide you with exceptional heart care, we have created designated Provider Care Teams.  These Care Teams include your primary Cardiologist (physician) and Advanced Practice Providers (APPs -  Physician Assistants and Nurse Practitioners) who all work together to provide you with the care you need, when you need it.  We recommend signing up for the patient portal called "MyChart".  Sign up information is provided on this After Visit Summary.  MyChart is used to connect with patients for Virtual Visits (Telemedicine).  Patients are able to view lab/test results, encounter notes, upcoming appointments, etc.  Non-urgent messages can be sent to your provider as well.   To learn more about what you can do with MyChart, go to [email or web address].    Your next appointment:   1 year(s)  The format for your next appointment:   In Person  Provider:   [place]

## 2022-11-15 NOTE — Progress Notes (Signed)
Patient Care Team: Seward Carol, MD as PCP - General (Internal Medicine) Deboraha Sprang, MD as PCP - Electrophysiology (Cardiology) Ricard Dillon, MD as Consulting Physician (Internal Medicine) Deboraha Sprang, MD as Consulting Physician (Cardiology) Thompson Grayer, MD as Consulting Physician (Cardiology) [name], MD as Consulting Physician (Orthopedic Surgery) Regal, Tamala Fothergill, DPM as Consulting Physician (Podiatry) Malmfelt, Stephani Police, RN as Oncology Nurse Navigator Samuella Cota, MD as Consulting Physician (Family Medicine) Eppie Gibson, MD as Attending Physician (Radiation Oncology)   HPI  [name] is a 87 y.o. male Seen in followup for pacemaker implanted 2012-single-chamber VVI, with ventricular over sensing noted  He has atrial fibrillation and had a stroke/TIA 2014 DATE TEST EF   4/15 Echo   55-65 %   7/23 Echo    60-65 % LAE severe MAC severe  MR mod              He is s/p  AKA 2015   Hospitalization 7/23 with acute confusion found to have discitis of thoracic spine, MRI precluded by his pacemaker.  Behavior apparently resolved spontaneously with a negative EEG and CT scan.  Blood cultures negative x 2 treated with 6 weeks of empiric antibiotics  No dyspnea chest pain or edema Some nighttime nocturnal drainage which I believe to one of the events required urgent suction  Has a history of squamous cell cancer for which he is undergoing XRT  Date Cr K Hgb  5/18 1.61  10.1  7/23 1.82 3.7<,3.2 10.6              Thromboembolic risk factors ( age -62, HTN-1, DM-1, TIA/CVA-2,) for a CHADSVASc Score of 6     Past Medical History:  Diagnosis Date   Atrial fibrillation ([name])    permanent   Backache, unspecified    Cellulitis and abscess of leg, except foot    CHF (congestive heart failure) ([place])    Diverticulosis 05-2006   Dr. [name]    Esophageal stricture    Fatigue    GERD (gastroesophageal reflux disease)    Gout, unspecified     Hx of colonic polyps 05-2006   (Adenomatous)Dr. [name]   Hx of radiation therapy 06/16/14- 07/18/14   mult squamous cell carcinomas of scalp   Hypertension    Long term (current) use of anticoagulants    Overweight(278.02)    Pacemaker- [name] 02/20/2013   Phlebitis and thrombophlebitis of superficial vessels of lower extremities    Right bundle branch block    Skin cancer    squamous cell carcinoma scalp   Syncope and collapse    Tachycardia-bradycardia ([place])    s/p PPM   Type II or unspecified type diabetes mellitus without mention of complication, not stated as uncontrolled    Unspecified venous (peripheral) insufficiency     Past Surgical History:  Procedure Laterality Date   ABOVE KNEE LEG AMPUTATION Right 04/02/2014   DR DUDA   AMPUTATION Right 01/20/2014   Procedure: AMPUTATION BELOW KNEE- right;  Surgeon: [name], MD;  Location: [place];  Service: Orthopedics;  Laterality: Right;  Right Below Knee Amputation   AMPUTATION Right 02/14/2014   Procedure: AMPUTATION BELOW KNEE;  Surgeon: [name], MD;  Location: [place];  Service: Orthopedics;  Laterality: Right;  Right Below Knee Amputation Revision   AMPUTATION Right 03/07/2014   Procedure: AMPUTATION BELOW KNEE;  Surgeon: [name], MD;  Location: [place];  Service: Orthopedics;  Laterality: Right;  Right Below Knee Amputation Revision   AMPUTATION Right 03/18/2014   Procedure: revision of  AMPUTATION BELOW KNEE;  Surgeon: [name], MD;  Location: [place];  Service: Orthopedics;  Laterality: Right;  Revision Right Below Knee Amputation   AMPUTATION Right 04/02/2014   Procedure: AMPUTATION ABOVE KNEE;  Surgeon: [name], MD;  Location: [place];  Service: Orthopedics;  Laterality: Right;  Right Above Knee Amputation   EYE SURGERY     I & D EXTREMITY Right 01/17/2014   Procedure: IRRIGATION AND DEBRIDEMENT RIGHT HEEL  WITH CULTURES AND BONE BIOPSY, placement of wound vac;  Surgeon: Theodoro Kos, DO;  Location: [place];   Service: Plastics;  Laterality: Right;   LAMINECTOMY     LUMBAR FUSION     PACEMAKER INSERTION  8/12   SJM by [name] for tachy/brady syndrome   ROTATOR CUFF REPAIR     ruptured rt rectus muscle     SKIN BIOPSY     scalp    Current Outpatient Medications  Medication Sig Dispense Refill   ACCU-CHEK SOFTCLIX LANCETS lancets Use to check blood sugar three times daily. Dx E11.9 300 each 3   apixaban (ELIQUIS) 2.5 MG TABS tablet Take 2.5 mg by mouth 2 (two) times daily.     B-D ULTRAFINE III SHORT PEN 31G X 8 MM MISC USE AS DIRECTED 100 each 11   baclofen (LIORESAL) 10 MG tablet Take 10 mg by mouth every 8 (eight) hours.     Blood Glucose Calibration (ACCU-CHEK AVIVA) SOLN Use as Directed. Dx E11.9 3 each 3   diltiazem (CARDIZEM CD) 180 MG 24 hr capsule Take 180 mg by mouth daily.     furosemide (LASIX) 20 MG tablet Take 20 mg by mouth every other day.     gabapentin (NEURONTIN) 100 MG capsule Take 100 mg by mouth 2 (two) times daily.     glucose blood (ACCU-CHEK AVIVA PLUS) test strip DX E11.9, check blood sugar three times daily 300 each 11   insulin glargine (LANTUS) 100 unit/mL SOPN Inject 10 Units into the skin at bedtime.     insulin lispro (HUMALOG) 100 UNIT/ML KwikPen Inject into the skin.     melatonin 5 MG TABS Take 5 mg by mouth at bedtime.     Multiple Vitamins-Minerals (MULTIVITAMIN PO) Take 1 tablet by mouth daily. Takes a Men's 50 Plus Multivitamin     omeprazole (PRILOSEC) 20 MG capsule Take 20 mg by mouth daily.     No current facility-administered medications for this visit.    Allergies  Allergen Reactions   Morphine Rash and Other (See Comments)    Irritability also   Statins Other (See Comments)    dizzy    Review of Systems negative except from HPI and PMH  Physical Exam BP 138/62   Pulse 73   Ht 5' 9.5" (1.765 m)   Wt 153 lb (69.4 kg)   SpO2 98%   BMI 22.27 kg/m    Well developed and well nourished in no acute distress HENT normal Neck suppl   Clear Device pocket well healed; without hematoma or erythema.  There is no tethering  Regular rate and rhythm, no  murmur Abd-soft with active BS No Clubbing cyanosis  edema RBKA  Skin-warm and dry A & Oriented  Grossly normal sensory and motor function  ECG atrial fibrillation at 73 Interval-/14/42 Right bundle branch block  Device not reprogrammed  See paceart for details   Assessment and  Plan  Atrial fib-permanent  Renal insufficiency grade 3    Pacemaker     Chronic Anemia  Permanent afib   adequate rate control No bleeding on Apixaban   \ Last BMET hadnewly nomralized K  will recheck

## 2022-11-16 LAB — BASIC METABOLIC PANEL
BUN/Creatinine Ratio: 36 — ABNORMAL HIGH (ref 10–24)
BUN: 69 mg/dL — ABNORMAL HIGH (ref 8–27)
CO2: 20 mmol/L (ref 20–29)
Calcium: 8.8 mg/dL (ref 8.6–10.2)
Chloride: 102 mmol/L (ref 96–106)
Creatinine, Ser: 1.93 mg/dL — ABNORMAL HIGH (ref 0.76–1.27)
Glucose: 186 mg/dL — ABNORMAL HIGH (ref 70–99)
Potassium: 4.4 mmol/L (ref 3.5–5.2)
Sodium: 138 mmol/L (ref 134–144)
eGFR: 34 mL/min/{1.73_m2} — ABNORMAL LOW (ref >59)

## 2023-04-22 DEATH — deceased
